# Patient Record
Sex: Male | Born: 1940 | Race: Black or African American | Hispanic: No | Marital: Married | Smoking: Never smoker
Health system: Southern US, Community
[De-identification: ages and names within clinical notes are randomized; demographics above are authoritative.]

## PROBLEM LIST (undated history)

## (undated) DIAGNOSIS — B351 Tinea unguium: Secondary | ICD-10-CM

## (undated) DIAGNOSIS — R011 Cardiac murmur, unspecified: Secondary | ICD-10-CM

## (undated) DIAGNOSIS — I10 Essential (primary) hypertension: Secondary | ICD-10-CM

## (undated) DIAGNOSIS — I351 Nonrheumatic aortic (valve) insufficiency: Secondary | ICD-10-CM

## (undated) DIAGNOSIS — E785 Hyperlipidemia, unspecified: Secondary | ICD-10-CM

## (undated) DIAGNOSIS — Z952 Presence of prosthetic heart valve: Secondary | ICD-10-CM

## (undated) DIAGNOSIS — K8020 Calculus of gallbladder without cholecystitis without obstruction: Secondary | ICD-10-CM

## (undated) DIAGNOSIS — R112 Nausea with vomiting, unspecified: Secondary | ICD-10-CM

## (undated) DIAGNOSIS — I7789 Other specified disorders of arteries and arterioles: Secondary | ICD-10-CM

## (undated) DIAGNOSIS — H409 Unspecified glaucoma: Secondary | ICD-10-CM

## (undated) DIAGNOSIS — Z9889 Other specified postprocedural states: Secondary | ICD-10-CM

## (undated) DIAGNOSIS — K219 Gastro-esophageal reflux disease without esophagitis: Secondary | ICD-10-CM

## (undated) DIAGNOSIS — K2970 Gastritis, unspecified, without bleeding: Secondary | ICD-10-CM

## (undated) DIAGNOSIS — I2510 Atherosclerotic heart disease of native coronary artery without angina pectoris: Secondary | ICD-10-CM

## (undated) DIAGNOSIS — G4730 Sleep apnea, unspecified: Secondary | ICD-10-CM

## (undated) DIAGNOSIS — E66813 Obesity, class 3: Secondary | ICD-10-CM

## (undated) DIAGNOSIS — I509 Heart failure, unspecified: Secondary | ICD-10-CM

## (undated) DIAGNOSIS — E876 Hypokalemia: Secondary | ICD-10-CM

## (undated) DIAGNOSIS — B9681 Helicobacter pylori [H. pylori] as the cause of diseases classified elsewhere: Secondary | ICD-10-CM

## (undated) DIAGNOSIS — D509 Iron deficiency anemia, unspecified: Secondary | ICD-10-CM

## (undated) HISTORY — PX: CHOLECYSTECTOMY: SHX55

## (undated) HISTORY — DX: Atherosclerotic heart disease of native coronary artery without angina pectoris: I25.10

## (undated) HISTORY — DX: Nonrheumatic aortic (valve) insufficiency: I35.1

## (undated) HISTORY — PX: PROSTATE BIOPSY: SHX241

## (undated) HISTORY — PX: CARDIAC CATHETERIZATION: SHX172

## (undated) HISTORY — DX: Hyperlipidemia, unspecified: E78.5

## (undated) HISTORY — DX: Obesity, class 3: E66.813

## (undated) HISTORY — DX: Helicobacter pylori (H. pylori) as the cause of diseases classified elsewhere: B96.81

## (undated) HISTORY — DX: Calculus of gallbladder without cholecystitis without obstruction: K80.20

## (undated) HISTORY — DX: Helicobacter pylori (H. pylori) as the cause of diseases classified elsewhere: K29.70

## (undated) HISTORY — DX: Other specified disorders of arteries and arterioles: I77.89

## (undated) HISTORY — DX: Heart failure, unspecified: I50.9

## (undated) HISTORY — DX: Tinea unguium: B35.1

## (undated) HISTORY — PX: MULTIPLE TOOTH EXTRACTIONS: SHX2053

## (undated) HISTORY — DX: Iron deficiency anemia, unspecified: D50.9

## (undated) HISTORY — DX: Sleep apnea, unspecified: G47.30

## (undated) HISTORY — PX: ADRENALECTOMY: SHX876

## (undated) HISTORY — DX: Unspecified glaucoma: H40.9

## (undated) HISTORY — DX: Hypokalemia: E87.6

## (undated) HISTORY — DX: Gastro-esophageal reflux disease without esophagitis: K21.9

## (undated) HISTORY — DX: Morbid (severe) obesity due to excess calories: E66.01

---

## 2007-08-30 ENCOUNTER — Encounter: Payer: Self-pay | Admitting: Internal Medicine

## 2008-03-04 ENCOUNTER — Encounter: Payer: Self-pay | Admitting: Internal Medicine

## 2008-09-28 ENCOUNTER — Encounter: Payer: Self-pay | Admitting: Internal Medicine

## 2008-10-09 ENCOUNTER — Encounter: Payer: Self-pay | Admitting: Internal Medicine

## 2008-10-10 ENCOUNTER — Emergency Department (HOSPITAL_BASED_OUTPATIENT_CLINIC_OR_DEPARTMENT_OTHER): Admission: EM | Admit: 2008-10-10 | Discharge: 2008-10-10 | Payer: Self-pay | Admitting: Emergency Medicine

## 2008-10-10 ENCOUNTER — Encounter (INDEPENDENT_AMBULATORY_CARE_PROVIDER_SITE_OTHER): Payer: Self-pay | Admitting: *Deleted

## 2008-10-10 ENCOUNTER — Ambulatory Visit: Payer: Self-pay | Admitting: Diagnostic Radiology

## 2008-11-04 ENCOUNTER — Encounter: Payer: Self-pay | Admitting: Internal Medicine

## 2008-11-04 HISTORY — PX: COLONOSCOPY W/ BIOPSIES AND POLYPECTOMY: SHX1376

## 2008-11-04 HISTORY — PX: UPPER GASTROINTESTINAL ENDOSCOPY: SHX188

## 2008-11-25 ENCOUNTER — Encounter: Payer: Self-pay | Admitting: Internal Medicine

## 2008-11-28 ENCOUNTER — Encounter: Payer: Self-pay | Admitting: Internal Medicine

## 2008-12-08 ENCOUNTER — Encounter: Payer: Self-pay | Admitting: Internal Medicine

## 2008-12-21 ENCOUNTER — Encounter: Payer: Self-pay | Admitting: Internal Medicine

## 2008-12-23 ENCOUNTER — Encounter: Payer: Self-pay | Admitting: Internal Medicine

## 2009-01-01 ENCOUNTER — Encounter: Payer: Self-pay | Admitting: Cardiology

## 2009-01-01 ENCOUNTER — Ambulatory Visit: Payer: Self-pay | Admitting: Internal Medicine

## 2009-01-01 DIAGNOSIS — R110 Nausea: Secondary | ICD-10-CM | POA: Insufficient documentation

## 2009-01-01 DIAGNOSIS — R634 Abnormal weight loss: Secondary | ICD-10-CM | POA: Insufficient documentation

## 2009-01-01 DIAGNOSIS — R609 Edema, unspecified: Secondary | ICD-10-CM | POA: Insufficient documentation

## 2009-01-01 DIAGNOSIS — R630 Anorexia: Secondary | ICD-10-CM | POA: Insufficient documentation

## 2009-01-01 DIAGNOSIS — R17 Unspecified jaundice: Secondary | ICD-10-CM | POA: Insufficient documentation

## 2009-01-01 LAB — CONVERTED CEMR LAB
ALT: 28 units/L (ref 0–53)
Albumin: 3.8 g/dL (ref 3.5–5.2)
Chloride: 106 meq/L (ref 96–112)
GFR calc non Af Amer: 65.04 mL/min (ref >60)
Hemoglobin, Urine: NEGATIVE
Ketones, ur: NEGATIVE mg/dL
Potassium: 3.7 meq/L (ref 3.5–5.1)
Sodium: 144 meq/L (ref 135–145)
Specific Gravity, Urine: 1.02 (ref 1.000–1.030)

## 2009-01-04 ENCOUNTER — Ambulatory Visit: Payer: Self-pay | Admitting: Cardiovascular Disease

## 2009-01-04 DIAGNOSIS — I509 Heart failure, unspecified: Secondary | ICD-10-CM | POA: Insufficient documentation

## 2009-01-04 DIAGNOSIS — R9431 Abnormal electrocardiogram [ECG] [EKG]: Secondary | ICD-10-CM | POA: Insufficient documentation

## 2009-01-13 ENCOUNTER — Ambulatory Visit: Payer: Self-pay

## 2009-01-13 ENCOUNTER — Encounter: Payer: Self-pay | Admitting: Cardiovascular Disease

## 2009-01-26 ENCOUNTER — Ambulatory Visit: Payer: Self-pay | Admitting: Cardiovascular Disease

## 2009-01-26 DIAGNOSIS — I712 Thoracic aortic aneurysm, without rupture, unspecified: Secondary | ICD-10-CM | POA: Insufficient documentation

## 2009-01-26 DIAGNOSIS — I429 Cardiomyopathy, unspecified: Secondary | ICD-10-CM | POA: Insufficient documentation

## 2009-01-26 DIAGNOSIS — I080 Rheumatic disorders of both mitral and aortic valves: Secondary | ICD-10-CM | POA: Insufficient documentation

## 2009-01-26 LAB — CONVERTED CEMR LAB
Basophils Absolute: 0.4 10*3/uL — ABNORMAL HIGH (ref 0.0–0.1)
CO2: 34 meq/L — ABNORMAL HIGH (ref 19–32)
Calcium: 9.1 mg/dL (ref 8.4–10.5)
Creatinine, Ser: 0.8 mg/dL (ref 0.4–1.5)
Eosinophils Absolute: 0.2 10*3/uL (ref 0.0–0.7)
Glucose, Bld: 98 mg/dL (ref 70–99)
HCT: 43 % (ref 39.0–52.0)
Hemoglobin: 14.6 g/dL (ref 13.0–17.0)
Lymphocytes Relative: 25.2 % (ref 12.0–46.0)
MCHC: 34 g/dL (ref 30.0–36.0)
Neutro Abs: 4.1 10*3/uL (ref 1.4–7.7)
Potassium: 3.3 meq/L — ABNORMAL LOW (ref 3.5–5.1)
Pro B Natriuretic peptide (BNP): 1898 pg/mL — ABNORMAL HIGH (ref 0.0–100.0)
Prothrombin Time: 12.7 s (ref 10.9–13.3)
RBC: 4.66 M/uL (ref 4.22–5.81)
Sodium: 143 meq/L (ref 135–145)
aPTT: 32.1 s — ABNORMAL HIGH (ref 21.7–28.8)

## 2009-02-01 ENCOUNTER — Ambulatory Visit: Payer: Self-pay | Admitting: Cardiovascular Disease

## 2009-02-01 LAB — CONVERTED CEMR LAB
Calcium: 9.1 mg/dL (ref 8.4–10.5)
Chloride: 106 meq/L (ref 96–112)
Potassium: 3.5 meq/L (ref 3.5–5.1)

## 2009-02-02 ENCOUNTER — Inpatient Hospital Stay (HOSPITAL_BASED_OUTPATIENT_CLINIC_OR_DEPARTMENT_OTHER): Admission: RE | Admit: 2009-02-02 | Discharge: 2009-02-02 | Payer: Self-pay | Admitting: Cardiovascular Disease

## 2009-02-02 ENCOUNTER — Ambulatory Visit: Payer: Self-pay | Admitting: Cardiovascular Disease

## 2009-02-16 ENCOUNTER — Ambulatory Visit: Payer: Self-pay | Admitting: Cardiology

## 2009-02-17 ENCOUNTER — Encounter: Payer: Self-pay | Admitting: Nurse Practitioner

## 2009-02-17 ENCOUNTER — Ambulatory Visit: Payer: Self-pay | Admitting: Internal Medicine

## 2009-02-17 DIAGNOSIS — I359 Nonrheumatic aortic valve disorder, unspecified: Secondary | ICD-10-CM | POA: Insufficient documentation

## 2009-02-17 DIAGNOSIS — I428 Other cardiomyopathies: Secondary | ICD-10-CM | POA: Insufficient documentation

## 2009-03-01 ENCOUNTER — Telehealth: Payer: Self-pay | Admitting: Internal Medicine

## 2009-03-11 ENCOUNTER — Ambulatory Visit: Payer: Self-pay | Admitting: Cardiovascular Disease

## 2009-03-11 DIAGNOSIS — I119 Hypertensive heart disease without heart failure: Secondary | ICD-10-CM | POA: Insufficient documentation

## 2009-03-25 ENCOUNTER — Telehealth: Payer: Self-pay | Admitting: Internal Medicine

## 2009-04-05 ENCOUNTER — Ambulatory Visit: Payer: Self-pay | Admitting: Internal Medicine

## 2009-04-05 DIAGNOSIS — R739 Hyperglycemia, unspecified: Secondary | ICD-10-CM | POA: Insufficient documentation

## 2009-04-05 DIAGNOSIS — I10 Essential (primary) hypertension: Secondary | ICD-10-CM | POA: Insufficient documentation

## 2009-05-31 ENCOUNTER — Ambulatory Visit: Payer: Self-pay | Admitting: Internal Medicine

## 2009-05-31 LAB — CONVERTED CEMR LAB
ALT: 13 units/L (ref 0–53)
Alkaline Phosphatase: 75 units/L (ref 39–117)
Bilirubin, Direct: 0.4 mg/dL — ABNORMAL HIGH (ref 0.0–0.3)
CO2: 28 meq/L (ref 19–32)
Chloride: 102 meq/L (ref 96–112)
Cholesterol: 149 mg/dL (ref 0–200)
Creatinine, Ser: 0.95 mg/dL (ref 0.40–1.50)
Glucose, Bld: 101 mg/dL — ABNORMAL HIGH (ref 70–99)
Indirect Bilirubin: 1.2 mg/dL — ABNORMAL HIGH (ref 0.0–0.9)
LDL Cholesterol: 88 mg/dL (ref 0–99)
Total Bilirubin: 1.6 mg/dL — ABNORMAL HIGH (ref 0.3–1.2)
Total CHOL/HDL Ratio: 2.9
Triglycerides: 50 mg/dL (ref <150)

## 2009-06-07 ENCOUNTER — Ambulatory Visit: Payer: Self-pay | Admitting: Internal Medicine

## 2009-06-07 DIAGNOSIS — J310 Chronic rhinitis: Secondary | ICD-10-CM | POA: Insufficient documentation

## 2009-06-29 ENCOUNTER — Encounter (INDEPENDENT_AMBULATORY_CARE_PROVIDER_SITE_OTHER): Payer: Self-pay | Admitting: *Deleted

## 2009-06-29 ENCOUNTER — Telehealth: Payer: Self-pay | Admitting: Internal Medicine

## 2009-07-01 ENCOUNTER — Encounter: Payer: Self-pay | Admitting: Cardiovascular Disease

## 2009-07-01 ENCOUNTER — Ambulatory Visit: Payer: Self-pay

## 2009-07-01 ENCOUNTER — Ambulatory Visit: Payer: Self-pay | Admitting: Cardiovascular Disease

## 2009-07-01 ENCOUNTER — Ambulatory Visit (HOSPITAL_COMMUNITY): Admission: RE | Admit: 2009-07-01 | Discharge: 2009-07-01 | Payer: Self-pay | Admitting: Cardiovascular Disease

## 2009-07-05 ENCOUNTER — Ambulatory Visit: Payer: Self-pay | Admitting: Cardiovascular Disease

## 2009-07-06 ENCOUNTER — Telehealth: Payer: Self-pay | Admitting: Cardiovascular Disease

## 2009-07-07 ENCOUNTER — Telehealth: Payer: Self-pay | Admitting: Cardiovascular Disease

## 2009-10-01 ENCOUNTER — Encounter (INDEPENDENT_AMBULATORY_CARE_PROVIDER_SITE_OTHER): Payer: Self-pay | Admitting: *Deleted

## 2009-10-01 ENCOUNTER — Telehealth: Payer: Self-pay | Admitting: Cardiovascular Disease

## 2009-11-01 ENCOUNTER — Ambulatory Visit: Payer: Self-pay | Admitting: Cardiovascular Disease

## 2009-11-03 LAB — CONVERTED CEMR LAB
BUN: 11 mg/dL (ref 6–23)
Calcium: 8.5 mg/dL (ref 8.4–10.5)
Creatinine, Ser: 1 mg/dL (ref 0.4–1.5)

## 2009-11-15 ENCOUNTER — Telehealth: Payer: Self-pay | Admitting: Cardiovascular Disease

## 2009-11-29 ENCOUNTER — Telehealth: Payer: Self-pay | Admitting: Internal Medicine

## 2009-11-29 ENCOUNTER — Ambulatory Visit: Payer: Self-pay | Admitting: Internal Medicine

## 2009-11-29 DIAGNOSIS — E66813 Obesity, class 3: Secondary | ICD-10-CM | POA: Insufficient documentation

## 2009-11-29 DIAGNOSIS — E669 Obesity, unspecified: Secondary | ICD-10-CM | POA: Insufficient documentation

## 2009-11-29 LAB — CONVERTED CEMR LAB: Hgb A1c MFr Bld: 5.6 % (ref 4.6–6.1)

## 2009-12-21 ENCOUNTER — Ambulatory Visit (HOSPITAL_COMMUNITY): Admission: RE | Admit: 2009-12-21 | Discharge: 2009-12-21 | Payer: Self-pay | Admitting: Cardiovascular Disease

## 2009-12-21 ENCOUNTER — Ambulatory Visit: Payer: Self-pay | Admitting: Cardiovascular Disease

## 2009-12-21 ENCOUNTER — Ambulatory Visit: Payer: Self-pay

## 2009-12-21 ENCOUNTER — Telehealth: Payer: Self-pay | Admitting: Adult Health

## 2009-12-21 ENCOUNTER — Ambulatory Visit: Payer: Self-pay | Admitting: Cardiology

## 2009-12-21 ENCOUNTER — Encounter (INDEPENDENT_AMBULATORY_CARE_PROVIDER_SITE_OTHER): Payer: Self-pay

## 2009-12-21 ENCOUNTER — Encounter: Payer: Self-pay | Admitting: Cardiovascular Disease

## 2009-12-22 ENCOUNTER — Ambulatory Visit: Payer: Self-pay | Admitting: Cardiovascular Disease

## 2009-12-22 LAB — CONVERTED CEMR LAB
Calcium: 9 mg/dL (ref 8.4–10.5)
Creatinine, Ser: 1 mg/dL (ref 0.4–1.5)
GFR calc non Af Amer: 95.61 mL/min (ref >60)

## 2009-12-23 ENCOUNTER — Encounter: Payer: Self-pay | Admitting: Cardiovascular Disease

## 2009-12-23 ENCOUNTER — Ambulatory Visit: Payer: Self-pay | Admitting: Internal Medicine

## 2009-12-23 LAB — CONVERTED CEMR LAB
BUN: 13 mg/dL (ref 6–23)
GFR calc non Af Amer: 85.65 mL/min (ref >60)
Potassium: 3.4 meq/L — ABNORMAL LOW (ref 3.5–5.1)
Sodium: 142 meq/L (ref 135–145)

## 2009-12-24 ENCOUNTER — Ambulatory Visit: Payer: Self-pay | Admitting: Cardiovascular Disease

## 2009-12-30 ENCOUNTER — Telehealth: Payer: Self-pay | Admitting: Cardiovascular Disease

## 2009-12-30 ENCOUNTER — Encounter: Payer: Self-pay | Admitting: Cardiovascular Disease

## 2009-12-30 LAB — CONVERTED CEMR LAB
BUN: 17 mg/dL (ref 6–23)
CO2: 25 meq/L (ref 19–32)
Chloride: 105 meq/L (ref 96–112)
Potassium: 4.3 meq/L (ref 3.5–5.1)
Sodium: 140 meq/L (ref 135–145)

## 2010-01-13 ENCOUNTER — Telehealth: Payer: Self-pay | Admitting: Internal Medicine

## 2010-01-18 ENCOUNTER — Ambulatory Visit: Payer: Self-pay | Admitting: Cardiovascular Disease

## 2010-01-18 DIAGNOSIS — I2510 Atherosclerotic heart disease of native coronary artery without angina pectoris: Secondary | ICD-10-CM | POA: Insufficient documentation

## 2010-01-21 ENCOUNTER — Ambulatory Visit: Payer: Self-pay | Admitting: Thoracic Surgery (Cardiothoracic Vascular Surgery)

## 2010-01-21 ENCOUNTER — Encounter: Payer: Self-pay | Admitting: Internal Medicine

## 2010-01-26 ENCOUNTER — Encounter: Payer: Self-pay | Admitting: Internal Medicine

## 2010-01-26 ENCOUNTER — Encounter: Admission: RE | Admit: 2010-01-26 | Discharge: 2010-04-26 | Payer: Self-pay | Admitting: Internal Medicine

## 2010-02-16 ENCOUNTER — Ambulatory Visit: Payer: Self-pay | Admitting: Internal Medicine

## 2010-02-16 LAB — CONVERTED CEMR LAB
BUN: 16 mg/dL (ref 6–23)
CO2: 29 meq/L (ref 19–32)
Chloride: 102 meq/L (ref 96–112)
Potassium: 3.8 meq/L (ref 3.5–5.3)

## 2010-02-23 ENCOUNTER — Telehealth: Payer: Self-pay | Admitting: Internal Medicine

## 2010-02-28 ENCOUNTER — Encounter: Payer: Self-pay | Admitting: Internal Medicine

## 2010-02-28 ENCOUNTER — Telehealth: Payer: Self-pay | Admitting: Internal Medicine

## 2010-03-21 ENCOUNTER — Telehealth: Payer: Self-pay | Admitting: Internal Medicine

## 2010-04-20 ENCOUNTER — Ambulatory Visit: Payer: Self-pay | Admitting: Family

## 2010-04-20 ENCOUNTER — Encounter: Payer: Self-pay | Admitting: Internal Medicine

## 2010-04-20 LAB — CONVERTED CEMR LAB
CO2: 30 meq/L (ref 19–32)
Chloride: 104 meq/L (ref 96–112)
Creatinine, Ser: 0.99 mg/dL (ref 0.40–1.50)

## 2010-05-11 ENCOUNTER — Ambulatory Visit: Payer: Self-pay | Admitting: Internal Medicine

## 2010-05-19 ENCOUNTER — Ambulatory Visit: Payer: Self-pay | Admitting: Cardiovascular Disease

## 2010-05-20 ENCOUNTER — Ambulatory Visit: Payer: Self-pay | Admitting: Internal Medicine

## 2010-05-20 DIAGNOSIS — J40 Bronchitis, not specified as acute or chronic: Secondary | ICD-10-CM | POA: Insufficient documentation

## 2010-06-16 ENCOUNTER — Encounter
Admission: RE | Admit: 2010-06-16 | Discharge: 2010-09-14 | Payer: Self-pay | Source: Home / Self Care | Attending: Internal Medicine | Admitting: Internal Medicine

## 2010-09-07 ENCOUNTER — Ambulatory Visit
Admission: RE | Admit: 2010-09-07 | Discharge: 2010-09-07 | Payer: Self-pay | Source: Home / Self Care | Attending: Internal Medicine | Admitting: Internal Medicine

## 2010-09-07 LAB — CONVERTED CEMR LAB
BUN: 15 mg/dL (ref 6–23)
CO2: 30 meq/L (ref 19–32)
Chloride: 102 meq/L (ref 96–112)
Creatinine, Ser: 1 mg/dL (ref 0.40–1.50)
HDL: 38 mg/dL — ABNORMAL LOW (ref >39)
LDL Cholesterol: 85 mg/dL (ref 0–99)
Potassium: 4 meq/L (ref 3.5–5.3)
Pro B Natriuretic peptide (BNP): 130.6 pg/mL — ABNORMAL HIGH (ref 0.0–100.0)
TSH: 1.739 microintl units/mL (ref 0.350–4.500)
Triglycerides: 66 mg/dL (ref <150)
VLDL: 13 mg/dL (ref 0–40)

## 2010-09-08 ENCOUNTER — Telehealth: Payer: Self-pay | Admitting: Internal Medicine

## 2010-09-11 ENCOUNTER — Encounter: Payer: Self-pay | Admitting: Cardiovascular Disease

## 2010-09-12 ENCOUNTER — Ambulatory Visit
Admission: RE | Admit: 2010-09-12 | Discharge: 2010-09-12 | Payer: Self-pay | Source: Home / Self Care | Attending: Internal Medicine | Admitting: Internal Medicine

## 2010-09-12 DIAGNOSIS — R972 Elevated prostate specific antigen [PSA]: Secondary | ICD-10-CM | POA: Insufficient documentation

## 2010-09-20 ENCOUNTER — Encounter: Payer: Self-pay | Admitting: Internal Medicine

## 2010-09-20 NOTE — Progress Notes (Signed)
Summary: refill--Klor-Con  Phone Note Refill Request Message from:  Fax from [facility] [phone] on February 27, 2010 8:27 PM  Refills Requested: Medication #1:  KLOR-CON M20 20 MEQ CR-TABS 3  tabs by mouth  two times a day   Dosage confirmed as above?Dosage Confirmed   Supply Requested: 1 month   Last Refilled: 02/11/2010 Next Appointment Scheduled: 04/20/10--Rikki Trosper Initial call taken by: Mervin Kung CMA (AAMA),  February 28, 2010 2:56 PM    Prescriptions: KLOR-CON M20 20 MEQ CR-TABS (POTASSIUM CHLORIDE CRYS CR) 3  tabs by mouth  two times a day  #240 x 1   Entered by:   Mervin Kung CMA (AAMA)   Authorized by:   D. Thomos Lemons DO   Signed by:   Mervin Kung CMA (AAMA) on 02/28/2010   Method used:   Electronically to        [facility]  [facility] [phone]* (retail)       [address] [place], [place]  [address]       Ph: [phone]       Fax: [phone]   RxID:   [id number]

## 2010-09-20 NOTE — Letter (Signed)
Summary: Appointment - Cardiac MRI  [facility]     [place], [place]    Phone:   Fax:       October 01, 2009 MRN: [id number]   [facility] [place] [address] [place], [place]  [address]   Dear David Parsons,   We have scheduled the above patient for an appointment for a Cardiac MRI on 11-12-2009 at 9:00a.m.  Please refer to the below information for the location and instructions for this test:  Location:     [facility]       [address]       [place], [place]  [address] Instructions:    Wilmon Arms at [facility] Outpatient Registration 45 minutes prior to your appointment time.  This will ensure you are in the Radiology Department 30 minutes prior to your appointment.    There are no restrictions for this test you may eat and take medications as usual.  If you need to reschedule this appointment please call at the number listed above.    Sincerely,     Lorne Skeens  [facility] Scheduling Team

## 2010-09-20 NOTE — Assessment & Plan Note (Signed)
Summary: congestion/hea   Vital Signs:  Patient profile:   70 year old male O2 Sat:      98 % on Room air Temp:     97.7 degrees F oral Pulse rate:   77 / minute Pulse rhythm:   regular Resp:     20 per minute BP sitting:   132 / 60  (right arm) Cuff size:   large  Vitals Entered By: Glendell Docker CMA (May 20, 2010 10:53 AM)  O2 Flow:  Room air CC: Congestion Is Patient Diabetic? No Pain Assessment Patient in pain? no      Comments c/o head congestion and nasal draingage into throat , productive cough  clear to yellow in color, Tylenol and Chloricidin  with no relief, onset Saturday of last week, denies fever, no nausea or vomiting,.   Primary Care Marigene Erler:  Dondra Spry DO  CC:  Congestion.  History of Present Illness: 70 y/o AA male c/o cough onset 1 week head cold symptoms nasal congestion bed rest last night - cough started.  post nasal gtt sputum is clear, no purulent no fever or SOB  Allergies (verified): No Known Drug Allergies  Past History:  Past Medical History: hypokalemia Hypertension   Sleep apnea-occasionally wears CPAP at night gallstones s/p cholecystectomy  GERD Morbid obesity H. pylori gastritis (Tx Pylera 4/10) Aortic insufficiency-moderate Enlarged aortic root      a. 02/16/09 Chest CT:                The aortic root has a diameter measuring 4.6 cm, image 64 of the coronal series.  The ascending                            thoracic aorta has a diameter of 3.8 cm, image 65 of the coronal series.  At the level of the aortic arch                  the thoracic aorta has a maximum diameter of 3.2 cm.  The descending thoracic aorta has a maximum                   diameter of 3.2 cm.  There is no evidence for aortic dissection.     b. Repeat CT chest 12/23/09: Aortic root 5.5 cm, ascending aorta 4.0cm.  Dilated Cardiomyopathy/NICM      a. 01/2009 - Cath - nonobs. dzs.  EF 35%. Normal on last echo. Chronic Syst. 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   Ears:  R ear normal and L ear normal.   Mouth:  pharyngeal erythema and postnasal drip.   Neck:  No deformities, masses, or tenderness noted. Lungs:  normal respiratory effort.  faint coarse breath sounds bilaterally Heart:  normal rate, regular rhythm, and no gallop.     Impression & Recommendations:  Problem # 1:  BRONCHITIS (ICD-490) 70 y/o with early bronchitis.  take abx as directed.  Patient advised to call office if symptoms persist or worsen.  His updated medication list for this problem includes:    Cefuroxime Axetil 500 Mg Tabs (Cefuroxime axetil) ..... One by mouth two times a day  Complete Medication List: 1)  Klor-con M20 20 Meq Cr-tabs (Potassium chloride crys cr) .... 3  tabs by mouth  two times a day 2)  Hydralazine Hcl 50 Mg Tabs (Hydralazine hcl) .... Take 2 tabs every morning and 1 tab at bedtime 3)  Micardis Hct 80-25 Mg Tabs (Telmisartan-hctz) .Marland Kitchen.. 1 tablet by mouth once daily 4)  Furosemide 40 Mg Tabs (Furosemide) .Marland Kitchen.. 1 by mouth q am 5)  Centrum Ultra Mens Tabs (Multiple vitamins-minerals) .Marland Kitchen.. 1 tab once daily 6)  Aspirin 81 Mg Tbec (Aspirin) .... Take one tablet by mouth daily 7)  Carvedilol 25 Mg Tabs (Carvedilol) .Marland Kitchen.. 1  tab two times a day 8)  Cefuroxime Axetil 500 Mg Tabs (Cefuroxime axetil) .... One by mouth two times a day  Patient Instructions: 1)  Call our office if your symptoms do not  improve or gets worse. Prescriptions: CEFUROXIME AXETIL 500 MG TABS (CEFUROXIME AXETIL) one by mouth two times a day  #14 x 0   Entered and Authorized by:   D. Thomos Lemons DO   Signed by:   D. Thomos Lemons DO on 05/20/2010   Method used:   Electronically to        [facility]  [facility] [phone]* (retail)       [address] [place], [place]  [address]       Ph: [phone]       Fax:  [phone]   RxID:   [id number]   Current Allergies (reviewed today): No known allergies

## 2010-09-20 NOTE — Consult Note (Signed)
Summary: [facility]  [facility]   Imported By: Lanelle Bal 02/07/2010 11:37:29  _____________________________________________________________________  External Attachment:    Type:   Image     Comment:   External Document

## 2010-09-20 NOTE — Progress Notes (Signed)
Summary: can he get shingles shot anywhere in [place]   Phone Note Call from Patient Call back at [phone]   Caller: pt wife Steward Drone Call For: yoo  Summary of Call: [facility] told patient to check with [place] to see if he could get his shingles shot somewhere in the system as getting it at the drugstore it would cost $169.00  Initial call taken by: Roselle Locus,  November 29, 2009 9:36 AM  Follow-up for Phone Call        call placed to patient regardingg the vaccinatiion he states the pharmacy informed him that it would cost him $169 to have the vaccination given there, he would like to know if he could have the vaccine given at another [facility] location Follow-up by: Glendell Docker CMA,  November 29, 2009 11:00 AM  Additional Follow-up for Phone Call Additional follow up Details #1::        call placed to [facility] office to check protocol for shingles vaccination. I was informed by Jennifer,paitent would need to check with insurance company for coverage.  Call was returned to patient, he was advised to check with his insurance company for coverage, if vaccine is covered he was advised to call [facility] to had administered there Additional Follow-up by: Glendell Docker CMA,  November 29, 2009 11:09 AM

## 2010-09-20 NOTE — Miscellaneous (Signed)
Summary: med change  Clinical Lists Changes  Medications: Changed medication from KLOR-CON M20 20 MEQ CR-TABS (POTASSIUM CHLORIDE CRYS CR) 2 tabs by mouth  two times a day to KLOR-CON M20 20 MEQ CR-TABS (POTASSIUM CHLORIDE CRYS CR) 3  tabs by mouth  two times a day

## 2010-09-20 NOTE — Progress Notes (Signed)
Summary: returning call  Phone Note Call from Patient Call back at [phone]   Caller: Patient Reason for Call: Talk to Nurse Summary of Call: returning call  Initial call taken by: [name],  Dec 30, 2009 8:17 AM  Follow-up for Phone Call        Spoke with pt. He cannot do TEE on 5/17 but is available to have done on 5/18. Will arrange for 5/18 Dossie Arbour, RN, BSN  Dec 30, 2009 9:45 AM TEE scheduled for 01/05/10 with Dr. Jens Som. I spoke with wife and gave her this information. TEE instructions given verbally to wife and she verbalizes understanding. Will mail instructions to pt today. Follow-up by: Dossie Arbour, RN, BSN,  Dec 30, 2009 11:16 AM     Appended Document: returning call Received call from Dr.Brilyn Tuller that pt will not need TEE. Pt. notified. He will keep scheduled follow up appt with Dr. Clifton James on Jan 18, 2010. Clydie Braun in endoscopy notified and she will cancel TEE

## 2010-09-20 NOTE — Progress Notes (Signed)
Summary: questions about cancelled cardiac mri  Phone Note Call from Patient Call back at Home Phone [phone]   Caller: Patient Reason for Call: Talk to Nurse Summary of Call: Patient was scheduled for Cardiac MRI.  Was cancelled by the hospital.  Patient is unsure what happened and would like to know what the next step should be. Initial call taken by: Burnard Leigh,  November 15, 2009 10:26 AM  Follow-up for Phone Call        Spoke with radiology at [facility] ([facility] Due to his size pt was unable to fit in scanner and procedure was cancelled.  Dossie Arbour, RN, BSN  November 15, 2009 1:01 PM Called pt and let him know I would discuss with Dr. Clifton James and call him back in next few days with Dr. Gibson Ramp recommendations on how to proceed from here. Dossie Arbour, RN, BSN  November 15, 2009 5:26 PM   Additional Follow-up for Phone Call Additional follow up Details #1::        Unfortunate that patient is too large for MRI scanner. Can we set him up for an echo in the next few weeks and I can see him back in the office after the echo. thanks, cdm Additional Follow-up by: Verne Carrow, MD,  November 18, 2009 4:04 PM    Additional Follow-up for Phone Call Additional follow up Details #2::    left message to call back Dossie Arbour, RN, BSN  November 18, 2009 4:51 PM   returning call, [phone], [name]  November 19, 2009 8:58 AM  Spoke with pt. I let pt. know  Md would like to scheduled a 2-D echo in a few weeks.( Echo order is in EMR)  Dr. Clifton James will see him  the same day after echo is done. The  Scheduler will call pt with date and time. Okay with pt. Ollen Gross, RN, BSN  November 19, 2009 9:42 AM   Additional Follow-up for Phone Call Additional follow up Details #3:: Details for Additional Follow-up Action Taken: Agree Additional Follow-up by: Verne Carrow, MD,  November 22, 2009 12:14 PM

## 2010-09-20 NOTE — Assessment & Plan Note (Signed)
Summary: 6 month follow up/mhf   Vital Signs:  Patient profile:   70 year old male Height:      74 inches Weight:      346.50 pounds BMI:     44.65 O2 Sat:      97 % on Room air Temp:     98.0 degrees F oral Pulse rate:   91 / minute Pulse rhythm:   irregular Resp:     18 per minute BP sitting:   140 / 70  (right arm) Cuff size:   large  Vitals Entered By: Glendell Docker CMA (November 29, 2009 8:03 AM)  O2 Flow:  Room air CC: Rm 2- 6 Month Follow up disease managment Comments May 3 rd for Echo ,  no concerns   Primary Care Provider:  DThomos Lemons DO  CC:  Rm 2- 6 Month Follow up disease managment.  History of Present Illness:  70 yo AAM with non-ischemic CM, moderate AI, non-obstructive CAD , dilated aortic root and morbid obesity for followup  pt still struggling with obesity he gained wt since prev visit   Preventive Screening-Counseling & Management  Alcohol-Tobacco     Smoking Status: never  Allergies: No Known Drug Allergies  Past History:  Past Medical History: hypokalemia Hypertension  Sleep apnea-occasionally wears CPAP at night gallstones s/p cholecystectomy GERD Morbid obesity H. pylori gastritis (Tx Pylera 4/10) Aortic insufficiency-moderate Enlarged aortic root      a. 02/16/09 Chest CT:                The aortic root has a diameter measuring 4.6 cm, image 64 of the coronal series.  The ascending                            thoracic aorta has a diameter of 3.8 cm, image 65 of the coronal series.  At the level of the aortic arch                  the thoracic aorta has a maximum diameter of 3.2 cm.  The descending thoracic aorta has a maximum                   diameter of 3.2 cm.  There is no evidence for aortic dissection. Dilated Cardiomyopathy/NICM      a. 01/2009 - Cath - nonobs. dzs.  EF 35%. Chronic Syst. 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  Alcohol Use - no Illicit Drug Use - no Occupation: [employer]  Married,   42 years  no children    Physical Exam  General:  alert and overweight-appearing.   Neck:  No deformities, masses, or tenderness noted. Lungs:  normal respiratory effort and normal breath sounds.   Heart:  holosystolic murmur, normal rate, regular rhythm, and no gallop.   Extremities:  trace left pedal edema and trace right pedal edema.   Neurologic:  cranial nerves II-XII intact and gait normal.   Psych:  normally interactive, good eye contact, not anxious appearing, and not depressed appearing.     Impression & Recommendations:  Problem # 1:  OBESITY, MORBID (ICD-278.01) Refer for additional nutrition couseling / meal planning  Orders: Nutrition Referral (Nutrition)  Ht: 74 (11/29/2009)   Wt: 346.50 (11/29/2009)   BMI: 44.65 (11/29/2009)  Problem # 2:  OTHER ABNORMAL GLUCOSE (ICD-790.29) monitor A1c  Orders: T- Hemoglobin A1C (33295-18841)  Problem # 3:  HYPERTENSION (ICD-401.9) stable. Maintain current medication regimen.  His updated medication list for this problem includes:    Hydralazine Hcl 50 Mg Tabs (Hydralazine hcl) .Marland Kitchen... Take one tablet by mouth three times a day    Micardis Hct 80-25 Mg Tabs (Telmisartan-hctz) .Marland Kitchen... 1 tablet by mouth once daily    Furosemide 40 Mg Tabs (Furosemide) .Marland Kitchen... 1 by mouth q am    Carvedilol 25 Mg Tabs (Carvedilol) .Marland Kitchen... 1/2 tab two times a day  BP today: 140/70 Prior BP: 148/60 (07/05/2009)  Labs Reviewed: K+: 3.7 (11/01/2009) Creat: : 1.0 (11/01/2009)   Chol: 149  (05/31/2009)   HDL: 51 (05/31/2009)   LDL: 88 (05/31/2009)   TG: 50 (05/31/2009)  Complete Medication List: 1)  Klor-con M20 20 Meq Cr-tabs (Potassium chloride crys cr) .... 2 tabs by mouth  two times a day 2)  Hydralazine Hcl 50 Mg Tabs (Hydralazine hcl) .... Take one tablet by mouth three times a day 3)  Micardis Hct 80-25 Mg Tabs (Telmisartan-hctz) .Marland Kitchen.. 1 tablet by mouth once daily 4)  Furosemide 40 Mg Tabs (Furosemide) .Marland Kitchen.. 1 by mouth q am 5)  Centrum Ultra Mens Tabs (Multiple vitamins-minerals) .Marland Kitchen.. 1 tab once daily 6)  Aspirin 81 Mg Tbec (Aspirin) .... Take one tablet by mouth daily 7)  Carvedilol 25 Mg Tabs (Carvedilol) .... 1/2 tab two times a day 8)  Diazepam 5 Mg Tabs (Diazepam) .... Take 1 tablet am of mri and 1 tablet upon arrival to mri 9)  Zostavax 66063 Unt/0.94ml Solr (Zoster vaccine live) .... Administer vaccine x 1  Patient Instructions: 1)  Please schedule a follow-up appointment in 2 months. Prescriptions: ZOSTAVAX 01601 UNT/0.65ML SOLR (ZOSTER VACCINE LIVE) administer vaccine x 1  #1 x 0   Entered and Authorized by:   D. Thomos Lemons DO   Signed by:   D. Thomos Lemons DO on 11/29/2009   Method used:   Print then Give to Patient   RxID:   [id number]

## 2010-09-20 NOTE — Miscellaneous (Signed)
Summary: IV for Definity Echo  Clinical Lists Changes     IV 22 G (R) hand for Definity Echo. Terrence Wishon,RN.

## 2010-09-20 NOTE — Procedures (Signed)
Summary: [facility]  [facility]   Imported By: Sherian Rein 01/25/2009 14:40:39  _____________________________________________________________________  External Attachment:    Type:   Image     Comment:   External Document

## 2010-09-20 NOTE — Progress Notes (Signed)
Summary: Severe Skin Irritation  Phone Note Call from Patient Call back at [phone]   Caller: Spouse-Brenda  Call For: D. Thomos Lemons DO Summary of Call: patients wife called and left voice message stating patient has severe skin irritation. She state they  were out of town and the patient was bite by something. He has tried hydrocortiosone cream with no relief.  Call was returned to patient. He states that he has several mosquito bites all over his body and head, and has tried hydrocortisone cream with no relief. He states he is bothered by severe irritation on his arm and leg and would like to know if there is something else that he could try to get some relief. Initial call taken by: Glendell Docker CMA,  February 23, 2010 1:36 PM  Follow-up for Phone Call        He can try Benadryl 25mg  by mouth every 6 hours as needed.  Needs to be seen in office if this does not improve symptoms, or if swelling or redness surrounding "bites".   Follow-up by: Lemont Fillers FNP,  February 23, 2010 1:46 PM  Additional Follow-up for Phone Call Additional follow up Details #1::        call returne to patient at [phone], no answer, detailed voice message left informing patient per Sandford Craze instructions Additional Follow-up by: Glendell Docker CMA,  February 23, 2010 4:22 PM

## 2010-09-20 NOTE — Assessment & Plan Note (Signed)
Summary: 2 MONTH FOLLOW UP/MHF lm home machine--rm5   Vital Signs:  Patient profile:   70 year old male Height:      74 inches Weight:      347 pounds BMI:     44.71 Temp:     98.0 degrees F oral Pulse rate:   90 / minute Pulse rhythm:   regular Resp:     18 per minute BP sitting:   148 / 68  (right arm) Cuff size:   large  Vitals Entered By: Mervin Kung CMA David Parsons) (April 20, 2010 8:10 AM) CC: Room 5   2 month follow up. Pt states he can hear a crackle in his ear when he chews x 2 weeks., CHF Management Is Patient Diabetic? No Pain Assessment Patient in pain? no        Primary Care Provider:  Dondra Spry DO  CC:  Room 5   2 month follow up. Pt states he can hear a crackle in his ear when he chews x 2 weeks. and CHF Management.  History of Present Illness: David Parsons is a 70 year old male who presents today for his two month follow up visit.    1)CHF-  Reports that he has been compliant with his medications.  Taking one tablet of furosemide a day.    CHF History:      He complains of peripheral edema, but denies headache, chest pain, palpitations, dyspnea with exertion, orthopnea, visual symptoms, and neurologic problems.  Daily weights are being checked.  Other comments include: Notes that he develops ankle edema with prolonged sitting.  .     Allergies (verified): No Known Drug Allergies  Past History:  Past Medical History: Last updated: 02/16/2010 hypokalemia Hypertension   Sleep apnea-occasionally wears CPAP at night gallstones s/p cholecystectomy GERD Morbid obesity H. pylori gastritis (Tx Pylera 4/10) Aortic insufficiency-moderate Enlarged aortic root      a. 02/16/09 Chest CT:                The aortic root has a diameter measuring 4.6 cm, image 64 of the coronal series.  The ascending                            thoracic aorta has a diameter of 3.8 cm, image 65 of the coronal series.  At the level of the aortic arch                  the thoracic  aorta has a maximum diameter of 3.2 cm.  The descending thoracic aorta has a maximum                   diameter of 3.2 cm.  There is no evidence for aortic dissection.     b. Repeat CT chest 12/23/09: Aortic root 5.5 cm, ascending aorta 4.0cm.  Dilated Cardiomyopathy/NICM      a. 01/2009 - Cath - nonobs. dzs.  EF 35%. Normal on last echo. Chronic Syst. 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 No apparent alopecia or balding. Lungs:  Normal respiratory effort, chest expands symmetrically. Lungs are  clear to auscultation, no crackles or wheezes. Heart:  Normal rate and regular rhythm. S1 and S2 normal without gallop, murmur, click, rub or other extra sounds. Extremities:  1+ left pedal edema and 1+ right pedal edema.     Impression & Recommendations:  Problem # 1:  CONGESTIVE HEART FAILURE, UNSPECIFIED (ICD-428.0) Assessment Unchanged Weight remains stable, denies DOE.  Will plan to continue current meds.  Check BMET to f/u on potassium and renal fxn. His updated medication list for this problem includes:    Micardis Hct 80-25 Mg Tabs (Telmisartan-hctz) .Marland Kitchen... 1 tablet by mouth once daily    Furosemide 40 Mg Tabs (Furosemide) .Marland Kitchen... 1 by mouth q am    Aspirin 81 Mg Tbec (Aspirin) .Marland Kitchen... Take one tablet by mouth daily    Carvedilol 25 Mg Tabs (Carvedilol) .Marland Kitchen... 1  tab two times a day  Problem # 2:  HYPERTENSION (ICD-401.9) Assessment: Unchanged BP relatively unchanged.  Continue current meds for now.  If SBP remains greater than 140 next visit, consider adding additional BP med.   His updated medication list for this problem includes:    Hydralazine Hcl 50 Mg Tabs (Hydralazine hcl) .Marland Kitchen... Take  2 tabs every morning and 1 tab at bedtime    Micardis Hct 80-25 Mg Tabs (Telmisartan-hctz) .Marland Kitchen... 1 tablet by mouth once daily    Furosemide 40 Mg Tabs (Furosemide) .Marland Kitchen... 1 by mouth q am    Carvedilol 25 Mg Tabs (Carvedilol) .Marland Kitchen... 1  tab two times a day  BP today: 148/68 Prior BP: 142/60 (02/16/2010)  Labs Reviewed: K+: 3.8 (02/16/2010) Creat: : 0.97 (02/16/2010)   Chol: 149 (05/31/2009)   HDL: 51 (05/31/2009)   LDL: 88 (05/31/2009)   TG: 50 (05/31/2009)  Complete Medication List: 1)  Klor-con M20 20 Meq Cr-tabs (Potassium chloride crys cr) .... 3  tabs by mouth  two times a day 2)  Hydralazine Hcl 50 Mg Tabs (Hydralazine hcl) .... Take 2 tabs every morning and 1 tab at bedtime 3)  Micardis Hct 80-25 Mg Tabs (Telmisartan-hctz) .Marland Kitchen.. 1 tablet by mouth once daily 4)  Furosemide 40 Mg Tabs (Furosemide) .Marland Kitchen.. 1 by mouth q am 5)  Centrum Ultra Mens Tabs (Multiple vitamins-minerals) .Marland Kitchen.. 1 tab once daily 6)  Aspirin 81 Mg Tbec (Aspirin) .... Take one tablet by mouth daily 7)  Carvedilol 25 Mg Tabs (Carvedilol) .Marland Kitchen.. 1  tab two times a day  Other Orders: TLB-BMP (Basic Metabolic Panel-BMET) (80048-METABOL)  CHF Assessment/Plan:      The patient's current weight is 347 pounds.  His previous weight was 348.50 pounds.    Patient Instructions: 1)  Please complete your lab work on the first floor.  2)  Follow up with Dr. Artist Pais in 2 months- sooner if problems or concerns. Prescriptions: MICARDIS HCT 80-25 MG TABS (TELMISARTAN-HCTZ) 1 tablet by mouth once daily  #30 x 2   Entered and Authorized by:   Lemont Fillers FNP   Signed by:   Lemont Fillers FNP on 04/20/2010   Method used:   Electronically to        [facility] [phone]* (retail)       [address] [place], [place]  [address]       Ph: [phone]       Fax: [phone]   RxID:   [id number]   Current Allergies (reviewed today): No known allergies

## 2010-09-20 NOTE — Letter (Signed)
Summary: TEE Instructions  [facility], Main Office  [address]   [place], [place] [address]   Phone: [phone]  Fax: [phone]      TEE Instructions    You are scheduled for a TEE on  Jan 05, 2010 with Dr. Jens Som. ________________________.  Please arrive at the [facility] at 10:15 a.m. Marland Kitchen on the day of your procedure.  1)   Diet:       Nothing to eat or drink after midnight except your medications with        a sip of water.     2)  Must have a responsible person to drive you home.  3)   Bring your current insurance cards and current list of all your medications.   *Special Note:  Every effort is made to have your procedure done on time.  Occasionally there are emergencies that present themselves at the hospital that may cause delays.  Please be patient if a delay does occur.  *If you have any questions after you get home, please call the office at [phone].

## 2010-09-20 NOTE — Assessment & Plan Note (Signed)
Summary: 4 month rov/sl   Visit Type:  4 mo f/u Primary David Parsons:  David Spry DO  CC:  no cardiac complaints today.  History of Present Illness: 70 yo AAM with history of NICM, dilated aortic root, moderate AI, non-obstrucitve CAD, morbid obesity, obstructive sleep apnea, HTN and GERD who was  referred in May 2010 for SOB, abdominal swelling and LE edema. He has since been diagnosed with a non-ischemic CM, moderate AI, non-obstructive CAD and a dilated aortic root. He has undergone a right and left heart cath with non-obstructive coronary artery disease. A CT angiogram was performed in June 2010 to look closely at his aortic root and showed moderate dilation of the aortic root  (root 4.6 cm, ascending aorta 3.8cm). It is felt that his aortic insufficiency is likely related to the aortic root dilation. His LV is mildly dilated but LV function is now normal. Echo in the spring of 2011 showed enlarging aortic root. I planned on performing an MRI to look at his valve, degree of AI and size of root but he was too large for the scanner. Repeat CT angiogram chest 5/05/11showed enlarged aortic root  at 5.5cm with ascending aorta 4.0cm. Because of this, I referred him to Dr. Cornelius Moras with [facility]. Dr. Cornelius Moras recommended waiting for surgery until his AI worsened or his root enlarged more.    He is here today for follow up.  He has had no chest pain, SOB, DOE, palpitations, near syncope or syncope. He has been very active and walks several miles daily. He has been taking all of his medications. No complaints.    Current Medications (verified): 1)  Klor-Con M20 20 Meq Cr-Tabs (Potassium Chloride Crys Cr) .... 3  Tabs By Mouth  Two Times A Day 2)  Hydralazine Hcl 50 Mg Tabs (Hydralazine Hcl) .... Take 2 Tabs Every Morning and 1 Tab At Bedtime 3)  Micardis Hct 80-25 Mg Tabs (Telmisartan-Hctz) .Marland Kitchen.. 1 Tablet By Mouth Once Daily 4)  Furosemide 40 Mg  Tabs (Furosemide) .Marland Kitchen.. 1 By Mouth Q Am 5)  Centrum Ultra Mens  Tabs  (Multiple Vitamins-Minerals) .Marland Kitchen.. 1 Tab Once Daily 6)  Aspirin 81 Mg Tbec (Aspirin) .... Take One Tablet By Mouth Daily 7)  Carvedilol 25 Mg Tabs (Carvedilol) .Marland Kitchen.. 1  Tab Two Times A Day  Allergies (verified): No Known Drug Allergies  Past History:  Past Medical History: Reviewed history from 02/16/2010 and no changes required. hypokalemia Hypertension   Sleep apnea-occasionally wears CPAP at night gallstones s/p cholecystectomy GERD Morbid obesity H. pylori gastritis (Tx Pylera 4/10) Aortic insufficiency-moderate Enlarged aortic root      a. 02/16/09 Chest CT:                The aortic root has a diameter measuring 4.6 cm, image 64 of the coronal series.  The ascending                            thoracic aorta has a diameter of 3.8 cm, image 65 of the coronal series.  At the level of the aortic arch                  the thoracic aorta has a maximum diameter of 3.2 cm.  The descending thoracic aorta has a maximum                   diameter of 3.2 cm.  There is no evidence for  aortic dissection.     b. Repeat CT chest 12/23/09: Aortic root 5.5 cm, ascending aorta 4.0cm.  Dilated Cardiomyopathy/NICM      a. 01/2009 - Cath - nonobs. dzs.  EF 35%. Normal on last echo. Chronic Syst. CHF Non-obstructive CAD     Social History: Reviewed history from 02/16/2010 and no changes required. Patient has never smoked.  Alcohol Use - no Illicit Drug Use - no  Occupation: [employer]  Married,   42 years  no children    Review of Systems  The patient denies fatigue, malaise, fever, weight gain/loss, vision loss, decreased hearing, hoarseness, chest pain, palpitations, shortness of breath, prolonged cough, wheezing, sleep apnea, coughing up blood, abdominal pain, blood in stool, nausea, vomiting, diarrhea, heartburn, incontinence, blood in urine, muscle weakness, joint pain, leg swelling, rash, skin lesions, headache, fainting, dizziness, depression, anxiety, enlarged lymph  nodes, easy bruising or bleeding, and environmental allergies.    Vital Signs:  Patient profile:   70 year old male Height:      74 inches Weight:      345 pounds BMI:     44.46 Pulse rate:   86 / minute Pulse rhythm:   regular BP sitting:   142 / 76  (left arm) Cuff size:   large  Vitals Entered By: Danielle Rankin, CMA (May 19, 2010 9:08 AM)  Physical Exam  General:  General: Well developed, well nourished, NAD HEENT: OP clear, mucus membranes moist SKIN: warm, dry Neuro: No focal deficits Musculoskeletal: Muscle strength 5/5 all ext Psychiatric: Mood and affect normal Neck: No JVD, no carotid bruits, no thyromegaly, no lymphadenopathy. Lungs:Clear bilaterally, no wheezes, rhonci, crackles CV: RRR with soft diastolic  murmur, gallops rubs Abdomen: soft, NT, ND, BS present Extremities:Trace  lower ext  edema, pulses 2+.    Impression & Recommendations:  Problem # 1:  AORTIC INSUFFICIENCY, MODERATE (ICD-424.1)  Repeat echo in May.   His updated medication list for this problem includes:    Micardis Hct 80-25 Mg Tabs (Telmisartan-hctz) .Marland Kitchen... 1 tablet by mouth once daily    Furosemide 40 Mg Tabs (Furosemide) .Marland Kitchen... 1 by mouth q am    Carvedilol 25 Mg Tabs (Carvedilol) .Marland Kitchen... 1  tab two times a day  His updated medication list for this problem includes:    Micardis Hct 80-25 Mg Tabs (Telmisartan-hctz) .Marland Kitchen... 1 tablet by mouth once daily    Furosemide 40 Mg Tabs (Furosemide) .Marland Kitchen... 1 by mouth q am    Carvedilol 25 Mg Tabs (Carvedilol) .Marland Kitchen... 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 Plans to repeat CT angiogram thoracic aorta in May.   Orders: CT Scan  (CT Scan)  Problem # 3:  HYPERTENSION (ICD-401.9)  BP well controlled for him.   NO changes.   His updated medication list for this problem includes:    Hydralazine Hcl 50 Mg Tabs (Hydralazine hcl) .Marland Kitchen... Take 2 tabs every morning and 1  tab at bedtime    Micardis Hct 80-25 Mg Tabs (Telmisartan-hctz) .Marland Kitchen... 1 tablet by mouth once daily    Furosemide 40 Mg Tabs (Furosemide) .Marland Kitchen... 1 by mouth q am    Aspirin 81 Mg Tbec (Aspirin) .Marland Kitchen... Take one tablet by mouth daily    Carvedilol 25 Mg Tabs (Carvedilol) .Marland Kitchen... 1  tab two times a day  His updated medication list for this problem includes:  Hydralazine Hcl 50 Mg Tabs (Hydralazine hcl) .Marland Kitchen... Take 2 tabs every morning and 1 tab at bedtime    Micardis Hct 80-25 Mg Tabs (Telmisartan-hctz) .Marland Kitchen... 1 tablet by mouth once daily    Furosemide 40 Mg Tabs (Furosemide) .Marland Kitchen... 1 by mouth q am    Aspirin 81 Mg Tbec (Aspirin) .Marland Kitchen... Take one tablet by mouth daily    Carvedilol 25 Mg Tabs (Carvedilol) .Marland Kitchen... 1  tab two times a day  Patient Instructions: 1)  Your physician recommends that you schedule a follow-up appointment in 8 months. 2)  Your physician recommends that you continue on your current medications as directed. Please refer to the Current Medication list given to you today. 3)  Non-Cardiac CT Angiography (CTA), is a special type of CT scan that uses a computer to produce multi-dimensional views of major blood vessels throughout the body. In CT angiography, a contrast material is injected through an IV to help visualize the blood vessels. To be done in May. 4)  Your physician has requested that you have an echocardiogram.  Echocardiography is a painless test that uses sound waves to create images of your heart. It provides your doctor with information about the size and shape of your heart and how well your heart's chambers and valves are working.  This procedure takes approximately one hour. There are no restrictions for this procedure. To be done in May.

## 2010-09-20 NOTE — Assessment & Plan Note (Signed)
Summary: f/u echo done at 2pm   Visit Type:  f/u ECHO Primary Provider:  Dondra Spry DO  CC:  None.  History of Present Illness: 70 yo AAM with history of morbid obesity, obstructive sleep apnea, HTN and GERD who was  referred in May 2010 for SOB, abdominal swelling and LE edema. He has since been diagnosed with a non-ischemic CM, moderate AI, non-obstructive CAD and a dilated aortic root. He has undergone a right and left heart cath with non-obstructive coronary artery disease. A CT angiogram was performed to look closely at his aortic root and showed moderate dilation of the aortic root. It is felt that his aortic insufficiency is likely related to the aortic root dilation.  He is here today for follow up and to review his  echo that was performed this am.  He has been diuresed effectively and feels great. His lower extremity edema and abdominal bloating are  completely resolved. He has had no chest pain, SOB, DOE, palpitations, near syncope or syncope. He has been very active and walks several miles daily. He has been taking all of his medications. No complaints.   At the last visit, I ordered an MRA of the ascending aorta and heart but he could not fit into the MRI scanner secondary to size. Echo today with normal LV systolic function, moderate AI and enlarged aortic root/asc aorta. The aortic root size has increased since last echo.    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) 10)  Thoracic Aneurysm Without Mention of Rupture  (ICD-441.2) 11)  Cardiomyopathy, Secondary  (ICD-425.9) 12)  Congestive Heart Failure, Unspecified   (ICD-428.0) 13)  Nonspecific Abnormal Electrocardiogram  (ICD-794.31) 14)  Neoplasm, Malignant, Colon, Family Hx, Sibling  (ICD-V16.0) 15)  Hyperbilirubinemia  (ICD-782.4) 16)  Weight Loss  (ICD-783.21) 17)  Congestive Heart Failure, Biventricular  (ICD-428.40) 18)  Anasarca  (ICD-782.3) 19)  Anorexia, Chronic  (ICD-783.0) 20)  Nausea, Chronic  (ICD-787.02)  Current Medications (verified): 1)  Klor-Con M20 20 Meq Cr-Tabs (Potassium Chloride Crys Cr) .... 2 Tabs By Mouth  Two Times A Day 2)  Hydralazine Hcl 50 Mg Tabs (Hydralazine Hcl) .... Take One Tablet By Mouth Three Times A Day 3)  Micardis Hct 80-25 Mg Tabs (Telmisartan-Hctz) .Marland Kitchen.. 1 Tablet By Mouth Once Daily 4)  Furosemide 40 Mg  Tabs (Furosemide) .Marland Kitchen.. 1 By Mouth Q Am 5)  Centrum Ultra Mens  Tabs (Multiple Vitamins-Minerals) .Marland Kitchen.. 1 Tab Once Daily 6)  Aspirin 81 Mg Tbec (Aspirin) .... Take One Tablet By Mouth Daily 7)  Carvedilol 25 Mg Tabs (Carvedilol) .... 1/2 Tab Two Times A Day  Allergies (verified): No Known Drug Allergies  Past History:  Past Medical History: hypokalemia Hypertension  Sleep apnea-occasionally wears CPAP at night gallstones s/p cholecystectomy GERD Morbid obesity H. pylori gastritis (Tx Pylera 4/10) Aortic insufficiency-moderate Enlarged aortic root      a. 02/16/09 Chest CT:                The aortic root has a diameter measuring 4.6 cm, image 64 of the coronal series.  The ascending  thoracic aorta has a diameter of 3.8 cm, image 65 of the coronal series.  At the level of the aortic arch                  the thoracic aorta has a maximum diameter of 3.2 cm.  The descending thoracic aorta has a maximum                   diameter of 3.2 cm.  There is no evidence for aortic dissection. Dilated Cardiomyopathy/NICM      a. 01/2009 - Cath - nonobs. dzs.  EF 35%. Normal on last echo. Chronic Syst. CHF Non-obstructive CAD     Social History: Reviewed history from 11/29/2009 and no  changes required. Patient has never smoked.  Alcohol Use - no Illicit Drug Use - no Occupation: [employer]  Married,   42 years  no children    Review of Systems  The patient denies fatigue, malaise, fever, weight gain/loss, vision loss, decreased hearing, hoarseness, chest pain, palpitations, shortness of breath, prolonged cough, wheezing, sleep apnea, coughing up blood, abdominal pain, blood in stool, nausea, vomiting, diarrhea, heartburn, incontinence, blood in urine, muscle weakness, joint pain, leg swelling, rash, skin lesions, headache, fainting, dizziness, depression, anxiety, enlarged lymph nodes, easy bruising or bleeding, and environmental allergies.    Vital Signs:  Patient profile:   70 year old male Height:      74 inches Weight:      343 pounds BMI:     44.20 Pulse rate:   73 / minute BP sitting:   145 / 72 Cuff size:   large  Vitals Entered By: Oswald Hillock (Dec 21, 2009 10:52 AM)  Physical Exam  General:  General: Well developed, obese.  NAD HEENT: OP clear, mucus membranes moist SKIN: warm, dry Neuro: No focal deficits Musculoskeletal: Muscle strength 5/5 all ext Psychiatric: Mood and affect normal Neck: No JVD, no carotid bruits, no thyromegaly, no lymphadenopathy. Lungs:Clear bilaterally, no wheezes, rhonci, crackles CV: RRR with diastolic  murmur. No gallops rubs Abdomen: soft, NT, ND, BS present Extremities: No edema, pulses 2+.    Echocardiogram  Procedure date:  12/21/2009  Findings:      Left ventricle: The cavity size was mildly dilated. Wall thickness       was normal. Systolic function was normal. The estimated ejection       fraction was 55%. Although no diagnostic regional wall motion       abnormality was identified, this possibility cannot be completely       excluded on the basis of this study. Doppler parameters are       consistent with abnormal left ventricular relaxation (grade 1       diastolic  dysfunction).     - Aortic valve: Not well visualized. Probably trileaflet. Moderate       regurgitation.     - Aorta: The aortic root and ascending aorta were severely dilated.       Aortic root dimension:77mm (ED). Ascending aortic diameter: 59mm       (S).     - Mitral valve: No regurgitation.     - Left atrium: The atrium was mildly dilated.     - Pulmonary arteries: No complete TR doppler jet so unable to       estimate PA systolic pressure.     - Inferior vena cava: The vessel was normal in size; the  respirophasic diameter changes were in the normal range (= 50%);       findings are consistent with normal central venous pressure.     Impressions:            - Technically difficult study with poor acoustic windows. Mildly       dilated LV with normal systolic function, EF 55%. The aortic root       is dilated to 5.5 cm and the ascending aorta is dilated to 5.9 cm.       The aortic valve appears trileaflet with no stenosis but up to       moderate aortic insufficiency. The aortic valve and AI are not       well-visualized. Would confirm aortic dilatation with CTA chest       and would consider TEE to get a closer look at the aortic valve       prior to any possible aortic surgery.  Impression & Recommendations:  Problem # 1:  AORTIC INSUFFICIENCY, MODERATE (ICD-424.1) Stable with normal LV systolic function. His aortic root is enlarging. I have discussed possibility of surgery being needed in the near future. Will get CTA to size aorta. Will see him back and arrange TEE to visualize aortic valve.   His updated medication list for this problem includes:    Micardis Hct 80-25 Mg Tabs (Telmisartan-hctz) .Marland Kitchen... 1 tablet by mouth once daily    Furosemide 40 Mg Tabs (Furosemide) .Marland Kitchen... 1 by mouth q am    Carvedilol 25 Mg Tabs (Carvedilol) .Marland Kitchen... 1/2 tab two times a day  Orders: TLB-BMP (Basic Metabolic Panel-BMET) (80048-METABOL)  Problem # 2:  THORACIC ANEURYSM WITHOUT MENTION  OF RUPTURE (ICD-441.2)  See above.  Problem # 3:  HYPERTENSION (ICD-401.9) His BP has been well controlled at home. It is slightly elevated today but he did not take his meds today . No change.   His updated medication list for this problem includes:    Hydralazine Hcl 50 Mg Tabs (Hydralazine hcl) .Marland Kitchen... Take one tablet by mouth three times a day    Micardis Hct 80-25 Mg Tabs (Telmisartan-hctz) .Marland Kitchen... 1 tablet by mouth once daily    Furosemide 40 Mg Tabs (Furosemide) .Marland Kitchen... 1 by mouth q am    Aspirin 81 Mg Tbec (Aspirin) .Marland Kitchen... Take one tablet by mouth daily    Carvedilol 25 Mg Tabs (Carvedilol) .Marland Kitchen... 1/2 tab two times a day  Other Orders: CT Scan  (CT Scan)  Patient Instructions: 1)  Your physician recommends that you schedule a follow-up appointment in: 3-4 weeks 2)  Your physician recommends that you continue on your current medications as directed. Please refer to the Current Medication list given to you today. 3)  Non-Cardiac CT Angiography (CTA), is a special type of CT scan that uses a computer to produce multi-dimensional views of major blood vessels throughout the body. In CT angiography, a contrast material is injected through an IV to help visualize the blood vessels. Do not eat for 2 hours prior to this test

## 2010-09-20 NOTE — Assessment & Plan Note (Signed)
Summary: 2 MONTH FOLLOW UP/MHF   Vital Signs:  Patient profile:   70 year old male Weight:      341 pounds BMI:     43.94 O2 Sat:      98 % on Room air Temp:     97.1 degrees F oral Pulse rate:   72 / minute Pulse rhythm:   regular Resp:     18 per minute BP sitting:   122 / 60  (left arm) Cuff size:   Thigh  Vitals Entered By: Glendell Docker CMA (May 11, 2010 8:39 AM)  O2 Flow:  Room air CC: 2 month followup up Is Patient Diabetic? No Pain Assessment Patient in pain? no        Primary Care Provider:  Dondra Spry DO  CC:  2 month followup up.  History of Present Illness: 70 y/o AA male for f/u Htn - stable obesity and abnormal blood sugar good wt loss with dietary change   Preventive Screening-Counseling & Management  Alcohol-Tobacco     Smoking Status: never  Allergies (verified): No Known Drug Allergies  Past History:  Risk Factors: Alcohol Use: 0 (04/05/2009) Caffeine Use: 3 beverages per day (04/05/2009) Exercise: no (04/05/2009)  Past Medical History: hypokalemia  Hypertension   Sleep apnea-occasionally wears CPAP at night gallstones s/p cholecystectomy GERD  Morbid obesity  H. pylori gastritis (Tx Pylera 4/10) Aortic insufficiency-moderate Enlarged aortic root      a. 02/16/09 Chest CT:                The aortic root has a diameter measuring 4.6 cm, image 64 of the coronal series.  The ascending                            thoracic aorta has a diameter of 3.8 cm, image 65 of the coronal series.  At the level of the aortic arch                  the thoracic aorta has a maximum diameter of 3.2 cm.  The descending thoracic aorta has a maximum                   diameter of 3.2 cm.  There is no evidence for aortic dissection.     b. Repeat CT chest 12/23/09: Aortic root 5.5 cm, ascending aorta 4.0cm.  Dilated Cardiomyopathy/NICM      a. 01/2009 - Cath - nonobs. dzs.  EF 35%. Normal on last echo. Chronic Syst. 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  Alcohol Use - no  Illicit Drug Use - no  Occupation: [employer] and [employer]  Married,   42 years  no children    Physical Exam  General:  alert and overweight-appearing.   Lungs:  normal respiratory effort, normal breath sounds, and no wheezes.   Heart:  normal rate and regular rhythm.  SEM 2/6   Impression & Recommendations:  Problem #  1:  OBESITY, MORBID (ICD-278.01) Assessment Improved pt reports feeling much better after changing diet.  dietician was very helpful he lost 6 lbs encouraged pt to continue - low carb, high vegetable, low meat diet  Problem # 2:  HYPERTENSION (ICD-401.9) pt to monitor BP at home.   we discussed the possibility of tapering BP meds if significant wt loss results in lower BP His updated medication list for this problem includes:    Hydralazine Hcl 50 Mg Tabs (Hydralazine hcl) .Marland Kitchen... Take 2 tabs every morning and 1 tab at bedtime    Micardis Hct 80-25 Mg Tabs (Telmisartan-hctz) .Marland Kitchen... 1 tablet by mouth once daily    Furosemide 40 Mg Tabs (Furosemide) .Marland Kitchen... 1 by mouth q am    Carvedilol 25 Mg Tabs (Carvedilol) .Marland Kitchen... 1  tab two times a day  BP today: 122/60 Prior BP: 148/68 (04/20/2010)  Labs Reviewed: K+: 4.1 (04/20/2010) Creat: : 0.99 (04/20/2010)   Chol: 149 (05/31/2009)   HDL: 51 (05/31/2009)   LDL: 88 (05/31/2009)   TG: 50 (05/31/2009)  Complete Medication List: 1)  Klor-con M20 20 Meq Cr-tabs (Potassium chloride crys cr) .... 3  tabs by mouth  two times a  day 2)  Hydralazine Hcl 50 Mg Tabs (Hydralazine hcl) .... Take 2 tabs every morning and 1 tab at bedtime 3)  Micardis Hct 80-25 Mg Tabs (Telmisartan-hctz) .Marland Kitchen.. 1 tablet by mouth once daily 4)  Furosemide 40 Mg Tabs (Furosemide) .Marland Kitchen.. 1 by mouth q am 5)  Centrum Ultra Mens Tabs (Multiple vitamins-minerals) .Marland Kitchen.. 1 tab once daily 6)  Aspirin 81 Mg Tbec (Aspirin) .... Take one tablet by mouth daily 7)  Carvedilol 25 Mg Tabs (Carvedilol) .Marland Kitchen.. 1  tab two times a day  Other Orders: Influenza Vaccine MCR (96045) Flu Vaccine 2yrs + MEDICARE PATIENTS ([id number])  Patient Instructions: 1)  Please schedule a follow-up appointment in 4 months. 2)  BMP prior to visit, ICD-9:  401.9 3)  Please return for lab work one (1) week before your next appointment.   Current Allergies (reviewed today): No known allergies    Immunizations Administered:  Influenza Vaccine # 1:    Vaccine Type: Fluvax MCR    Site: left deltoid    Mfr: Gl[employer]oSmith[employer]    Dose: 0.5 ml    Route: IM    Given by: Glendell Docker CMA    Exp. Date: 02/18/2011    Lot #: [id number]    VIS given: 03/15/10 version given May 11, 2010.  Flu Vaccine Consent Questions:    Do you have a history of severe allergic reactions to this vaccine? no    Any prior history of allergic reactions to egg and/or gelatin? no    Do you have a sensitivity to the preservative Thimersol? no    Do you have a past history of Guillan-Barre Syndrome? no    Do you currently have an acute febrile illness? no    Have you ever had a severe reaction to latex? no    Vaccine information given and explained to patient? yes

## 2010-09-20 NOTE — Progress Notes (Signed)
  Phone Note From Pharmacy   Request: Speak with Provider Action Taken: Phone call completed Details for Reason: Abnormal Labs Summary of Call: Called by Darl Pikes with [facility] at [place] office concerning hypokalemia. [name] potassium was 2.8.  Called the patient to request he take an extra dose of potassium.  He is on 40 mEq two times a day.  He stated that he had not taken his potassium this morning prior to labs being drawn.  I advised patient to take his usual dose tonignt with one extra dose of 40 mEq in one hour.  He will follow-up in our office in am for labs to be drawn.  Office notified by phone call message. Initial call taken by: Joni Reining NP

## 2010-09-20 NOTE — Consult Note (Signed)
Summary: [facility]  [facility]   Imported By: Lester Ferdinand 02/08/2010 09:14:12  _____________________________________________________________________  External Attachment:    Type:   Image     Comment:   External Document

## 2010-09-20 NOTE — Progress Notes (Signed)
Summary: Furosemide Refill  Phone Note Refill Request Message from:  Fax from Pharmacy on March 21, 2010 11:48 AM  Refills Requested: Medication #1:  FUROSEMIDE 40 MG  TABS 1 by mouth q AM   Dosage confirmed as above?Dosage Confirmed   Brand Name Necessary? No   Supply Requested: 1 month   Last Refilled: 02/23/2010  Method Requested: Electronic Next Appointment Scheduled: 04/20/2010 @ 8 am Dr Artist Pais Initial call taken by: Glendell Docker CMA,  March 21, 2010 11:48 AM  Follow-up for Phone Call        ok to refill x3 Follow-up by: D. Thomos Lemons DO,  March 21, 2010 5:51 PM    Prescriptions: FUROSEMIDE 40 MG  TABS (FUROSEMIDE) 1 by mouth q AM  #30 x 3   Entered by:   Glendell Docker CMA   Authorized by:   D. Thomos Lemons DO   Signed by:   Glendell Docker CMA on 03/22/2010   Method used:   Electronically to        [facility]  [facility] [phone]* (retail)       [address] [place], [place]  [address]       Ph: [phone]       Fax: [phone]   RxID:   [id number]

## 2010-09-20 NOTE — Progress Notes (Signed)
Summary: Micardis Refill  Phone Note Refill Request Message from:  Fax from Pharmacy on Jan 13, 2010 11:44 AM  Refills Requested: Medication #1:  MICARDIS HCT 80-25 MG TABS 1 tablet by mouth once daily   Dosage confirmed as above?Dosage Confirmed   Brand Name Necessary? No   Supply Requested: 1 month  Method Requested: Electronic Next Appointment Scheduled: 02/07/2010 @ 8:15a Initial call taken by: Glendell Docker CMA,  Jan 13, 2010 11:48 AM    Prescriptions: MICARDIS HCT 80-25 MG TABS (TELMISARTAN-HCTZ) 1 tablet by mouth once daily  #30 x 2   Entered by:   Glendell Docker CMA   Authorized by:   D. Thomos Lemons DO   Signed by:   Glendell Docker CMA on 01/13/2010   Method used:   Electronically to        [facility]  [facility] [phone]* (retail)       [address] [place], [place]  [address]       Ph: [phone]       Fax: [phone]   RxID:   [id number]

## 2010-09-20 NOTE — Progress Notes (Signed)
Summary: BMP prior to cardiac MRI  Phone Note Outgoing Call   Call placed to: Patient Summary of Call: Pt will need BMP in March prior to cardiac MRI. Left message for him to call to set up time for this Initial call taken by: Dossie Arbour, RN, BSN,  October 01, 2009 1:38 PM  Follow-up for Phone Call        returning call, [name]  October 01, 2009 1:44 PM  Spoke with pt. He will come to office for lab work the week of March 14th. BMP ordered Follow-up by: Dossie Arbour, RN, BSN,  October 01, 2009 1:55 PM

## 2010-09-20 NOTE — Letter (Signed)
   [place] [facility] [address] [place], [place]  [address]  [place] Phone: [phone]      February 28, 2010   [facility] [address] [place], [place] [address]  RE:  LAB RESULTS  Dear  Mr. Calaway,  The following is an interpretation of your most recent lab tests.  Please take note of any instructions provided or changes to medications that have resulted from your lab work.  ELECTROLYTES:  Good - no changes needed  KIDNEY FUNCTION TESTS:  Good - no changes needed    DIABETIC STUDIES:  Good - no changes needed Blood Glucose: 112   HgbA1C: 5.3          Sincerely Yours,    Dr. Thomos Lemons

## 2010-09-20 NOTE — Assessment & Plan Note (Signed)
Summary: per check out/sf   Visit Type:  rov Primary Provider:  Dondra Spry DO  CC:  no cardiac complaints today.  History of Present Illness: 70 yo AAM with history of morbid obesity, obstructive sleep apnea, HTN and GERD who was  referred in May 2010 for SOB, abdominal swelling and LE edema. He has since been diagnosed with a non-ischemic CM, moderate AI, non-obstructive CAD and a dilated aortic root. He has undergone a right and left heart cath with non-obstructive coronary artery disease. A CT angiogram was performed in June 2010 to look closely at his aortic root and showed moderate dilation of the aortic root  (root 4.6 cm, ascending aorta 3.8cm). It is felt that his aortic insufficiency is likely related to the aortic root dilation. His LV is mildly dilated but LV function is now normal. Echo this spring showed enlarging aortic root. I planned on performing an MRI to look at his valve, degree of AI and size of root but he was too large for the scanner. Repeat CT angiogram chest 5/05/11showed enlarged aortic root  at 5.5cm with ascending aorta 4.0cm.    He is here today for follow up.  He has been diuresed effectively and feels great. His lower extremity edema and abdominal bloating are  completely resolved. He has had no chest pain, SOB, DOE, palpitations, near syncope or syncope. He has been very active and walks several miles daily. He has been taking all of his medications. No complaints.     Current Medications (verified): 1)  Klor-Con M20 20 Meq Cr-Tabs (Potassium Chloride Crys Cr) .... 3  Tabs By Mouth  Two Times A Day 2)  Hydralazine Hcl 50 Mg Tabs (Hydralazine Hcl) .... Take 2 Tabs Every Morning and 1 Tab At Bedtime 3)  Micardis Hct 80-25 Mg Tabs (Telmisartan-Hctz) .Marland Kitchen.. 1 Tablet By Mouth Once Daily 4)  Furosemide 40 Mg  Tabs (Furosemide) .Marland Kitchen.. 1 By Mouth Q Am 5)  Centrum Ultra Mens  Tabs (Multiple Vitamins-Minerals) .Marland Kitchen.. 1 Tab Once Daily 6)  Aspirin 81 Mg Tbec (Aspirin) .... Take  One Tablet By Mouth Daily 7)  Carvedilol 25 Mg Tabs (Carvedilol) .... 1/2 Tab Two Times A Day  Allergies (verified): No Known Drug Allergies  Past History:  Past Medical History: hypokalemia Hypertension  Sleep apnea-occasionally wears CPAP at night gallstones s/p cholecystectomy GERD Morbid obesity H. pylori gastritis (Tx Pylera 4/10) Aortic insufficiency-moderate Enlarged aortic root      a. 02/16/09 Chest CT:                The aortic root has a diameter measuring 4.6 cm, image 64 of the coronal series.  The ascending                            thoracic aorta has a diameter of 3.8 cm, image 65 of the coronal series.  At the level of the aortic arch                  the thoracic aorta has a maximum diameter of 3.2 cm.  The descending thoracic aorta has a maximum                   diameter of 3.2 cm.  There is no evidence for aortic dissection.     b. Repeat CT chest 12/23/09: Aortic root 5.5 cm, ascending aorta 4.0cm.  Dilated Cardiomyopathy/NICM      a. 01/2009 -  Cath - nonobs. dzs.  EF 35%. Normal on last echo. Chronic Syst. CHF Non-obstructive CAD     Past Surgical History: Reviewed history from 11/29/2009 and no changes required. 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 Family History of Breast Cancer:sisters Family History of Colon Cancer:brother   Prostate Cancer-brother Family History of Diabetes: 5 siblings Family History of Stomach Cancer:sister Mother-died at 96 from low potassium, HTN Father-died at 41 from CVA Sister died at age 11 from kidney failure      Social History: Reviewed history from 11/29/2009 and no changes required. Patient has never smoked.  Alcohol Use - no Illicit Drug Use - no Occupation: [employer]  Married,   42 years  no children    Review of Systems  The patient denies fatigue, malaise, fever,  weight gain/loss, vision loss, decreased hearing, hoarseness, chest pain, palpitations, shortness of breath, prolonged cough, wheezing, sleep apnea, coughing up blood, abdominal pain, blood in stool, nausea, vomiting, diarrhea, heartburn, incontinence, blood in urine, muscle weakness, joint pain, leg swelling, rash, skin lesions, headache, fainting, dizziness, depression, anxiety, enlarged lymph nodes, easy bruising or bleeding, and environmental allergies.    Vital Signs:  Patient profile:   70 year old male Height:      74 inches Weight:      346 pounds BMI:     44.58 Pulse rate:   85 / minute Pulse rhythm:   irregular BP sitting:   136 / 60  (left arm) Cuff size:   large  Vitals Entered By: Danielle Rankin, CMA (Jan 18, 2010 12:17 PM)  Physical Exam  General:  General: Pleasant, Well developed obese AAM,  NAD HEENT: OP clear, mucus membranes moist SKIN: warm, dry Neuro: No focal deficits Musculoskeletal: Muscle strength 5/5 all ext Psychiatric: Mood and affect normal Neck: No JVD, no carotid bruits, no thyromegaly, no lymphadenopathy. Lungs:Clear bilaterally, no wheezes, rhonci, crackles CV: RRR with slight diastolic  murmur, No gallops rubs Abdomen: soft, NT, ND, BS present Extremities: No edema, pulses 2+.    EKG  Procedure date:  01/18/2010  Findings:      Sinus rhythm, rate 85 bpm. LAD. QTc 478 msec.   CT Scan  Procedure date:  12/23/2009  Findings:      The aortic root measures 5.5 cm in diameter, unchanged,   image 51.  The ascending thoracic aorta measures 4.0 cm on image   47, unchanged.  Descending thoracic aorta is unremarkable.  There   is some mild, scattered atherosclerotic vascular disease.  No   aortic dissection.  No pulmonary embolus is identified.  No   axillary, hilar mediastinal lymphadenopathy.  Heart size is mildly   enlarged.  No pleural or pericardial effusion.  The lungs are   clear.    Incidentally imaged upper abdomen again seen is  surgical clips in   the right adrenal bed.  Visualized abdominal contents otherwise   unremarkable.  There is no focal bony abnormality.  Echocardiogram  Procedure date:  12/21/2009  Findings:      Left ventricle: The cavity size was mildly dilated. Wall thickness       was normal. Systolic function was normal. The estimated ejection       fraction was 55%. Although no diagnostic regional wall motion       abnormality was identified, this possibility cannot be completely  excluded on the basis of this study. Doppler parameters are       consistent with abnormal left ventricular relaxation (grade 1       diastolic dysfunction).     - Aortic valve: Not well visualized. Probably trileaflet. Moderate       regurgitation.     - Aorta: The aortic root and ascending aorta were severely dilated.       Aortic root dimension:103mm (ED). Ascending aortic diameter: 59mm       (S).     - Mitral valve: No regurgitation.     - Left atrium: The atrium was mildly dilated.     - Pulmonary arteries: No complete TR doppler jet so unable to       estimate PA systolic pressure.     - Inferior vena cava: The vessel was normal in size; the       respirophasic diameter changes were in the normal range (= 50%);       findings are consistent with normal central venous pressure.     Impressions:            - Technically difficult study with poor acoustic windows. Mildly       dilated LV with normal systolic function, EF 55%. The aortic root       is dilated to 5.5 cm and the ascending aorta is dilated to 5.9 cm.       The aortic valve appears trileaflet with no stenosis but up to       moderate aortic insufficiency. The aortic valve and AI are not       well-visualized. Would confirm aortic dilatation with CTA chest       and would consider TEE to get a closer look at the aortic valve       prior to any possible aortic surgery.  Impression & Recommendations:  Problem # 1:  AORTIC INSUFFICIENCY,  MODERATE (ICD-424.1) Moderate aortic valve insufficiency in setting of mildly dilated LV and known aortic root dilatation with recent enlargement. Will refer to Dr. Cornelius Moras  CT surgery for consideration for aortic root replacement with AVR. Mr. Cordial is in agreement and willing to pursue a surgical opinion.  He is overall a good surgical candidate with the exception of his obesity.   His updated medication list for this problem includes:    Micardis Hct 80-25 Mg Tabs (Telmisartan-hctz) .Marland Kitchen... 1 tablet by mouth once daily    Furosemide 40 Mg Tabs (Furosemide) .Marland Kitchen... 1 by mouth q am    Carvedilol 25 Mg Tabs (Carvedilol) .Marland Kitchen... 1/2 tab two times a day  Orders: TCTS Referral (TCTS Ref)  Problem # 2:  CARDIOMYOPATHY, DILATED (ICD-425.4) LV function now normal.  No changes.   His updated medication list for this problem includes:    Micardis Hct 80-25 Mg Tabs (Telmisartan-hctz) .Marland Kitchen... 1 tablet by mouth once daily    Furosemide 40 Mg Tabs (Furosemide) .Marland Kitchen... 1 by mouth q am    Aspirin 81 Mg Tbec (Aspirin) .Marland Kitchen... Take one tablet by mouth daily    Carvedilol 25 Mg Tabs (Carvedilol) .Marland Kitchen... 1/2 tab two times a day  His updated medication list for this problem includes:    Micardis Hct 80-25 Mg Tabs (Telmisartan-hctz) .Marland Kitchen... 1 tablet by mouth once daily    Furosemide 40 Mg Tabs (Furosemide) .Marland Kitchen... 1 by mouth q am    Aspirin 81 Mg Tbec (Aspirin) .Marland Kitchen... Take one tablet by mouth daily    Carvedilol 25 Mg Tabs (  Carvedilol) .Marland Kitchen... 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   Problem # 4:  HYPERTENSION (ICD-401.9) Well controlled on current therapy.   His updated medication list for this problem includes:    Hydralazine Hcl 50 Mg Tabs (Hydralazine hcl) .Marland Kitchen... Take 2 tabs every morning and 1 tab at bedtime    Micardis Hct 80-25 Mg Tabs (Telmisartan-hctz) .Marland Kitchen... 1 tablet by mouth once daily    Furosemide 40 Mg Tabs (Furosemide) .Marland Kitchen... 1 by mouth q  am    Aspirin 81 Mg Tbec (Aspirin) .Marland Kitchen... Take one tablet by mouth daily    Carvedilol 25 Mg Tabs (Carvedilol) .Marland Kitchen... 1/2 tab two times a day  His updated medication list for this problem includes:    Hydralazine Hcl 50 Mg Tabs (Hydralazine hcl) .Marland Kitchen... Take 2 tabs every morning and 1 tab at bedtime    Micardis Hct 80-25 Mg Tabs (Telmisartan-hctz) .Marland Kitchen... 1 tablet by mouth once daily    Furosemide 40 Mg Tabs (Furosemide) .Marland Kitchen... 1 by mouth q am    Aspirin 81 Mg Tbec (Aspirin) .Marland Kitchen... Take one tablet by mouth daily    Carvedilol 25 Mg Tabs (Carvedilol) .Marland Kitchen... 1/2 tab two times a day  Problem # 5:  CAD, NATIVE VESSEL (ICD-414.01) Stable. Mild disease by cath June 2010.   His updated medication list for this problem includes:    Aspirin 81 Mg Tbec (Aspirin) .Marland Kitchen... Take one tablet by mouth daily    Carvedilol 25 Mg Tabs (Carvedilol) .Marland Kitchen... 1/2 tab two times a day  His updated medication list for this problem includes:    Aspirin 81 Mg Tbec (Aspirin) .Marland Kitchen... Take one tablet by mouth daily    Carvedilol 25 Mg Tabs (Carvedilol) .Marland Kitchen... 1/2 tab two times a day  Patient Instructions: 1)  Your physician recommends that you schedule a follow-up appointment in: 4 months 2)  Your physician recommends that you continue on your current medications as directed. Please refer to the Current Medication list given to you today. 3)  You have been referred to Dr[facility] Prescriptions: HYDRALAZINE HCL 50 MG TABS (HYDRALAZINE HCL) take 2 tabs every morning and 1 tab at bedtime  #90 x 11   Entered by:   Danielle Rankin, CMA   Authorized by:   Verne Carrow, MD   Signed by:   Danielle Rankin, CMA on 01/18/2010   Method used:   Electronically to        [facility] [phone]* (retail)       [address] [place], [place]  [address]       Ph: [phone]       Fax: [phone]   RxID:   [id number]

## 2010-09-20 NOTE — Assessment & Plan Note (Signed)
Summary: 1 MONTH FOLLOW UP/MHF   Vital Signs:  Patient profile:   70 year old male Weight:      348.50 pounds BMI:     44.91 Temp:     97.7 degrees F oral Pulse rate:   80 / minute Pulse rhythm:   regular Resp:     20 per minute BP sitting:   142 / 60  (right arm) Cuff size:   large  Vitals Entered By: Glendell Docker CMA (February 16, 2010 10:50 AM) CC: Rm 2- 1 Month follow up Is Patient Diabetic? No Comments no concerns, medications reviewed no changes, follow with nutrition coordinator for wt management   Primary Care Provider:  D. Thomos Lemons DO  CC:  Rm 2- 1 Month follow up.  History of Present Illness:  Hypertension Follow-Up      This is a 70 year old man who presents for Hypertension follow-up.  The patient denies lightheadedness.  The patient denies the following associated symptoms: chest pain.  Compliance with medications (by patient report) has been near 100%.    vascular surgeon consult reviewed  Allergies (verified): No Known Drug Allergies  Past History:  Past Medical History: hypokalemia Hypertension   Sleep apnea-occasionally wears CPAP at night gallstones s/p cholecystectomy GERD Morbid obesity H. pylori gastritis (Tx Pylera 4/10) Aortic insufficiency-moderate Enlarged aortic root      a. 02/16/09 Chest CT:                The aortic root has a diameter measuring 4.6 cm, image 64 of the coronal series.  The ascending                            thoracic aorta has a diameter of 3.8 cm, image 65 of the coronal series.  At the level of the aortic arch                  the thoracic aorta has a maximum diameter of 3.2 cm.  The descending thoracic aorta has a maximum                   diameter of 3.2 cm.  There is no evidence for aortic dissection.     b. Repeat CT chest 12/23/09: Aortic root 5.5 cm, ascending aorta 4.0cm.  Dilated Cardiomyopathy/NICM      a. 01/2009 - Cath - nonobs. dzs.  EF 35%. Normal on last echo. Chronic Syst. 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  Alcohol Use - no Illicit Drug Use - no  Occupation: [employer] and [employer]  Married,   42 years  no children    Physical Exam  General:  alert and overweight-appearing.   Head:  normocephalic and atraumatic.   Neck:  No deformities, masses, or tenderness noted. Lungs:  normal respiratory effort and normal breath sounds.   Heart:  holosystolic murmur, normal rate, regular rhythm, and no gallop.   Extremities:  trace left pedal edema and trace right  pedal edema.     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 Cornelius Moras recommends close f/u q 6 months (2 D Echo) and continued medical therapy  Orders: T-Syphilis Test ([facility]PR) [phone]) T- * Misc. Laboratory test [phone])  Problem # 2:  HYPERTENSION (ICD-401.9) stable.  Maintain current medication regimen.  His updated medication list for this problem includes:    Hydralazine Hcl 50 Mg Tabs (Hydralazine hcl) .Marland Kitchen... Take 2 tabs every morning and 1 tab at bedtime    Micardis Hct 80-25 Mg Tabs (Telmisartan-hctz) .Marland Kitchen... 1 tablet by mouth once daily    Furosemide 40 Mg Tabs (Furosemide) .Marland Kitchen... 1 by mouth q am    Carvedilol 25 Mg Tabs (Carvedilol) .Marland Kitchen... 1  tab two times a day  Orders: T-Basic Metabolic Panel [phone])  BP today: 142/60 Prior BP: 136/60 (01/18/2010)  Labs Reviewed: K+: 4.3 (12/24/2009) Creat: : 0.9 (12/24/2009)   Chol: 149 (05/31/2009)   HDL: 51 (05/31/2009)   LDL: 88  (05/31/2009)   TG: 50 (05/31/2009)  Complete Medication List: 1)  Klor-con M20 20 Meq Cr-tabs (Potassium chloride crys cr) .... 3  tabs by mouth  two times a day 2)  Hydralazine Hcl 50 Mg Tabs (Hydralazine hcl) .... Take 2 tabs every morning and 1 tab at bedtime 3)  Micardis Hct 80-25 Mg Tabs (Telmisartan-hctz) .Marland Kitchen.. 1 tablet by mouth once daily 4)  Furosemide 40 Mg Tabs (Furosemide) .Marland Kitchen.. 1 by mouth q am 5)  Centrum Ultra Mens Tabs (Multiple vitamins-minerals) .Marland Kitchen.. 1 tab once daily 6)  Aspirin 81 Mg Tbec (Aspirin) .... Take one tablet by mouth daily 7)  Carvedilol 25 Mg Tabs (Carvedilol) .Marland Kitchen.. 1  tab two times a day  Other Orders: T- Hemoglobin A1C ([id number])  Patient Instructions: 1)  Please schedule a follow-up appointment in 2 months. 2)  The highlighted prescriptions were electronically sent to your pharmacy Prescriptions: CARVEDILOL 25 MG TABS (CARVEDILOL) 1  tab two times a day  #60 x 3   Entered and Authorized by:   D. Thomos Lemons DO   Signed by:   D. Thomos Lemons DO on 02/16/2010   Method used:   Electronically to        [facility]  [facility] [phone]* (retail)       [address] [place], [place]  [address]       Ph: [phone]       Fax: [phone]   RxID:   [id number]   Current Allergies (reviewed today): No known allergies

## 2010-09-22 NOTE — Progress Notes (Signed)
Summary: Lab Results  Phone Note Outgoing Call   Summary of Call: call pt - electrolytes and kidney function - normal.   cholesterol levels normal.  Thyroid blood test normal  PSA is elevated.  I suggest OV to discuss.  It would be helpful if he has any prev records re:  prior PSA values Initial call taken by: D. Thomos Lemons DO,  September 08, 2010 6:31 PM  Follow-up for Phone Call        call placed to patient at [phone], no answer. A voice  message was left for patient to return phone call Follow-up by: Glendell Docker CMA,  September 09, 2010 8:13 AM  Additional Follow-up for Phone Call Additional follow up Details #1::        patient returned phone call, he has been advised per Dr Artist Pais instruction. He states that he was previously seen by [facility] physician Dr Milderd Meager prior to seeing Dr Artist Pais. He states he was informed by Dr Della Goo that his PSA was elevated and was referred to Urology, however he does not rememeber the name of the Urologist that he has seen.  Call was placed to Dr Joelyn Oms office [facility]) at [phone], Patient has had only one PSA drawn at this office in July of 2009  resulted out at 3.37.  Patient is scheduled to follow up on Monday 09/12/2010 @ 3:15 pm Additional Follow-up by: Glendell Docker CMA,  September 09, 2010 3:55 PM

## 2010-09-28 NOTE — Assessment & Plan Note (Signed)
Summary: 4 MONTH FOLLOW UP/MHF   Vital Signs:  Patient profile:   70 year old [place] Height:      74 inches Weight:      337.50 pounds BMI:     43.49 O2 Sat:      99 % on Room air Temp:     97.4 degrees F oral Pulse rate:   77 / minute Resp:     18 per minute BP sitting:   142 / 70  (right arm) Cuff size:   large  Vitals Entered By: Glendell Docker CMA (September 07, 2010 7:58 AM)  O2 Flow:  Room air CC: 4 Month Follow up Is Patient Diabetic? No Pain Assessment Patient in pain? no      Comments no concerns, fasting for blood work   Primary Care Provider:  D. Thomos Lemons DO  CC:  4 Month Follow up.  History of Present Illness:  Hypertension Follow-Up      This is a 70 year old man who presents for Hypertension follow-up.  The patient reports edema, but denies lightheadedness.  The patient denies the following associated symptoms: chest pain.  Compliance with medications (by patient report) has been near 100%.  The patient reports that dietary compliance has been fair.    DM II borderline - fair dietary compliance.  some wt loss  Preventive Screening-Counseling & Management  Alcohol-Tobacco     Smoking Status: never  Allergies (verified): No Known Drug Allergies  Past History:  Past Medical History: hypokalemia Hypertension   Sleep apnea-occasionally wears CPAP at night gallstones s/p cholecystectomy  GERD Morbid obesity  H. pylori gastritis (Tx Pylera 4/10) Aortic insufficiency-moderate Enlarged aortic root      a. 02/16/09 Chest CT:                The aortic root has a diameter measuring 4.6 cm, image 64 of the coronal series.  The ascending                            thoracic aorta has a diameter of 3.8 cm, image 65 of the coronal series.  At the level of the aortic arch                  the thoracic aorta has a maximum diameter of 3.2 cm.  The descending thoracic aorta has a maximum                   diameter of 3.2 cm.  There is no evidence for aortic  dissection.     b. Repeat CT chest 12/23/09: Aortic root 5.5 cm, ascending aorta 4.0cm.  Dilated Cardiomyopathy/NICM      a. 01/2009 - Cath - nonobs. dzs.  EF 35%. Normal on last echo. Chronic Syst. CHF Non-obstructive CAD       Physical Exam  General:  alert and overweight-appearing.   Lungs:  normal respiratory effort and normal breath sounds.   Heart:  normal rate, regular rhythm, and no gallop.   Extremities:  1+ left pedal edema and 1+ right pedal edema.     Impression & Recommendations:  Problem # 1:  HYPERTENSION (ICD-401.9) Assessment Deteriorated increase lasix dose  His updated medication list for this problem includes:    Hydralazine Hcl 50 Mg Tabs (Hydralazine hcl) .Marland Kitchen... Take 2 tabs every morning and 1 tab at bedtime    Micardis Hct 80-25 Mg Tabs (Telmisartan-hctz) .Marland Kitchen... 1 tablet by mouth  once daily    Furosemide 40 Mg Tabs (Furosemide) .Marland Kitchen... 1 and 1/2 tab by mouth q am    Carvedilol 25 Mg Tabs (Carvedilol) .Marland Kitchen... 1  tab two times a day  BP today: 142/70 Prior BP: 132/60 (05/20/2010)  Labs Reviewed: K+: 4.1 (04/20/2010) Creat: : 0.99 (04/20/2010)   Chol: 149 (05/31/2009)   HDL: 51 (05/31/2009)   LDL: 88 (05/31/2009)   TG: 50 (05/31/2009)  Orders: T-Basic Metabolic Panel [id number]) T-Lipid Profile [phone]) T-TSH [phone] Orders: T-Basic Metabolic Panel [id number]) ... 10/10/2010  Problem # 2:  OTHER ABNORMAL GLUCOSE (ICD-790.29) Assessment: Improved pt with fair dietary compliance some wt loss  Labs Reviewed: Creat: 0.99 (04/20/2010)     Complete Medication List: 1)  Klor-con M20 20 Meq Cr-tabs (Potassium chloride crys cr) .... 3  tabs by mouth  two times a day 2)  Hydralazine Hcl 50 Mg Tabs (Hydralazine hcl) .... Take 2 tabs every morning and 1 tab at bedtime 3)  Micardis Hct 80-25 Mg Tabs (Telmisartan-hctz) .Marland Kitchen.. 1 tablet by mouth once daily 4)  Furosemide 40 Mg Tabs (Furosemide) .Marland Kitchen.. 1 and 1/2 tab by mouth q am 5)  Centrum Ultra  Mens Tabs (Multiple vitamins-minerals) .Marland Kitchen.. 1 tab once daily 6)  Aspirin 81 Mg Tbec (Aspirin) .... Take one tablet by mouth daily 7)  Carvedilol 25 Mg Tabs (Carvedilol) .Marland Kitchen.. 1  tab two times a day  Other Orders: T-BNP  (B Natriuretic Peptide) ([id number]) T-PSA ([id number])   Patient Instructions: 1)  Please limit your salt intake to 2 grams per day 2)  Please schedule a follow-up appointment in 2 months. Prescriptions: MICARDIS HCT 80-25 MG TABS (TELMISARTAN-HCTZ) 1 tablet by mouth once daily  #30 Tablet x 5   Entered and Authorized by:   D. Thomos Lemons DO   Signed by:   D. Thomos Lemons DO on 09/07/2010   Method used:   Electronically to        [facility] [phone]* (retail)       [address] [place], [place]  [address]       Ph: [phone]       Fax: [phone]   RxID:   [id number] FUROSEMIDE 40 MG  TABS (FUROSEMIDE) 1 and 1/2 tab by mouth q AM  #45 x 5   Entered and Authorized by:   D. Thomos Lemons DO   Signed by:   D. Thomos Lemons DO on 09/07/2010   Method used:   Electronically to        [facility]  [facility] [phone]* (retail)       [address] [place], [place]  [address]       Ph: [phone]       Fax: [phone]   RxID:   [id number]    Orders Added: 1)  T-Basic Metabolic Panel [id number] 2)  T-BNP  (B Natriuretic Peptide) [[id number]] 3)  T-Lipid Profile [80061-22930] 4)  T-TSH [61443-15400] 5)  T-PSA [86761-95093] 6)  T-Basic Metabolic Panel [[id number]] 7)  Est. Patient Level III [98338]     Current Allergies (reviewed today): No known allergies   Prevention & Chronic Care Immunizations   Influenza vaccine: Fluvax MCR  (05/11/2010)    Tetanus booster: 04/14/2008: Td    Pneumococcal vaccine: given  (04/16/2006)    H. zoster vaccine: Not documented  Colorectal Screening   Hemoccult: Not documented    Colonoscopy: normal  (10/06/2008)  Other Screening   PSA: Not documented   PSA ordered.   Smoking status: never   (09/07/2010)  Lipids   Total Cholesterol: 149  (05/31/2009)   LDL: 88  (05/31/2009)   LDL Direct: Not documented   HDL: 51  (05/31/2009)   Triglycerides: 50  (05/31/2009)  Hypertension   Last Blood Pressure: 142 / 70  (09/07/2010)   Serum creatinine: 0.99  (04/20/2010)   Serum potassium 4.1  (04/20/2010)  Self-Management Support :    Hypertension self-management support: Not documented

## 2010-10-06 NOTE — Consult Note (Signed)
Summary: [facility]  [facility]   Imported By: Maryln Gottron 09/29/2010 09:30:00  _____________________________________________________________________  External Attachment:    Type:   Image     Comment:   External Document

## 2010-10-06 NOTE — Letter (Signed)
Summary: [facility]  [facility]   Imported By: Lanelle Bal 09/28/2010 08:12:09  _____________________________________________________________________  External Attachment:    Type:   Image     Comment:   External Document

## 2010-10-06 NOTE — Assessment & Plan Note (Signed)
Summary: CONSULT DISCUSS ELEVATED PSA/DK   Vital Signs:  Patient profile:   70 year old male Height:      74 inches O2 Sat:      99 % on Room air Temp:     97.9 degrees F oral Pulse rate:   77 / minute Resp:     20 per minute BP sitting:   142 / 70  (right arm) Cuff size:   large  Vitals Entered By: Glendell Docker CMA (September 12, 2010 3:34 PM)  O2 Flow:  Room air  Primary Care Provider:  D. Thomos Lemons DO   History of Present Illness: 70 y/o AA male for f/u re:  elevated PSA  prev seen by PCP for elevated PSA - between 5-6 PSA followed.  no biopsies performed subsequent studies showed PSA mildly elevated 02/2008 - 3.37  pt not currently having any urinary symptoms  brother has hx of elevated PSA work up negative for cancer  Preventive Screening-Counseling & Management  Alcohol-Tobacco     Smoking Status: never  Allergies (verified): No Known Drug Allergies  Past History:  Past Medical History: hypokalemia Hypertension   Sleep apnea-occasionally wears CPAP at night gallstones s/p cholecystectomy  GERD Morbid obesity  H. pylori gastritis (Tx Pylera 4/10) Aortic insufficiency-moderate Enlarged aortic root      a. 02/16/09 Chest CT:                The aortic root has a diameter measuring 4.6 cm, image 64 of the coronal series.  The ascending                            thoracic aorta has a diameter of 3.8 cm, image 65 of the coronal series.  At the level of the aortic arch                  the thoracic aorta has a maximum diameter of 3.2 cm.  The descending thoracic aorta has a maximum                   diameter of 3.2 cm.  There is no evidence for aortic dissection.     b. Repeat CT chest 12/23/09: Aortic root 5.5 cm, ascending aorta 4.0cm.  Dilated Cardiomyopathy/NICM      a. 01/2009 - Cath - nonobs. dzs.  EF 35%. Normal on last echo. Chronic Syst. CHF Non-obstructive CAD     Family History: Family History of Breast Cancer:sisters Family History of Colon  Cancer:brother   Prostate Cancer-brother Family History of Diabetes: 5 siblings Family History of Stomach Cancer:sister Mother-died at 33 from low potassium, HTN Father-died at 71 from CVA Sister died at age 71 from kidney failure         Physical Exam  General:  alert, well-developed, and well-nourished.   Lungs:  normal respiratory effort and normal breath sounds.   Heart:  normal rate and regular rhythm.   Rectal:  no external abnormalities.   Prostate:  limited exam,  unable to fully palpate prostate gland   Impression & Recommendations:  Problem # 1:  PSA, INCREASED (ICD-790.93) PSA 7.32 arrange f/u with urology for mgt and surveillance  Orders: Urology Referral (Urology)  Complete Medication List: 1)  Klor-con M20 20 Meq Cr-tabs (Potassium chloride crys cr) .... 3  tabs by mouth  two times a day 2)  Hydralazine Hcl 50 Mg Tabs (Hydralazine hcl) .... Take 2 tabs every morning  and 1 tab at bedtime 3)  Micardis Hct 80-25 Mg Tabs (Telmisartan-hctz) .Marland Kitchen.. 1 tablet by mouth once daily 4)  Furosemide 40 Mg Tabs (Furosemide) .Marland Kitchen.. 1 and 1/2 tab by mouth q am 5)  Centrum Ultra Mens Tabs (Multiple vitamins-minerals) .Marland Kitchen.. 1 tab once daily 6)  Aspirin 81 Mg Tbec (Aspirin) .... Take one tablet by mouth daily 7)  Carvedilol 25 Mg Tabs (Carvedilol) .Marland Kitchen.. 1  tab two times a day  Patient Instructions: 1)  Our office will contract you re:  urology referral   Orders Added: 1)  Urology Referral [Urology] 2)  Est. Patient Level III [60454]    Current Allergies (reviewed today): No known allergies

## 2010-10-06 NOTE — Letter (Signed)
Summary: [facility]  [facility]   Imported By: Lanelle Bal 09/28/2010 08:11:13  _____________________________________________________________________  External Attachment:    Type:   Image     Comment:   External Document

## 2010-10-12 ENCOUNTER — Encounter: Payer: Self-pay | Admitting: Internal Medicine

## 2010-10-12 ENCOUNTER — Ambulatory Visit (INDEPENDENT_AMBULATORY_CARE_PROVIDER_SITE_OTHER): Payer: Medicare Other | Admitting: Internal Medicine

## 2010-10-12 DIAGNOSIS — I10 Essential (primary) hypertension: Secondary | ICD-10-CM

## 2010-10-17 ENCOUNTER — Other Ambulatory Visit: Payer: Self-pay | Admitting: Cardiovascular Disease

## 2010-10-17 DIAGNOSIS — I712 Thoracic aortic aneurysm, without rupture: Secondary | ICD-10-CM

## 2010-10-18 ENCOUNTER — Encounter: Payer: Self-pay | Admitting: Cardiovascular Disease

## 2010-10-18 ENCOUNTER — Other Ambulatory Visit: Payer: Self-pay | Admitting: Cardiovascular Disease

## 2010-10-18 ENCOUNTER — Other Ambulatory Visit (INDEPENDENT_AMBULATORY_CARE_PROVIDER_SITE_OTHER): Payer: Medicare Other

## 2010-10-18 DIAGNOSIS — I10 Essential (primary) hypertension: Secondary | ICD-10-CM

## 2010-10-18 LAB — BASIC METABOLIC PANEL
CO2: 31 mEq/L (ref 19–32)
Calcium: 8.9 mg/dL (ref 8.4–10.5)
Creatinine, Ser: 1 mg/dL (ref 0.4–1.5)
GFR: 97.63 mL/min (ref >60.00)
Glucose, Bld: 101 mg/dL — ABNORMAL HIGH (ref 70–99)
Sodium: 140 mEq/L (ref 135–145)

## 2010-10-19 ENCOUNTER — Other Ambulatory Visit: Payer: Medicare Other

## 2010-10-24 ENCOUNTER — Telehealth: Payer: Self-pay | Admitting: Internal Medicine

## 2010-11-01 NOTE — Assessment & Plan Note (Signed)
Summary: 1 MONTH FOLLOW UP /MHF--rm 3   Vital Signs:  Patient profile:   70 year old male Height:      74 inches Weight:      336 pounds BMI:     43.30 Temp:     97.8 degrees F oral Pulse rate:   78 / minute Pulse rhythm:   regular Resp:     18 per minute BP sitting:   152 / 74  (right arm) Cuff size:   large  Vitals Entered By: Mervin Kung CMA Duncan Dull) (October 12, 2010 9:06 AM) CC: pt here for 1 month follow up Is Patient Diabetic? No Pain Assessment Patient in pain? no        Primary Care Provider:  Dondra Spry DO  CC:  pt here for 1 month follow up.  History of Present Illness: 70 y/o male for follow up pt seen by urologist for elevated PSA DRE noted to be normal.  no nodules.  plan is to monitor PSA  he denies obstructive symptoms  htn - good med compliance   Preventive Screening-Counseling & Management  Alcohol-Tobacco     Alcohol drinks/day: 0     Alcohol Counseling: not indicated; patient does not drink     Smoking Status: never     Tobacco Counseling: not indicated; no tobacco use  Caffeine-Diet-Exercise     Caffeine use/day: 3 beverages per day     Caffeine Counseling: decrease use of caffeine     Does Patient Exercise: no  Allergies (verified): No Known Drug Allergies  Past History:  Past Medical History: hypokalemia Hypertension   Sleep apnea-occasionally wears CPAP at night gallstones s/p cholecystectomy   GERD  Morbid obesity  H. pylori gastritis (Tx Pylera 4/10) Aortic insufficiency-moderate Enlarged aortic root      a. 02/16/09 Chest CT:                The aortic root has a diameter measuring 4.6 cm, image 64 of the coronal series.  The ascending                            thoracic aorta has a diameter of 3.8 cm, image 65 of the coronal series.  At the level of the aortic arch                  the thoracic aorta has a maximum diameter of 3.2 cm.  The descending thoracic aorta has a maximum                   diameter of 3.2 cm.   There is no evidence for aortic dissection.     b. Repeat CT chest 12/23/09: Aortic root 5.5 cm, ascending aorta 4.0cm.  Dilated Cardiomyopathy/NICM      a. 01/2009 - Cath - nonobs. dzs.  EF 35%. Normal on last echo. Chronic Syst. CHF Non-obstructive CAD    PMH-FH-SH reviewed-no changes except otherwise noted  Physical Exam  General:  alert, well-developed, and well-nourished.   Lungs:  normal respiratory effort and normal breath sounds.   Heart:  normal rate and regular rhythm.   Extremities:  1+ left pedal edema and 1+ right pedal edema.     Impression & Recommendations:  Problem # 1:  PSA, INCREASED (ICD-790.93) Followed by urology  Problem # 2:  HYPERTENSION (ICD-401.9) Assessment: Deteriorated add amlodipine  His updated medication list for this problem includes:    Hydralazine Hcl  50 Mg Tabs (Hydralazine hcl) .Marland Kitchen... Take 2 tabs every morning and 1 tab at bedtime    Micardis Hct 80-25 Mg Tabs (Telmisartan-hctz) .Marland Kitchen... 1 tablet by mouth once daily    Furosemide 40 Mg Tabs (Furosemide) .Marland Kitchen... 1 and 1/2 tab by mouth q am    Carvedilol 25 Mg Tabs (Carvedilol) .Marland Kitchen... 1  tab two times a day    Amlodipine Besylate 5 Mg Tabs (Amlodipine besylate) ..... One by mouth once daily  BP today: 152/74 Prior BP: 142/70 (09/12/2010)  Labs Reviewed: K+: 4.0 (09/07/2010) Creat: : 1.00 (09/07/2010)   Chol: 136 (09/07/2010)   HDL: 38 (09/07/2010)   LDL: 85 (09/07/2010)   TG: 66 (09/07/2010)  Complete Medication List: 1)  Klor-con M20 20 Meq Cr-tabs (Potassium chloride crys cr) .... 3  tabs by mouth  two times a day 2)  Hydralazine Hcl 50 Mg Tabs (Hydralazine hcl) .... Take 2 tabs every morning and 1 tab at bedtime 3)  Micardis Hct 80-25 Mg Tabs (Telmisartan-hctz) .Marland Kitchen.. 1 tablet by mouth once daily 4)  Furosemide 40 Mg Tabs (Furosemide) .Marland Kitchen.. 1 and 1/2 tab by mouth q am 5)  Centrum Ultra Mens Tabs (Multiple vitamins-minerals) .Marland Kitchen.. 1 tab once daily 6)  Aspirin 81 Mg Tbec (Aspirin) .... Take one  tablet by mouth daily 7)  Carvedilol 25 Mg Tabs (Carvedilol) .Marland Kitchen.. 1  tab two times a day 8)  Amlodipine Besylate 5 Mg Tabs (Amlodipine besylate) .... One by mouth once daily  Patient Instructions: 1)  Please schedule a follow-up appointment in 3 months. 2)  Call our office in 2 weeks with home blood pressure readings. 3)  BMP prior to visit, ICD-9: 401.9 4)  Please return for lab work one (1) week before your next appointment.  Prescriptions: AMLODIPINE BESYLATE 5 MG TABS (AMLODIPINE BESYLATE) one by mouth once daily  #30 x 3   Entered and Authorized by:   D. Thomos Lemons DO   Signed by:   D. Thomos Lemons DO on 10/12/2010   Method used:   Electronically to        [facility]  [facility] [phone]* (retail)       [address] [place], [place]  [address]       Ph: [phone]       Fax: [phone]   RxID:   [id number]    Orders Added: 1)  Est. Patient Level III [24401]    Current Allergies (reviewed today): No known allergies

## 2010-11-01 NOTE — Progress Notes (Signed)
Summary: Status update on blood pressure  Phone Note Call from Patient Call back at Home Phone [phone]   Caller: Spouse- Steward Drone Call For: Dondra Spry DO Summary of Call: Patients wife Steward Drone called and left voice message to report patient blood pressure on March 1 was 144/58 Initial call taken by: Glendell Docker CMA,  October 24, 2010 11:16 AM  Follow-up for Phone Call        ptz have pt continue to monitor bp and call back after they obtain 4 additional readings (check BP once daily) Follow-up by: D. Thomos Lemons DO,  October 25, 2010 1:37 PM  Additional Follow-up for Phone Call Additional follow up Details #1::        call placed to patient at [phone], no answer. A detailed voice message was left advising patient per Dr Artist Pais instructions. Additional Follow-up by: Glendell Docker CMA,  October 25, 2010 2:52 PM

## 2010-11-04 ENCOUNTER — Ambulatory Visit (INDEPENDENT_AMBULATORY_CARE_PROVIDER_SITE_OTHER)
Admission: RE | Admit: 2010-11-04 | Discharge: 2010-11-04 | Disposition: A | Discharge status: Home / Self Care | Payer: Medicare Other | Source: Ambulatory Visit | Attending: Cardiovascular Disease | Admitting: Cardiovascular Disease

## 2010-11-04 DIAGNOSIS — I712 Thoracic aortic aneurysm, without rupture: Secondary | ICD-10-CM

## 2010-11-04 HISTORY — DX: Essential (primary) hypertension: I10

## 2010-11-04 MED ORDER — IOHEXOL 300 MG/ML  SOLN
100.0000 mL | Freq: Once | INTRAMUSCULAR | Status: AC | PRN
  Administered 2010-11-04: 100 mL via INTRAVENOUS

## 2010-11-07 ENCOUNTER — Telehealth: Payer: Self-pay | Admitting: Cardiovascular Disease

## 2010-11-17 NOTE — Progress Notes (Signed)
Summary: Pt returning call  Phone Note Call from Patient Call back at Home Phone [phone] [facility]     Caller: Patient Summary of Call: Pt returning call Initial call taken by: Judie Grieve,  November 07, 2010 8:44 AM  Follow-up for Phone Call        pt aware of CT Angiogram results. Whitney Maeola Sarah RN  November 07, 2010 9:00 AM  Follow-up by: Whitney Maeola Sarah RN,  November 07, 2010 9:00 AM

## 2010-11-28 ENCOUNTER — Telehealth (INDEPENDENT_AMBULATORY_CARE_PROVIDER_SITE_OTHER): Payer: Medicare Other | Admitting: Internal Medicine

## 2010-11-28 DIAGNOSIS — I10 Essential (primary) hypertension: Secondary | ICD-10-CM

## 2010-11-28 LAB — POCT I-STAT 3, VENOUS BLOOD GAS (G3P V)
Acid-Base Excess: 3 mmol/L — ABNORMAL HIGH (ref 0.0–2.0)
O2 Saturation: 62 %

## 2010-11-28 LAB — POCT I-STAT 3, ART BLOOD GAS (G3+)
Acid-Base Excess: 2 mmol/L (ref 0.0–2.0)
Bicarbonate: 25.9 mEq/L — ABNORMAL HIGH (ref 20.0–24.0)
O2 Saturation: 94 %
TCO2: 27 mmol/L (ref 0–100)
pO2, Arterial: 69 mmHg — ABNORMAL LOW (ref 80.0–100.0)

## 2010-11-28 MED ORDER — CARVEDILOL 25 MG PO TABS: 25.0000 mg | ORAL_TABLET | Freq: Two times a day (BID) | ORAL | Status: DC

## 2010-11-28 NOTE — Telephone Encounter (Signed)
rx refill sent to pharmacy 

## 2010-11-28 NOTE — Telephone Encounter (Signed)
Refill- carvedilol 25mg  tablet. Take 1 tablet by mouth twice a day. Qty 30. Last fill 3.3.12

## 2010-12-06 ENCOUNTER — Telehealth: Payer: Self-pay | Admitting: *Deleted

## 2010-12-06 ENCOUNTER — Telehealth (INDEPENDENT_AMBULATORY_CARE_PROVIDER_SITE_OTHER): Payer: Medicare Other | Admitting: Internal Medicine

## 2010-12-06 DIAGNOSIS — E876 Hypokalemia: Secondary | ICD-10-CM

## 2010-12-06 LAB — DIFFERENTIAL
Basophils Absolute: 0.1 10*3/uL (ref 0.0–0.1)
Eosinophils Relative: 3 % (ref 0–5)
Lymphocytes Relative: 29 % (ref 12–46)
Lymphs Abs: 1.7 10*3/uL (ref 0.7–4.0)
Monocytes Absolute: 0.7 10*3/uL (ref 0.1–1.0)

## 2010-12-06 LAB — COMPREHENSIVE METABOLIC PANEL
ALT: 39 U/L (ref 0–53)
AST: 33 U/L (ref 0–37)
Albumin: 3.6 g/dL (ref 3.5–5.2)
Chloride: 104 mEq/L (ref 96–112)
Creatinine, Ser: 1.1 mg/dL (ref 0.4–1.5)
GFR calc Af Amer: >60 mL/min (ref >60)
Sodium: 145 mEq/L (ref 135–145)
Total Bilirubin: 2.1 mg/dL — ABNORMAL HIGH (ref 0.3–1.2)

## 2010-12-06 LAB — URINE MICROSCOPIC-ADD ON

## 2010-12-06 LAB — URINALYSIS, ROUTINE W REFLEX MICROSCOPIC
Bilirubin Urine: NEGATIVE
Glucose, UA: NEGATIVE mg/dL
Ketones, ur: NEGATIVE mg/dL
pH: 6.5 (ref 5.0–8.0)

## 2010-12-06 LAB — CBC
MCV: 93 fL (ref 78.0–100.0)
Platelets: 256 10*3/uL (ref 150–400)
WBC: 6 10*3/uL (ref 4.0–10.5)

## 2010-12-06 MED ORDER — POTASSIUM CHLORIDE CRYS ER 20 MEQ PO TBCR: 20.0000 meq | EXTENDED_RELEASE_TABLET | Freq: Two times a day (BID) | ORAL | Status: DC

## 2010-12-06 NOTE — Telephone Encounter (Signed)
rx refill sent to pharmacy 

## 2010-12-06 NOTE — Telephone Encounter (Signed)
Patients wife called and left voice message stating patient has had body aches, chills and fever since Saturday, and does not seem to be feeling well. She wanted to know if patient should schedule office visit.  Call was returned to patient at [phone], no answer. A detailed voice message was left informing patient to schedule appointment for evaluation

## 2010-12-06 NOTE — Telephone Encounter (Signed)
Refill- klor-con *M** . Take 3 tablets by mouth twice a day. Qty 240.00. Last fill 3.18.12

## 2010-12-17 ENCOUNTER — Encounter: Payer: Self-pay | Admitting: Cardiovascular Disease

## 2010-12-17 ENCOUNTER — Encounter: Payer: Self-pay | Admitting: *Deleted

## 2010-12-19 ENCOUNTER — Telehealth (INDEPENDENT_AMBULATORY_CARE_PROVIDER_SITE_OTHER): Payer: Medicare Other | Admitting: Internal Medicine

## 2010-12-19 DIAGNOSIS — E876 Hypokalemia: Secondary | ICD-10-CM

## 2010-12-19 MED ORDER — POTASSIUM CHLORIDE CRYS ER 20 MEQ PO TBCR: EXTENDED_RELEASE_TABLET | ORAL | Status: DC

## 2010-12-19 NOTE — Telephone Encounter (Signed)
Pt states that he was originally taking potassium 3 in the morning and 3 at night. Pt states he refilled potassium and directions told him to take 1 in the morning and 1 at night. Pt wants to know which dosage he needs to take?

## 2010-12-19 NOTE — Telephone Encounter (Signed)
Call returned to patient at [phone], he was informed the Rx was corrected and re-submitted to pharmacy to reflect 3 tablets by mouth twice a day

## 2010-12-20 ENCOUNTER — Encounter: Payer: Self-pay | Admitting: Cardiovascular Disease

## 2010-12-20 ENCOUNTER — Ambulatory Visit (INDEPENDENT_AMBULATORY_CARE_PROVIDER_SITE_OTHER): Payer: Medicare Other | Admitting: Cardiovascular Disease

## 2010-12-20 VITALS — BP 158/70 | HR 73 | Resp 18 | Ht 73.0 in | Wt 332.4 lb

## 2010-12-20 DIAGNOSIS — I10 Essential (primary) hypertension: Secondary | ICD-10-CM

## 2010-12-20 DIAGNOSIS — I712 Thoracic aortic aneurysm, without rupture: Secondary | ICD-10-CM

## 2010-12-20 MED ORDER — AMLODIPINE BESYLATE 10 MG PO TABS: 10.0000 mg | ORAL_TABLET | Freq: Every day | ORAL | Status: DC

## 2010-12-20 NOTE — Patient Instructions (Signed)
Your physician recommends that you schedule a follow-up appointment in: 6 months with Dr. Clifton James  Your physician has requested that you have an echocardiogram. Echocardiography is a painless test that uses sound waves to create images of your heart. It provides your doctor with information about the size and shape of your heart and how well your heart's chambers and valves are working. This procedure takes approximately one hour. There are no restrictions for this procedure.  Your physician has recommended you make the following change in your medication: INCREASE NORVASC to 10 mg daily.

## 2010-12-20 NOTE — Assessment & Plan Note (Signed)
Stable. Repeat echo one month.

## 2010-12-20 NOTE — Assessment & Plan Note (Signed)
BP elevated. Will increase Norvasc to 10 mg po once daily.

## 2010-12-20 NOTE — Progress Notes (Signed)
History of Present Illness: 70 yo AAM with history of NICM, dilated aortic root, moderate AI, non-obstrucitve CAD, morbid obesity, obstructive sleep apnea, HTN and GERD who was  referred in May 2010 for SOB, abdominal swelling and LE edema. He has since been diagnosed with a non-ischemic CM, moderate AI, non-obstructive CAD and a dilated aortic root. He has undergone a right and left heart cath with non-obstructive coronary artery disease. A CT angiogram was performed in June 2010 to look closely at his aortic root and showed moderate dilation of the aortic root  (root 4.6 cm, ascending aorta 3.8cm). It is felt that his aortic insufficiency is likely related to the aortic root dilation. His LV is mildly dilated but LV function is now normal. Echo in the spring of 2011 showed enlarging aortic root. I planned on performing an MRI to look at his valve, degree of AI and size of root but he was too large for the scanner. Repeat CT angiogram chest 5/05/11showed enlarged aortic root  at 5.5cm with ascending aorta 4.0cm. Because of this, I referred him to Dr. Cornelius Moras with [facility]. Dr. Cornelius Moras recommended waiting for surgery until his AI worsened or his root enlarged more. CT chest march 2012 stable.    He is here today for follow up.  He has had no chest pain, SOB, DOE, palpitations, near syncope or syncope. He has been very active and walks several miles daily. He has been taking all of his medications. No complaints. Recently started on amlodipine 5mg  once daily.   Past Medical History  Diagnosis Date  . Hypertension   . Aortic insufficiency      02/16/09 Chest CT:  The aortic root has a diameter measuring 4.6 cm, image 64 of the coronal series.  The ascending    thoracic aorta has a diameter of 3.8 cm, image 65 of the coronal series.  At the level of the aortic arch    the thoracic aorta has a maximum diameter of 3.2 cm.  The descending thoracic aorta has a maximum           diameter of 3.2 cm.  There is no evidence for  aortic disse        . Obesity   . GERD (gastroesophageal reflux disease)   . Hypokalemia   . Helicobacter pylori gastritis     (Tx Pylera 4/10)  . Cardiomyopathy      01/2009 - Cath - nonobs. dzs.  EF 35%.  . Gallstones     s/p cholecystectomy    . Sleep apnea     occasionally wears CPAP at night  . CAD (coronary artery disease)   . Aortic root enlargement     The aortic root has a diameter measuring 4.6 cm, image 64 of the coronal series.  The ascending  thoracic aorta has a diameter of 3.8 cm, image 65 of the coronal series.  At the level of the aortic arch   the thoracic aorta has a maximum diameter of 3.2 cm.  The descending thoracic aorta has a maximum   diameter of 3.2 cm.  There is no evidence for aortic dissection.         Past Surgical History  Procedure Date  . Cholecystectomy   . Adrenalectomy     Current Outpatient Prescriptions  Medication Sig Dispense Refill  . amLODipine (NORVASC) 5 MG tablet Take 5 mg by mouth daily.        Marland Kitchen aspirin 81 MG tablet Take 81 mg by  mouth daily.        . carvedilol (COREG) 25 MG tablet Take 1 tablet (25 mg total) by mouth 2 (two) times daily.  60 tablet  3  . furosemide (LASIX) 40 MG tablet Take 40 mg by mouth daily.       . hydrALAZINE (APRESOLINE) 100 MG tablet 100 mg. 2 tabs in the morning and 1 tab in the pm      . Multiple Vitamin (MULTIVITAMIN) capsule Take 1 capsule by mouth daily.        . potassium chloride SA (KLOR-CON M20) 20 MEQ tablet Take three tablets by mouth twice a day  240 tablet  3  . telmisartan-hydrochlorothiazide (MICARDIS HCT) 80-25 MG per tablet Take 1 tablet by mouth daily.          Allergies not on file  History   Social History  . Marital Status: Married    Spouse Name: N/A    Number of Children: N/A  . Years of Education: N/A   Occupational History  . Not on file.   Social History Main Topics  . Smoking status: Not on file  . Smokeless tobacco: Not on file  . Alcohol Use: No  . Drug Use: No  .  Sexually Active: Not on file   Other Topics Concern  . Not on file   Social History Narrative   The patient is married, lives with his wife in  [place].  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  He  lives a very sedentary lifestyle.  He is a nonsmoker.  He denies alcohol  consumption.     Family History  Problem Relation Age of Onset  . Breast cancer    . Colon cancer    . Prostate cancer    . Diabetes    . Cancer    . Hypertension Mother   . Kidney failure Sister     Review of Systems:  As stated in the HPI and otherwise negative.   BP 158/70  Pulse 73  Resp 18  Ht 6\' 1"  (1.854 m)  Wt 332 lb 6.4 oz (150.776 kg)  BMI 43.85 kg/m2  Physical Examination: General: Well developed, well nourished, NAD HEENT: OP clear, mucus membranes moist SKIN: warm, dry. No rashes. Neuro: No focal deficits Musculoskeletal: Muscle strength 5/5 all ext Psychiatric: Mood and affect normal Neck: No JVD, no carotid bruits, no thyromegaly, no lymphadenopathy. Lungs:Clear bilaterally, no wheezes, rhonci, crackles Cardiovascular: Regular rate and rhythm. No murmurs, gallops or rubs. Abdomen:Soft. Bowel sounds present. Non-tender.  Extremities: No lower extremity edema. Pulses are 2 + in the bilateral DP/PT.  CT chest 11/04/10:    The aortic root measures 5.5 cm, unchanged on image 58.   The ascending aorta measures the ascending aorta measures 4.0 cm at   the level of the main pulmonary artery image 48, also unchanged.   Scattered atherosclerotic vascular disease is noted.  No aortic   dissection is identified.  Heart size is mildly enlarged.  No   pleural or pericardial effusion.  No axillary, hilar or mediastinal   lymphadenopathy.  Lungs are clear.  Incidentally imaged upper   abdomen shows surgical clips about the right kidney, unchanged.   Visualized abdominal viscera is otherwise unremarkable.  There is   no focal bony abnormality.  EKG: NSR, 1st degree av block.  LVH

## 2010-12-26 ENCOUNTER — Ambulatory Visit (HOSPITAL_COMMUNITY): Payer: Medicare Other | Attending: Cardiovascular Disease | Admitting: Radiology

## 2010-12-26 DIAGNOSIS — I359 Nonrheumatic aortic valve disorder, unspecified: Secondary | ICD-10-CM

## 2010-12-26 DIAGNOSIS — I712 Thoracic aortic aneurysm, without rupture, unspecified: Secondary | ICD-10-CM | POA: Insufficient documentation

## 2010-12-28 ENCOUNTER — Encounter: Payer: Self-pay | Admitting: Internal Medicine

## 2011-01-03 NOTE — Cardiovascular Report (Signed)
NAMEJHASE, CREPPEL NO.:  [id number]   MEDICAL RECORD NO.:  [id number]          PATIENT TYPE:  OIB   LOCATION:  1961                         FACILITY:  [facility]   PHYSICIAN:  Verne Carrow, MDDATE OF BIRTH:  1941-04-14   DATE OF PROCEDURE:  02/02/2009  DATE OF DISCHARGE:  02/02/2009                            CARDIAC CATHETERIZATION   PRIMARY CARDIOLOGIST:  Verne Carrow, MD   PROCEDURES PERFORMED:  1. Left heart catheterization.  2. Selective coronary angiography.  3. Left ventricular angiogram.  4. Right heart catheterization.  5. Aortic root angiogram.   OPERATOR:  Verne Carrow, MD   INDICATIONS:  This is a pleasant 70 year old African American male with  a history of recently diagnosed left ventricular dysfunction as well as  moderate aortic insufficiency and enlarged aortic root.  He is brought  in today for a diagnostic left and right heart catheterization to assess  his pulmonary artery pressures as well as to rule out obstructive  coronary artery disease.   HEMODYNAMIC FINDINGS:  Central aortic pressure 141/78.  Left ventricular  pressure 143/18.  Left ventricular end-diastolic pressure 29.  Right  atrial pressure mean of 15.  Right ventricular pressure 67/12.  Right  ventricular end-diastolic pressure 17.  Pulmonary artery pressure 65/34.  Mean pulmonary artery pressure 48.  Pulmonary capillary wedge pressure  mean of 22.  Aortic saturation 94%.  Pulmonary artery saturation 62%.  Cardiac output 5 L/min.  Cardiac index 2 L/min/m2 (by Fick method).   ANGIOGRAPHIC FINDINGS:  1. The left main coronary artery has mild calcification and mild      nonobstructive plaque.  2. The circumflex artery gives off a large early marginal branch that      is free of any significant disease.  There is a 20% plaque noted in      the proximal and mid circumflex artery.  There is no obstructive      disease noted in the vessel.  3. Left anterior  descending is a large vessel that coursed to the apex      and gives off moderate-sized diagonal branch.  There is mild 20%      plaque noted in the proximal and mid LAD.  The first diagonal      branch has a 30% ostial lesion.  There is no obstructive disease      noted in this vessel.  4. The right coronary artery is a large dominant vessel that has      serial 20% stenoses throughout the proximal and mid vessel.  There      is no obstructive disease in this vessel.  5. Left ventricular angiogram was performed in the RAO projection that      shows inferior wall akinesis.  Overall ejection fraction was 35%.      There is no significant mitral regurgitation noted.  6. Aortic root angiogram shows an enlarged aortic root.  There is      moderate aortic insufficiency noted.   IMPRESSION:  1. Mild nonobstructive coronary artery disease.  2. Segmental left ventricular dysfunction.  3. Nonischemic cardiomyopathy.  4. Enlarged  aortic root.  5. Moderate aortic insufficiency.  6. Elevated pulmonary pressures.   RECOMMENDATIONS:  I will plan on seeing the patient back in my clinic in  2-3 weeks.  We will check a metabolic profile at that time and we will  arrange for CT angiogram to evaluate his aortic root.  I think that his  left ventricular dysfunction may be secondary to his aortic  insufficiency.  His aortic insufficiency is most likely secondary to the  leaflets, not coapting properly secondary to the enlarged aortic root.  The patient will be monitored closely and will have 3 hours of bedrest  today.  He will be discharged to home if he feels good after his  bedrest.      Verne Carrow, MD  Electronically Signed     CM/MEDQ  D:  02/02/2009  T:  02/02/2009  Job:  [id number]

## 2011-01-03 NOTE — Consult Note (Signed)
NEW PATIENT CONSULTATION   Kook, BENJY KANA  DOB:  04/01/1941                                        January 21, 2010  CHART #:  [id number]   REASON FOR CONSULTATION:  Aortic insufficiency.   HISTORY OF PRESENT ILLNESS:  The patient is a 70 year old morbidly obese  African American male from [place] with history of aortic  insufficiency, annuloaortic ectasia, hypertension, GE reflux disease,  and obstructive sleep apnea.  The patient's cardiac history dates back  to 2010 when he was being evaluated by Dr. Stan Head of the [facility]  Gastroenterology team for symptoms of abdominal swelling, discomfort,  associated with shortness of breath and lower extremity edema.  The  patient's symptoms were thought potentially to be related to congestive  heart failure.  He had recently been treated for H.  pylori gastritis  and his symptoms did not improve.  He was referred to Dr. Verne Carrow who first saw him in May 2010.  Echocardiogram performed at  that time demonstrated nonischemic cardiomyopathy with moderate aortic  insufficiency.  The patient underwent left and right heart  catheterization at that time, confirming nonobstructive coronary artery  disease.  CT angiogram of the chest revealed moderate annuloaortic  ectasia with dilatation of the aortic root.  The patient was noted to  have moderate aortic insufficiency.  He was started on diuretics therapy  and more stepped up medication for treatment of hypertension.  His  symptoms all completely resolved.  The patient was recently seen back in  follow up by Dr. Clifton James and was doing well.  Repeat CT angiogram of  the chest confirmed stable enlargement of the aortic root.  Followup  echocardiogram revealed stable moderate aortic insufficiency.  Left  ventricular systolic function appeared normal with ejection fraction  55%.  The patient has been referred to consider elective surgical  intervention.   REVIEW OF SYSTEMS:  GENERAL:  The patient reports overall feeling well.  He has long-standing history of problems with being overweight.  He is 6  feet 2 inches and weighs at least 350 pounds.  CARDIAC:  The patient denies any chest pain, chest tightness, chest  pressure either with activity or at rest.  The patient states that since  he went on diuretic therapy last year he has felt very well and he no  longer has any problems with shortness of breath.  He admits that he  does not exert himself much physically, but he specifically denies any  shortness of breath with activity.  He denies any PND, orthopnea, tachy  palpitations, syncope.  The patient has mild bilateral lower extremity  edema that is improved since he has been on diuretic therapy.  RESPIRATORY:  Notable in that the patient has history of obstructive  sleep apnea.  He occasionally uses CPAP at night, but he feels  claustrophobic using machine.  He denies productive cough, hemoptysis,  wheezing.  GASTROINTESTINAL:  Notable for some history of symptoms of reflux.  Symptoms have been controlled on medical therapy for the past year.  The  patient reports normal bowel function.  GENITOURINARY:  Negative.  HEENT:  Negative.  PERIPHERAL VASCULAR:  Negative.  PSYCHIATRIC:  Negative.  INFECTIOUS:  Negative.   PAST MEDICAL HISTORY:  1. Aortic insufficiency, moderate.  2. Congestive heart failure, chronic diastolic.  3. Annuloaortic ectasia.  4. Hypertension.  5. Morbid obesity.  6. Obstructive sleep apnea.  7. GE reflux disease.  8. Hypokalemia.  9. H. pylori gastritis.  10.Dilated nonischemic cardiomyopathy.   PAST SURGICAL HISTORY:  1. Cholecystectomy.  2. Adrenalectomy for pheochromocytoma.   FAMILY HISTORY:  Noncontributory and notable for the absence of any  family members with history of Marfan syndrome, Ehlers-Danlos syndrome,  or other collagen vascular disorders or aortopathies.   SOCIAL HISTORY:  The patient  is married, lives with his wife in  [place].  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  He  lives a very sedentary lifestyle.  He is a nonsmoker.  He denies alcohol  consumption.   CURRENT MEDICATIONS:  1. Micardis HCT 80/25 one tablet daily.  2. Furosemide 40 mg daily.  3. Carvedilol 12.5 mg twice daily.  4. Aspirin 81 mg daily.  5. Hydralazine 100 mg every morning, 50 mg every evening.  6. Potassium chloride 60 mEq twice daily.  7. Multivitamin.   DRUG ALLERGIES:  None known.   PHYSICAL EXAMINATION:  General:  The patient is a well-appearing  morbidly obese male who appears his stated age in no acute distress.  Vital Signs:  Blood pressure 147/75, pulse 78, oxygen saturation 95% on  room air.  He is 6 feet 2 inches tall and weighs 345 pounds.  HEENT:  Unrevealing.  Neck:  Supple.  There is no cervical nor supraclavicular  lymphadenopathy.  There is no jugular venous distention.  There are no  carotid bruits.  Chest:  Auscultation of the chest reveals clear breath  sounds which are symmetrical bilaterally.  Breath sounds are diminished  at both lung bases.  No wheezes, rales, or rhonchi are noted.  Cardiovascular:  Notable for regular rate and rhythm with frequent  ectopic beats.  There is a soft diastolic murmur heard along the sternal  border that is intermittent.  Abdomen:  Very obese, soft, and nontender.  There are no obvious masses.  Extremities:  Warm and well perfused.  Distal pulses are not palpable.  There is moderate bilateral lower  extremity edema.  There is no sign of venous stasis disease.  Rectal and  GU:  Both deferred.  Neurologic:  Grossly nonfocal and symmetrical  throughout.   DIAGNOSTIC TESTS:  CT angiogram of the chest performed on Dec 23, 2009,  is reviewed and compared with previous CT angiogram of the chest from  June 2010.  This demonstrates mild annuloaortic ectasia involving the  aortic root.  The radiologist who interpreted the scan  suggested that  the proximal aortic root and measured in excess of 5.5 cm.  The notation  measurement that is made is tangentially at an angle and clearly does  not represent a true diameter.  At most, the greatest diameter in the  level of the midportion of the sinuses of Valsalva is in the  neighborhood of 5.2 to 5.3 cm, and at the sinotubular junction it is  clearly less than 5 cm.  The ascending aorta is even smaller, and the  descending thoracic aorta at the same level is 3.4 cm in this relatively  large patient.   A 2-D echocardiogram performed on Dec 21, 2009, is reviewed.  This  demonstrates normal left ventricular systolic function.  There was very  mild left ventricular chamber enlargement, probably not significant.  There was by report moderate aortic regurgitation.  The aortic valve was  not well visualized.  No other significant abnormalities are noted.  Enlarged aortic root was appreciated with aortic root diameter in excess  of 5 cm.   Catheterization films from 2010 will not pull up on the [employer]  system for review, but reportedly the patient had non-obstructive  atherosclerotic coronary artery disease at that time.   IMPRESSION:  The patient has moderate annuloaortic ectasia with moderate  aortic insufficiency.  He does not really have an aneurysm of the  ascending thoracic aorta at all.  He is asymptomatic on medical therapy  for hypertension, diastolic dysfunction, and moderate aortic  insufficiency.  At this point, in the absence of severe aortic  insufficiency I do not feel that he had definitive indications for  surgical intervention.  I agree that he will certainly be at risk for  progression, and I would rather operate on him while he remains in  reasonably good shape then wait until he develops decompensated left  ventricular function.  Nevertheless, at this point the severity of the  aortic insufficiency does not appear to be bad enough to mandate   surgical intervention based upon his 2-D echocardiograms.  Transesophageal echocardiogram might be helpful to more definitively  characterize the severity of aortic insufficiency and confirm whether or  not there is any significant anatomical abnormality of the valve itself  other than the presence of annuloaortic ectasia.  If the patient's valve  is in fact otherwise normal in appearance, he may ultimately be a  candidate for valve sparing aortic root replacement.  On the other hand,  in the presence of any significant structural leaflet abnormality,  aortic root replacement with replacement of the valve may be necessary.   RECOMMENDATIONS:  In the absence of severe aortic insufficiency and/or  enlargement of the aortic root on serial exams, I recommend continued  medical therapy with very close medical follow up to consist of 2-D  echocardiograms performed at least every 6 months or so.  At some point,  a transesophageal echocardiogram would be useful to more carefully exam  the functional anatomy of the aortic valve and make sure that the  patient does not have severe aortic insufficiency.  He should probably  continue to have CT angiograms of his heart to reevaluate the size of  the aortic root on an annual basis.  Again, he does not really have an  aneurysm of the ascending thoracic aorta, but rather simple annuloaortic  ectasia that by itself does not meet criteria to require surgical  intervention at this time.  We will plan to see the patient back in 1  year for follow up.   Salvatore Decent. Cornelius Moras, M.D.  Electronically Signed   CHO/MEDQ  D:  01/21/2010  T:  01/22/2010  Job:  [id number]   cc:   Verne Carrow, MD  Iva Boop, MD,FACG  Barbette Hair. Artist Pais, DO

## 2011-01-11 ENCOUNTER — Ambulatory Visit (INDEPENDENT_AMBULATORY_CARE_PROVIDER_SITE_OTHER): Payer: Medicare Other | Admitting: Internal Medicine

## 2011-01-11 ENCOUNTER — Encounter: Payer: Self-pay | Admitting: Internal Medicine

## 2011-01-11 ENCOUNTER — Telehealth: Payer: Self-pay | Admitting: Internal Medicine

## 2011-01-11 VITALS — BP 130/56 | HR 81 | Temp 98.1°F | Resp 20

## 2011-01-11 DIAGNOSIS — E669 Obesity, unspecified: Secondary | ICD-10-CM

## 2011-01-11 DIAGNOSIS — B351 Tinea unguium: Secondary | ICD-10-CM

## 2011-01-11 DIAGNOSIS — I10 Essential (primary) hypertension: Secondary | ICD-10-CM

## 2011-01-11 NOTE — Telephone Encounter (Signed)
PLEASE FAX LAB ORDER FOR BMET TO [facility] FOR WEEK OF September 17

## 2011-01-11 NOTE — Telephone Encounter (Signed)
Lab order entered.

## 2011-01-11 NOTE — Progress Notes (Signed)
Subjective:    Patient ID: David Parsons, male    DOB: 1941-07-12, 70 y.o.   MRN: [id number]  HPI    Review of Systems     Past Medical History  Diagnosis Date  . Hypertension   . Aortic insufficiency      02/16/09 Chest CT:  The aortic root has a diameter measuring 4.6 cm, image 64 of the coronal series.  The ascending    thoracic aorta has a diameter of 3.8 cm, image 65 of the coronal series.  At the level of the aortic arch    the thoracic aorta has a maximum diameter of 3.2 cm.  The descending thoracic aorta has a maximum           diameter of 3.2 cm.  There is no evidence for aortic disse        . Obesity   . GERD (gastroesophageal reflux disease)   . Hypokalemia   . Helicobacter pylori gastritis     (Tx Pylera 4/10)  . Cardiomyopathy      01/2009 - Cath - nonobs. dzs.  EF 35%.  . Gallstones     s/p cholecystectomy    . Sleep apnea     occasionally wears CPAP at night  . CAD (coronary artery disease)   . Aortic root enlargement     The aortic root has a diameter measuring 4.6 cm, image 64 of the coronal series.  The ascending  thoracic aorta has a diameter of 3.8 cm, image 65 of the coronal series.  At the level of the aortic arch   the thoracic aorta has a maximum diameter of 3.2 cm.  The descending thoracic aorta has a maximum   diameter of 3.2 cm.  There is no evidence for aortic dissection.         History   Social History  . Marital Status: Married    Spouse Name: Steward Drone    Number of Children: 0  . Years of Education: N/A   Occupational History  .      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 Smoking status: Never Smoker   . Smokeless tobacco: Not on file  . Alcohol Use: No  . Drug Use: No  . Sexually Active: Not on file   Other Topics Concern  . Not on file   Social History Narrative   The patient is married, lives with his wife in  [place].  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  He  lives a very sedentary  lifestyle.  He is a nonsmoker.  He denies alcohol consumption.     Past Surgical History  Procedure Date  . Cholecystectomy   . Adrenalectomy     Family History  Problem Relation Age of Onset  . Breast cancer    . Colon cancer    . Prostate cancer    . Diabetes    . Cancer      Stomach Cancer  . Hypertension Mother   . Kidney failure Sister   . Stroke Father     No Known Allergies  Current Outpatient Prescriptions on File Prior to Visit  Medication Sig Dispense Refill  . amLODipine (NORVASC) 10 MG tablet Take 1 tablet (10 mg total) by mouth daily.  30 tablet  8  . aspirin 81 MG tablet Take 81 mg by mouth daily.        . carvedilol (COREG) 25 MG tablet  Take 1 tablet (25 mg total) by mouth 2 (two) times daily.  60 tablet  3  . furosemide (LASIX) 40 MG tablet Take 40 mg by mouth daily.       . hydrALAZINE (APRESOLINE) 100 MG tablet 100 mg. 2 tabs in the morning and 1 tab in the pm      . Multiple Vitamin (MULTIVITAMIN) capsule Take 1 capsule by mouth daily.        . potassium chloride SA (KLOR-CON M20) 20 MEQ tablet Take three tablets by mouth twice a day  240 tablet  3  . telmisartan-hydrochlorothiazide (MICARDIS HCT) 80-25 MG per tablet Take 1 tablet by mouth daily.          BP 130/56  Pulse 81  Temp(Src) 98.1 F (36.7 C) (Oral)  Resp 20  SpO2 95%    Objective:   Physical Exam     Nails - thickened discolored nail (bilateral) Skin - mild lichenification of skin around right groin area and scrotum    Assessment & Plan:

## 2011-01-11 NOTE — Patient Instructions (Addendum)
Use OTC lamisil cream to groin area x 2 weeks when you are symptomatic Keep groin area dry with OTC antifungal powder Please complete the following lab tests before your next follow up appointment: BMET 401.9 Goal weight loss - at least 10 lbs before your next office visit

## 2011-02-03 ENCOUNTER — Other Ambulatory Visit: Payer: Self-pay | Admitting: Family

## 2011-02-09 ENCOUNTER — Ambulatory Visit: Payer: Medicare Other | Admitting: *Deleted

## 2011-02-13 ENCOUNTER — Encounter: Payer: Medicare Other | Admitting: Thoracic Surgery (Cardiothoracic Vascular Surgery)

## 2011-02-13 ENCOUNTER — Ambulatory Visit (INDEPENDENT_AMBULATORY_CARE_PROVIDER_SITE_OTHER): Payer: Medicare Other | Admitting: Thoracic Surgery (Cardiothoracic Vascular Surgery)

## 2011-02-13 ENCOUNTER — Encounter: Payer: Self-pay | Admitting: Thoracic Surgery (Cardiothoracic Vascular Surgery)

## 2011-02-13 VITALS — BP 117/65 | HR 81 | Resp 18

## 2011-02-13 DIAGNOSIS — I712 Thoracic aortic aneurysm, without rupture: Secondary | ICD-10-CM

## 2011-02-13 NOTE — Assessment & Plan Note (Signed)
Follow-up CTA 1 year

## 2011-02-13 NOTE — Progress Notes (Signed)
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 He was originally seen in consultation on 01/21/2010, and a formal consultation report was dictated at that time. Over the past year David Parsons has continued to do very well. He specifically denies any symptoms of exertional shortness of breath. His blood pressure control has been brought under excellent control. He continues to followup with routine echocardiograms.  CT angiogram of the chest performed 11/04/2010 was reviewed. This skin reveals no significant change in the dilated aortic root and proximal descending thoracic aorta when compared with scan from May 2011. There has been no interval change in size. No abnormalities are noted.  2-D echocardiogram performed 12/26/2010 was reviewed. By report there remains moderate aortic insufficiency. The aortic insufficiency was quantified is not specified. The dimensions of the left ventricle remained unchanged. The left ventricular end-diastolic diameter remains below 6 cm. The left ventricular end-systolic diameter was estimated 31 mm. Left ventricular systolic function remains fairly normal. Ejection fraction was estimated at 55-60%. The aortic valve leaflet morphology is felt to be tricuspid.  Impression: The patient remains clinically stable with moderate aortic insufficiency which appears to be asymptomatic. Left ventricular systolic function is fairly normal. There has been no interval increase in size in the left ventricular chamber dimensions. The dilatation of the aortic root and proximal descending thoracic aorta remained stable. At present there are no clear indications for surgical intervention.  Plan: The patient will continue to follow the with his cardiologist and primary care physician. We will see him back in one year's time with followup CT angiogram of the chest.

## 2011-04-10 ENCOUNTER — Other Ambulatory Visit: Payer: Self-pay | Admitting: Internal Medicine

## 2011-04-10 NOTE — Telephone Encounter (Signed)
Rx refill sent to pharmacy. 

## 2011-05-15 ENCOUNTER — Ambulatory Visit: Payer: Medicare Other | Admitting: Internal Medicine

## 2011-05-15 ENCOUNTER — Encounter: Payer: Self-pay | Admitting: Internal Medicine

## 2011-05-15 ENCOUNTER — Ambulatory Visit (INDEPENDENT_AMBULATORY_CARE_PROVIDER_SITE_OTHER): Payer: Medicare Other | Admitting: Internal Medicine

## 2011-05-15 DIAGNOSIS — I10 Essential (primary) hypertension: Secondary | ICD-10-CM

## 2011-05-15 DIAGNOSIS — I2510 Atherosclerotic heart disease of native coronary artery without angina pectoris: Secondary | ICD-10-CM

## 2011-05-15 DIAGNOSIS — R739 Hyperglycemia, unspecified: Secondary | ICD-10-CM

## 2011-05-15 DIAGNOSIS — Z23 Encounter for immunization: Secondary | ICD-10-CM

## 2011-05-15 DIAGNOSIS — E785 Hyperlipidemia, unspecified: Secondary | ICD-10-CM

## 2011-05-15 DIAGNOSIS — R7309 Other abnormal glucose: Secondary | ICD-10-CM

## 2011-05-15 LAB — CBC
HCT: 29.7 % — ABNORMAL LOW (ref 39.0–52.0)
MCV: 74.6 fL — ABNORMAL LOW (ref 78.0–100.0)
RBC: 3.98 MIL/uL — ABNORMAL LOW (ref 4.22–5.81)
WBC: 7.1 10*3/uL (ref 4.0–10.5)

## 2011-05-15 LAB — HEPATIC FUNCTION PANEL
ALT: 9 U/L (ref 0–53)
Albumin: 4 g/dL (ref 3.5–5.2)
Bilirubin, Direct: 0.2 mg/dL (ref 0.0–0.3)
Total Bilirubin: 0.6 mg/dL (ref 0.3–1.2)

## 2011-05-15 LAB — LIPID PANEL
Cholesterol: 123 mg/dL (ref 0–200)
Total CHOL/HDL Ratio: 3.7 Ratio
VLDL: 13 mg/dL (ref 0–40)

## 2011-05-15 LAB — BASIC METABOLIC PANEL
BUN: 13 mg/dL (ref 6–23)
CO2: 27 mEq/L (ref 19–32)
Chloride: 106 mEq/L (ref 96–112)
Creat: 0.98 mg/dL (ref 0.50–1.35)

## 2011-05-15 NOTE — Progress Notes (Signed)
  Subjective:    Patient ID: David Parsons, male    DOB: Jul 10, 1941, 70 y.o.   MRN: [id number]  HPI Pt presents to clinic for followup of multiple medical problems. Wt stable. H/o CAD not on statin tx. H/o glucose intolerance without DM. No active complaints.  Past Medical History  Diagnosis Date  . Hypertension   . Aortic insufficiency      02/16/09 Chest CT:  The aortic root has a diameter measuring 4.6 cm, image 64 of the coronal series.  The ascending    thoracic aorta has a diameter of 3.8 cm, image 65 of the coronal series.  At the level of the aortic arch    the thoracic aorta has a maximum diameter of 3.2 cm.  The descending thoracic aorta has a maximum           diameter of 3.2 cm.  There is no evidence for aortic disse        . Obesity   . GERD (gastroesophageal reflux disease)   . Hypokalemia   . Helicobacter pylori gastritis     (Tx Pylera 4/10)  . Cardiomyopathy      01/2009 - Cath - nonobs. dzs.  EF 35%.  . Gallstones     s/p cholecystectomy    . Sleep apnea     occasionally wears CPAP at night  . CAD (coronary artery disease)   . Aortic root enlargement     The aortic root has a diameter measuring 4.6 cm, image 64 of the coronal series.  The ascending  thoracic aorta has a diameter of 3.8 cm, image 65 of the coronal series.  At the level of the aortic arch   the thoracic aorta has a maximum diameter of 3.2 cm.  The descending thoracic aorta has a maximum   diameter of 3.2 cm.  There is no evidence for aortic dissection.        Past Surgical History  Procedure Date  . Cholecystectomy   . Adrenalectomy     reports that he has never smoked. He does not have any smokeless tobacco history on file. He reports that he does not drink alcohol or use illicit drugs. family history includes Breast cancer in an unspecified family member; Cancer in an unspecified family member; Colon cancer in an unspecified family member; Diabetes in an unspecified family member; Hypertension in his  mother; Kidney failure in his sister; Prostate cancer in an unspecified family member; and Stroke in his father. No Known Allergies   Review of Systems see hpi     Objective:   Physical Exam  Physical Exam  Nursing note and vitals reviewed. Constitutional: Appears well-developed and well-nourished. No distress.  HENT:  Head: Normocephalic and atraumatic.  Right Ear: External ear normal.  Left Ear: External ear normal.  Eyes: Conjunctivae are normal. No scleral icterus.  Neck: Neck supple. Carotid bruit is not present.  Cardiovascular: Normal rate, regular rhythm and normal heart sounds.  Exam reveals no gallop and no friction rub.   No murmur heard. Pulmonary/Chest: Effort normal and breath sounds normal. No respiratory distress. He has no wheezes. no rales.  Lymphadenopathy:    He has no cervical adenopathy.  Neurological:Alert.  Skin: Skin is warm and dry. Not diaphoretic.  Psychiatric: Has a normal mood and affect.       Assessment & Plan:

## 2011-05-21 NOTE — Assessment & Plan Note (Signed)
Obtain lipid/lft. 

## 2011-05-21 NOTE — Assessment & Plan Note (Signed)
Obtain chem7 and a1c 

## 2011-05-23 ENCOUNTER — Other Ambulatory Visit: Payer: Self-pay | Admitting: Internal Medicine

## 2011-05-23 ENCOUNTER — Telehealth: Payer: Self-pay | Admitting: Internal Medicine

## 2011-05-23 DIAGNOSIS — D539 Nutritional anemia, unspecified: Secondary | ICD-10-CM

## 2011-05-23 DIAGNOSIS — D649 Anemia, unspecified: Secondary | ICD-10-CM

## 2011-05-23 NOTE — Telephone Encounter (Signed)
Patient states that he had questions regarding his Iron?

## 2011-05-23 NOTE — Telephone Encounter (Signed)
Call returned to patient [phone]. He was requesting clarification on medication for iron. He was informed once the labs were resulted out for his iron studies, he would get a call back about taking the iron tablets. Patient verbalized understanding and agrees.

## 2011-05-24 LAB — CBC
MCV: 74.6 fL — ABNORMAL LOW (ref 78.0–100.0)
Platelets: 357 10*3/uL (ref 150–400)
RBC: 3.9 MIL/uL — ABNORMAL LOW (ref 4.22–5.81)
WBC: 7.8 10*3/uL (ref 4.0–10.5)

## 2011-05-24 LAB — BASIC METABOLIC PANEL
Potassium: 3.7 mEq/L (ref 3.5–5.3)
Sodium: 139 mEq/L (ref 135–145)

## 2011-05-24 LAB — RETICULOCYTES: ABS Retic: 97.5 10*3/uL (ref 19.0–186.0)

## 2011-05-29 ENCOUNTER — Telehealth: Payer: Self-pay | Admitting: *Deleted

## 2011-05-29 MED ORDER — FERROUS SULFATE 325 (65 FE) MG PO TABS: 325.0000 mg | ORAL_TABLET | Freq: Two times a day (BID) | ORAL | Status: DC

## 2011-05-29 NOTE — Telephone Encounter (Signed)
Call placed to patient he was advised of lab results and Rx to pharmacy.

## 2011-05-29 NOTE — Telephone Encounter (Signed)
yes

## 2011-05-29 NOTE — Telephone Encounter (Signed)
Patient had repeat blood work for iron studies, and pending Rx for Iron. Does he need to proceed with taking the Rx iron?

## 2011-06-05 ENCOUNTER — Other Ambulatory Visit: Payer: Self-pay | Admitting: Internal Medicine

## 2011-06-05 NOTE — Telephone Encounter (Signed)
Rx refill sent to pharmacy. 

## 2011-06-09 ENCOUNTER — Other Ambulatory Visit: Payer: Self-pay | Admitting: Internal Medicine

## 2011-06-09 DIAGNOSIS — D649 Anemia, unspecified: Secondary | ICD-10-CM

## 2011-06-10 ENCOUNTER — Other Ambulatory Visit: Payer: Self-pay | Admitting: Internal Medicine

## 2011-06-12 NOTE — Telephone Encounter (Signed)
Rx refill sent to phrmacy.

## 2011-06-29 ENCOUNTER — Encounter: Payer: Self-pay | Admitting: Internal Medicine

## 2011-06-29 ENCOUNTER — Ambulatory Visit (INDEPENDENT_AMBULATORY_CARE_PROVIDER_SITE_OTHER): Payer: Medicare Other | Admitting: Internal Medicine

## 2011-06-29 ENCOUNTER — Other Ambulatory Visit (INDEPENDENT_AMBULATORY_CARE_PROVIDER_SITE_OTHER): Payer: Medicare Other

## 2011-06-29 VITALS — BP 134/62 | HR 80 | Ht 73.0 in | Wt 338.0 lb

## 2011-06-29 DIAGNOSIS — D509 Iron deficiency anemia, unspecified: Secondary | ICD-10-CM

## 2011-06-29 DIAGNOSIS — Z800 Family history of malignant neoplasm of digestive organs: Secondary | ICD-10-CM | POA: Insufficient documentation

## 2011-06-29 DIAGNOSIS — Z860100 Personal history of colon polyps, unspecified: Secondary | ICD-10-CM | POA: Insufficient documentation

## 2011-06-29 LAB — CBC WITH DIFFERENTIAL/PLATELET
Eosinophils Relative: 4.1 % (ref 0.0–5.0)
HCT: 37.2 % — ABNORMAL LOW (ref 39.0–52.0)
Hemoglobin: 11.8 g/dL — ABNORMAL LOW (ref 13.0–17.0)
Lymphocytes Relative: 21.1 % (ref 12.0–46.0)
Lymphs Abs: 1.4 10*3/uL (ref 0.7–4.0)
Monocytes Relative: 8.1 % (ref 3.0–12.0)
Neutro Abs: 4.4 10*3/uL (ref 1.4–7.7)
WBC: 6.8 10*3/uL (ref 4.5–10.5)

## 2011-06-29 NOTE — Patient Instructions (Addendum)
You will have a complete blood count and check stool cards for blood in the stool today.  We will contact you with the results and plans. Please go to the basement upon leaving today to have your labs done.

## 2011-06-29 NOTE — Assessment & Plan Note (Signed)
This was an elderly brother so risk profile may not be increased.

## 2011-06-29 NOTE — Progress Notes (Signed)
[facility]  DOMONICK SITTNER [id number] [birth year] 70 y.o.   HPI: Mr. Beavers presents for evaluation of iron deficiency anemia which is new since 2010. Was discovered at routine followup hemoglobin in the 8 range an MCV of 74 his ferritin is less than 10. He denies any rectal bleeding or blood in the stool. He says he was given Hemoccult cards to check but they were negative. I cannot see those in the chart. He was started on ferrous sulfate a month ago. Taking it twice a day and stool somewhat dark on that. Prior to this over the past couple of years he sounds like he was not eating much iron. He is not a blood donor.  Surprisingly he has no other symptoms including no chest pain or dyspnea. His weight has been relatively stable overall, he does remain obese.     PE: Filed Vitals:   06/29/11 0832  BP: 134/62  Pulse: 80   Obese well-developed no acute distress Lungs clear Heart S1-S2 with a 1-2/6 systolic murmur heard best at the right upper sternal border Abdomen is obese soft and nontender bowel sounds present Extremities free of edema He is awake alert and oriented x3    Assessment/Plan:

## 2011-06-29 NOTE — Assessment & Plan Note (Signed)
Etiology is not clear. Blood loss is possible. Will repeat Hemoccults. Apparently the first were negative but I think is worth repeating these. It may be nutritional, it sounds like he was not eating much iron in his diet prior to this. Will reassess with CBC today and even if Hemoccult-negative he may end up requiring repeat endoscopic examination. I explained this to him today.

## 2011-06-29 NOTE — Assessment & Plan Note (Signed)
In 2010 he had a 3 mm adenoma removed.

## 2011-07-15 ENCOUNTER — Other Ambulatory Visit: Payer: Self-pay | Admitting: Internal Medicine

## 2011-07-17 NOTE — Telephone Encounter (Signed)
Rx refill sent to pharmacy. 

## 2011-07-27 ENCOUNTER — Other Ambulatory Visit: Payer: Self-pay | Admitting: Internal Medicine

## 2011-07-27 ENCOUNTER — Other Ambulatory Visit: Payer: Medicare Other

## 2011-07-27 DIAGNOSIS — Z1211 Encounter for screening for malignant neoplasm of colon: Secondary | ICD-10-CM

## 2011-07-27 LAB — HEMOCCULT SLIDES (X 3 CARDS)
Fecal Occult Blood: NEGATIVE
OCCULT 2: NEGATIVE
OCCULT 3: NEGATIVE
OCCULT 4: NEGATIVE
OCCULT 5: NEGATIVE

## 2011-08-04 ENCOUNTER — Ambulatory Visit (INDEPENDENT_AMBULATORY_CARE_PROVIDER_SITE_OTHER): Payer: Medicare Other | Admitting: Internal Medicine

## 2011-08-04 ENCOUNTER — Encounter: Payer: Self-pay | Admitting: Internal Medicine

## 2011-08-04 VITALS — BP 124/60 | HR 78 | Temp 97.8°F | Resp 20 | Wt 329.0 lb

## 2011-08-04 DIAGNOSIS — R509 Fever, unspecified: Secondary | ICD-10-CM

## 2011-08-04 LAB — POCT RAPID INFLUENZA A&B: Influenza A, POC: NEGATIVE

## 2011-08-04 MED ORDER — PROMETHAZINE HCL 25 MG PO TABS: 25.0000 mg | ORAL_TABLET | Freq: Four times a day (QID) | ORAL | Status: AC | PRN

## 2011-08-05 ENCOUNTER — Other Ambulatory Visit: Payer: Self-pay | Admitting: Internal Medicine

## 2011-08-05 DIAGNOSIS — R509 Fever, unspecified: Secondary | ICD-10-CM | POA: Insufficient documentation

## 2011-08-05 NOTE — Assessment & Plan Note (Signed)
Influenza swab obtained and negative. Provide phenergan prn. Push po fluids. Hold lasix for 2-3 days. Suspect current viral illness. Schedule close follow up

## 2011-08-05 NOTE — Progress Notes (Signed)
Subjective:    Patient ID: David Parsons, male    DOB: 03-22-1941, 70 y.o.   MRN: [id number]  HPI Pt presents to clinic for evaluation of diarrhea. Notes 3d h/o chills, cough productive for yellow sputum and decreased appetite.  +sick exposure. Yesterday develop loose watery diarrhea without blood and nausea without emesis. Denies fever. Not taking medication for diarrhea. No alleviating or exacerbating factors. No other complaints.  Past Medical History  Diagnosis Date  . Hypertension   . Aortic insufficiency      02/16/09 Chest CT:  The aortic root has a diameter measuring 4.6 cm, image 64 of the coronal series.  The ascending    thoracic aorta has a diameter of 3.8 cm, image 65 of the coronal series.  At the level of the aortic arch    the thoracic aorta has a maximum diameter of 3.2 cm.  The descending thoracic aorta has a maximum           diameter of 3.2 cm.  There is no evidence for aortic disse        . Obesity   . GERD (gastroesophageal reflux disease)   . Hypokalemia   . Helicobacter pylori gastritis     (Tx Pylera 4/10)  . Cardiomyopathy      01/2009 - Cath - nonobs. dzs.  EF 35%.  . Gallstones     s/p cholecystectomy    . Sleep apnea     occasionally wears CPAP at night  . CAD (coronary artery disease)   . Aortic root enlargement     The aortic root has a diameter measuring 4.6 cm, image 64 of the coronal series.  The ascending  thoracic aorta has a diameter of 3.8 cm, image 65 of the coronal series.  At the level of the aortic arch   the thoracic aorta has a maximum diameter of 3.2 cm.  The descending thoracic aorta has a maximum   diameter of 3.2 cm.  There is no evidence for aortic dissection.       . Iron deficiency anemia   . CHF (congestive heart failure)    Past Surgical History  Procedure Date  . Cholecystectomy   . Adrenalectomy   . Colonoscopy w/ biopsies and polypectomy 11/04/2008    colon polyps, diverticulosis   . Upper gastrointestinal endoscopy 11/04/2008     w/bx, H pylori gastritis    reports that he has never smoked. He has never used smokeless tobacco. He reports that he does not drink alcohol or use illicit drugs. family history includes Breast cancer in his sister; Colon cancer (age of onset:70) in his brother; Diabetes in his sister; Hypertension in his mother; Kidney failure in his sister; Prostate cancer in his brother; Stomach cancer in his sister; and Stroke in his father. No Known Allergies   Review of Systems see hpi     Objective:   Physical Exam  Nursing note and vitals reviewed. Constitutional: He appears well-developed and well-nourished. He has a sickly appearance. No distress.  HENT:  Head: Normocephalic and atraumatic.  Right Ear: External ear normal.  Left Ear: External ear normal.  Nose: Nose normal.  Mouth/Throat: Oropharynx is clear and moist. No oropharyngeal exudate.  Eyes: Conjunctivae are normal. Right eye exhibits no discharge. Left eye exhibits no discharge. No scleral icterus.  Neck: Neck supple.  Cardiovascular: Normal rate, regular rhythm and normal heart sounds.   Pulmonary/Chest: Effort normal and breath sounds normal.  Abdominal: Soft. Bowel sounds are normal.  He exhibits no distension. There is no tenderness.  Neurological: He is alert.  Skin: Skin is warm and dry. He is not diaphoretic.  Psychiatric: He has a normal mood and affect.          Assessment & Plan:

## 2011-08-07 ENCOUNTER — Other Ambulatory Visit: Payer: Self-pay | Admitting: Internal Medicine

## 2011-08-07 NOTE — Telephone Encounter (Signed)
Rx refill sent to pharmacy. 

## 2011-08-08 ENCOUNTER — Ambulatory Visit: Payer: Medicare Other | Admitting: Internal Medicine

## 2011-08-08 NOTE — Telephone Encounter (Signed)
Rx refill sent to pharmacy. 

## 2011-08-23 ENCOUNTER — Telehealth: Payer: Self-pay | Admitting: *Deleted

## 2011-08-23 NOTE — Telephone Encounter (Signed)
Received message from pt requesting a return call re: medication question.  States he received a letter from his insurance stating that he cannot get Micardis HCT any longer and he thinks it is because he is taking potassium. Advised pt's wife to call us back once they have the letter with them or bring the letter by the office for clarification.

## 2011-08-24 NOTE — Telephone Encounter (Signed)
Dr Rodena Medin is there an alternative that could be prescribed in place of Micardis. The insurance may no longer cover it.

## 2011-08-24 NOTE — Telephone Encounter (Signed)
Pt brought letter to office. Ins. Would not cover Micardis HCT because of possible interaction when taken with Klor Con; could cause cardiac arrhythmias or cardiac arrest. Please advise if there is another alternative to Micardis HCT.

## 2011-08-24 NOTE — Telephone Encounter (Signed)
Call placed to patient at [phone], he was informed that I spoke with pharmacist Barbara Cower), he stated the Rx was transferred from [facility] to [facility] and is available for patient pick up. No further action required.

## 2011-08-24 NOTE — Telephone Encounter (Signed)
Spoke with pt's wife, she states that pt will be bringing the letter to our office this morning.

## 2011-08-28 ENCOUNTER — Encounter: Payer: Medicare Other | Admitting: Internal Medicine

## 2011-09-11 ENCOUNTER — Ambulatory Visit: Payer: Medicare Other | Admitting: Internal Medicine

## 2011-09-12 ENCOUNTER — Ambulatory Visit (AMBULATORY_SURGERY_CENTER): Payer: Medicare Other | Admitting: *Deleted

## 2011-09-12 VITALS — Ht 73.0 in | Wt 338.0 lb

## 2011-09-12 DIAGNOSIS — Z1211 Encounter for screening for malignant neoplasm of colon: Secondary | ICD-10-CM

## 2011-09-12 DIAGNOSIS — D509 Iron deficiency anemia, unspecified: Secondary | ICD-10-CM

## 2011-09-12 MED ORDER — PEG-KCL-NACL-NASULF-NA ASC-C 100 G PO SOLR: ORAL | Status: DC

## 2011-09-19 ENCOUNTER — Telehealth: Payer: Self-pay | Admitting: *Deleted

## 2011-09-19 NOTE — Telephone Encounter (Signed)
Patient called and left voice message requesting clarification on his potassium. He stated that he was advised to increase his dose to 3 tablets two times a day, however the refill that he has states that he is suppose to be taking the medication twice day. Please advise.

## 2011-09-19 NOTE — Telephone Encounter (Signed)
Chart indicates 1 bid. Is there documentation of 3 bid? What he was taking in sept is appropriate as his k was nl then

## 2011-09-19 NOTE — Telephone Encounter (Signed)
Patient called asking about his meds.  Told him i would let Agustin Cree know he has called again

## 2011-09-20 NOTE — Telephone Encounter (Signed)
Call placed to patient at [phone], no answer. A detailed voice message was left informing patient K+ dose noted for twice a day in chart; there was nothing noted stating to take the potassium three time per day. He was advised to call back if any questions.

## 2011-09-21 ENCOUNTER — Encounter: Payer: Medicare Other | Admitting: Internal Medicine

## 2011-09-25 ENCOUNTER — Encounter: Payer: Self-pay | Admitting: Internal Medicine

## 2011-09-25 ENCOUNTER — Ambulatory Visit (AMBULATORY_SURGERY_CENTER): Payer: Medicare Other | Admitting: Internal Medicine

## 2011-09-25 ENCOUNTER — Encounter: Payer: Medicare Other | Admitting: Internal Medicine

## 2011-09-25 VITALS — BP 174/75 | HR 71 | Temp 98.7°F | Resp 18 | Ht 73.0 in | Wt 338.0 lb

## 2011-09-25 DIAGNOSIS — K2970 Gastritis, unspecified, without bleeding: Secondary | ICD-10-CM

## 2011-09-25 DIAGNOSIS — I509 Heart failure, unspecified: Secondary | ICD-10-CM | POA: Diagnosis not present

## 2011-09-25 DIAGNOSIS — E119 Type 2 diabetes mellitus without complications: Secondary | ICD-10-CM | POA: Diagnosis not present

## 2011-09-25 DIAGNOSIS — D509 Iron deficiency anemia, unspecified: Secondary | ICD-10-CM | POA: Diagnosis not present

## 2011-09-25 DIAGNOSIS — Z1211 Encounter for screening for malignant neoplasm of colon: Secondary | ICD-10-CM

## 2011-09-25 DIAGNOSIS — K5730 Diverticulosis of large intestine without perforation or abscess without bleeding: Secondary | ICD-10-CM | POA: Diagnosis not present

## 2011-09-25 DIAGNOSIS — K2990 Gastroduodenitis, unspecified, without bleeding: Secondary | ICD-10-CM

## 2011-09-25 DIAGNOSIS — I10 Essential (primary) hypertension: Secondary | ICD-10-CM | POA: Diagnosis not present

## 2011-09-25 DIAGNOSIS — D649 Anemia, unspecified: Secondary | ICD-10-CM | POA: Diagnosis not present

## 2011-09-25 HISTORY — PX: COLONOSCOPY: SHX174

## 2011-09-25 HISTORY — PX: UPPER GASTROINTESTINAL ENDOSCOPY: SHX188

## 2011-09-25 MED ORDER — SODIUM CHLORIDE 0.9 % IV SOLN: 500.0000 mL | INTRAVENOUS | Status: DC

## 2011-09-25 MED ORDER — OMEPRAZOLE 20 MG PO CPDR: 20.0000 mg | DELAYED_RELEASE_CAPSULE | Freq: Every day | ORAL | Status: DC

## 2011-09-25 NOTE — Patient Instructions (Addendum)
The stomach was irritated - called gastritis. Biopsies were taken. Could be from aspirin or infection. I will let you know. You need to start omeprazole 20 mg daily. Prescription sent to the pharmacy. This will protect the stomach from ulcers. The colonoscopy showed diverticulosis like before. No bleeding areas. I think you probably do not get enough iron from food but could be leaking small amounts of blood from the stomach. Please restart the ferrous sulfate and see Dr. Rodena Medin to reassess the anemia. You can see me as needed. Iva Boop, MD, [facility]  Please follow all discharge instructions today. Pick up new medication from your pharmacy. Resume current medications. Call us with any questions or concerns. Thank you!!

## 2011-09-25 NOTE — Op Note (Signed)
[facility] [address] [place], [place]  [address]  ENDOSCOPY PROCEDURE REPORT  PATIENT:  David Parsons, David Parsons  MR#:  [id number] BIRTHDATE:  1940/11/13, 70 yrs. old  GENDER:  male  ENDOSCOPIST:  Iva Boop, MD, [facility]  PROCEDURE DATE:  09/25/2011 PROCEDURE:  EGD with biopsy, [address] ASA CLASS:  Class III INDICATIONS:  iron deficiency anemia  MEDICATIONS:   MAC sedation, administered by CRNA, propofol (Diprivan) 150 mg IV TOPICAL ANESTHETIC:  none  DESCRIPTION OF PROCEDURE:   After the risks benefits and alternatives of the procedure were thoroughly explained, informed consent was obtained.  The LB GIF-H180 [id number] endoscope was introduced through the mouth and advanced to the second portion of the duodenum, without limitations.  The instrument was slowly withdrawn as the mucosa was fully examined. <<PROCEDUREIMAGES>>  Moderate gastritis was found in the antrum. Multiple read areas and superficial erosions. Multiple biopsies were obtained and sent to pathology.  A hiatal hernia was found. It was 2 cm in size. Otherwise the examination was normal.    Retroflexed views revealed a hiatal hernia.    The scope was then withdrawn from the patient and the procedure completed.  COMPLICATIONS:  None  ENDOSCOPIC IMPRESSION: 1) Moderate gastritis in the antrum 2) 2 cm hiatal hernia 3) Otherwise normal examination RECOMMENDATIONS: 1) Await biopsy results 2) start omeprazole 20 mg daily to treat gastritis (? ASA related). would most likely continue long term as protective. He is at higher risk of ulcer disease on ASA and with CHF. 3) see colonoscopy report from today also  Iva Boop, MD, [facility]  CC:  Charlynn Court MD and The Patient  n. eSIGNED:   Iva Boop at 09/25/2011 03:10 PM  Josie Dixon, [phone]

## 2011-09-25 NOTE — Op Note (Signed)
[facility] [address] [place], [place]  [address]  COLONOSCOPY PROCEDURE REPORT  PATIENT:  David Parsons, David Parsons  MR#:  [id number] BIRTHDATE:  1940/10/15, 70 yrs. old  GENDER:  male ENDOSCOPIST:  Iva Boop, MD, [facility]  PROCEDURE DATE:  09/25/2011 PROCEDURE:  Colonoscopy [address] ASA CLASS:  Class III INDICATIONS:  Iron deficiency anemia MEDICATIONS:   MAC sedation, administered by CRNA, propofol (Diprivan) 150 mg IV  DESCRIPTION OF PROCEDURE:   After the risks benefits and alternatives of the procedure were thoroughly explained, informed consent was obtained.  Digital rectal exam was performed and revealed no abnormalities.   The LB CF-H180AL [id number] endoscope was introduced through the anus and advanced to the cecum, which was identified by both the appendix and ileocecal valve, without limitations.  The quality of the prep was excellent, using MoviPrep.  The instrument was then slowly withdrawn as the colon was fully examined. <<PROCEDUREIMAGES>>  FINDINGS:  Severe diverticulosis was found in the left colon. This was otherwise a normal examination of the colon.   Retroflexed views in the rectum revealed no abnormalities.    The time to cecum = 7:07 minutes. The scope was then withdrawn in 7:19 minutes from the cecum and the procedure completed. COMPLICATIONS:  None ENDOSCOPIC IMPRESSION: 1) Severe diverticulosis in the left colon - otherwise normal RECOMMENDATIONS: See EGD note. ? nutritional vs. leaking from gastrits - supplement iron and take PPI. Will see if gastritis biopsies show anything. would reserve further evaluation for anemia not responsive to iron. REPEAT EXAM:  In for not necessary.  Iva Boop, MD, [facility]  CC:  The Patient and Charlynn Court MD  n. Rosalie Doctor:   Iva Boop at 09/25/2011 03:20 PM  Josie Dixon, [phone]

## 2011-09-25 NOTE — Progress Notes (Signed)
1 unsuccessful attempt to R hand by Nicolasa Ducking, RN.  2 unsuccessful attempts to R wrist and L hand by N. Myrene Bougher, Charity fundraiser.

## 2011-09-25 NOTE — Progress Notes (Signed)
Patient did not experience any of the following events: a burn prior to discharge; a fall within the facility; wrong site/side/patient/procedure/implant event; or a hospital transfer or hospital admission upon discharge from the facility. (G8907) Patient did not have preoperative order for IV antibiotic SSI prophylaxis. (G8918)  

## 2011-09-26 ENCOUNTER — Telehealth: Payer: Self-pay | Admitting: *Deleted

## 2011-09-26 NOTE — Telephone Encounter (Signed)
No answer, message left

## 2011-09-28 ENCOUNTER — Other Ambulatory Visit: Payer: Self-pay | Admitting: *Deleted

## 2011-09-28 MED ORDER — HYDRALAZINE HCL 50 MG PO TABS: 150.0000 mg | ORAL_TABLET | Freq: Every day | ORAL | Status: DC

## 2011-10-02 ENCOUNTER — Encounter: Payer: Self-pay | Admitting: Internal Medicine

## 2011-10-02 NOTE — Progress Notes (Signed)
Quick Note:  Chronic gastritis changes Omeprazole was started ______

## 2011-10-02 NOTE — Progress Notes (Signed)
Quick Note:  No EGD recall needed ______

## 2011-10-13 ENCOUNTER — Other Ambulatory Visit: Payer: Self-pay | Admitting: Internal Medicine

## 2011-10-13 NOTE — Telephone Encounter (Signed)
Rx refill sent to pharmacy. 

## 2011-10-19 ENCOUNTER — Telehealth: Payer: Self-pay | Admitting: *Deleted

## 2011-10-19 ENCOUNTER — Encounter: Payer: Self-pay | Admitting: Cardiovascular Disease

## 2011-10-19 ENCOUNTER — Ambulatory Visit (INDEPENDENT_AMBULATORY_CARE_PROVIDER_SITE_OTHER): Payer: Medicare Other | Admitting: Cardiovascular Disease

## 2011-10-19 VITALS — BP 134/52 | HR 72 | Ht 73.0 in | Wt 335.0 lb

## 2011-10-19 DIAGNOSIS — I359 Nonrheumatic aortic valve disorder, unspecified: Secondary | ICD-10-CM

## 2011-10-19 DIAGNOSIS — I428 Other cardiomyopathies: Secondary | ICD-10-CM

## 2011-10-19 DIAGNOSIS — I2510 Atherosclerotic heart disease of native coronary artery without angina pectoris: Secondary | ICD-10-CM | POA: Diagnosis not present

## 2011-10-19 DIAGNOSIS — I10 Essential (primary) hypertension: Secondary | ICD-10-CM

## 2011-10-19 DIAGNOSIS — I712 Thoracic aortic aneurysm, without rupture: Secondary | ICD-10-CM

## 2011-10-19 LAB — BASIC METABOLIC PANEL
BUN: 13 mg/dL (ref 6–23)
CO2: 33 mEq/L — ABNORMAL HIGH (ref 19–32)
Glucose, Bld: 99 mg/dL (ref 70–99)
Potassium: 2.8 mEq/L — CL (ref 3.5–5.1)
Sodium: 140 mEq/L (ref 135–145)

## 2011-10-19 MED ORDER — POTASSIUM CHLORIDE CRYS ER 20 MEQ PO TBCR: EXTENDED_RELEASE_TABLET | ORAL | Status: DC

## 2011-10-19 NOTE — Patient Instructions (Signed)
Your physician wants you to follow-up in:  12 months. You will receive a reminder letter in the mail two months in advance. If you don't receive a letter, please call our office to schedule the follow-up appointment.  Your physician has requested that you have an echocardiogram. Echocardiography is a painless test that uses sound waves to create images of your heart. It provides your doctor with information about the size and shape of your heart and how well your heart's chambers and valves are working. This procedure takes approximately one hour. There are no restrictions for this procedure.   Non-Cardiac CT Angiography (CTA), is a special type of CT scan that uses a computer to produce multi-dimensional views of major blood vessels throughout the body. In CT angiography, a contrast material is injected through an IV to help visualize the blood vessels

## 2011-10-19 NOTE — Progress Notes (Signed)
History of Present Illness: 71 yo AAM with history of NICM, dilated aortic root, moderate AI, non-obstrucitve CAD, morbid obesity, obstructive sleep apnea, HTN and GERD who was referred in May 2010 for SOB, abdominal swelling and LE edema. He has since been diagnosed with a non-ischemic CM, moderate AI, non-obstructive CAD and a dilated aortic root. He has undergone a right and left heart cath with non-obstructive coronary artery disease. A CT angiogram was performed in June 2010 to look closely at his aortic root and showed moderate dilation of the aortic root (root 4.6 cm, ascending aorta 3.8cm). It is felt that his aortic insufficiency is likely related to the aortic root dilation. His LV is mildly dilated but LV function is now normal. Echo in the spring of 2011 showed enlarging aortic root. I planned on performing an MRI to look at his valve, degree of AI and size of root but he was too large for the scanner. Repeat CT angiogram chest 5/05/11showed enlarged aortic root at 5.5cm with ascending aorta 4.0cm. Because of this, I referred him to Dr. Cornelius Moras with [facility]. Dr. Cornelius Moras recommended waiting for surgery until his AI worsened or his root enlarged more. CT chest march 2012 stable. He was last seen by Dr. Cornelius Moras in June 2012 with plans for one year follow up.   He is here today for follow up. He has had no chest pain, SOB, DOE, palpitations, near syncope or syncope. His energy level has been good. He has been very active. He has been taking all of his medications. No complaints.    Primary Care Physician: Charlynn Court  Last Lipid Profile: September 2012, well controlled.   Past Medical History  Diagnosis Date  . Hypertension   . Aortic insufficiency      02/16/09 Chest CT:  The aortic root has a diameter measuring 4.6 cm, image 64 of the coronal series.  The ascending    thoracic aorta has a diameter of 3.8 cm, image 65 of the coronal series.  At the level of the aortic arch    the thoracic aorta has a  maximum diameter of 3.2 cm.  The descending thoracic aorta has a maximum           diameter of 3.2 cm.  There is no evidence for aortic disse        . Obesity   . GERD (gastroesophageal reflux disease)   . Hypokalemia   . Helicobacter pylori gastritis     (Tx Pylera 4/10)  . Cardiomyopathy      01/2009 - Cath - nonobs. dzs.  EF 35%.  . Gallstones     s/p cholecystectomy    . Sleep apnea     occasionally wears CPAP at night  . CAD (coronary artery disease)   . Aortic root enlargement     The aortic root has a diameter measuring 4.6 cm, image 64 of the coronal series.  The ascending  thoracic aorta has a diameter of 3.8 cm, image 65 of the coronal series.  At the level of the aortic arch   the thoracic aorta has a maximum diameter of 3.2 cm.  The descending thoracic aorta has a maximum   diameter of 3.2 cm.  There is no evidence for aortic dissection.       . Iron deficiency anemia   . CHF (congestive heart failure)     Past Surgical History  Procedure Date  . Cholecystectomy   . Adrenalectomy   . Colonoscopy w/  biopsies and polypectomy 11/04/2008    colon polyps, diverticulosis   . Upper gastrointestinal endoscopy 11/04/2008    w/bx, H pylori gastritis  . Upper gastrointestinal endoscopy 09/25/2011  . Colonoscopy 09/25/2011    Current Outpatient Prescriptions  Medication Sig Dispense Refill  . amLODipine (NORVASC) 10 MG tablet Take 1 tablet (10 mg total) by mouth daily.  30 tablet  8  . aspirin 81 MG tablet Take 81 mg by mouth daily.        . carvedilol (COREG) 25 MG tablet take 1 tablet by mouth twice a day  60 tablet  3  . ferrous sulfate 325 (65 FE) MG tablet take 1 tablet by mouth twice a day  30 tablet  3  . furosemide (LASIX) 40 MG tablet take 1 and 1/2 tablets by mouth every morning  45 tablet  3  . hydrALAZINE (APRESOLINE) 50 MG tablet Take 3 tablets (150 mg total) by mouth daily. TAKE 2 TABLETS IN THE AM, 1 TABLET IN THE PM  90 tablet  5  . KLOR-CON M20 20 MEQ tablet take 1  tablet by mouth twice a day  180 tablet  1  . MICARDIS HCT 80-25 MG per tablet take 1 tablet by mouth once daily  30 tablet  6  . Multiple Vitamin (MULTIVITAMIN) capsule Take 1 capsule by mouth daily.        Marland Kitchen omeprazole (PRILOSEC) 20 MG capsule Take 1 capsule (20 mg total) by mouth daily. 30 minutes before breakfast  30 capsule  11    No Known Allergies  History   Social History  . Marital Status: Married    Spouse Name: Steward Drone    Number of Children: 0  . Years of Education: N/A   Occupational History  .      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 Smoking status: Never Smoker   . Smokeless tobacco: Never Used  . Alcohol Use: No  . Drug Use: No  . Sexually Active: Not on file   Other Topics Concern  . Not on file   Social History Narrative   The patient is married, lives with his wife in  [place].  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  He  lives a very sedentary lifestyle.  He is a nonsmoker.  He denies alcohol consumption.     Family History  Problem Relation Age of Onset  . Breast cancer Sister   . Colon cancer Brother 70    at least 70  . Prostate cancer Brother   . Diabetes Sister     5 siblings  . Stomach cancer Sister   . Hypertension Mother   . Kidney failure Sister   . Colon cancer Sister   . Breast cancer Sister   . Stroke Father     Review of Systems:  As stated in the HPI and otherwise negative.   BP 134/52  Pulse 72  Ht 6\' 1"  (1.854 m)  Wt 335 lb (151.955 kg)  BMI 44.20 kg/m2  Physical Examination: General: Well developed, well nourished, NAD HEENT: OP clear, mucus membranes moist SKIN: warm, dry. No rashes. Neuro: No focal deficits Musculoskeletal: Muscle strength 5/5 all ext Psychiatric: Mood and affect normal Neck: No JVD, no carotid bruits, no thyromegaly, no lymphadenopathy. Lungs:Clear bilaterally, no wheezes, rhonci, crackles Cardiovascular: Regular rate and rhythm. Soft diastolic  murmur. No gallops or rubs. Abdomen:Soft. Bowel sounds present. Non-tender.  Extremities: No lower  extremity edema. Pulses are 2 + in the bilateral DP/PT.

## 2011-10-19 NOTE — Assessment & Plan Note (Signed)
Will repeat CTA chest. He will f/u with Dr. Cornelius Moras in 4 months.

## 2011-10-19 NOTE — Assessment & Plan Note (Signed)
Will repeat echo to reassess.

## 2011-10-19 NOTE — Assessment & Plan Note (Signed)
Stable. Repeat echo.

## 2011-10-19 NOTE — Assessment & Plan Note (Signed)
Stable. No changes. Lipids and BP at goal.

## 2011-10-19 NOTE — Telephone Encounter (Signed)
Received call from lab --Potassium 2.8.  Pt saw Dr. Clifton James today and he is taking potassium 20 meq twice daily.  Reviewed with Dr. Elease Hashimoto and instructions given.  I called pt who reports usual home dose of potassium is 60 meq twice daily and when he picked potassium up at pharmacy a few weeks ago instructions were for 20 meq twice daily. He states there was no reason for change in dosage.  I spoke with Dr. Melburn Popper again and pt to take 40 meq potassium now and 40 meq again this evening. Tomorrow he should resume previous usual dose of 60 meq twice daily and come in for BMP next Thursday.  Pt verbalizes understanding of all instructions.  Will send prescription to [facility] on [address] per his request.

## 2011-10-19 NOTE — Assessment & Plan Note (Signed)
BP well controlled.

## 2011-10-23 ENCOUNTER — Telehealth: Payer: Self-pay | Admitting: Internal Medicine

## 2011-10-23 ENCOUNTER — Encounter: Payer: Self-pay | Admitting: Internal Medicine

## 2011-10-23 ENCOUNTER — Ambulatory Visit (INDEPENDENT_AMBULATORY_CARE_PROVIDER_SITE_OTHER): Payer: Medicare Other | Admitting: Internal Medicine

## 2011-10-23 VITALS — BP 132/70 | HR 74 | Temp 97.7°F | Resp 18 | Ht 74.0 in | Wt 338.0 lb

## 2011-10-23 DIAGNOSIS — I10 Essential (primary) hypertension: Secondary | ICD-10-CM

## 2011-10-23 DIAGNOSIS — R7309 Other abnormal glucose: Secondary | ICD-10-CM | POA: Diagnosis not present

## 2011-10-23 DIAGNOSIS — L608 Other nail disorders: Secondary | ICD-10-CM

## 2011-10-23 DIAGNOSIS — Z79899 Other long term (current) drug therapy: Secondary | ICD-10-CM

## 2011-10-23 DIAGNOSIS — L603 Nail dystrophy: Secondary | ICD-10-CM | POA: Insufficient documentation

## 2011-10-23 DIAGNOSIS — D509 Iron deficiency anemia, unspecified: Secondary | ICD-10-CM

## 2011-10-23 DIAGNOSIS — E785 Hyperlipidemia, unspecified: Secondary | ICD-10-CM

## 2011-10-23 NOTE — Assessment & Plan Note (Signed)
Normotensive and stable. Continue current regimen. Monitor bp as outpt and followup in clinic as scheduled.  

## 2011-10-23 NOTE — Assessment & Plan Note (Signed)
Podiatry referral

## 2011-10-23 NOTE — Assessment & Plan Note (Signed)
Stable. Recent nl glucose. Continue to monitor. Wt loss recommended.

## 2011-10-23 NOTE — Progress Notes (Signed)
Subjective:    Patient ID: David Parsons, male    DOB: Apr 09, 1941, 71 y.o.   MRN: [id number]  HPI Pt presents to clinic for followup of multiple medical problems. Has dystrophic toenails he finds difficult to trim at home. Cardiology treating hypokalemia of 2.8 with increase of kdur and repeat chem7 in three days. Recent glucose reviewed as nl. Interested in possible prescription for zostavax. bp reviewed under average control.   Past Medical History  Diagnosis Date  . Hypertension   . Aortic insufficiency      02/16/09 Chest CT:  The aortic root has a diameter measuring 4.6 cm, image 64 of the coronal series.  The ascending    thoracic aorta has a diameter of 3.8 cm, image 65 of the coronal series.  At the level of the aortic arch    the thoracic aorta has a maximum diameter of 3.2 cm.  The descending thoracic aorta has a maximum           diameter of 3.2 cm.  There is no evidence for aortic disse        . Obesity   . GERD (gastroesophageal reflux disease)   . Hypokalemia   . Helicobacter pylori gastritis     (Tx Pylera 4/10)  . Cardiomyopathy      01/2009 - Cath - nonobs. dzs.  EF 35%.  . Gallstones     s/p cholecystectomy    . Sleep apnea     occasionally wears CPAP at night  . CAD (coronary artery disease)   . Aortic root enlargement     The aortic root has a diameter measuring 4.6 cm, image 64 of the coronal series.  The ascending  thoracic aorta has a diameter of 3.8 cm, image 65 of the coronal series.  At the level of the aortic arch   the thoracic aorta has a maximum diameter of 3.2 cm.  The descending thoracic aorta has a maximum   diameter of 3.2 cm.  There is no evidence for aortic dissection.       . Iron deficiency anemia   . CHF (congestive heart failure)    Past Surgical History  Procedure Date  . Cholecystectomy   . Adrenalectomy   . Colonoscopy w/ biopsies and polypectomy 11/04/2008    colon polyps, diverticulosis   . Upper gastrointestinal endoscopy 11/04/2008   w/bx, H pylori gastritis  . Upper gastrointestinal endoscopy 09/25/2011  . Colonoscopy 09/25/2011    reports that he has never smoked. He has never used smokeless tobacco. He reports that he does not drink alcohol or use illicit drugs. family history includes Breast cancer in his sisters; Colon cancer in his sister; Colon cancer (age of onset:70) in his brother; Diabetes in his sister; Hypertension in his mother; Kidney failure in his sister; Prostate cancer in his brother; Stomach cancer in his sister; and Stroke in his father. No Known Allergies   Review of Systems see hpi     Objective:   Physical Exam  Physical Exam  Nursing note and vitals reviewed. Constitutional: Appears well-developed and well-nourished. No distress.  HENT:  Head: Normocephalic and atraumatic.  Right Ear: External ear normal.  Left Ear: External ear normal.  Eyes: Conjunctivae are normal. No scleral icterus.  Neck: Neck supple. Carotid bruit is not present.  Cardiovascular: Normal rate, regular rhythm and normal heart sounds.  Exam reveals no gallop and no friction rub.   No murmur heard. Pulmonary/Chest: Effort normal and breath sounds normal. No  respiratory distress. He has no wheezes. no rales.  Lymphadenopathy:    He has no cervical adenopathy.  Neurological:Alert.  Skin: Skin is warm and dry. Not diaphoretic.  Psychiatric: Has a normal mood and affect.  Ext: multiple dystrophic toenails noted on bilateral feet.      Assessment & Plan:

## 2011-10-23 NOTE — Patient Instructions (Signed)
Please schedule fasting labs prior to your next appointment Cbc-anemia, chem7-v58.69, a1c- hyperglycemia, lipid/lft 272.4

## 2011-10-23 NOTE — Telephone Encounter (Signed)
Lab orders entered for June 2013. 

## 2011-10-26 ENCOUNTER — Other Ambulatory Visit: Payer: Medicare Other

## 2011-10-30 ENCOUNTER — Other Ambulatory Visit (HOSPITAL_COMMUNITY): Payer: Medicare Other

## 2011-10-31 ENCOUNTER — Ambulatory Visit (HOSPITAL_COMMUNITY): Payer: Medicare Other | Attending: Cardiovascular Disease

## 2011-10-31 ENCOUNTER — Telehealth: Payer: Self-pay | Admitting: *Deleted

## 2011-10-31 ENCOUNTER — Ambulatory Visit
Admission: RE | Admit: 2011-10-31 | Discharge: 2011-10-31 | Disposition: A | Discharge status: Home / Self Care | Payer: Medicare Other | Source: Ambulatory Visit | Attending: Cardiovascular Disease | Admitting: Cardiovascular Disease

## 2011-10-31 ENCOUNTER — Other Ambulatory Visit (HOSPITAL_COMMUNITY): Payer: Self-pay | Admitting: Cardiology

## 2011-10-31 ENCOUNTER — Other Ambulatory Visit: Payer: Self-pay

## 2011-10-31 DIAGNOSIS — I712 Thoracic aortic aneurysm, without rupture, unspecified: Secondary | ICD-10-CM | POA: Diagnosis not present

## 2011-10-31 DIAGNOSIS — I509 Heart failure, unspecified: Secondary | ICD-10-CM

## 2011-10-31 DIAGNOSIS — I10 Essential (primary) hypertension: Secondary | ICD-10-CM | POA: Diagnosis not present

## 2011-10-31 DIAGNOSIS — I359 Nonrheumatic aortic valve disorder, unspecified: Secondary | ICD-10-CM

## 2011-10-31 DIAGNOSIS — I428 Other cardiomyopathies: Secondary | ICD-10-CM

## 2011-10-31 DIAGNOSIS — I517 Cardiomegaly: Secondary | ICD-10-CM | POA: Diagnosis not present

## 2011-10-31 DIAGNOSIS — J90 Pleural effusion, not elsewhere classified: Secondary | ICD-10-CM | POA: Diagnosis not present

## 2011-10-31 MED ORDER — PERFLUTREN PROTEIN A MICROSPH IV SUSP
3.0000 mL | Freq: Once | INTRAVENOUS | Status: AC
  Administered 2011-10-31: 3 mL via INTRAVENOUS

## 2011-10-31 NOTE — Telephone Encounter (Signed)
Pt missed lab appt last week for follow up BMP. I called and spoke with him and he forgot about appt.  He will come in tomorrow for BMP.

## 2011-11-01 ENCOUNTER — Other Ambulatory Visit (INDEPENDENT_AMBULATORY_CARE_PROVIDER_SITE_OTHER): Payer: Medicare Other

## 2011-11-01 DIAGNOSIS — I359 Nonrheumatic aortic valve disorder, unspecified: Secondary | ICD-10-CM | POA: Diagnosis not present

## 2011-11-01 DIAGNOSIS — M79609 Pain in unspecified limb: Secondary | ICD-10-CM | POA: Diagnosis not present

## 2011-11-01 DIAGNOSIS — B351 Tinea unguium: Secondary | ICD-10-CM | POA: Diagnosis not present

## 2011-11-01 LAB — BASIC METABOLIC PANEL
CO2: 30 mEq/L (ref 19–32)
Calcium: 9.2 mg/dL (ref 8.4–10.5)
Creatinine, Ser: 0.9 mg/dL (ref 0.4–1.5)
Glucose, Bld: 104 mg/dL — ABNORMAL HIGH (ref 70–99)

## 2011-11-02 ENCOUNTER — Other Ambulatory Visit: Payer: Self-pay | Admitting: *Deleted

## 2011-11-02 DIAGNOSIS — I428 Other cardiomyopathies: Secondary | ICD-10-CM

## 2011-11-02 MED ORDER — POTASSIUM CHLORIDE CRYS ER 20 MEQ PO TBCR: EXTENDED_RELEASE_TABLET | ORAL | Status: DC

## 2011-11-03 ENCOUNTER — Ambulatory Visit (INDEPENDENT_AMBULATORY_CARE_PROVIDER_SITE_OTHER)
Admission: RE | Admit: 2011-11-03 | Discharge: 2011-11-03 | Disposition: A | Discharge status: Home / Self Care | Payer: Medicare Other | Source: Ambulatory Visit | Attending: Cardiovascular Disease | Admitting: Cardiovascular Disease

## 2011-11-03 ENCOUNTER — Telehealth: Payer: Self-pay | Admitting: *Deleted

## 2011-11-03 DIAGNOSIS — I712 Thoracic aortic aneurysm, without rupture: Secondary | ICD-10-CM

## 2011-11-03 MED ORDER — IOHEXOL 300 MG/ML  SOLN
100.0000 mL | Freq: Once | INTRAMUSCULAR | Status: AC | PRN
  Administered 2011-11-03: 100 mL via INTRAVENOUS

## 2011-11-03 NOTE — Telephone Encounter (Signed)
PER DR NISHAN PT NEEDS APPT WITH DR Cornelius Moras NEXT WEEK  RE  SIZE OF  DILITATION  SPOKE WITH SUSAN AT DR Orvan July OFFICE WILL CALL PT  WITH APPT.CALLED PT RE ABOVE  WILL  EXPECT  CALL FROM DR Jonelle Sports OFFICE./CY

## 2011-11-06 ENCOUNTER — Other Ambulatory Visit (INDEPENDENT_AMBULATORY_CARE_PROVIDER_SITE_OTHER): Payer: Medicare Other

## 2011-11-06 DIAGNOSIS — I428 Other cardiomyopathies: Secondary | ICD-10-CM | POA: Diagnosis not present

## 2011-11-06 LAB — BASIC METABOLIC PANEL
BUN: 12 mg/dL (ref 6–23)
Creatinine, Ser: 1 mg/dL (ref 0.4–1.5)
GFR: 96.19 mL/min (ref >60.00)

## 2011-11-07 ENCOUNTER — Encounter: Payer: Self-pay | Admitting: Thoracic Surgery (Cardiothoracic Vascular Surgery)

## 2011-11-07 ENCOUNTER — Ambulatory Visit (INDEPENDENT_AMBULATORY_CARE_PROVIDER_SITE_OTHER): Payer: Medicare Other | Admitting: Thoracic Surgery (Cardiothoracic Vascular Surgery)

## 2011-11-07 VITALS — BP 184/76 | HR 78 | Resp 20 | Ht 74.0 in | Wt 340.0 lb

## 2011-11-07 DIAGNOSIS — I712 Thoracic aortic aneurysm, without rupture: Secondary | ICD-10-CM

## 2011-11-07 DIAGNOSIS — I359 Nonrheumatic aortic valve disorder, unspecified: Secondary | ICD-10-CM

## 2011-11-07 DIAGNOSIS — I351 Nonrheumatic aortic (valve) insufficiency: Secondary | ICD-10-CM

## 2011-11-07 NOTE — Progress Notes (Signed)
[address]            [name] [address]          [phone]     CARDIOTHORACIC SURGERY CONSULTATION REPORT  PCP is Letitia Libra, Ala Dach, MD, MD Referring Provider is Verne Carrow, MD  Chief Complaint  Patient presents with  . 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 He was last seen here in the office in June of 2012.  He was seen recently in the office by Dr. Clifton James and a followup CT angiogram of the chest was performed on 11/03/2011. By report this demonstrated significant enlargement of the aortic aneurysm and the patient was referred back to our office for further followup. The patient reports that he has been feeling very well lately. He denies any shortness of breath either with activity or at rest. He has not had any chest pain or back pain which could in any way be related to his aorta. He denies any exertional chest discomfort. He has not had any palpitations, dizzy spells, nor syncope. He has chronic bilateral lower extremity edema which is stable. The remainder of his review of systems is unremarkable.  Past Medical History  Diagnosis Date  . Hypertension   . Aortic insufficiency      02/16/09 Chest CT:  The aortic root has a diameter measuring 4.6 cm, image 64 of the coronal series.  The ascending    thoracic aorta has a diameter of 3.8 cm, image 65 of the coronal series.  At the level of the aortic arch    the thoracic aorta has a maximum diameter of 3.2 cm.  The descending thoracic aorta has a maximum           diameter of 3.2 cm.  There is no evidence for aortic disse        . GERD (gastroesophageal reflux disease)   . Hypokalemia   . Helicobacter  pylori gastritis     (Tx Pylera 4/10)  . Cardiomyopathy      01/2009 - Cath - nonobs. dzs.  EF 35%.  . Gallstones     s/p cholecystectomy    . Sleep apnea     occasionally wears CPAP at night  . CAD (coronary artery disease)   . Aortic root enlargement     The aortic root has a diameter measuring 4.6 cm, image 64 of the coronal series.  The ascending  thoracic aorta has a diameter of 3.8 cm, image 65 of the coronal series.  At the level of the aortic arch   the thoracic aorta has a maximum diameter of 3.2 cm.  The descending thoracic aorta has a maximum   diameter of 3.2 cm.  There is no evidence for aortic dissection.       . Iron deficiency anemia   . CHF (congestive heart failure)   . Obesity, Class III, BMI 40-49.9 (morbid obesity)     Past Surgical History  Procedure Date  . Cholecystectomy   . Adrenalectomy   . Colonoscopy w/ biopsies and polypectomy 11/04/2008    colon polyps, diverticulosis   .  Upper gastrointestinal endoscopy 11/04/2008    w/bx, H pylori gastritis  . Upper gastrointestinal endoscopy 09/25/2011  . Colonoscopy 09/25/2011    Family History  Problem Relation Age of Onset  . Breast cancer Sister   . Colon cancer Brother 70    at least 70  . Prostate cancer Brother   . Diabetes Sister     5 siblings  . Stomach cancer Sister   . Hypertension Mother   . Kidney failure Sister   . Colon cancer Sister   . Breast cancer Sister   . Stroke Father     Social History History  Substance Use Topics  . Smoking status: Never Smoker   . Smokeless tobacco: Never Used  . Alcohol Use: No    Current Outpatient Prescriptions  Medication Sig Dispense Refill  . amLODipine (NORVASC) 10 MG tablet Take 1 tablet (10 mg total) by mouth daily.  30 tablet  8  . aspirin 81 MG tablet Take 81 mg by mouth daily.        . carvedilol (COREG) 25 MG tablet take 1 tablet by mouth twice a day  60 tablet  3  . ferrous sulfate 325 (65 FE) MG tablet take 1 tablet by mouth twice a day  30  tablet  3  . furosemide (LASIX) 40 MG tablet take 1 and 1/2 tablets by mouth every morning  45 tablet  3  . hydrALAZINE (APRESOLINE) 50 MG tablet Take 3 tablets (150 mg total) by mouth daily. TAKE 2 TABLETS IN THE AM, 1 TABLET IN THE PM  90 tablet  5  . MICARDIS HCT 80-25 MG per tablet take 1 tablet by mouth once daily  30 tablet  6  . Multiple Vitamin (MULTIVITAMIN) capsule Take 1 capsule by mouth daily.        Marland Kitchen omeprazole (PRILOSEC) 20 MG capsule Take 1 capsule (20 mg total) by mouth daily. 30 minutes before breakfast  30 capsule  11  . potassium chloride SA (K-DUR,KLOR-CON) 20 MEQ tablet Take 4 tablets by mouth twice daily  240 tablet  6    No Known Allergies  Review of Systems:  General:  normal appetite, normal energy   Respiratory:  no cough, no wheezing, no hemoptysis, no pain with inspiration or cough, no shortness of breath   Cardiac:   no chest pain or tightness, no exertional SOB, no resting SOB, no PND, no orthopnea, + chronic bilateral LE edema, no palpitations, no syncope  GI:   no difficulty swallowing, no hematochezia, no hematemesis, no melena, no constipation, no diarrhea   GU:   no dysuria, no urgency, no frequency   Musculoskeletal: no arthritis, no arthralgia   Vascular:  no pain suggestive of claudication   Neuro:   no symptoms suggestive of TIA's, no seizures, no headaches, no peripheral neuropathy   Endocrine:  Negative   HEENT:  no loose teeth or painful teeth,  no recent vision changes  Psych:   no anxiety, no depression    Physical Exam:   BP 184/76  Pulse 78  Resp 20  Ht 6\' 2"  (1.88 m)  Wt 340 lb (154.223 kg)  BMI 43.65 kg/m2  SpO2 96%  General:  Obese but otherwise  well-appearing  HEENT:  Unremarkable   Neck:   no JVD, no bruits, no adenopathy   Chest:   clear to auscultation, symmetrical breath sounds, no wheezes, no rhonchi   CV:   RRR, grade III/VI diastolic murmur   Abdomen:  soft,  non-tender, no masses   Extremities:  warm, well-perfused,  pulses not palpable, + mild-moderate bilateral LE edema  Rectal/GU  Deferred  Neuro:   Grossly non-focal and symmetrical throughout  Skin:   Clean and dry, no rashes, no breakdown   Diagnostic Tests:  CT ANGIOGRAPHY CHEST 11/03/2011  Technique: Multidetector CT imaging of the chest using the  standard protocol during bolus administration of intravenous  contrast. Multiplanar reconstructed images including MIPs were  obtained and reviewed to evaluate the vascular anatomy.  Contrast: 100 ml of Omnipaque-300  Comparison: Chest CTA 11/04/2010.  Findings:  Mediastinum: The ascending aorta is dilated measuring up to 5.5 cm  (this was measured on a multiplanar reformat in a fashion that is  orthogonal to the direction of blood flow). Overall, this appears  slightly increased compared to the prior examination from 1 year  ago, despite the similar measurements reported (when measured in a  similar fashion on axial images to that measured a year ago,  today's measurement is 6.4 cm, and it was previously 5.5 cm);  however, the measurement is inaccurate on axial images and  overestimates the true size of the aorta. Heart size is mildly  enlarged. There is no significant pericardial fluid, thickening or  pericardial calcification. There is atherosclerosis of the thoracic  aorta, the great vessels of the mediastinum and the coronary  arteries, including calcified atherosclerotic plaque in the left  main, left anterior descending, circumflex and right coronary  arteries. There is a small hiatal hernia. No pathologically  enlarged mediastinal or hilar lymph nodes.  Lungs/Pleura: Assessment of lung parenchyma is limited by patient  respiratory motion. However, there is no definite consolidative  airspace disease and no definite suspicious-appearing pulmonary  nodules or masses. Trace bilateral pleural effusions layer  dependently.  Upper Abdomen: Status post cholecystectomy. Multiple surgical    clips in the right upper quadrant of the abdomen and in the right  retroperitoneum. 1 cm low attenuation lesion in segment 7,  slightly increased in size compared to the prior exam from  11/04/2010, but incompletely characterized on today's study.  Musculoskeletal: There are no aggressive appearing lytic or blastic  lesions noted in the visualized portions of the skeleton.  IMPRESSION:  1. On today's examination, the ascending thoracic aorta measures  5.5 cm in diameter. While this measurement is unchanged compared  to the prior study from 11/04/2010, the ascending aortic aneurysm  is clearly larger than the prior exam. Differences appear related  to measurement technique, and the measurement for the ascending  thoracic aorta on today's study was measured orthogonal to the long  axis of blood flow (the recommended technique). Overall, the  aneurysm is favored to have increased greater than 0.5 cm in  diameter in the last year, and surgical consultation is likely  warranted at this time. This was discussed with Dr. Eden Emms  (covering for Dr. Clifton James) by telephone by Dr. Llana Aliment  on03/15/2013 at to 09:28 AM.  2. Mild cardiomegaly, unchanged.  3. Postsurgical changes in the upper abdomen, as above.  Original Report Authenticated By: Florencia Reasons, M.D.    Transthoracic Echocardiography  Patient: Danyal, Whitenack MR #: [id number] Study Date: 10/31/2011 ------------------------------------------------------------ LV EF: 55% - 60% ------------------------------------------------------------ Study Conclusions  - Left ventricle: The cavity size was normal. Wall thickness was increased in a pattern of mild LVH. Systolic function was normal. The estimated ejection fraction was in the range of 55% to 60%. Wall motion was normal; there were no regional wall motion  abnormalities. Doppler parameters are consistent with abnormal left ventricular relaxation (grade 1 diastolic  dysfunction). - Aortic valve: Mild regurgitation. - Aorta: The aorta was severely dilated and non-diseased. - Aortic root: The aortic root was severely dilated. - Ascending aorta: The ascending aorta was moderately dilated. - Left atrium: The atrium was mildly dilated. - Right atrium: The atrium was mildly dilated. - Impressions: Suggest serial F/U of aortic root with MRI or CT Impressions: - Suggest serial F/U of aortic root with MRI or CT ------------------------------------------------------------ ------------------------------------------------------------ Left ventricle: The cavity size was normal. Wall thickness was increased in a pattern of mild LVH. Systolic function was normal. The estimated ejection fraction was in the range of 55% to 60%. Wall motion was normal; there were no regional wall motion abnormalities. Doppler parameters are consistent with abnormal left ventricular relaxation (grade 1 diastolic dysfunction). ------------------------------------------------------------ Aortic valve: Trileaflet; mildly thickened leaflets. Doppler: There was no stenosis. Mild regurgitation. ------------------------------------------------------------ Aorta: The aorta was severely dilated and non-diseased. Aortic root: The aortic root was severely dilated. Ascending aorta: The ascending aorta was moderately dilated. ------------------------------------------------------------ Mitral valve: Structurally normal valve. Leaflet separation was normal. Doppler: Transvalvular velocity was within the normal range. There was no evidence for stenosis. No regurgitation. Peak gradient: 6mm Hg (D). ------------------------------------------------------------ Left atrium: The atrium was mildly dilated. ------------------------------------------------------------ Right ventricle: The cavity size was normal. Wall thickness was normal. Systolic function was  normal. ------------------------------------------------------------ Pulmonic valve: The valve appears to be grossly normal. Doppler: No significant regurgitation. ------------------------------------------------------------ Tricuspid valve: Structurally normal valve. Leaflet separation was normal. Doppler: Transvalvular velocity was within the normal range. No regurgitation. ------------------------------------------------------------ Pulmonary artery: The main pulmonary artery was normal-sized. Systolic pressure was within the normal range. ----------------------------------------------------------- Right atrium: The atrium was mildly dilated. ------------------------------------------------------------ Pericardium: The pericardium was normal in appearance. ------------------------------------------------------------ Systemic veins: Inferior vena cava: The vessel was normal in size; the respirophasic diameter changes were in the normal range (= 50%); findings are consistent with normal central venous pressure. ------------------------------------------------------------ Post procedure conclusions Ascending Aorta: - The aorta was severely dilated and non-diseased. ------------------------------------------------------------ Prepared and Electronically Authenticated by  Charlton Haws 2013-03-12T10:40:06.590   Impression:  Patient has known aortic insufficiency likely primarily due to annuloaortic ectasia with aneurysmal enlargement of the aortic root and ascending thoracic aorta.  CT angiogram demonstrates some interval increase in size of the aorta over the past year, although in my opinion the true diameter remains < 6 cm.  Recent echocardiogram suggests no interval increase in the severity of aortic insufficiency nor change in LV chamber size or systolic function.  He remains completely asymptomatic at this time. Comorbid medical problems include long-standing hypertension,  morbid obesity, obstructive sleep apnea, congestive heart failure, and pulmonary hypertension.  Options include continued close observation with careful surveillance versus proceeding with elective surgical intervention. Given the patient's long-standing problems with severe hypertension and further increase size of his aortic root, I'm concerned about the risk of acute aortic dissection.   Plan:  I discussed matters at length with the patient here in the office today. The rationale for surgical resection has been discussed in contrast with continued medical followup. The associated risks of surgical intervention of also been discussed, given the fact that there will be significant under the circumstances. The patient is interested in proceeding with surgery in the near future, although he has numerous family issues that her ongoing which will impact his decision about surgery. I feel that we should proceed with transesophageal echocardiogram to better characterize the patient's aortic insufficiency and  the possibility that his native aortic valve could be repaired and preserved at the time of surgery. I also feel that repeat left and right heart catheterization is indicated given that it has been well over 2 years since his most recent cath and he was noted to have significant pulmonary hypertension at that time. We will arrange for these to be done through Dr. Gibson Ramp office plan to see the patient back in 2-3 weeks once these tests at been completed.    Salvatore Decent. Cornelius Moras, MD 11/07/2011 10:44 AM

## 2011-11-07 NOTE — Patient Instructions (Signed)
Proceed with TEE and left+right heart cath with Dr Clifton James

## 2011-11-13 ENCOUNTER — Encounter: Payer: Self-pay | Admitting: *Deleted

## 2011-11-13 ENCOUNTER — Encounter (HOSPITAL_COMMUNITY): Payer: Self-pay | Admitting: Pharmacy Technician

## 2011-11-13 ENCOUNTER — Encounter: Payer: Self-pay | Admitting: Cardiovascular Disease

## 2011-11-13 ENCOUNTER — Ambulatory Visit (INDEPENDENT_AMBULATORY_CARE_PROVIDER_SITE_OTHER): Payer: Medicare Other | Admitting: Cardiovascular Disease

## 2011-11-13 VITALS — BP 142/64 | HR 72 | Ht 73.0 in | Wt 339.0 lb

## 2011-11-13 DIAGNOSIS — I712 Thoracic aortic aneurysm, without rupture, unspecified: Secondary | ICD-10-CM

## 2011-11-13 DIAGNOSIS — I2510 Atherosclerotic heart disease of native coronary artery without angina pectoris: Secondary | ICD-10-CM | POA: Diagnosis not present

## 2011-11-13 LAB — CBC WITH DIFFERENTIAL/PLATELET
Eosinophils Relative: 3.2 % (ref 0.0–5.0)
HCT: 42.3 % (ref 39.0–52.0)
Hemoglobin: 13.8 g/dL (ref 13.0–17.0)
Lymphs Abs: 1.7 10*3/uL (ref 0.7–4.0)
Monocytes Relative: 9 % (ref 3.0–12.0)
Neutro Abs: 5.7 10*3/uL (ref 1.4–7.7)
WBC: 8.5 10*3/uL (ref 4.5–10.5)

## 2011-11-13 LAB — BASIC METABOLIC PANEL
Calcium: 9 mg/dL (ref 8.4–10.5)
GFR: 88.9 mL/min (ref >60.00)
Glucose, Bld: 105 mg/dL — ABNORMAL HIGH (ref 70–99)
Sodium: 141 mEq/L (ref 135–145)

## 2011-11-13 NOTE — Progress Notes (Signed)
History of Present Illness: 71 yo AAM with history of NICM, dilated aortic root, moderate AI, non-obstrucitve CAD, morbid obesity, obstructive sleep apnea, HTN and GERD who was referred in May 2010 for SOB, abdominal swelling and LE edema. He has since been diagnosed with a non-ischemic CM, moderate AI, non-obstructive CAD and a dilated aortic root. He has undergone a right and left heart cathJune 2010 with non-obstructive coronary artery disease. A CT angiogram was performed in June 2010 to look closely at his aortic root and showed moderate dilation of the aortic root (root 4.6 cm, ascending aorta 3.8cm). It is felt that his aortic insufficiency is likely related to the aortic root dilation. His LV is mildly dilated but LV function is now normal. Echo in the spring of 2011 showed enlarging aortic root. I planned on performing an MRI to look at his valve, degree of AI and size of root but he was too large for the scanner. He has been followed by Dr. Cornelius Moras with CVTS. Most recent Chest CTA showed that his aortic root has enlarged. He has been seen by Dr. Cornelius Moras last week. Plans are being made for aortic root replacement.   He has had no chest pain, SOB, DOE, palpitations, near syncope or syncope. His energy level has been good. He has been very active. He has been taking all of his medications. No complaints.    Primary Care Physician: Charlynn Court  Past Medical History  Diagnosis Date  . Hypertension   . Aortic insufficiency      02/16/09 Chest CT:  The aortic root has a diameter measuring 4.6 cm, image 64 of the coronal series.  The ascending    thoracic aorta has a diameter of 3.8 cm, image 65 of the coronal series.  At the level of the aortic arch    the thoracic aorta has a maximum diameter of 3.2 cm.  The descending thoracic aorta has a maximum           diameter of 3.2 cm.  There is no evidence for aortic disse        . GERD (gastroesophageal reflux disease)   . Hypokalemia   . Helicobacter  pylori gastritis     (Tx Pylera 4/10)  . Cardiomyopathy      01/2009 - Cath - nonobs. dzs.  EF 35%.  . Gallstones     s/p cholecystectomy    . Sleep apnea     occasionally wears CPAP at night  . CAD (coronary artery disease)   . Aortic root enlargement     The aortic root has a diameter measuring 4.6 cm, image 64 of the coronal series.  The ascending  thoracic aorta has a diameter of 3.8 cm, image 65 of the coronal series.  At the level of the aortic arch   the thoracic aorta has a maximum diameter of 3.2 cm.  The descending thoracic aorta has a maximum   diameter of 3.2 cm.  There is no evidence for aortic dissection.       . Iron deficiency anemia   . CHF (congestive heart failure)   . Obesity, Class III, BMI 40-49.9 (morbid obesity)     Past Surgical History  Procedure Date  . Cholecystectomy   . Adrenalectomy   . Colonoscopy w/ biopsies and polypectomy 11/04/2008    colon polyps, diverticulosis   . Upper gastrointestinal endoscopy 11/04/2008    w/bx, H pylori gastritis  . Upper gastrointestinal endoscopy 09/25/2011  . Colonoscopy 09/25/2011  Current Outpatient Prescriptions  Medication Sig Dispense Refill  . amLODipine (NORVASC) 10 MG tablet Take 1 tablet (10 mg total) by mouth daily.  30 tablet  8  . aspirin 81 MG tablet Take 81 mg by mouth daily.        . carvedilol (COREG) 25 MG tablet take 1 tablet by mouth twice a day  60 tablet  3  . ferrous sulfate 325 (65 FE) MG tablet take 1 tablet by mouth twice a day  30 tablet  3  . furosemide (LASIX) 40 MG tablet take 1 and 1/2 tablets by mouth every morning  45 tablet  3  . hydrALAZINE (APRESOLINE) 50 MG tablet Take 3 tablets (150 mg total) by mouth daily. TAKE 2 TABLETS IN THE AM, 1 TABLET IN THE PM  90 tablet  5  . MICARDIS HCT 80-25 MG per tablet take 1 tablet by mouth once daily  30 tablet  6  . Multiple Vitamin (MULTIVITAMIN) capsule Take 1 capsule by mouth daily.        Marland Kitchen omeprazole (PRILOSEC) 20 MG capsule Take 1 capsule (20  mg total) by mouth daily. 30 minutes before breakfast  30 capsule  11  . potassium chloride SA (K-DUR,KLOR-CON) 20 MEQ tablet Take 4 tablets by mouth twice daily  240 tablet  6    No Known Allergies  History   Social History  . Marital Status: Married    Spouse Name: Steward Drone    Number of Children: 0  . Years of Education: N/A   Occupational History  .      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 Smoking status: Never Smoker   . Smokeless tobacco: Never Used  . Alcohol Use: No  . Drug Use: No  . Sexually Active: Not on file   Other Topics Concern  . Not on file   Social History Narrative   The patient is married, lives with his wife in  [place].  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  He  lives a very sedentary lifestyle.  He is a nonsmoker.  He denies alcohol consumption.     Family History  Problem Relation Age of Onset  . Breast cancer Sister   . Colon cancer Brother 70    at least 70  . Prostate cancer Brother   . Diabetes Sister     5 siblings  . Stomach cancer Sister   . Hypertension Mother   . Kidney failure Sister   . Colon cancer Sister   . Breast cancer Sister   . Stroke Father     Review of Systems:  As stated in the HPI and otherwise negative.   BP 142/64  Pulse 72  Ht 6\' 1"  (1.854 m)  Wt 339 lb (153.769 kg)  BMI 44.73 kg/m2  Physical Examination: General: Well developed, well nourished, NAD HEENT: OP clear, mucus membranes moist SKIN: warm, dry. No rashes. Neuro: No focal deficits Musculoskeletal: Muscle strength 5/5 all ext Psychiatric: Mood and affect normal Neck: No JVD, no carotid bruits, no thyromegaly, no lymphadenopathy. Lungs:Clear bilaterally, no wheezes, rhonci, crackles Cardiovascular: Regular rate and rhythm. Mild diastolic  Murmur. No gallops or rubs. Abdomen:Soft. Bowel sounds present. Non-tender.  Extremities: No lower extremity edema. Pulses are 2 + in the bilateral  DP/PT.  EKG:  CT ANGIOGRAPHY CHEST 11/03/2011  Technique: Multidetector CT imaging of the chest using the  standard protocol during bolus administration of intravenous  contrast. Multiplanar reconstructed images including MIPs were  obtained and reviewed to evaluate the vascular anatomy.  Contrast: 100 ml of Omnipaque-300  Comparison: Chest CTA 11/04/2010.  Findings:  Mediastinum: The ascending aorta is dilated measuring up to 5.5 cm  (this was measured on a multiplanar reformat in a fashion that is  orthogonal to the direction of blood flow). Overall, this appears  slightly increased compared to the prior examination from 1 year  ago, despite the similar measurements reported (when measured in a  similar fashion on axial images to that measured a year ago,  today's measurement is 6.4 cm, and it was previously 5.5 cm);  however, the measurement is inaccurate on axial images and  overestimates the true size of the aorta. Heart size is mildly  enlarged. There is no significant pericardial fluid, thickening or  pericardial calcification. There is atherosclerosis of the thoracic  aorta, the great vessels of the mediastinum and the coronary  arteries, including calcified atherosclerotic plaque in the left  main, left anterior descending, circumflex and right coronary  arteries. There is a small hiatal hernia. No pathologically  enlarged mediastinal or hilar lymph nodes.  Lungs/Pleura: Assessment of lung parenchyma is limited by patient  respiratory motion. However, there is no definite consolidative  airspace disease and no definite suspicious-appearing pulmonary  nodules or masses. Trace bilateral pleural effusions layer  dependently.  Upper Abdomen: Status post cholecystectomy. Multiple surgical  clips in the right upper quadrant of the abdomen and in the right  retroperitoneum. 1 cm low attenuation lesion in segment 7,  slightly increased in size compared to the prior exam from   11/04/2010, but incompletely characterized on today's study.  Musculoskeletal: There are no aggressive appearing lytic or blastic  lesions noted in the visualized portions of the skeleton.  IMPRESSION:  1. On today's examination, the ascending thoracic aorta measures  5.5 cm in diameter. While this measurement is unchanged compared  to the prior study from 11/04/2010, the ascending aortic aneurysm  is clearly larger than the prior exam. Differences appear related  to measurement technique, and the measurement for the ascending  thoracic aorta on today's study was measured orthogonal to the long  axis of blood flow (the recommended technique). Overall, the  aneurysm is favored to have increased greater than 0.5 cm in  diameter in the last year, and surgical consultation is likely  warranted at this time. This was discussed with Dr. Eden Emms  (covering for Dr. Clifton James) by telephone by Dr. Llana Aliment  on03/15/2013 at to 09:28 AM.  2. Mild cardiomegaly, unchanged.  3. Postsurgical changes in the upper abdomen, as above.  Original Report Authenticated By: Florencia Reasons, M.D.  Echo:  Study Date: 10/31/2011 ------------------------------------------------------------ LV EF: 55% - 60% ------------------------------------------------------------ Study Conclusions  - Left ventricle: The cavity size was normal. Wall thickness was increased in a pattern of mild LVH. Systolic function was normal. The estimated ejection fraction was in the range of 55% to 60%. Wall motion was normal; there were no regional wall motion abnormalities. Doppler parameters are consistent with abnormal left ventricular relaxation (grade 1 diastolic dysfunction). - Aortic valve: Mild regurgitation. - Aorta: The aorta was severely dilated and non-diseased. - Aortic root: The aortic root was severely dilated. - Ascending aorta: The ascending aorta was moderately dilated. - Left atrium: The atrium was mildly  dilated. - Right atrium: The atrium was mildly dilated. - Impressions: Suggest serial F/U of aortic root with MRI or CT Impressions: - Suggest serial F/U of  aortic root with MRI or CT

## 2011-11-13 NOTE — Patient Instructions (Signed)
Your physician recommends that you schedule a follow-up appointment in: 3 weeks.   Your physician has requested that you have a TEE. During a TEE, sound waves are used to create images of your heart. It provides your doctor with information about the size and shape of your heart and how well your heart's chambers and valves are working. In this test, a transducer is attached to the end of a flexible tube that's guided down your throat and into your esophagus (the tube leading from you mouth to your stomach) to get a more detailed image of your heart. You are not awake for the procedure. Please see the instruction sheet given to you today. For further information please visit [email or web address] Scheduled for November 22, 2011   Your physician has requested that you have a cardiac catheterization. Cardiac catheterization is used to diagnose and/or treat various heart conditions. Doctors may recommend this procedure for a number of different reasons. The most common reason is to evaluate chest pain. Chest pain can be a symptom of coronary artery disease (CAD), and cardiac catheterization can show whether plaque is narrowing or blocking your heart's arteries. This procedure is also used to evaluate the valves, as well as measure the blood flow and oxygen levels in different parts of your heart. For further information please visit [email or web address] Please follow instruction sheet, as given. Scheduled for November 22, 2011

## 2011-11-13 NOTE — Assessment & Plan Note (Signed)
Pt has enlarging thoracic aortic aneurysm. He has seen Dr. Cornelius Moras and plans are being made for surgical correction. Also has mild AI. Will arrange TEE next week and then right and left heart cath later in that same day. Risks and benefits reviewed with pt. He agrees to proceed. Labs today.

## 2011-11-16 ENCOUNTER — Other Ambulatory Visit: Payer: Self-pay | Admitting: *Deleted

## 2011-11-16 DIAGNOSIS — I10 Essential (primary) hypertension: Secondary | ICD-10-CM

## 2011-11-16 MED ORDER — AMLODIPINE BESYLATE 10 MG PO TABS: 10.0000 mg | ORAL_TABLET | Freq: Every day | ORAL | Status: DC

## 2011-11-22 ENCOUNTER — Encounter (HOSPITAL_COMMUNITY)
Admission: RE | Disposition: A | Discharge status: Home / Self Care | Payer: Self-pay | Source: Ambulatory Visit | Attending: Cardiology

## 2011-11-22 ENCOUNTER — Other Ambulatory Visit: Payer: Self-pay

## 2011-11-22 ENCOUNTER — Ambulatory Visit (HOSPITAL_COMMUNITY)
Admission: RE | Admit: 2011-11-22 | Discharge: 2011-11-22 | Disposition: A | Discharge status: Home / Self Care | Payer: Medicare Other | Source: Ambulatory Visit | Attending: Cardiology | Admitting: Cardiology

## 2011-11-22 ENCOUNTER — Encounter (HOSPITAL_COMMUNITY): Payer: Self-pay | Admitting: *Deleted

## 2011-11-22 DIAGNOSIS — I714 Abdominal aortic aneurysm, without rupture, unspecified: Secondary | ICD-10-CM | POA: Insufficient documentation

## 2011-11-22 DIAGNOSIS — I712 Thoracic aortic aneurysm, without rupture: Secondary | ICD-10-CM | POA: Diagnosis not present

## 2011-11-22 DIAGNOSIS — I359 Nonrheumatic aortic valve disorder, unspecified: Secondary | ICD-10-CM | POA: Insufficient documentation

## 2011-11-22 HISTORY — PX: LEFT AND RIGHT HEART CATHETERIZATION WITH CORONARY ANGIOGRAM: SHX5449

## 2011-11-22 HISTORY — DX: Cardiac murmur, unspecified: R01.1

## 2011-11-22 HISTORY — PX: TEE WITHOUT CARDIOVERSION: SHX5443

## 2011-11-22 LAB — POCT I-STAT 3, ART BLOOD GAS (G3+)
O2 Saturation: 98 %
TCO2: 31 mmol/L (ref 0–100)

## 2011-11-22 LAB — POCT I-STAT 3, VENOUS BLOOD GAS (G3P V)
Bicarbonate: 26.1 mEq/L — ABNORMAL HIGH (ref 20.0–24.0)
O2 Saturation: 72 %
TCO2: 28 mmol/L (ref 0–100)
pCO2, Ven: 50.9 mmHg — ABNORMAL HIGH (ref 45.0–50.0)
pO2, Ven: 42 mmHg (ref 30.0–45.0)

## 2011-11-22 SURGERY — LEFT AND RIGHT HEART CATHETERIZATION WITH CORONARY ANGIOGRAM
Anesthesia: LOCAL

## 2011-11-22 SURGERY — ECHOCARDIOGRAM, TRANSESOPHAGEAL
Anesthesia: Moderate Sedation

## 2011-11-22 MED ORDER — ONDANSETRON HCL 4 MG/2ML IJ SOLN: 4.0000 mg | Freq: Four times a day (QID) | INTRAMUSCULAR | Status: DC | PRN

## 2011-11-22 MED ORDER — SODIUM CHLORIDE 0.9 % IJ SOLN: 3.0000 mL | INTRAMUSCULAR | Status: DC | PRN

## 2011-11-22 MED ORDER — SODIUM CHLORIDE 0.45 % IV SOLN
INTRAVENOUS | Status: DC
  Administered 2011-11-22: 500 mL via INTRAVENOUS

## 2011-11-22 MED ORDER — SODIUM CHLORIDE 0.9 % IV SOLN: INTRAVENOUS | Status: DC

## 2011-11-22 MED ORDER — NITROGLYCERIN 0.2 MG/ML ON CALL CATH LAB
INTRAVENOUS | Status: AC
  Filled 2011-11-22: qty 1

## 2011-11-22 MED ORDER — MIDAZOLAM HCL 10 MG/2ML IJ SOLN
INTRAMUSCULAR | Status: DC | PRN
  Administered 2011-11-22 (×2): 2 mg via INTRAVENOUS
  Administered 2011-11-22: 1 mg via INTRAVENOUS

## 2011-11-22 MED ORDER — SODIUM CHLORIDE 0.9 % IJ SOLN: 3.0000 mL | Freq: Two times a day (BID) | INTRAMUSCULAR | Status: DC

## 2011-11-22 MED ORDER — SODIUM CHLORIDE 0.9 % IV SOLN: 250.0000 mL | INTRAVENOUS | Status: DC | PRN

## 2011-11-22 MED ORDER — MIDAZOLAM HCL 10 MG/2ML IJ SOLN: 1.0000 mg | Freq: Once | INTRAMUSCULAR | Status: DC

## 2011-11-22 MED ORDER — FENTANYL CITRATE 0.05 MG/ML IJ SOLN
INTRAMUSCULAR | Status: AC
  Filled 2011-11-22: qty 2

## 2011-11-22 MED ORDER — DIAZEPAM 5 MG PO TABS: 5.0000 mg | ORAL_TABLET | ORAL | Status: DC

## 2011-11-22 MED ORDER — ASPIRIN 81 MG PO CHEW
324.0000 mg | CHEWABLE_TABLET | ORAL | Status: DC
  Filled 2011-11-22: qty 4

## 2011-11-22 MED ORDER — BENZOCAINE 20 % MT SOLN
1.0000 "application " | OROMUCOSAL | Status: DC | PRN
  Filled 2011-11-22: qty 57

## 2011-11-22 MED ORDER — LIDOCAINE HCL (PF) 1 % IJ SOLN
INTRAMUSCULAR | Status: AC
  Filled 2011-11-22: qty 30

## 2011-11-22 MED ORDER — ACETAMINOPHEN 325 MG PO TABS: 650.0000 mg | ORAL_TABLET | ORAL | Status: DC | PRN

## 2011-11-22 MED ORDER — MIDAZOLAM HCL 10 MG/2ML IJ SOLN
INTRAMUSCULAR | Status: AC
  Filled 2011-11-22: qty 2

## 2011-11-22 MED ORDER — FENTANYL CITRATE 0.05 MG/ML IJ SOLN
INTRAMUSCULAR | Status: DC | PRN
  Administered 2011-11-22: 50 ug via INTRAVENOUS
  Administered 2011-11-22: 25 ug via INTRAVENOUS

## 2011-11-22 MED ORDER — HEPARIN (PORCINE) IN NACL 2-0.9 UNIT/ML-% IJ SOLN
INTRAMUSCULAR | Status: AC
  Filled 2011-11-22: qty 2000

## 2011-11-22 MED ORDER — BUTAMBEN-TETRACAINE-BENZOCAINE 2-2-14 % EX AERO
INHALATION_SPRAY | CUTANEOUS | Status: DC | PRN
  Administered 2011-11-22: 2 via TOPICAL

## 2011-11-22 MED ORDER — MIDAZOLAM HCL 2 MG/2ML IJ SOLN
INTRAMUSCULAR | Status: AC
  Filled 2011-11-22: qty 2

## 2011-11-22 NOTE — CV Procedure (Signed)
Procedure: TEE  Indication: Ascending aortic aneurysm and aortic insufficiency.   Sedation: Versed 5 mg IV, Fentanyl 75 mcg IV  No complications.   Findings: See full TEE report in echo section of chart.  The LV is normal in size with mild LV hypertrophy, EF 55%.  The aortic valve is trileaflet with no aortic stenosis.  There is some malcoaptation of leaflets due to annular dilatation with moderate aortic regurgitation.  The valve itself appears structurally normal.  There may be dilatation specifically of the right coronary cusp but this is difficult to say for certain as my axial image of the valve seemed somewhat oblique. The aortic root and ascending aorta are dilated with the ascending aorta reaching 5.85 cm in diameter.    Impression: Moderate aortic insufficiency with dilated aortic root and ascending aorta, ascending aorta reaching 5.85 cm in diameter.  There may be specific dilatation of the right coronary cusp.  This patient appears to need replacement of ascending aorta and root.  May be able to do repair of aortic valve.  Would consider cardiac CT to assess the aortic valve and root more closely.

## 2011-11-22 NOTE — H&P (View-Only) (Signed)
History of Present Illness: 71 yo AAM with history of NICM, dilated aortic root, moderate AI, non-obstrucitve CAD, morbid obesity, obstructive sleep apnea, HTN and GERD who was referred in May 2010 for SOB, abdominal swelling and LE edema. He has since been diagnosed with a non-ischemic CM, moderate AI, non-obstructive CAD and a dilated aortic root. He has undergone a right and left heart cathJune 2010 with non-obstructive coronary artery disease. A CT angiogram was performed in June 2010 to look closely at his aortic root and showed moderate dilation of the aortic root (root 4.6 cm, ascending aorta 3.8cm). It is felt that his aortic insufficiency is likely related to the aortic root dilation. His LV is mildly dilated but LV function is now normal. Echo in the spring of 2011 showed enlarging aortic root. I planned on performing an MRI to look at his valve, degree of AI and size of root but he was too large for the scanner. He has been followed by Dr. Cornelius Moras with CVTS. Most recent Chest CTA showed that his aortic root has enlarged. He has been seen by Dr. Cornelius Moras last week. Plans are being made for aortic root replacement.   He has had no chest pain, SOB, DOE, palpitations, near syncope or syncope. His energy level has been good. He has been very active. He has been taking all of his medications. No complaints.    Primary Care Physician: Charlynn Court  Past Medical History  Diagnosis Date  . Hypertension   . Aortic insufficiency      02/16/09 Chest CT:  The aortic root has a diameter measuring 4.6 cm, image 64 of the coronal series.  The ascending    thoracic aorta has a diameter of 3.8 cm, image 65 of the coronal series.  At the level of the aortic arch    the thoracic aorta has a maximum diameter of 3.2 cm.  The descending thoracic aorta has a maximum           diameter of 3.2 cm.  There is no evidence for aortic disse        . GERD (gastroesophageal reflux disease)   . Hypokalemia   . Helicobacter  pylori gastritis     (Tx Pylera 4/10)  . Cardiomyopathy      01/2009 - Cath - nonobs. dzs.  EF 35%.  . Gallstones     s/p cholecystectomy    . Sleep apnea     occasionally wears CPAP at night  . CAD (coronary artery disease)   . Aortic root enlargement     The aortic root has a diameter measuring 4.6 cm, image 64 of the coronal series.  The ascending  thoracic aorta has a diameter of 3.8 cm, image 65 of the coronal series.  At the level of the aortic arch   the thoracic aorta has a maximum diameter of 3.2 cm.  The descending thoracic aorta has a maximum   diameter of 3.2 cm.  There is no evidence for aortic dissection.       . Iron deficiency anemia   . CHF (congestive heart failure)   . Obesity, Class III, BMI 40-49.9 (morbid obesity)     Past Surgical History  Procedure Date  . Cholecystectomy   . Adrenalectomy   . Colonoscopy w/ biopsies and polypectomy 11/04/2008    colon polyps, diverticulosis   . Upper gastrointestinal endoscopy 11/04/2008    w/bx, H pylori gastritis  . Upper gastrointestinal endoscopy 09/25/2011  . Colonoscopy 09/25/2011  Current Outpatient Prescriptions  Medication Sig Dispense Refill  . amLODipine (NORVASC) 10 MG tablet Take 1 tablet (10 mg total) by mouth daily.  30 tablet  8  . aspirin 81 MG tablet Take 81 mg by mouth daily.        . carvedilol (COREG) 25 MG tablet take 1 tablet by mouth twice a day  60 tablet  3  . ferrous sulfate 325 (65 FE) MG tablet take 1 tablet by mouth twice a day  30 tablet  3  . furosemide (LASIX) 40 MG tablet take 1 and 1/2 tablets by mouth every morning  45 tablet  3  . hydrALAZINE (APRESOLINE) 50 MG tablet Take 3 tablets (150 mg total) by mouth daily. TAKE 2 TABLETS IN THE AM, 1 TABLET IN THE PM  90 tablet  5  . MICARDIS HCT 80-25 MG per tablet take 1 tablet by mouth once daily  30 tablet  6  . Multiple Vitamin (MULTIVITAMIN) capsule Take 1 capsule by mouth daily.        Marland Kitchen omeprazole (PRILOSEC) 20 MG capsule Take 1 capsule (20  mg total) by mouth daily. 30 minutes before breakfast  30 capsule  11  . potassium chloride SA (K-DUR,KLOR-CON) 20 MEQ tablet Take 4 tablets by mouth twice daily  240 tablet  6    No Known Allergies  History   Social History  . Marital Status: Married    Spouse Name: Steward Drone    Number of Children: 0  . Years of Education: N/A   Occupational History  .      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 Smoking status: Never Smoker   . Smokeless tobacco: Never Used  . Alcohol Use: No  . Drug Use: No  . Sexually Active: Not on file   Other Topics Concern  . Not on file   Social History Narrative   The patient is married, lives with his wife in  [place].  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  He  lives a very sedentary lifestyle.  He is a nonsmoker.  He denies alcohol consumption.     Family History  Problem Relation Age of Onset  . Breast cancer Sister   . Colon cancer Brother 70    at least 70  . Prostate cancer Brother   . Diabetes Sister     5 siblings  . Stomach cancer Sister   . Hypertension Mother   . Kidney failure Sister   . Colon cancer Sister   . Breast cancer Sister   . Stroke Father     Review of Systems:  As stated in the HPI and otherwise negative.   BP 142/64  Pulse 72  Ht 6\' 1"  (1.854 m)  Wt 339 lb (153.769 kg)  BMI 44.73 kg/m2  Physical Examination: General: Well developed, well nourished, NAD HEENT: OP clear, mucus membranes moist SKIN: warm, dry. No rashes. Neuro: No focal deficits Musculoskeletal: Muscle strength 5/5 all ext Psychiatric: Mood and affect normal Neck: No JVD, no carotid bruits, no thyromegaly, no lymphadenopathy. Lungs:Clear bilaterally, no wheezes, rhonci, crackles Cardiovascular: Regular rate and rhythm. Mild diastolic  Murmur. No gallops or rubs. Abdomen:Soft. Bowel sounds present. Non-tender.  Extremities: No lower extremity edema. Pulses are 2 + in the bilateral  DP/PT.  EKG:  CT ANGIOGRAPHY CHEST 11/03/2011  Technique: Multidetector CT imaging of the chest using the  standard protocol during bolus administration of intravenous  contrast. Multiplanar reconstructed images including MIPs were  obtained and reviewed to evaluate the vascular anatomy.  Contrast: 100 ml of Omnipaque-300  Comparison: Chest CTA 11/04/2010.  Findings:  Mediastinum: The ascending aorta is dilated measuring up to 5.5 cm  (this was measured on a multiplanar reformat in a fashion that is  orthogonal to the direction of blood flow). Overall, this appears  slightly increased compared to the prior examination from 1 year  ago, despite the similar measurements reported (when measured in a  similar fashion on axial images to that measured a year ago,  today's measurement is 6.4 cm, and it was previously 5.5 cm);  however, the measurement is inaccurate on axial images and  overestimates the true size of the aorta. Heart size is mildly  enlarged. There is no significant pericardial fluid, thickening or  pericardial calcification. There is atherosclerosis of the thoracic  aorta, the great vessels of the mediastinum and the coronary  arteries, including calcified atherosclerotic plaque in the left  main, left anterior descending, circumflex and right coronary  arteries. There is a small hiatal hernia. No pathologically  enlarged mediastinal or hilar lymph nodes.  Lungs/Pleura: Assessment of lung parenchyma is limited by patient  respiratory motion. However, there is no definite consolidative  airspace disease and no definite suspicious-appearing pulmonary  nodules or masses. Trace bilateral pleural effusions layer  dependently.  Upper Abdomen: Status post cholecystectomy. Multiple surgical  clips in the right upper quadrant of the abdomen and in the right  retroperitoneum. 1 cm low attenuation lesion in segment 7,  slightly increased in size compared to the prior exam from   11/04/2010, but incompletely characterized on today's study.  Musculoskeletal: There are no aggressive appearing lytic or blastic  lesions noted in the visualized portions of the skeleton.  IMPRESSION:  1. On today's examination, the ascending thoracic aorta measures  5.5 cm in diameter. While this measurement is unchanged compared  to the prior study from 11/04/2010, the ascending aortic aneurysm  is clearly larger than the prior exam. Differences appear related  to measurement technique, and the measurement for the ascending  thoracic aorta on today's study was measured orthogonal to the long  axis of blood flow (the recommended technique). Overall, the  aneurysm is favored to have increased greater than 0.5 cm in  diameter in the last year, and surgical consultation is likely  warranted at this time. This was discussed with Dr. Eden Emms  (covering for Dr. Clifton James) by telephone by Dr. Llana Aliment  on03/15/2013 at to 09:28 AM.  2. Mild cardiomegaly, unchanged.  3. Postsurgical changes in the upper abdomen, as above.  Original Report Authenticated By: Florencia Reasons, M.D.  Echo:  Study Date: 10/31/2011 ------------------------------------------------------------ LV EF: 55% - 60% ------------------------------------------------------------ Study Conclusions  - Left ventricle: The cavity size was normal. Wall thickness was increased in a pattern of mild LVH. Systolic function was normal. The estimated ejection fraction was in the range of 55% to 60%. Wall motion was normal; there were no regional wall motion abnormalities. Doppler parameters are consistent with abnormal left ventricular relaxation (grade 1 diastolic dysfunction). - Aortic valve: Mild regurgitation. - Aorta: The aorta was severely dilated and non-diseased. - Aortic root: The aortic root was severely dilated. - Ascending aorta: The ascending aorta was moderately dilated. - Left atrium: The atrium was mildly  dilated. - Right atrium: The atrium was mildly dilated. - Impressions: Suggest serial F/U of aortic root with MRI or CT Impressions: - Suggest serial F/U of  aortic root with MRI or CT

## 2011-11-22 NOTE — Discharge Instructions (Signed)
Groin Site Care Refer to this sheet in the next few weeks. These instructions provide you with information on caring for yourself after your procedure. Your caregiver may also give you more specific instructions. Your treatment has been planned according to current medical practices, but problems sometimes occur. Call your caregiver if you have any problems or questions after your procedure. HOME CARE INSTRUCTIONS  You may shower 24 hours after the procedure. Remove the bandage (dressing) and gently wash the site with plain soap and water. Gently pat the site dry.   Do not apply powder or lotion to the site.   Do not sit in a bathtub, swimming pool, or whirlpool for 5 to 7 days.   No bending, squatting, or lifting anything over 10 pounds (4.5 kg) as directed by your caregiver.   Inspect the site at least twice daily.   Do not drive home if you are discharged the same day of the procedure. Have someone else drive you.   You may drive 24 hours after the procedure unless otherwise instructed by your caregiver.  What to expect:  Any bruising will usually fade within 1 to 2 weeks.   Blood that collects in the tissue (hematoma) may be painful to the touch. It should usually decrease in size and tenderness within 1 to 2 weeks.  SEEK IMMEDIATE MEDICAL CARE IF:  You have unusual pain at the groin site or down the affected leg.   You have redness, warmth, swelling, or pain at the groin site.   You have drainage (other than a small amount of blood on the dressing).   You have chills.   You have a fever or persistent symptoms for more than 72 hours.   You have a fever and your symptoms suddenly get worse.   Your leg becomes pale, cool, tingly, or numb.   You have heavy bleeding from the site. Hold pressure on the site.  Document Released: 09/09/2010 Document Revised: 07/27/2011 Document Reviewed: 09/09/2010 [facility] Patient Information 2012 [place], [place].Transesophageal  Echocardiography A transesophageal echocardiogram (TEE) is a picture test of your heart using sound waves. The pictures taken can give very detailed pictures of your heart. This can help your doctor see if there are problems with your heart. A TEE can check:  If your heart has blood clots in it.   How well your heart valves are working.   If you have puffiness (swelling) on the inside lining of your heart.   Some of the major arteries of your heart.  TEST  A tube (catheter) is put down your esophagus.   A tube (IV) will be started in your hand or arm. The medicine will be given through the tube to keep you relaxed.   A numbing medicine will be sprayed in the back of your throat to help numb it.   The tip of the probe is placed into the back of your mouth and you will be asked to swallow. This helps to pass the probe into your esophagus.   Once the tip of the probe is in the right place, your doctor can take pictures of your heart.   You may feel pressure at the back of your throat.  AFTER THE TEST  You will be taken to a recovery room area so the sedation can wear off. You will go home when you are fully awake and able to swallow liquids.   Your throat may be sore and scratchy. This normally goes away in 24 hours.  Have someone take you home. Do not drive or use machinery for the rest of the day.  Finding out the results of your test Ask when your test results will be ready. Make sure you get your test results. Document Released: 06/04/2009 Document Revised: 07/27/2011 Document Reviewed: 06/04/2009 [facility] Patient Information 2012 [place], [place].

## 2011-11-22 NOTE — CV Procedure (Signed)
    Cardiac Catheterization Operative Report  David Parsons [id number] 4/3/20132:51 PM Estill Cotta, MD, MD  Procedure Performed:  1. Left Heart Catheterization 2. Selective Coronary Angiography 3. Right Heart Catheterization 4. Aortic root angiogram  Operator: Verne Carrow, MD  Indication:   Dilated aortic root. Pt will need his aortic root surgically repaired. TEE this am. Cath today to exclude progression of CAD.                                 Procedure Details: The risks, benefits, complications, treatment options, and expected outcomes were discussed with the patient. The patient and/or family concurred with the proposed plan, giving informed consent. The patient was brought to the cath lab after IV hydration was begun and oral premedication was given. The patient was further sedated with Versed and Fentanyl. The right groin was prepped and draped in the usual manner. Using the modified Seldinger access technique, a 5 French sheath was placed in the femoral artery. A 6 French sheath was inserted into the right femoral vein. A balloon tipped catheter was used to perform a right heart catheterization. Standard diagnostic catheters were used to perform selective coronary angiography. Due to the patients dilated aortic root, none of our catheters selectively engaged the left main or RCA. I ultimately was able to position a XB LAD 4.5 guiding catheter in the left cusp and take non-selective injections of the left system. I tried a AL-1, AL-2, multipurpose, JL5, JL4 with no success. I was not able to engage the right coronary artery after trying with multiple catheters.  A pigtail catheter was used to perform an aortic root angiogram. There were no immediate complications. The patient was taken to the recovery area in stable condition.   This procedure took 2 hours. 30 minutes of fluoroscopy time.   Hemodynamic Findings: Ao:    135/60            LV:    151/15/36 RA:     6        RV:   44/8/10 PA:   46/27 (37)       PCWP:  19 Fick Cardiac Output: 7.33 L/min Fick Cardiac Index:  2.72 L/min/m2 Central Aortic Saturation: 98% Pulmonary Artery Saturation: 72%   Angiographic Findings:  Left main: No evidence of obstructive disease.   Left Anterior Descending Artery: Large caliber vessel that courses to the apex. No evidence of disease. There is a moderate sized diagonal branch that is free of disease.   Circumflex Artery:  Moderate sized vessel with large OM branch. No obstructive disease.   Right Coronary Artery: Unable to visualize secondary to dilatation of the aortic root causing inability to engage the vessel. No obstructive disease at time of cath in 2010.   Left Ventricular Angiogram: Not performed.   Aortic root is dilated.  Impression: 1. No obstructive CAD noted in left coronary system.  2. Inability to visualize the right coronary system. 3. Dilated aortic root  Recommendations: Will discuss with CT surgery. We may need to arrange a coronary CTA to document patency of the RCA before his surgery is planned. This will need to be delayed by at least a week with large contrast load used today.        Complications:  None; patient tolerated the procedure well.

## 2011-11-22 NOTE — Progress Notes (Signed)
Pt recovered in Endo after TEE.  Transferred to cath lab at 1235.

## 2011-11-22 NOTE — Interval H&P Note (Signed)
History and Physical Interval Note:  11/22/2011 11:33 AM  David Parsons  has presented today for surgery, with the diagnosis of enlarged aortic root  The various methods of treatment have been discussed with the patient and family. After consideration of risks, benefits and other options for treatment, the patient has consented to  Procedure(s) (LRB): TRANSESOPHAGEAL ECHOCARDIOGRAM (TEE) (N/A) as a surgical intervention .  The patients' history has been reviewed, patient examined, no change in status, stable for surgery.  I have reviewed the patients' chart and labs.  Questions were answered to the patient's satisfaction.     Burtis Imhoff [employer]

## 2011-11-22 NOTE — Interval H&P Note (Signed)
History and Physical Interval Note:  11/22/2011 1:01 PM  David Parsons  has presented today for cardiac cath with the diagnosis of R/O CAD  The various methods of treatment have been discussed with the patient and family. After consideration of risks, benefits and other options for treatment, the patient has consented to  Procedure(s) (LRB): LEFT AND RIGHT HEART CATHETERIZATION WITH CORONARY ANGIOGRAM (N/A) as a surgical intervention .  The patients' history has been reviewed, patient examined, no change in status, stable for surgery.  I have reviewed the patients' chart and labs.  Questions were answered to the patient's satisfaction.     Malick Netz

## 2011-11-23 ENCOUNTER — Encounter (HOSPITAL_COMMUNITY): Payer: Self-pay | Admitting: Cardiology

## 2011-11-27 ENCOUNTER — Encounter: Payer: Self-pay | Admitting: Thoracic Surgery (Cardiothoracic Vascular Surgery)

## 2011-11-27 ENCOUNTER — Ambulatory Visit (INDEPENDENT_AMBULATORY_CARE_PROVIDER_SITE_OTHER): Payer: Medicare Other | Admitting: Thoracic Surgery (Cardiothoracic Vascular Surgery)

## 2011-11-27 VITALS — BP 161/66 | HR 73 | Resp 18 | Ht 73.0 in | Wt 342.0 lb

## 2011-11-27 DIAGNOSIS — I712 Thoracic aortic aneurysm, without rupture: Secondary | ICD-10-CM

## 2011-11-27 DIAGNOSIS — I359 Nonrheumatic aortic valve disorder, unspecified: Secondary | ICD-10-CM | POA: Diagnosis not present

## 2011-11-27 NOTE — Patient Instructions (Signed)
Avoid heavy lifting or strenuous physical activity and make certain to keep blood pressure under very good control.

## 2011-11-27 NOTE — Progress Notes (Signed)
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 Since he was last seen in the office the patient underwent left and right heart catheterization and transesophageal echocardiogram.  Dr. Sanjuana Kava was unable to cannulate the right coronary artery at catheterization do to the patient's aneurysm with unusual angulation of the right coronary takeoff. Cardiac gated CT angiogram has been suggested but not yet performed. Since the patient's last office visit his sister has been scheduled for brain surgery sometime later this month. The patient reports that he remains completely asymptomatic and he hopes to delay his surgery until after his sister has recovered somewhat.   Current Outpatient Prescriptions  Medication Sig Dispense Refill  . amLODipine (NORVASC) 10 MG tablet Take 1 tablet (10 mg total) by mouth daily.  30 tablet  8  . aspirin 81 MG tablet Take 81 mg by mouth daily.        . carvedilol (COREG) 25 MG tablet Take 25 mg by mouth 2 (two) times daily with a meal.      . ferrous sulfate 325 (65 FE) MG tablet Take 325 mg by mouth 2 (two) times daily before a meal.      . furosemide (LASIX) 40 MG tablet Take 60 mg by mouth daily with breakfast.      . hydrALAZINE (APRESOLINE) 50 MG tablet Take 50-100 mg by mouth 2 (two) times daily. Take 2 tablets in the morning and 1 tablet in the evening      . MICARDIS HCT 80-25 MG per tablet take 1 tablet by mouth once daily  30 tablet  6  . Multiple Vitamin (MULITIVITAMIN WITH MINERALS) TABS Take 1 tablet by mouth daily.      Marland Kitchen omeprazole (PRILOSEC) 20 MG capsule Take 1 capsule (20 mg total) by mouth daily. 30 minutes before breakfast  30 capsule  11  . potassium chloride SA  (K-DUR,KLOR-CON) 20 MEQ tablet Take 80 mEq by mouth 2 (two) times daily.          Physical Exam:   BP 161/66  Pulse 73  Resp 18  Ht 6\' 1"  (1.854 m)  Wt 342 lb (155.13 kg)  BMI 45.12 kg/m2  SpO2 96%  General:  Well-appearing  Chest:   Clear to auscultation  CV:   Regular rate and rhythm with diastolic murmur  Incisions:  n/a  Abdomen:  Soft and very obese  Extremities:  Warm and well-perfused  Diagnostic Tests:  Transesophageal Echocardiography  Patient:    David Parsons, David Parsons MR #:       [id number] Study Date: 11/22/2011 ------------------------------------------------------------ LV EF: 55% -   60% ------------------------------------------------------------ Study Conclusions  - Left ventricle: The cavity size was normal. There was mild   concentric hypertrophy. Systolic function was normal. The   estimated ejection fraction was in the range of 55% to   60%. - Aortic valve: Trileaflet; moderately thickened leaflets.   There was no stenosis. Moderate regurgitation likely due   to annular dilatation with inadequate leaflet coaptation. - Aorta: Dilated aortic root and ascending aorta. Root 4.2  cm, ascending aorta 5.8 cm just above sinotubular   junction. I question whether the right coronary cusp is   dilated out of proportion to other cusps but it was only   seen in an off-axis view on this TEE. Could assess better   with CTA. Normal caliber descending thoracic aorta and   aortic arch. - Mitral valve: Trivial regurgitation. - Left atrium: The atrium was mildly dilated. No evidence of   thrombus in the atrial cavity or appendage. - Right ventricle: Poorly visualized. The cavity size was   normal. Systolic function was normal. - Right atrium: No evidence of thrombus in the atrial cavity   or appendage. - Atrial septum: Weakly positive bubble study, suspect small   PFO. ------------------------------------------------------------ Left ventricle:  The cavity size was  normal. There was mild concentric hypertrophy. Systolic function was normal. The estimated ejection fraction was in the range of 55% to 60%. ------------------------------------------------------------ Aortic valve:   Trileaflet; moderately thickened leaflets. Doppler:   There was no stenosis.   Moderate regurgitation likely due to annular dilatation with inadequate leaflet coaptation.    Mean gradient: 5mm Hg (S). ------------------------------------------------------------ Aorta:  Dilated aortic root and ascending aorta. Root 4.2 cm, ascending aorta 5.8 cm just above sinotubular junction. I question whether the right coronary cusp is dilated out of proportion to other cusps but it was only seen in an off-axis view on this TEE. Could assess better with CTA. Normal caliber descending thoracic aorta and aortic arch. ------------------------------------------------------------ Mitral valve:   Doppler:   There was no evidence for stenosis.    Trivial regurgitation. ------------------------------------------------------------ Left atrium:  The atrium was mildly dilated.  No evidence of thrombus in the atrial cavity or appendage. ------------------------------------------------------------ Atrial septum:  Weakly positive bubble study, suspect small PFO. ------------------------------------------------------------ Right ventricle:  Poorly visualized. The cavity size was normal. Systolic function was normal. ------------------------------------------------------------ Pulmonic valve:    Structurally normal valve.   Cusp separation was normal. ----------------------------------------------------------- Tricuspid valve:   Doppler:   Trivial regurgitation. ------------------------------------------------------------ Right atrium:  The atrium was normal in size.  No evidence of thrombus in the atrial cavity or  appendage. ------------------------------------------------------------ Pericardium:  There was no pericardial effusion. ------------------------------------------------------------ Post procedure conclusions Ascending Aorta: - Dilated aortic root and ascending aorta. Root 4.2 cm,   ascending aorta 5.8 cm just above sinotubular junction. I   question whether the right coronary cusp is dilated out of   proportion to other cusps but it was only seen in an   off-axis view on this TEE. Could assess better with CTA.   Normal caliber descending thoracic aorta and aortic arch. ------------------------------------------------------------ Marca Ancona, MD [id number]:[id number]   Cardiac Catheterization Operative Report  David Parsons [id number] 4/3/20132:51 PM  Procedure Details: The risks, benefits, complications, treatment options, and expected outcomes were discussed with the patient. The patient and/or family concurred with the proposed plan, giving informed consent. The patient was brought to the cath lab after IV hydration was begun and oral premedication was given. The patient was further sedated with Versed and Fentanyl. The right groin was prepped and draped in the usual manner. Using the modified Seldinger access technique, a 5 French sheath was placed in the femoral artery. A 6 French sheath was inserted into the right femoral vein. A balloon tipped catheter was used to perform a right heart catheterization. Standard diagnostic catheters were used to perform selective coronary angiography. Due to the patients dilated aortic root, none of our catheters selectively engaged the left main or  RCA. I ultimately was able to position a XB LAD 4.5 guiding catheter in the left cusp and take non-selective injections of the left system. I tried a AL-1, AL-2, multipurpose, JL5, JL4 with no success. I was not able to engage the right coronary artery after trying with multiple catheters.  A pigtail  catheter was used to perform an aortic root angiogram. There were no immediate complications. The patient was taken to the recovery area in stable condition.   This procedure took 2 hours. 30 minutes of fluoroscopy time.   Hemodynamic Findings: Ao:    135/60            LV:    151/15/36 RA:    6        RV:   44/8/10 PA:   46/27 (37)       PCWP: 19 Fick Cardiac Output: 7.33 L/min Fick Cardiac Index:  2.72 L/min/m2 Central Aortic Saturation: 98% Pulmonary Artery Saturation: 72%  Angiographic Findings:  Left main: No evidence of obstructive disease.   Left Anterior Descending Artery: Large caliber vessel that courses to the apex. No evidence of disease. There is a moderate sized diagonal branch that is free of disease.    Circumflex Artery:  Moderate sized vessel with large OM branch. No obstructive disease.    Right Coronary Artery: Unable to visualize secondary to dilatation of the aortic root causing inability to engage the vessel. No obstructive disease at time of cath in 2010.   Left Ventricular Angiogram: Not performed.   Aortic root is dilated.  Impression: 1. No obstructive CAD noted in left coronary system.   2. Inability to visualize the right coronary system. 3. Dilated aortic root  Recommendations: Will discuss with CT surgery. We may need to arrange a coronary CTA to document patency of the RCA before his surgery is planned. This will need to be delayed by at least a week with large contrast load used today.          Complications:  None; patient tolerated the procedure well.  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 The patient had mild-moderate pulmonary hypertension at catheterization and no significant coronary artery disease although the right coronary artery could not be visualized.  He may have a small PFO as noted on TEE.  He wants to delay surgery until his sister  has recovered from her surgery.  Plan:  We will obtain cardiac gated CT angiogram of the chest to evaluate the patient's coronary arteries and in particular the proximal right coronary artery system. This will also be helpful for evaluation of the patient's known aortic root aneurysm. We will plan to see the patient back in 6 weeks and possibly schedule elective surgical intervention at that time.   Salvatore Decent. Cornelius Moras, MD 11/27/2011 9:57 AM

## 2011-11-28 ENCOUNTER — Other Ambulatory Visit: Payer: Self-pay | Admitting: Thoracic Surgery (Cardiothoracic Vascular Surgery)

## 2011-11-29 ENCOUNTER — Other Ambulatory Visit: Payer: Self-pay | Admitting: Thoracic Surgery (Cardiothoracic Vascular Surgery)

## 2011-11-29 DIAGNOSIS — I712 Thoracic aortic aneurysm, without rupture: Secondary | ICD-10-CM

## 2011-11-29 DIAGNOSIS — I359 Nonrheumatic aortic valve disorder, unspecified: Secondary | ICD-10-CM

## 2011-11-30 ENCOUNTER — Encounter: Payer: Self-pay | Admitting: *Deleted

## 2011-12-07 ENCOUNTER — Encounter: Payer: Self-pay | Admitting: Cardiovascular Disease

## 2011-12-07 ENCOUNTER — Ambulatory Visit (INDEPENDENT_AMBULATORY_CARE_PROVIDER_SITE_OTHER): Payer: Medicare Other | Admitting: Cardiovascular Disease

## 2011-12-07 VITALS — BP 144/72 | HR 70 | Ht 73.0 in | Wt 344.0 lb

## 2011-12-07 DIAGNOSIS — I712 Thoracic aortic aneurysm, without rupture: Secondary | ICD-10-CM

## 2011-12-07 DIAGNOSIS — I10 Essential (primary) hypertension: Secondary | ICD-10-CM | POA: Diagnosis not present

## 2011-12-07 NOTE — Patient Instructions (Signed)
Your physician wants you to follow-up in: 6 months with Dr. Clifton James.  You will receive a reminder letter in the mail two months in advance. If you don't receive a letter, please call our office to schedule the follow-up appointment.  Increase Hydralazine dose to 100mg  twice daily.

## 2011-12-07 NOTE — Progress Notes (Signed)
History of Present Illness: 71 yo AAM with history of NICM, dilated aortic root, moderate AI, non-obstrucitve CAD, morbid obesity, obstructive sleep apnea, HTN and GERD who was referred in May 2010 for SOB, abdominal swelling and LE edema. He has since been diagnosed with a non-ischemic CM, moderate AI, non-obstructive CAD and a dilated aortic root. He has undergone a right and left heart cathJune 2010 with non-obstructive coronary artery disease. A CT angiogram was performed in June 2010 to look closely at his aortic root and showed moderate dilation of the aortic root (root 4.6 cm, ascending aorta 3.8cm). It is felt that his aortic insufficiency is likely related to the aortic root dilation. His LV is mildly dilated but LV function is now normal. Echo in the spring of 2011 showed enlarging aortic root. I planned on performing an MRI to look at his valve, degree of AI and size of root but he was too large for the scanner. He has been followed by Dr. Cornelius Moras with CVTS. Most recent Chest CTA showed that his aortic root has enlarged. Based on this, he was referred back to Dr. Cornelius Moras mid March and plans are being made for aortic root replacement. I saw him 11/13/11 and arranged a cardiac cath on 11/22/11 which showed enlarged root, no obstructive disease in the left system. I could not engage the RCA secondary to the abnormal takeoff with the enlarged aortic root. He was seen by Dr. Cornelius Moras last week and plans are for f/u in 6 weeks to plan surgery.   He is here today for cath follow up. He has had no chest pain, SOB, DOE, palpitations, near syncope or syncope. His energy level has been good. He has been very active. He has been taking all of his medications. No complaints.   Primary Care Physician: Charlynn Court  Past Medical History  Diagnosis Date  . Hypertension   . Aortic insufficiency      02/16/09 Chest CT:  The aortic root has a diameter measuring 4.6 cm, image 64 of the coronal series.  The ascending     thoracic aorta has a diameter of 3.8 cm, image 65 of the coronal series.  At the level of the aortic arch    the thoracic aorta has a maximum diameter of 3.2 cm.  The descending thoracic aorta has a maximum           diameter of 3.2 cm.  There is no evidence for aortic disse        . GERD (gastroesophageal reflux disease)   . Hypokalemia   . Helicobacter pylori gastritis     (Tx Pylera 4/10)  . Cardiomyopathy      01/2009 - Cath - nonobs. dzs.  EF 35%.  . Gallstones     s/p cholecystectomy    . Sleep apnea     occasionally wears CPAP at night  . CAD (coronary artery disease)   . Aortic root enlargement     The aortic root has a diameter measuring 4.6 cm, image 64 of the coronal series.  The ascending  thoracic aorta has a diameter of 3.8 cm, image 65 of the coronal series.  At the level of the aortic arch   the thoracic aorta has a maximum diameter of 3.2 cm.  The descending thoracic aorta has a maximum   diameter of 3.2 cm.  There is no evidence for aortic dissection.       . Iron deficiency anemia   . CHF (congestive  heart failure)   . Obesity, Class III, BMI 40-49.9 (morbid obesity)   . Heart murmur     Past Surgical History  Procedure Date  . Cholecystectomy   . Adrenalectomy   . Colonoscopy w/ biopsies and polypectomy 11/04/2008    colon polyps, diverticulosis   . Upper gastrointestinal endoscopy 11/04/2008    w/bx, H pylori gastritis  . Upper gastrointestinal endoscopy 09/25/2011  . Colonoscopy 09/25/2011  . Multiple tooth extractions   . Tee without cardioversion 11/22/2011    Procedure: TRANSESOPHAGEAL ECHOCARDIOGRAM (TEE);  Surgeon: Laurey Morale, MD;  Location: [facility] ENDOSCOPY;  Service: Cardiovascular;  Laterality: N/A;    Current Outpatient Prescriptions  Medication Sig Dispense Refill  . amLODipine (NORVASC) 10 MG tablet Take 1 tablet (10 mg total) by mouth daily.  30 tablet  8  . aspirin 81 MG tablet Take 81 mg by mouth daily.        . carvedilol (COREG) 25 MG tablet Take  25 mg by mouth 2 (two) times daily with a meal.      . ferrous sulfate 325 (65 FE) MG tablet Take 325 mg by mouth 2 (two) times daily before a meal.      . furosemide (LASIX) 40 MG tablet Take 60 mg by mouth daily with breakfast.      . hydrALAZINE (APRESOLINE) 50 MG tablet Take 2 tablets in the morning and 1 tablet in the evening      . MICARDIS HCT 80-25 MG per tablet take 1 tablet by mouth once daily  30 tablet  6  . Multiple Vitamin (MULITIVITAMIN WITH MINERALS) TABS Take 1 tablet by mouth daily.      Marland Kitchen omeprazole (PRILOSEC) 20 MG capsule Take 1 capsule (20 mg total) by mouth daily. 30 minutes before breakfast  30 capsule  11  . potassium chloride SA (K-DUR,KLOR-CON) 20 MEQ tablet Take 80 mEq by mouth 2 (two) times daily.        Allergies  Allergen Reactions  . Latex Hives    Latex gloves     History   Social History  . Marital Status: Married    Spouse Name: Steward Drone    Number of Children: 0  . Years of Education: N/A   Occupational History  .      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 Smoking status: Never Smoker   . Smokeless tobacco: Never Used  . Alcohol Use: No  . Drug Use: No  . Sexually Active: Not on file   Other Topics Concern  . Not on file   Social History Narrative   The patient is married, lives with his wife in  [place].  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  He  lives a very sedentary lifestyle.  He is a nonsmoker.  He denies alcohol consumption.     Family History  Problem Relation Age of Onset  . Breast cancer Sister   . Colon cancer Brother 70    at least 70  . Prostate cancer Brother   . Diabetes Sister     5 siblings  . Stomach cancer Sister   . Hypertension Mother   . Kidney failure Sister   . Colon cancer Sister   . Breast cancer Sister   . Stroke Father     Review of Systems:  As stated in the HPI and otherwise negative.   BP 144/72  Pulse 70  Ht 6\' 1"  (1.854  m)  Wt 344 lb  (156.037 kg)  BMI 45.39 kg/m2  Physical Examination: General: Well developed, well nourished, NAD HEENT: OP clear, mucus membranes moist SKIN: warm, dry. No rashes. Neuro: No focal deficits Musculoskeletal: Muscle strength 5/5 all ext Psychiatric: Mood and affect normal Neck: No JVD, no carotid bruits, no thyromegaly, no lymphadenopathy. Lungs:Clear bilaterally, no wheezes, rhonci, crackles Cardiovascular: Regular rate and rhythm. No murmurs, gallops or rubs. Abdomen:Soft. Bowel sounds present. Non-tender.  Extremities: No lower extremity edema. Pulses are 2 + in the bilateral DP/PT.  Cardiac cath 11/22/11: Left main: No evidence of obstructive disease.  Left Anterior Descending Artery: Large caliber vessel that courses to the apex. No evidence of disease. There is a moderate sized diagonal branch that is free of disease.  Circumflex Artery: Moderate sized vessel with large OM branch. No obstructive disease.  Right Coronary Artery: Unable to visualize secondary to dilatation of the aortic root causing inability to engage the vessel. No obstructive disease at time of cath in 2010.  Left Ventricular Angiogram: Not performed.  Aortic root is dilated.  Impression:  1. No obstructive CAD noted in left coronary system.  2. Inability to visualize the right coronary system.  3. Dilated aortic root

## 2011-12-07 NOTE — Assessment & Plan Note (Signed)
BP is elevated today. Will increase Hydralazine as above.

## 2011-12-07 NOTE — Assessment & Plan Note (Signed)
Asymptomatic. Plans for surgical replacement per Dr. Cornelius Moras in next 2 months. BP is slightly elevated today. Will increase his Hydralazine to 100 mg po BID. Continue other meds. Plans are in place for him to have a coronary CTA to make sure his RCA is ok. This vessel could not be engaged during cath. F/u with Dr. Cornelius Moras in 5 weeks.

## 2011-12-08 ENCOUNTER — Other Ambulatory Visit: Payer: Self-pay | Admitting: Thoracic Surgery (Cardiothoracic Vascular Surgery)

## 2011-12-08 ENCOUNTER — Other Ambulatory Visit: Payer: Self-pay

## 2011-12-08 DIAGNOSIS — I10 Essential (primary) hypertension: Secondary | ICD-10-CM

## 2011-12-08 DIAGNOSIS — I359 Nonrheumatic aortic valve disorder, unspecified: Secondary | ICD-10-CM | POA: Diagnosis not present

## 2011-12-08 DIAGNOSIS — I712 Thoracic aortic aneurysm, without rupture: Secondary | ICD-10-CM | POA: Diagnosis not present

## 2011-12-08 LAB — BUN: BUN: 12 mg/dL (ref 6–23)

## 2011-12-08 MED ORDER — AMLODIPINE BESYLATE 10 MG PO TABS: 10.0000 mg | ORAL_TABLET | Freq: Every day | ORAL | Status: DC

## 2011-12-11 ENCOUNTER — Other Ambulatory Visit: Payer: Self-pay | Admitting: Thoracic Surgery (Cardiothoracic Vascular Surgery)

## 2011-12-13 ENCOUNTER — Other Ambulatory Visit (HOSPITAL_COMMUNITY): Payer: Medicare Other

## 2011-12-13 ENCOUNTER — Other Ambulatory Visit: Payer: Self-pay | Admitting: Thoracic Surgery (Cardiothoracic Vascular Surgery)

## 2011-12-13 ENCOUNTER — Ambulatory Visit (HOSPITAL_COMMUNITY)
Admission: RE | Admit: 2011-12-13 | Discharge: 2011-12-13 | Disposition: A | Discharge status: Home / Self Care | Payer: Medicare Other | Source: Ambulatory Visit | Attending: Thoracic Surgery (Cardiothoracic Vascular Surgery) | Admitting: Thoracic Surgery (Cardiothoracic Vascular Surgery)

## 2011-12-13 DIAGNOSIS — I359 Nonrheumatic aortic valve disorder, unspecified: Secondary | ICD-10-CM | POA: Diagnosis not present

## 2011-12-13 DIAGNOSIS — I2510 Atherosclerotic heart disease of native coronary artery without angina pectoris: Secondary | ICD-10-CM | POA: Insufficient documentation

## 2011-12-13 DIAGNOSIS — N400 Enlarged prostate without lower urinary tract symptoms: Secondary | ICD-10-CM | POA: Diagnosis not present

## 2011-12-13 DIAGNOSIS — I712 Thoracic aortic aneurysm, without rupture, unspecified: Secondary | ICD-10-CM | POA: Insufficient documentation

## 2011-12-13 DIAGNOSIS — I719 Aortic aneurysm of unspecified site, without rupture: Secondary | ICD-10-CM

## 2011-12-13 DIAGNOSIS — K5730 Diverticulosis of large intestine without perforation or abscess without bleeding: Secondary | ICD-10-CM | POA: Diagnosis not present

## 2011-12-13 MED ORDER — IOHEXOL 350 MG/ML SOLN
80.0000 mL | Freq: Once | INTRAVENOUS | Status: AC | PRN
  Administered 2011-12-13: 80 mL via INTRAVENOUS

## 2011-12-13 MED ORDER — METOPROLOL TARTRATE 1 MG/ML IV SOLN
5.0000 mg | INTRAVENOUS | Status: DC | PRN
  Administered 2011-12-13 (×3): 5 mg via INTRAVENOUS
  Filled 2011-12-13: qty 5

## 2011-12-13 MED ORDER — METOPROLOL TARTRATE 1 MG/ML IV SOLN
INTRAVENOUS | Status: AC
  Filled 2011-12-13: qty 15

## 2011-12-13 MED ORDER — NITROGLYCERIN 0.4 MG SL SUBL
0.4000 mg | SUBLINGUAL_TABLET | Freq: Once | SUBLINGUAL | Status: DC
  Filled 2011-12-13: qty 25

## 2011-12-13 MED ORDER — IOHEXOL 350 MG/ML SOLN
100.0000 mL | Freq: Once | INTRAVENOUS | Status: AC | PRN
  Administered 2011-12-13: 100 mL via INTRAVENOUS

## 2011-12-13 MED ORDER — NITROGLYCERIN 0.4 MG SL SUBL
SUBLINGUAL_TABLET | SUBLINGUAL | Status: AC
  Administered 2011-12-13: 0.4 mg
  Filled 2011-12-13: qty 25

## 2011-12-15 ENCOUNTER — Encounter: Payer: Self-pay | Admitting: Thoracic Surgery (Cardiothoracic Vascular Surgery)

## 2011-12-15 ENCOUNTER — Other Ambulatory Visit: Payer: Self-pay

## 2011-12-15 ENCOUNTER — Ambulatory Visit (INDEPENDENT_AMBULATORY_CARE_PROVIDER_SITE_OTHER): Payer: Medicare Other | Admitting: Thoracic Surgery (Cardiothoracic Vascular Surgery)

## 2011-12-15 ENCOUNTER — Other Ambulatory Visit: Payer: Self-pay | Admitting: Thoracic Surgery (Cardiothoracic Vascular Surgery)

## 2011-12-15 VITALS — BP 146/67 | HR 74 | Resp 20 | Ht 73.0 in | Wt 344.0 lb

## 2011-12-15 DIAGNOSIS — I712 Thoracic aortic aneurysm, without rupture: Secondary | ICD-10-CM

## 2011-12-15 DIAGNOSIS — I359 Nonrheumatic aortic valve disorder, unspecified: Secondary | ICD-10-CM

## 2011-12-15 NOTE — Progress Notes (Signed)
[address]            [name] [address]          [phone]     CARDIOTHORACIC SURGERY OFFICE NOTE  Referring Provider is Kathleene Hazel* PCP is Letitia Libra, Ala Dach, MD, MD   HPI:  Patient returns for further followup of his ascending thoracic aortic aneurysm. He underwent cardiac gated CT angiogram to evaluate his proximal coronary artery anatomy and his aortic root aneurysm. Unfortunately, this scan was of limited quality and imaging of the proximal right coronary artery was completely suboptimal. In addition, this scan is reported to demonstrate some interval increase in size of the aneurysm in comparison with the previous CT angiogram from March of this year. We contact the patient in asked him to come into the office today to discuss these findings. He reports that he feels well and has no new problems or complaints.   Current Outpatient Prescriptions  Medication Sig Dispense Refill  . amLODipine (NORVASC) 10 MG tablet Take 1 tablet (10 mg total) by mouth daily.  30 tablet  11  . aspirin 81 MG tablet Take 81 mg by mouth daily.        . carvedilol (COREG) 25 MG tablet Take 25 mg by mouth 2 (two) times daily with a meal.      . ferrous sulfate 325 (65 FE) MG tablet Take 325 mg by mouth 2 (two) times daily before a meal.      . furosemide (LASIX) 40 MG tablet Take 60 mg by mouth daily with breakfast.      . hydrALAZINE (APRESOLINE) 50 MG tablet Take 100 mg by mouth 2 (two) times daily. 50 mg tab, 2 tablets po BID      . MICARDIS HCT 80-25 MG per tablet take 1 tablet by mouth once daily  30 tablet  6  . Multiple Vitamin (MULITIVITAMIN WITH MINERALS) TABS Take 1 tablet by mouth daily.      Marland Kitchen omeprazole (PRILOSEC) 20 MG capsule Take 1 capsule (20 mg total) by mouth daily. 30 minutes before breakfast  30 capsule  11  . potassium chloride SA (K-DUR,KLOR-CON) 20 MEQ tablet Take 80 mEq by mouth 2 (two) times daily.          Physical  Exam:   BP 146/67  Pulse 74  Resp 20  Ht 6\' 1"  (1.854 m)  Wt 344 lb (156.037 kg)  BMI 45.39 kg/m2  SpO2 96%  General:  Obese but well-appearing  Chest:   Clear to auscultation  CV:   Regular rate and rhythm  Incisions:  n/a  Abdomen:  Obese but soft and nontender  Extremities:  Warm and well-perfused   Diagnostic Tests:  *RADIOLOGY REPORT*  Clinical Data: Aortic root aneurysm.  CT ANGIOGRAPHY CHEST, ABDOMEN AND PELVIS  Technique: Multidetector CT imaging through the chest, abdomen and  pelvis was performed using the standard protocol during bolus  administration of intravenous contrast. Multiplanar reconstructed  images including MIPs were obtained and reviewed to evaluate the  vascular anatomy.  Contrast: OMNIPAQUE IOHEXOL 350 MG/ML SOLN  Comparison: 11/03/2011  CTA CHEST  Findings: There is good contrast opacification of the pulmonary  artery branches. No discrete filling defect to suggest acute  PE.The exam was not optimized for detection of pulmonary emboli.  Coronary arterial calcifications are noted.  The ascending aorta measures 5.4 cm  diameter at the sinotubular  junction, 5.6 cm proximal ascending, tapering to 3.8 cm in the  distal ascending/proximal arch, distal arch 3.4 cm, proximal  descending 3.8 cm, distal descending 2.9 cm just above the  diaphragm. There is patchy calcified nonocclusive plaque in the  distal arch and descending segments. No evidence of dissection.  There is classic three-vessel brachiocephalic arterial origin  anatomy with minimal plaque in the proximal subclavian artery, no  stenosis.  No pleural or pericardial effusion. No hilar or mediastinal  adenopathy. Lungs are clear. Minimal spondylitic changes in the  lower thoracic spine.  Review of the MIP images confirms the above findings.  IMPRESSION:  1. 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  2. Patchy coronary and aortic calcifications.  CTA ABDOMEN AND PELVIS    Arterial findings:  Aorta: Scattered calcified nonocclusive plaque.  No dissection or aneurysm.  Celiac axis: Calcified ostial plaque resulting in mild  short segment stenosis of less than 1 cm, patent distally.  Superior mesenteric:Widely patent, with classic distal branching  anatomy.  Left renal: Single, with partially calcified ostial  plaque, no stenosis, patent distally.  Right renal: Single, patent.  Inferior mesenteric:Minimal calcified ostial plaque without  stenosis, patent.  Left iliac: Mildly tortuous system with scattered  calcified nonocclusive plaque, no stenosis or aneurysm.  Right iliac: Mildly tortuous system, with scattered  calcified nonocclusive plaque, no aneurysm.  Venous findings: Dedicated venous phase imaging was not obtained.  Patency of the portal, splenic, superior mesenteric veins noted.  Review of the MIP images confirms the above findings.  Nonvascular findings: Stable sub centimeter low attenuation lesions  in the posterior right and lateral left hepatic segments.  Vascular clips in the gallbladder fossa. Unremarkable spleen,  adrenal glands. Stable small renal cysts. No hydronephrosis.  Mild pancreatic parenchymal atrophy. No mesenteric,  retroperitoneal, or pelvic adenopathy. There is marked prostatic  enlargement. Urinary bladder is incompletely distended. Stomach  and small bowel decompressed. Normal appendix. The colon is  nondilated with scattered descending and sigmoid diverticula; no  adjacent inflammatory/edematous change. No ascites. No free air.  Degenerative changes in the lower lumbar spine.  IMPRESSION:  1. Negative for abdominal aortic aneurysm or significant branch  occlusive disease.  2. Prostatic enlargement.  3. Scattered colonic diverticula.  Original Report Authenticated By: Osa Craver, M.D.  OVER-READ INTERPRETATION - CT CHEST  The following report is an over-read performed by radiologist Dr.  Florencia Reasons, M.D. of [facility] on 12/14/2011  11:50:41. This over-read does not include interpretation of  cardiac or coronary anatomy or pathology. The CTA interpretation  by the cardiologist is attached.  Comparison: Chest CT 11/03/2011.  Findings: Again noted is an aneurysm of the proximal ascending  thoracic aorta which measures up to 6.2 cm in diameter (this has  increased compared to the prior examination, however, the prior  examination was not gated making a direct comparison is difficult).  No consolidative airspace disease, suspicious-appearing pulmonary  nodules or pleural effusions within the visualized portions of the  thorax. Visualized upper abdomen is unremarkable. There are no  aggressive appearing lytic or blastic lesions noted in the  visualized portions of the skeleton.  IMPRESSION:  1. Marked aneurysmal dilatation of the proximal descending  thoracic aorta (immediately above the level of the sinuses of  Valsalva) which appears to have increased compared to the recent  prior examination. This rapid expansion could suggest an unstable  aneurysm.  These results were called by telephone  on 12/13/2011 at 12:05  p.m. to Dr. Eden Emms, who verbally acknowledged these results. These  results were also communicated to Dr. Tyrone Sage of Cardiothoracic  Surgery on 12/13/2011 at 12:25 p.m.  Cardiac CTA:  Indication: Ascending aortic aneurysm. Need for AVR with root  replacement Unable to adequately visualize coronary arteries  during catheterization  Protocol: Patient scanned on a Philips 256 with gantry rotation  speed of 270 msec and collimation of .9mm The patient was not an  ideal patient for CTA. His weight was over 350 lbs. It took over  two hours to place a suitable i.v. for contrast injection at  5cc/sec. US guidance was finally used. Despite 15mg  of i.v.  lopresser and amiodarone the patient had frequent PAC;s. We left  KV at 120 and did not use idose because  of the patient size. SL  nitro was given. A retrospective protocol was used due to  arrhythmia. Following the attempt at cardiac CTA using 80cc of  contrast the patient had a non gated CTA from the arch to mid femur  to further assess his aortic aneurysm  Coronary Arteries: The scan was nondiagnositc. There was moderate  calcification of the distal LM and proximal LAD. The LM, proximal  LAD and proximal section of a large diagonal branch were patent.  The proximal circumflex was patent. The origin of the left main  was normal from the left cusp. The origin of the RCA was not  visualized. The proximal RCA was patent with mild calcification.  Gated Images showed left ventricular enlargement with normal LV  systolic function. EF could not be quantified due to poor cavity  opacification.  The AV was trileaflet. See dictation from Radiology regarding  severe dilatation of the sinus of valsalva area of the aorta. The  ascending aorta measures 5.4 cm diameter at the sinotubular and  5.6 cm in the proximal ascending aorta. Normal origian of the  great vessels from the aortic arch.  Impression:  1) Nondiagnostic coronary CTA due to morbid obesity and  arrhythmia. Moderate calcification of distal LM and proximal LAD.  Normal origin of LM and patent proximal LAD and first diagonal.  Mild calcification of prosimal RCA. Origin not seen. Patent  proximal RCA  2) Qualitatively normal EF on retrospective gated images with  LVE  3) Trileaflet AV with severe aneurysmal dilatation of the  ascending aorta 5.6 cm see radiology report of nongated aortic CTA.  Original Report Authenticated By: [email or web address]   Impression:  Enlarging aneurysm of the aortic root and ascending thoracic aorta with associated moderate central aortic insufficiency. I favor proceeding with valve sparing aortic root replacement in the near future. I am not entirely convinced that the aneurysm has dramatically change in size over the  past month, but there is no question that it has been enlarging. Unfortunately, we still have not been able to image the right coronary artery sufficiently. Under the circumstances we could attempt repeat cardiac catheterization, assume the absence of coronary artery disease and do nothing, or plan to perform single-vessel coronary artery bypass grafting to the right coronary system at the time of root replacement. Because of the very large root aneurysm and the need to implant the coronary artery, I think it might be wise simply to plan on coronary artery bypass grafting and not worry about trying to repeat the catheterization.  I've encouraged patient to move the date of surgery up a soon as practical given the concern is that his aneurysm may be enlarging  in size.   Plan:  Mr Plotts sister is scheduled to have brain surgery next week. He is agreeable to moving surgery up the following week as long as she is recovering uneventfully. We will tentatively plan to proceed on Thursday, Dec 28, 2011 for valve-sparing aortic root replacement and coronary artery bypass grafting with possible Bentall aortic root replacement if his native aortic valve cannot be preserved.  The patient was counseled at length regarding surgical alternatives with respect to valve replacement. In particular, discussion was held comparing in contrast and the risks of mechanical valve replacement and the need for lifelong anticoagulation versus use of a bioprosthetic tissue valve and the associated potential for late structural valve deterioration in failure. This discussion was placed in the context of the patient's particular circumstances, and as a result the patient specifically requests that their valve be replaced using a bioprosthetic tissue valve .  They understand and accept all potential associated risks of surgery including but not limited to risk of death, stroke, myocardial infarction, congestive heart failure, respiratory  failure, renal failure, pneumonia, bleeding requiring blood transfusion and or reexploration, arrhythmia, heart block or bradycardia requiring permanent pacemaker, pleural effusions or other delayed complications related to continued congestive heart failure, or other late complications related to valve replacement.  All of his questions been addressed.      Salvatore Decent. Cornelius Moras, MD 12/15/2011 12:39 PM

## 2011-12-19 ENCOUNTER — Encounter (HOSPITAL_COMMUNITY): Payer: Self-pay | Admitting: Pharmacy Technician

## 2011-12-25 NOTE — Pre-Procedure Instructions (Addendum)
[address] David Parsons  12/25/2011   Your procedure is scheduled on:  12/28/11  Report to [facility] at 530 AM.  Call this number if you have problems the morning of surgery: [phone]   Remember:   Do not eat food:After Midnight.  May have clear liquids: up to 4 Hours before arrival.  Clear liquids include soda, tea, black coffee, apple or grape juice, broth.  Take these medicines the morning of surgery with A SIP OF WATER: amlodipine,carvedilol, prilosec, hydralizine,  STOP NSAIDS,  ASPIRIN AS DIRECTED   Do not wear jewelry, make-up or nail polish.  Do not wear lotions, powders, or perfumes.   Do not bring valuables to the hospital.  Contacts, dentures or bridgework may not be worn into surgery.  Leave suitcase in the car. After surgery it may be brought to your room.  For patients admitted to the hospital, checkout time is 11:00 AM the day of discharge.   Patients discharged the day of surgery will not be allowed to drive home.  Name and phone number of your driver:  Special Instructions: Incentive Spirometry - Practice and bring it with you on the day of surgery. and CHG Shower Use Special Wash: 1/2 bottle night before surgery and 1/2 bottle morning of surgery.   Please read over the following fact sheets that you were given: Pain Booklet, Open Heart Packet, MRSA Information and Surgical Site Infection Prevention

## 2011-12-26 ENCOUNTER — Inpatient Hospital Stay (HOSPITAL_COMMUNITY)
Admission: RE | Admit: 2011-12-26 | Discharge: 2011-12-26 | Disposition: A | Discharge status: Home / Self Care | Payer: Medicare Other | Source: Ambulatory Visit | Attending: Thoracic Surgery (Cardiothoracic Vascular Surgery) | Admitting: Thoracic Surgery (Cardiothoracic Vascular Surgery)

## 2011-12-26 ENCOUNTER — Encounter (HOSPITAL_COMMUNITY): Payer: Self-pay

## 2011-12-26 ENCOUNTER — Encounter (HOSPITAL_COMMUNITY)
Admission: RE | Admit: 2011-12-26 | Discharge: 2011-12-26 | Disposition: A | Discharge status: Home / Self Care | Payer: Medicare Other | Source: Ambulatory Visit | Attending: Thoracic Surgery (Cardiothoracic Vascular Surgery) | Admitting: Thoracic Surgery (Cardiothoracic Vascular Surgery)

## 2011-12-26 ENCOUNTER — Ambulatory Visit (HOSPITAL_COMMUNITY)
Admission: RE | Admit: 2011-12-26 | Discharge: 2011-12-26 | Disposition: A | Discharge status: Home / Self Care | Payer: Medicare Other | Source: Ambulatory Visit | Attending: Thoracic Surgery (Cardiothoracic Vascular Surgery) | Admitting: Thoracic Surgery (Cardiothoracic Vascular Surgery)

## 2011-12-26 DIAGNOSIS — I712 Thoracic aortic aneurysm, without rupture, unspecified: Secondary | ICD-10-CM | POA: Insufficient documentation

## 2011-12-26 DIAGNOSIS — Z01812 Encounter for preprocedural laboratory examination: Secondary | ICD-10-CM | POA: Diagnosis not present

## 2011-12-26 DIAGNOSIS — Z01818 Encounter for other preprocedural examination: Secondary | ICD-10-CM | POA: Diagnosis not present

## 2011-12-26 DIAGNOSIS — I2789 Other specified pulmonary heart diseases: Secondary | ICD-10-CM | POA: Diagnosis not present

## 2011-12-26 DIAGNOSIS — I359 Nonrheumatic aortic valve disorder, unspecified: Secondary | ICD-10-CM | POA: Insufficient documentation

## 2011-12-26 DIAGNOSIS — Z01810 Encounter for preprocedural cardiovascular examination: Secondary | ICD-10-CM

## 2011-12-26 HISTORY — DX: Nausea with vomiting, unspecified: R11.2

## 2011-12-26 HISTORY — DX: Other specified postprocedural states: Z98.890

## 2011-12-26 LAB — CBC
HCT: 42.8 % (ref 39.0–52.0)
Hemoglobin: 14.4 g/dL (ref 13.0–17.0)
MCH: 30.5 pg (ref 26.0–34.0)
MCV: 90.7 fL (ref 78.0–100.0)
Platelets: 251 10*3/uL (ref 150–400)
RBC: 4.72 MIL/uL (ref 4.22–5.81)
WBC: 7.9 10*3/uL (ref 4.0–10.5)

## 2011-12-26 LAB — COMPREHENSIVE METABOLIC PANEL
AST: 16 U/L (ref 0–37)
BUN: 13 mg/dL (ref 6–23)
CO2: 25 mEq/L (ref 19–32)
Calcium: 9.3 mg/dL (ref 8.4–10.5)
Chloride: 104 mEq/L (ref 96–112)
Creatinine, Ser: 0.87 mg/dL (ref 0.50–1.35)
GFR calc Af Amer: >90 mL/min (ref >90)
GFR calc non Af Amer: 86 mL/min — ABNORMAL LOW (ref >90)
Glucose, Bld: 103 mg/dL — ABNORMAL HIGH (ref 70–99)
Total Bilirubin: 0.9 mg/dL (ref 0.3–1.2)

## 2011-12-26 LAB — URINALYSIS, ROUTINE W REFLEX MICROSCOPIC
Bilirubin Urine: NEGATIVE
Hgb urine dipstick: NEGATIVE
Nitrite: NEGATIVE
Specific Gravity, Urine: 1.018 (ref 1.005–1.030)
Urobilinogen, UA: 1 mg/dL (ref 0.0–1.0)
pH: 5 (ref 5.0–8.0)

## 2011-12-26 LAB — BLOOD GAS, ARTERIAL
Acid-Base Excess: 3.6 mmol/L — ABNORMAL HIGH (ref 0.0–2.0)
Bicarbonate: 27.8 mEq/L — ABNORMAL HIGH (ref 20.0–24.0)
Drawn by: 181601
O2 Saturation: 95.8 %
TCO2: 29.1 mmol/L (ref 0–100)
pCO2 arterial: 43.5 mmHg (ref 35.0–45.0)
pO2, Arterial: 77.7 mmHg — ABNORMAL LOW (ref 80.0–100.0)

## 2011-12-26 LAB — SURGICAL PCR SCREEN
MRSA, PCR: NEGATIVE
Staphylococcus aureus: NEGATIVE

## 2011-12-26 LAB — PROTIME-INR: Prothrombin Time: 13.5 seconds (ref 11.6–15.2)

## 2011-12-26 LAB — HEMOGLOBIN A1C: Mean Plasma Glucose: 120 mg/dL — ABNORMAL HIGH (ref <117)

## 2011-12-26 LAB — ABO/RH: ABO/RH(D): O POS

## 2011-12-26 LAB — APTT: aPTT: 29 seconds (ref 24–37)

## 2011-12-26 MED ORDER — CHLORHEXIDINE GLUCONATE 4 % EX LIQD: 30.0000 mL | CUTANEOUS | Status: DC

## 2011-12-26 MED ORDER — ALBUTEROL SULFATE (5 MG/ML) 0.5% IN NEBU
2.5000 mg | INHALATION_SOLUTION | Freq: Once | RESPIRATORY_TRACT | Status: AC
  Administered 2011-12-26: 2.5 mg via RESPIRATORY_TRACT

## 2011-12-26 NOTE — Progress Notes (Signed)
Pre-op Cardiac Surgery  Carotid Findings:  Bilaterally no significant ICA stenosis with antegrade vertebral flow.  Upper Extremity Right Left  Brachial Pressures 151 146  Radial Waveforms Tri Tri  Ulnar Waveforms Tri Tri  Palmar Arch (Allen's Test) Waveform remains normal with radial compression and obliterates with ulnar compression Waveform remains normal with radial compression and obliterates with ulnar compression    Bilateral palpable pedal pulses.   Farrel Demark, RDMS

## 2011-12-27 MED ORDER — EPINEPHRINE HCL 1 MG/ML IJ SOLN
0.5000 ug/min | INTRAVENOUS | Status: DC
  Filled 2011-12-27: qty 4

## 2011-12-27 MED ORDER — SODIUM CHLORIDE 0.9 % IV SOLN
0.1000 ug/kg/h | INTRAVENOUS | Status: AC
  Administered 2011-12-28: .2 ug/kg/h via INTRAVENOUS
  Filled 2011-12-27: qty 4

## 2011-12-27 MED ORDER — SODIUM BICARBONATE 8.4 % IV SOLN
INTRAVENOUS | Status: AC
  Administered 2011-12-28: 12:00:00
  Filled 2011-12-27: qty 2.5

## 2011-12-27 MED ORDER — METOPROLOL TARTRATE 12.5 MG HALF TABLET: 12.5000 mg | ORAL_TABLET | Freq: Once | ORAL | Status: DC

## 2011-12-27 MED ORDER — POTASSIUM CHLORIDE 2 MEQ/ML IV SOLN
80.0000 meq | INTRAVENOUS | Status: DC
  Filled 2011-12-27: qty 40

## 2011-12-27 MED ORDER — DEXTROSE 5 % IV SOLN
1.5000 g | INTRAVENOUS | Status: AC
  Administered 2011-12-28: .75 g via INTRAVENOUS
  Administered 2011-12-28: 1.5 g via INTRAVENOUS
  Filled 2011-12-27 (×2): qty 1.5

## 2011-12-27 MED ORDER — MAGNESIUM SULFATE 50 % IJ SOLN
40.0000 meq | INTRAMUSCULAR | Status: DC
  Filled 2011-12-27: qty 10

## 2011-12-27 MED ORDER — TRANEXAMIC ACID (OHS) PUMP PRIME SOLUTION
2.0000 mg/kg | INTRAVENOUS | Status: DC
  Filled 2011-12-27: qty 3.12

## 2011-12-27 MED ORDER — TRANEXAMIC ACID 100 MG/ML IV SOLN
1.5000 mg/kg/h | INTRAVENOUS | Status: AC
  Administered 2011-12-28: 1.5 mg/kg/h via INTRAVENOUS
  Filled 2011-12-27: qty 25

## 2011-12-27 MED ORDER — PHENYLEPHRINE HCL 10 MG/ML IJ SOLN
30.0000 ug/min | INTRAVENOUS | Status: AC
  Administered 2011-12-28: 20 ug/min via INTRAVENOUS
  Filled 2011-12-27: qty 2

## 2011-12-27 MED ORDER — DOPAMINE-DEXTROSE 3.2-5 MG/ML-% IV SOLN
2.0000 ug/kg/min | INTRAVENOUS | Status: DC
  Administered 2011-12-28: 3 ug/kg/min via INTRAVENOUS
  Filled 2011-12-27: qty 250

## 2011-12-27 MED ORDER — TRANEXAMIC ACID (OHS) BOLUS VIA INFUSION
15.0000 mg/kg | INTRAVENOUS | Status: AC
  Administered 2011-12-28: 2340 mg via INTRAVENOUS
  Filled 2011-12-27: qty 2340

## 2011-12-27 MED ORDER — VANCOMYCIN HCL 1000 MG IV SOLR
1500.0000 mg | INTRAVENOUS | Status: AC
  Administered 2011-12-28: 1500 mg via INTRAVENOUS
  Filled 2011-12-27: qty 1500

## 2011-12-27 MED ORDER — DEXTROSE 5 % IV SOLN
750.0000 mg | INTRAVENOUS | Status: DC
  Filled 2011-12-27: qty 750

## 2011-12-27 MED ORDER — SODIUM CHLORIDE 0.9 % IV SOLN
INTRAVENOUS | Status: AC
  Administered 2011-12-28: 3.2 [IU]/h via INTRAVENOUS
  Filled 2011-12-27: qty 1

## 2011-12-27 MED ORDER — NITROGLYCERIN IN D5W 200-5 MCG/ML-% IV SOLN
2.0000 ug/min | INTRAVENOUS | Status: AC
  Administered 2011-12-28: 5 ug/min via INTRAVENOUS
  Filled 2011-12-27: qty 250

## 2011-12-27 NOTE — Consult Note (Signed)
Anesthesia Chart Review:  Patient is a 71 year old male scheduled for a valve sparing aortic root replacement with R&G ascending thoracic aorta, CABG, closure of PFO on 12/28/11.  PCP is Dr. Charlynn Court.  Cardiologist is Dr. Verne Carrow.  Other history includes non-smoker, post-op N/V, GERD, HTN, CAD, AI, CM, CHF, OSA, iron-deficiency anemia, morbid obesity.  EKG on 12/26/11 showed SR, PACs, LAD, LVH.  Cath on 11/22/11 showed: 1. No obstructive CAD noted in left coronary system.  2. Inability to visualize the right coronary system.  3. Dilated aortic root   TEE on 11/22/11 showed: Moderate aortic insufficiency with dilated aortic root and ascending aorta, ascending aorta reaching 5.85 cm in diameter. There may be specific dilatation of the right coronary cusp. This patient appears to need replacement of ascending aorta and root. May be able to do repair of aortic valve. Would consider cardiac CT to assess the aortic valve and root more closely.   Cardiac CTA on 12/13/11 showed: 1) Nondiagnostic coronary CTA due to morbid obesity and  arrhythmia. Moderate calcification of distal LM and proximal LAD. Normal origin of LM and patent proximal LAD and first diagonal. Mild calcification of prosimal RCA. Origin not seen. 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 4)  Marked aneurysmal dilatation of the proximal descending  thoracic aorta (immediately above the level of the sinuses of  Valsalva) which appears to have increased compared to the recent prior examination. This rapid expansion could suggest an unstable aneurysm.  2D echo on 10/31/11 showed: - Left ventricle: The cavity size was normal. Wall thickness was increased in a pattern of mild LVH. Systolic function was normal. The estimated ejection fraction was in  the range of 55% to 60%. Wall motion was normal; there were no regional wall motion abnormalities. Doppler parameters are consistent with abnormal left ventricular relaxation (grade 1 diastolic dysfunction). - Aortic valve: Mild regurgitation. - Aorta: The aorta was severely dilated and non-diseased. - Aortic root: The aortic root was severely dilated. - Ascending aorta: The ascending aorta was moderately dilated. - Left atrium: The atrium was mildly dilated. - Right atrium: The atrium was mildly dilated. - Impressions: Suggest serial F/U of aortic root with MRI or CT.  CXR on 12/26/11 showed no acute process.  Labs reviewed.  Anticipate patient can proceed as planned.  Shonna Chock, PA-C

## 2011-12-28 ENCOUNTER — Encounter (HOSPITAL_COMMUNITY): Payer: Self-pay | Admitting: Thoracic Surgery (Cardiothoracic Vascular Surgery)

## 2011-12-28 ENCOUNTER — Encounter (HOSPITAL_COMMUNITY)
Admission: RE | Disposition: A | Discharge status: Home / Self Care | Payer: Self-pay | Source: Ambulatory Visit | Attending: Thoracic Surgery (Cardiothoracic Vascular Surgery)

## 2011-12-28 ENCOUNTER — Encounter (HOSPITAL_COMMUNITY): Payer: Self-pay

## 2011-12-28 ENCOUNTER — Encounter (HOSPITAL_COMMUNITY): Payer: Self-pay | Admitting: Vascular Surgery

## 2011-12-28 ENCOUNTER — Inpatient Hospital Stay (HOSPITAL_COMMUNITY)
Admission: RE | Admit: 2011-12-28 | Discharge: 2012-01-02 | DRG: 220 | Disposition: A | Discharge status: Home / Self Care | Payer: Medicare Other | Source: Ambulatory Visit | Attending: Thoracic Surgery (Cardiothoracic Vascular Surgery) | Admitting: Thoracic Surgery (Cardiothoracic Vascular Surgery)

## 2011-12-28 ENCOUNTER — Inpatient Hospital Stay (HOSPITAL_COMMUNITY): Payer: Medicare Other

## 2011-12-28 ENCOUNTER — Ambulatory Visit (HOSPITAL_COMMUNITY): Payer: Medicare Other | Admitting: Vascular Surgery

## 2011-12-28 DIAGNOSIS — Z951 Presence of aortocoronary bypass graft: Secondary | ICD-10-CM

## 2011-12-28 DIAGNOSIS — I712 Thoracic aortic aneurysm, without rupture, unspecified: Principal | ICD-10-CM | POA: Diagnosis present

## 2011-12-28 DIAGNOSIS — I4892 Unspecified atrial flutter: Secondary | ICD-10-CM | POA: Diagnosis present

## 2011-12-28 DIAGNOSIS — I428 Other cardiomyopathies: Secondary | ICD-10-CM | POA: Diagnosis present

## 2011-12-28 DIAGNOSIS — I359 Nonrheumatic aortic valve disorder, unspecified: Secondary | ICD-10-CM

## 2011-12-28 DIAGNOSIS — I517 Cardiomegaly: Secondary | ICD-10-CM | POA: Diagnosis not present

## 2011-12-28 DIAGNOSIS — Z09 Encounter for follow-up examination after completed treatment for conditions other than malignant neoplasm: Secondary | ICD-10-CM | POA: Diagnosis not present

## 2011-12-28 DIAGNOSIS — L608 Other nail disorders: Secondary | ICD-10-CM | POA: Diagnosis present

## 2011-12-28 DIAGNOSIS — I2510 Atherosclerotic heart disease of native coronary artery without angina pectoris: Secondary | ICD-10-CM | POA: Diagnosis not present

## 2011-12-28 DIAGNOSIS — I4891 Unspecified atrial fibrillation: Secondary | ICD-10-CM | POA: Diagnosis present

## 2011-12-28 DIAGNOSIS — I509 Heart failure, unspecified: Secondary | ICD-10-CM | POA: Diagnosis present

## 2011-12-28 DIAGNOSIS — K219 Gastro-esophageal reflux disease without esophagitis: Secondary | ICD-10-CM | POA: Diagnosis not present

## 2011-12-28 DIAGNOSIS — I700 Atherosclerosis of aorta: Secondary | ICD-10-CM | POA: Diagnosis not present

## 2011-12-28 DIAGNOSIS — Z952 Presence of prosthetic heart valve: Secondary | ICD-10-CM

## 2011-12-28 DIAGNOSIS — Z6841 Body Mass Index (BMI) 40.0 and over, adult: Secondary | ICD-10-CM

## 2011-12-28 DIAGNOSIS — G4730 Sleep apnea, unspecified: Secondary | ICD-10-CM | POA: Diagnosis present

## 2011-12-28 DIAGNOSIS — J90 Pleural effusion, not elsewhere classified: Secondary | ICD-10-CM | POA: Diagnosis not present

## 2011-12-28 DIAGNOSIS — I10 Essential (primary) hypertension: Secondary | ICD-10-CM | POA: Diagnosis not present

## 2011-12-28 DIAGNOSIS — D62 Acute posthemorrhagic anemia: Secondary | ICD-10-CM | POA: Diagnosis not present

## 2011-12-28 HISTORY — PX: CARDIAC VALVE REPLACEMENT: SHX585

## 2011-12-28 HISTORY — PX: BENTALL PROCEDURE: SHX5058

## 2011-12-28 HISTORY — PX: CORONARY ARTERY BYPASS GRAFT: SHX141

## 2011-12-28 HISTORY — DX: Presence of prosthetic heart valve: Z95.2

## 2011-12-28 LAB — POCT I-STAT 3, ART BLOOD GAS (G3+)
Acid-Base Excess: 2 mmol/L (ref 0.0–2.0)
Acid-base deficit: 1 mmol/L (ref 0.0–2.0)
Acid-base deficit: 2 mmol/L (ref 0.0–2.0)
Bicarbonate: 25.7 mEq/L — ABNORMAL HIGH (ref 20.0–24.0)
Bicarbonate: 22.9 mEq/L (ref 20.0–24.0)
Bicarbonate: 23 mEq/L (ref 20.0–24.0)
O2 Saturation: 100 %
O2 Saturation: 92 %
Patient temperature: 35.3
TCO2: 24 mmol/L (ref 0–100)
pCO2 arterial: 33.2 mmHg — ABNORMAL LOW (ref 35.0–45.0)
pO2, Arterial: 400 mmHg — ABNORMAL HIGH (ref 80.0–100.0)
pO2, Arterial: 264 mmHg — ABNORMAL HIGH (ref 80.0–100.0)
pO2, Arterial: 57 mmHg — ABNORMAL LOW (ref 80.0–100.0)

## 2011-12-28 LAB — CBC
HCT: 36.3 % — ABNORMAL LOW (ref 39.0–52.0)
Hemoglobin: 12.4 g/dL — ABNORMAL LOW (ref 13.0–17.0)
MCH: 29.9 pg (ref 26.0–34.0)
MCH: 30.8 pg (ref 26.0–34.0)
MCHC: 32.9 g/dL (ref 30.0–36.0)
MCHC: 34.2 g/dL (ref 30.0–36.0)
MCV: 90.9 fL (ref 78.0–100.0)
MCV: 90.1 fL (ref 78.0–100.0)
Platelets: 200 10*3/uL (ref 150–400)
RDW: 13.1 % (ref 11.5–15.5)

## 2011-12-28 LAB — POCT I-STAT 4, (NA,K, GLUC, HGB,HCT)
Glucose, Bld: 165 mg/dL — ABNORMAL HIGH (ref 70–99)
Glucose, Bld: 116 mg/dL — ABNORMAL HIGH (ref 70–99)
Glucose, Bld: 136 mg/dL — ABNORMAL HIGH (ref 70–99)
Glucose, Bld: 129 mg/dL — ABNORMAL HIGH (ref 70–99)
HCT: 39 % (ref 39.0–52.0)
HCT: 37 % — ABNORMAL LOW (ref 39.0–52.0)
HCT: 34 % — ABNORMAL LOW (ref 39.0–52.0)
HCT: 33 % — ABNORMAL LOW (ref 39.0–52.0)
Hemoglobin: 12.6 g/dL — ABNORMAL LOW (ref 13.0–17.0)
Hemoglobin: 11.6 g/dL — ABNORMAL LOW (ref 13.0–17.0)
Hemoglobin: 11.6 g/dL — ABNORMAL LOW (ref 13.0–17.0)
Potassium: 3.7 mEq/L (ref 3.5–5.1)
Potassium: 4.3 mEq/L (ref 3.5–5.1)
Potassium: 4.6 mEq/L (ref 3.5–5.1)
Potassium: 3.6 mEq/L (ref 3.5–5.1)
Sodium: 139 mEq/L (ref 135–145)
Sodium: 142 mEq/L (ref 135–145)
Sodium: 141 mEq/L (ref 135–145)
Sodium: 143 mEq/L (ref 135–145)
Sodium: 142 mEq/L (ref 135–145)

## 2011-12-28 LAB — POCT I-STAT, CHEM 8
Chloride: 110 mEq/L (ref 96–112)
Creatinine, Ser: 1 mg/dL (ref 0.50–1.35)
Hemoglobin: 12.2 g/dL — ABNORMAL LOW (ref 13.0–17.0)
Potassium: 3.5 mEq/L (ref 3.5–5.1)
Sodium: 143 mEq/L (ref 135–145)

## 2011-12-28 LAB — PROTIME-INR: Prothrombin Time: 15.5 seconds — ABNORMAL HIGH (ref 11.6–15.2)

## 2011-12-28 LAB — CREATININE, SERUM: Creatinine, Ser: 0.83 mg/dL (ref 0.50–1.35)

## 2011-12-28 SURGERY — CORONARY ARTERY BYPASS GRAFTING (CABG)
Anesthesia: General | Site: Chest | Wound class: Clean

## 2011-12-28 MED ORDER — MILRINONE IN DEXTROSE 200-5 MCG/ML-% IV SOLN
INTRAVENOUS | Status: DC | PRN
  Administered 2011-12-28: 0.375 ug/kg/min via INTRAVENOUS

## 2011-12-28 MED ORDER — POTASSIUM CHLORIDE 10 MEQ/50ML IV SOLN
10.0000 meq | INTRAVENOUS | Status: AC
  Administered 2011-12-28 (×3): 10 meq via INTRAVENOUS

## 2011-12-28 MED ORDER — SODIUM CHLORIDE 0.9 % IJ SOLN: 3.0000 mL | INTRAMUSCULAR | Status: DC | PRN

## 2011-12-28 MED ORDER — HEPARIN SODIUM (PORCINE) 1000 UNIT/ML IJ SOLN
INTRAMUSCULAR | Status: DC | PRN
  Administered 2011-12-28: 50000 [IU] via INTRAVENOUS

## 2011-12-28 MED ORDER — DOPAMINE-DEXTROSE 3.2-5 MG/ML-% IV SOLN: 0.0000 ug/kg/min | INTRAVENOUS | Status: DC

## 2011-12-28 MED ORDER — SODIUM CHLORIDE 0.9 % IV SOLN: 250.0000 mL | INTRAVENOUS | Status: DC

## 2011-12-28 MED ORDER — SODIUM CHLORIDE 0.9 % IV SOLN: INTRAVENOUS | Status: DC

## 2011-12-28 MED ORDER — ACETAMINOPHEN 160 MG/5ML PO SOLN: 650.0000 mg | ORAL | Status: AC

## 2011-12-28 MED ORDER — SODIUM CHLORIDE 0.9 % IV SOLN
0.4000 ug/kg/h | INTRAVENOUS | Status: DC
  Filled 2011-12-28: qty 4

## 2011-12-28 MED ORDER — LACTATED RINGERS IV SOLN: INTRAVENOUS | Status: DC

## 2011-12-28 MED ORDER — PHENYLEPHRINE HCL 10 MG/ML IJ SOLN
0.0000 ug/min | INTRAVENOUS | Status: DC
  Filled 2011-12-28: qty 2

## 2011-12-28 MED ORDER — ALBUMIN HUMAN 5 % IV SOLN: 250.0000 mL | INTRAVENOUS | Status: AC | PRN

## 2011-12-28 MED ORDER — ROCURONIUM BROMIDE 100 MG/10ML IV SOLN
INTRAVENOUS | Status: DC | PRN
  Administered 2011-12-28: 10 mg via INTRAVENOUS
  Administered 2011-12-28: 20 mg via INTRAVENOUS
  Administered 2011-12-28: 30 mg via INTRAVENOUS
  Administered 2011-12-28: 40 mg via INTRAVENOUS

## 2011-12-28 MED ORDER — FAMOTIDINE IN NACL 20-0.9 MG/50ML-% IV SOLN
20.0000 mg | Freq: Two times a day (BID) | INTRAVENOUS | Status: DC
  Administered 2011-12-28: 20 mg via INTRAVENOUS

## 2011-12-28 MED ORDER — HEMOSTATIC AGENTS (NO CHARGE) OPTIME
TOPICAL | Status: DC | PRN
  Administered 2011-12-28: 3 via TOPICAL

## 2011-12-28 MED ORDER — DEXTROSE 5 % IV SOLN
1.5000 g | Freq: Two times a day (BID) | INTRAVENOUS | Status: AC
  Administered 2011-12-28 – 2011-12-30 (×4): 1.5 g via INTRAVENOUS
  Filled 2011-12-28 (×4): qty 1.5

## 2011-12-28 MED ORDER — MILRINONE IN DEXTROSE 200-5 MCG/ML-% IV SOLN
0.1250 ug/kg/min | INTRAVENOUS | Status: DC
  Filled 2011-12-28: qty 100

## 2011-12-28 MED ORDER — MAGNESIUM SULFATE 40 MG/ML IJ SOLN
4.0000 g | Freq: Once | INTRAMUSCULAR | Status: AC
  Administered 2011-12-28: 4 g via INTRAVENOUS

## 2011-12-28 MED ORDER — ACETAMINOPHEN 650 MG RE SUPP
650.0000 mg | RECTAL | Status: AC
  Administered 2011-12-28: 650 mg via RECTAL

## 2011-12-28 MED ORDER — SODIUM CHLORIDE 0.9 % IV SOLN
0.1000 ug/kg/h | INTRAVENOUS | Status: DC
  Filled 2011-12-28: qty 2

## 2011-12-28 MED ORDER — CALCIUM CHLORIDE 10 % IV SOLN
1.0000 g | Freq: Once | INTRAVENOUS | Status: AC | PRN
  Filled 2011-12-28: qty 10

## 2011-12-28 MED ORDER — MORPHINE SULFATE 2 MG/ML IJ SOLN
1.0000 mg | INTRAMUSCULAR | Status: AC | PRN
  Administered 2011-12-28: 1 mg via INTRAVENOUS

## 2011-12-28 MED ORDER — BISACODYL 10 MG RE SUPP
10.0000 mg | Freq: Every day | RECTAL | Status: DC
  Filled 2011-12-28: qty 1

## 2011-12-28 MED ORDER — ONDANSETRON HCL 4 MG/2ML IJ SOLN
4.0000 mg | Freq: Four times a day (QID) | INTRAMUSCULAR | Status: DC | PRN
  Administered 2011-12-29 – 2011-12-30 (×3): 4 mg via INTRAVENOUS
  Filled 2011-12-28 (×3): qty 2

## 2011-12-28 MED ORDER — LACTATED RINGERS IV SOLN
INTRAVENOUS | Status: DC | PRN
  Administered 2011-12-28 (×4): via INTRAVENOUS

## 2011-12-28 MED ORDER — PANTOPRAZOLE SODIUM 40 MG PO TBEC
40.0000 mg | DELAYED_RELEASE_TABLET | Freq: Every day | ORAL | Status: DC
  Administered 2011-12-30: 40 mg via ORAL
  Filled 2011-12-28: qty 1

## 2011-12-28 MED ORDER — ACETAMINOPHEN 500 MG PO TABS
1000.0000 mg | ORAL_TABLET | Freq: Four times a day (QID) | ORAL | Status: DC
  Administered 2011-12-29 – 2011-12-30 (×6): 1000 mg via ORAL
  Filled 2011-12-28 (×9): qty 2
  Filled 2011-12-28: qty 1

## 2011-12-28 MED ORDER — VECURONIUM BROMIDE 10 MG IV SOLR
INTRAVENOUS | Status: DC | PRN
  Administered 2011-12-28: 3 mg via INTRAVENOUS
  Administered 2011-12-28: 10 mg via INTRAVENOUS
  Administered 2011-12-28: 3 mg via INTRAVENOUS
  Administered 2011-12-28 (×3): 5 mg via INTRAVENOUS
  Administered 2011-12-28 (×2): 2 mg via INTRAVENOUS
  Administered 2011-12-28: 5 mg via INTRAVENOUS

## 2011-12-28 MED ORDER — NITROGLYCERIN IN D5W 200-5 MCG/ML-% IV SOLN: 0.0000 ug/min | INTRAVENOUS | Status: DC

## 2011-12-28 MED ORDER — FENTANYL CITRATE 0.05 MG/ML IJ SOLN
INTRAMUSCULAR | Status: DC | PRN
  Administered 2011-12-28: 100 ug via INTRAVENOUS
  Administered 2011-12-28: 1000 ug via INTRAVENOUS
  Administered 2011-12-28: 150 ug via INTRAVENOUS
  Administered 2011-12-28 (×2): 250 ug via INTRAVENOUS
  Administered 2011-12-28: 50 ug via INTRAVENOUS
  Administered 2011-12-28 (×2): 100 ug via INTRAVENOUS
  Administered 2011-12-28: 250 ug via INTRAVENOUS

## 2011-12-28 MED ORDER — SODIUM CHLORIDE 0.9 % IV SOLN
INTRAVENOUS | Status: DC | PRN
  Administered 2011-12-28 (×2): via INTRAVENOUS

## 2011-12-28 MED ORDER — ACETAMINOPHEN 160 MG/5ML PO SOLN
975.0000 mg | Freq: Four times a day (QID) | ORAL | Status: DC
  Filled 2011-12-28: qty 40.6

## 2011-12-28 MED ORDER — INSULIN REGULAR BOLUS VIA INFUSION
0.0000 [IU] | Freq: Three times a day (TID) | INTRAVENOUS | Status: DC
  Filled 2011-12-28: qty 10

## 2011-12-28 MED ORDER — ACETAMINOPHEN 500 MG PO TABS: 1000.0000 mg | ORAL_TABLET | Freq: Four times a day (QID) | ORAL | Status: DC

## 2011-12-28 MED ORDER — METOPROLOL TARTRATE 12.5 MG HALF TABLET
12.5000 mg | ORAL_TABLET | Freq: Two times a day (BID) | ORAL | Status: DC
  Filled 2011-12-28 (×3): qty 1

## 2011-12-28 MED ORDER — SODIUM CHLORIDE 0.9 % IJ SOLN
3.0000 mL | Freq: Two times a day (BID) | INTRAMUSCULAR | Status: DC
  Administered 2011-12-29 – 2011-12-30 (×3): 3 mL via INTRAVENOUS

## 2011-12-28 MED ORDER — VANCOMYCIN HCL IN DEXTROSE 1-5 GM/200ML-% IV SOLN
1000.0000 mg | Freq: Once | INTRAVENOUS | Status: AC
  Administered 2011-12-29: 1000 mg via INTRAVENOUS
  Filled 2011-12-28: qty 200

## 2011-12-28 MED ORDER — MIDAZOLAM HCL 2 MG/2ML IJ SOLN: 2.0000 mg | INTRAMUSCULAR | Status: DC | PRN

## 2011-12-28 MED ORDER — MIDAZOLAM HCL 5 MG/5ML IJ SOLN
INTRAMUSCULAR | Status: DC | PRN
  Administered 2011-12-28: 2 mg via INTRAVENOUS
  Administered 2011-12-28: 4 mg via INTRAVENOUS
  Administered 2011-12-28: 5 mg via INTRAVENOUS
  Administered 2011-12-28: 4 mg via INTRAVENOUS
  Administered 2011-12-28: 2 mg via INTRAVENOUS

## 2011-12-28 MED ORDER — HEMOSTATIC AGENTS (NO CHARGE) OPTIME
TOPICAL | Status: DC | PRN
  Administered 2011-12-28: 1 via TOPICAL

## 2011-12-28 MED ORDER — OXYCODONE HCL 5 MG PO TABS
5.0000 mg | ORAL_TABLET | ORAL | Status: DC | PRN
  Administered 2011-12-29 – 2011-12-30 (×3): 5 mg via ORAL
  Filled 2011-12-28 (×3): qty 1

## 2011-12-28 MED ORDER — POTASSIUM CHLORIDE 10 MEQ/50ML IV SOLN
10.0000 meq | INTRAVENOUS | Status: AC
  Administered 2011-12-28 – 2011-12-29 (×3): 10 meq via INTRAVENOUS
  Filled 2011-12-28 (×2): qty 50

## 2011-12-28 MED ORDER — SUCCINYLCHOLINE CHLORIDE 20 MG/ML IJ SOLN
INTRAMUSCULAR | Status: DC | PRN
  Administered 2011-12-28: 140 mg via INTRAVENOUS

## 2011-12-28 MED ORDER — TRANEXAMIC ACID 100 MG/ML IV SOLN
1.5000 mg/kg/h | INTRAVENOUS | Status: DC
  Filled 2011-12-28: qty 25

## 2011-12-28 MED ORDER — ASPIRIN EC 325 MG PO TBEC
325.0000 mg | DELAYED_RELEASE_TABLET | Freq: Every day | ORAL | Status: DC
  Administered 2011-12-29 – 2011-12-30 (×2): 325 mg via ORAL
  Filled 2011-12-28 (×2): qty 1

## 2011-12-28 MED ORDER — PROTAMINE SULFATE 10 MG/ML IV SOLN
INTRAVENOUS | Status: DC | PRN
  Administered 2011-12-28: 350 mg via INTRAVENOUS

## 2011-12-28 MED ORDER — METOPROLOL TARTRATE 1 MG/ML IV SOLN: 2.5000 mg | INTRAVENOUS | Status: DC | PRN

## 2011-12-28 MED ORDER — ACETAMINOPHEN 160 MG/5ML PO SOLN: 975.0000 mg | Freq: Four times a day (QID) | ORAL | Status: DC

## 2011-12-28 MED ORDER — METOPROLOL TARTRATE 25 MG/10 ML ORAL SUSPENSION
12.5000 mg | Freq: Two times a day (BID) | ORAL | Status: DC
  Administered 2011-12-28: 12.5 mg
  Filled 2011-12-28 (×3): qty 5

## 2011-12-28 MED ORDER — ASPIRIN 81 MG PO CHEW: 324.0000 mg | CHEWABLE_TABLET | Freq: Every day | ORAL | Status: DC

## 2011-12-28 MED ORDER — MILRINONE IN DEXTROSE 200-5 MCG/ML-% IV SOLN
0.3000 ug/kg/min | INTRAVENOUS | Status: DC
  Administered 2011-12-28 – 2011-12-29 (×3): 0.3 ug/kg/min via INTRAVENOUS
  Filled 2011-12-28 (×2): qty 100

## 2011-12-28 MED ORDER — DOCUSATE SODIUM 100 MG PO CAPS
200.0000 mg | ORAL_CAPSULE | Freq: Every day | ORAL | Status: DC
  Administered 2011-12-29 – 2011-12-30 (×2): 200 mg via ORAL
  Filled 2011-12-28 (×2): qty 2

## 2011-12-28 MED ORDER — PROPOFOL 10 MG/ML IV EMUL
INTRAVENOUS | Status: DC | PRN
  Administered 2011-12-28: 50 mg via INTRAVENOUS

## 2011-12-28 MED ORDER — SODIUM CHLORIDE 0.9 % IV SOLN
0.4000 ug/kg/h | INTRAVENOUS | Status: DC
  Filled 2011-12-28: qty 2

## 2011-12-28 MED ORDER — SODIUM CHLORIDE 0.45 % IV SOLN: INTRAVENOUS | Status: DC

## 2011-12-28 MED ORDER — SODIUM CHLORIDE 0.9 % IV SOLN
INTRAVENOUS | Status: DC
  Administered 2011-12-28: 23:00:00 via INTRAVENOUS
  Filled 2011-12-28: qty 1

## 2011-12-28 MED ORDER — BISACODYL 5 MG PO TBEC
10.0000 mg | DELAYED_RELEASE_TABLET | Freq: Every day | ORAL | Status: DC
  Administered 2011-12-30: 10 mg via ORAL
  Filled 2011-12-28: qty 2

## 2011-12-28 MED ORDER — MAGNESIUM SULFATE 40 MG/ML IJ SOLN
INTRAMUSCULAR | Status: AC
  Filled 2011-12-28: qty 100

## 2011-12-28 MED ORDER — POTASSIUM CHLORIDE 10 MEQ/50ML IV SOLN
10.0000 meq | INTRAVENOUS | Status: AC
  Administered 2011-12-28 (×2): 10 meq via INTRAVENOUS

## 2011-12-28 MED ORDER — DEXTROSE 5 % IV SOLN
INTRAVENOUS | Status: DC | PRN
  Administered 2011-12-28 (×2): via INTRAVENOUS

## 2011-12-28 MED ORDER — MORPHINE SULFATE 2 MG/ML IJ SOLN
2.0000 mg | INTRAMUSCULAR | Status: DC | PRN
  Administered 2011-12-29 (×4): 2 mg via INTRAVENOUS
  Administered 2011-12-29: 4 mg via INTRAVENOUS
  Filled 2011-12-28 (×4): qty 1
  Filled 2011-12-28: qty 2
  Filled 2011-12-28: qty 1

## 2011-12-28 SURGICAL SUPPLY — 149 items
ADAPTER CARDIO PERF ANTE/RETRO (ADAPTER) ×2 IMPLANT
APPLICATOR TIP BIOGLUE STANDRD (MISCELLANEOUS) IMPLANT
APPLIER CLIP 9.375 MED OPEN (MISCELLANEOUS)
APPLIER CLIP 9.375 SM OPEN (CLIP) ×4
ATTRACTOMAT 16X20 MAGNETIC DRP (DRAPES) ×4 IMPLANT
BAG DECANTER FOR FLEXI CONT (MISCELLANEOUS) ×4 IMPLANT
BANDAGE ELASTIC 4 VELCRO ST LF (GAUZE/BANDAGES/DRESSINGS) ×2 IMPLANT
BANDAGE ELASTIC 6 VELCRO ST LF (GAUZE/BANDAGES/DRESSINGS) ×2 IMPLANT
BANDAGE GAUZE ELAST BULKY 4 IN (GAUZE/BANDAGES/DRESSINGS) ×2 IMPLANT
BASKET HEART (ORDER IN 25'S) (MISCELLANEOUS) ×1
BASKET HEART (ORDER IN 25S) (MISCELLANEOUS) ×1 IMPLANT
BENZOIN TINCTURE PRP APPL 2/3 (GAUZE/BANDAGES/DRESSINGS) ×2 IMPLANT
BLADE STERNUM SYSTEM 6 (BLADE) ×4 IMPLANT
BLADE SURG 11 STRL SS (BLADE) ×6 IMPLANT
BLADE SURG ROTATE 9660 (MISCELLANEOUS) ×4 IMPLANT
CANISTER SUCTION 2500CC (MISCELLANEOUS) ×4 IMPLANT
CANNULA GUNDRY RCSP 15FR (MISCELLANEOUS) ×2 IMPLANT
CANNULA SOFTFLOW AORTIC 8M24FR (CANNULA) ×2 IMPLANT
CATH CPB KIT OWEN (MISCELLANEOUS) ×4 IMPLANT
CATH HEART VENT LEFT (CATHETERS) ×1 IMPLANT
CATH ROBINSON RED A/P 18FR (CATHETERS) ×10 IMPLANT
CATH THORACIC 28FR (CATHETERS) IMPLANT
CATH THORACIC 28FR RT ANG (CATHETERS) IMPLANT
CATH THORACIC 36FR (CATHETERS) ×2 IMPLANT
CATH THORACIC 36FR RT ANG (CATHETERS) ×4 IMPLANT
CAUTERY EYE LOW TEMP 1300F FIN (OPHTHALMIC RELATED) ×2 IMPLANT
CLIP APPLIE 9.375 MED OPEN (MISCELLANEOUS) IMPLANT
CLIP APPLIE 9.375 SM OPEN (CLIP) ×2 IMPLANT
CLIP FOGARTY SPRING 6M (CLIP) IMPLANT
CLIP TI MEDIUM 24 (CLIP) IMPLANT
CLIP TI WIDE RED SMALL 24 (CLIP) IMPLANT
CLOTH BEACON ORANGE TIMEOUT ST (SAFETY) ×4 IMPLANT
CONN 1/2X1/2X1/2  BEN (MISCELLANEOUS) ×1
CONN 1/2X1/2X1/2 BEN (MISCELLANEOUS) ×1 IMPLANT
CONN 3/8X1/2 ST GISH (MISCELLANEOUS) ×4 IMPLANT
CONN Y 3/8X3/8X3/8  BEN (MISCELLANEOUS)
CONN Y 3/8X3/8X3/8 BEN (MISCELLANEOUS) IMPLANT
COVER MAYO STAND STRL (DRAPES) ×2 IMPLANT
COVER SURGICAL LIGHT HANDLE (MISCELLANEOUS) ×8 IMPLANT
CRADLE DONUT ADULT HEAD (MISCELLANEOUS) ×4 IMPLANT
DERMABOND ADVANCED (GAUZE/BANDAGES/DRESSINGS) ×1
DERMABOND ADVANCED .7 DNX12 (GAUZE/BANDAGES/DRESSINGS) ×1 IMPLANT
DRAIN CHANNEL 32F RND 10.7 FF (WOUND CARE) ×2 IMPLANT
DRAPE CARDIOVASCULAR INCISE (DRAPES) ×1
DRAPE INCISE IOBAN 66X45 STRL (DRAPES) ×6 IMPLANT
DRAPE SLUSH/WARMER DISC (DRAPES) ×4 IMPLANT
DRAPE SRG 135X102X78XABS (DRAPES) ×1 IMPLANT
DRSG COVADERM 4X14 (GAUZE/BANDAGES/DRESSINGS) ×2 IMPLANT
ELECT REM PT RETURN 9FT ADLT (ELECTROSURGICAL) ×8
ELECTRODE REM PT RTRN 9FT ADLT (ELECTROSURGICAL) ×4 IMPLANT
GLOVE BIO SURGEON STRL SZ 6 (GLOVE) IMPLANT
GLOVE BIO SURGEON STRL SZ 6.5 (GLOVE) IMPLANT
GLOVE BIO SURGEON STRL SZ7 (GLOVE) IMPLANT
GLOVE BIO SURGEON STRL SZ7.5 (GLOVE) IMPLANT
GLOVE BIOGEL PI IND STRL 6 (GLOVE) IMPLANT
GLOVE BIOGEL PI IND STRL 6.5 (GLOVE) IMPLANT
GLOVE BIOGEL PI IND STRL 7.0 (GLOVE) IMPLANT
GLOVE BIOGEL PI INDICATOR 6 (GLOVE)
GLOVE BIOGEL PI INDICATOR 6.5 (GLOVE)
GLOVE BIOGEL PI INDICATOR 7.0 (GLOVE)
GLOVE EUDERMIC 7 POWDERFREE (GLOVE) IMPLANT
GLOVE ORTHO TXT STRL SZ7.5 (GLOVE) ×10 IMPLANT
GOWN STRL NON-REIN LRG LVL3 (GOWN DISPOSABLE) ×16 IMPLANT
GRAFT GELWEAVE VALSALVA 28CM (Prosthesis & Implant Heart) ×4 IMPLANT
HEMOSTAT POWDER SURGIFOAM 1G (HEMOSTASIS) ×12 IMPLANT
INSERT FOGARTY 61MM (MISCELLANEOUS) IMPLANT
INSERT FOGARTY XLG (MISCELLANEOUS) ×4 IMPLANT
KIT BASIN OR (CUSTOM PROCEDURE TRAY) ×4 IMPLANT
KIT ROOM TURNOVER OR (KITS) ×4 IMPLANT
KIT SUCTION CATH 14FR (SUCTIONS) ×4 IMPLANT
KIT VASOVIEW W/TROCAR VH 2000 (KITS) ×2 IMPLANT
LEAD PACING MYOCARDI (MISCELLANEOUS) ×4 IMPLANT
LINE VENT (MISCELLANEOUS) ×2 IMPLANT
MARKER GRAFT CORONARY BYPASS (MISCELLANEOUS) ×6 IMPLANT
NDL SUT 4 .5 CRC FRENCH EYE (NEEDLE) ×1 IMPLANT
NEEDLE FRENCH EYE (NEEDLE) ×1
NS IRRIG 1000ML POUR BTL (IV SOLUTION) ×20 IMPLANT
PACK OPEN HEART (CUSTOM PROCEDURE TRAY) ×4 IMPLANT
PAD ARMBOARD 7.5X6 YLW CONV (MISCELLANEOUS) ×6 IMPLANT
PENCIL BUTTON HOLSTER BLD 10FT (ELECTRODE) ×2 IMPLANT
PUNCH AORTIC ROTATE 4.0MM (MISCELLANEOUS) IMPLANT
PUNCH AORTIC ROTATE 4.5MM 8IN (MISCELLANEOUS) IMPLANT
PUNCH AORTIC ROTATE 5MM 8IN (MISCELLANEOUS) IMPLANT
SEALANT SURG COSEAL 8ML (VASCULAR PRODUCTS) ×2 IMPLANT
SET CARDIOPLEGIA MPS 5001102 (MISCELLANEOUS) ×2 IMPLANT
SET IRRIG TUBING LAPAROSCOPIC (IRRIGATION / IRRIGATOR) ×2 IMPLANT
SOLUTION ANTI FOG 6CC (MISCELLANEOUS) ×2 IMPLANT
SPONGE GAUZE 4X4 12PLY (GAUZE/BANDAGES/DRESSINGS) ×4 IMPLANT
SPONGE LAP 18X18 X RAY DECT (DISPOSABLE) IMPLANT
SPONGE LAP 4X18 X RAY DECT (DISPOSABLE) ×2 IMPLANT
STOPCOCK 4 WAY LG BORE MALE ST (IV SETS) ×2 IMPLANT
SUT BONE WAX W31G (SUTURE) ×4 IMPLANT
SUT ETHIBON 2 0 V 52N 30 (SUTURE) ×6 IMPLANT
SUT ETHIBON EXCEL 2-0 V-5 (SUTURE) IMPLANT
SUT ETHIBOND 2 0 SH (SUTURE) ×8 IMPLANT
SUT ETHIBOND 2 0 SH 36X2 (SUTURE) IMPLANT
SUT ETHIBOND 2 0 V4 (SUTURE) IMPLANT
SUT ETHIBOND 2 0V4 GREEN (SUTURE) IMPLANT
SUT ETHIBOND 4 0 RB 1 (SUTURE) IMPLANT
SUT ETHIBOND NAB MH 2-0 36IN (SUTURE) ×4 IMPLANT
SUT ETHIBOND V-5 VALVE (SUTURE) IMPLANT
SUT ETHIBOND X763 2 0 SH 1 (SUTURE) ×12 IMPLANT
SUT MNCRL AB 3-0 PS2 18 (SUTURE) ×8 IMPLANT
SUT MNCRL AB 4-0 PS2 18 (SUTURE) ×2 IMPLANT
SUT PDS AB 1 CTX 36 (SUTURE) ×8 IMPLANT
SUT PROLENE 2 0 SH DA (SUTURE) IMPLANT
SUT PROLENE 3 0 SH DA (SUTURE) ×2 IMPLANT
SUT PROLENE 3 0 SH1 36 (SUTURE) ×6 IMPLANT
SUT PROLENE 4 0 RB 1 (SUTURE) ×17
SUT PROLENE 4 0 SH DA (SUTURE) ×2 IMPLANT
SUT PROLENE 4-0 RB1 .5 CRCL 36 (SUTURE) ×17 IMPLANT
SUT PROLENE 5 0 C 1 36 (SUTURE) ×12 IMPLANT
SUT PROLENE 6 0 C 1 30 (SUTURE) ×2 IMPLANT
SUT PROLENE 7.0 RB 3 (SUTURE) ×6 IMPLANT
SUT PROLENE 8 0 BV175 6 (SUTURE) IMPLANT
SUT PROLENE BLUE 7 0 (SUTURE) ×2 IMPLANT
SUT PROLENE POLY MONO (SUTURE) ×6 IMPLANT
SUT SILK  1 MH (SUTURE) ×2
SUT SILK 1 MH (SUTURE) ×2 IMPLANT
SUT SILK 2 0 SH CR/8 (SUTURE) IMPLANT
SUT SILK 3 0 SH CR/8 (SUTURE) IMPLANT
SUT STEEL 6MS V (SUTURE) IMPLANT
SUT STEEL STERNAL CCS#1 18IN (SUTURE) IMPLANT
SUT STEEL SZ 6 DBL 3X14 BALL (SUTURE) IMPLANT
SUT VIC AB 1 CTX 18 (SUTURE) ×2 IMPLANT
SUT VIC AB 1 CTX 36 (SUTURE) ×1
SUT VIC AB 1 CTX36XBRD ANBCTR (SUTURE) ×1 IMPLANT
SUT VIC AB 2-0 CT1 27 (SUTURE) ×1
SUT VIC AB 2-0 CT1 TAPERPNT 27 (SUTURE) ×1 IMPLANT
SUT VIC AB 2-0 CTX 27 (SUTURE) ×2 IMPLANT
SUT VIC AB 2-0 CTX 36 (SUTURE) ×2 IMPLANT
SUT VIC AB 3-0 SH 27 (SUTURE)
SUT VIC AB 3-0 SH 27X BRD (SUTURE) IMPLANT
SUT VIC AB 3-0 X1 27 (SUTURE) IMPLANT
SUT VICRYL 4-0 PS2 18IN ABS (SUTURE) IMPLANT
SUTURE E-PAK OPEN HEART (SUTURE) ×4 IMPLANT
SYR 10ML KIT SKIN ADHESIVE (MISCELLANEOUS) IMPLANT
SYSTEM SAHARA CHEST DRAIN ATS (WOUND CARE) ×4 IMPLANT
TOWEL OR 17X24 6PK STRL BLUE (TOWEL DISPOSABLE) ×6 IMPLANT
TOWEL OR 17X26 10 PK STRL BLUE (TOWEL DISPOSABLE) ×8 IMPLANT
TRAY FOLEY BAG SILVER LF 14FR (CATHETERS) ×2 IMPLANT
TRAY FOLEY IC TEMP SENS 14FR (CATHETERS) ×4 IMPLANT
TUBE SUCT INTRACARD DLP 20F (MISCELLANEOUS) ×4 IMPLANT
TUBING INSUFFLATION 10FT LAP (TUBING) ×2 IMPLANT
UNDERPAD 30X30 INCONTINENT (UNDERPADS AND DIAPERS) ×4 IMPLANT
VALVE AORTIC MAGNA 25MM (Prosthesis & Implant Heart) ×2 IMPLANT
VENT LEFT HEART 12002 (CATHETERS) ×2
WATER STERILE IRR 1000ML POUR (IV SOLUTION) ×8 IMPLANT
YANKAUER SUCT BULB TIP NO VENT (SUCTIONS) ×2 IMPLANT

## 2011-12-28 NOTE — Progress Notes (Signed)
Patient ID: David Parsons, male   DOB: 06-13-1941, 71 y.o.   MRN: [id number]                   [address]            [name] [address]          [phone]     Day of Surgery Procedure(s) (LRB): CORONARY ARTERY BYPASS GRAFTING (CABG) (N/A) BENTALL PROCEDURE (N/A)  Total Length of Stay:  LOS: 0 days  BP 101/47  Pulse 81  Temp(Src) 97.3 F (36.3 C) (Core (Comment))  Resp 12  Ht 6\' 1"  (1.854 m)  Wt 344 lb (156.037 kg)  BMI 45.39 kg/m2  SpO2 100%     . sodium chloride Stopped (12/28/11 2000)  . sodium chloride 20 mL/hr at 12/28/11 2000  . sodium chloride    . dexmedetomidine (PRECEDEX) IV infusion Stopped (12/28/11 2000)  . DOPamine 3 mcg/kg/min (12/28/11 2000)  . insulin (NOVOLIN-R) infusion 5.5 Units/hr (12/28/11 2000)  . lactated ringers 20 mL/hr at 12/28/11 1900  . milrinone 0.3 mcg/kg/min (12/28/11 2000)  . nitroGLYCERIN    . phenylephrine (NEO-SYNEPHRINE) Adult infusion 20 mcg/min (12/28/11 2000)     Lab Results  Component Value Date   WBC 20.5* 12/28/2011   HGB 12.2* 12/28/2011   HCT 36.0* 12/28/2011   PLT 200 12/28/2011   GLUCOSE 143* 12/28/2011   CHOL 123 05/15/2011   TRIG 64 05/15/2011   HDL 33* 05/15/2011   LDLCALC 77 05/15/2011   ALT 11 12/26/2011   AST 16 12/26/2011   NA 142 12/28/2011   K 3.6 12/28/2011   CL 104 12/26/2011   CREATININE 0.87 12/26/2011   BUN 13 12/26/2011   CO2 25 12/26/2011   TSH 1.739 09/07/2010   PSA 7.32* 09/07/2010   INR 1.20 12/28/2011   HGBA1C 5.8* 12/26/2011   Remains sedated on vent, fio2 on 70 % Not bleeding  Delight Ovens MD  Beeper [phone] Office [phone] 12/28/2011 8:57 PM

## 2011-12-28 NOTE — Op Note (Addendum)
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  CT angiogram demonstrates significant interval increase in size of the aorta over the past year.  Recent echocardiogram suggests no interval increase in the severity of aortic insufficiency nor change in LV chamber size or systolic function.  He remains completely asymptomatic at this time. Comorbid medical problems include long-standing hypertension, morbid obesity, obstructive sleep apnea, congestive heart failure, and pulmonary hypertension.  Options include continued close observation with careful surveillance versus proceeding with elective surgical intervention. Given the patient's long-standing problems with severe hypertension and further  increase size of his aortic root, I'm concerned about the risk of acute aortic dissection.   He denies any shortness of breath either with activity or at rest. He has not had any chest pain or back pain which could in any way be related to his aorta. He denies any exertional chest discomfort. He has not had any palpitations, dizzy spells, nor syncope. He has chronic bilateral lower extremity edema which is stable. The remainder of his review of systems is unremarkable.  The patient now presents for elective surgical repair due to the significant increase in size of his aneurysm with associated risk of acute aortic dissection or rupture.  He underwent cardiac gated CT angiogram to evaluate his proximal coronary artery anatomy and his aortic root aneurysm. Unfortunately, this scan was of limited quality and imaging of the proximal right coronary artery was completely suboptimal. In addition, this scan is reported to demonstrate some interval increase in size of the aneurysm in comparison with the previous CT angiogram from March of this year.  The patient has been seen in consultation and counseled at length regarding the indications, risks and potential benefits of surgery.  All questions have been answered, and the patient provides full informed consent for the operation as described.  Because the right coronary artery circulation had never been imaged, coronary artery bypass grafting of the right coronary artery was planned.    DETAILS OF THE OPERATIVE PROCEDURE  The patient is brought to the operating room on the above mentioned date and central monitoring was established by the anesthesia team including placement of Swan-Ganz catheter and radial arterial line. The patient is placed in the supine position on the operating table.  Intravenous antibiotics are administered. General endotracheal anesthesia is induced uneventfully. A Foley catheter is placed.  Baseline transesophageal echocardiogram was  performed.  Findings were notable for Severe aneurysmal  enlargement of the aortic root with annuloaortic ectasia. There is moderate to severe aortic insufficiency. Aortic valve is tricuspid. The aortic valve leaflets are mildly sclerotic but they seemed to open and close normally. The jet of regurgitation is central but somewhat eccentrically directed. There is normal left ventricular function. There is no sign of any patent foramen ovale. There is mild mitral regurgitation.  The patient's chest, abdomen, both groins, and both lower extremities are prepared and draped in a sterile manner. A time out procedure is performed.  A median sternotomy incision was performed.  Simultaneously, saphenous vein is obtained from the patient's right thigh using endoscopic vein harvest technique. The saphenous vein is notably good quality conduit. After removal of the saphenous vein, the small surgical incisions in the lower extremity are closed with absorbable suture.   The pericardium is opened. The aortic root is severely dilated.  The ascending aorta tapers rapidly to a more normal calibur, such that the aneurysm involves primarily just the aortic root and the most proximal portion of the ascending aorta.  The wall of the most dilated portion of the aortic root above the non-coronary sinus was notably paper thin with appearance suggestive of near imminent rupture. The ascending aorta and the right atrium are cannulated for cardioplegia bypass.  Adequate heparinization is verified.   A retrograde cardioplegia cannula is placed through the right atrium into the coronary sinus.   The entire pre-bypass portion of the operation was notable for stable hemodynamics.  Cardiopulmonary bypass was begun and a left ventricular vent placed through the right superior pulmonary vein.  The surface of the heart inspected. The distal target vessel are selected for coronary artery bypass grafting. A cardioplegia cannula is placed in the  ascending aorta.  A temperature probe was placed in the interventricular septum.  The patient is cooled to 32C systemic temperature.  The aortic cross clamp is applied just below the innominate artery and cold blood cardioplegia is delivered initially in an antegrade fashion through the aortic root.   Supplemental cardioplegia is given retrograde through the coronary sinus catheter.  Iced saline slush is applied for topical hypothermia.  The initial cardioplegic arrest is rapid with early diastolic arrest.  Repeat doses of cardioplegia are administered intermittently throughout the entire cross clamp portion of the operation through the coronary sinus catheter and through subsequently placed vein graft in order to maintain completely flat electrocardiogram and septal myocardial temperature below 15C.  Myocardial protection was felt to be excellent.  The right coronary artery was grafted using a reversed saphenous vein graft in an end-to-side fashion.  At the site of distal anastomosis the target vessel was good quality and measured approximately 2.2 mm in diameter.  The ascending aorta was transected just below the aortic cross-clamp. The aorta root aneurysm and the remainder of the ascending thoracic aorta was resected after dissecting it away from the pulmonary artery and the right ventricular outflow tract. The left main and right coronary arteries were both mobilized on an independent Correll patches. The aortic valve was carefully inspected. The aortic valve was tricuspid. Aortic valve leaflets were mildly sclerotic but seemed to open and close normally. There were no perforations and there was no prolapse. An attempt at valve sparing root replacement was felt feasible.   The remainder of the aortic root was resected after dissecting it away from the right ventricular outflow tract. Care was taken to stay in the plane between the aortic root and the right ventricular outflow tract until  this was  dissected down to the level of the aortic annulus. A small rim of aortic root wall was left circumferentially above the valve.  The aortic annulus was sized using a Hegar dilator. A 23 mm dilator easily passed.  The proximal suture line for the David I valve sparing aortic root replacement procedure (reimplantation technique) was constructed using interrupted 2-0 Ethibond pledgeted sutures with pledgets in the subannular position passing external to the aortic annulus circumferentially. A 28 mm Vascutek Gelweave Valsalva graft was chosen. The proximal end of the graft was trimmed and beveled and the proximal suture line completed. The graft was lowered into place and each of the sutures secured after taking care to make sure that the preserved aortic valve was completely surrounded by the graft.  The native aortic valve was then resuspended within the graft using running 4-0 Prolene suture to construct the distal suture line. After this was completed the valve was tested with saline. Unfortunately, the valve did not appear to be perfectly competent. The valve was symmetrically resuspended within the graft, but because of the mild sclerosis the valve leaflets the valve did not consistently close completely. The possibility of significant residual aortic insufficiency was concerning and subsequently the valve sparing root replacement was aborted.  The leaflets of the aortic valve were excised sharply. The aortic annulus was now sized through the existing aortic root graft. However, the largest valve sizer which would pass was only 21 mm. Because of the patient's large body habitus this was felt to be inadequate. The entire aortic root graft was subsequently excised and all of the pledgeted sutures removed. The aortic annulus was now sized to accept a 25 mm stented bioprosthetic tissue valve.  Aortic root replacement was performed using interrupted horizontal mattress 2-0 Ethibond pledgeted sutures with  pledgets in the supraannular position.  An [facility] Ease pericardial tissue valve (size 25 mm, model # 3300TFX, serial # [id number]) was implanted within the end of a second Vascutek Gelweave Valsalva aortic root graft (size 28 mm, catalog #[id number] ADP, serial #[id number]).  The aortic root graft was easily lowered into place and all the sutures tied.  The left main and right coronary artery buttons were reimplanted onto the Valsalva outpouchings of the aortic root graft after creating circular defects in the grafts using thermal cautery. The distal end of the graft was beveled and trimmed to an appropriate length and sewn in end-to-end fashion to the distal ascending thoracic aorta.  The single proximal vein graft anastomosis was placed directly to the ascending aortic graft prior to removal of the aortic cross clamp.  One final dose of warm retrograde "hot shot" cardioplegia was administered through the coronary sinus catheter while all air was evacuated through the aortic root.  The aortic cross clamp was removed after a total cross clamp time of 239 minutes.  The proximal and distal coronary anastomosis were inspected for hemostasis and appropriate graft orientation.  The aortic graft was also inspected for hemostasis. Epicardial pacing wires are fixed to the right ventricular outflow tract and to the right atrial appendage. The patient is rewarmed to 37C temperature. The aortic and left ventricular vents were removed.  The patient is weaned and disconnected from cardiopulmonary bypass.  The patient's rhythm at separation from bypass was AV paced.  The patient was weaned from cardioplegic bypass on low dose milrinone and dopamine infusions. Total cardiopulmonary bypass time for the operation was 289 minutes.  Followup transesophageal echocardiogram performed after separation from bypass revealed  a well-seated bioprosthetic tissue valve in the aortic position that was functioning normally. There was no  aortic insufficiency. Left ventricular function was unchanged from preoperatively. There were no other abnormalities appreciated of any significance.  The aortic and venous cannula were removed uneventfully. Protamine was administered to reverse the anticoagulation. The mediastinum and pleural space were inspected for hemostasis and irrigated with saline solution. The mediastinum was drained using 2 chest tubes placed through separate stab incisions inferiorly.  The soft tissues anterior to the aorta were reapproximated loosely. The sternum is closed with double strength sternal wire. The soft tissues anterior to the sternum were closed in multiple layers and the skin is closed with a running subcuticular skin closure.  The post-bypass portion of the operation was notable for stable rhythm and hemodynamics.  The patient received 2 units fresh frozen plasma and 2 packs of adult platelets following reversal of heparin with protamine due to coagulopathy.  The patient tolerated the procedure well and is transported to the surgical intensive care in stable condition. There are no intraoperative complications. All sponge instrument and needle counts are verified correct at completion of the operation.   Salvatore Decent. Cornelius Moras MD 12/28/2011 5:17 PM

## 2011-12-28 NOTE — Anesthesia Procedure Notes (Signed)
Procedure Name: Intubation Date/Time: 12/28/2011 8:03 AM Performed by: Julianne Rice K Pre-anesthesia Checklist: Emergency Drugs available, Patient identified, Timeout performed, Suction available and Patient being monitored Patient Re-evaluated:Patient Re-evaluated prior to inductionOxygen Delivery Method: Circle system utilized Preoxygenation: Pre-oxygenation with 100% oxygen Intubation Type: IV induction Ventilation: Mask ventilation without difficulty Laryngoscope Size: Mac and 4 Grade View: Grade IV Number of attempts: 1 Airway Equipment and Method: Stylet and Video-laryngoscopy Placement Confirmation: ETT inserted through vocal cords under direct vision,  positive ETCO2 and breath sounds checked- equal and bilateral Secured at: 24 cm Tube secured with: Tape Dental Injury: Teeth and Oropharynx as per pre-operative assessment

## 2011-12-28 NOTE — Transfer of Care (Signed)
Immediate Anesthesia Transfer of Care Note  Patient: David Parsons  Procedure(s) Performed: Procedure(s) (LRB): CORONARY ARTERY BYPASS GRAFTING (CABG) (N/A) BENTALL PROCEDURE (N/A)  Patient Location: SICU  Anesthesia Type: General  Level of Consciousness: sedated, responds to stimulation and Patient remains intubated per anesthesia plan  Airway & Oxygen Therapy: Patient remains intubated per anesthesia plan and Patient placed on Ventilator (see vital sign flow sheet for setting)  Post-op Assessment: Report given to SICU RN,KELSEY,RN.  Post vital signs: Reviewed and stable  Complications: No apparent anesthesia complications

## 2011-12-28 NOTE — OR Nursing (Signed)
15:30 - call SICU with 1st call, paged David Parsons to inform family off pump.  2nd call to SICU @ 16:00pm

## 2011-12-28 NOTE — H&P (Signed)
CARDIOTHORACIC SURGERY HISTORY AND PHYSICAL EXAM  PCP is Letitia Libra, Ala Dach, MD, MD Referring Provider is Verne Carrow, MD    Chief Complaint   Patient presents with   .  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  CT angiogram demonstrates significant interval increase in size of the aorta over the past year.  Recent echocardiogram suggests no interval increase in the severity of aortic insufficiency nor change in LV chamber size or systolic function.  He remains completely asymptomatic at this time. Comorbid medical problems include long-standing hypertension, morbid obesity, obstructive sleep apnea, congestive heart failure, and pulmonary hypertension.  Options include continued close observation with careful surveillance versus proceeding with elective surgical intervention. Given the patient's long-standing problems with severe hypertension and further increase size of his aortic root, I'm concerned about the risk of acute aortic dissection.   He denies any shortness of breath either with activity or at rest. He has not had any chest pain or back pain which could in any way be related to his aorta. He denies any exertional chest discomfort. He has not had any palpitations, dizzy spells, nor syncope. He has chronic bilateral lower extremity edema which is stable. The remainder of his review of systems is unremarkable.  The patient now presents for elective surgical repair due to the significant increase in size of his aneurysm with associated risk of acute aortic dissection or rupture.  He underwent cardiac gated CT angiogram to evaluate his proximal coronary artery anatomy and his  aortic root aneurysm. Unfortunately, this scan was of limited quality and imaging of the proximal right coronary artery was completely suboptimal. In addition, this scan is reported to demonstrate some interval increase in size of the aneurysm in comparison with the previous CT angiogram from March of this year.   Past Medical History  Diagnosis Date  . Hypertension   . Aortic insufficiency      02/16/09 Chest CT:  The aortic root has a diameter measuring 4.6 cm, image 64 of the coronal series.  The ascending    thoracic aorta has a diameter of 3.8 cm, image 65 of the coronal series.  At the level of the aortic arch    the thoracic aorta has a maximum diameter of 3.2 cm.  The descending thoracic aorta has a maximum           diameter of 3.2 cm.  There is no evidence for aortic disse        . GERD (gastroesophageal reflux disease)   . Hypokalemia   . Helicobacter pylori gastritis     (Tx Pylera 4/10)  . Cardiomyopathy      01/2009 - Cath - nonobs. dzs.  EF 35%.  . Gallstones     s/p cholecystectomy    . Sleep apnea     occasionally wears CPAP at night  . CAD (coronary artery disease)   . Aortic root enlargement     The aortic root has a diameter measuring 4.6 cm, image 64 of the coronal series.  The ascending  thoracic aorta has a diameter of 3.8 cm, image 65 of the coronal series.  At the level  of the aortic arch   the thoracic aorta has a maximum diameter of 3.2 cm.  The descending thoracic aorta has a maximum   diameter of 3.2 cm.  There is no evidence for aortic dissection.       . Iron deficiency anemia   . CHF (congestive heart failure)   . Obesity, Class III, BMI 40-49.9 (morbid obesity)   . Heart murmur   . PONV (postoperative nausea and vomiting)     Past Surgical History  Procedure Date  . Cholecystectomy   . Adrenalectomy   . Colonoscopy w/ biopsies and polypectomy 11/04/2008    colon polyps, diverticulosis   . Upper gastrointestinal endoscopy 11/04/2008    w/bx, H pylori  gastritis  . Upper gastrointestinal endoscopy 09/25/2011  . Colonoscopy 09/25/2011  . Multiple tooth extractions   . Tee without cardioversion 11/22/2011    Procedure: TRANSESOPHAGEAL ECHOCARDIOGRAM (TEE);  Surgeon: Laurey Morale, MD;  Location: [facility] ENDOSCOPY;  Service: Cardiovascular;  Laterality: N/A;  . Cardiac catheterization     2013    Family History  Problem Relation Age of Onset  . Breast cancer Sister   . Colon cancer Brother 70    at least 70  . Prostate cancer Brother   . Diabetes Sister     5 siblings  . Stomach cancer Sister   . Hypertension Mother   . Kidney failure Sister   . Colon cancer Sister   . Breast cancer Sister   . Stroke Father     Social History History  Substance Use Topics  . Smoking status: Never Smoker   . Smokeless tobacco: Never Used  . Alcohol Use: No    Current Facility-Administered Medications  Medication Dose Route Frequency Provider Last Rate Last Dose  . cefUROXime (ZINACEF) 1.5 g in dextrose 5 % 50 mL IVPB  1.5 g Intravenous To OR Purcell Nails, MD      . cefUROXime (ZINACEF) 750 mg in dextrose 5 % 50 mL IVPB  750 mg Intravenous To OR Purcell Nails, MD      . dexmedetomidine (PRECEDEX) 400 mcg in sodium chloride 0.9 % 100 mL infusion  0.1-0.7 mcg/kg/hr Intravenous To OR Purcell Nails, MD      . DOPamine (INTROPIN) 800 mg in dextrose 5 % 250 mL infusion  2-20 mcg/kg/min Intravenous To OR Purcell Nails, MD      . EPINEPHrine (ADRENALIN) 4,000 mcg in dextrose 5 % 250 mL infusion  0.5-20 mcg/min Intravenous To OR Purcell Nails, MD      . insulin regular (NOVOLIN R,HUMULIN R) 1 Units/mL in sodium chloride 0.9 % 100 mL infusion   Intravenous To OR Purcell Nails, MD      . magnesium sulfate (IV Push/IM) injection 40 mEq  40 mEq Other To OR Purcell Nails, MD      . metoprolol tartrate (LOPRESSOR) tablet 12.5 mg  12.5 mg Oral Once Purcell Nails, MD      . nitroGLYCERIN 0.2 mg/mL in dextrose 5 % infusion  2-200 mcg/min Intravenous To  OR Purcell Nails, MD      . nitroglycerin-nicardipine-HEPARIN-sodium bicarbonate irrigation for artery spasm   Irrigation To OR Purcell Nails, MD      . phenylephrine (NEO-SYNEPHRINE) 20,000 mcg in dextrose 5 % 250 mL infusion  30-200 mcg/min Intravenous To OR Purcell Nails, MD      . potassium chloride injection 80 mEq  80 mEq Other To OR Salvatore Decent  Cornelius Moras, MD      . tranexamic acid (CYKLOKAPRON) bolus via infusion - over 30 minutes 2,340 mg  15 mg/kg Intravenous To OR Purcell Nails, MD      . tranexamic acid (CYKLOKAPRON) pump prime solution 312 mg  2 mg/kg Intracatheter To OR Purcell Nails, MD      . tranexamic acid (CYKLOLAPRON) 2,500 mg in sodium chloride 0.9 % 250 mL infusion  1.5 mg/kg/hr Intravenous To OR Purcell Nails, MD      . vancomycin (VANCOCIN) 1,500 mg in sodium chloride 0.9 % 250 mL IVPB  1,500 mg Intravenous To OR Purcell Nails, MD       Facility-Administered Medications Ordered in Other Encounters  Medication Dose Route Frequency Provider Last Rate Last Dose  . fentaNYL (SUBLIMAZE) injection    PRN Randel K Temples, CRNA   50 mcg at 12/28/11 0717  . lactated ringers infusion    Continuous PRN Randel K Temples, CRNA      . midazolam (VERSED) 5 MG/5ML injection    PRN Randel K Temples, CRNA   2 mg at 12/28/11 0708    Allergies  Allergen Reactions  . Latex Hives    Latex gloves     Review of Systems:             General:                      normal appetite, normal energy               Respiratory:                no cough, no wheezing, no hemoptysis, no pain with inspiration or cough, no shortness of breath               Cardiac:                       no chest pain or tightness, no exertional SOB, no resting SOB, no PND, no orthopnea, + chronic bilateral LE edema, no palpitations, no syncope             GI:                                no difficulty swallowing, no hematochezia, no hematemesis, no melena, no constipation, no diarrhea               GU:                               no dysuria, no urgency, no frequency               Musculoskeletal:         no arthritis, no arthralgia               Vascular:                     no pain suggestive of claudication               Neuro:                         no symptoms suggestive of TIA's, no seizures, no headaches, no peripheral neuropathy  Endocrine:                   Negative               HEENT:                       no loose teeth or painful teeth,  no recent vision changes             Psych:                         no anxiety, no depression                Physical Exam:              BP 184/76  Pulse 78  Resp 20  Ht 6\' 2"  (1.88 m)  Wt 340 lb (154.223 kg)  BMI 43.65 kg/m2  SpO2 96%             General:                      Obese but otherwise  well-appearing             HEENT:                       Unremarkable               Neck:                           no JVD, no bruits, no adenopathy               Chest:                         clear to auscultation, symmetrical breath sounds, no wheezes, no rhonchi               CV:                              RRR, grade III/VI diastolic murmur               Abdomen:                    soft, non-tender, no masses               Extremities:                 warm, well-perfused, pulses not palpable, + mild-moderate bilateral LE edema             Rectal/GU                   Deferred             Neuro:                         Grossly non-focal and symmetrical throughout             Skin:                            Clean and dry, no rashes, no breakdown  CT ANGIOGRAPHY CHEST 11/03/2011  Technique: Multidetector CT imaging of the chest using  the   standard protocol during bolus administration of intravenous   contrast. Multiplanar reconstructed images including MIPs were   obtained and reviewed to evaluate the vascular anatomy.   Contrast: 100 ml of Omnipaque-300   Comparison: Chest CTA 11/04/2010.   Findings:   Mediastinum: The ascending  aorta is dilated measuring up to 5.5 cm   (this was measured on a multiplanar reformat in a fashion that is   orthogonal to the direction of blood flow). Overall, this appears   slightly increased compared to the prior examination from 1 year   ago, despite the similar measurements reported (when measured in a   similar fashion on axial images to that measured a year ago,   today's measurement is 6.4 cm, and it was previously 5.5 cm);   however, the measurement is inaccurate on axial images and   overestimates the true size of the aorta. Heart size is mildly   enlarged. There is no significant pericardial fluid, thickening or   pericardial calcification. There is atherosclerosis of the thoracic   aorta, the great vessels of the mediastinum and the coronary   arteries, including calcified atherosclerotic plaque in the left   main, left anterior descending, circumflex and right coronary   arteries. There is a small hiatal hernia. No pathologically   enlarged mediastinal or hilar lymph nodes.   Lungs/Pleura: Assessment of lung parenchyma is limited by patient   respiratory motion. However, there is no definite consolidative   airspace disease and no definite suspicious-appearing pulmonary   nodules or masses. Trace bilateral pleural effusions layer   dependently.   Upper Abdomen: Status post cholecystectomy. Multiple surgical   clips in the right upper quadrant of the abdomen and in the right   retroperitoneum. 1 cm low attenuation lesion in segment 7,   slightly increased in size compared to the prior exam from   11/04/2010, but incompletely characterized on today's study.   Musculoskeletal: There are no aggressive appearing lytic or blastic   lesions noted in the visualized portions of the skeleton.   IMPRESSION:   1. On today's examination, the ascending thoracic aorta measures   5.5 cm in diameter. While this measurement is unchanged compared   to the prior study from 11/04/2010, the  ascending aortic aneurysm   is clearly larger than the prior exam. Differences appear related   to measurement technique, and the measurement for the ascending   thoracic aorta on today's study was measured orthogonal to the long   axis of blood flow (the recommended technique). Overall, the   aneurysm is favored to have increased greater than 0.5 cm in   diameter in the last year, and surgical consultation is likely   warranted at this time. This was discussed with Dr. Eden Emms   (covering for Dr. Clifton James) by telephone by Dr. Llana Aliment   on03/15/2013 at to 09:28 AM.   2. Mild cardiomegaly, unchanged.   3. Postsurgical changes in the upper abdomen, as above.   Original Report Authenticated By: Florencia Reasons, M.D.    Transthoracic Echocardiography  Patient: David Parsons, David Parsons MR #: [id number] Study Date: 10/31/2011 ------------------------------------------------------------ LV EF: 55% - 60% ------------------------------------------------------------ Study Conclusions  - Left ventricle: The cavity size was normal. Wall thickness was increased in a pattern of mild LVH. Systolic function was normal. The estimated ejection fraction was in the range of 55% to 60%. Wall motion was normal; there were no regional wall motion abnormalities. Doppler parameters are consistent with abnormal left ventricular relaxation (  grade 1 diastolic dysfunction). - Aortic valve: Mild regurgitation. - Aorta: The aorta was severely dilated and non-diseased. - Aortic root: The aortic root was severely dilated. - Ascending aorta: The ascending aorta was moderately dilated. - Left atrium: The atrium was mildly dilated. - Right atrium: The atrium was mildly dilated. - Impressions: Suggest serial F/U of aortic root with MRI or CT Impressions: - Suggest serial F/U of aortic root with MRI or  CT ------------------------------------------------------------ ------------------------------------------------------------ Left ventricle: The cavity size was normal. Wall thickness was increased in a pattern of mild LVH. Systolic function was normal. The estimated ejection fraction was in the range of 55% to 60%. Wall motion was normal; there were no regional wall motion abnormalities. Doppler parameters are consistent with abnormal left ventricular relaxation (grade 1 diastolic dysfunction). ------------------------------------------------------------ Aortic valve: Trileaflet; mildly thickened leaflets. Doppler: There was no stenosis. Mild regurgitation. ------------------------------------------------------------ Aorta: The aorta was severely dilated and non-diseased. Aortic root: The aortic root was severely dilated. Ascending aorta: The ascending aorta was moderately dilated. ------------------------------------------------------------ Mitral valve: Structurally normal valve. Leaflet separation was normal. Doppler: Transvalvular velocity was within the normal range. There was no evidence for stenosis. No regurgitation. Peak gradient: 6mm Hg (D). ------------------------------------------------------------ Left atrium: The atrium was mildly dilated. ------------------------------------------------------------ Right ventricle: The cavity size was normal. Wall thickness was normal. Systolic function was normal. ------------------------------------------------------------ Pulmonic valve: The valve appears to be grossly normal. Doppler: No significant regurgitation. ------------------------------------------------------------ Tricuspid valve: Structurally normal valve. Leaflet separation was normal. Doppler: Transvalvular velocity was within the normal range. No regurgitation. ------------------------------------------------------------ Pulmonary artery: The main pulmonary artery  was normal-sized. Systolic pressure was within the normal range. ----------------------------------------------------------- Right atrium: The atrium was mildly dilated. ------------------------------------------------------------ Pericardium: The pericardium was normal in appearance. ------------------------------------------------------------ Systemic veins: Inferior vena cava: The vessel was normal in size; the respirophasic diameter changes were in the normal range (= 50%); findings are consistent with normal central venous pressure. ------------------------------------------------------------ Post procedure conclusions Ascending Aorta: - The aorta was severely dilated and non-diseased. ------------------------------------------------------------ Prepared and Electronically Authenticated by  Charlton Haws 2013-03-12T10:40:06.590   Transesophageal Echocardiography  Patient:    David Parsons, David Parsons MR #:       [id number] Study Date: 11/22/2011 ------------------------------------------------------------ LV EF: 55% -   60% ------------------------------------------------------------ Study Conclusions  - Left ventricle: The cavity size was normal. There was mild   concentric hypertrophy. Systolic function was normal. The   estimated ejection fraction was in the range of 55% to   60%. - Aortic valve: Trileaflet; moderately thickened leaflets.   There was no stenosis. Moderate regurgitation likely due   to annular dilatation with inadequate leaflet coaptation. - Aorta: Dilated aortic root and ascending aorta. Root 4.2   cm, ascending aorta 5.8 cm just above sinotubular   junction. I question whether the right coronary cusp is   dilated out of proportion to other cusps but it was only   seen in an off-axis view on this TEE. Could assess better   with CTA. Normal caliber descending thoracic aorta and   aortic arch. - Mitral valve: Trivial regurgitation. - Left atrium: The atrium  was mildly dilated. No evidence of   thrombus in the atrial cavity or appendage. - Right ventricle: Poorly visualized. The cavity size was   normal. Systolic function was normal. - Right atrium: No evidence of thrombus in the atrial cavity   or appendage. - Atrial septum: Weakly positive bubble study, suspect small   PFO. ------------------------------------------------------------ Left ventricle:  The cavity size was normal. There was  mild concentric hypertrophy. Systolic function was normal. The estimated ejection fraction was in the range of 55% to 60%. ------------------------------------------------------------ Aortic valve:   Trileaflet; moderately thickened leaflets. Doppler:   There was no stenosis.   Moderate regurgitation likely due to annular dilatation with inadequate leaflet coaptation.    Mean gradient: 5mm Hg (S). ------------------------------------------------------------ Aorta:  Dilated aortic root and ascending aorta. Root 4.2 cm, ascending aorta 5.8 cm just above sinotubular junction. I question whether the right coronary cusp is dilated out of proportion to other cusps but it was only seen in an off-axis view on this TEE. Could assess better with CTA. Normal caliber descending thoracic aorta and aortic arch. ------------------------------------------------------------ Mitral valve:   Doppler:   There was no evidence for stenosis.    Trivial regurgitation. ------------------------------------------------------------ Left atrium:  The atrium was mildly dilated.  No evidence of thrombus in the atrial cavity or appendage. ------------------------------------------------------------ Atrial septum:  Weakly positive bubble study, suspect small PFO. ------------------------------------------------------------ Right ventricle:  Poorly visualized. The cavity size was normal. Systolic function was  normal. ------------------------------------------------------------ Pulmonic valve:    Structurally normal valve.   Cusp separation was normal. ----------------------------------------------------------- Tricuspid valve:   Doppler:   Trivial regurgitation. ------------------------------------------------------------ Right atrium:  The atrium was normal in size.  No evidence of thrombus in the atrial cavity or appendage. ------------------------------------------------------------ Pericardium:  There was no pericardial effusion. ------------------------------------------------------------ Post procedure conclusions Ascending Aorta: - Dilated aortic root and ascending aorta. Root 4.2 cm,   ascending aorta 5.8 cm just above sinotubular junction. I   question whether the right coronary cusp is dilated out of   proportion to other cusps but it was only seen in an   off-axis view on this TEE. Could assess better with CTA.   Normal caliber descending thoracic aorta and aortic arch. ------------------------------------------------------------ Marca Ancona, MD 2013-04-03T22:09:59.537   Cardiac Catheterization Operative Report  David Parsons [id number] 4/3/20132:51 PM  Procedure Details: The risks, benefits, complications, treatment options, and expected outcomes were discussed with the patient. The patient and/or family concurred with the proposed plan, giving informed consent. The patient was brought to the cath lab after IV hydration was begun and oral premedication was given. The patient was further sedated with Versed and Fentanyl. The right groin was prepped and draped in the usual manner. Using the modified Seldinger access technique, a 5 French sheath was placed in the femoral artery. A 6 French sheath was inserted into the right femoral vein. A balloon tipped catheter was used to perform a right heart catheterization. Standard diagnostic catheters were used to perform selective  coronary angiography. Due to the patients dilated aortic root, none of our catheters selectively engaged the left main or RCA. I ultimately was able to position a XB LAD 4.5 guiding catheter in the left cusp and take non-selective injections of the left system. I tried a AL-1, AL-2, multipurpose, JL5, JL4 with no success. I was not able to engage the right coronary artery after trying with multiple catheters.  A pigtail catheter was used to perform an aortic root angiogram. There were no immediate complications. The patient was taken to the recovery area in stable condition.   This procedure took 2 hours. 30 minutes of fluoroscopy time.   Hemodynamic Findings: Ao:    135/60            LV:    151/15/36 RA:    6        RV:   44/8/10 PA:   46/27 (37)  PCWP: 19 Fick Cardiac Output: 7.33 L/min Fick Cardiac Index:  2.72 L/min/m2 Central Aortic Saturation: 98% Pulmonary Artery Saturation: 72%  Angiographic Findings:  Left main: No evidence of obstructive disease.   Left Anterior Descending Artery: Large caliber vessel that courses to the apex. No evidence of disease. There is a moderate sized diagonal branch that is free of disease.    Circumflex Artery:  Moderate sized vessel with large OM branch. No obstructive disease.    Right Coronary Artery: Unable to visualize secondary to dilatation of the aortic root causing inability to engage the vessel. No obstructive disease at time of cath in 2010.   Left Ventricular Angiogram: Not performed.   Aortic root is dilated.  Impression: 1. No obstructive CAD noted in left coronary system.   2. Inability to visualize the right coronary system. 3. Dilated aortic root  Recommendations: Will discuss with CT surgery. We may need to arrange a coronary CTA to document patency of the RCA before his surgery is planned. This will need to be delayed by at least a week with large contrast load used today.          Complications:  None; patient  tolerated the procedure well.  Operator: Verne Carrow, MD   CT ANGIOGRAPHY CHEST, ABDOMEN AND PELVIS   Technique: Multidetector CT imaging through the chest, abdomen and   pelvis was performed using the standard protocol during bolus   administration of intravenous contrast. Multiplanar reconstructed   images including MIPs were obtained and reviewed to evaluate the   vascular anatomy.   Contrast: OMNIPAQUE IOHEXOL 350 MG/ML SOLN   Comparison: 11/03/2011   CTA CHEST   Findings: There is good contrast opacification of the pulmonary   artery branches. No discrete filling defect to suggest acute   PE.The exam was not optimized for detection of pulmonary emboli.   Coronary arterial calcifications are noted.   The ascending aorta measures 5.4 cm diameter at the sinotubular   junction, 5.6 cm proximal ascending, tapering to 3.8 cm in the   distal ascending/proximal arch, distal arch 3.4 cm, proximal   descending 3.8 cm, distal descending 2.9 cm just above the   diaphragm. There is patchy calcified nonocclusive plaque in the   distal arch and descending segments. No evidence of dissection.   There is classic three-vessel brachiocephalic arterial origin   anatomy with minimal plaque in the proximal subclavian artery, no   stenosis.   No pleural or pericardial effusion. No hilar or mediastinal   adenopathy. Lungs are clear. Minimal spondylitic changes in the   lower thoracic spine.   Review of the MIP images confirms the above findings.   IMPRESSION:   1. 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   2. Patchy coronary and aortic calcifications.   CTA ABDOMEN AND PELVIS   Arterial findings:   Aorta: Scattered calcified nonocclusive plaque.   No dissection or aneurysm.   Celiac axis: Calcified ostial plaque resulting in mild   short segment stenosis of less than 1 cm, patent distally.   Superior mesenteric:Widely patent, with classic distal branching   anatomy.    Left renal: Single, with partially calcified ostial   plaque, no stenosis, patent distally.   Right renal: Single, patent.   Inferior mesenteric:Minimal calcified ostial plaque without   stenosis, patent.   Left iliac: Mildly tortuous system with scattered   calcified nonocclusive plaque, no stenosis or aneurysm.   Right iliac: Mildly tortuous system, with scattered   calcified nonocclusive  plaque, no aneurysm.   Venous findings: Dedicated venous phase imaging was not obtained.   Patency of the portal, splenic, superior mesenteric veins noted.   Review of the MIP images confirms the above findings.   Nonvascular findings: Stable sub centimeter low attenuation lesions   in the posterior right and lateral left hepatic segments.   Vascular clips in the gallbladder fossa. Unremarkable spleen,   adrenal glands. Stable small renal cysts. No hydronephrosis.   Mild pancreatic parenchymal atrophy. No mesenteric,   retroperitoneal, or pelvic adenopathy. There is marked prostatic   enlargement. Urinary bladder is incompletely distended. Stomach   and small bowel decompressed. Normal appendix. The colon is   nondilated with scattered descending and sigmoid diverticula; no   adjacent inflammatory/edematous change. No ascites. No free air.   Degenerative changes in the lower lumbar spine.   IMPRESSION:   1. Negative for abdominal aortic aneurysm or significant branch   occlusive disease.   2. Prostatic enlargement.   3. Scattered colonic diverticula.   Original Report Authenticated By: Osa Craver, M.D.  OVER-READ INTERPRETATION - CT CHEST   The following report is an over-read performed by radiologist Dr.   Florencia Reasons, M.D. of [facility] on 12/14/2011   11:50:41. This over-read does not include interpretation of   cardiac or coronary anatomy or pathology. The CTA interpretation   by the cardiologist is attached.   Comparison: Chest CT 11/03/2011.   Findings:  Again noted is an aneurysm of the proximal ascending   thoracic aorta which measures up to 6.2 cm in diameter (this has   increased compared to the prior examination, however, the prior   examination was not gated making a direct comparison is difficult).   No consolidative airspace disease, suspicious-appearing pulmonary   nodules or pleural effusions within the visualized portions of the   thorax. Visualized upper abdomen is unremarkable. There are no   aggressive appearing lytic or blastic lesions noted in the   visualized portions of the skeleton.   IMPRESSION:   1. Marked aneurysmal dilatation of the proximal descending   thoracic aorta (immediately above the level of the sinuses of   Valsalva) which appears to have increased compared to the recent   prior examination. This rapid expansion could suggest an unstable   aneurysm.   These results were called by telephone on 12/13/2011 at 12:05   p.m. to Dr. Eden Emms, who verbally acknowledged these results. These   results were also communicated to Dr. Tyrone Sage of Cardiothoracic   Surgery on 12/13/2011 at 12:25 p.m.   Cardiac CTA:   Indication: Ascending aortic aneurysm. Need for AVR with root   replacement Unable to adequately visualize coronary arteries   during catheterization   Protocol: Patient scanned on a Philips 256 with gantry rotation   speed of 270 msec and collimation of .9mm The patient was not an   ideal patient for CTA. His weight was over 350 lbs. It took over   two hours to place a suitable i.v. for contrast injection at   5cc/sec. US guidance was finally used. Despite 15mg  of i.v.   lopresser and amiodarone the patient had frequent PAC;s. We left   KV at 120 and did not use idose because of the patient size. SL   nitro was given. A retrospective protocol was used due to   arrhythmia. Following the attempt at cardiac CTA using 80cc of   contrast the patient had a non gated CTA from the  arch to mid femur   to further  assess his aortic aneurysm   Coronary Arteries: The scan was nondiagnositc. There was moderate   calcification of the distal LM and proximal LAD. The LM, proximal   LAD and proximal section of a large diagonal branch were patent.   The proximal circumflex was patent. The origin of the left main   was normal from the left cusp. The origin of the RCA was not   visualized. The proximal RCA was patent with mild calcification.   Gated Images showed left ventricular enlargement with normal LV   systolic function. EF could not be quantified due to poor cavity   opacification.   The AV was trileaflet. See dictation from Radiology regarding   severe dilatation of the sinus of valsalva area of the aorta. The   ascending aorta measures 5.4 cm diameter at the sinotubular and   5.6 cm in the proximal ascending aorta. Normal origian of the   great vessels from the aortic arch.   Impression:   1) Nondiagnostic coronary CTA due to morbid obesity and   arrhythmia. Moderate calcification of distal LM and proximal LAD.   Normal origin of LM and patent proximal LAD and first diagonal.   Mild calcification of prosimal RCA. Origin not seen. Patent   proximal RCA   2) Qualitatively normal EF on retrospective gated images with   LVE   3) Trileaflet AV with severe aneurysmal dilatation of the   ascending aorta 5.6 cm see radiology report of nongated aortic CTA.   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 I favor proceeding with valve sparing aortic root replacement in the near future. I am not entirely convinced that the aneurysm has dramatically change in size over the past month, but there is no question that it has been enlarging. Unfortunately, we still have not been able to image the right coronary artery sufficiently. Under the circumstances we could attempt repeat  cardiac catheterization, assume the absence of coronary artery disease and do nothing, or plan to perform single-vessel coronary artery bypass grafting to the right coronary system at the time of root replacement. Because of the very large root aneurysm and the need to implant the coronary artery, I think it might be wise simply to plan on coronary artery bypass grafting and not worry about trying to repeat the catheterization.  I've encouraged patient to move the date of surgery up a soon as practical given the concern is that his aneurysm may be enlarging in size.   Plan:  We plan to proceed on Thursday, Dec 28, 2011 for valve-sparing aortic root replacement and coronary artery bypass grafting with possible Bentall aortic root replacement if his native aortic valve cannot be preserved.  The patient was counseled at length regarding surgical alternatives with respect to valve replacement. In particular, discussion was held comparing in contrast and the risks of mechanical valve replacement and the need for lifelong anticoagulation versus use of a bioprosthetic tissue valve and the associated potential for late structural valve deterioration in failure. This discussion was placed in the context of the patient's particular circumstances, and as a result the patient specifically requests that their valve be replaced using a bioprosthetic tissue valve .  They understand and accept all potential associated risks of surgery including but not limited to risk of death, stroke, myocardial infarction, congestive heart failure, respiratory failure, renal  failure, pneumonia, bleeding requiring blood transfusion and or reexploration, arrhythmia, heart block or bradycardia requiring permanent pacemaker, pleural effusions or other delayed complications related to continued congestive heart failure, or other late complications related to valve replacement.  All of his questions been addressed.   Salvatore Decent. Cornelius Moras, MD

## 2011-12-28 NOTE — OR Nursing (Signed)
Second time out and chest incision with Dr Cornelius Moras @ 09:30.

## 2011-12-28 NOTE — Anesthesia Preprocedure Evaluation (Addendum)
Anesthesia Evaluation  Patient identified by MRN, date of birth, ID band Patient awake    Reviewed: Allergy & Precautions, H&P , NPO status , Patient's Chart, lab work & pertinent test results, reviewed documented beta blocker date and time   History of Anesthesia Complications (+) PONV  Airway Mallampati: II TM Distance: >3 FB Neck ROM: Full    Dental  (+) Teeth Intact and Dental Advisory Given   Pulmonary sleep apnea ,          Cardiovascular hypertension, Pt. on medications and Pt. on home beta blockers + CAD and +CHF + Valvular Problems/Murmurs AI     Neuro/Psych    GI/Hepatic GERD-  Medicated and Controlled,  Endo/Other  Morbid obesity  Renal/GU      Musculoskeletal   Abdominal (+) + obese,   Peds  Hematology   Anesthesia Other Findings   Reproductive/Obstetrics                         Anesthesia Physical Anesthesia Plan  ASA: IV  Anesthesia Plan: General   Post-op Pain Management:    Induction: Intravenous  Airway Management Planned: Oral ETT and Video Laryngoscope Planned  Additional Equipment: Arterial line, PA Cath, 3D TEE and Ultrasound Guidance Line Placement  Intra-op Plan:   Post-operative Plan: Post-operative intubation/ventilation  Informed Consent: I have reviewed the patients History and Physical, chart, labs and discussed the procedure including the risks, benefits and alternatives for the proposed anesthesia with the patient or authorized representative who has indicated his/her understanding and acceptance.   Dental advisory given  Plan Discussed with: Surgeon and CRNA  Anesthesia Plan Comments:        Anesthesia Quick Evaluation

## 2011-12-28 NOTE — Progress Notes (Signed)
Attempted cardiac rapid wean protocol and while on CP/PS patient had 2 prolonged episodes of apnea. Still feels sleepy and falls asleep easily despite being able to raise head and stick out tongue. Placed back on SIMV and will reassess shortly.

## 2011-12-28 NOTE — Progress Notes (Signed)
*  PRELIMINARY RESULTS* Echocardiogram 2D Echocardiogram has been performed.  Jorje Guild [facility] 12/28/2011, 8:35 AM

## 2011-12-28 NOTE — Preoperative (Signed)
Beta Blockers   Reason not to administer Beta Blockers:Not Applicable 

## 2011-12-28 NOTE — Brief Op Note (Signed)
12/28/2011  2:17 PM  PATIENT:  David Parsons  71 y.o. male  PRE-OPERATIVE DIAGNOSIS:  Aortic root aneurysm, coronary artery disease, aortic insufficiency  POST-OPERATIVE DIAGNOSIS:  Aortic root aneurysm, coronary artery disease, aortic insufficiency  PROCEDURE:     BENTALL PROCEDURE (N/A)   Vascutek Gelweave Valsalva Graft (size 28 mm, catalog #[id number], serial #[id number])   Edwards Magna Ease Pericardial Tissue Valve (size 25 mm, model #3300TFX, serial #[id number])   Reimplantation of coronaries    CABG x1 (SVG to RCA, EVH via right Thigh)  SURGEON:    Purcell Nails, MD  ASSISTANTS:  Lowella Dandy, PA-C  ANESTHESIA:   Aubery Lapping, MD  CROSSCLAMP TIME:   239 CARDIOPULMONARY BYPASS TIME: 289  FINDINGS:  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

## 2011-12-29 ENCOUNTER — Inpatient Hospital Stay (HOSPITAL_COMMUNITY): Payer: Medicare Other

## 2011-12-29 LAB — CBC
HCT: 35.7 % — ABNORMAL LOW (ref 39.0–52.0)
Hemoglobin: 11.3 g/dL — ABNORMAL LOW (ref 13.0–17.0)
MCH: 29.9 pg (ref 26.0–34.0)
MCV: 91.3 fL (ref 78.0–100.0)
MCV: 92.9 fL (ref 78.0–100.0)
Platelets: 197 10*3/uL (ref 150–400)
Platelets: 175 10*3/uL (ref 150–400)
RBC: 3.8 MIL/uL — ABNORMAL LOW (ref 4.22–5.81)
RDW: 13.3 % (ref 11.5–15.5)
WBC: 17.4 10*3/uL — ABNORMAL HIGH (ref 4.0–10.5)
WBC: 19.1 10*3/uL — ABNORMAL HIGH (ref 4.0–10.5)

## 2011-12-29 LAB — PREPARE PLATELET PHERESIS

## 2011-12-29 LAB — BASIC METABOLIC PANEL
BUN: 14 mg/dL (ref 6–23)
CO2: 23 mEq/L (ref 19–32)
Calcium: 8.1 mg/dL — ABNORMAL LOW (ref 8.4–10.5)
Chloride: 108 mEq/L (ref 96–112)
Creatinine, Ser: 0.77 mg/dL (ref 0.50–1.35)
GFR calc Af Amer: >90 mL/min (ref >90)

## 2011-12-29 LAB — GLUCOSE, CAPILLARY
Glucose-Capillary: 131 mg/dL — ABNORMAL HIGH (ref 70–99)
Glucose-Capillary: 141 mg/dL — ABNORMAL HIGH (ref 70–99)
Glucose-Capillary: 100 mg/dL — ABNORMAL HIGH (ref 70–99)
Glucose-Capillary: 108 mg/dL — ABNORMAL HIGH (ref 70–99)
Glucose-Capillary: 102 mg/dL — ABNORMAL HIGH (ref 70–99)
Glucose-Capillary: 109 mg/dL — ABNORMAL HIGH (ref 70–99)
Glucose-Capillary: 108 mg/dL — ABNORMAL HIGH (ref 70–99)
Glucose-Capillary: 102 mg/dL — ABNORMAL HIGH (ref 70–99)
Glucose-Capillary: 122 mg/dL — ABNORMAL HIGH (ref 70–99)
Glucose-Capillary: 131 mg/dL — ABNORMAL HIGH (ref 70–99)
Glucose-Capillary: 126 mg/dL — ABNORMAL HIGH (ref 70–99)

## 2011-12-29 LAB — POCT I-STAT 3, ART BLOOD GAS (G3+)
Bicarbonate: 24.2 mEq/L — ABNORMAL HIGH (ref 20.0–24.0)
O2 Saturation: 97 %
TCO2: 26 mmol/L (ref 0–100)
TCO2: 26 mmol/L (ref 0–100)
pCO2 arterial: 40.6 mmHg (ref 35.0–45.0)
pCO2 arterial: 43.5 mmHg (ref 35.0–45.0)
pH, Arterial: 7.354 (ref 7.350–7.450)
pO2, Arterial: 79 mmHg — ABNORMAL LOW (ref 80.0–100.0)
pO2, Arterial: 93 mmHg (ref 80.0–100.0)

## 2011-12-29 LAB — POCT I-STAT, CHEM 8
Calcium, Ion: 1.19 mmol/L (ref 1.12–1.32)
HCT: 36 % — ABNORMAL LOW (ref 39.0–52.0)
Hemoglobin: 12.2 g/dL — ABNORMAL LOW (ref 13.0–17.0)
TCO2: 26 mmol/L (ref 0–100)

## 2011-12-29 LAB — PREPARE FRESH FROZEN PLASMA: Unit division: 0

## 2011-12-29 LAB — CREATININE, SERUM
Creatinine, Ser: 1.04 mg/dL (ref 0.50–1.35)
GFR calc Af Amer: 83 mL/min — ABNORMAL LOW (ref >90)

## 2011-12-29 LAB — TYPE AND SCREEN: Antibody Screen: NEGATIVE

## 2011-12-29 LAB — MAGNESIUM: Magnesium: 2.5 mg/dL (ref 1.5–2.5)

## 2011-12-29 MED ORDER — FUROSEMIDE 10 MG/ML IJ SOLN
40.0000 mg | Freq: Four times a day (QID) | INTRAMUSCULAR | Status: AC
  Administered 2011-12-29 (×3): 40 mg via INTRAVENOUS
  Filled 2011-12-29 (×3): qty 4

## 2011-12-29 MED ORDER — INSULIN ASPART 100 UNIT/ML ~~LOC~~ SOLN
0.0000 [IU] | SUBCUTANEOUS | Status: DC
  Administered 2011-12-29 – 2011-12-30 (×6): 2 [IU] via SUBCUTANEOUS

## 2011-12-29 MED ORDER — POTASSIUM CHLORIDE 10 MEQ/50ML IV SOLN
10.0000 meq | INTRAVENOUS | Status: AC
  Administered 2011-12-29 (×4): 10 meq via INTRAVENOUS
  Filled 2011-12-29: qty 200

## 2011-12-29 MED ORDER — CARVEDILOL 25 MG PO TABS
25.0000 mg | ORAL_TABLET | Freq: Two times a day (BID) | ORAL | Status: DC
  Administered 2011-12-29 – 2011-12-31 (×5): 25 mg via ORAL
  Filled 2011-12-29 (×7): qty 1

## 2011-12-29 MED ORDER — INSULIN GLARGINE 100 UNIT/ML ~~LOC~~ SOLN
28.0000 [IU] | Freq: Two times a day (BID) | SUBCUTANEOUS | Status: DC
  Administered 2011-12-29 – 2011-12-30 (×3): 28 [IU] via SUBCUTANEOUS

## 2011-12-29 MED ORDER — AMLODIPINE BESYLATE 10 MG PO TABS
10.0000 mg | ORAL_TABLET | Freq: Every day | ORAL | Status: DC
  Administered 2011-12-29 – 2012-01-02 (×3): 10 mg via ORAL
  Filled 2011-12-29 (×5): qty 1

## 2011-12-29 MED ORDER — HYDRALAZINE HCL 50 MG PO TABS
50.0000 mg | ORAL_TABLET | Freq: Three times a day (TID) | ORAL | Status: DC
  Administered 2011-12-29 – 2012-01-02 (×9): 50 mg via ORAL
  Filled 2011-12-29 (×16): qty 1

## 2011-12-29 MED FILL — Potassium Chloride Inj 2 mEq/ML: INTRAVENOUS | Qty: 40 | Status: AC

## 2011-12-29 MED FILL — Magnesium Sulfate Inj 50%: INTRAMUSCULAR | Qty: 10 | Status: AC

## 2011-12-29 NOTE — Anesthesia Postprocedure Evaluation (Addendum)
Anesthesia Post Note  Patient: David Parsons  Procedure(s) Performed: Procedure(s) (LRB): CORONARY ARTERY BYPASS GRAFTING (CABG) (N/A) BENTALL PROCEDURE (N/A)  Anesthesia type: General  Patient location: ICU  Post pain: Pain level controlled  Post assessment: Post-op Vital signs reviewed  Last Vitals:  Filed Vitals:   12/29/11 1158  BP:   Pulse:   Temp: 36.7 C  Resp:     Post vital signs: stable  Level of consciousness: Patient remains intubated per anesthesia plan  Complications: No apparent anesthesia complications

## 2011-12-29 NOTE — Progress Notes (Signed)
Patient ID: David Parsons, male   DOB: 03/14/1941, 71 y.o.   MRN: [id number]  Filed Vitals:   12/29/11 1600 12/29/11 1700 12/29/11 1800 12/29/11 1958  BP: 119/57 121/59 98/57   Pulse: 83 85 89   Temp:    97.9 F (36.6 C)  TempSrc:      Resp: 5 16 24    Height:      Weight:      SpO2: 100% 99% 97%    Off all drips, tubes out Diuresing well today.  CBC    Component Value Date/Time   WBC 19.1* 12/29/2011 1523   RBC 3.80* 12/29/2011 1523   HGB 11.3* 12/29/2011 1523   HCT 35.3* 12/29/2011 1523   PLT 175 12/29/2011 1523   MCV 92.9 12/29/2011 1523   MCH 29.7 12/29/2011 1523   MCHC 32.0 12/29/2011 1523   RDW 13.4 12/29/2011 1523   LYMPHSABS 1.7 11/13/2011 1018   MONOABS 0.8 11/13/2011 1018   EOSABS 0.3 11/13/2011 1018   BASOSABS 0.1 11/13/2011 1018    BMET    Component Value Date/Time   NA 142 12/29/2011 1522   K 4.4 12/29/2011 1522   CL 105 12/29/2011 1522   CO2 23 12/29/2011 0400   GLUCOSE 132* 12/29/2011 1522   BUN 14 12/29/2011 1522   CREATININE 1.04 12/29/2011 1523   CREATININE 0.85 12/08/2011 1346   CALCIUM 8.1* 12/29/2011 0400   GFRNONAA 71* 12/29/2011 1523   GFRAA 83* 12/29/2011 1523    A/P:  Stable. Continue present course

## 2011-12-29 NOTE — Procedures (Signed)
Extubation Procedure Note  Patient Details:   Name: David Parsons DOB: 10/05/1940 MRN: [id number]   Airway Documentation:  Airway 8.5 mm (Active)  Secured at (cm) 23 cm 12/28/2011 11:21 PM  Measured From Lips 12/28/2011 11:21 PM  Secured Location Right 12/28/2011 11:21 PM  Secured By Pink Tape 12/28/2011 11:21 PM  Site Condition Dry 12/28/2011 11:21 PM    Evaluation  O2 sats: stable throughout Complications: No apparent complications Patient did tolerate procedure well. Bilateral Breath Sounds: Diminished;Clear   Yes  Ave Filter 12/29/2011, 12:34 AM

## 2011-12-29 NOTE — Progress Notes (Signed)
   CARDIOTHORACIC SURGERY PROGRESS NOTE   R1 Day Post-Op Procedure(s) (LRB): CORONARY ARTERY BYPASS GRAFTING (CABG) (N/A) BENTALL PROCEDURE (N/A)  Subjective: Looks good.  Very mild soreness in chest.  No SOB.  Reports feeling well.  Objective: Vital signs: BP Readings from Last 1 Encounters:  12/29/11 128/67   Pulse Readings from Last 1 Encounters:  12/29/11 89   Resp Readings from Last 1 Encounters:  12/29/11 25   Temp Readings from Last 1 Encounters:  12/29/11 99.3 F (37.4 C)     Hemodynamics: PAP: (36-56)/(20-31) 56/31 mmHg CO:  [7 L/min-9.5 L/min] 7 L/min CI:  [2.6 L/min/m2-3.6 L/min/m2] 2.6 L/min/m2  Physical Exam:  Rhythm:   sinus  Breath sounds: clear  Heart sounds:  RRR  Incisions:  Dressings dry  Abdomen:  soft  Extremities:  Warm, well perfused   Intake/Output from previous day: 05/09 0701 - 05/10 0700 In: 8549.7 [P.O.:240; I.V.:5592.7; Blood:2057; NG/GT:60; IV Piggyback:600] Out: 4385 [Urine:2585; Emesis/NG output:150; Blood:1250; Chest Tube:400] Intake/Output this shift: Total I/O In: 20 [I.V.:20] Out: -   Lab Results:  Basename 12/29/11 0400 12/28/11 2258 12/28/11 2250  WBC 17.4* -- 16.6*  HGB 11.7* 12.2* --  HCT 35.7* 36.0* --  PLT 197 -- 199   BMET:  Basename 12/29/11 0400 12/28/11 2258 12/26/11 1218  NA 139 143 --  K 3.8 3.5 --  CL 108 110 --  CO2 23 -- 25  GLUCOSE 112* 122* --  BUN 14 14 --  CREATININE 0.77 1.00 --  CALCIUM 8.1* -- 9.3    CBG (last 3)   Basename 12/29/11 0657 12/29/11 0603 12/29/11 0502  GLUCAP 108* 109* 102*   ABG    Component Value Date/Time   PHART 7.354 12/29/2011 0156   HCO3 24.2* 12/29/2011 0156   TCO2 26 12/29/2011 0156   ACIDBASEDEF 1.0 12/29/2011 0156   O2SAT 95.0 12/29/2011 0156   CXR: Looks good with expected bibasilar atelectasis and mild congestion  Assessment/Plan: S/P Procedure(s) (LRB): CORONARY ARTERY BYPASS GRAFTING (CABG) (N/A) BENTALL PROCEDURE (N/A)  Doing well POD1 Expected  post op acute blood loss anemia, mild, stable Expected post op volume excess, moderate Hypertension Type II diabetes mellitus, glycemic control excellent but still on insulin drip Morbid obesity   Mobilize  D/C tubes and lines  Diuresis  Add lantus insulin and wean drip  Restart carvedilol, amlodipine, hydralazine  Restart ARB in 1-2 days if renal function stable  Labetalol prn severe HTN    Celine Dishman H 12/29/2011 7:36 AM

## 2011-12-30 ENCOUNTER — Inpatient Hospital Stay (HOSPITAL_COMMUNITY): Payer: Medicare Other

## 2011-12-30 LAB — GLUCOSE, CAPILLARY
Glucose-Capillary: 129 mg/dL — ABNORMAL HIGH (ref 70–99)
Glucose-Capillary: 121 mg/dL — ABNORMAL HIGH (ref 70–99)
Glucose-Capillary: 97 mg/dL (ref 70–99)

## 2011-12-30 LAB — CBC
MCH: 29.6 pg (ref 26.0–34.0)
Platelets: 148 10*3/uL — ABNORMAL LOW (ref 150–400)
RBC: 3.65 MIL/uL — ABNORMAL LOW (ref 4.22–5.81)
RDW: 13.3 % (ref 11.5–15.5)
WBC: 16.7 10*3/uL — ABNORMAL HIGH (ref 4.0–10.5)

## 2011-12-30 LAB — BASIC METABOLIC PANEL
Calcium: 8.2 mg/dL — ABNORMAL LOW (ref 8.4–10.5)
GFR calc non Af Amer: 75 mL/min — ABNORMAL LOW (ref >90)
Sodium: 138 mEq/L (ref 135–145)

## 2011-12-30 MED ORDER — PANTOPRAZOLE SODIUM 40 MG PO TBEC
40.0000 mg | DELAYED_RELEASE_TABLET | Freq: Every day | ORAL | Status: DC
  Administered 2011-12-31 – 2012-01-02 (×3): 40 mg via ORAL
  Filled 2011-12-30 (×3): qty 1

## 2011-12-30 MED ORDER — OXYCODONE HCL 5 MG PO TABS
5.0000 mg | ORAL_TABLET | ORAL | Status: DC | PRN
  Administered 2011-12-30 – 2012-01-01 (×4): 10 mg via ORAL
  Filled 2011-12-30 (×4): qty 2

## 2011-12-30 MED ORDER — BISACODYL 5 MG PO TBEC: 10.0000 mg | DELAYED_RELEASE_TABLET | Freq: Every day | ORAL | Status: DC | PRN

## 2011-12-30 MED ORDER — SODIUM CHLORIDE 0.9 % IJ SOLN
3.0000 mL | Freq: Two times a day (BID) | INTRAMUSCULAR | Status: DC
  Administered 2011-12-30 – 2012-01-02 (×7): 3 mL via INTRAVENOUS

## 2011-12-30 MED ORDER — MOVING RIGHT ALONG BOOK
Freq: Once | Status: AC
  Administered 2011-12-30: 14:00:00
  Filled 2011-12-30: qty 1

## 2011-12-30 MED ORDER — DOCUSATE SODIUM 100 MG PO CAPS
200.0000 mg | ORAL_CAPSULE | Freq: Every day | ORAL | Status: DC
  Administered 2011-12-30 – 2012-01-02 (×4): 200 mg via ORAL
  Filled 2011-12-30 (×4): qty 2

## 2011-12-30 MED ORDER — BISACODYL 10 MG RE SUPP: 10.0000 mg | Freq: Every day | RECTAL | Status: DC | PRN

## 2011-12-30 MED ORDER — FUROSEMIDE 40 MG PO TABS
40.0000 mg | ORAL_TABLET | Freq: Every day | ORAL | Status: AC
  Administered 2011-12-31 – 2012-01-01 (×2): 40 mg via ORAL
  Filled 2011-12-30 (×2): qty 1

## 2011-12-30 MED ORDER — SODIUM CHLORIDE 0.9 % IV SOLN
250.0000 mL | INTRAVENOUS | Status: DC | PRN
  Administered 2011-12-30: 250 mL via INTRAVENOUS

## 2011-12-30 MED ORDER — INSULIN ASPART 100 UNIT/ML ~~LOC~~ SOLN: 0.0000 [IU] | Freq: Three times a day (TID) | SUBCUTANEOUS | Status: DC

## 2011-12-30 MED ORDER — ACETAMINOPHEN 325 MG PO TABS: 650.0000 mg | ORAL_TABLET | Freq: Four times a day (QID) | ORAL | Status: DC | PRN

## 2011-12-30 MED ORDER — TRAMADOL HCL 50 MG PO TABS: 50.0000 mg | ORAL_TABLET | ORAL | Status: DC | PRN

## 2011-12-30 MED ORDER — SODIUM CHLORIDE 0.9 % IJ SOLN: 3.0000 mL | INTRAMUSCULAR | Status: DC | PRN

## 2011-12-30 MED ORDER — POTASSIUM CHLORIDE 10 MEQ/50ML IV SOLN
10.0000 meq | INTRAVENOUS | Status: AC
  Administered 2011-12-30 (×3): 10 meq via INTRAVENOUS

## 2011-12-30 MED ORDER — ASPIRIN EC 325 MG PO TBEC
325.0000 mg | DELAYED_RELEASE_TABLET | Freq: Every day | ORAL | Status: DC
  Administered 2011-12-30 – 2012-01-02 (×4): 325 mg via ORAL
  Filled 2011-12-30 (×4): qty 1

## 2011-12-30 MED ORDER — ONDANSETRON HCL 4 MG PO TABS: 4.0000 mg | ORAL_TABLET | Freq: Four times a day (QID) | ORAL | Status: DC | PRN

## 2011-12-30 MED ORDER — ONDANSETRON HCL 4 MG/2ML IJ SOLN
4.0000 mg | Freq: Four times a day (QID) | INTRAMUSCULAR | Status: DC | PRN
  Administered 2011-12-30: 4 mg via INTRAVENOUS
  Filled 2011-12-30: qty 2

## 2011-12-30 MED ORDER — POTASSIUM CHLORIDE CRYS ER 20 MEQ PO TBCR
40.0000 meq | EXTENDED_RELEASE_TABLET | Freq: Every day | ORAL | Status: AC
  Administered 2011-12-31 – 2012-01-01 (×2): 40 meq via ORAL
  Filled 2011-12-30 (×2): qty 2

## 2011-12-30 NOTE — Progress Notes (Signed)
Dr Laneta Simmers paged. Pt CBG 97 and pt to receive 28 units Lantus insulin. Pt has had nausea  and decreased appetite. Order given to hold this dose.    Sharlyne Cai, R.N.

## 2011-12-30 NOTE — Progress Notes (Signed)
CARDIAC REHAB PHASE I   PRE:  Rate/Rhythm: 78 sinus  BP:  Supine: 124/71  Sitting:   Standing:    SaO2: 96% 2L  MODE:  Ambulation: 150 ft   POST:  Rate/Rhythem: 86 sinus  BP:  Supine:   Sitting: 126/71  Standing:    SaO2: 100% 2L  Order received and appreciated.  Chart reviewed.  Pt ambulated 130ft with assist x2 using rolling walker.  This was the pt's second walk for the day, first after transfer.  Pt tolerated walk well, but tired quickly.  Breathing was audible but not labored.  We will f/u on Monday. Fabio Pierce, MA, ACSM RCEP [phone]  Hazle Nordmann

## 2011-12-30 NOTE — Plan of Care (Signed)
Problem: Phase III Progression Outcomes Goal: Time patient transferred to PCTU/Telemetry POD Outcome: Completed/Met Date Met:  12/30/11 1400

## 2011-12-30 NOTE — Progress Notes (Signed)
2 Days Post-Op Procedure(s) (LRB): CORONARY ARTERY BYPASS GRAFTING (CABG) (N/A) BENTALL PROCEDURE (N/A) Subjective: No complaints. Walked well  Objective: Vital signs in last 24 hours: Temp:  [97.9 F (36.6 C)-98.6 F (37 C)] 98.1 F (36.7 C) (05/11 0708) Pulse Rate:  [80-90] 81  (05/11 1100) Cardiac Rhythm:  [-] Normal sinus rhythm (05/11 0800) Resp:  [5-31] 23  (05/11 1100) BP: (89-122)/(52-68) 104/55 mmHg (05/11 1100) SpO2:  [92 %-100 %] 93 % (05/11 1100) Arterial Line BP: (125)/(57) 125/57 mmHg (05/10 1130) Weight:  [161.4 kg (355 lb 13.2 oz)] 161.4 kg (355 lb 13.2 oz) (05/11 0600)  Hemodynamic parameters for last 24 hours:    Intake/Output from previous day: 05/10 0701 - 05/11 0700 In: 1183.4 [P.O.:60; I.V.:565.4; IV Piggyback:558] Out: 2885 [Urine:2785; Chest Tube:100] Intake/Output this shift: Total I/O In: 336 [P.O.:120; I.V.:60; IV Piggyback:156] Out: 75 [Urine:75]  General appearance: alert and cooperative Neurologic: intact Heart: regular rate and rhythm, S1, S2 normal, no murmur, click, rub or gallop Lungs: clear to auscultation bilaterally Extremities: extremities normal, atraumatic, no cyanosis or edema Wound: incision ok  Lab Results:  Basename 12/30/11 0330 12/29/11 1523  WBC 16.7* 19.1*  HGB 10.8* 11.3*  HCT 34.0* 35.3*  PLT 148* 175   BMET:  Basename 12/30/11 0330 12/29/11 1523 12/29/11 1522 12/29/11 0400  NA 138 -- 142 --  K 3.7 -- 4.4 --  CL 103 -- 105 --  CO2 29 -- -- 23  GLUCOSE 117* -- 132* --  BUN 17 -- 14 --  CREATININE 1.00 1.04 -- --  CALCIUM 8.2* -- -- 8.1*    PT/INR:  Basename 12/28/11 1655  LABPROT 15.5*  INR 1.20   ABG    Component Value Date/Time   PHART 7.354 12/29/2011 0156   HCO3 24.2* 12/29/2011 0156   TCO2 26 12/29/2011 1522   ACIDBASEDEF 1.0 12/29/2011 0156   O2SAT 95.0 12/29/2011 0156   CBG (last 3)   Basename 12/30/11 0702 12/30/11 0340 12/29/11 2320  GLUCAP 121* 129* 124*   CXR:  Mild bibasilar  atelectasis  Assessment/Plan: S/P Procedure(s) (LRB): CORONARY ARTERY BYPASS GRAFTING (CABG) (N/A) BENTALL PROCEDURE (N/A) Mobilize Diuresis Plan for transfer to step-down: see transfer orders   LOS: 2 days    Tahirah Sara K 12/30/2011

## 2011-12-31 LAB — GLUCOSE, CAPILLARY
Glucose-Capillary: 76 mg/dL (ref 70–99)
Glucose-Capillary: 100 mg/dL — ABNORMAL HIGH (ref 70–99)
Glucose-Capillary: 93 mg/dL (ref 70–99)
Glucose-Capillary: 84 mg/dL (ref 70–99)

## 2011-12-31 MED ORDER — WARFARIN SODIUM 5 MG PO TABS
5.0000 mg | ORAL_TABLET | Freq: Once | ORAL | Status: AC
  Administered 2011-12-31: 5 mg via ORAL
  Filled 2011-12-31: qty 1

## 2011-12-31 MED ORDER — WARFARIN - PHYSICIAN DOSING INPATIENT: Freq: Every day | Status: DC

## 2011-12-31 MED ORDER — AMIODARONE HCL 200 MG PO TABS
400.0000 mg | ORAL_TABLET | Freq: Two times a day (BID) | ORAL | Status: DC
  Administered 2011-12-31 – 2012-01-01 (×4): 400 mg via ORAL
  Filled 2011-12-31 (×7): qty 2

## 2011-12-31 NOTE — Progress Notes (Addendum)
[address]            [place] [address]          [phone]     3 Days Post-Op  Procedure(s) (LRB): CORONARY ARTERY BYPASS GRAFTING (CABG) (N/A) BENTALL PROCEDURE (N/A) Subjective: Overall feels well, some nausea that resolved, mild neck discomfort but little pain, no SOB.  Objective  Telemetry some afib with CVR  Temp:  [97.5 F (36.4 C)-98.7 F (37.1 C)] 98.7 F (37.1 C) (05/11 2120) Pulse Rate:  [68-87] 83  (05/12 0753) Resp:  [17-23] 20  (05/12 0419) BP: (89-125)/(53-69) 105/53 mmHg (05/12 0753) SpO2:  [91 %-99 %] 98 % (05/12 0419) Weight:  [353 lb 9.9 oz (160.4 kg)] 353 lb 9.9 oz (160.4 kg) (05/12 0419)   Intake/Output Summary (Last 24 hours) at 12/31/11 0838 Last data filed at 12/31/11 0600  Gross per 24 hour  Intake    230 ml  Output    830 ml  Net   -600 ml       General appearance: alert, cooperative and no distress Heart: irregularly irregular rhythm and soft systolic murmur Lungs: mildly diminished in bases, faint wheeze clears with cough Abdomen: soft, nontender Extremities: minor edema Wound: incisions healing well  Lab Results:  Basename 12/30/11 0330 12/29/11 1523 12/29/11 1522 12/29/11 0400  NA 138 -- 142 --  K 3.7 -- 4.4 --  CL 103 -- 105 --  CO2 29 -- -- 23  GLUCOSE 117* -- 132* --  BUN 17 -- 14 --  CREATININE 1.00 1.04 -- --  CALCIUM 8.2* -- -- 8.1*  MG -- 2.3 -- 2.5  PHOS -- -- -- --   No results found for this basename: AST:2,ALT:2,ALKPHOS:2,BILITOT:2,PROT:2,ALBUMIN:2 in the last 72 hours No results found for this basename: LIPASE:2,AMYLASE:2 in the last 72 hours  Basename 12/30/11 0330 12/29/11 1523  WBC 16.7* 19.1*  NEUTROABS -- --  HGB 10.8* 11.3*  HCT 34.0* 35.3*  MCV 93.2 92.9  PLT 148* 175   No results found for this basename: CKTOTAL:4,CKMB:4,TROPONINI:4 in the last 72 hours No components found with this basename: POCBNP:3 No results found for this basename: DDIMER in the last 72  hours No results found for this basename: HGBA1C in the last 72 hours No results found for this basename: CHOL,HDL,LDLCALC,TRIG,CHOLHDL in the last 72 hours No results found for this basename: TSH,T4TOTAL,FREET3,T3FREE,THYROIDAB in the last 72 hours No results found for this basename: VITAMINB12,FOLATE,FERRITIN,TIBC,IRON,RETICCTPCT in the last 72 hours  Medications: Scheduled    . amLODipine  10 mg Oral Daily  . aspirin EC  325 mg Oral Daily  . carvedilol  25 mg Oral BID WC  . cefUROXime (ZINACEF)  IV  1.5 g Intravenous Q12H  . docusate sodium  200 mg Oral Daily  . furosemide  40 mg Oral Daily  . hydrALAZINE  50 mg Oral Q8H  . insulin aspart  0-24 Units Subcutaneous TID AC & HS  . insulin glargine  28 Units Subcutaneous BID  . moving right along book   Does not apply Once  . pantoprazole  40 mg Oral QAC breakfast  . potassium chloride  10 mEq Intravenous Q1 Hr x 3  . potassium chloride  40 mEq Oral Daily  . sodium chloride  3 mL Intravenous Q12H  . DISCONTD: acetaminophen (TYLENOL) oral liquid 160 mg/5 mL  975 mg Per Tube Q6H  .  DISCONTD: acetaminophen  1,000 mg Oral Q6H  . DISCONTD: aspirin  324 mg Per Tube Daily  . DISCONTD: aspirin EC  325 mg Oral Daily  . DISCONTD: bisacodyl  10 mg Oral Daily  . DISCONTD: bisacodyl  10 mg Rectal Daily  . DISCONTD: docusate sodium  200 mg Oral Daily  . DISCONTD: insulin aspart  0-24 Units Subcutaneous Q4H  . DISCONTD: pantoprazole  40 mg Oral Q1200  . DISCONTD: sodium chloride  3 mL Intravenous Q12H     Radiology/Studies:  Dg Chest Portable 1 View In Am  12/30/2011  *RADIOLOGY REPORT*  Clinical Data: Postop cardiac surgery  PORTABLE CHEST - 1 VIEW  Comparison: 12/29/2011  Findings:  Status post median sternotomy and CABG procedure.  Stable cardiac enlargement  Bilateral pleural effusions have decreased aeration of both lung bases is again noted. The appearance is similar to prior exam.  The upper lobes are clear.  IMPRESSION:  1. Cardiac  enlargement, pleural effusions and decreased aeration the lung bases appear similar to previous exam. 2.  No complication after extubation.  Original Report Authenticated By: Rosealee Albee, M.D.    INR: Will add last result for INR, ABG once components are confirmed Will add last 4 CBG results once components are confirmed  Assessment/Plan: S/P Procedure(s) (LRB): CORONARY ARTERY BYPASS GRAFTING (CABG) (N/A) BENTALL PROCEDURE (N/A)  1 Doing well, cont rehab 2 will add oral amiodarone, watch BP closely , relatively stable but did go to 80's systolic yesterday morning 3 pulm toilet 4 leukocytosis improved, H/H stable 5 cbg's good control  76-116 6 gentle diuresis   LOS: 3 days    GOLD,WAYNE E 5/12/20138:38 AM    Chart reviewed, patient examined, agree with above. Developed irregular rhythm overnight and appears to be in atrial flutter with vent rate in 60's this am.  Amio started orally. Will start coumadin slowly in case he does not convert. Will hold carvedilol for now with HR in 60's and BP around 100.

## 2012-01-01 ENCOUNTER — Encounter (HOSPITAL_COMMUNITY): Payer: Self-pay | Admitting: Thoracic Surgery (Cardiothoracic Vascular Surgery)

## 2012-01-01 DIAGNOSIS — I10 Essential (primary) hypertension: Secondary | ICD-10-CM

## 2012-01-01 DIAGNOSIS — I712 Thoracic aortic aneurysm, without rupture: Principal | ICD-10-CM

## 2012-01-01 LAB — CBC
Hemoglobin: 10.3 g/dL — ABNORMAL LOW (ref 13.0–17.0)
MCH: 30 pg (ref 26.0–34.0)
MCHC: 32.4 g/dL (ref 30.0–36.0)
RDW: 13.1 % (ref 11.5–15.5)

## 2012-01-01 LAB — GLUCOSE, CAPILLARY: Glucose-Capillary: 94 mg/dL (ref 70–99)

## 2012-01-01 LAB — BASIC METABOLIC PANEL
BUN: 21 mg/dL (ref 6–23)
Creatinine, Ser: 0.91 mg/dL (ref 0.50–1.35)
GFR calc Af Amer: >90 mL/min (ref >90)
GFR calc non Af Amer: 84 mL/min — ABNORMAL LOW (ref >90)
Glucose, Bld: 119 mg/dL — ABNORMAL HIGH (ref 70–99)
Potassium: 3.7 mEq/L (ref 3.5–5.1)

## 2012-01-01 MED ORDER — OXYCODONE HCL 5 MG PO TABS: 5.0000 mg | ORAL_TABLET | ORAL | Status: AC | PRN

## 2012-01-01 MED ORDER — TRAMADOL HCL 50 MG PO TABS: 50.0000 mg | ORAL_TABLET | ORAL | Status: AC | PRN

## 2012-01-01 MED ORDER — WARFARIN SODIUM 2.5 MG PO TABS
2.5000 mg | ORAL_TABLET | Freq: Every day | ORAL | Status: DC
  Administered 2012-01-01: 2.5 mg via ORAL
  Filled 2012-01-01 (×2): qty 1

## 2012-01-01 MED ORDER — AMIODARONE HCL 400 MG PO TABS: 400.0000 mg | ORAL_TABLET | Freq: Two times a day (BID) | ORAL | Status: DC

## 2012-01-01 MED ORDER — WARFARIN SODIUM 2.5 MG PO TABS: 2.5000 mg | ORAL_TABLET | Freq: Every day | ORAL | Status: DC

## 2012-01-01 MED FILL — Sodium Chloride Irrigation Soln 0.9%: Qty: 3000 | Status: AC

## 2012-01-01 MED FILL — Sodium Chloride IV Soln 0.9%: INTRAVENOUS | Qty: 1000 | Status: AC

## 2012-01-01 MED FILL — Heparin Sodium (Porcine) Inj 1000 Unit/ML: INTRAMUSCULAR | Qty: 30 | Status: AC

## 2012-01-01 MED FILL — Electrolyte-R (PH 7.4) Solution: INTRAVENOUS | Qty: 4000 | Status: AC

## 2012-01-01 MED FILL — Mannitol IV Soln 20%: INTRAVENOUS | Qty: 500 | Status: AC

## 2012-01-01 MED FILL — Lidocaine HCl IV Inj 20 MG/ML: INTRAVENOUS | Qty: 10 | Status: AC

## 2012-01-01 MED FILL — Heparin Sodium (Porcine) Inj 1000 Unit/ML: INTRAMUSCULAR | Qty: 10 | Status: AC

## 2012-01-01 MED FILL — Sodium Bicarbonate IV Soln 8.4%: INTRAVENOUS | Qty: 50 | Status: AC

## 2012-01-01 NOTE — Progress Notes (Signed)
Pt ambulated 750 feet with rolling walker on room air. Tolerated well. Became SOB towards end of walk. Placed pt back to bed with call bell in reach

## 2012-01-01 NOTE — Care Management Note (Signed)
    Page 1 of 1   01/01/2012     2:42:31 PM   CARE MANAGEMENT NOTE 01/01/2012  Patient:  David Parsons,David Parsons   Account Number:  [id number]  Date Initiated:  01/01/2012  Documentation initiated by:  Dartha Rozzell  Subjective/Objective Assessment:   PT Parsons/P BENTALL AORTIC ROOT REPLACEMENT AND CABG X 1 ON 12/28/11.  PTA, PT INDEPENDENT, LIVES WITH SPOUSE.     Action/Plan:   MET WITH PT AND FAMILY TO DISCUSS DC PLANS.  WIFE TO PROVIDE 24HR CARE AT DC.  PT REQUESTS RW FOR HOME.  WILL REQUEST DME FROM [facility].  WILL CONT TO FOLLOW FOR ADDITIONAL HOME NEEDS.   Anticipated DC Date:  01/04/2012   Anticipated DC Plan:  HOME W HOME HEALTH SERVICES      DC Planning Services  CM consult      Choice offered to / List presented to:     DME arranged  David Parsons      DME agency  [facility]        Status of service:  In process, will continue to follow Medicare Important Message given?   (If response is "NO", the following Medicare IM given date fields will be blank) Date Medicare IM given:   Date Additional Medicare IM given:    Discharge Disposition:    Per UR Regulation:    If discussed at Long Length of Stay Meetings, dates discussed:    Comments:  01/01/12 Aiza Vollrath,RN,BSN 1440 REFERRAL TO David Parsons WITH [facility] FOR DME NEEDS.

## 2012-01-01 NOTE — Progress Notes (Addendum)
4 Days Post-Op Procedure(s) (LRB): CORONARY ARTERY BYPASS GRAFTING (CABG) (N/A) BENTALL PROCEDURE (N/A) Subjective:  David Parsons has no complaints this morning.  He was experiencing A.fib/flutter over the weekend, but is currently maintaining NSR with first degree heart block  Objective: Vital signs in last 24 hours: Temp:  [97.7 F (36.5 C)-98.5 F (36.9 C)] 98.5 F (36.9 C) (05/13 0624) Pulse Rate:  [65-77] 77  (05/13 0624) Cardiac Rhythm:  [-] Atrial flutter (05/12 1930) Resp:  [20] 20  (05/13 0624) BP: (99-127)/(53-72) 114/72 mmHg (05/13 0624) SpO2:  [96 %-98 %] 96 % (05/13 0624) Weight:  [355 lb 6.1 oz (161.2 kg)] 355 lb 6.1 oz (161.2 kg) (05/13 0643)  Intake/Output from previous day: 05/12 0701 - 05/13 0700 In: 840 [P.O.:840] Out: 400 [Urine:400]    General appearance: alert, cooperative and no distress Heart: regular rate and rhythm Lungs: clear to auscultation bilaterally Abdomen: soft, non-tender; bowel sounds normal; no masses,  no organomegaly Extremities: edema trace Wound: clean and dry  Lab Results:  Basename 12/30/11 0330 12/29/11 1523  WBC 16.7* 19.1*  HGB 10.8* 11.3*  HCT 34.0* 35.3*  PLT 148* 175   BMET:  Basename 12/30/11 0330 12/29/11 1523 12/29/11 1522  NA 138 -- 142  K 3.7 -- 4.4  CL 103 -- 105  CO2 29 -- --  GLUCOSE 117* -- 132*  BUN 17 -- 14  CREATININE 1.00 1.04 --  CALCIUM 8.2* -- --    PT/INR: No results found for this basename: LABPROT,INR in the last 72 hours ABG    Component Value Date/Time   PHART 7.354 12/29/2011 0156   HCO3 24.2* 12/29/2011 0156   TCO2 26 12/29/2011 1522   ACIDBASEDEF 1.0 12/29/2011 0156   O2SAT 95.0 12/29/2011 0156   CBG (last 3)   Basename 12/31/11 2106 12/31/11 1619 12/31/11 1122  GLUCAP 84 105* 93    Assessment/Plan: S/P Procedure(s) (LRB): CORONARY ARTERY BYPASS GRAFTING (CABG) (N/A) BENTALL PROCEDURE (N/A)  2. CV- previous A. Fib/Flutter, currently NSR with first degree heart block, rate in the  mid 70s, pressure controlled, will continue Amiodarone, Norvasc, Hydralazine, will hold beta blocker due to issues with bradycardia 3. Resp- no dyspnea, will continue IS, wean oxygen as tolerated 4. INR- not drawn, given one dose of coumadin 5mg  last night, will order PT/INR in AM and give 2.5 mg coumadin tonight 5. Dispo- patient doing very well, will likely be stable for d/c in next 24-48 hours   LOS: 4 days    BARRETT, ERIN 01/01/2012   I have seen and examined the patient and agree with the assessment and plan as outlined.  However, as long as he continues to maintain sinus rhythm I do not think he should remain on coumadin.  Makynlee Kressin H 01/01/2012 8:37 AM

## 2012-01-01 NOTE — Progress Notes (Signed)
    SUBJECTIVE: No complaints today.   Tele: NSR  BP 116/75  Pulse 76  Temp(Src) 98.6 F (37 C) (Oral)  Resp 18  Ht 6\' 1"  (1.854 m)  Wt 355 lb 6.1 oz (161.2 kg)  BMI 46.89 kg/m2  SpO2 96%  Intake/Output Summary (Last 24 hours) at 01/01/12 1550 Last data filed at 01/01/12 1300  Gross per 24 hour  Intake    840 ml  Output    400 ml  Net    440 ml    PHYSICAL EXAM General: Well developed, well nourished, in no acute distress. Alert and oriented x 3.  Psych:  Good affect, responds appropriately CV: RRR Lungs: Clear bilaterally  LABS: Basic Metabolic Panel:  Basename 01/01/12 0920 12/30/11 0330  NA 136 138  K 3.7 3.7  CL 101 103  CO2 27 29  GLUCOSE 119* 117*  BUN 21 17  CREATININE 0.91 1.00  CALCIUM 8.5 8.2*  MG -- --  PHOS -- --   CBC:  Basename 01/01/12 0920 12/30/11 0330  WBC 12.3* 16.7*  NEUTROABS -- --  HGB 10.3* 10.8*  HCT 31.8* 34.0*  MCV 92.7 93.2  PLT 193 148*     Current Meds:    . amiodarone  400 mg Oral BID  . amLODipine  10 mg Oral Daily  . aspirin EC  325 mg Oral Daily  . docusate sodium  200 mg Oral Daily  . furosemide  40 mg Oral Daily  . hydrALAZINE  50 mg Oral Q8H  . insulin aspart  0-24 Units Subcutaneous TID AC & HS  . pantoprazole  40 mg Oral QAC breakfast  . potassium chloride  40 mEq Oral Daily  . sodium chloride  3 mL Intravenous Q12H  . warfarin  2.5 mg Oral q1800  . warfarin  5 mg Oral ONCE-1800  . Warfarin - Physician Dosing Inpatient   Does not apply q1800     ASSESSMENT AND PLAN:  1. 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 Doing well. He has no complaints. I appreciated the excellent care of Dr. Cornelius Moras and the surgical team.   2. Atrial fib/flutter: Now NSR. If no recurrence of arrythmia here in the hospital, can probably d/c coumadin.   3. HTN: BP well controlled.     David Parsons  5/13/20133:50 PM

## 2012-01-01 NOTE — Discharge Summary (Signed)
Physician Discharge Summary  Patient ID: David Parsons MRN: [id number] DOB/AGE: 1941/01/17 71 y.o.  Admit date: 12/28/2011 Discharge date: 01/02/2012  Admission Diagnoses:  Patient Active Problem List  Diagnoses  . Obesity, Class III, BMI 40-49.9 (morbid obesity)  . HYPERTENSION  . HYPERTENSIVE CARDIOVASCULAR DISEASE  . CAD, NATIVE VESSEL  . AORTIC INSUFFICIENCY, MODERATE  . CARDIOMYOPATHY, DILATED  . CARDIOMYOPATHY, SECONDARY  . CONGESTIVE HEART FAILURE, UNSPECIFIED  . Thoracic aneurysm without mention of rupture  . OTHER ABNORMAL GLUCOSE  . NONSPECIFIC ABNORMAL ELECTROCARDIOGRAM  . PSA, INCREASED  . Iron deficiency anemia, unspecified  . Personal history of colonic polyps  . Family history of malignant neoplasm of gastrointestinal tract  . Dystrophic nail   Discharge Diagnoses:   Patient Active Problem List  Diagnoses  . Obesity, Class III, BMI 40-49.9 (morbid obesity)  . HYPERTENSION  . HYPERTENSIVE CARDIOVASCULAR DISEASE  . CAD, NATIVE VESSEL  . AORTIC INSUFFICIENCY, MODERATE  . CARDIOMYOPATHY, DILATED  . CARDIOMYOPATHY, SECONDARY  . CONGESTIVE HEART FAILURE, UNSPECIFIED  . Thoracic aneurysm without mention of rupture  . OTHER ABNORMAL GLUCOSE  . NONSPECIFIC ABNORMAL ELECTROCARDIOGRAM  . PSA, INCREASED  . Iron deficiency anemia, unspecified  . Personal history of colonic polyps  . Family history of malignant neoplasm of gastrointestinal tract  . Dystrophic nail  . 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 Clifton James.  The patient underwent CTA chest on 11/03/2011 which demonstrated significant enlargement of the aortic aneurysm.  Due to the increase in aneurysms size, he  was referred to [facility] for possible surgical intervention.  He was evaluated by Dr. Cornelius Moras on 11/07/2011 at which time the patient denied any chest pain, dyspnea, back pain, or syncopal episodes.  Dr. Cornelius Moras reviewed the patients CTA which measured the patient aorta as being 6.4cm in size, however he felt the patients aorta remained less than 6cm.  However, due to the patients longstanding medical history of hypertension and continued growth of the patients aneurysm, there was some concern on possible acute aortic dissection.  Due to the concern it was felt the patient would benefit from proceeding with an elective aortic replacement.  The patient was agreeable to this however, there was further preoperative testing which needed to be completed.  The patient was taken for cardiac catheterization on 11/22/2011 which revealed no disease on the left, however the right side was not visualized.  He also underwent TEE which revealed some moderate aortic insufficiency.  The patient again presented to Dr. Barry Dienes office on 11/27/2011 at which time the patient wished to delay surgery until after his sisters brain surgery.  The patient was scheduled to undergo a CTA of his chest to further evaluate his RCA.  This was performed on 12/13/2011 and was not adequate in evaluation of the RCA, however it again showed interval increase in the patient's aortic aneurysm.  He was contact by Dr. Barry Dienes office to further discuss these results.  He was again evaluated by Dr. Cornelius Moras on 12/15/2011 at which time the patient was again educated on the concern of aortic aneurysm dissection. This was discussed at length and the patient was encouraged to move the date of his surgery up.  The patient stated he would  be willing to do this should his sister be recovering without complications.  He was tentatively scheduled for a valve sparing Aortic Root Replacement, a bypass graft to his RCA due to having to reimplant the right coronary, and a possible Bental  procedure should his native valve not be kept.  Hospital Course:   David Parsons presented to [facility] on 12/28/2011 and underwent a Biological Bentall Aortic Root Replacement utilizing a Edwards Magna Ease 25mm Pericardial Tissue Valve and a Vascutek Gelweave Valsalva 28mm Aortic Graft, CABG x1 with SVG to Distal RCA, and Endoscopic Saphanous Vein Harvest from Right Thigh.  The patient tolerated the procedure well and was taken to the surgical ICU in stable condition.  POD #1 the patient was extubated.  His chest drains and lines were removed without difficulty.  POD #2 the patient's chest xray did not reveal any evidence of pneumothorax.  He was medically stable and transferred to the step down unit in stable condition.  POD #3 the patient developed A. Flutter overnight.  He was placed on oral Amiodarone and coumadin therapy.  The patient was bradycardic and his coreg was discontinued.  POD #4 the patient is maintaining NSR.  POD #5 The patient is doing very well. He remains in NSR and therefore will not be discharged home on Coumadin.  We will also continue to hold the patients Coreg due to Bradycardia.  His EPW will be removed, if no arrythmia develops he will be discharged home today.  He will need to follow up with his Cardiologist in 2 weeks and will follow up with Dr. Cornelius Moras on 01/22/2012 at 2:00pm.  He will need to have a chest xray completed on Friday 01/19/2012 at [facility].   Disposition: 01-Home or Self Care  Discharge Orders    Future Appointments: Provider: Department: Dept Phone: Center:   01/22/2012 2:00 PM Purcell Nails, MD [facility] [phone] [facility]   01/24/2012 8:30 AM Edwyna Perfect, MD [facility] [phone] [facility]     Future Orders Please Complete By Expires   Amb Referral to Cardiac Rehabilitation        Medication List  As of 01/02/2012  7:41 AM   STOP taking these medications         carvedilol 25 MG tablet      furosemide 40 MG tablet       potassium chloride SA 20 MEQ tablet      telmisartan-hydrochlorothiazide 80-25 MG per tablet         TAKE these medications         amiodarone 400 MG tablet   Commonly known as: PACERONE   Take 1 tablet (400 mg total) by mouth 2 (two) times daily.      amLODipine 10 MG tablet   Commonly known as: NORVASC   Take 10 mg by mouth daily.      aspirin 81 MG tablet   Take 81 mg by mouth daily.      ferrous sulfate 325 (65 FE) MG tablet   Take 325 mg by mouth 2 (two) times daily before a meal.      hydrALAZINE 50 MG tablet   Commonly known as: APRESOLINE   Take 150 mg by mouth daily.      mulitivitamin with minerals Tabs   Take 1 tablet by mouth daily.      omeprazole 20 MG capsule   Commonly known as: PRILOSEC   Take 1 capsule (20 mg total) by mouth daily. 30  minutes before breakfast      oxyCODONE 5 MG immediate release tablet   Commonly known as: Oxy IR/ROXICODONE   Take 1-2 tablets (5-10 mg total) by mouth every 4 (four) hours as needed.      traMADol 50 MG tablet   Commonly known as: ULTRAM   Take 1-2 tablets (50-100 mg total) by mouth every 4 (four) hours as needed.           Follow-up Information    Follow up with Purcell Nails, MD on 01/22/2012. (appointment at 2:00)    Contact information:   [address] [place] [address] [phone]       Follow up with [facility] in 3 weeks. (Please get chest xray performed Friday prior to your appointment with Dr. Cornelius Moras)    Contact information:   [address], [address]      Follow up with Verne Carrow, MD. Schedule an appointment as soon as possible for a visit in 2 weeks.   Contact information:   [facility] [address] [place] [address] [phone]          Signed: Lowella Dandy 01/02/2012, 7:41 AM

## 2012-01-01 NOTE — Progress Notes (Signed)
CARDIAC REHAB PHASE I   PRE:  Rate/Rhythm: 82 SR  BP:  Supine:   Sitting: 129/60  Standing:    SaO2: 95 RA  MODE:  Ambulation: 350 ft   POST:  Rate/Rhythem: 110 ST  BP:  Supine:   Sitting: 141/68  Standing:    SaO2: 95 RA [id number] Assisted X 1 and used walker to ambulate. Gait steady with walker. Walked 350 feet without c/o. VS stable. Pt back to recliner after walk with call light in reach. Discussed Outpt. CRP with pt. He agrees to  referral to [place].  Beatrix Fetters

## 2012-01-02 LAB — GLUCOSE, CAPILLARY
Glucose-Capillary: 147 mg/dL — ABNORMAL HIGH (ref 70–99)
Glucose-Capillary: 89 mg/dL (ref 70–99)
Glucose-Capillary: 113 mg/dL — ABNORMAL HIGH (ref 70–99)
Glucose-Capillary: 99 mg/dL (ref 70–99)

## 2012-01-02 MED ORDER — CARVEDILOL 25 MG PO TABS
25.0000 mg | ORAL_TABLET | Freq: Two times a day (BID) | ORAL | Status: DC
  Administered 2012-01-02: 25 mg via ORAL
  Filled 2012-01-02 (×3): qty 1

## 2012-01-02 MED ORDER — AMIODARONE HCL 200 MG PO TABS
200.0000 mg | ORAL_TABLET | Freq: Two times a day (BID) | ORAL | Status: DC
  Administered 2012-01-02: 200 mg via ORAL
  Filled 2012-01-02 (×2): qty 1

## 2012-01-02 MED ORDER — AMIODARONE HCL 200 MG PO TABS: 200.0000 mg | ORAL_TABLET | Freq: Two times a day (BID) | ORAL | Status: DC

## 2012-01-02 MED ORDER — CARVEDILOL 25 MG PO TABS: 25.0000 mg | ORAL_TABLET | Freq: Two times a day (BID) | ORAL | Status: DC

## 2012-01-02 NOTE — Progress Notes (Signed)
David Parsons is doing well this am s/p aortic root replacement and aortic valve replacement. He feels well enough to go home. Rhythm stable. Vitals stable. Doing great.   Lenoria Narine 8:01 AM 01/02/2012

## 2012-01-02 NOTE — Progress Notes (Signed)
Ambulated 550 ft using rolling walker on room air,tolerated well,O2 saturation post ambulation is 92%. Alicia Ackert Charity fundraiser.

## 2012-01-02 NOTE — Discharge Summary (Signed)
[address]            [name] [address]          [phone]         Discharge Summary- Addendum  Name: David Parsons DOB: 05-Mar-1941 71 y.o. MRN: [id number]  Admission Date: 12/28/2011 Discharge Date:  01/02/2012   Addendum: Please see previously dictated discharge summary for complete details of his history and early hospital course.  Since the previously dictated discharge summary, the patient has remained stable. He remains in normal sinus rhythm and blood pressures have been controlled. He has been restarted on a low-dose of Corag and his amiodarone dose has been decreased secondary to low heart rate. Also since he is maintaining sinus rhythm, his Coumadin has been discontinued. He has been seen and examined on the date of this dictation and is deemed ready for discharge home.   Recent vital signs:  Filed Vitals:   01/02/12 0600  BP: 138/73  Pulse: 83  Temp: 98.5 F (36.9 C)  Resp: 18    Recent laboratory studies:  CBC: Basename 01/01/12 0920  WBC 12.3*  HGB 10.3*  HCT 31.8*  PLT 193   BMET:  Basename 01/01/12 0920  NA 136  K 3.7  CL 101  CO2 27  GLUCOSE 119*  BUN 21  CREATININE 0.91  CALCIUM 8.5    PT/INR:  Basename 01/02/12 0500  LABPROT 15.6*  INR 1.21    Discharge Medications:   Medication List  As of 01/02/2012  9:31 AM   STOP taking these medications         furosemide 40 MG tablet      potassium chloride SA 20 MEQ tablet      telmisartan-hydrochlorothiazide 80-25 MG per tablet         TAKE these medications         amiodarone 200 MG tablet   Commonly known as: PACERONE   Take 1 tablet (200 mg total) by mouth 2 (two) times daily.      amLODipine 10 MG tablet   Commonly known as: NORVASC   Take 10 mg by mouth daily.      aspirin 81 MG tablet   Take 81 mg by mouth daily.      carvedilol 25 MG tablet   Commonly known as: COREG   Take 1 tablet (25 mg total) by mouth 2 (two) times daily  with a meal.      ferrous sulfate 325 (65 FE) MG tablet   Take 325 mg by mouth 2 (two) times daily before a meal.      hydrALAZINE 50 MG tablet   Commonly known as: APRESOLINE   Take 150 mg by mouth daily.      mulitivitamin with minerals Tabs   Take 1 tablet by mouth daily.      omeprazole 20 MG capsule   Commonly known as: PRILOSEC   Take 1 capsule (20 mg total) by mouth daily. 30 minutes before breakfast      oxyCODONE 5 MG immediate release tablet   Commonly known as: Oxy IR/ROXICODONE   Take 1-2 tablets (5-10 mg total) by mouth every 4 (four) hours as needed.      traMADol 50 MG tablet   Commonly known as: ULTRAM   Take 1-2 tablets (50-100 mg total) by mouth every 4 (four) hours as needed.  Discharge Instructions:  The patient is to refrain from driving, heavy lifting or strenuous activity.  May shower daily and clean incisions with soap and water.  May resume regular diet.  Discharge Orders    Future Appointments: Provider: Department: Dept Phone: Center:   01/22/2012 2:00 PM Purcell Nails, MD [facility] [place] [phone] [facility]   01/24/2012 8:30 AM Edwyna Perfect, MD [facility] [phone] [facility]     Future Orders Please Complete By Expires   Amb Referral to Cardiac Rehabilitation         Follow-up Information    Follow up with Purcell Nails, MD on 01/22/2012. (have a chest x-ray at 1:00, appointment with MD  at 2:00)    Contact information:   [address] [place] [address] [phone]       Follow up with Verne Carrow, MD. Schedule an appointment as soon as possible for a visit in 2 weeks.   Contact information:   [facility] [address] [place] [address] [phone]           Adella Hare 01/02/2012, 9:31 AM

## 2012-01-02 NOTE — Discharge Summary (Signed)
I agree with the above discharge summary and plan for follow-up.  David Parsons H  

## 2012-01-02 NOTE — Progress Notes (Addendum)
5 Days Post-Op Procedure(s) (LRB): CORONARY ARTERY BYPASS GRAFTING (CABG) (N/A) BENTALL PROCEDURE (N/A)  Subjective: David Parsons has no complaints this morning.  He states he feels up to going home.   Objective: Vital signs in last 24 hours: Temp:  [98.4 F (36.9 C)-98.6 F (37 C)] 98.5 F (36.9 C) (05/14 0600) Pulse Rate:  [76-87] 83  (05/14 0600) Cardiac Rhythm:  [-] Heart block (05/13 2000) Resp:  [18] 18  (05/14 0600) BP: (116-138)/(61-75) 138/73 mmHg (05/14 0600) SpO2:  [91 %-96 %] 96 % (05/14 0600) Weight:  [354 lb 11.5 oz (160.9 kg)] 354 lb 11.5 oz (160.9 kg) (05/14 0600)  Intake/Output from previous day: 05/13 0701 - 05/14 0700 In: 480 [P.O.:480] Out: 650 [Urine:650]    General appearance: alert, cooperative and no distress Neurologic: intact Heart: regular rate and rhythm Lungs: clear to auscultation bilaterally Abdomen: soft, non-tender; bowel sounds normal; no masses,  no organomegaly Extremities: edema trace Wound: clean and dry  Lab Results:  Basename 01/01/12 0920  WBC 12.3*  HGB 10.3*  HCT 31.8*  PLT 193   BMET:  Basename 01/01/12 0920  NA 136  K 3.7  CL 101  CO2 27  GLUCOSE 119*  BUN 21  CREATININE 0.91  CALCIUM 8.5    PT/INR:  Basename 01/02/12 0500  LABPROT 15.6*  INR 1.21   ABG    Component Value Date/Time   PHART 7.354 12/29/2011 0156   HCO3 24.2* 12/29/2011 0156   TCO2 26 12/29/2011 1522   ACIDBASEDEF 1.0 12/29/2011 0156   O2SAT 95.0 12/29/2011 0156   CBG (last 3)   Basename 01/02/12 0608 01/02/12 0601 01/01/12 2050  GLUCAP 147* 89 97    Assessment/Plan: S/P Procedure(s) (LRB): CORONARY ARTERY BYPASS GRAFTING (CABG) (N/A) BENTALL PROCEDURE (N/A)  2. CV- previous A.Flutter, has been maintaining NSR for >24 hours, will continue amiodarone, norvasc, and hydralazine.  Will hold Coreg for now due to bradycardia 3. INR 1.21, patient is maintaining NSR, will d/c coumadin 4.Resp- no acute issues, continue IS 5. Dispo- patient is  doing very well, we will d/c EPW and chest tube sutures, if no arrythmia develops will discharge home today.     LOS: 5 days    BARRETT, David Parsons 01/02/2012   I have seen and examined the patient and agree with the assessment and plan as outlined.  However, I would not hold Coreg and would decrease amiodarone.  David Parsons H 01/02/2012 8:07 AM

## 2012-01-02 NOTE — Progress Notes (Signed)
Discharge instructions given to pt and pt's family along with prescriptions. Removed pt's IV, tolerated well no bleeding noted. Pt ready for discharge.

## 2012-01-02 NOTE — Progress Notes (Signed)
Ed completed with pt. Voiced understanding, left materials. [id number] Ethelda Chick CES, ACSM

## 2012-01-02 NOTE — Progress Notes (Signed)
Pt ambulated 750 feet without rolling walker. Tolerated well. Removed chest tube suture and placed steri strips and benzoin. Checked heart rhythm from overnight and no arrhythmias and INR WNL. Removed EPW and pt tolerated well. Small amount of bleeding noted. Pressure held and bleeding stopped. Pt placed on bedrest for one hour with frequent vitals going.

## 2012-01-02 NOTE — Discharge Summary (Signed)
I agree with the above discharge summary and plan for follow-up.  Nicole Defino H  

## 2012-01-05 ENCOUNTER — Telehealth: Payer: Self-pay | Admitting: *Deleted

## 2012-01-05 NOTE — Telephone Encounter (Signed)
David Parsons is concerned with his ankle swelling, the evh harvest leg more involved.  She said elevation helps some, but not much. I suggested he resume his pre op Lasix and K...but, just Lasix 40mg m and K daily for now.....she agrees.

## 2012-01-08 ENCOUNTER — Ambulatory Visit: Payer: Medicare Other | Admitting: Thoracic Surgery (Cardiothoracic Vascular Surgery)

## 2012-01-16 ENCOUNTER — Ambulatory Visit (INDEPENDENT_AMBULATORY_CARE_PROVIDER_SITE_OTHER): Payer: Medicare Other | Admitting: Cardiovascular Disease

## 2012-01-16 ENCOUNTER — Encounter: Payer: Self-pay | Admitting: Cardiovascular Disease

## 2012-01-16 VITALS — BP 108/68 | HR 70 | Ht 73.0 in | Wt 321.0 lb

## 2012-01-16 DIAGNOSIS — I712 Thoracic aortic aneurysm, without rupture: Secondary | ICD-10-CM

## 2012-01-16 MED ORDER — AMLODIPINE BESYLATE 5 MG PO TABS: 5.0000 mg | ORAL_TABLET | Freq: Every day | ORAL | Status: DC

## 2012-01-16 NOTE — Progress Notes (Signed)
History of Present Illness: 71 yo AAM with history of NICM, dilated aortic root, moderate AI, non-obstrucitve CAD, morbid obesity, obstructive sleep apnea, HTN and GERD who was referred in May 2010 for SOB, abdominal swelling and LE edema. He has since been diagnosed with a non-ischemic CM, moderate AI, non-obstructive CAD and a dilated aortic root. He has undergone a right and left heart cathJune 2010 with non-obstructive coronary artery disease. A CT angiogram was performed in June 2010 to look closely at his aortic root and showed moderate dilation of the aortic root (root 4.6 cm, ascending aorta 3.8cm). It is felt that his aortic insufficiency is likely related to the aortic root dilation. His LV is mildly dilated but LV function is now normal. Echo in the spring of 2011 showed enlarging aortic root. I planned on performing an MRI to look at his valve, degree of AI and size of root but he was too large for the scanner. He has been followed by Dr. Cornelius Moras with CVTS. Most recent Chest CTA showed that his aortic root has enlarged. Based on this, he was referred back to Dr. Cornelius Moras mid March and plans are being made for aortic root replacement. I saw him 11/13/11 and arranged a cardiac cath on 11/22/11 which showed enlarged root, no obstructive disease in the left system. I could not engage the RCA secondary to the abnormal takeoff with the enlarged aortic root. CTA was performed and showed dilated aortic root with patent RCA. He underwent aortic root replacement, aortic valve replacement and SVG to RCA on 12/28/11. He did well following the operation. He is here today for follow up.   He has been dizzy at home several times. No syncope. Poor po intake. Feels abdominal bloating. Belching a lot. Some pain in right arm but has good use of right hand. Hands not cold. No chest pain or SOB.   Primary Care Physician: [place]   Past Medical History  Diagnosis Date  . Hypertension   . Aortic insufficiency      02/16/09  Chest CT:  The aortic root has a diameter measuring 4.6 cm, image 64 of the coronal series.  The ascending    thoracic aorta has a diameter of 3.8 cm, image 65 of the coronal series.  At the level of the aortic arch    the thoracic aorta has a maximum diameter of 3.2 cm.  The descending thoracic aorta has a maximum           diameter of 3.2 cm.  There is no evidence for aortic disse        . GERD (gastroesophageal reflux disease)   . Hypokalemia   . Helicobacter pylori gastritis     (Tx Pylera 4/10)  . Cardiomyopathy      01/2009 - Cath - nonobs. dzs.  EF 35%.  . Gallstones     s/p cholecystectomy    . Sleep apnea     occasionally wears CPAP at night  . CAD (coronary artery disease)   . Aortic root enlargement     The aortic root has a diameter measuring 4.6 cm, image 64 of the coronal series.  The ascending  thoracic aorta has a diameter of 3.8 cm, image 65 of the coronal series.  At the level of the aortic arch   the thoracic aorta has a maximum diameter of 3.2 cm.  The descending thoracic aorta has a maximum   diameter of 3.2 cm.  There is no evidence for aortic  dissection.       . Iron deficiency anemia   . CHF (congestive heart failure)   . Obesity, Class III, BMI 40-49.9 (morbid obesity)   . Heart murmur   . PONV (postoperative nausea and vomiting)   . 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 Diabetes mellitus     Past Surgical History  Procedure Date  . Cholecystectomy   . Adrenalectomy   . Colonoscopy w/ biopsies and polypectomy 11/04/2008    colon polyps, diverticulosis   . Upper gastrointestinal endoscopy 11/04/2008    w/bx, H pylori gastritis  . Upper gastrointestinal endoscopy 09/25/2011  . Colonoscopy 09/25/2011  . Multiple tooth  extractions   . Tee without cardioversion 11/22/2011    Procedure: TRANSESOPHAGEAL ECHOCARDIOGRAM (TEE);  Surgeon: Laurey Morale, MD;  Location: [facility] ENDOSCOPY;  Service: Cardiovascular;  Laterality: N/A;  . Cardiac catheterization     2013  . Cardiac valve replacement 12/28/2011    Bentall with tissue valve  . Coronary artery bypass graft 12/28/2011    Procedure: CORONARY ARTERY BYPASS GRAFTING (CABG);  Surgeon: Purcell Nails, MD;  Location: [facility] OR;  Service: Open Heart Surgery;  Laterality: N/A;  cabg x 1  . Bentall procedure 12/28/2011    Procedure: BENTALL PROCEDURE;  Surgeon: Purcell Nails, MD;  Location: [facility] OR;  Service: Open Heart Surgery;  Laterality: N/A;    Current Outpatient Prescriptions  Medication Sig Dispense Refill  . amiodarone (PACERONE) 200 MG tablet Take 1 tablet (200 mg total) by mouth 2 (two) times daily.  60 tablet  1  . amLODipine (NORVASC) 10 MG tablet Take 10 mg by mouth daily.      Marland Kitchen aspirin 81 MG tablet Take 81 mg by mouth daily.        . carvedilol (COREG) 25 MG tablet Take 1 tablet (25 mg total) by mouth 2 (two) times daily with a meal.  60 tablet  1  . ferrous sulfate 325 (65 FE) MG tablet Take 325 mg by mouth 2 (two) times daily before a meal.      . hydrALAZINE (APRESOLINE) 50 MG tablet Take 150 mg by mouth daily.       . Multiple Vitamin (MULITIVITAMIN WITH MINERALS) TABS Take 1 tablet by mouth daily.      Marland Kitchen omeprazole (PRILOSEC) 20 MG capsule Take 1 capsule (20 mg total) by mouth daily. 30 minutes before breakfast  30 capsule  11    Allergies  Allergen Reactions  . Latex Hives    Latex gloves     History   Social History  . Marital Status: Married    Spouse Name: Steward Drone    Number of Children: 0  . Years of Education: N/A   Occupational History  .      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 Smoking status: Never Smoker   . Smokeless tobacco: Never Used  . Alcohol Use: No  . Drug Use: No  . Sexually Active: Not  on file   Other Topics Concern  . Not on file   Social History Narrative   The patient is married, lives with his wife in  [place].  He works as a Programmer, multimedia and runs a Education officer, environmental  business.  He  lives a very sedentary lifestyle.  He is a nonsmoker.  He denies alcohol consumption.     Family History  Problem Relation Age of Onset  . Breast cancer Sister   . Colon cancer Brother 70    at least 70  . Prostate cancer Brother   . Diabetes Sister     5 siblings  . Stomach cancer Sister   . Hypertension Mother   . Kidney failure Sister   . Colon cancer Sister   . Breast cancer Sister   . Stroke Father     Review of Systems:  As stated in the HPI and otherwise negative.   BP 108/68  Pulse 70  Ht 6\' 1"  (1.854 m)  Wt 321 lb (145.605 kg)  BMI 42.35 kg/m2  Physical Examination: General: Well developed, well nourished, NAD HEENT: OP clear, mucus membranes moist SKIN: warm, dry. No rashes. Neuro: No focal deficits Musculoskeletal: Muscle strength 5/5 all ext Psychiatric: Mood and affect normal Neck: No JVD, no carotid bruits, no thyromegaly, no lymphadenopathy. Lungs:Clear bilaterally, no wheezes, rhonci, crackles Cardiovascular: Regular rate and rhythm. Systolic murmur noted. No gallops or rubs. Abdomen:Soft. Bowel sounds present. Non-tender.  Extremities: 1+ right lower extremity edema. No left lower ext edema. Pulses are 2 + in the bilateral DP/PT.  EKG: NSR with 1st degree AV block. RBBB. Inferior Q waves.

## 2012-01-16 NOTE — Patient Instructions (Signed)
Your physician recommends that you schedule a follow-up appointment in: 3 months.   Your physician has recommended you make the following change in your medication: Stop amiodarone.  Decrease Norvasc (amlodipine) to 5 mg by mouth daily.  Chest X-ray will be done on June 3rd at [facility] at 1:00.  Same building as Dr. Orvan July office.

## 2012-01-16 NOTE — Assessment & Plan Note (Addendum)
S/p replacement of aortic root and AVR with one vessel bypass, SVG to RCA. He is doing well considering he is 19 days out from surgery. His BP is soft. Will lower amlodipine to 5 mg po Qdaily. Increase fluid intake. Will d/c amiodarone since he is in sinus and is feeling weak. Echo will be planned based on recs of Dr. Cornelius Moras. We can arrange this at any time.

## 2012-01-17 ENCOUNTER — Other Ambulatory Visit: Payer: Self-pay | Admitting: *Deleted

## 2012-01-17 DIAGNOSIS — I712 Thoracic aortic aneurysm, without rupture: Secondary | ICD-10-CM

## 2012-01-17 LAB — BASIC METABOLIC PANEL
GFR: 70.2 mL/min (ref >60.00)
Glucose, Bld: 83 mg/dL (ref 70–99)
Potassium: 3.3 mEq/L — ABNORMAL LOW (ref 3.5–5.1)
Sodium: 139 mEq/L (ref 135–145)

## 2012-01-17 LAB — CBC WITH DIFFERENTIAL/PLATELET
Basophils Relative: 1.7 % (ref 0.0–3.0)
Eosinophils Relative: 3.9 % (ref 0.0–5.0)
HCT: 35.6 % — ABNORMAL LOW (ref 39.0–52.0)
Hemoglobin: 11.3 g/dL — ABNORMAL LOW (ref 13.0–17.0)
Lymphs Abs: 1.4 10*3/uL (ref 0.7–4.0)
Monocytes Relative: 11.7 % (ref 3.0–12.0)
Neutro Abs: 3.7 10*3/uL (ref 1.4–7.7)
RBC: 3.86 Mil/uL — ABNORMAL LOW (ref 4.22–5.81)
RDW: 15 % — ABNORMAL HIGH (ref 11.5–14.6)
WBC: 6.2 10*3/uL (ref 4.5–10.5)

## 2012-01-17 MED ORDER — POTASSIUM CHLORIDE CRYS ER 20 MEQ PO TBCR: 20.0000 meq | EXTENDED_RELEASE_TABLET | Freq: Two times a day (BID) | ORAL | Status: DC

## 2012-01-18 ENCOUNTER — Telehealth: Payer: Self-pay | Admitting: Cardiovascular Disease

## 2012-01-18 NOTE — Telephone Encounter (Signed)
Wife called to verify that pt was to take 60 mEq potassium yesterday and  40 mEq daily thereafter. Pt's wife verbalized understanding.

## 2012-01-18 NOTE — Telephone Encounter (Signed)
Please return call to patient wife Steward Drone [phone] concerning potassium med instructions.

## 2012-01-19 ENCOUNTER — Other Ambulatory Visit: Payer: Self-pay | Admitting: Thoracic Surgery (Cardiothoracic Vascular Surgery)

## 2012-01-19 DIAGNOSIS — I2510 Atherosclerotic heart disease of native coronary artery without angina pectoris: Secondary | ICD-10-CM

## 2012-01-22 ENCOUNTER — Ambulatory Visit (INDEPENDENT_AMBULATORY_CARE_PROVIDER_SITE_OTHER): Payer: Self-pay | Admitting: Thoracic Surgery (Cardiothoracic Vascular Surgery)

## 2012-01-22 ENCOUNTER — Ambulatory Visit
Admission: RE | Admit: 2012-01-22 | Discharge: 2012-01-22 | Disposition: A | Discharge status: Home / Self Care | Payer: Medicare Other | Source: Ambulatory Visit | Attending: Thoracic Surgery (Cardiothoracic Vascular Surgery) | Admitting: Thoracic Surgery (Cardiothoracic Vascular Surgery)

## 2012-01-22 ENCOUNTER — Encounter: Payer: Self-pay | Admitting: Thoracic Surgery (Cardiothoracic Vascular Surgery)

## 2012-01-22 VITALS — BP 117/77 | HR 73 | Resp 16 | Ht 74.0 in | Wt 316.0 lb

## 2012-01-22 DIAGNOSIS — I2510 Atherosclerotic heart disease of native coronary artery without angina pectoris: Secondary | ICD-10-CM

## 2012-01-22 DIAGNOSIS — I359 Nonrheumatic aortic valve disorder, unspecified: Secondary | ICD-10-CM

## 2012-01-22 DIAGNOSIS — Z09 Encounter for follow-up examination after completed treatment for conditions other than malignant neoplasm: Secondary | ICD-10-CM | POA: Insufficient documentation

## 2012-01-22 DIAGNOSIS — I712 Thoracic aortic aneurysm, without rupture: Secondary | ICD-10-CM

## 2012-01-22 DIAGNOSIS — Z951 Presence of aortocoronary bypass graft: Secondary | ICD-10-CM | POA: Diagnosis not present

## 2012-01-22 NOTE — Patient Instructions (Signed)
The patient has been reminded to continue to avoid any heavy lifting or strenuous use of arms or shoulders for at least a total of three months from the time of surgery. The patient has been instructed that they may return driving an automobile as long as they are no longer requiring oral narcotic pain relievers during the daytime.  They have been advised to start driving short distances during the daylight and gradually increase from there as they feel comfortable.  

## 2012-01-22 NOTE — Progress Notes (Signed)
[address]            [name] [address]          [phone]     CARDIOTHORACIC SURGERY OFFICE NOTE  Referring Provider is Kathleene Hazel* PCP is Letitia Libra, Ala Dach, MD, MD   HPI:  Patient returns for routine followup status post biological Bentall aortic root replacement and coronary artery bypass grafting x1 on 12/28/2011. His postoperative recovery has been remarkably uncomplicated. He was seen in followup last week by Dr. Clifton James. Amiodarone was discontinued and his dose of amlodipine was decreased because his blood pressure was running somewhat low. The patient has continued to do well and returns to our office for routine followup today. He reports at fully no problems. He has had minimal pain in his chest and he is really not been taking any pain relievers. He denies any shortness of breath and in fact he states that his breathing is better than it was prior to surgery. He is sleeping fairly well at night. He still gets tired easily and his appetite is not very good, but all this seems to be slowly improving. He plans to start the cardiac rehabilitation program next week.   Current Outpatient Prescriptions  Medication Sig Dispense Refill  . amLODipine (NORVASC) 5 MG tablet Take 1 tablet (5 mg total) by mouth daily.  30 tablet  6  . aspirin 81 MG tablet Take 81 mg by mouth daily.        . carvedilol (COREG) 25 MG tablet Take 1 tablet (25 mg total) by mouth 2 (two) times daily with a meal.  60 tablet  1  . ferrous sulfate 325 (65 FE) MG tablet Take 325 mg by mouth 2 (two) times daily before a meal.      . furosemide (LASIX) 40 MG tablet Take 40 mg by mouth daily.      . Multiple Vitamin (MULITIVITAMIN WITH MINERALS) TABS Take 1 tablet by mouth daily.      Marland Kitchen omeprazole (PRILOSEC) 20 MG capsule Take 1 capsule (20 mg total) by mouth daily. 30 minutes before breakfast  30 capsule  11  . oxycodone (OXY-IR) 5 MG capsule Take 5 mg by  mouth every 4 (four) hours as needed.      . potassium chloride SA (K-DUR,KLOR-CON) 20 MEQ tablet Take 1 tablet (20 mEq total) by mouth 2 (two) times daily.  60 tablet  6  . traMADol (ULTRAM) 50 MG tablet Take 50 mg by mouth every 6 (six) hours as needed.          Physical Exam:   BP 117/77  Pulse 73  Resp 16  Ht 6\' 2"  (1.88 m)  Wt 316 lb (143.337 kg)  BMI 40.57 kg/m2  SpO2 95%  General:  Well-appearing  Chest:   Clear to auscultation  CV:   Regular rate and rhythm without murmur  Incisions:  Clean and dry and healing nicely, sternum is stable  Abdomen:  Soft and nontender  Extremities:  Warm and well-perfused  Diagnostic Tests:  *RADIOLOGY REPORT*  Clinical Data: 3-week follow-up for CABG.  CHEST - 2 VIEW  Comparison: 12/30/2011  Findings: Prior median sternotomy. Status post aortic valve  replacement. Midline trachea. Mild cardiomegaly. No pleural  effusion or pneumothorax. Left greater than right bibasilar volume  loss is slightly improved. Improved inspiratory effort. No  congestive failure.  IMPRESSION:  1. No acute cardiopulmonary disease.  2. Improved bibasilar aeration with left greater than right  atelectasis remaining.  Original Report Authenticated By: Consuello Bossier, M.D.    Impression:  Patient is doing remarkably well following Bentall aortic root replacement using a bioprosthetic tissue valve and single-vessel coronary artery bypass grafting.  Plan:  I've encouraged patient to continue to gradually increase his physical activity as tolerated with his primary limitation remaining that he refrain from heavy lifting or strenuous use of his arms or shoulders for least another 2 months. I think he can resume driving an automobile. We have not made any changes to his current medications. I've encouraged him to participate in the cardiac rehabilitation program. All of his questions been addressed.    Salvatore Decent. Cornelius Moras, MD 01/22/2012 2:42 PM

## 2012-01-23 ENCOUNTER — Telehealth: Payer: Self-pay | Admitting: Cardiovascular Disease

## 2012-01-23 NOTE — Telephone Encounter (Signed)
Please return call to patient wife Steward Drone [phone]  Patient had open heart surgery about 4 wks ago, patient c/o feeling weak, No SOB or dizziness at this time.  Steward Drone thinks this may be associated with all the medication changes.  Please return call to patient wife to advise.

## 2012-01-23 NOTE — Telephone Encounter (Signed)
Spoke with pt's wife who states pt is feeling weak and sleepy with little energy.  Pt saw Dr. Cornelius Moras yesterday. Wife states weakness has been ongoing since surgery but she feels he is weaker today.  No chest pain, no shortness of breath. Poor appetite. Some dizziness this AM but resolved on own. Does not check blood pressure at home but blood pressure was 117/77 and heart rate 73 at office visit yesterday.  She is wondering if his potassium is low.  Is taking K-Dur as ordered--20 meq twice daily.  I talked with wife about recovery from surgery and how weakness is expected and it takes time to regain strength.  I told her I would send note to Dr. Clifton James for his recommendations.

## 2012-01-24 ENCOUNTER — Ambulatory Visit: Payer: Medicare Other | Admitting: Internal Medicine

## 2012-01-25 ENCOUNTER — Encounter (HOSPITAL_COMMUNITY)
Admission: RE | Admit: 2012-01-25 | Discharge: 2012-01-25 | Disposition: A | Discharge status: Home / Self Care | Payer: Medicare Other | Source: Ambulatory Visit | Attending: Cardiovascular Disease | Admitting: Cardiovascular Disease

## 2012-01-25 ENCOUNTER — Telehealth: Payer: Self-pay | Admitting: Cardiovascular Disease

## 2012-01-25 DIAGNOSIS — I428 Other cardiomyopathies: Secondary | ICD-10-CM | POA: Insufficient documentation

## 2012-01-25 DIAGNOSIS — I712 Thoracic aortic aneurysm, without rupture, unspecified: Secondary | ICD-10-CM | POA: Insufficient documentation

## 2012-01-25 DIAGNOSIS — G4730 Sleep apnea, unspecified: Secondary | ICD-10-CM | POA: Insufficient documentation

## 2012-01-25 DIAGNOSIS — Z951 Presence of aortocoronary bypass graft: Secondary | ICD-10-CM | POA: Insufficient documentation

## 2012-01-25 DIAGNOSIS — I2510 Atherosclerotic heart disease of native coronary artery without angina pectoris: Secondary | ICD-10-CM | POA: Insufficient documentation

## 2012-01-25 DIAGNOSIS — K219 Gastro-esophageal reflux disease without esophagitis: Secondary | ICD-10-CM | POA: Insufficient documentation

## 2012-01-25 DIAGNOSIS — I359 Nonrheumatic aortic valve disorder, unspecified: Secondary | ICD-10-CM | POA: Insufficient documentation

## 2012-01-25 DIAGNOSIS — I4892 Unspecified atrial flutter: Secondary | ICD-10-CM | POA: Insufficient documentation

## 2012-01-25 DIAGNOSIS — Z5189 Encounter for other specified aftercare: Secondary | ICD-10-CM | POA: Insufficient documentation

## 2012-01-25 DIAGNOSIS — I509 Heart failure, unspecified: Secondary | ICD-10-CM | POA: Insufficient documentation

## 2012-01-25 DIAGNOSIS — I10 Essential (primary) hypertension: Secondary | ICD-10-CM | POA: Insufficient documentation

## 2012-01-25 DIAGNOSIS — Z954 Presence of other heart-valve replacement: Secondary | ICD-10-CM | POA: Insufficient documentation

## 2012-01-25 DIAGNOSIS — I4891 Unspecified atrial fibrillation: Secondary | ICD-10-CM | POA: Insufficient documentation

## 2012-01-25 NOTE — Progress Notes (Signed)
Cardiac Rehab Medication Review by a Pharmacist  Does the patient  feel that his/her medications are working for him/her?  yes  Has the patient been experiencing any side effects to the medications prescribed?  Yes, states he has been having fatigue and dizziness.  Patient also states that his MD is already aware of this, encouraged patient to continue to keep MD aware of these symptoms and continue MD follow-up regarding this.    Does the patient measure his/her own blood pressure or blood glucose at home?  no   Does the patient have any problems obtaining medications due to transportation or finances?   no  Understanding of regimen: fair Understanding of indications: fair Potential of compliance: good   David Parsons 01/25/2012 9:26 AM

## 2012-01-25 NOTE — Telephone Encounter (Signed)
Dr Clifton James called pt today and pt's wife calling back to let him know he is doing better now

## 2012-01-25 NOTE — Telephone Encounter (Signed)
Left message on patient's machine at home. He is asked to call us back to let us know how he is feeling. cdm

## 2012-01-25 NOTE — Telephone Encounter (Signed)
Wife states his weakness is better, wanted to let Dr Clifton James know.

## 2012-01-29 ENCOUNTER — Encounter (HOSPITAL_COMMUNITY)
Admission: RE | Admit: 2012-01-29 | Discharge: 2012-01-29 | Disposition: A | Discharge status: Home / Self Care | Payer: Medicare Other | Source: Ambulatory Visit | Attending: Cardiovascular Disease | Admitting: Cardiovascular Disease

## 2012-01-29 ENCOUNTER — Telehealth: Payer: Self-pay | Admitting: *Deleted

## 2012-01-29 DIAGNOSIS — I712 Thoracic aortic aneurysm, without rupture: Secondary | ICD-10-CM

## 2012-01-29 DIAGNOSIS — I2510 Atherosclerotic heart disease of native coronary artery without angina pectoris: Secondary | ICD-10-CM | POA: Diagnosis not present

## 2012-01-29 DIAGNOSIS — Z954 Presence of other heart-valve replacement: Secondary | ICD-10-CM | POA: Diagnosis not present

## 2012-01-29 DIAGNOSIS — Z5189 Encounter for other specified aftercare: Secondary | ICD-10-CM | POA: Diagnosis not present

## 2012-01-29 DIAGNOSIS — I4891 Unspecified atrial fibrillation: Secondary | ICD-10-CM | POA: Diagnosis not present

## 2012-01-29 DIAGNOSIS — I10 Essential (primary) hypertension: Secondary | ICD-10-CM | POA: Diagnosis not present

## 2012-01-29 DIAGNOSIS — Z951 Presence of aortocoronary bypass graft: Secondary | ICD-10-CM | POA: Diagnosis not present

## 2012-01-29 DIAGNOSIS — I4892 Unspecified atrial flutter: Secondary | ICD-10-CM | POA: Diagnosis not present

## 2012-01-29 DIAGNOSIS — I509 Heart failure, unspecified: Secondary | ICD-10-CM | POA: Diagnosis not present

## 2012-01-29 DIAGNOSIS — I428 Other cardiomyopathies: Secondary | ICD-10-CM | POA: Diagnosis not present

## 2012-01-29 DIAGNOSIS — G4730 Sleep apnea, unspecified: Secondary | ICD-10-CM | POA: Diagnosis not present

## 2012-01-29 DIAGNOSIS — K219 Gastro-esophageal reflux disease without esophagitis: Secondary | ICD-10-CM | POA: Diagnosis not present

## 2012-01-29 DIAGNOSIS — I359 Nonrheumatic aortic valve disorder, unspecified: Secondary | ICD-10-CM | POA: Diagnosis not present

## 2012-01-29 NOTE — Progress Notes (Addendum)
David Parsons came to cardiac rehab for his first day of exercise at cardiac rehab.  Patient complained of feeling lightheaded on the nustep and while using 2 lb weights. Blood  pressure 86/62.  Patient given water and Gatorade.  Orthostatic blood pressure checked. Lying blood pressure 103/68.  Sitting blood pressure 97/67 Standing blood pressure 95/67. David Parsons says he drank an ensure for breakfast but has not had much of an appetite. Telemetry rhythm sinus with a first degree heart block and a bundle branch block which is previously documented .  Patient given graham crackers and lemonade and encouraged the patient to eat before coming to exercise.  Velvet Bathe Dr McAlhany's nurse called and notified of events.  David Parsons left without further complaints. . Will send exercise flow sheets for review via media manager for Dr Clifton James to review. Will continue to monitor the patient throughout  the program. Patient instructed not to come to exercise if he feels lightheaded. Patient has a whitish brown coating on his tongue. Will notify Dr Clifton James via epic.

## 2012-01-29 NOTE — Telephone Encounter (Signed)
Spoke with pt's wife and gave her instructions from Dr. Clifton James. Wife states hydralazine was previously stopped.  She will have pt stop Norvasc.  He is OK to exercise at cardiac rehab. Pt needs to have BMP this week. He will stop by office after cardiac rehab on Wednesday for labs.  I asked wife to contact primary MD regarding white areas in mouth.

## 2012-01-29 NOTE — Telephone Encounter (Signed)
Received phone call from Gladstone Lighter, RN at cardiac rehab. Per Byrd Hesselbach pt was at first session of cardiac rehab today and complained of feeling light-headed with change in position and with poor appetite. Monitor shows heart rate in 80's--SR with bundle branch block and first degree heart block.  Orthostatic blood pressures--lying 103/68, sitting 97/67, standing 95/67.  Lowest blood pressure was 86/62 while doing weights. Blood pressure at end of session in the 90's. Byrd Hesselbach will send blood pressure readings via EPIC. States pt is drinking ensure and has white area on tongue. She will have pt contact primary MD regarding area on tongue.

## 2012-01-29 NOTE — Telephone Encounter (Signed)
See phone note dated January 25, 2012 for documentation of call back from wife indicating pt was feeling better

## 2012-01-29 NOTE — Telephone Encounter (Signed)
David Parsons, He takes 3 Hydralazine tabs for total of 150 mg. Can we have him lower that to one 50 mg Hydralazine tablet per day? Also, hold his Norvasc tablet until further notice.  Thanks, chris

## 2012-01-30 ENCOUNTER — Telehealth: Payer: Self-pay | Admitting: Internal Medicine

## 2012-01-30 MED ORDER — NYSTATIN 100000 UNIT/ML MT SUSP: OROMUCOSAL | Status: AC

## 2012-01-30 NOTE — Telephone Encounter (Signed)
Rx sent to pharmacy. Patient's wife advised per Dr Rodena Medin instructions, and has verbalized understanding.

## 2012-01-30 NOTE — Telephone Encounter (Signed)
Nystatin swish and swallow 5ml po qid for 7d. Followup if no improvement or worsening.

## 2012-01-30 NOTE — Telephone Encounter (Signed)
Patient had surgery ,last week and now has a rash on his tongue wants to know if something can be called in  for him.  He had heart surgery and was told by his Cardiologist that his medical doctor would have to call med in.

## 2012-01-30 NOTE — Telephone Encounter (Signed)
Call placed to patient at [phone]. He stated that he has a  White past on his tongue, has no taste, no appetite. He stated that he has tried to brush off, with no success. He stated that this has been ongoing for the past month. He is post op open heart surgery and is currently in rehab. He would like to know If Dr Rodena Medin would provide a Rx for him for what he believes to be is thrush.

## 2012-01-31 ENCOUNTER — Encounter (HOSPITAL_COMMUNITY)
Admission: RE | Admit: 2012-01-31 | Discharge: 2012-01-31 | Disposition: A | Discharge status: Home / Self Care | Payer: Medicare Other | Source: Ambulatory Visit | Attending: Cardiovascular Disease | Admitting: Cardiovascular Disease

## 2012-01-31 ENCOUNTER — Other Ambulatory Visit: Payer: Self-pay | Admitting: *Deleted

## 2012-01-31 ENCOUNTER — Other Ambulatory Visit (INDEPENDENT_AMBULATORY_CARE_PROVIDER_SITE_OTHER): Payer: Medicare Other

## 2012-01-31 DIAGNOSIS — I712 Thoracic aortic aneurysm, without rupture, unspecified: Secondary | ICD-10-CM

## 2012-01-31 LAB — BASIC METABOLIC PANEL
BUN: 12 mg/dL (ref 6–23)
CO2: 29 mEq/L (ref 19–32)
Chloride: 96 mEq/L (ref 96–112)
Creatinine, Ser: 1.2 mg/dL (ref 0.4–1.5)
Glucose, Bld: 87 mg/dL (ref 70–99)
Potassium: 3 mEq/L — ABNORMAL LOW (ref 3.5–5.1)

## 2012-01-31 MED ORDER — POTASSIUM CHLORIDE CRYS ER 20 MEQ PO TBCR: 40.0000 meq | EXTENDED_RELEASE_TABLET | Freq: Two times a day (BID) | ORAL | Status: DC

## 2012-01-31 NOTE — Progress Notes (Addendum)
David Parsons did not complain of dizziness this morning during exercise. Vital signs stable. Dr Clifton James discontinued David. Parker's amlodipine. David Lacko was started on Nystatin suspension for the coating on his tongue.  The patient said his appetite is a little better.  Will continue to monitor the patient.

## 2012-02-01 ENCOUNTER — Telehealth (HOSPITAL_COMMUNITY): Payer: Self-pay | Admitting: *Deleted

## 2012-02-02 ENCOUNTER — Encounter (HOSPITAL_COMMUNITY): Payer: Medicare Other

## 2012-02-03 ENCOUNTER — Other Ambulatory Visit: Payer: Self-pay | Admitting: Internal Medicine

## 2012-02-05 ENCOUNTER — Encounter (HOSPITAL_COMMUNITY): Payer: Medicare Other

## 2012-02-05 NOTE — Telephone Encounter (Signed)
Rx refill sent to pharmacy. 

## 2012-02-07 ENCOUNTER — Encounter (HOSPITAL_COMMUNITY): Admission: RE | Admit: 2012-02-07 | Payer: Medicare Other | Source: Ambulatory Visit

## 2012-02-07 ENCOUNTER — Other Ambulatory Visit (INDEPENDENT_AMBULATORY_CARE_PROVIDER_SITE_OTHER): Payer: Medicare Other

## 2012-02-07 DIAGNOSIS — I712 Thoracic aortic aneurysm, without rupture, unspecified: Secondary | ICD-10-CM

## 2012-02-07 LAB — BASIC METABOLIC PANEL
BUN: 8 mg/dL (ref 6–23)
CO2: 27 mEq/L (ref 19–32)
Calcium: 9 mg/dL (ref 8.4–10.5)
GFR: 90.81 mL/min (ref >60.00)
Glucose, Bld: 112 mg/dL — ABNORMAL HIGH (ref 70–99)

## 2012-02-09 ENCOUNTER — Telehealth (HOSPITAL_COMMUNITY): Payer: Self-pay | Admitting: *Deleted

## 2012-02-09 ENCOUNTER — Encounter (HOSPITAL_COMMUNITY): Admission: RE | Admit: 2012-02-09 | Payer: Medicare Other | Source: Ambulatory Visit

## 2012-02-09 NOTE — Telephone Encounter (Signed)
Received lab results.  Potassium level 4.3.  Pt able to resume exercise on Monday.

## 2012-02-12 ENCOUNTER — Encounter (HOSPITAL_COMMUNITY)
Admission: RE | Admit: 2012-02-12 | Discharge: 2012-02-12 | Disposition: A | Discharge status: Home / Self Care | Payer: Medicare Other | Source: Ambulatory Visit | Attending: Cardiovascular Disease | Admitting: Cardiovascular Disease

## 2012-02-12 NOTE — Progress Notes (Signed)
Mr. Gilbert returned to exercise today felt better today during exercise.  Mr. Dalton weight was 144.2 kg today  Which is up 3.7 kg from  his last exercise session on 01/31/2012.  Mr Reither is taking his Furosemide every other day.  Mr Lanese's furosemide is prescribed 40 mg once a day.  Mr. Schreifels said he cut back on his diuretic because he felt it attributed to his dizziness.  Patient instructed to consult with the  prescribing physician before altering his medications.  Mr Shouse denied SOB or edema today with exercise.  Lung fields clear upon auscultation A and P. Mr Beshears's say's his appetite has improved and his tongue does not have the coating that it had before.  Dr Gibson Ramp nurse Dennie Bible called and notified of the patients weight gain.  . Will send exercise flow sheets for review via media manager for Dr Clifton James to review.

## 2012-02-13 ENCOUNTER — Telehealth: Payer: Self-pay | Admitting: *Deleted

## 2012-02-13 DIAGNOSIS — I712 Thoracic aortic aneurysm, without rupture: Secondary | ICD-10-CM

## 2012-02-13 MED ORDER — FUROSEMIDE 40 MG PO TABS: 40.0000 mg | ORAL_TABLET | Freq: Every day | ORAL | Status: DC

## 2012-02-13 NOTE — Telephone Encounter (Signed)
Spoke with pt and gave him instructions from Dr. Clifton James. He will make med changes and come in February 23, 2012 for [facility]

## 2012-02-13 NOTE — Telephone Encounter (Signed)
Dr. Clifton James received note from Cardiac Rehab that pt's weight was up 3.7 kg upon return to rehab after an 11 day absence. Pt was taking furosemide every other day.  Dr. Clifton James would like pt to increase Lasix to 40 mg once daily and recheck BMP in 10 days.

## 2012-02-13 NOTE — Telephone Encounter (Signed)
Follow-up:    Patient returned your call.  Please call back. 

## 2012-02-13 NOTE — Telephone Encounter (Signed)
Left message to call back  

## 2012-02-14 ENCOUNTER — Encounter (HOSPITAL_COMMUNITY)
Admission: RE | Admit: 2012-02-14 | Discharge: 2012-02-14 | Disposition: A | Discharge status: Home / Self Care | Payer: Medicare Other | Source: Ambulatory Visit | Attending: Cardiovascular Disease | Admitting: Cardiovascular Disease

## 2012-02-16 ENCOUNTER — Encounter (HOSPITAL_COMMUNITY)
Admission: RE | Admit: 2012-02-16 | Discharge: 2012-02-16 | Disposition: A | Discharge status: Home / Self Care | Payer: Medicare Other | Source: Ambulatory Visit | Attending: Cardiovascular Disease | Admitting: Cardiovascular Disease

## 2012-02-19 ENCOUNTER — Encounter (HOSPITAL_COMMUNITY)
Admission: RE | Admit: 2012-02-19 | Discharge: 2012-02-19 | Disposition: A | Discharge status: Home / Self Care | Payer: Medicare Other | Source: Ambulatory Visit | Attending: Cardiovascular Disease | Admitting: Cardiovascular Disease

## 2012-02-19 DIAGNOSIS — I428 Other cardiomyopathies: Secondary | ICD-10-CM | POA: Diagnosis not present

## 2012-02-19 DIAGNOSIS — G4730 Sleep apnea, unspecified: Secondary | ICD-10-CM | POA: Insufficient documentation

## 2012-02-19 DIAGNOSIS — Z954 Presence of other heart-valve replacement: Secondary | ICD-10-CM | POA: Diagnosis not present

## 2012-02-19 DIAGNOSIS — I4891 Unspecified atrial fibrillation: Secondary | ICD-10-CM | POA: Diagnosis not present

## 2012-02-19 DIAGNOSIS — I2510 Atherosclerotic heart disease of native coronary artery without angina pectoris: Secondary | ICD-10-CM | POA: Insufficient documentation

## 2012-02-19 DIAGNOSIS — Z5189 Encounter for other specified aftercare: Secondary | ICD-10-CM | POA: Insufficient documentation

## 2012-02-19 DIAGNOSIS — Z951 Presence of aortocoronary bypass graft: Secondary | ICD-10-CM | POA: Insufficient documentation

## 2012-02-19 DIAGNOSIS — I359 Nonrheumatic aortic valve disorder, unspecified: Secondary | ICD-10-CM | POA: Insufficient documentation

## 2012-02-19 DIAGNOSIS — I509 Heart failure, unspecified: Secondary | ICD-10-CM | POA: Diagnosis not present

## 2012-02-19 DIAGNOSIS — K219 Gastro-esophageal reflux disease without esophagitis: Secondary | ICD-10-CM | POA: Diagnosis not present

## 2012-02-19 DIAGNOSIS — I4892 Unspecified atrial flutter: Secondary | ICD-10-CM | POA: Diagnosis not present

## 2012-02-19 NOTE — Progress Notes (Signed)
Intermittent nonsustained PVC's noted today during cool down. Patient asymptomatic vital signs stable. David Parsons says he continues to feel better. . Will send exercise flow sheets for review via media manager to Dr Clifton James.

## 2012-02-20 ENCOUNTER — Encounter: Payer: Self-pay | Admitting: Internal Medicine

## 2012-02-20 ENCOUNTER — Ambulatory Visit (INDEPENDENT_AMBULATORY_CARE_PROVIDER_SITE_OTHER): Payer: Medicare Other | Admitting: Internal Medicine

## 2012-02-20 VITALS — BP 136/90 | HR 69 | Temp 97.9°F | Resp 16 | Wt 309.0 lb

## 2012-02-20 DIAGNOSIS — I10 Essential (primary) hypertension: Secondary | ICD-10-CM

## 2012-02-20 DIAGNOSIS — B370 Candidal stomatitis: Secondary | ICD-10-CM | POA: Insufficient documentation

## 2012-02-20 MED ORDER — NYSTATIN 100000 UNIT/ML MT SUSP: 500000.0000 [IU] | Freq: Four times a day (QID) | OROMUCOSAL | Status: AC

## 2012-02-20 NOTE — Progress Notes (Signed)
Subjective:    Patient ID: David Parsons, male    DOB: 10-11-1940, 71 y.o.   MRN: [id number]  HPI Pt presents to clinic for followup of multiple medical problems. Since last visit pt now s/p aortic root surgery and 1V CABG. Has done well postoperatively. Is participating in cardiac rehab. Initial blood pressures mildly elevated however now with further exercise notes improvement to 120's. Did have post op white appearance to tongue. Improved with nystatin however still notes some discoloration. No dysphagia or odynophagia.   Past Medical History  Diagnosis Date  . Hypertension   . Aortic insufficiency      02/16/09 Chest CT:  The aortic root has a diameter measuring 4.6 cm, image 64 of the coronal series.  The ascending    thoracic aorta has a diameter of 3.8 cm, image 65 of the coronal series.  At the level of the aortic arch    the thoracic aorta has a maximum diameter of 3.2 cm.  The descending thoracic aorta has a maximum           diameter of 3.2 cm.  There is no evidence for aortic disse        . GERD (gastroesophageal reflux disease)   . Hypokalemia   . Helicobacter pylori gastritis     (Tx Pylera 4/10)  . Cardiomyopathy      01/2009 - Cath - nonobs. dzs.  EF 35%.  . Gallstones     s/p cholecystectomy    . Sleep apnea     occasionally wears CPAP at night  . CAD (coronary artery disease)   . Aortic root enlargement     The aortic root has a diameter measuring 4.6 cm, image 64 of the coronal series.  The ascending  thoracic aorta has a diameter of 3.8 cm, image 65 of the coronal series.  At the level of the aortic arch   the thoracic aorta has a maximum diameter of 3.2 cm.  The descending thoracic aorta has a maximum   diameter of 3.2 cm.  There is no evidence for aortic dissection.       . Iron deficiency anemia   . CHF (congestive heart failure)   . Obesity, Class III, BMI 40-49.9 (morbid obesity)   . Heart murmur   . PONV (postoperative nausea and vomiting)   . 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 Diabetes mellitus    Past Surgical History  Procedure Date  . Cholecystectomy   . Adrenalectomy   . Colonoscopy w/ biopsies and polypectomy 11/04/2008    colon polyps, diverticulosis   . Upper gastrointestinal endoscopy 11/04/2008    w/bx, H pylori gastritis  . Upper gastrointestinal endoscopy 09/25/2011  . Colonoscopy 09/25/2011  . Multiple tooth extractions   . Tee without cardioversion 11/22/2011    Procedure: TRANSESOPHAGEAL ECHOCARDIOGRAM (TEE);  Surgeon: Laurey Morale, MD;  Location: [facility] ENDOSCOPY;  Service: Cardiovascular;  Laterality: N/A;  . Cardiac catheterization     2013  . Cardiac valve replacement 12/28/2011    Bentall with tissue valve  . Coronary artery bypass graft 12/28/2011    Procedure: CORONARY ARTERY BYPASS GRAFTING (CABG);  Surgeon: Purcell Nails, MD;  Location: [facility] OR;  Service: Open Heart Surgery;  Laterality:  N/A;  cabg x 1  . Bentall procedure 12/28/2011    Procedure: BENTALL PROCEDURE;  Surgeon: Purcell Nails, MD;  Location: [facility] OR;  Service: Open Heart Surgery;  Laterality: N/A;    reports that he has never smoked. He has never used smokeless tobacco. He reports that he does not drink alcohol or use illicit drugs. family history includes Breast cancer in his sisters; Colon cancer in his sister; Colon cancer (age of onset:70) in his brother; Diabetes in his sister; Hypertension in his mother; Kidney failure in his sister; Prostate cancer in his brother; Stomach cancer in his sister; and Stroke in his father. Allergies  Allergen Reactions  . Latex Hives    Latex gloves       Review of Systems see hpi     Objective:   Physical Exam  Nursing note and vitals reviewed. Constitutional: He  appears well-developed and well-nourished. No distress.  HENT:  Head: Normocephalic and atraumatic.  Left Ear: External ear normal.       Mild white appearance of tongue. No other abnormalities noted.  Eyes: Conjunctivae are normal. No scleral icterus.  Neck: Neck supple. No JVD present.  Cardiovascular: Normal rate and regular rhythm.   Murmur heard. Pulmonary/Chest: Effort normal and breath sounds normal. No respiratory distress. He has no wheezes. He has no rales.  Neurological: He is alert.  Skin: Skin is warm and dry. He is not diaphoretic.       Sternal wound well healed with minimal scarring.  Psychiatric: He has a normal mood and affect.          Assessment & Plan:

## 2012-02-20 NOTE — Assessment & Plan Note (Signed)
reattempt nystatin. Followup if no improvement or worsening.

## 2012-02-20 NOTE — Assessment & Plan Note (Signed)
Improving control.  Continue current regimen. 

## 2012-02-21 ENCOUNTER — Encounter (HOSPITAL_COMMUNITY)
Admission: RE | Admit: 2012-02-21 | Discharge: 2012-02-21 | Disposition: A | Discharge status: Home / Self Care | Payer: Medicare Other | Source: Ambulatory Visit | Attending: Cardiovascular Disease | Admitting: Cardiovascular Disease

## 2012-02-23 ENCOUNTER — Other Ambulatory Visit: Payer: Self-pay | Admitting: *Deleted

## 2012-02-23 ENCOUNTER — Encounter (HOSPITAL_COMMUNITY)
Admission: RE | Admit: 2012-02-23 | Discharge: 2012-02-23 | Disposition: A | Discharge status: Home / Self Care | Payer: Medicare Other | Source: Ambulatory Visit | Attending: Cardiovascular Disease | Admitting: Cardiovascular Disease

## 2012-02-23 ENCOUNTER — Other Ambulatory Visit (INDEPENDENT_AMBULATORY_CARE_PROVIDER_SITE_OTHER): Payer: Medicare Other

## 2012-02-23 DIAGNOSIS — I712 Thoracic aortic aneurysm, without rupture: Secondary | ICD-10-CM

## 2012-02-23 DIAGNOSIS — E876 Hypokalemia: Secondary | ICD-10-CM

## 2012-02-23 LAB — BASIC METABOLIC PANEL
BUN: 11 mg/dL (ref 6–23)
Calcium: 9.3 mg/dL (ref 8.4–10.5)
Creatinine, Ser: 1 mg/dL (ref 0.4–1.5)

## 2012-02-23 MED ORDER — POTASSIUM CHLORIDE CRYS ER 20 MEQ PO TBCR: 40.0000 meq | EXTENDED_RELEASE_TABLET | Freq: Three times a day (TID) | ORAL | Status: DC

## 2012-02-23 NOTE — Progress Notes (Signed)
During third station pt complained of feeling dizzy.  Bp checked 132/82.  Pt reported that he had a small bowl of oatmeal for breakfast.  Pt given gingerale and peanut butter and crackers.  Pt's symptoms were relieved no further complaint.  Pt scheduled to have his blood drawn for BMET at 1115.  Will monitor results.

## 2012-02-26 ENCOUNTER — Encounter (HOSPITAL_COMMUNITY)
Admission: RE | Admit: 2012-02-26 | Discharge: 2012-02-26 | Disposition: A | Discharge status: Home / Self Care | Payer: Medicare Other | Source: Ambulatory Visit | Attending: Cardiovascular Disease | Admitting: Cardiovascular Disease

## 2012-02-28 ENCOUNTER — Encounter (HOSPITAL_COMMUNITY)
Admission: RE | Admit: 2012-02-28 | Discharge: 2012-02-28 | Disposition: A | Discharge status: Home / Self Care | Payer: Medicare Other | Source: Ambulatory Visit | Attending: Cardiovascular Disease | Admitting: Cardiovascular Disease

## 2012-02-28 NOTE — Progress Notes (Signed)
Blood pressure 150/94 on the walking track then 150/100.  David Parsons walked longer on the walking track then he normally does. David Parsons took his medications as prescribed.  Patient is without complaints today. Resting blood pressures within normal limits.Exit blood pressure 124/70. Will fax exercise flow sheets to Dr. Gibson Ramp office for review

## 2012-03-01 ENCOUNTER — Encounter (HOSPITAL_COMMUNITY)
Admission: RE | Admit: 2012-03-01 | Discharge: 2012-03-01 | Disposition: A | Discharge status: Home / Self Care | Payer: Medicare Other | Source: Ambulatory Visit | Attending: Cardiovascular Disease | Admitting: Cardiovascular Disease

## 2012-03-01 NOTE — Progress Notes (Signed)
David Parsons's sternal incision appears reddened mostly towards the mid and distal incision.  Dr Orvan July office called and notified. Dr Orvan July office will give the patient a call at home it appears to be a possible keloid formation. Patient denies pain or discomfort at the incision.

## 2012-03-04 ENCOUNTER — Encounter (HOSPITAL_COMMUNITY)
Admission: RE | Admit: 2012-03-04 | Discharge: 2012-03-04 | Disposition: A | Discharge status: Home / Self Care | Payer: Medicare Other | Source: Ambulatory Visit | Attending: Cardiovascular Disease | Admitting: Cardiovascular Disease

## 2012-03-04 ENCOUNTER — Other Ambulatory Visit (INDEPENDENT_AMBULATORY_CARE_PROVIDER_SITE_OTHER): Payer: Medicare Other

## 2012-03-04 ENCOUNTER — Telehealth: Payer: Self-pay | Admitting: Internal Medicine

## 2012-03-04 ENCOUNTER — Telehealth: Payer: Self-pay | Admitting: Cardiovascular Disease

## 2012-03-04 DIAGNOSIS — E876 Hypokalemia: Secondary | ICD-10-CM | POA: Diagnosis not present

## 2012-03-04 DIAGNOSIS — I712 Thoracic aortic aneurysm, without rupture: Secondary | ICD-10-CM | POA: Diagnosis not present

## 2012-03-04 DIAGNOSIS — I10 Essential (primary) hypertension: Secondary | ICD-10-CM

## 2012-03-04 LAB — BASIC METABOLIC PANEL
BUN: 12 mg/dL (ref 6–23)
Calcium: 9.4 mg/dL (ref 8.4–10.5)
GFR: 91.81 mL/min (ref >60.00)
Glucose, Bld: 96 mg/dL (ref 70–99)
Potassium: 3.2 mEq/L — ABNORMAL LOW (ref 3.5–5.1)
Sodium: 142 mEq/L (ref 135–145)

## 2012-03-04 MED ORDER — CARVEDILOL 12.5 MG PO TABS: 12.5000 mg | ORAL_TABLET | Freq: Two times a day (BID) | ORAL | Status: DC

## 2012-03-04 MED ORDER — SPIRONOLACTONE 25 MG PO TABS: 12.5000 mg | ORAL_TABLET | Freq: Every day | ORAL | Status: DC

## 2012-03-04 NOTE — Telephone Encounter (Signed)
LMOM with contact name & number for patient call back to clarify if taking requested medication [Mycardis] which is not showing as active on EMR medication list/SLS

## 2012-03-04 NOTE — Telephone Encounter (Signed)
David Parsons 03/04/2012 11:31 AM Signed  Refill-micardis hct 80-25mg  tablet. Take one tablet by mouth once daily. Qty 30 date written 10.22.12  This medication appears to have been D/C upon discharge from [facility] hospital post Sx on 05.13.13/SLS Please advise.

## 2012-03-04 NOTE — Telephone Encounter (Signed)
Refill-micardis hct 80-25mg  tablet. Take one tablet by mouth once daily. Qty 30 date written 10.22.12

## 2012-03-04 NOTE — Telephone Encounter (Signed)
Please return call to patient [phone], patient seen today for labs has been c/o of dizzy spells after he left.

## 2012-03-04 NOTE — Telephone Encounter (Signed)
pls check with pt. Not on list and there have been multiple medication changes since surgery. pls see if is still taking

## 2012-03-04 NOTE — Telephone Encounter (Signed)
Labs reviewed with Dr. Graciela Husbands. Per Dr. Graciela Husbands pt to increase K-Dur to 60 meq three times daily for 2 days then go back to 40 meq three times daily, Hold lasix for 4 days and then restart at 40 mg daily, start aldactone 12.5 mg by mouth daily and decrease Coreg to 12.5 mg by mouth twice daily. He will need BMP on 7/22. I spoke with pt's wife and gave her these instructions. She verbalized understanding. I will send prescription for aldactone and new dose of Coreg to [facility] on [address]. Pt will come in after cardiac rehab on July 22,2013 for BMP

## 2012-03-04 NOTE — Telephone Encounter (Signed)
Spoke with pt who reports he has dizzy spells at times.  Overall has been feeling well.  Went to cardiac rehab and had blood drawn at our office today. When he got home he felt dizzy getting out of car. He stood still until it passed. No more dizzy spells since. States he is staying hydrated. He says blood pressure has been good at cardiac rehab. Potassium today 3.2.  Pt is taking Lasix 40 mg daily and K-Dur 40 meq three times daily. I instructed pt to call us back is dizziness increases. Will review labs with Dr. Graciela Husbands

## 2012-03-05 ENCOUNTER — Telehealth: Payer: Self-pay | Admitting: Cardiovascular Disease

## 2012-03-05 ENCOUNTER — Ambulatory Visit: Payer: Medicare Other | Admitting: Internal Medicine

## 2012-03-05 DIAGNOSIS — I10 Essential (primary) hypertension: Secondary | ICD-10-CM

## 2012-03-05 DIAGNOSIS — I712 Thoracic aortic aneurysm, without rupture: Secondary | ICD-10-CM

## 2012-03-05 MED ORDER — CARVEDILOL 25 MG PO TABS: 25.0000 mg | ORAL_TABLET | Freq: Two times a day (BID) | ORAL | Status: DC

## 2012-03-05 NOTE — Telephone Encounter (Signed)
Reviewed with Dr. Graciela Husbands.  Per Dr. Graciela Husbands pt should stop Micardis/HCT.  He should follow instructions given yesterday regarding K-Dur and Lasix.  He should stay on Coreg 25 mg by mouth twice daily(do not decrease to 12.5 mg as directed yesterday) and he should not start Aldactone.  I spoke with wife and gave her these instructions. She verbalizes understanding. She is aware pt will still need to come to office for BMP on March 11, 2012.  I also called pt's pharmacy and told them not to fill Aldactone and remove Micardis/HCT from pt's med profile and that I would send electronic prescription for Coreg 25mg  twice daily.

## 2012-03-05 NOTE — Telephone Encounter (Signed)
Pt's wife calling to see if pt still need taking mycardis or not? pls call [phone]

## 2012-03-05 NOTE — Telephone Encounter (Signed)
Spoke with pt's wife and told her micardis was stopped when he was in the hospital and he should not be taking.  Wife states he has been taking it daily--Micardis HCT 80/25.  I instructed wife to make sure pt brings all medications to office visit with Dr. Clifton James.

## 2012-03-06 ENCOUNTER — Encounter (HOSPITAL_COMMUNITY)
Admission: RE | Admit: 2012-03-06 | Discharge: 2012-03-06 | Disposition: A | Discharge status: Home / Self Care | Payer: Medicare Other | Source: Ambulatory Visit | Attending: Cardiovascular Disease | Admitting: Cardiovascular Disease

## 2012-03-06 NOTE — Progress Notes (Signed)
Advised David Parsons not to exercise until his BMET Draw.

## 2012-03-06 NOTE — Progress Notes (Signed)
Nutrition Note Spoke with pt. Pt potassium level is low. Pt given a list of potassium content of different foods. Pt stated he was going to try and focus on the high potassium food list. Continue client-centered nutrition education by RD as part of interdisciplinary care.  Monitor and evaluate progress toward nutrition goal with team.  BMET    Component Value Date/Time   NA 142 03/04/2012 1120   K 3.2* 03/04/2012 1120   CL 103 03/04/2012 1120   CO2 32 03/04/2012 1120   GLUCOSE 96 03/04/2012 1120   BUN 12 03/04/2012 1120   CREATININE 1.0 03/04/2012 1120   CREATININE 0.85 12/08/2011 1346   CALCIUM 9.4 03/04/2012 1120   GFRNONAA 84* 01/01/2012 0920   GFRAA >90 01/01/2012 0920

## 2012-03-08 ENCOUNTER — Encounter (HOSPITAL_COMMUNITY): Payer: Medicare Other

## 2012-03-11 ENCOUNTER — Encounter (HOSPITAL_COMMUNITY): Payer: Medicare Other

## 2012-03-11 ENCOUNTER — Other Ambulatory Visit (INDEPENDENT_AMBULATORY_CARE_PROVIDER_SITE_OTHER): Payer: Medicare Other

## 2012-03-11 DIAGNOSIS — I10 Essential (primary) hypertension: Secondary | ICD-10-CM | POA: Diagnosis not present

## 2012-03-11 DIAGNOSIS — I712 Thoracic aortic aneurysm, without rupture: Secondary | ICD-10-CM

## 2012-03-11 DIAGNOSIS — Z79899 Other long term (current) drug therapy: Secondary | ICD-10-CM

## 2012-03-12 ENCOUNTER — Telehealth: Payer: Self-pay | Admitting: Cardiovascular Disease

## 2012-03-12 ENCOUNTER — Ambulatory Visit (INDEPENDENT_AMBULATORY_CARE_PROVIDER_SITE_OTHER): Payer: Medicare Other | Admitting: *Deleted

## 2012-03-12 DIAGNOSIS — I2510 Atherosclerotic heart disease of native coronary artery without angina pectoris: Secondary | ICD-10-CM

## 2012-03-12 DIAGNOSIS — I119 Hypertensive heart disease without heart failure: Secondary | ICD-10-CM | POA: Diagnosis not present

## 2012-03-12 DIAGNOSIS — I10 Essential (primary) hypertension: Secondary | ICD-10-CM | POA: Diagnosis not present

## 2012-03-12 LAB — BASIC METABOLIC PANEL
BUN: 21 mg/dL (ref 6–23)
CO2: 27 mEq/L (ref 19–32)
Calcium: 9.7 mg/dL (ref 8.4–10.5)
Chloride: 100 mEq/L (ref 96–112)
Chloride: 101 mEq/L (ref 96–112)
Creatinine, Ser: 0.8 mg/dL (ref 0.4–1.5)
GFR: 107.27 mL/min (ref >60.00)
Potassium: 4.3 mEq/L (ref 3.5–5.1)
Sodium: 139 mEq/L (ref 135–145)
Sodium: 139 mEq/L (ref 135–145)

## 2012-03-12 NOTE — Telephone Encounter (Signed)
Called pt no results in system/ will review

## 2012-03-12 NOTE — Telephone Encounter (Signed)
New Problem:    Patient called in wanting to know what the results of his latest lab work was.  Please call back.

## 2012-03-12 NOTE — Telephone Encounter (Signed)
No answer, msg left we will try again.

## 2012-03-12 NOTE — Telephone Encounter (Signed)
Follow-up:   Patient called back. He would prefer to be contacted on his home phone number.

## 2012-03-12 NOTE — Telephone Encounter (Signed)
Pt needs redraw/ Madlyn Frankel contacted pt to come in for redraw. Pt agreed to plan,

## 2012-03-13 ENCOUNTER — Encounter (HOSPITAL_COMMUNITY): Payer: Medicare Other

## 2012-03-14 DIAGNOSIS — M79609 Pain in unspecified limb: Secondary | ICD-10-CM | POA: Diagnosis not present

## 2012-03-14 DIAGNOSIS — B351 Tinea unguium: Secondary | ICD-10-CM | POA: Diagnosis not present

## 2012-03-14 NOTE — Telephone Encounter (Signed)
F/u   Please return call to patient concerning lab result questions. He can be reached at [phone]

## 2012-03-14 NOTE — Telephone Encounter (Signed)
Spoke with pt about lab results from 03/12/12.

## 2012-03-15 ENCOUNTER — Telehealth (HOSPITAL_COMMUNITY): Payer: Self-pay | Admitting: *Deleted

## 2012-03-15 ENCOUNTER — Encounter (HOSPITAL_COMMUNITY): Admission: RE | Admit: 2012-03-15 | Payer: Medicare Other | Source: Ambulatory Visit

## 2012-03-15 NOTE — Telephone Encounter (Signed)
Left detailed message on home# to call with clarification of requested medication.

## 2012-03-18 ENCOUNTER — Telehealth: Payer: Self-pay | Admitting: *Deleted

## 2012-03-18 ENCOUNTER — Encounter (HOSPITAL_COMMUNITY)
Admission: RE | Admit: 2012-03-18 | Discharge: 2012-03-18 | Disposition: A | Discharge status: Home / Self Care | Payer: Medicare Other | Source: Ambulatory Visit | Attending: Cardiovascular Disease | Admitting: Cardiovascular Disease

## 2012-03-18 NOTE — Telephone Encounter (Signed)
BP readings received from [facility] at Cardiac Rehab. In reviewing the patient's chart, his hydralazine was decreased to 50 mg once daily and norvasc was held on 01/29/12 due to low BP's. On 02/13/12 his weight went up and lasix was increased from every other day to lasix 40 mg once daily. He has had issues with low potassium as well in the interim. His most recent meds with BP effects include coreg 25 mg BID & lasix 40 mg daily (he is also on potassium 20 meq TID). Readings reviewed with Dr. Elease Hashimoto. He recommends no changes to meds today due to inconsistency of readings. He would have the patient monitor his BP at home and call with more consistent elevated readings. He feels the patient can exercise at Cardiac Rehab. I have spoken with the patient and made him aware of Dr. Harvie Bridge recommendations. He will obtain a home BP monitor and start to monitor and record his readings. I will forward this message to Dr. Clifton James and his nurse as an Lorain Childes. I will leave the paper copy of his Cardiac Rehab BP readings for Pat.

## 2012-03-18 NOTE — Progress Notes (Signed)
Entry blood pressure 148/102.  Repeat blood pressure 154/94 then 154/90. Medications reviewed with wife. David Parsons.  David Parsons took his medications as prescribed. David Parsons will not exercise today due to elevated blood pressure. Dr Gibson Ramp office called and notified spoke with [facility].   Will fax exercise flow sheets for review.  Dr Elease Hashimoto to review blood pressures.

## 2012-03-18 NOTE — Telephone Encounter (Signed)
Call received from Byrd Hesselbach, RN at [facility]. She states the patient is at cardiac rehab today with BP's coming in at 152/102. His potassium has been low and his meds have been adjusted due to low BP's. Per Byrd Hesselbach, his micardis and amlodipine have been stopped. He is currently on Coreg 25 mg BID. His repeat BP is 154/94. I will pull his records and review what has transpired with the patient recently. I advised Byrd Hesselbach I will call him back at home.

## 2012-03-20 ENCOUNTER — Encounter (HOSPITAL_COMMUNITY): Payer: Medicare Other

## 2012-03-22 ENCOUNTER — Encounter (HOSPITAL_COMMUNITY)
Admission: RE | Admit: 2012-03-22 | Discharge: 2012-03-22 | Disposition: A | Discharge status: Home / Self Care | Payer: Medicare Other | Source: Ambulatory Visit | Attending: Cardiovascular Disease | Admitting: Cardiovascular Disease

## 2012-03-22 ENCOUNTER — Telehealth: Payer: Self-pay | Admitting: *Deleted

## 2012-03-22 DIAGNOSIS — G4730 Sleep apnea, unspecified: Secondary | ICD-10-CM | POA: Insufficient documentation

## 2012-03-22 DIAGNOSIS — I2510 Atherosclerotic heart disease of native coronary artery without angina pectoris: Secondary | ICD-10-CM | POA: Diagnosis not present

## 2012-03-22 DIAGNOSIS — K219 Gastro-esophageal reflux disease without esophagitis: Secondary | ICD-10-CM | POA: Diagnosis not present

## 2012-03-22 DIAGNOSIS — Z951 Presence of aortocoronary bypass graft: Secondary | ICD-10-CM | POA: Diagnosis not present

## 2012-03-22 DIAGNOSIS — Z5189 Encounter for other specified aftercare: Secondary | ICD-10-CM | POA: Diagnosis not present

## 2012-03-22 DIAGNOSIS — I10 Essential (primary) hypertension: Secondary | ICD-10-CM | POA: Diagnosis not present

## 2012-03-22 DIAGNOSIS — I712 Thoracic aortic aneurysm, without rupture, unspecified: Secondary | ICD-10-CM | POA: Insufficient documentation

## 2012-03-22 DIAGNOSIS — I359 Nonrheumatic aortic valve disorder, unspecified: Secondary | ICD-10-CM | POA: Diagnosis not present

## 2012-03-22 DIAGNOSIS — I4892 Unspecified atrial flutter: Secondary | ICD-10-CM | POA: Diagnosis not present

## 2012-03-22 DIAGNOSIS — I509 Heart failure, unspecified: Secondary | ICD-10-CM | POA: Insufficient documentation

## 2012-03-22 DIAGNOSIS — Z954 Presence of other heart-valve replacement: Secondary | ICD-10-CM | POA: Insufficient documentation

## 2012-03-22 DIAGNOSIS — I428 Other cardiomyopathies: Secondary | ICD-10-CM | POA: Insufficient documentation

## 2012-03-22 NOTE — Progress Notes (Addendum)
David Parsons returned to exercise today per Dr Elease Hashimoto. Blood pressure 152/92 on the track exercise stopped. [name]'s weight is 144.1 kg today.  Last weight 141.5 on 03/04/2012.  David Parsons denies SOB. Upon inspection lung fields clear upon auscultation. Trace edema Right leg other wise minimal edema noted.  SA02 99% on Room Air.  Repeat blood pressure 138/84. Pat Dr Gibson Ramp nurse called and notified of weight gain. Patient knows to call Dr Gibson Ramp office if he experiences shortness of breath or increased weight gain over the weekend.

## 2012-03-22 NOTE — Telephone Encounter (Signed)
Received call from [facility] at Cardiac Rehab that pt's wt had gone from 141 kg when last at Cardiac rehab to 144 kg today. Pt has no shortness of breath, swelling or complaints.  Oxygen sat 99%.  Blood pressure 144/87 and 150/90 with exercise. Pt has had fluctuation in weight and blood pressures since surgery. Will make Dr. Clifton James aware when back on Monday. Pt has appt with Dr. Clifton James on March 28, 2012.

## 2012-03-24 NOTE — Telephone Encounter (Signed)
Pat, Can we check on him and his weight? If it is still up, we will need to increase his Lasix. Thanks, chris

## 2012-03-25 ENCOUNTER — Encounter (HOSPITAL_COMMUNITY): Admission: RE | Admit: 2012-03-25 | Payer: Medicare Other | Source: Ambulatory Visit

## 2012-03-25 NOTE — Telephone Encounter (Signed)
Call received from [facility] at Cardiac Rehab. The patient's BP today was 170/100 when entering Cardiac Rehab. The patient sat for a while and his repeat BP was 142/80 HR- 91. The patient is feeling well. No mention of increased weight gain. He has an appointment with Dr. Clifton James on 03/28/12. Per Byrd Hesselbach, she is going to hold him from Cardiac Rehab until the patient sees Dr. Clifton James on 03/28/12. Will forward to [facility].

## 2012-03-25 NOTE — Progress Notes (Signed)
Blood pressure 170/100 then 148/106 after rest blood pressure 142/80.  Will hold exercise due to continued elevated blood pressure. David Parsons has an appointment with Dr Clifton James on 8/82013.  Will hold exercise until MD appointment.

## 2012-03-26 NOTE — Telephone Encounter (Signed)
Reviewed with Dr. Clifton James and no changes needed prior to appt on March 28, 2012

## 2012-03-26 NOTE — Telephone Encounter (Signed)
Spoke with Byrd Hesselbach at Cardiac Rehab and wt on March 25, 2012 was 144.2 kg

## 2012-03-27 ENCOUNTER — Encounter (HOSPITAL_COMMUNITY): Payer: Medicare Other

## 2012-03-28 ENCOUNTER — Ambulatory Visit (INDEPENDENT_AMBULATORY_CARE_PROVIDER_SITE_OTHER): Payer: Medicare Other | Admitting: Cardiovascular Disease

## 2012-03-28 ENCOUNTER — Encounter: Payer: Self-pay | Admitting: Cardiovascular Disease

## 2012-03-28 VITALS — BP 180/106 | HR 71 | Ht 71.5 in | Wt 311.4 lb

## 2012-03-28 DIAGNOSIS — I5032 Chronic diastolic (congestive) heart failure: Secondary | ICD-10-CM | POA: Diagnosis not present

## 2012-03-28 DIAGNOSIS — I2510 Atherosclerotic heart disease of native coronary artery without angina pectoris: Secondary | ICD-10-CM | POA: Diagnosis not present

## 2012-03-28 DIAGNOSIS — Z954 Presence of other heart-valve replacement: Secondary | ICD-10-CM

## 2012-03-28 DIAGNOSIS — I5033 Acute on chronic diastolic (congestive) heart failure: Secondary | ICD-10-CM | POA: Diagnosis not present

## 2012-03-28 DIAGNOSIS — Z952 Presence of prosthetic heart valve: Secondary | ICD-10-CM

## 2012-03-28 DIAGNOSIS — I509 Heart failure, unspecified: Secondary | ICD-10-CM

## 2012-03-28 DIAGNOSIS — I10 Essential (primary) hypertension: Secondary | ICD-10-CM

## 2012-03-28 LAB — CBC WITH DIFFERENTIAL/PLATELET
Basophils Relative: 1.2 % (ref 0.0–3.0)
Eosinophils Relative: 3.9 % (ref 0.0–5.0)
HCT: 42.7 % (ref 39.0–52.0)
Hemoglobin: 14.1 g/dL (ref 13.0–17.0)
Lymphs Abs: 1.7 10*3/uL (ref 0.7–4.0)
MCV: 88.2 fl (ref 78.0–100.0)
Monocytes Absolute: 0.7 10*3/uL (ref 0.1–1.0)
Monocytes Relative: 10.4 % (ref 3.0–12.0)
Neutro Abs: 3.7 10*3/uL (ref 1.4–7.7)
RBC: 4.84 Mil/uL (ref 4.22–5.81)
WBC: 6.4 10*3/uL (ref 4.5–10.5)

## 2012-03-28 LAB — BASIC METABOLIC PANEL
BUN: 11 mg/dL (ref 6–23)
CO2: 30 mEq/L (ref 19–32)
Calcium: 9.3 mg/dL (ref 8.4–10.5)
Creatinine, Ser: 0.9 mg/dL (ref 0.4–1.5)
GFR: 102.01 mL/min (ref >60.00)
Glucose, Bld: 92 mg/dL (ref 70–99)
Sodium: 139 mEq/L (ref 135–145)

## 2012-03-28 MED ORDER — TELMISARTAN-HCTZ 80-25 MG PO TABS: 1.0000 | ORAL_TABLET | Freq: Every day | ORAL | Status: DC

## 2012-03-28 NOTE — Patient Instructions (Addendum)
Your physician recommends that you schedule a follow-up appointment in 6 weeks--Sept. 26, 2013 at 8:45  Your physician has requested that you have an echocardiogram. Echocardiography is a painless test that uses sound waves to create images of your heart. It provides your doctor with information about the size and shape of your heart and how well your heart's chambers and valves are working. This procedure takes approximately one hour. There are no restrictions for this procedure.  Your physician has recommended you make the following change in your medication: Resume Micardis/HCT 80/25 mg by mouth daily.  Your physician has recommended you make the following change in your medication: Increase furosemide to 40 mg twice daily for 4 days. Weigh yourself every morning and keep record.  Call us on Monday April 01, 2012 to let us know your weight and how your are feeling.

## 2012-03-28 NOTE — Assessment & Plan Note (Signed)
S/p single vessel bypass to RCA. Stable.

## 2012-03-28 NOTE — Assessment & Plan Note (Signed)
His weight is up over last 3 weeks by 7 pounds. Will increase Lasix to 40 mg po BID for 4 days and follow weight closely. Check BMET today.

## 2012-03-28 NOTE — Assessment & Plan Note (Signed)
Per Dr. Cornelius Moras. Stable. Will repeat echo to assess aortic root, AVR and LV function

## 2012-03-28 NOTE — Progress Notes (Signed)
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 He was referred in May 2010 for SOB, abdominal swelling and LE edema. He was diagnosed with a non-ischemic CM, moderate AI, non-obstructive CAD and a dilated aortic root. He underwent  a right and left heart cathJune 2010 with non-obstructive coronary artery disease. A CT angiogram was performed in June 2010 to look closely at his aortic root and showed moderate dilation of the aortic root (root 4.6 cm, ascending aorta 3.8cm). Echo in the spring of 2011 showed enlarging aortic root. I planned on performing an MRI to look at his valve, degree of AI and size of root but he was too large for the scanner. His aortic root continued to enlarge.  I saw him 11/13/11 and arranged a cardiac cath on 11/22/11 which showed enlarged root, no obstructive disease in the left system. I could not engage the RCA secondary to the abnormal takeoff with the enlarged aortic root. CTA was performed and showed dilated aortic root with patent RCA. He underwent aortic root replacement, aortic valve replacement and SVG to RCA on 12/28/11. He did well following the operation and was last seen in our office May 2013. He has been in cardiac rehab and has had recent issues with elevated BP. He has been off of all of his anti-hypertensives except for Coreg following his surgery in March. He had been hypotensive after surgery.    No chest pain or SOB. His weight is up 7 pounds over last 24 days per cardiac rehab records. He has been taking Lasix 40 mg po Qdaily. He reports feeling well but has occasional dizziness. He has not been checking weights at home. No near syncope or syncope.   Primary Care Physician: David Parsons  Last Lipid Profile:  Lipid  Panel     Component Value Date/Time   CHOL 123 05/15/2011 0835   TRIG 64 05/15/2011 0835   HDL 33* 05/15/2011 0835   CHOLHDL 3.7 05/15/2011 0835   VLDL 13 05/15/2011 0835   LDLCALC 77 05/15/2011 0835     Past Medical History  Diagnosis Date  . Hypertension   . Aortic insufficiency      02/16/09 Chest CT:  The aortic root has a diameter measuring 4.6 cm, image 64 of the coronal series.  The ascending    thoracic aorta has a diameter of 3.8 cm, image 65 of the coronal series.  At the level of the aortic arch    the thoracic aorta has a maximum diameter of 3.2 cm.  The descending thoracic aorta has a maximum           diameter of 3.2 cm.  There is no evidence for aortic disse        . GERD (gastroesophageal reflux disease)   . Hypokalemia   . Helicobacter pylori gastritis     (Tx Pylera 4/10)  . Cardiomyopathy      01/2009 - Cath - nonobs. dzs.  EF 35%.  . Gallstones     s/p cholecystectomy    . Sleep apnea     occasionally wears CPAP at night  . CAD (coronary artery disease)   . Aortic root enlargement     The aortic root has a diameter measuring 4.6  cm, image 64 of the coronal series.  The ascending  thoracic aorta has a diameter of 3.8 cm, image 65 of the coronal series.  At the level of the aortic arch   the thoracic aorta has a maximum diameter of 3.2 cm.  The descending thoracic aorta has a maximum   diameter of 3.2 cm.  There is no evidence for aortic dissection.       . Iron deficiency anemia   . CHF (congestive heart failure)   . Obesity, Class III, BMI 40-49.9 (morbid obesity)   . Heart murmur   . PONV (postoperative nausea and vomiting)   . 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 Diabetes mellitus     Past Surgical History    Procedure Date  . Cholecystectomy   . Adrenalectomy   . Colonoscopy w/ biopsies and polypectomy 11/04/2008    colon polyps, diverticulosis   . Upper gastrointestinal endoscopy 11/04/2008    w/bx, H pylori gastritis  . Upper gastrointestinal endoscopy 09/25/2011  . Colonoscopy 09/25/2011  . Multiple tooth extractions   . Tee without cardioversion 11/22/2011    Procedure: TRANSESOPHAGEAL ECHOCARDIOGRAM (TEE);  Surgeon: Laurey Morale, MD;  Location: [facility] ENDOSCOPY;  Service: Cardiovascular;  Laterality: N/A;  . Cardiac catheterization     2013  . Cardiac valve replacement 12/28/2011    Bentall with tissue valve  . Coronary artery bypass graft 12/28/2011    Procedure: CORONARY ARTERY BYPASS GRAFTING (CABG);  Surgeon: Purcell Nails, MD;  Location: [facility] OR;  Service: Open Heart Surgery;  Laterality: N/A;  cabg x 1  . Bentall procedure 12/28/2011    Procedure: BENTALL PROCEDURE;  Surgeon: Purcell Nails, MD;  Location: [facility] OR;  Service: Open Heart Surgery;  Laterality: N/A;    Current Outpatient Prescriptions  Medication Sig Dispense Refill  . aspirin EC 81 MG tablet Take 81 mg by mouth daily.      . carvedilol (COREG) 25 MG tablet Take 1 tablet (25 mg total) by mouth 2 (two) times daily with a meal.  60 tablet  3  . ferrous sulfate 325 (65 FE) MG tablet Take 325 mg by mouth 2 (two) times daily before a meal.      . furosemide (LASIX) 40 MG tablet Take 1 tablet (40 mg total) by mouth daily.  30 tablet  3  . Multiple Vitamin (MULITIVITAMIN WITH MINERALS) TABS Take 1 tablet by mouth daily.      Marland Kitchen omeprazole (PRILOSEC) 20 MG capsule Take 20 mg by mouth 2 (two) times daily.      Marland Kitchen oxycodone (OXY-IR) 5 MG capsule Take 5 mg by mouth every 4 (four) hours as needed.      . potassium chloride SA (K-DUR,KLOR-CON) 20 MEQ tablet Take 2 tablets (40 mEq total) by mouth 3 (three) times daily.  180 tablet  6  . traMADol (ULTRAM) 50 MG tablet Take 50 mg by mouth every 6 (six) hours as needed.        Allergies   Allergen Reactions  . Latex Hives    Latex gloves     History   Social History  . Marital Status: Married    Spouse Name: David Parsons    Number of Children: 0  . Years of  Education: N/A   Occupational History  .      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 Smoking status: Never Smoker   . Smokeless tobacco: Never Used  . Alcohol Use: No  . Drug Use: No  . Sexually Active: Not on file   Other Topics Concern  . Not on file   Social History Narrative   The patient is married, lives with his wife in  [place].  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  He  lives a very sedentary lifestyle.  He is a nonsmoker.  He denies alcohol consumption.     Family History  Problem Relation Age of Onset  . Breast cancer Sister   . Colon cancer Brother 70    at least 70  . Prostate cancer Brother   . Diabetes Sister     5 siblings  . Stomach cancer Sister   . Hypertension Mother   . Kidney failure Sister   . Colon cancer Sister   . Breast cancer Sister   . Stroke Father     Review of Systems:  As stated in the HPI and otherwise negative.   BP 180/106  Pulse 71  Ht 5' 11.5" (1.816 m)  Wt 311 lb 6.4 oz (141.25 kg)  BMI 42.83 kg/m2  Physical Examination: General: Well developed, well nourished, NAD HEENT: OP clear, mucus membranes moist SKIN: warm, dry. No rashes. Neuro: No focal deficits Musculoskeletal: Muscle strength 5/5 all ext Psychiatric: Mood and affect normal Neck: No JVD, no carotid bruits, no thyromegaly, no lymphadenopathy. Lungs:Clear bilaterally, no wheezes, rhonci, crackles Cardiovascular: Regular rate and rhythm. Soft systolic murmur. No gallops or rubs. Abdomen:Soft. Bowel sounds present. Non-tender.  Extremities: No lower extremity edema. Pulses are 2 + in the bilateral DP/PT.  EKG: sinus rhythm, rate 71 bpm. 1st degree AV block. PVC. RBBB.

## 2012-03-28 NOTE — Assessment & Plan Note (Signed)
Uncontrolled. Will continue Coreg and add back Micardis/HCT. Follow BP at home in cardiac rehab.

## 2012-03-28 NOTE — Telephone Encounter (Signed)
Left [3rd] detailed message with contact name & number for pt call back to clarify if Micardis-HCTZ 80-25 mg is an active medication for patient, as only record is of being px as [temporary/without refill] at hospital D/C 05.13.13 & is not listed on patient's EMR/SLS

## 2012-03-29 ENCOUNTER — Encounter (HOSPITAL_COMMUNITY): Admission: RE | Admit: 2012-03-29 | Payer: Medicare Other | Source: Ambulatory Visit

## 2012-04-01 ENCOUNTER — Encounter (HOSPITAL_COMMUNITY): Payer: Medicare Other

## 2012-04-01 ENCOUNTER — Telehealth: Payer: Self-pay | Admitting: Cardiovascular Disease

## 2012-04-01 DIAGNOSIS — I712 Thoracic aortic aneurysm, without rupture: Secondary | ICD-10-CM

## 2012-04-01 DIAGNOSIS — I10 Essential (primary) hypertension: Secondary | ICD-10-CM

## 2012-04-01 DIAGNOSIS — I509 Heart failure, unspecified: Secondary | ICD-10-CM

## 2012-04-01 MED ORDER — AMLODIPINE BESYLATE 10 MG PO TABS: 10.0000 mg | ORAL_TABLET | Freq: Every day | ORAL | Status: DC

## 2012-04-01 MED ORDER — FUROSEMIDE 40 MG PO TABS: 40.0000 mg | ORAL_TABLET | Freq: Two times a day (BID) | ORAL | Status: DC

## 2012-04-01 NOTE — Telephone Encounter (Signed)
Spoke with wife who reports the following blood pressure and weights for pt  Blood pressure:  8/9--164/101 at 8:15 AM, 142/95 at 8:40 PM 8/10--155/90 at 9:30 AM, 143/89 at 4:10 PM, 153/91 at 10:06 PM 8/11--148/97 at 9:04 AM 8/12--156/94 this AM  He started Micardis /HCT on August 8th after office visit.  Weights:  8/9-311 lbs. 8/10--306 lbs 8/11--304 lbs. 8/12--308 lbs.  No swelling and pt feels good.

## 2012-04-01 NOTE — Telephone Encounter (Signed)
Reviewed with Dr. Clifton James and pt should also continue Lasix 40 twice daily, same dose potassium and have BMP done in 10 days. Wife notified of all instructions. . Will send prescriptions for Norvasc and increased Lasix dose to [facility] on [address]. Pt will come in for BMP on 8/21. He will continue to monitor blood pressure at home and call if it remains elevated.

## 2012-04-01 NOTE — Telephone Encounter (Signed)
BP still not at goal. Can we add back Norvasc 10 mg po Qdaily? He had been on this pre-op, Thanks, chris

## 2012-04-01 NOTE — Telephone Encounter (Signed)
New msg Pt was calling about BP readings. Please call back

## 2012-04-02 ENCOUNTER — Other Ambulatory Visit: Payer: Self-pay

## 2012-04-02 ENCOUNTER — Ambulatory Visit (HOSPITAL_COMMUNITY): Payer: Medicare Other | Attending: Cardiovascular Disease | Admitting: Radiology

## 2012-04-02 DIAGNOSIS — I517 Cardiomegaly: Secondary | ICD-10-CM | POA: Diagnosis not present

## 2012-04-02 DIAGNOSIS — I2589 Other forms of chronic ischemic heart disease: Secondary | ICD-10-CM | POA: Diagnosis not present

## 2012-04-02 DIAGNOSIS — I319 Disease of pericardium, unspecified: Secondary | ICD-10-CM | POA: Diagnosis not present

## 2012-04-02 DIAGNOSIS — I10 Essential (primary) hypertension: Secondary | ICD-10-CM | POA: Insufficient documentation

## 2012-04-02 DIAGNOSIS — I2510 Atherosclerotic heart disease of native coronary artery without angina pectoris: Secondary | ICD-10-CM

## 2012-04-02 DIAGNOSIS — Z952 Presence of prosthetic heart valve: Secondary | ICD-10-CM

## 2012-04-02 DIAGNOSIS — I509 Heart failure, unspecified: Secondary | ICD-10-CM | POA: Diagnosis not present

## 2012-04-02 NOTE — Progress Notes (Signed)
Echocardiogram performed.  

## 2012-04-03 ENCOUNTER — Encounter (HOSPITAL_COMMUNITY): Payer: Medicare Other

## 2012-04-05 ENCOUNTER — Encounter (HOSPITAL_COMMUNITY): Payer: Medicare Other

## 2012-04-08 ENCOUNTER — Encounter (HOSPITAL_COMMUNITY): Payer: Medicare Other

## 2012-04-08 ENCOUNTER — Encounter: Payer: Self-pay | Admitting: Family

## 2012-04-08 ENCOUNTER — Ambulatory Visit (INDEPENDENT_AMBULATORY_CARE_PROVIDER_SITE_OTHER): Payer: Medicare Other | Admitting: Family

## 2012-04-08 VITALS — BP 122/78 | HR 78 | Temp 98.3°F | Resp 16 | Wt 309.5 lb

## 2012-04-08 DIAGNOSIS — T148XXA Other injury of unspecified body region, initial encounter: Secondary | ICD-10-CM | POA: Diagnosis not present

## 2012-04-08 DIAGNOSIS — S80869A Insect bite (nonvenomous), unspecified lower leg, initial encounter: Secondary | ICD-10-CM | POA: Diagnosis not present

## 2012-04-08 DIAGNOSIS — S80861A Insect bite (nonvenomous), right lower leg, initial encounter: Secondary | ICD-10-CM

## 2012-04-08 DIAGNOSIS — L089 Local infection of the skin and subcutaneous tissue, unspecified: Secondary | ICD-10-CM

## 2012-04-08 DIAGNOSIS — W57XXXA Bitten or stung by nonvenomous insect and other nonvenomous arthropods, initial encounter: Secondary | ICD-10-CM | POA: Diagnosis not present

## 2012-04-08 MED ORDER — CEPHALEXIN 500 MG PO CAPS: 500.0000 mg | ORAL_CAPSULE | Freq: Four times a day (QID) | ORAL | Status: DC

## 2012-04-08 NOTE — Patient Instructions (Addendum)
Please call if symptoms worsen or if no improvement in 2-3 days.   Cellulitis Cellulitis is an infection of the skin and the tissue beneath it. The area is typically red and tender. It is caused by germs (bacteria) (usually staph or strep) that enter the body through cuts or sores. Cellulitis most commonly occurs in the arms or lower legs.  HOME CARE INSTRUCTIONS   If you are given a prescription for medications which kill germs (antibiotics), take as directed until finished.   If the infection is on the arm or leg, keep the limb elevated as able.   Use a warm cloth several times per day to relieve pain and encourage healing.   See your caregiver for recheck of the infected site as directed if problems arise.   Only take over-the-counter or prescription medicines for pain, discomfort, or fever as directed by your caregiver.  SEEK MEDICAL CARE IF:   The area of redness (inflammation) is spreading, there are red streaks coming from the infected site, or if a part of the infection begins to turn dark in color.   The joint or bone underneath the infected skin becomes painful after the skin has healed.   The infection returns in the same or another area after it seems to have gone away.   A boil or bump swells up. This may be an abscess.   New, unexplained problems such as pain or fever develop.  SEEK IMMEDIATE MEDICAL CARE IF:   You have a fever.   You or your child feels drowsy or lethargic.   There is vomiting, diarrhea, or lasting discomfort or feeling ill (malaise) with muscle aches and pains.  MAKE SURE YOU:   Understand these instructions.   Will watch your condition.   Will get help right away if you are not doing well or get worse.  Document Released: 05/17/2005 Document Revised: 07/27/2011 Document Reviewed: 03/25/2008 [facility] Patient Information 2012 [place], [place].

## 2012-04-08 NOTE — Assessment & Plan Note (Signed)
Will rx with keflex.  Pt instructed to call if increased pain, swelling or if no improvement in 2-3 days. Pt verbalizes understanding.

## 2012-04-08 NOTE — Progress Notes (Signed)
Subjective:    Patient ID: David Parsons, male    DOB: 10-09-1940, 71 y.o.   MRN: [id number]  HPI  [name] is a pleasant 71 yr old male who presents today with complaint of insect bite on the right calf.  Reports that he first noticed it about a week ago and that it was pruritic.  Wife has been applying ointment but notes that it is not improving.  Now blistered and seems more swollen.   Review of Systems See HPI  Past Medical History  Diagnosis Date  . Hypertension   . Aortic insufficiency      02/16/09 Chest CT:  The aortic root has a diameter measuring 4.6 cm, image 64 of the coronal series.  The ascending    thoracic aorta has a diameter of 3.8 cm, image 65 of the coronal series.  At the level of the aortic arch    the thoracic aorta has a maximum diameter of 3.2 cm.  The descending thoracic aorta has a maximum           diameter of 3.2 cm.  There is no evidence for aortic disse        . GERD (gastroesophageal reflux disease)   . Hypokalemia   . Helicobacter pylori gastritis     (Tx Pylera 4/10)  . Cardiomyopathy      01/2009 - Cath - nonobs. dzs.  EF 35%.  . Gallstones     s/p cholecystectomy    . Sleep apnea     occasionally wears CPAP at night  . CAD (coronary artery disease)   . Aortic root enlargement     The aortic root has a diameter measuring 4.6 cm, image 64 of the coronal series.  The ascending  thoracic aorta has a diameter of 3.8 cm, image 65 of the coronal series.  At the level of the aortic arch   the thoracic aorta has a maximum diameter of 3.2 cm.  The descending thoracic aorta has a maximum   diameter of 3.2 cm.  There is no evidence for aortic dissection.       . Iron deficiency anemia   . CHF (congestive heart failure)   . Obesity, Class III, BMI 40-49.9 (morbid obesity)   . Heart murmur   . PONV (postoperative nausea and vomiting)   . 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    Biological Bentall Aortic Root Replacement using 25 mm Edwards  Magna Ease pericardial tissue valve and 28 mm Vascutek Gelweave Valsalva conduit with reimplantation of left main and right coronary artery and CABG x1 using SVG to RCA  . Diabetes mellitus     History   Social History  . Marital Status: Married    Spouse Name: Steward Drone    Number of Children: 0  . Years of Education: N/A   Occupational History  .      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 Smoking status: Never Smoker   . Smokeless tobacco: Never Used  . Alcohol Use: No  . Drug Use: No  . Sexually Active: Not on file   Other Topics Concern  . Not on file   Social History Narrative   The patient is married, lives with his wife in  [place].  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  He  lives a very sedentary lifestyle.  He is a nonsmoker.  He denies alcohol consumption.  Past Surgical History  Procedure Date  . Cholecystectomy   . Adrenalectomy   . Colonoscopy w/ biopsies and polypectomy 11/04/2008    colon polyps, diverticulosis   . Upper gastrointestinal endoscopy 11/04/2008    w/bx, H pylori gastritis  . Upper gastrointestinal endoscopy 09/25/2011  . Colonoscopy 09/25/2011  . Multiple tooth extractions   . Tee without cardioversion 11/22/2011    Procedure: TRANSESOPHAGEAL ECHOCARDIOGRAM (TEE);  Surgeon: Laurey Morale, MD;  Location: [facility] ENDOSCOPY;  Service: Cardiovascular;  Laterality: N/A;  . Cardiac catheterization     2013  . Cardiac valve replacement 12/28/2011    Bentall with tissue valve  . Coronary artery bypass graft 12/28/2011    Procedure: CORONARY ARTERY BYPASS GRAFTING (CABG);  Surgeon: Purcell Nails, MD;  Location: [facility] OR;  Service: Open Heart Surgery;  Laterality: N/A;  cabg x 1  . Bentall procedure 12/28/2011    Procedure: BENTALL PROCEDURE;  Surgeon: Purcell Nails, MD;  Location: [facility] OR;  Service: Open Heart Surgery;  Laterality: N/A;    Family History  Problem Relation Age of Onset  . Breast cancer Sister     . Colon cancer Brother 70    at least 70  . Prostate cancer Brother   . Diabetes Sister     5 siblings  . Stomach cancer Sister   . Hypertension Mother   . Kidney failure Sister   . Colon cancer Sister   . Breast cancer Sister   . Stroke Father     Allergies  Allergen Reactions  . Latex Hives    Latex gloves     Current Outpatient Prescriptions on File Prior to Visit  Medication Sig Dispense Refill  . amLODipine (NORVASC) 10 MG tablet Take 1 tablet (10 mg total) by mouth daily.  30 tablet  11  . aspirin EC 81 MG tablet Take 81 mg by mouth daily.      . carvedilol (COREG) 25 MG tablet Take 1 tablet (25 mg total) by mouth 2 (two) times daily with a meal.  60 tablet  3  . ferrous sulfate 325 (65 FE) MG tablet Take 325 mg by mouth 2 (two) times daily before a meal.      . furosemide (LASIX) 40 MG tablet Take 1 tablet (40 mg total) by mouth 2 (two) times daily.  60 tablet  6  . Multiple Vitamin (MULITIVITAMIN WITH MINERALS) TABS Take 1 tablet by mouth daily.      Marland Kitchen omeprazole (PRILOSEC) 20 MG capsule Take 20 mg by mouth daily.       . potassium chloride SA (K-DUR,KLOR-CON) 20 MEQ tablet Take 2 tablets (40 mEq total) by mouth 3 (three) times daily.  180 tablet  6  . telmisartan-hydrochlorothiazide (MICARDIS HCT) 80-25 MG per tablet Take 1 tablet by mouth daily.  30 tablet  6    BP 122/78  Pulse 78  Temp 98.3 F (36.8 C) (Oral)  Resp 16  Wt 309 lb 8 oz (140.388 kg)  SpO2 97%       Objective:   Physical Exam  Constitutional: He appears well-developed and well-nourished. No distress.  Pulmonary/Chest: Effort normal and breath sounds normal.  Skin:       Quarter sized area of induration noted right posterior calf.  3 small blisters in center- intact without drainage.  Minimal erythema is noted.           Assessment & Plan:

## 2012-04-09 NOTE — Telephone Encounter (Signed)
Pt in for OV 08.19.13.

## 2012-04-10 ENCOUNTER — Encounter (HOSPITAL_COMMUNITY): Payer: Medicare Other

## 2012-04-10 ENCOUNTER — Other Ambulatory Visit (INDEPENDENT_AMBULATORY_CARE_PROVIDER_SITE_OTHER): Payer: Medicare Other

## 2012-04-10 DIAGNOSIS — I10 Essential (primary) hypertension: Secondary | ICD-10-CM

## 2012-04-10 DIAGNOSIS — I509 Heart failure, unspecified: Secondary | ICD-10-CM | POA: Diagnosis not present

## 2012-04-10 LAB — BASIC METABOLIC PANEL
CO2: 29 mEq/L (ref 19–32)
Calcium: 9.1 mg/dL (ref 8.4–10.5)
Chloride: 101 mEq/L (ref 96–112)
Creatinine, Ser: 0.8 mg/dL (ref 0.4–1.5)
Glucose, Bld: 89 mg/dL (ref 70–99)

## 2012-04-12 ENCOUNTER — Encounter (HOSPITAL_COMMUNITY): Payer: Medicare Other

## 2012-04-15 ENCOUNTER — Encounter (HOSPITAL_COMMUNITY)
Admission: RE | Admit: 2012-04-15 | Discharge: 2012-04-15 | Disposition: A | Discharge status: Home / Self Care | Payer: Medicare Other | Source: Ambulatory Visit | Attending: Cardiovascular Disease | Admitting: Cardiovascular Disease

## 2012-04-15 DIAGNOSIS — Z951 Presence of aortocoronary bypass graft: Secondary | ICD-10-CM | POA: Diagnosis not present

## 2012-04-15 DIAGNOSIS — Z954 Presence of other heart-valve replacement: Secondary | ICD-10-CM | POA: Diagnosis not present

## 2012-04-15 DIAGNOSIS — I2510 Atherosclerotic heart disease of native coronary artery without angina pectoris: Secondary | ICD-10-CM | POA: Diagnosis not present

## 2012-04-15 DIAGNOSIS — I359 Nonrheumatic aortic valve disorder, unspecified: Secondary | ICD-10-CM | POA: Diagnosis not present

## 2012-04-15 DIAGNOSIS — I712 Thoracic aortic aneurysm, without rupture: Secondary | ICD-10-CM | POA: Diagnosis not present

## 2012-04-15 DIAGNOSIS — Z5189 Encounter for other specified aftercare: Secondary | ICD-10-CM | POA: Diagnosis not present

## 2012-04-15 NOTE — Progress Notes (Signed)
Pt return to rehab today after extensive absences for medical management of low potassium levels and BP.

## 2012-04-17 ENCOUNTER — Encounter (HOSPITAL_COMMUNITY)
Admission: RE | Admit: 2012-04-17 | Discharge: 2012-04-17 | Disposition: A | Discharge status: Home / Self Care | Payer: Medicare Other | Source: Ambulatory Visit | Attending: Cardiovascular Disease | Admitting: Cardiovascular Disease

## 2012-04-17 ENCOUNTER — Ambulatory Visit (INDEPENDENT_AMBULATORY_CARE_PROVIDER_SITE_OTHER): Payer: Medicare Other | Admitting: Family

## 2012-04-17 ENCOUNTER — Encounter: Payer: Self-pay | Admitting: Family

## 2012-04-17 VITALS — BP 138/86 | HR 77 | Temp 98.1°F | Resp 16 | Wt 310.0 lb

## 2012-04-17 DIAGNOSIS — I712 Thoracic aortic aneurysm, without rupture: Secondary | ICD-10-CM | POA: Diagnosis not present

## 2012-04-17 DIAGNOSIS — Z5189 Encounter for other specified aftercare: Secondary | ICD-10-CM | POA: Diagnosis not present

## 2012-04-17 DIAGNOSIS — L089 Local infection of the skin and subcutaneous tissue, unspecified: Secondary | ICD-10-CM | POA: Diagnosis not present

## 2012-04-17 DIAGNOSIS — I359 Nonrheumatic aortic valve disorder, unspecified: Secondary | ICD-10-CM | POA: Diagnosis not present

## 2012-04-17 DIAGNOSIS — Z951 Presence of aortocoronary bypass graft: Secondary | ICD-10-CM | POA: Diagnosis not present

## 2012-04-17 DIAGNOSIS — Z954 Presence of other heart-valve replacement: Secondary | ICD-10-CM | POA: Diagnosis not present

## 2012-04-17 DIAGNOSIS — I2510 Atherosclerotic heart disease of native coronary artery without angina pectoris: Secondary | ICD-10-CM | POA: Diagnosis not present

## 2012-04-17 MED ORDER — DOXYCYCLINE HYCLATE 100 MG PO TABS: 100.0000 mg | ORAL_TABLET | Freq: Two times a day (BID) | ORAL | Status: AC

## 2012-04-17 NOTE — Assessment & Plan Note (Signed)
Appears superficial at this point.  As he just finished keflex and this has occurred beneath previous lesions, suspect MRSA may be culprit.  Will rx with doxy, pt and wife to keep close eye on the area and contact us if symptoms worsen of if they do not continue to improve with doxy.

## 2012-04-17 NOTE — Patient Instructions (Addendum)
Keep a close eye on the "bumps" on the back of your leg.  Call if bumps become larger, redder, more painful or if they do not continue to improve over the next 1 week.

## 2012-04-17 NOTE — Progress Notes (Signed)
Pt with elevation in BP pre exercise. 154/96.Pt observed rushing in late for class. Pt placed in seated position.  Recheck BP after 5 minutes of rest BP 142/74.  Pt resumed exercise and BP monitor closely.  BP remained WNL for exercise and post exercise 114/80.  Continue to monitor.

## 2012-04-17 NOTE — Progress Notes (Signed)
Subjective:    Patient ID: David Parsons, male    DOB: [birth year], 71 y.o.   MRN: [id number]  HPI  David Parsons is a 71 yr old male who presents today for follow up of the "insect bite" on his right calf.  He was originally seen on 8/19 and prescribed keflex.  He has completed keflex. Initial areas have scabbed, but now he reports that his noticed the new bumps last night.      Review of Systems    see HPI  Past Medical History  Diagnosis Date  . Hypertension   . Aortic insufficiency      02/16/09 Chest CT:  The aortic root has a diameter measuring 4.6 cm, image 64 of the coronal series.  The ascending    thoracic aorta has a diameter of 3.8 cm, image 65 of the coronal series.  At the level of the aortic arch    the thoracic aorta has a maximum diameter of 3.2 cm.  The descending thoracic aorta has a maximum           diameter of 3.2 cm.  There is no evidence for aortic disse        . GERD (gastroesophageal reflux disease)   . Hypokalemia   . Helicobacter pylori gastritis     (Tx Pylera 4/10)  . Cardiomyopathy      01/2009 - Cath - nonobs. dzs.  EF 35%.  . Gallstones     s/p cholecystectomy    . Sleep apnea     occasionally wears CPAP at night  . CAD (coronary artery disease)   . Aortic root enlargement     The aortic root has a diameter measuring 4.6 cm, image 64 of the coronal series.  The ascending  thoracic aorta has a diameter of 3.8 cm, image 65 of the coronal series.  At the level of the aortic arch   the thoracic aorta has a maximum diameter of 3.2 cm.  The descending thoracic aorta has a maximum   diameter of 3.2 cm.  There is no evidence for aortic dissection.       . Iron deficiency anemia   . CHF (congestive heart failure)   . Obesity, Class III, BMI 40-49.9 (morbid obesity)   . Heart murmur   . PONV (postoperative nausea and vomiting)   . 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 Diabetes mellitus     History   Social History  . Marital Status: Married    Spouse Name: Steward Drone    Number of Children: 0  . Years of Education: N/A   Occupational History  .      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 Smoking status: Never Smoker   . Smokeless tobacco: Never Used  . Alcohol Use: No  . Drug Use: No  . Sexually Active: Not on file   Other Topics Concern  . Not on file   Social History Narrative   The patient is married, lives with his wife in  [place].  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  He  lives a very sedentary lifestyle.  He is a nonsmoker.  He denies alcohol consumption.  Past Surgical History  Procedure Date  . Cholecystectomy   . Adrenalectomy   . Colonoscopy w/ biopsies and polypectomy 11/04/2008    colon polyps, diverticulosis   . Upper gastrointestinal endoscopy 11/04/2008    w/bx, H pylori gastritis  . Upper gastrointestinal endoscopy 09/25/2011  . Colonoscopy 09/25/2011  . Multiple tooth extractions   . Tee without cardioversion 11/22/2011    Procedure: TRANSESOPHAGEAL ECHOCARDIOGRAM (TEE);  Surgeon: Laurey Morale, MD;  Location: [facility] ENDOSCOPY;  Service: Cardiovascular;  Laterality: N/A;  . Cardiac catheterization     2013  . Cardiac valve replacement 12/28/2011    Bentall with tissue valve  . Coronary artery bypass graft 12/28/2011    Procedure: CORONARY ARTERY BYPASS GRAFTING (CABG);  Surgeon: Purcell Nails, MD;  Location: [facility] OR;  Service: Open Heart Surgery;  Laterality: N/A;  cabg x 1  . Bentall procedure 12/28/2011    Procedure: BENTALL PROCEDURE;  Surgeon: Purcell Nails, MD;  Location: [facility] OR;  Service: Open Heart Surgery;  Laterality: N/A;    Family History  Problem Relation Age of Onset  . Breast cancer Sister   . Colon  cancer Brother 70    at least 70  . Prostate cancer Brother   . Diabetes Sister     5 siblings  . Stomach cancer Sister   . Hypertension Mother   . Kidney failure Sister   . Colon cancer Sister   . Breast cancer Sister   . Stroke Father     Allergies  Allergen Reactions  . Latex Hives    Latex gloves     Current Outpatient Prescriptions on File Prior to Visit  Medication Sig Dispense Refill  . amLODipine (NORVASC) 10 MG tablet Take 1 tablet (10 mg total) by mouth daily.  30 tablet  11  . aspirin EC 81 MG tablet Take 81 mg by mouth daily.      . carvedilol (COREG) 25 MG tablet Take 1 tablet (25 mg total) by mouth 2 (two) times daily with a meal.  60 tablet  3  . ferrous sulfate 325 (65 FE) MG tablet Take 325 mg by mouth 2 (two) times daily before a meal.      . furosemide (LASIX) 40 MG tablet Take 1 tablet (40 mg total) by mouth 2 (two) times daily.  60 tablet  6  . Multiple Vitamin (MULITIVITAMIN WITH MINERALS) TABS Take 1 tablet by mouth daily.      Marland Kitchen omeprazole (PRILOSEC) 20 MG capsule Take 20 mg by mouth daily.       . potassium chloride SA (K-DUR,KLOR-CON) 20 MEQ tablet Take 2 tablets (40 mEq total) by mouth 3 (three) times daily.  180 tablet  6  . telmisartan-hydrochlorothiazide (MICARDIS HCT) 80-25 MG per tablet Take 1 tablet by mouth daily.  30 tablet  6  . cephALEXin (KEFLEX) 500 MG capsule Take 1 capsule (500 mg total) by mouth 4 (four) times daily.  28 capsule  0    BP 138/86  Pulse 77  Temp 98.1 F (36.7 C) (Oral)  Resp 16  Wt 310 lb (140.615 kg)  SpO2 97%    Objective:   Physical Exam  Constitutional: He appears well-developed and well-nourished. No distress.  Cardiovascular: Normal rate and regular rhythm.   Murmur heard. Pulmonary/Chest: Effort normal and breath sounds normal. No respiratory distress. He has no wheezes. He has no rales. He exhibits no tenderness.  Musculoskeletal: He exhibits no edema.  Skin: Skin is warm and dry.  Several small  scabs noted right posterior calf.  Beneath the calf are 3 small papular lesions.  Non-tender  Psychiatric: He has a normal mood and affect. His behavior is normal. Judgment and thought content normal.          Assessment & Plan:

## 2012-04-19 ENCOUNTER — Encounter (HOSPITAL_COMMUNITY)
Admission: RE | Admit: 2012-04-19 | Discharge: 2012-04-19 | Disposition: A | Discharge status: Home / Self Care | Payer: Medicare Other | Source: Ambulatory Visit | Attending: Cardiovascular Disease | Admitting: Cardiovascular Disease

## 2012-04-19 DIAGNOSIS — I712 Thoracic aortic aneurysm, without rupture: Secondary | ICD-10-CM | POA: Diagnosis not present

## 2012-04-19 DIAGNOSIS — Z951 Presence of aortocoronary bypass graft: Secondary | ICD-10-CM | POA: Diagnosis not present

## 2012-04-19 DIAGNOSIS — I359 Nonrheumatic aortic valve disorder, unspecified: Secondary | ICD-10-CM | POA: Diagnosis not present

## 2012-04-19 DIAGNOSIS — Z954 Presence of other heart-valve replacement: Secondary | ICD-10-CM | POA: Diagnosis not present

## 2012-04-19 DIAGNOSIS — I2510 Atherosclerotic heart disease of native coronary artery without angina pectoris: Secondary | ICD-10-CM | POA: Diagnosis not present

## 2012-04-19 DIAGNOSIS — Z5189 Encounter for other specified aftercare: Secondary | ICD-10-CM | POA: Diagnosis not present

## 2012-04-22 ENCOUNTER — Encounter (HOSPITAL_COMMUNITY): Payer: Medicare Other

## 2012-04-22 DIAGNOSIS — Z954 Presence of other heart-valve replacement: Secondary | ICD-10-CM | POA: Insufficient documentation

## 2012-04-22 DIAGNOSIS — I4892 Unspecified atrial flutter: Secondary | ICD-10-CM | POA: Insufficient documentation

## 2012-04-22 DIAGNOSIS — Z951 Presence of aortocoronary bypass graft: Secondary | ICD-10-CM | POA: Insufficient documentation

## 2012-04-22 DIAGNOSIS — K219 Gastro-esophageal reflux disease without esophagitis: Secondary | ICD-10-CM | POA: Insufficient documentation

## 2012-04-22 DIAGNOSIS — I428 Other cardiomyopathies: Secondary | ICD-10-CM | POA: Insufficient documentation

## 2012-04-22 DIAGNOSIS — I712 Thoracic aortic aneurysm, without rupture, unspecified: Secondary | ICD-10-CM | POA: Insufficient documentation

## 2012-04-22 DIAGNOSIS — I2510 Atherosclerotic heart disease of native coronary artery without angina pectoris: Secondary | ICD-10-CM | POA: Insufficient documentation

## 2012-04-22 DIAGNOSIS — I509 Heart failure, unspecified: Secondary | ICD-10-CM | POA: Insufficient documentation

## 2012-04-22 DIAGNOSIS — I359 Nonrheumatic aortic valve disorder, unspecified: Secondary | ICD-10-CM | POA: Insufficient documentation

## 2012-04-22 DIAGNOSIS — Z5189 Encounter for other specified aftercare: Secondary | ICD-10-CM | POA: Insufficient documentation

## 2012-04-22 DIAGNOSIS — I10 Essential (primary) hypertension: Secondary | ICD-10-CM | POA: Insufficient documentation

## 2012-04-22 DIAGNOSIS — G4730 Sleep apnea, unspecified: Secondary | ICD-10-CM | POA: Insufficient documentation

## 2012-04-23 ENCOUNTER — Ambulatory Visit: Payer: Medicare Other | Admitting: Internal Medicine

## 2012-04-24 ENCOUNTER — Encounter (HOSPITAL_COMMUNITY)
Admission: RE | Admit: 2012-04-24 | Discharge: 2012-04-24 | Disposition: A | Discharge status: Home / Self Care | Payer: Medicare Other | Source: Ambulatory Visit | Attending: Cardiovascular Disease | Admitting: Cardiovascular Disease

## 2012-04-24 DIAGNOSIS — I359 Nonrheumatic aortic valve disorder, unspecified: Secondary | ICD-10-CM | POA: Diagnosis not present

## 2012-04-24 DIAGNOSIS — I4892 Unspecified atrial flutter: Secondary | ICD-10-CM | POA: Diagnosis not present

## 2012-04-24 DIAGNOSIS — G4730 Sleep apnea, unspecified: Secondary | ICD-10-CM | POA: Diagnosis not present

## 2012-04-24 DIAGNOSIS — Z5189 Encounter for other specified aftercare: Secondary | ICD-10-CM | POA: Diagnosis not present

## 2012-04-24 DIAGNOSIS — I10 Essential (primary) hypertension: Secondary | ICD-10-CM | POA: Diagnosis not present

## 2012-04-24 DIAGNOSIS — Z954 Presence of other heart-valve replacement: Secondary | ICD-10-CM | POA: Diagnosis not present

## 2012-04-24 DIAGNOSIS — I509 Heart failure, unspecified: Secondary | ICD-10-CM | POA: Diagnosis not present

## 2012-04-24 DIAGNOSIS — K219 Gastro-esophageal reflux disease without esophagitis: Secondary | ICD-10-CM | POA: Diagnosis not present

## 2012-04-24 DIAGNOSIS — I2510 Atherosclerotic heart disease of native coronary artery without angina pectoris: Secondary | ICD-10-CM | POA: Diagnosis not present

## 2012-04-24 DIAGNOSIS — I428 Other cardiomyopathies: Secondary | ICD-10-CM | POA: Diagnosis not present

## 2012-04-24 DIAGNOSIS — I712 Thoracic aortic aneurysm, without rupture: Secondary | ICD-10-CM | POA: Diagnosis not present

## 2012-04-24 DIAGNOSIS — Z951 Presence of aortocoronary bypass graft: Secondary | ICD-10-CM | POA: Diagnosis not present

## 2012-04-26 ENCOUNTER — Other Ambulatory Visit: Payer: Self-pay | Admitting: Thoracic Surgery (Cardiothoracic Vascular Surgery)

## 2012-04-26 ENCOUNTER — Ambulatory Visit (INDEPENDENT_AMBULATORY_CARE_PROVIDER_SITE_OTHER): Payer: Medicare Other | Admitting: Internal Medicine

## 2012-04-26 ENCOUNTER — Encounter (HOSPITAL_COMMUNITY)
Admission: RE | Admit: 2012-04-26 | Discharge: 2012-04-26 | Disposition: A | Discharge status: Home / Self Care | Payer: Medicare Other | Source: Ambulatory Visit | Attending: Cardiovascular Disease | Admitting: Cardiovascular Disease

## 2012-04-26 ENCOUNTER — Encounter: Payer: Self-pay | Admitting: Internal Medicine

## 2012-04-26 ENCOUNTER — Telehealth: Payer: Self-pay | Admitting: Internal Medicine

## 2012-04-26 VITALS — BP 132/82 | HR 77 | Temp 98.1°F | Resp 18 | Wt 305.0 lb

## 2012-04-26 DIAGNOSIS — E785 Hyperlipidemia, unspecified: Secondary | ICD-10-CM

## 2012-04-26 DIAGNOSIS — T148XXA Other injury of unspecified body region, initial encounter: Secondary | ICD-10-CM

## 2012-04-26 DIAGNOSIS — D509 Iron deficiency anemia, unspecified: Secondary | ICD-10-CM

## 2012-04-26 DIAGNOSIS — L089 Local infection of the skin and subcutaneous tissue, unspecified: Secondary | ICD-10-CM | POA: Diagnosis not present

## 2012-04-26 DIAGNOSIS — R739 Hyperglycemia, unspecified: Secondary | ICD-10-CM

## 2012-04-26 DIAGNOSIS — I10 Essential (primary) hypertension: Secondary | ICD-10-CM | POA: Diagnosis not present

## 2012-04-26 DIAGNOSIS — R7309 Other abnormal glucose: Secondary | ICD-10-CM

## 2012-04-26 DIAGNOSIS — Z79899 Other long term (current) drug therapy: Secondary | ICD-10-CM

## 2012-04-26 DIAGNOSIS — Z951 Presence of aortocoronary bypass graft: Secondary | ICD-10-CM

## 2012-04-26 NOTE — Assessment & Plan Note (Signed)
Resolved s/p 2 courses of abx

## 2012-04-26 NOTE — Telephone Encounter (Signed)
Please schedule fasting labs prior to next visit  Cbc-anemia, chem7-v58.69, a1c-hyperglycemia and lipid-dyslipidemia   Patient has upcoming appointment in January/2014. He will be going to [facility] lab

## 2012-04-26 NOTE — Assessment & Plan Note (Signed)
Normotensive and stable. Continue current regimen. Monitor bp as outpt and followup in clinic as scheduled.  

## 2012-04-26 NOTE — Progress Notes (Signed)
Subjective:    Patient ID: David Parsons, male    DOB: November 01, 1940, 71 y.o.   MRN: [id number]  HPI Pt presents to clinic for followup of multiple medical problems. Recently seen with insect bite of right calf now s/p course of keflex and doxycycline. Wound has apparently healed after second abx. Reviewed nl cbc and chem7. Losing weight with regular exercise.   Past Medical History  Diagnosis Date  . Hypertension   . Aortic insufficiency      02/16/09 Chest CT:  The aortic root has a diameter measuring 4.6 cm, image 64 of the coronal series.  The ascending    thoracic aorta has a diameter of 3.8 cm, image 65 of the coronal series.  At the level of the aortic arch    the thoracic aorta has a maximum diameter of 3.2 cm.  The descending thoracic aorta has a maximum           diameter of 3.2 cm.  There is no evidence for aortic disse        . GERD (gastroesophageal reflux disease)   . Hypokalemia   . Helicobacter pylori gastritis     (Tx Pylera 4/10)  . Cardiomyopathy      01/2009 - Cath - nonobs. dzs.  EF 35%.  . Gallstones     s/p cholecystectomy    . Sleep apnea     occasionally wears CPAP at night  . CAD (coronary artery disease)   . Aortic root enlargement     The aortic root has a diameter measuring 4.6 cm, image 64 of the coronal series.  The ascending  thoracic aorta has a diameter of 3.8 cm, image 65 of the coronal series.  At the level of the aortic arch   the thoracic aorta has a maximum diameter of 3.2 cm.  The descending thoracic aorta has a maximum   diameter of 3.2 cm.  There is no evidence for aortic dissection.       . Iron deficiency anemia   . CHF (congestive heart failure)   . Obesity, Class III, BMI 40-49.9 (morbid obesity)   . Heart murmur   . PONV (postoperative nausea and vomiting)   . 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 Diabetes mellitus    Past Surgical History  Procedure Date  . Cholecystectomy   . Adrenalectomy   . Colonoscopy w/ biopsies and polypectomy 11/04/2008    colon polyps, diverticulosis   . Upper gastrointestinal endoscopy 11/04/2008    w/bx, H pylori gastritis  . Upper gastrointestinal endoscopy 09/25/2011  . Colonoscopy 09/25/2011  . Multiple tooth extractions   . Tee without cardioversion 11/22/2011    Procedure: TRANSESOPHAGEAL ECHOCARDIOGRAM (TEE);  Surgeon: Laurey Morale, MD;  Location: [facility] ENDOSCOPY;  Service: Cardiovascular;  Laterality: N/A;  . Cardiac catheterization     2013  . Cardiac valve replacement 12/28/2011    Bentall with tissue valve  . Coronary artery bypass graft 12/28/2011    Procedure: CORONARY ARTERY BYPASS GRAFTING (CABG);  Surgeon: Purcell Nails, MD;  Location: [facility] OR;  Service: Open Heart Surgery;  Laterality: N/A;  cabg x 1  . Bentall procedure 12/28/2011    Procedure: BENTALL PROCEDURE;  Surgeon: Purcell Nails, MD;  Location: [facility] OR;  Service: Open Heart Surgery;  Laterality: N/A;    reports that he has never smoked. He has never used smokeless tobacco. He reports that he does not drink alcohol or use illicit drugs. family history includes Breast cancer in his sisters; Colon cancer in his sister; Colon cancer (age of onset:70) in his brother; Diabetes in his sister; Hypertension in his mother; Kidney failure in his sister; Prostate cancer in his brother; Stomach cancer in his sister; and Stroke in his father. Allergies  Allergen Reactions  . Latex Hives    Latex gloves       Review of Systems see hpi     Objective:   Physical Exam  Physical Exam  Nursing note and vitals reviewed. Constitutional: Appears well-developed and well-nourished. No distress.  HENT:  Head: Normocephalic and atraumatic.  Right Ear: External ear normal.  Left Ear: External  ear normal.  Eyes: Conjunctivae are normal. No scleral icterus.  Neck: Neck supple. Carotid bruit is not present.  Cardiovascular: Normal rate, regular rhythm and normal heart sounds.  Exam reveals no gallop and no friction rub.  3/6 sm.  Pulmonary/Chest: Effort normal and breath sounds normal. No respiratory distress. He has no wheezes. no rales.  Lymphadenopathy:    He has no cervical adenopathy.  Neurological:Alert.  Skin: Skin is warm and dry. Not diaphoretic. right calf-healed wound. No opening or drainage. Psychiatric: Has a normal mood and affect.       Assessment & Plan:

## 2012-04-26 NOTE — Assessment & Plan Note (Signed)
Encouraged continued weight loss. Obtain chem7 and a1c prior to next visit

## 2012-04-26 NOTE — Patient Instructions (Signed)
Please schedule fasting labs prior to next visit Cbc-anemia, chem7-v58.69, a1c-hyperglycemia and lipid-dyslipidemia

## 2012-04-29 ENCOUNTER — Ambulatory Visit (INDEPENDENT_AMBULATORY_CARE_PROVIDER_SITE_OTHER): Payer: Medicare Other | Admitting: Thoracic Surgery (Cardiothoracic Vascular Surgery)

## 2012-04-29 ENCOUNTER — Encounter: Payer: Self-pay | Admitting: Thoracic Surgery (Cardiothoracic Vascular Surgery)

## 2012-04-29 ENCOUNTER — Encounter (HOSPITAL_COMMUNITY)
Admission: RE | Admit: 2012-04-29 | Discharge: 2012-04-29 | Disposition: A | Discharge status: Home / Self Care | Payer: Medicare Other | Source: Ambulatory Visit | Attending: Cardiovascular Disease | Admitting: Cardiovascular Disease

## 2012-04-29 VITALS — BP 138/87 | HR 91 | Resp 18 | Ht 74.0 in | Wt 303.0 lb

## 2012-04-29 DIAGNOSIS — I359 Nonrheumatic aortic valve disorder, unspecified: Secondary | ICD-10-CM | POA: Diagnosis not present

## 2012-04-29 DIAGNOSIS — Z954 Presence of other heart-valve replacement: Secondary | ICD-10-CM

## 2012-04-29 DIAGNOSIS — I712 Thoracic aortic aneurysm, without rupture: Secondary | ICD-10-CM | POA: Diagnosis not present

## 2012-04-29 DIAGNOSIS — Z951 Presence of aortocoronary bypass graft: Secondary | ICD-10-CM | POA: Diagnosis not present

## 2012-04-29 DIAGNOSIS — Z952 Presence of prosthetic heart valve: Secondary | ICD-10-CM

## 2012-04-29 NOTE — Progress Notes (Signed)
[address]            David Parsons [address]          [phone]     CARDIOTHORACIC SURGERY OFFICE NOTE  Referring Provider is Verne Carrow , MD PCP is Letitia Libra, Ala Dach, MD   HPI:   Patient returns for followup status post biological Bentall aortic root replacement with resection and grafting of ascending thoracic aortic aneurysm and coronary artery bypass grafting x1 on 12/28/2011. He has done remarkably well postoperatively.  He was last seen here in our office on 01/22/2012. Since then he has continued to do very well. He is now almost finished the cardiac rehabilitation program and he notes that his exercise tolerance is much better than it used to be. He has lost a total of 30 pounds in weight. He feels great. He has no pain and he states that he doesn't feel like he ever had surgery. Overall he feels much better than he has many years.  Current Outpatient Prescriptions  Medication Sig Dispense Refill  . amLODipine (NORVASC) 10 MG tablet Take 1 tablet (10 mg total) by mouth daily.  30 tablet  11  . aspirin EC 81 MG tablet Take 81 mg by mouth daily.      . carvedilol (COREG) 25 MG tablet Take 1 tablet (25 mg total) by mouth 2 (two) times daily with a meal.  60 tablet  3  . ferrous sulfate 325 (65 FE) MG tablet Take 325 mg by mouth 2 (two) times daily before a meal.      . furosemide (LASIX) 40 MG tablet Take 1 tablet (40 mg total) by mouth 2 (two) times daily.  60 tablet  6  . Multiple Vitamin (MULITIVITAMIN WITH MINERALS) TABS Take 1 tablet by mouth daily.      Marland Kitchen omeprazole (PRILOSEC) 20 MG capsule Take 20 mg by mouth daily.       . potassium chloride SA (K-DUR,KLOR-CON) 20 MEQ tablet Take 2 tablets (40 mEq total) by mouth 3 (three) times daily.  180 tablet  6  . telmisartan-hydrochlorothiazide (MICARDIS HCT) 80-25 MG per tablet Take 1 tablet by mouth daily.  30 tablet  6      Physical Exam:   BP 138/87  Pulse 91  Resp 18   Ht 6\' 2"  (1.88 m)  Wt 303 lb (137.44 kg)  BMI 38.90 kg/m2  SpO2 97%  General:  Well-appearing  Chest:   Clear to auscultation with symmetrical breath sounds  CV:   Regular rate and rhythm  Incisions:  Completely healed  Abdomen:  Soft and nontender  Extremities:  Warm and well-perfused  Diagnostic Tests:  ------------------------------------------------------------ Transthoracic Echocardiography  Patient: David Parsons, David Parsons MR #: [id number] Study Date: 04/02/2012 Gender: M Age: 71 Height: 180.3cm Weight: 141.1kg BSA: 2.79m^2 Pt. Status: Room:  ATTENDING Charlton Haws SONOGRAPHER Domenica Fail Alona Bene PERFORMING [facility] ORDERING McAlhany, Christopher REFERRING [place], Christopher cc:  ------------------------------------------------------------ LV EF: 55%  ------------------------------------------------------------ Indications: CAD of native vessels 414.01.  ------------------------------------------------------------ History: PMH: PFO by TEE. Acquired from the patient and from the patient's chart. Coronary artery disease. Non-ischemic cardiomyopathy. Congestive heart failure. Risk factors: Hypertension.  ------------------------------------------------------------ Study Conclusions  - Left ventricle: The cavity size was normal. There was severe concentric hypertrophy. The estimated ejection fraction was 55%. - Aortic valve: Normal appearing tissue AVR - Left atrium: The atrium was mildly  dilated. - Atrial septum: Poorly visualized Cannot R/O PFO - Pericardium, extracardiac: Small mostly posterior pericardial effusion  ------------------------------------------------------------ Labs, prior tests, procedures, and surgery: Aortoplasty. Coronary artery bypass grafting. Aortic valve replacement with a bioprosthetic valve. Transthoracic echocardiography. M-mode, complete 2D, spectral Doppler, and color Doppler. Height: Height: 180.3cm. Height: 71in.  Weight: Weight: 141.1kg. Weight: 310.4lb. Body mass index: BMI: 43.4kg/m^2. Body surface area: BSA: 2.12m^2. Blood pressure: 180/106. Patient status: Outpatient. Location: [facility]  ------------------------------------------------------------  ------------------------------------------------------------ Left ventricle: The cavity size was normal. There was severe concentric hypertrophy. The estimated ejection fraction was 55%.  ------------------------------------------------------------ Aortic valve: Normal appearing tissue AVR Doppler: Mean gradient: 16mm Hg (S). Peak gradient: 25mm Hg (S).  ------------------------------------------------------------ Aorta: The aorta was normal, not dilated, and non-diseased.  ------------------------------------------------------------ Mitral valve: Mildly thickened leaflets .  ------------------------------------------------------------ Left atrium: The atrium was mildly dilated.  ------------------------------------------------------------ Atrial septum: Poorly visualized Cannot R/O PFO  ------------------------------------------------------------ Right ventricle: The cavity size was normal. Wall thickness was normal. Systolic function was normal.  ------------------------------------------------------------ Pulmonic valve: Doppler: Trivial regurgitation.  ------------------------------------------------------------ Tricuspid valve: Doppler: Mild regurgitation.  ------------------------------------------------------------ Right atrium: The atrium was normal in size.  ------------------------------------------------------------ Pericardium: Small mostly posterior pericardial effusion  ------------------------------------------------------------ Post procedure conclusions Ascending Aorta:  - The aorta was normal, not dilated, and non-diseased.  ------------------------------------------------------------  2D  measurements Normal Doppler Normal Left ventricle measurements LVID ED, 49.3 mm 43-52 Left ventricle chord, Ea, lat 3.59 cm/ ------- PLAX ann, tiss s LVID ES, 30.9 mm 23-38 DP chord, E/Ea, lat 19.53 ------- PLAX ann, tiss FS, chord, 37 % >29 DP PLAX Ea, med 3.18 cm/ ------- LVPW, ED 16.5 mm ------ ann, tiss s IVS/LVPW 1.02 <1.3 DP ratio, ED E/Ea, med 22.04 ------- Ventricular septum ann, tiss IVS, ED 16.8 mm ------ DP Aorta Aortic valve Root diam, 35 mm ------ Peak vel, S 248 cm/ ------- ED s Left atrium Mean vel, S 184 cm/ ------- AP dim 50 mm ------ s AP dim 1.97 cm/m^2 <2.2 VTI, S 46.6 cm ------- index Mean 16 mm ------- gradient, S Hg Peak 25 mm ------- gradient, S Hg Mitral valve Peak E vel 70.1 cm/ ------- s Peak A vel 108 cm/ ------- s Deceleratio 127 ms 150-230 n time Peak E/A 0.6 ------- ratio Systemic veins Estimated 5 mm ------- CVP Hg Right ventricle Sa vel, lat 5.37 cm/ ------- ann, tiss s DP  ------------------------------------------------------------ Prepared and Electronically Authenticated by  Charlton Haws 2013-08-13T12:05:[id number]    Impression:  The patient is doing exceptionally well following biological Bentall aortic root replacement with resection and grafting of ascending thoracic aortic aneurysm and coronary artery bypass grafting x1.  Plan:  In the future David Parsons will call and return to see [place] as needed.   Salvatore Decent. Cornelius Moras, MD 04/29/2012 11:42 AM

## 2012-05-01 ENCOUNTER — Encounter (HOSPITAL_COMMUNITY)
Admission: RE | Admit: 2012-05-01 | Discharge: 2012-05-01 | Disposition: A | Discharge status: Home / Self Care | Payer: Medicare Other | Source: Ambulatory Visit | Attending: Cardiovascular Disease | Admitting: Cardiovascular Disease

## 2012-05-03 ENCOUNTER — Encounter (HOSPITAL_COMMUNITY)
Admission: RE | Admit: 2012-05-03 | Discharge: 2012-05-03 | Disposition: A | Discharge status: Home / Self Care | Payer: Medicare Other | Source: Ambulatory Visit | Attending: Cardiovascular Disease | Admitting: Cardiovascular Disease

## 2012-05-06 ENCOUNTER — Encounter (HOSPITAL_COMMUNITY)
Admission: RE | Admit: 2012-05-06 | Discharge: 2012-05-06 | Disposition: A | Discharge status: Home / Self Care | Payer: Medicare Other | Source: Ambulatory Visit | Attending: Cardiovascular Disease | Admitting: Cardiovascular Disease

## 2012-05-08 ENCOUNTER — Ambulatory Visit (INDEPENDENT_AMBULATORY_CARE_PROVIDER_SITE_OTHER): Payer: Medicare Other | Admitting: Cardiovascular Disease

## 2012-05-08 ENCOUNTER — Encounter: Payer: Self-pay | Admitting: Cardiovascular Disease

## 2012-05-08 VITALS — BP 140/82 | HR 80 | Ht 73.0 in | Wt 308.0 lb

## 2012-05-08 DIAGNOSIS — I712 Thoracic aortic aneurysm, without rupture, unspecified: Secondary | ICD-10-CM

## 2012-05-08 DIAGNOSIS — I2510 Atherosclerotic heart disease of native coronary artery without angina pectoris: Secondary | ICD-10-CM | POA: Diagnosis not present

## 2012-05-08 DIAGNOSIS — I10 Essential (primary) hypertension: Secondary | ICD-10-CM

## 2012-05-08 DIAGNOSIS — Z954 Presence of other heart-valve replacement: Secondary | ICD-10-CM

## 2012-05-08 DIAGNOSIS — Z952 Presence of prosthetic heart valve: Secondary | ICD-10-CM

## 2012-05-08 NOTE — Progress Notes (Signed)
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 He was referred in May 2010 for SOB, abdominal swelling and LE edema. He was diagnosed with a non-ischemic CM, moderate AI, non-obstructive CAD and a dilated aortic root. He underwent a right and left heart cath June 2010 with non-obstructive coronary artery disease. A CT angiogram was performed in June 2010 to look closely at his aortic root and showed moderate dilation of the aortic root (root 4.6 cm, ascending aorta 3.8cm). Echo in the spring of 2011 showed enlarging aortic root. I planned on performing an MRI to look at his valve, degree of AI and size of root but he was too large for the scanner. His aortic root continued to enlarge. I saw him 11/13/11 and arranged a cardiac cath on 11/22/11 which showed enlarged root, no obstructive disease in the left system. I could not engage the RCA secondary to the abnormal takeoff with the enlarged aortic root. CTA was performed and showed dilated aortic root with patent RCA. He underwent aortic root replacement, aortic valve replacement and SVG to RCA on 12/28/11. He did well following the operation and was last seen in our office August 2013. He has been in cardiac rehab. His BP meds were held following surgery but his BP has been elevated so he has been started back on his BP medications. His BP at home has been 120-130/80s.   He is here today for follow up. No chest pain or SOB. No near syncope or syncope. He feels great. He has lost 30 pounds. He has been exercising at cardiac rehab and doing well.   Primary Care Physician: David Parsons   Primary Care Physician:  Last Lipid Profile:Lipid Panel     Component Value Date/Time   CHOL 123 05/15/2011 0835   TRIG 64  05/15/2011 0835   HDL 33* 05/15/2011 0835   CHOLHDL 3.7 05/15/2011 0835   VLDL 13 05/15/2011 0835   LDLCALC 77 05/15/2011 0835     Past Medical History  Diagnosis Date  . Hypertension   . Aortic insufficiency      02/16/09 Chest CT:  The aortic root has a diameter measuring 4.6 cm, image 64 of the coronal series.  The ascending    thoracic aorta has a diameter of 3.8 cm, image 65 of the coronal series.  At the level of the aortic arch    the thoracic aorta has a maximum diameter of 3.2 cm.  The descending thoracic aorta has a maximum           diameter of 3.2 cm.  There is no evidence for aortic disse        . GERD (gastroesophageal reflux disease)   . Hypokalemia   . Helicobacter pylori gastritis     (Tx Pylera 4/10)  . Cardiomyopathy      01/2009 - Cath - nonobs. dzs.  EF 35%.  . Gallstones     s/p cholecystectomy    . Sleep apnea     occasionally wears CPAP at night  . CAD (coronary artery disease)   . Aortic root enlargement     The aortic root has a diameter measuring 4.6 cm, image 64 of the coronal series.  The ascending  thoracic aorta has a diameter  of 3.8 cm, image 65 of the coronal series.  At the level of the aortic arch   the thoracic aorta has a maximum diameter of 3.2 cm.  The descending thoracic aorta has a maximum   diameter of 3.2 cm.  There is no evidence for aortic dissection.       . Iron deficiency anemia   . CHF (congestive heart failure)   . Obesity, Class III, BMI 40-49.9 (morbid obesity)   . Heart murmur   . PONV (postoperative nausea and vomiting)   . 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 Diabetes mellitus     Past Surgical History  Procedure Date  . Cholecystectomy   . Adrenalectomy   . Colonoscopy w/  biopsies and polypectomy 11/04/2008    colon polyps, diverticulosis   . Upper gastrointestinal endoscopy 11/04/2008    w/bx, H pylori gastritis  . Upper gastrointestinal endoscopy 09/25/2011  . Colonoscopy 09/25/2011  . Multiple tooth extractions   . Tee without cardioversion 11/22/2011    Procedure: TRANSESOPHAGEAL ECHOCARDIOGRAM (TEE);  Surgeon: Laurey Morale, MD;  Location: [facility] ENDOSCOPY;  Service: Cardiovascular;  Laterality: N/A;  . Cardiac catheterization     2013  . Cardiac valve replacement 12/28/2011    Bentall with tissue valve  . Coronary artery bypass graft 12/28/2011    Procedure: CORONARY ARTERY BYPASS GRAFTING (CABG);  Surgeon: Purcell Nails, MD;  Location: [facility] OR;  Service: Open Heart Surgery;  Laterality: N/A;  cabg x 1  . Bentall procedure 12/28/2011    Procedure: BENTALL PROCEDURE;  Surgeon: Purcell Nails, MD;  Location: [facility] OR;  Service: Open Heart Surgery;  Laterality: N/A;    Current Outpatient Prescriptions  Medication Sig Dispense Refill  . amLODipine (NORVASC) 10 MG tablet Take 1 tablet (10 mg total) by mouth daily.  30 tablet  11  . aspirin EC 81 MG tablet Take 81 mg by mouth daily.      . carvedilol (COREG) 25 MG tablet Take 1 tablet (25 mg total) by mouth 2 (two) times daily with a meal.  60 tablet  3  . ferrous sulfate 325 (65 FE) MG tablet Take 325 mg by mouth 2 (two) times daily before a meal.      . furosemide (LASIX) 40 MG tablet Take 1 tablet (40 mg total) by mouth 2 (two) times daily.  60 tablet  6  . Multiple Vitamin (MULITIVITAMIN WITH MINERALS) TABS Take 1 tablet by mouth daily.      Marland Kitchen omeprazole (PRILOSEC) 20 MG capsule Take 20 mg by mouth daily.       . potassium chloride SA (K-DUR,KLOR-CON) 20 MEQ tablet Take 2 tablets (40 mEq total) by mouth 3 (three) times daily.  180 tablet  6  . telmisartan-hydrochlorothiazide (MICARDIS HCT) 80-25 MG per tablet Take 1 tablet by mouth daily.  30 tablet  6    Allergies  Allergen Reactions  . Latex Hives    Latex  gloves     History   Social History  . Marital Status: Married    Spouse Name: David Parsons    Number of Children: 0  . Years of Education: N/A   Occupational History  .      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 Smoking status: Never Smoker   . Smokeless tobacco: Never Used  . Alcohol Use: No  . Drug Use: No  . Sexually Active: Not on file   Other Topics Concern  . Not on file   Social History Narrative   The patient is married, lives with his wife in  [place].  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  He  lives a very sedentary lifestyle.  He is a nonsmoker.  He denies alcohol consumption.     Family History  Problem Relation Age of Onset  . Breast cancer Sister   . Colon cancer Brother 70    at least 70  . Prostate cancer Brother   . Diabetes Sister     5 siblings  . Stomach cancer Sister   . Hypertension Mother   . Kidney failure Sister   . Colon cancer Sister   . Breast cancer Sister   . Stroke Father     Review of Systems:  As stated in the HPI and otherwise negative.   BP 140/82  Pulse 80  Ht 6\' 1"  (1.854 m)  Wt 308 lb (139.708 kg)  BMI 40.64 kg/m2  Physical Examination: General: Well developed, well nourished, NAD HEENT: OP clear, mucus membranes moist SKIN: warm, dry. No rashes. Neuro: No focal deficits Musculoskeletal: Muscle strength 5/5 all ext Psychiatric: Mood and affect normal Neck: No JVD, no carotid bruits, no thyromegaly, no lymphadenopathy. Lungs:Clear bilaterally, no wheezes, rhonci, crackles Cardiovascular: Regular rate and rhythm. No murmurs, gallops or rubs. Abdomen:Soft. Bowel sounds present. Non-tender.  Extremities: No lower extremity edema. Pulses are 2 + in the bilateral DP/PT.  Echo 04/02/12: Left ventricle: The cavity size was normal. There was severe concentric hypertrophy. The estimated ejection fraction was 55%. - Aortic valve: Normal appearing tissue AVR - Left atrium: The  atrium was mildly dilated. - Atrial septum: Poorly visualized Cannot R/O PFO - Pericardium, extracardiac: Small mostly posterior pericardial effusion   Assessment and Plan:   1. CAD, NATIVE VESSEL: S/p single vessel bypass to RCA. Stable. Continue ASA and beta blocker.   2. Acute on chronic diastolic CHF NYHA class 1: His weight is down 30 pounds over last 2 months. Continue current dose of Lasix.   3. HYPERTENSION: Controlled. Will continue Coreg, Norvasc and Micardis/HCT. Follow BP at home in cardiac rehab.   4. S/P aortic valve replacement and aortoplasty: Per Dr. Cornelius Moras. Stable. LV function normal by echo August 2013. AVR working well.

## 2012-05-08 NOTE — Patient Instructions (Addendum)
Your physician wants you to follow-up in:  12 months.  You will receive a reminder letter in the mail two months in advance. If you don't receive a letter, please call our office to schedule the follow-up appointment.   

## 2012-05-10 ENCOUNTER — Encounter (HOSPITAL_COMMUNITY)
Admission: RE | Admit: 2012-05-10 | Discharge: 2012-05-10 | Disposition: A | Discharge status: Home / Self Care | Payer: Medicare Other | Source: Ambulatory Visit | Attending: Cardiovascular Disease | Admitting: Cardiovascular Disease

## 2012-05-13 ENCOUNTER — Encounter (HOSPITAL_COMMUNITY)
Admission: RE | Admit: 2012-05-13 | Discharge: 2012-05-13 | Disposition: A | Discharge status: Home / Self Care | Payer: Medicare Other | Source: Ambulatory Visit | Attending: Cardiovascular Disease | Admitting: Cardiovascular Disease

## 2012-05-13 NOTE — Progress Notes (Signed)
David Parsons complained of feeling lightheaded this morning on the walking track. Blood pressure 122/60 hr 80.  Patient given water and sat down to rest.  Symptoms resolved.  Patient given Gatorade during exercise.  Exit blood pressure 124/66.  Heart rate 78. Will continue to monitor the patient throughout  the program.

## 2012-05-15 ENCOUNTER — Encounter (HOSPITAL_COMMUNITY)
Admission: RE | Admit: 2012-05-15 | Discharge: 2012-05-15 | Disposition: A | Discharge status: Home / Self Care | Payer: Medicare Other | Source: Ambulatory Visit | Attending: Cardiovascular Disease | Admitting: Cardiovascular Disease

## 2012-05-16 ENCOUNTER — Ambulatory Visit: Payer: Medicare Other | Admitting: Cardiovascular Disease

## 2012-05-17 ENCOUNTER — Encounter (HOSPITAL_COMMUNITY): Payer: Medicare Other

## 2012-05-20 ENCOUNTER — Encounter (HOSPITAL_COMMUNITY)
Admission: RE | Admit: 2012-05-20 | Discharge: 2012-05-20 | Disposition: A | Discharge status: Home / Self Care | Payer: Medicare Other | Source: Ambulatory Visit | Attending: Cardiovascular Disease | Admitting: Cardiovascular Disease

## 2012-05-22 ENCOUNTER — Ambulatory Visit (INDEPENDENT_AMBULATORY_CARE_PROVIDER_SITE_OTHER): Payer: Medicare Other | Admitting: *Deleted

## 2012-05-22 ENCOUNTER — Encounter (HOSPITAL_COMMUNITY)
Admission: RE | Admit: 2012-05-22 | Discharge: 2012-05-22 | Disposition: A | Discharge status: Home / Self Care | Payer: Medicare Other | Source: Ambulatory Visit | Attending: Cardiovascular Disease | Admitting: Cardiovascular Disease

## 2012-05-22 DIAGNOSIS — Z951 Presence of aortocoronary bypass graft: Secondary | ICD-10-CM | POA: Diagnosis not present

## 2012-05-22 DIAGNOSIS — Z23 Encounter for immunization: Secondary | ICD-10-CM | POA: Diagnosis not present

## 2012-05-22 DIAGNOSIS — G4730 Sleep apnea, unspecified: Secondary | ICD-10-CM | POA: Insufficient documentation

## 2012-05-22 DIAGNOSIS — I712 Thoracic aortic aneurysm, without rupture, unspecified: Secondary | ICD-10-CM | POA: Insufficient documentation

## 2012-05-22 DIAGNOSIS — I2510 Atherosclerotic heart disease of native coronary artery without angina pectoris: Secondary | ICD-10-CM | POA: Diagnosis not present

## 2012-05-22 DIAGNOSIS — Z5189 Encounter for other specified aftercare: Secondary | ICD-10-CM | POA: Diagnosis not present

## 2012-05-22 DIAGNOSIS — Z954 Presence of other heart-valve replacement: Secondary | ICD-10-CM | POA: Insufficient documentation

## 2012-05-22 DIAGNOSIS — I359 Nonrheumatic aortic valve disorder, unspecified: Secondary | ICD-10-CM | POA: Insufficient documentation

## 2012-05-22 DIAGNOSIS — I509 Heart failure, unspecified: Secondary | ICD-10-CM | POA: Diagnosis not present

## 2012-05-22 DIAGNOSIS — I428 Other cardiomyopathies: Secondary | ICD-10-CM | POA: Diagnosis not present

## 2012-05-22 DIAGNOSIS — I4892 Unspecified atrial flutter: Secondary | ICD-10-CM | POA: Diagnosis not present

## 2012-05-22 DIAGNOSIS — K219 Gastro-esophageal reflux disease without esophagitis: Secondary | ICD-10-CM | POA: Insufficient documentation

## 2012-05-22 DIAGNOSIS — I10 Essential (primary) hypertension: Secondary | ICD-10-CM | POA: Insufficient documentation

## 2012-05-24 ENCOUNTER — Encounter (HOSPITAL_COMMUNITY)
Admission: RE | Admit: 2012-05-24 | Discharge: 2012-05-24 | Disposition: A | Discharge status: Home / Self Care | Payer: Medicare Other | Source: Ambulatory Visit | Attending: Cardiovascular Disease | Admitting: Cardiovascular Disease

## 2012-05-24 NOTE — Progress Notes (Signed)
David Parsons graduates today.  David Parsons plans to continue exercise in the maintenance program

## 2012-05-27 ENCOUNTER — Encounter (HOSPITAL_COMMUNITY): Payer: Medicare Other

## 2012-05-29 ENCOUNTER — Encounter (HOSPITAL_COMMUNITY): Payer: Medicare Other

## 2012-05-31 ENCOUNTER — Encounter (HOSPITAL_COMMUNITY): Payer: Medicare Other

## 2012-06-28 DIAGNOSIS — B351 Tinea unguium: Secondary | ICD-10-CM | POA: Diagnosis not present

## 2012-06-28 DIAGNOSIS — M79609 Pain in unspecified limb: Secondary | ICD-10-CM | POA: Diagnosis not present

## 2012-07-22 ENCOUNTER — Other Ambulatory Visit: Payer: Self-pay | Admitting: Internal Medicine

## 2012-07-22 ENCOUNTER — Telehealth: Payer: Self-pay | Admitting: *Deleted

## 2012-07-22 DIAGNOSIS — L910 Hypertrophic scar: Secondary | ICD-10-CM

## 2012-07-22 NOTE — Telephone Encounter (Signed)
Patient informed/SLS  

## 2012-07-22 NOTE — Telephone Encounter (Signed)
Pt requesting Referral to Dermatology d/t Keloid at site of "open heart surgery scar with redness & itching"/SLS Please advise.

## 2012-07-22 NOTE — Telephone Encounter (Signed)
Referral order placed.

## 2012-08-05 DIAGNOSIS — L905 Scar conditions and fibrosis of skin: Secondary | ICD-10-CM | POA: Diagnosis not present

## 2012-08-27 ENCOUNTER — Ambulatory Visit: Payer: Medicare Other | Admitting: Internal Medicine

## 2012-08-30 ENCOUNTER — Encounter: Payer: Self-pay | Admitting: Internal Medicine

## 2012-08-30 ENCOUNTER — Ambulatory Visit (INDEPENDENT_AMBULATORY_CARE_PROVIDER_SITE_OTHER): Payer: Medicare Other | Admitting: Internal Medicine

## 2012-08-30 VITALS — BP 132/80 | HR 74 | Temp 97.8°F | Resp 16 | Wt 315.2 lb

## 2012-08-30 DIAGNOSIS — E785 Hyperlipidemia, unspecified: Secondary | ICD-10-CM | POA: Diagnosis not present

## 2012-08-30 DIAGNOSIS — D509 Iron deficiency anemia, unspecified: Secondary | ICD-10-CM | POA: Diagnosis not present

## 2012-08-30 DIAGNOSIS — E66813 Obesity, class 3: Secondary | ICD-10-CM

## 2012-08-30 DIAGNOSIS — R7309 Other abnormal glucose: Secondary | ICD-10-CM

## 2012-08-30 DIAGNOSIS — Z79899 Other long term (current) drug therapy: Secondary | ICD-10-CM | POA: Diagnosis not present

## 2012-08-30 MED ORDER — OMEPRAZOLE 40 MG PO CPDR: 40.0000 mg | DELAYED_RELEASE_CAPSULE | Freq: Every day | ORAL | Status: DC

## 2012-08-30 NOTE — Telephone Encounter (Signed)
Lab orders entered for scheduled 01.10.14 OV/SLS

## 2012-08-31 LAB — CBC
HCT: 43.8 % (ref 39.0–52.0)
Hemoglobin: 14.7 g/dL (ref 13.0–17.0)
MCH: 30 pg (ref 26.0–34.0)
MCV: 89.4 fL (ref 78.0–100.0)
RBC: 4.9 MIL/uL (ref 4.22–5.81)

## 2012-08-31 LAB — BASIC METABOLIC PANEL
Chloride: 102 mEq/L (ref 96–112)
Creat: 0.92 mg/dL (ref 0.50–1.35)
Potassium: 4 mEq/L (ref 3.5–5.3)

## 2012-08-31 LAB — LIPID PANEL
HDL: 41 mg/dL (ref >39)
LDL Cholesterol: 104 mg/dL — ABNORMAL HIGH (ref 0–99)
Total CHOL/HDL Ratio: 3.9 Ratio
Triglycerides: 72 mg/dL (ref <150)
VLDL: 14 mg/dL (ref 0–40)

## 2012-08-31 LAB — HEPATIC FUNCTION PANEL
Albumin: 4.1 g/dL (ref 3.5–5.2)
Total Protein: 7.1 g/dL (ref 6.0–8.3)

## 2012-09-01 NOTE — Assessment & Plan Note (Signed)
Obtain chem7 and a1c 

## 2012-09-01 NOTE — Progress Notes (Signed)
Subjective:    Patient ID: David Parsons, male    DOB: [birth year], 72 y.o.   MRN: [id number]  HPI Pt presents to clinic for followup of multiple medical problems. Weight up 10lbs. Is resuming cardiac rehab soon. Receiving steroid injections to chest keloid by dermatology.   Past Medical History  Diagnosis Date  . Hypertension   . Aortic insufficiency      02/16/09 Chest CT:  The aortic root has a diameter measuring 4.6 cm, image 64 of the coronal series.  The ascending    thoracic aorta has a diameter of 3.8 cm, image 65 of the coronal series.  At the level of the aortic arch    the thoracic aorta has a maximum diameter of 3.2 cm.  The descending thoracic aorta has a maximum           diameter of 3.2 cm.  There is no evidence for aortic disse        . GERD (gastroesophageal reflux disease)   . Hypokalemia   . Helicobacter pylori gastritis     (Tx Pylera 4/10)  . Cardiomyopathy      01/2009 - Cath - nonobs. dzs.  EF 35%.  . Gallstones     s/p cholecystectomy    . Sleep apnea     occasionally wears CPAP at night  . CAD (coronary artery disease)   . Aortic root enlargement     The aortic root has a diameter measuring 4.6 cm, image 64 of the coronal series.  The ascending  thoracic aorta has a diameter of 3.8 cm, image 65 of the coronal series.  At the level of the aortic arch   the thoracic aorta has a maximum diameter of 3.2 cm.  The descending thoracic aorta has a maximum   diameter of 3.2 cm.  There is no evidence for aortic dissection.       . Iron deficiency anemia   . CHF (congestive heart failure)   . Obesity, Class III, BMI 40-49.9 (morbid obesity)   . Heart murmur   . PONV (postoperative nausea and vomiting)   . 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    Biological Bentall Aortic Root Replacement using 25 mm Edwards Magna Ease pericardial tissue valve and 28 mm Vascutek Gelweave Valsalva conduit with reimplantation of left main and right coronary artery and CABG x1  using SVG to RCA  . Diabetes mellitus    Past Surgical History  Procedure Date  . Cholecystectomy   . Adrenalectomy   . Colonoscopy w/ biopsies and polypectomy 11/04/2008    colon polyps, diverticulosis   . Upper gastrointestinal endoscopy 11/04/2008    w/bx, H pylori gastritis  . Upper gastrointestinal endoscopy 09/25/2011  . Colonoscopy 09/25/2011  . Multiple tooth extractions   . Tee without cardioversion 11/22/2011    Procedure: TRANSESOPHAGEAL ECHOCARDIOGRAM (TEE);  Surgeon: Laurey Morale, MD;  Location: [facility] ENDOSCOPY;  Service: Cardiovascular;  Laterality: N/A;  . Cardiac catheterization     2013  . Cardiac valve replacement 12/28/2011    Bentall with tissue valve  . Coronary artery bypass graft 12/28/2011    Procedure: CORONARY ARTERY BYPASS GRAFTING (CABG);  Surgeon: Purcell Nails, MD;  Location: [facility] OR;  Service: Open Heart Surgery;  Laterality: N/A;  cabg x 1  . Bentall procedure 12/28/2011    Procedure: BENTALL PROCEDURE;  Surgeon: Purcell Nails, MD;  Location: [facility] OR;  Service: Open Heart Surgery;  Laterality: N/A;    reports that  he has never smoked. He has never used smokeless tobacco. He reports that he does not drink alcohol or use illicit drugs. family history includes Breast cancer in his sisters; Colon cancer in his sister; Colon cancer (age of onset:70) in his brother; Diabetes in his sister; Hypertension in his mother; Kidney failure in his sister; Prostate cancer in his brother; Stomach cancer in his sister; and Stroke in his father. Allergies  Allergen Reactions  . Latex Hives    Latex gloves       Review of Systems see hpi     Objective:   Physical Exam  Physical Exam  Nursing note and vitals reviewed. Constitutional: Appears well-developed and well-nourished. No distress.  HENT:  Head: Normocephalic and atraumatic.  Right Ear: External ear normal.  Left Ear: External ear normal.  Eyes: Conjunctivae are normal. No scleral icterus.  Neck: Neck supple.  Carotid bruit is not present.  Cardiovascular: Normal rate, regular rhythm and normal heart sounds.  Exam reveals no gallop and no friction rub.   No murmur heard. Pulmonary/Chest: Effort normal and breath sounds normal. No respiratory distress. He has no wheezes. no rales.  Lymphadenopathy:    He has no cervical adenopathy.  Neurological:Alert.  Skin: Skin is warm and dry. Not diaphoretic.  Psychiatric: Has a normal mood and affect.        Assessment & Plan:

## 2012-09-01 NOTE — Assessment & Plan Note (Signed)
Resumed supervised exercise

## 2012-09-02 ENCOUNTER — Encounter (HOSPITAL_COMMUNITY): Payer: Self-pay | Attending: Cardiovascular Disease

## 2012-09-04 ENCOUNTER — Telehealth (HOSPITAL_COMMUNITY): Payer: Self-pay | Admitting: Cardiac Rehabilitation

## 2012-09-04 ENCOUNTER — Encounter (HOSPITAL_COMMUNITY): Admission: RE | Admit: 2012-09-04 | Payer: Self-pay | Source: Ambulatory Visit

## 2012-09-04 DIAGNOSIS — L910 Hypertrophic scar: Secondary | ICD-10-CM | POA: Diagnosis not present

## 2012-09-04 NOTE — Telephone Encounter (Signed)
pc to assess reason for absence from cardiac maintenance this week.  Left message with pt wife.

## 2012-09-06 ENCOUNTER — Encounter (HOSPITAL_COMMUNITY): Payer: Self-pay

## 2012-09-09 ENCOUNTER — Encounter (HOSPITAL_COMMUNITY): Payer: Self-pay

## 2012-09-11 ENCOUNTER — Other Ambulatory Visit: Payer: Self-pay | Admitting: Physician Assistant

## 2012-09-11 ENCOUNTER — Encounter (HOSPITAL_COMMUNITY): Payer: Self-pay

## 2012-09-13 ENCOUNTER — Encounter (HOSPITAL_COMMUNITY): Payer: Self-pay

## 2012-09-16 ENCOUNTER — Encounter (HOSPITAL_COMMUNITY): Admission: RE | Admit: 2012-09-16 | Payer: Self-pay | Source: Ambulatory Visit

## 2012-09-18 ENCOUNTER — Encounter (HOSPITAL_COMMUNITY): Payer: Self-pay

## 2012-09-18 ENCOUNTER — Telehealth: Payer: Self-pay | Admitting: Internal Medicine

## 2012-09-18 MED ORDER — HYDROCHLOROTHIAZIDE 25 MG PO TABS: 25.0000 mg | ORAL_TABLET | Freq: Every day | ORAL | Status: DC

## 2012-09-18 NOTE — Telephone Encounter (Signed)
Patients wife called in requesting samples of micardis for patient

## 2012-09-18 NOTE — Telephone Encounter (Signed)
Pt stopped by the office earlier for Micardis 80mg  samples. Notes was left stating pt was given samples of Micardis 80mg  #28 tablets. Upon review of medication list it appears pt is actually taking Micardis HCT 80/25mg . We are currently out of Micardis HCT samples. Notified pt per Provider instruction that we will send an Rx for HCTZ 25mg  1 tablet daily, #30 x no refills and he will take this in addition to Micardis sample he received today. Pt and wife voice understanding. Rx sent.

## 2012-09-20 ENCOUNTER — Encounter (HOSPITAL_COMMUNITY): Payer: Self-pay

## 2012-09-20 ENCOUNTER — Telehealth: Payer: Self-pay

## 2012-09-20 DIAGNOSIS — I712 Thoracic aortic aneurysm, without rupture: Secondary | ICD-10-CM

## 2012-09-20 DIAGNOSIS — I10 Essential (primary) hypertension: Secondary | ICD-10-CM

## 2012-09-20 MED ORDER — CARVEDILOL 25 MG PO TABS: 25.0000 mg | ORAL_TABLET | Freq: Two times a day (BID) | ORAL | Status: DC

## 2012-09-23 ENCOUNTER — Encounter (HOSPITAL_COMMUNITY)
Admission: RE | Admit: 2012-09-23 | Discharge: 2012-09-23 | Disposition: A | Discharge status: Home / Self Care | Payer: Self-pay | Source: Ambulatory Visit | Attending: Cardiovascular Disease | Admitting: Cardiovascular Disease

## 2012-09-23 DIAGNOSIS — K219 Gastro-esophageal reflux disease without esophagitis: Secondary | ICD-10-CM | POA: Insufficient documentation

## 2012-09-23 DIAGNOSIS — I712 Thoracic aortic aneurysm, without rupture, unspecified: Secondary | ICD-10-CM | POA: Insufficient documentation

## 2012-09-23 DIAGNOSIS — I4892 Unspecified atrial flutter: Secondary | ICD-10-CM | POA: Insufficient documentation

## 2012-09-23 DIAGNOSIS — I2510 Atherosclerotic heart disease of native coronary artery without angina pectoris: Secondary | ICD-10-CM | POA: Insufficient documentation

## 2012-09-23 DIAGNOSIS — Z951 Presence of aortocoronary bypass graft: Secondary | ICD-10-CM | POA: Insufficient documentation

## 2012-09-23 DIAGNOSIS — Z5189 Encounter for other specified aftercare: Secondary | ICD-10-CM | POA: Insufficient documentation

## 2012-09-23 DIAGNOSIS — I359 Nonrheumatic aortic valve disorder, unspecified: Secondary | ICD-10-CM | POA: Insufficient documentation

## 2012-09-23 DIAGNOSIS — I10 Essential (primary) hypertension: Secondary | ICD-10-CM | POA: Insufficient documentation

## 2012-09-23 DIAGNOSIS — I428 Other cardiomyopathies: Secondary | ICD-10-CM | POA: Insufficient documentation

## 2012-09-23 DIAGNOSIS — I509 Heart failure, unspecified: Secondary | ICD-10-CM | POA: Insufficient documentation

## 2012-09-23 DIAGNOSIS — Z954 Presence of other heart-valve replacement: Secondary | ICD-10-CM | POA: Insufficient documentation

## 2012-09-23 DIAGNOSIS — G4730 Sleep apnea, unspecified: Secondary | ICD-10-CM | POA: Insufficient documentation

## 2012-09-25 ENCOUNTER — Encounter (HOSPITAL_COMMUNITY): Payer: Self-pay

## 2012-09-27 ENCOUNTER — Encounter (HOSPITAL_COMMUNITY)
Admission: RE | Admit: 2012-09-27 | Discharge: 2012-09-27 | Disposition: A | Discharge status: Home / Self Care | Payer: Self-pay | Source: Ambulatory Visit | Attending: Cardiovascular Disease | Admitting: Cardiovascular Disease

## 2012-09-30 ENCOUNTER — Encounter (HOSPITAL_COMMUNITY)
Admission: RE | Admit: 2012-09-30 | Discharge: 2012-09-30 | Disposition: A | Discharge status: Home / Self Care | Payer: Self-pay | Source: Ambulatory Visit | Attending: Cardiovascular Disease | Admitting: Cardiovascular Disease

## 2012-10-01 DIAGNOSIS — L910 Hypertrophic scar: Secondary | ICD-10-CM | POA: Diagnosis not present

## 2012-10-02 ENCOUNTER — Encounter (HOSPITAL_COMMUNITY): Payer: Self-pay

## 2012-10-04 ENCOUNTER — Encounter (HOSPITAL_COMMUNITY): Payer: Self-pay

## 2012-10-07 ENCOUNTER — Encounter (HOSPITAL_COMMUNITY)
Admission: RE | Admit: 2012-10-07 | Discharge: 2012-10-07 | Disposition: A | Discharge status: Home / Self Care | Payer: Self-pay | Source: Ambulatory Visit | Attending: Cardiovascular Disease | Admitting: Cardiovascular Disease

## 2012-10-09 ENCOUNTER — Encounter (HOSPITAL_COMMUNITY): Payer: Self-pay

## 2012-10-11 ENCOUNTER — Telehealth: Payer: Self-pay | Admitting: *Deleted

## 2012-10-11 ENCOUNTER — Encounter (HOSPITAL_COMMUNITY): Payer: Self-pay

## 2012-10-11 MED ORDER — TELMISARTAN 80 MG PO TABS: 80.0000 mg | ORAL_TABLET | Freq: Every day | ORAL | Status: DC

## 2012-10-11 NOTE — Telephone Encounter (Signed)
Received call from pt stating Micardis HCT has gone up to $150 and he is unable to afford medication. Currently out. Spoke with pharmacist, he states pt has a deductible to meet and that is the reason for the increased cost. Was told that generic Micardis 80mg  will cost pt $89.73 and HCTZ will cost $3.29. Spoke with pt, he prefers to try generic micardis and hctz. Advised pt of [email or web address] website to try micardis coupon. Rx sent.

## 2012-10-14 ENCOUNTER — Encounter (HOSPITAL_COMMUNITY): Payer: Self-pay

## 2012-10-14 ENCOUNTER — Other Ambulatory Visit: Payer: Self-pay | Admitting: Family

## 2012-10-16 ENCOUNTER — Encounter (HOSPITAL_COMMUNITY): Payer: Self-pay

## 2012-10-18 ENCOUNTER — Encounter (HOSPITAL_COMMUNITY): Payer: Self-pay

## 2012-10-21 ENCOUNTER — Encounter (HOSPITAL_COMMUNITY): Payer: Medicare Other | Attending: Cardiovascular Disease

## 2012-10-21 DIAGNOSIS — I712 Thoracic aortic aneurysm, without rupture, unspecified: Secondary | ICD-10-CM | POA: Insufficient documentation

## 2012-10-21 DIAGNOSIS — I509 Heart failure, unspecified: Secondary | ICD-10-CM | POA: Insufficient documentation

## 2012-10-21 DIAGNOSIS — K219 Gastro-esophageal reflux disease without esophagitis: Secondary | ICD-10-CM | POA: Insufficient documentation

## 2012-10-21 DIAGNOSIS — I2510 Atherosclerotic heart disease of native coronary artery without angina pectoris: Secondary | ICD-10-CM | POA: Insufficient documentation

## 2012-10-21 DIAGNOSIS — I359 Nonrheumatic aortic valve disorder, unspecified: Secondary | ICD-10-CM | POA: Insufficient documentation

## 2012-10-21 DIAGNOSIS — Z951 Presence of aortocoronary bypass graft: Secondary | ICD-10-CM | POA: Insufficient documentation

## 2012-10-21 DIAGNOSIS — I10 Essential (primary) hypertension: Secondary | ICD-10-CM | POA: Insufficient documentation

## 2012-10-21 DIAGNOSIS — I4892 Unspecified atrial flutter: Secondary | ICD-10-CM | POA: Insufficient documentation

## 2012-10-21 DIAGNOSIS — G4730 Sleep apnea, unspecified: Secondary | ICD-10-CM | POA: Insufficient documentation

## 2012-10-21 DIAGNOSIS — Z5189 Encounter for other specified aftercare: Secondary | ICD-10-CM | POA: Insufficient documentation

## 2012-10-21 DIAGNOSIS — I428 Other cardiomyopathies: Secondary | ICD-10-CM | POA: Insufficient documentation

## 2012-10-21 DIAGNOSIS — Z954 Presence of other heart-valve replacement: Secondary | ICD-10-CM | POA: Insufficient documentation

## 2012-10-23 ENCOUNTER — Encounter (HOSPITAL_COMMUNITY): Payer: Medicare Other

## 2012-10-25 ENCOUNTER — Encounter (HOSPITAL_COMMUNITY): Payer: Medicare Other

## 2012-10-28 ENCOUNTER — Encounter (HOSPITAL_COMMUNITY): Payer: Medicare Other

## 2012-10-29 DIAGNOSIS — L910 Hypertrophic scar: Secondary | ICD-10-CM | POA: Diagnosis not present

## 2012-10-30 ENCOUNTER — Encounter (HOSPITAL_COMMUNITY): Payer: Medicare Other

## 2012-11-01 ENCOUNTER — Encounter (HOSPITAL_COMMUNITY): Payer: Medicare Other

## 2012-11-04 ENCOUNTER — Encounter (HOSPITAL_COMMUNITY): Payer: Medicare Other

## 2012-11-06 ENCOUNTER — Encounter (HOSPITAL_COMMUNITY): Payer: Medicare Other

## 2012-11-08 ENCOUNTER — Encounter (HOSPITAL_COMMUNITY): Payer: Medicare Other

## 2012-11-11 ENCOUNTER — Encounter (HOSPITAL_COMMUNITY): Payer: Medicare Other

## 2012-11-13 ENCOUNTER — Encounter (HOSPITAL_COMMUNITY): Payer: Medicare Other

## 2012-11-14 DIAGNOSIS — B351 Tinea unguium: Secondary | ICD-10-CM | POA: Diagnosis not present

## 2012-11-14 DIAGNOSIS — M79609 Pain in unspecified limb: Secondary | ICD-10-CM | POA: Diagnosis not present

## 2012-11-15 ENCOUNTER — Encounter (HOSPITAL_COMMUNITY): Payer: Medicare Other

## 2012-11-18 ENCOUNTER — Encounter (HOSPITAL_COMMUNITY): Payer: Medicare Other

## 2012-11-20 ENCOUNTER — Encounter (HOSPITAL_COMMUNITY): Payer: Self-pay | Attending: Cardiovascular Disease

## 2012-11-20 DIAGNOSIS — I10 Essential (primary) hypertension: Secondary | ICD-10-CM | POA: Insufficient documentation

## 2012-11-20 DIAGNOSIS — I509 Heart failure, unspecified: Secondary | ICD-10-CM | POA: Insufficient documentation

## 2012-11-20 DIAGNOSIS — G4730 Sleep apnea, unspecified: Secondary | ICD-10-CM | POA: Insufficient documentation

## 2012-11-20 DIAGNOSIS — I712 Thoracic aortic aneurysm, without rupture, unspecified: Secondary | ICD-10-CM | POA: Insufficient documentation

## 2012-11-20 DIAGNOSIS — I2510 Atherosclerotic heart disease of native coronary artery without angina pectoris: Secondary | ICD-10-CM | POA: Insufficient documentation

## 2012-11-20 DIAGNOSIS — I428 Other cardiomyopathies: Secondary | ICD-10-CM | POA: Insufficient documentation

## 2012-11-20 DIAGNOSIS — I4892 Unspecified atrial flutter: Secondary | ICD-10-CM | POA: Insufficient documentation

## 2012-11-20 DIAGNOSIS — Z954 Presence of other heart-valve replacement: Secondary | ICD-10-CM | POA: Insufficient documentation

## 2012-11-20 DIAGNOSIS — Z5189 Encounter for other specified aftercare: Secondary | ICD-10-CM | POA: Insufficient documentation

## 2012-11-20 DIAGNOSIS — K219 Gastro-esophageal reflux disease without esophagitis: Secondary | ICD-10-CM | POA: Insufficient documentation

## 2012-11-20 DIAGNOSIS — Z951 Presence of aortocoronary bypass graft: Secondary | ICD-10-CM | POA: Insufficient documentation

## 2012-11-20 DIAGNOSIS — I359 Nonrheumatic aortic valve disorder, unspecified: Secondary | ICD-10-CM | POA: Insufficient documentation

## 2012-11-21 ENCOUNTER — Telehealth: Payer: Self-pay | Admitting: Internal Medicine

## 2012-11-21 ENCOUNTER — Other Ambulatory Visit: Payer: Self-pay | Admitting: *Deleted

## 2012-11-21 DIAGNOSIS — I509 Heart failure, unspecified: Secondary | ICD-10-CM

## 2012-11-21 DIAGNOSIS — I712 Thoracic aortic aneurysm, without rupture: Secondary | ICD-10-CM

## 2012-11-21 DIAGNOSIS — I10 Essential (primary) hypertension: Secondary | ICD-10-CM

## 2012-11-21 DIAGNOSIS — E876 Hypokalemia: Secondary | ICD-10-CM

## 2012-11-21 MED ORDER — POTASSIUM CHLORIDE CRYS ER 20 MEQ PO TBCR: 40.0000 meq | EXTENDED_RELEASE_TABLET | Freq: Three times a day (TID) | ORAL | Status: DC

## 2012-11-21 MED ORDER — FUROSEMIDE 40 MG PO TABS: 40.0000 mg | ORAL_TABLET | Freq: Two times a day (BID) | ORAL | Status: DC

## 2012-11-21 MED ORDER — HYDROCHLOROTHIAZIDE 25 MG PO TABS: 25.0000 mg | ORAL_TABLET | Freq: Every day | ORAL | Status: DC

## 2012-11-21 MED ORDER — CARVEDILOL 25 MG PO TABS: 25.0000 mg | ORAL_TABLET | Freq: Two times a day (BID) | ORAL | Status: DC

## 2012-11-21 MED ORDER — AMLODIPINE BESYLATE 10 MG PO TABS: 10.0000 mg | ORAL_TABLET | Freq: Every day | ORAL | Status: DC

## 2012-11-21 MED ORDER — OMEPRAZOLE 40 MG PO CPDR: 40.0000 mg | DELAYED_RELEASE_CAPSULE | Freq: Every day | ORAL | Status: DC

## 2012-11-21 NOTE — Telephone Encounter (Signed)
Omeprazole 40 mg take 1 per day  Qty 90  hydrochlorizide 25 mg take 2 per day qty 180   [facility]  [phone]  Member number [id number]

## 2012-11-21 NOTE — Telephone Encounter (Signed)
Fax Received. Refill Completed. [place] Gayden Chowoe (R.M.A)   

## 2012-11-22 ENCOUNTER — Encounter (HOSPITAL_COMMUNITY): Payer: Self-pay

## 2012-11-25 ENCOUNTER — Encounter (HOSPITAL_COMMUNITY): Payer: Self-pay

## 2012-11-27 ENCOUNTER — Encounter (HOSPITAL_COMMUNITY): Payer: Self-pay

## 2012-11-29 ENCOUNTER — Encounter (HOSPITAL_COMMUNITY): Payer: Self-pay

## 2012-12-02 ENCOUNTER — Encounter (HOSPITAL_COMMUNITY): Payer: Self-pay

## 2012-12-04 ENCOUNTER — Encounter (HOSPITAL_COMMUNITY): Payer: Self-pay

## 2012-12-06 ENCOUNTER — Encounter (HOSPITAL_COMMUNITY): Payer: Self-pay

## 2012-12-09 ENCOUNTER — Encounter (HOSPITAL_COMMUNITY): Payer: Self-pay

## 2012-12-11 ENCOUNTER — Encounter (HOSPITAL_COMMUNITY): Payer: Self-pay

## 2012-12-13 ENCOUNTER — Encounter (HOSPITAL_COMMUNITY): Payer: Self-pay

## 2012-12-16 ENCOUNTER — Encounter (HOSPITAL_COMMUNITY): Payer: Self-pay

## 2012-12-18 ENCOUNTER — Encounter (HOSPITAL_COMMUNITY): Payer: Self-pay

## 2012-12-18 ENCOUNTER — Telehealth: Payer: Self-pay | Admitting: Internal Medicine

## 2012-12-18 NOTE — Telephone Encounter (Signed)
Patients wife called in stating that patients medication Klor-con had accidentally been put in the refrigerator. She wants to know if this medication is still okay to use?

## 2012-12-18 NOTE — Telephone Encounter (Signed)
Yes the KCL is still good

## 2012-12-18 NOTE — Telephone Encounter (Signed)
Please advise 

## 2012-12-19 NOTE — Telephone Encounter (Signed)
Patient wife called back  Advised her Dr B said the meds are still good

## 2012-12-20 ENCOUNTER — Encounter (HOSPITAL_COMMUNITY): Payer: Medicare Other

## 2012-12-20 DIAGNOSIS — I712 Thoracic aortic aneurysm, without rupture, unspecified: Secondary | ICD-10-CM | POA: Insufficient documentation

## 2012-12-20 DIAGNOSIS — K219 Gastro-esophageal reflux disease without esophagitis: Secondary | ICD-10-CM | POA: Insufficient documentation

## 2012-12-20 DIAGNOSIS — I509 Heart failure, unspecified: Secondary | ICD-10-CM | POA: Insufficient documentation

## 2012-12-20 DIAGNOSIS — I2510 Atherosclerotic heart disease of native coronary artery without angina pectoris: Secondary | ICD-10-CM | POA: Insufficient documentation

## 2012-12-20 DIAGNOSIS — G4730 Sleep apnea, unspecified: Secondary | ICD-10-CM | POA: Insufficient documentation

## 2012-12-20 DIAGNOSIS — I10 Essential (primary) hypertension: Secondary | ICD-10-CM | POA: Insufficient documentation

## 2012-12-20 DIAGNOSIS — I428 Other cardiomyopathies: Secondary | ICD-10-CM | POA: Insufficient documentation

## 2012-12-20 DIAGNOSIS — I359 Nonrheumatic aortic valve disorder, unspecified: Secondary | ICD-10-CM | POA: Insufficient documentation

## 2012-12-20 DIAGNOSIS — Z5189 Encounter for other specified aftercare: Secondary | ICD-10-CM | POA: Insufficient documentation

## 2012-12-20 DIAGNOSIS — Z954 Presence of other heart-valve replacement: Secondary | ICD-10-CM | POA: Insufficient documentation

## 2012-12-20 DIAGNOSIS — Z951 Presence of aortocoronary bypass graft: Secondary | ICD-10-CM | POA: Insufficient documentation

## 2012-12-20 DIAGNOSIS — I4892 Unspecified atrial flutter: Secondary | ICD-10-CM | POA: Insufficient documentation

## 2012-12-23 ENCOUNTER — Encounter (HOSPITAL_COMMUNITY): Payer: Medicare Other

## 2012-12-25 ENCOUNTER — Encounter (HOSPITAL_COMMUNITY): Payer: Medicare Other

## 2012-12-27 ENCOUNTER — Encounter (HOSPITAL_COMMUNITY): Payer: Medicare Other

## 2012-12-30 ENCOUNTER — Encounter (HOSPITAL_COMMUNITY)
Admission: RE | Admit: 2012-12-30 | Discharge: 2012-12-30 | Disposition: A | Discharge status: Home / Self Care | Payer: Self-pay | Source: Ambulatory Visit | Attending: Cardiovascular Disease | Admitting: Cardiovascular Disease

## 2012-12-31 ENCOUNTER — Ambulatory Visit: Payer: Medicare Other | Admitting: Family Medicine

## 2013-01-01 ENCOUNTER — Encounter (HOSPITAL_COMMUNITY): Payer: Medicare Other

## 2013-01-03 ENCOUNTER — Encounter (HOSPITAL_COMMUNITY): Payer: Medicare Other

## 2013-01-06 ENCOUNTER — Encounter (HOSPITAL_COMMUNITY): Payer: Medicare Other

## 2013-01-08 ENCOUNTER — Encounter (HOSPITAL_COMMUNITY): Payer: Medicare Other

## 2013-01-10 ENCOUNTER — Encounter (HOSPITAL_COMMUNITY): Payer: Medicare Other

## 2013-01-14 ENCOUNTER — Ambulatory Visit (INDEPENDENT_AMBULATORY_CARE_PROVIDER_SITE_OTHER): Payer: Medicare Other | Admitting: Family Medicine

## 2013-01-14 ENCOUNTER — Encounter: Payer: Self-pay | Admitting: Family Medicine

## 2013-01-14 VITALS — BP 148/76 | HR 76 | Temp 97.9°F | Ht 73.0 in | Wt 328.1 lb

## 2013-01-14 DIAGNOSIS — E785 Hyperlipidemia, unspecified: Secondary | ICD-10-CM

## 2013-01-14 DIAGNOSIS — I25810 Atherosclerosis of coronary artery bypass graft(s) without angina pectoris: Secondary | ICD-10-CM | POA: Diagnosis not present

## 2013-01-14 DIAGNOSIS — I509 Heart failure, unspecified: Secondary | ICD-10-CM

## 2013-01-14 DIAGNOSIS — I10 Essential (primary) hypertension: Secondary | ICD-10-CM | POA: Diagnosis not present

## 2013-01-14 DIAGNOSIS — R7309 Other abnormal glucose: Secondary | ICD-10-CM

## 2013-01-14 DIAGNOSIS — R739 Hyperglycemia, unspecified: Secondary | ICD-10-CM

## 2013-01-14 HISTORY — DX: Hyperlipidemia, unspecified: E78.5

## 2013-01-14 LAB — CBC
HCT: 39.9 % (ref 39.0–52.0)
Hemoglobin: 14 g/dL (ref 13.0–17.0)
MCV: 84 fL (ref 78.0–100.0)
RBC: 4.75 MIL/uL (ref 4.22–5.81)
WBC: 7.4 10*3/uL (ref 4.0–10.5)

## 2013-01-14 LAB — LIPID PANEL
LDL Cholesterol: 95 mg/dL (ref 0–99)
Triglycerides: 124 mg/dL (ref <150)

## 2013-01-14 LAB — RENAL FUNCTION PANEL
CO2: 31 mEq/L (ref 19–32)
Chloride: 102 mEq/L (ref 96–112)
Glucose, Bld: 102 mg/dL — ABNORMAL HIGH (ref 70–99)
Sodium: 141 mEq/L (ref 135–145)

## 2013-01-14 LAB — HEPATIC FUNCTION PANEL
AST: 14 U/L (ref 0–37)
Alkaline Phosphatase: 75 U/L (ref 39–117)
Bilirubin, Direct: 0.2 mg/dL (ref 0.0–0.3)
Total Bilirubin: 0.9 mg/dL (ref 0.3–1.2)

## 2013-01-14 NOTE — Assessment & Plan Note (Signed)
Mild depression of hdl cholesterol, avoid trans fats and start a krill oil supplement

## 2013-01-14 NOTE — Assessment & Plan Note (Signed)
hgba1c 5.2, minimize simple carbs, consider DASH diet. Increase activity as tolerated. Is particating in cardiac rehab.

## 2013-01-14 NOTE — Progress Notes (Signed)
Patient ID: David Parsons, male   DOB: 1940-12-10, 72 y.o.   MRN: [id number] David Parsons [id number] [birth year] 01/14/2013      Progress Note-Follow Up  Subjective  Chief Complaint  Chief Complaint  Patient presents with  . Follow-up    4 month    HPI  Patient is a 72 year old African American male who is in today for routine followup. He is participating in active cardiac rehabilitation and finds that helpful. Overall he feels she's doing very well status post cardiac bypass grafting and aortic valve replacement. Denies recent chest pain or palpitations. Has dyspnea exertion but this is improving. No complaints of edema or recent illness. Denies polyuria and polydipsia. Is taking medications as prescribed. Is complaining of his keloid scar on his anterior chest wall. Notes it is significantly pruritic and is following with dermatology   Past Medical History  Diagnosis Date  . Hypertension   . Aortic insufficiency      02/16/09 Chest CT:  The aortic root has a diameter measuring 4.6 cm, image 64 of the coronal series.  The ascending    thoracic aorta has a diameter of 3.8 cm, image 65 of the coronal series.  At the level of the aortic arch    the thoracic aorta has a maximum diameter of 3.2 cm.  The descending thoracic aorta has a maximum           diameter of 3.2 cm.  There is no evidence for aortic disse        . GERD (gastroesophageal reflux disease)   . Hypokalemia   . Helicobacter pylori gastritis     (Tx Pylera 4/10)  . Cardiomyopathy      01/2009 - Cath - nonobs. dzs.  EF 35%.  . Gallstones     s/p cholecystectomy    . Sleep apnea     occasionally wears CPAP at night  . CAD (coronary artery disease)   . Aortic root enlargement     The aortic root has a diameter measuring 4.6 cm, image 64 of the coronal series.  The ascending  thoracic aorta has a diameter of 3.8 cm, image 65 of the coronal series.  At the level of the aortic arch   the thoracic aorta has a maximum diameter of  3.2 cm.  The descending thoracic aorta has a maximum   diameter of 3.2 cm.  There is no evidence for aortic dissection.       . Iron deficiency anemia   . CHF (congestive heart failure)   . Obesity, Class III, BMI 40-49.9 (morbid obesity)   . Heart murmur   . PONV (postoperative nausea and vomiting)   . 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 Diabetes mellitus   . Dyslipidemia 01/14/2013    Past Surgical History  Procedure Laterality Date  . Cholecystectomy    . Adrenalectomy    . Colonoscopy w/ biopsies and polypectomy  11/04/2008    colon polyps, diverticulosis   . Upper gastrointestinal endoscopy  11/04/2008    w/bx, H pylori gastritis  . Upper gastrointestinal endoscopy  09/25/2011  . Colonoscopy  09/25/2011  . Multiple tooth extractions    . Tee without cardioversion  11/22/2011    Procedure: TRANSESOPHAGEAL ECHOCARDIOGRAM (  TEE);  Surgeon: Laurey Morale, MD;  Location: [facility] ENDOSCOPY;  Service: Cardiovascular;  Laterality: N/A;  . Cardiac catheterization      2013  . Cardiac valve replacement  12/28/2011    Bentall with tissue valve  . Coronary artery bypass graft  12/28/2011    Procedure: CORONARY ARTERY BYPASS GRAFTING (CABG);  Surgeon: Purcell Nails, MD;  Location: [facility] OR;  Service: Open Heart Surgery;  Laterality: N/A;  cabg x 1  . Bentall procedure  12/28/2011    Procedure: BENTALL PROCEDURE;  Surgeon: Purcell Nails, MD;  Location: [facility] OR;  Service: Open Heart Surgery;  Laterality: N/A;    Family History  Problem Relation Age of Onset  . Breast cancer Sister   . Colon cancer Brother 70    at least 70  . Prostate cancer Brother   . Diabetes Sister     5 siblings  . Stomach cancer Sister   . Hypertension Mother   . Kidney  failure Sister   . Colon cancer Sister   . Breast cancer Sister   . Stroke Father     History   Social History  . Marital Status: Married    Spouse Name: Steward Drone    Number of Children: 0  . Years of Education: N/A   Occupational History  .      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 Smoking status: Never Smoker   . Smokeless tobacco: Never Used  . Alcohol Use: No  . Drug Use: No  . Sexually Active: Not on file   Other Topics Concern  . Not on file   Social History Narrative   The patient is married, lives with his wife in  [place].  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  He  lives a very sedentary lifestyle.  He is a nonsmoker.  He denies alcohol consumption.     Current Outpatient Prescriptions on File Prior to Visit  Medication Sig Dispense Refill  . amLODipine (NORVASC) 10 MG tablet Take 1 tablet (10 mg total) by mouth daily.  90 tablet  1  . aspirin EC 81 MG tablet Take 81 mg by mouth daily.      . carvedilol (COREG) 25 MG tablet Take 1 tablet (25 mg total) by mouth 2 (two) times daily with a meal.  180 tablet  1  . ferrous sulfate 325 (65 FE) MG tablet Take 325 mg by mouth 2 (two) times daily before a meal.      . furosemide (LASIX) 40 MG tablet Take 1 tablet (40 mg total) by mouth 2 (two) times daily.  180 tablet  1  . hydrochlorothiazide (HYDRODIURIL) 25 MG tablet Take 1 tablet (25 mg total) by mouth daily.  90 tablet  0  . Multiple Vitamin (MULITIVITAMIN WITH MINERALS) TABS Take 1 tablet by mouth daily.      Marland Kitchen omeprazole (PRILOSEC) 20 MG capsule Take 40 mg by mouth daily.       Marland Kitchen omeprazole (PRILOSEC) 40 MG capsule Take 1 capsule (40 mg total) by mouth daily.  90 capsule  0  . potassium chloride SA (K-DUR,KLOR-CON) 20 MEQ tablet Take 2 tablets (40 mEq total) by mouth 3 (three) times daily.  540 tablet  1  . telmisartan (MICARDIS) 80 MG tablet Take 1 tablet (80 mg total) by mouth daily.  30 tablet  2   No current  facility-administered medications on file prior to visit.  Allergies  Allergen Reactions  . Latex Hives    Latex gloves     Review of Systems  Review of Systems  Constitutional: Positive for malaise/fatigue. Negative for fever.  HENT: Negative for congestion.   Eyes: Negative for discharge.  Respiratory: Negative for shortness of breath.   Cardiovascular: Negative for chest pain, palpitations and leg swelling.  Gastrointestinal: Negative for nausea, abdominal pain and diarrhea.  Genitourinary: Negative for dysuria.  Musculoskeletal: Negative for falls.  Skin: Positive for itching. Negative for rash.       Keloid on chest very pruritic, is following with dermatology with steroid injections to try and minimize keloids  Neurological: Negative for loss of consciousness and headaches.  Endo/Heme/Allergies: Negative for polydipsia.  Psychiatric/Behavioral: Negative for depression and suicidal ideas. The patient is not nervous/anxious and does not have insomnia.     Objective  BP 148/76  Pulse 76  Temp(Src) 97.9 F (36.6 C) (Oral)  Ht 6\' 1"  (1.854 m)  Wt 328 lb 1.9 oz (148.834 kg)  BMI 43.3 kg/m2  SpO2 96%  Physical Exam  Physical Exam  Constitutional: He is oriented to person, place, and time and well-developed, well-nourished, and in no distress. No distress.  HENT:  Head: Normocephalic and atraumatic.  Eyes: Conjunctivae are normal.  Neck: Neck supple. No thyromegaly present.  Cardiovascular: Normal rate and regular rhythm.   Murmur heard. Pulmonary/Chest: Effort normal and breath sounds normal. No respiratory distress.  Abdominal: He exhibits no distension and no mass. There is no tenderness.  Musculoskeletal: He exhibits no edema.  Neurological: He is alert and oriented to person, place, and time.  Skin: Skin is warm.  Vertical scar very hypertrophic along sternum  Psychiatric: Memory, affect and judgment normal.    Lab Results  Component Value Date   TSH  1.379 01/14/2013   Lab Results  Component Value Date   WBC 7.4 01/14/2013   HGB 14.0 01/14/2013   HCT 39.9 01/14/2013   MCV 84.0 01/14/2013   PLT 286 01/14/2013   Lab Results  Component Value Date   CREATININE 0.83 01/14/2013   BUN 12 01/14/2013   NA 141 01/14/2013   K 3.3* 01/14/2013   CL 102 01/14/2013   CO2 31 01/14/2013   Lab Results  Component Value Date   ALT 11 01/14/2013   AST 14 01/14/2013   ALKPHOS 75 01/14/2013   BILITOT 0.9 01/14/2013   Lab Results  Component Value Date   CHOL 156 01/14/2013   Lab Results  Component Value Date   HDL 36* 01/14/2013   Lab Results  Component Value Date   LDLCALC 95 01/14/2013   Lab Results  Component Value Date   TRIG 124 01/14/2013   Lab Results  Component Value Date   CHOLHDL 4.3 01/14/2013     Assessment & Plan  HYPERTENSION Mild elevation, improved on recheck, encouraged DASH diet and if still elevated at next visit may need to consider increasing medications  CONGESTIVE HEART FAILURE, UNSPECIFIED Not recent flares.  Other abnormal glucose hgba1c 5.2, minimize simple carbs, consider DASH diet. Increase activity as tolerated. Is particating in cardiac rehab.  Dyslipidemia Mild depression of hdl cholesterol, avoid trans fats and start a krill oil supplement

## 2013-01-14 NOTE — Assessment & Plan Note (Signed)
Mild elevation, improved on recheck, encouraged DASH diet and if still elevated at next visit may need to consider increasing medications

## 2013-01-14 NOTE — Assessment & Plan Note (Signed)
Not recent flares.

## 2013-01-14 NOTE — Patient Instructions (Addendum)
Witch Hazel Astringent and Sarna anti itch lotion MegaRed krill oil caps daily by Schiff   Pruritus  Pruritis is an itch. There are many different problems that can cause an itch. Dry skin is one of the most common causes of itching. Most cases of itching do not require medical attention.  HOME CARE INSTRUCTIONS  Make sure your skin is moistened on a regular basis. A moisturizer that contains petroleum jelly is best for keeping moisture in your skin. If you develop a rash, you may try the following for relief:   Use corticosteroid cream.  Apply cool compresses to the affected areas.  Bathe with Epsom salts or baking soda in the bathwater.  Soak in colloidal oatmeal baths. These are available at your pharmacy.  Apply baking soda paste to the rash. Stir water into baking soda until it reaches a paste-like consistency.  Use an anti-itch lotion.  Take over-the-counter diphenhydramine medicine by mouth as the instructions direct.  Avoid scratching. Scratching may cause the rash to become infected. If itching is very bad, your caregiver may suggest prescription lotions or creams to lessen your symptoms.  Avoid hot showers, which can make itching worse. A cold shower may help with itching as long as you use a moisturizer after the shower. SEEK MEDICAL CARE IF: The itching does not go away after several days. Document Released: 04/19/2011 Document Revised: 10/30/2011 Document Reviewed: 04/19/2011 [facility] Patient Information 2014 [place], [place].   For metformin diarrhea, make sure it is an extended Metformin, if not it can be changed and have her start Benefiber powder twice and consider a probiotic such as Digestive Advantage or generic alternative

## 2013-01-15 ENCOUNTER — Encounter (HOSPITAL_COMMUNITY): Payer: Medicare Other

## 2013-01-16 ENCOUNTER — Telehealth: Payer: Self-pay | Admitting: *Deleted

## 2013-01-16 DIAGNOSIS — I712 Thoracic aortic aneurysm, without rupture: Secondary | ICD-10-CM

## 2013-01-16 DIAGNOSIS — E876 Hypokalemia: Secondary | ICD-10-CM

## 2013-01-16 MED ORDER — POTASSIUM CHLORIDE CRYS ER 20 MEQ PO TBCR: 40.0000 meq | EXTENDED_RELEASE_TABLET | Freq: Three times a day (TID) | ORAL | Status: DC

## 2013-01-16 NOTE — Telephone Encounter (Signed)
Patient notified of labs and directions. Resent rx to pharmacy because last refill was put in refrigerator and pharmacist advised patient to have new rx sent in. Patient was advised by pharmacist to discard old rx.

## 2013-01-16 NOTE — Telephone Encounter (Signed)
Message copied by Marlene Lard on Thu Jan 16, 2013 11:43 AM ------      Message from: Danise Edge A      Created: Wed Jan 15, 2013  6:29 AM       Notify labs look good except K is slightly low he already takes a lot of KCL. He needs to increase his am dose of KCL to 20 meq tob  3 tabs, recheck in 1-2 weeks ------

## 2013-01-17 ENCOUNTER — Encounter (HOSPITAL_COMMUNITY): Payer: Medicare Other

## 2013-01-20 ENCOUNTER — Encounter (HOSPITAL_COMMUNITY): Payer: Medicare Other

## 2013-01-22 ENCOUNTER — Encounter (HOSPITAL_COMMUNITY): Payer: Medicare Other

## 2013-01-24 ENCOUNTER — Telehealth: Payer: Self-pay | Admitting: *Deleted

## 2013-01-24 ENCOUNTER — Encounter (HOSPITAL_COMMUNITY): Payer: Medicare Other

## 2013-01-24 DIAGNOSIS — E876 Hypokalemia: Secondary | ICD-10-CM | POA: Diagnosis not present

## 2013-01-24 NOTE — Telephone Encounter (Signed)
Pt presented to the lab. Order entered per 01/14/13 lab note.

## 2013-01-27 ENCOUNTER — Encounter (HOSPITAL_COMMUNITY): Payer: Medicare Other

## 2013-01-29 ENCOUNTER — Encounter (HOSPITAL_COMMUNITY): Payer: Medicare Other

## 2013-01-31 ENCOUNTER — Encounter (HOSPITAL_COMMUNITY): Payer: Medicare Other

## 2013-02-01 ENCOUNTER — Other Ambulatory Visit: Payer: Self-pay | Admitting: Family

## 2013-02-03 ENCOUNTER — Encounter (HOSPITAL_COMMUNITY): Payer: Medicare Other

## 2013-02-03 NOTE — Telephone Encounter (Signed)
Please advise refill? 

## 2013-02-04 ENCOUNTER — Ambulatory Visit: Payer: Medicare Other | Admitting: Family Medicine

## 2013-02-05 ENCOUNTER — Encounter (HOSPITAL_COMMUNITY): Payer: Medicare Other

## 2013-02-07 ENCOUNTER — Encounter (HOSPITAL_COMMUNITY): Payer: Medicare Other

## 2013-02-10 ENCOUNTER — Encounter (HOSPITAL_COMMUNITY): Payer: Medicare Other

## 2013-02-11 ENCOUNTER — Other Ambulatory Visit: Payer: Self-pay | Admitting: Cardiovascular Disease

## 2013-02-12 ENCOUNTER — Encounter (HOSPITAL_COMMUNITY): Payer: Medicare Other

## 2013-02-14 ENCOUNTER — Encounter (HOSPITAL_COMMUNITY): Payer: Medicare Other

## 2013-02-17 ENCOUNTER — Encounter (HOSPITAL_COMMUNITY): Payer: Medicare Other

## 2013-02-19 ENCOUNTER — Encounter (HOSPITAL_COMMUNITY): Payer: Medicare Other

## 2013-02-19 ENCOUNTER — Other Ambulatory Visit: Payer: Self-pay | Admitting: Family Medicine

## 2013-02-19 ENCOUNTER — Other Ambulatory Visit: Payer: Self-pay | Admitting: Cardiovascular Disease

## 2013-02-24 ENCOUNTER — Encounter (HOSPITAL_COMMUNITY): Payer: Medicare Other

## 2013-02-26 ENCOUNTER — Encounter (HOSPITAL_COMMUNITY): Payer: Medicare Other

## 2013-02-28 ENCOUNTER — Encounter (HOSPITAL_COMMUNITY): Payer: Medicare Other

## 2013-03-03 ENCOUNTER — Encounter (HOSPITAL_COMMUNITY): Payer: Medicare Other

## 2013-03-05 ENCOUNTER — Encounter (HOSPITAL_COMMUNITY): Payer: Medicare Other

## 2013-03-07 ENCOUNTER — Encounter (HOSPITAL_COMMUNITY): Payer: Medicare Other

## 2013-03-10 ENCOUNTER — Encounter (HOSPITAL_COMMUNITY): Payer: Medicare Other

## 2013-03-12 ENCOUNTER — Encounter (HOSPITAL_COMMUNITY): Payer: Medicare Other

## 2013-03-12 DIAGNOSIS — M79609 Pain in unspecified limb: Secondary | ICD-10-CM | POA: Diagnosis not present

## 2013-03-12 DIAGNOSIS — B351 Tinea unguium: Secondary | ICD-10-CM | POA: Diagnosis not present

## 2013-03-14 ENCOUNTER — Encounter (HOSPITAL_COMMUNITY): Payer: Medicare Other

## 2013-03-17 ENCOUNTER — Encounter (HOSPITAL_COMMUNITY): Payer: Medicare Other

## 2013-03-19 ENCOUNTER — Encounter (HOSPITAL_COMMUNITY): Payer: Medicare Other

## 2013-03-21 ENCOUNTER — Encounter (HOSPITAL_COMMUNITY): Payer: Medicare Other

## 2013-03-24 ENCOUNTER — Encounter (HOSPITAL_COMMUNITY): Payer: Medicare Other

## 2013-03-26 ENCOUNTER — Encounter (HOSPITAL_COMMUNITY): Payer: Medicare Other

## 2013-03-26 ENCOUNTER — Other Ambulatory Visit: Payer: Self-pay

## 2013-03-28 ENCOUNTER — Encounter (HOSPITAL_COMMUNITY): Payer: Medicare Other

## 2013-03-31 ENCOUNTER — Encounter (HOSPITAL_COMMUNITY): Payer: Medicare Other

## 2013-04-02 ENCOUNTER — Encounter (HOSPITAL_COMMUNITY): Payer: Medicare Other

## 2013-04-04 ENCOUNTER — Encounter (HOSPITAL_COMMUNITY): Payer: Medicare Other

## 2013-04-07 ENCOUNTER — Encounter (HOSPITAL_COMMUNITY): Payer: Medicare Other

## 2013-04-09 ENCOUNTER — Encounter (HOSPITAL_COMMUNITY): Payer: Medicare Other

## 2013-04-11 ENCOUNTER — Encounter (HOSPITAL_COMMUNITY): Payer: Medicare Other

## 2013-04-14 ENCOUNTER — Encounter (HOSPITAL_COMMUNITY): Payer: Medicare Other

## 2013-04-16 ENCOUNTER — Encounter (HOSPITAL_COMMUNITY): Payer: Medicare Other

## 2013-04-18 ENCOUNTER — Encounter (HOSPITAL_COMMUNITY): Payer: Medicare Other

## 2013-04-23 ENCOUNTER — Encounter (HOSPITAL_COMMUNITY): Payer: Medicare Other

## 2013-04-23 ENCOUNTER — Telehealth: Payer: Self-pay | Admitting: *Deleted

## 2013-04-23 MED ORDER — CARVEDILOL 25 MG PO TABS: ORAL_TABLET | ORAL | Status: DC

## 2013-04-23 NOTE — Telephone Encounter (Signed)
Received message from pt stating he had to leave for [facility] suddenly and forgot to take his carvedilol with him. Requests enough to get him through to Monday when he will be back home. Supply sent. Advised pt he may need to pay out of pocket if insurance says it is too soon to refill or he can contact his ins. Company for an exception if needed. Pt voices understanding.

## 2013-04-25 ENCOUNTER — Encounter (HOSPITAL_COMMUNITY): Payer: Medicare Other

## 2013-04-28 ENCOUNTER — Encounter (HOSPITAL_COMMUNITY): Payer: Medicare Other

## 2013-04-30 ENCOUNTER — Encounter (HOSPITAL_COMMUNITY): Payer: Medicare Other

## 2013-05-01 ENCOUNTER — Other Ambulatory Visit: Payer: Self-pay | Admitting: Family

## 2013-05-02 ENCOUNTER — Encounter (HOSPITAL_COMMUNITY): Payer: Medicare Other

## 2013-05-05 ENCOUNTER — Encounter (HOSPITAL_COMMUNITY): Payer: Medicare Other

## 2013-05-07 ENCOUNTER — Encounter (HOSPITAL_COMMUNITY): Payer: Medicare Other

## 2013-05-09 ENCOUNTER — Encounter: Payer: Self-pay | Admitting: Family

## 2013-05-09 ENCOUNTER — Encounter (HOSPITAL_COMMUNITY): Payer: Medicare Other

## 2013-05-09 ENCOUNTER — Ambulatory Visit (INDEPENDENT_AMBULATORY_CARE_PROVIDER_SITE_OTHER): Payer: Medicare Other | Admitting: Family

## 2013-05-09 VITALS — BP 156/90 | HR 74 | Temp 98.0°F | Resp 18 | Ht 73.0 in | Wt 337.0 lb

## 2013-05-09 DIAGNOSIS — E876 Hypokalemia: Secondary | ICD-10-CM

## 2013-05-09 DIAGNOSIS — I2510 Atherosclerotic heart disease of native coronary artery without angina pectoris: Secondary | ICD-10-CM | POA: Diagnosis not present

## 2013-05-09 DIAGNOSIS — R7309 Other abnormal glucose: Secondary | ICD-10-CM | POA: Diagnosis not present

## 2013-05-09 DIAGNOSIS — Z23 Encounter for immunization: Secondary | ICD-10-CM | POA: Diagnosis not present

## 2013-05-09 DIAGNOSIS — R739 Hyperglycemia, unspecified: Secondary | ICD-10-CM

## 2013-05-09 DIAGNOSIS — I10 Essential (primary) hypertension: Secondary | ICD-10-CM

## 2013-05-09 DIAGNOSIS — I509 Heart failure, unspecified: Secondary | ICD-10-CM

## 2013-05-09 LAB — HEMOGLOBIN A1C
Hgb A1c MFr Bld: 5.5 % (ref <5.7)
Mean Plasma Glucose: 111 mg/dL (ref <117)

## 2013-05-09 LAB — BASIC METABOLIC PANEL
CO2: 29 mEq/L (ref 19–32)
Calcium: 9.4 mg/dL (ref 8.4–10.5)
Sodium: 140 mEq/L (ref 135–145)

## 2013-05-09 NOTE — Assessment & Plan Note (Addendum)
Clinically compensated.  Continue lasix.

## 2013-05-09 NOTE — Assessment & Plan Note (Signed)
BP up today. Resume micardis. Continue other meds.  Obtain bmet.

## 2013-05-09 NOTE — Assessment & Plan Note (Signed)
Management per cardiology.  Has follow up with Dr. Sanjuana Kava. Continue beta blocker and aspirin.

## 2013-05-09 NOTE — Patient Instructions (Addendum)
Please resume micardis. Keep upcoming follow up appointment with Dr. Sanjuana Kava. Complete lab work prior to leaving.   Follow up in 3 months.

## 2013-05-09 NOTE — Progress Notes (Signed)
Subjective:    Patient ID: David Parsons, male    DOB: [birth year], 72 y.o.   MRN: [id number]  HPI  [name] is a 72 yr old male who presents today for follow up of multiple medical problems.  1) HTN- currently maintained on amlodipine, carvedilol, lasix, hctz, and micardis- has been out of micardis for 2 weeks.   2) Hyperglycemia- Currently diet controlled.  Last A1C back in May of this year was 5.2.   3) Hypokalemia- His is on a potassium supplement.  Follow up potassium is normal.  4) Diastolic CHF (hx of aortic insufficiency with aortic valve replacement)- Denies LE edema or shortness of breath.  His weight is up 9 pounds.    5) CAD- follows with Dr. Sanjuana Kava and is scheduled for follow up on 9/30.    Review of Systems See HPI  Past Medical History  Diagnosis Date  . Hypertension   . Aortic insufficiency      02/16/09 Chest CT:  The aortic root has a diameter measuring 4.6 cm, image 64 of the coronal series.  The ascending    thoracic aorta has a diameter of 3.8 cm, image 65 of the coronal series.  At the level of the aortic arch    the thoracic aorta has a maximum diameter of 3.2 cm.  The descending thoracic aorta has a maximum           diameter of 3.2 cm.  There is no evidence for aortic disse        . GERD (gastroesophageal reflux disease)   . Hypokalemia   . Helicobacter pylori gastritis     (Tx Pylera 4/10)  . Cardiomyopathy      01/2009 - Cath - nonobs. dzs.  EF 35%.  . Gallstones     s/p cholecystectomy    . Sleep apnea     occasionally wears CPAP at night  . CAD (coronary artery disease)   . Aortic root enlargement     The aortic root has a diameter measuring 4.6 cm, image 64 of the coronal series.  The ascending  thoracic aorta has a diameter of 3.8 cm, image 65 of the coronal series.  At the level of the aortic arch   the thoracic aorta has a maximum diameter of 3.2 cm.  The descending thoracic aorta has a maximum   diameter of 3.2 cm.  There is no evidence for  aortic dissection.       . Iron deficiency anemia   . CHF (congestive heart failure)   . Obesity, Class III, BMI 40-49.9 (morbid obesity)   . Heart murmur   . PONV (postoperative nausea and vomiting)   . 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 Diabetes mellitus   . Dyslipidemia 01/14/2013    History   Social History  . Marital Status: Married    Spouse Name: David Parsons    Number of Children: 0  . Years of Education: N/A   Occupational History  .      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 Smoking status: Never Smoker   . Smokeless tobacco: Never Used  . Alcohol Use: No  . Drug Use: No  . Sexual Activity:  Not on file   Other Topics Concern  . Not on file   Social History Narrative   The patient is married, lives with his wife in  [place].  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  He  lives a very sedentary lifestyle.  He is a nonsmoker.  He denies alcohol consumption.     Past Surgical History  Procedure Laterality Date  . Cholecystectomy    . Adrenalectomy    . Colonoscopy w/ biopsies and polypectomy  11/04/2008    colon polyps, diverticulosis   . Upper gastrointestinal endoscopy  11/04/2008    w/bx, H pylori gastritis  . Upper gastrointestinal endoscopy  09/25/2011  . Colonoscopy  09/25/2011  . Multiple tooth extractions    . Tee without cardioversion  11/22/2011    Procedure: TRANSESOPHAGEAL ECHOCARDIOGRAM (TEE);  Surgeon: Laurey Morale, MD;  Location: [facility] ENDOSCOPY;  Service: Cardiovascular;  Laterality: N/A;  . Cardiac catheterization      2013  . Cardiac valve replacement  12/28/2011    Bentall with tissue valve  . Coronary artery bypass graft  12/28/2011    Procedure: CORONARY  ARTERY BYPASS GRAFTING (CABG);  Surgeon: Purcell Nails, MD;  Location: [facility] OR;  Service: Open Heart Surgery;  Laterality: N/A;  cabg x 1  . Bentall procedure  12/28/2011    Procedure: BENTALL PROCEDURE;  Surgeon: Purcell Nails, MD;  Location: [facility] OR;  Service: Open Heart Surgery;  Laterality: N/A;    Family History  Problem Relation Age of Onset  . Breast cancer Sister   . Colon cancer Brother 70    at least 70  . Prostate cancer Brother   . Diabetes Sister     5 siblings  . Stomach cancer Sister   . Hypertension Mother   . Kidney failure Sister   . Colon cancer Sister   . Breast cancer Sister   . Stroke Father     Allergies  Allergen Reactions  . Latex Hives    Latex gloves     Current Outpatient Prescriptions on File Prior to Visit  Medication Sig Dispense Refill  . amLODipine (NORVASC) 10 MG tablet Take 1 tablet by mouth   daily  90 tablet  1  . aspirin EC 81 MG tablet Take 81 mg by mouth daily.      . carvedilol (COREG) 25 MG tablet Take 1 tablet by mouth  twice a day with a meal  10 tablet  0  . ferrous sulfate 325 (65 FE) MG tablet Take 325 mg by mouth 2 (two) times daily before a meal.      . furosemide (LASIX) 40 MG tablet Take 1 tablet by mouth  twice a day  180 tablet  1  . hydrochlorothiazide (HYDRODIURIL) 25 MG tablet Take 1 tablet by mouth  daily.  90 tablet  1  . LASIX 40 MG tablet Take 1 tablet by mouth  twice a day  180 tablet  0  . Multiple Vitamin (MULITIVITAMIN WITH MINERALS) TABS Take 1 tablet by mouth daily.      Marland Kitchen omeprazole (PRILOSEC) 40 MG capsule Take 1 capsule by mouth  daily.  90 capsule  0  . telmisartan (MICARDIS) 80 MG tablet take 1 tablet by mouth once daily  30 tablet  3   No current facility-administered medications on file prior to visit.    BP 156/90  Pulse 74  Temp(Src) 98 F (36.7 C) (Oral)  Resp 18  Ht 6'  1" (1.854 m)  Wt 337 lb (152.862 kg)  BMI 44.47 kg/m2  SpO2 98%       Objective:   Physical Exam  Constitutional: He  appears well-developed.  Morbidly obese AA male in NAD.  HENT:  Head: Normocephalic and atraumatic.  Cardiovascular: Normal rate and regular rhythm.   No murmur heard. Pulmonary/Chest: Effort normal and breath sounds normal. No respiratory distress. He has no wheezes. He has no rales. He exhibits no tenderness.  Musculoskeletal:  1+ bilateral LE edema is noted.   Psychiatric: He has a normal mood and affect. His speech is normal and behavior is normal. Thought content normal.          Assessment & Plan:

## 2013-05-11 ENCOUNTER — Encounter: Payer: Self-pay | Admitting: Family

## 2013-05-12 ENCOUNTER — Encounter (HOSPITAL_COMMUNITY): Payer: Medicare Other

## 2013-05-13 ENCOUNTER — Ambulatory Visit: Payer: Medicare Other | Admitting: Cardiovascular Disease

## 2013-05-13 ENCOUNTER — Ambulatory Visit: Payer: Medicare Other | Admitting: Family

## 2013-05-14 ENCOUNTER — Encounter (HOSPITAL_COMMUNITY): Payer: Medicare Other

## 2013-05-16 ENCOUNTER — Encounter: Payer: Self-pay | Admitting: Family

## 2013-05-16 ENCOUNTER — Ambulatory Visit (INDEPENDENT_AMBULATORY_CARE_PROVIDER_SITE_OTHER): Payer: Medicare Other | Admitting: Family

## 2013-05-16 ENCOUNTER — Encounter (HOSPITAL_COMMUNITY): Payer: Medicare Other

## 2013-05-16 VITALS — BP 160/90 | HR 73 | Temp 98.6°F | Resp 16 | Ht 73.0 in | Wt 339.0 lb

## 2013-05-16 DIAGNOSIS — M79609 Pain in unspecified limb: Secondary | ICD-10-CM | POA: Diagnosis not present

## 2013-05-16 DIAGNOSIS — M79642 Pain in left hand: Secondary | ICD-10-CM

## 2013-05-16 DIAGNOSIS — I10 Essential (primary) hypertension: Secondary | ICD-10-CM

## 2013-05-16 LAB — URIC ACID: Uric Acid, Serum: 5.4 mg/dL (ref 4.0–7.8)

## 2013-05-16 LAB — RHEUMATOID FACTOR: Rhuematoid fact SerPl-aCnc: <10 IU/mL (ref <=14)

## 2013-05-16 NOTE — Assessment & Plan Note (Signed)
Suspect gout. Check uric acid and RA.  Symptoms seem to be improving on own, defer meds for now.

## 2013-05-16 NOTE — Progress Notes (Signed)
Subjective:    Patient ID: David Parsons, male    DOB: 04-18-1941, 72 y.o.   MRN: [id number]  HPI  HTN- just started micardis yesterday.  Left hand pain- reports that last weekend he developed some pain in his left hand.  Reports that over the weekend the pain was worse.  Now has improved.  Reports no known injury. Reports + soreness.    Review of Systems See HPI  Past Medical History  Diagnosis Date  . Hypertension   . Aortic insufficiency      02/16/09 Chest CT:  The aortic root has a diameter measuring 4.6 cm, image 64 of the coronal series.  The ascending    thoracic aorta has a diameter of 3.8 cm, image 65 of the coronal series.  At the level of the aortic arch    the thoracic aorta has a maximum diameter of 3.2 cm.  The descending thoracic aorta has a maximum           diameter of 3.2 cm.  There is no evidence for aortic disse        . GERD (gastroesophageal reflux disease)   . Hypokalemia   . Helicobacter pylori gastritis     (Tx Pylera 4/10)  . Cardiomyopathy      01/2009 - Cath - nonobs. dzs.  EF 35%.  . Gallstones     s/p cholecystectomy    . Sleep apnea     occasionally wears CPAP at night  . CAD (coronary artery disease)   . Aortic root enlargement     The aortic root has a diameter measuring 4.6 cm, image 64 of the coronal series.  The ascending  thoracic aorta has a diameter of 3.8 cm, image 65 of the coronal series.  At the level of the aortic arch   the thoracic aorta has a maximum diameter of 3.2 cm.  The descending thoracic aorta has a maximum   diameter of 3.2 cm.  There is no evidence for aortic dissection.       . Iron deficiency anemia   . CHF (congestive heart failure)   . Obesity, Class III, BMI 40-49.9 (morbid obesity)   . Heart murmur   . PONV (postoperative nausea and vomiting)   . 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 Diabetes mellitus   . Dyslipidemia 01/14/2013    History   Social History  . Marital Status: Married    Spouse Name: Steward Drone    Number of Children: 0  . Years of Education: N/A   Occupational History  .      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 Smoking status: Never Smoker   . Smokeless tobacco: Never Used  . Alcohol Use: No  . Drug Use: No  . Sexual Activity: Not on file   Other Topics Concern  . Not on file   Social History Narrative   The patient is married, lives with his wife in  [place].  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  He  lives a very sedentary lifestyle.  He is a nonsmoker.  He denies alcohol consumption.     Past Surgical History  Procedure Laterality Date  .  Cholecystectomy    . Adrenalectomy    . Colonoscopy w/ biopsies and polypectomy  11/04/2008    colon polyps, diverticulosis   . Upper gastrointestinal endoscopy  11/04/2008    w/bx, H pylori gastritis  . Upper gastrointestinal endoscopy  09/25/2011  . Colonoscopy  09/25/2011  . Multiple tooth extractions    . Tee without cardioversion  11/22/2011    Procedure: TRANSESOPHAGEAL ECHOCARDIOGRAM (TEE);  Surgeon: Laurey Morale, MD;  Location: [facility] ENDOSCOPY;  Service: Cardiovascular;  Laterality: N/A;  . Cardiac catheterization      2013  . Cardiac valve replacement  12/28/2011    Bentall with tissue valve  . Coronary artery bypass graft  12/28/2011    Procedure: CORONARY ARTERY BYPASS GRAFTING (CABG);  Surgeon: Purcell Nails, MD;  Location: [facility] OR;  Service: Open Heart Surgery;  Laterality: N/A;  cabg x 1  . Bentall procedure  12/28/2011    Procedure: BENTALL PROCEDURE;  Surgeon: Purcell Nails, MD;  Location: [facility] OR;  Service: Open Heart Surgery;  Laterality: N/A;    Family History  Problem Relation Age of Onset  . Breast cancer Sister   .  Colon cancer Brother 70    at least 70  . Prostate cancer Brother   . Diabetes Sister     5 siblings  . Stomach cancer Sister   . Hypertension Mother   . Kidney failure Sister   . Colon cancer Sister   . Breast cancer Sister   . Stroke Father     Allergies  Allergen Reactions  . Latex Hives    Latex gloves     Current Outpatient Prescriptions on File Prior to Visit  Medication Sig Dispense Refill  . amLODipine (NORVASC) 10 MG tablet Take 1 tablet by mouth   daily  90 tablet  1  . aspirin EC 81 MG tablet Take 81 mg by mouth daily.      . carvedilol (COREG) 25 MG tablet Take 1 tablet by mouth  twice a day with a meal  10 tablet  0  . ferrous sulfate 325 (65 FE) MG tablet Take 325 mg by mouth 2 (two) times daily before a meal.      . furosemide (LASIX) 40 MG tablet Take 1 tablet by mouth  twice a day  180 tablet  1  . hydrochlorothiazide (HYDRODIURIL) 25 MG tablet Take 1 tablet by mouth  daily.  90 tablet  1  . LASIX 40 MG tablet Take 1 tablet by mouth  twice a day  180 tablet  0  . Multiple Vitamin (MULITIVITAMIN WITH MINERALS) TABS Take 1 tablet by mouth daily.      Marland Kitchen omeprazole (PRILOSEC) 40 MG capsule Take 1 capsule by mouth  daily.  90 capsule  0  . potassium chloride SA (K-DUR,KLOR-CON) 20 MEQ tablet       . telmisartan (MICARDIS) 80 MG tablet take 1 tablet by mouth once daily  30 tablet  3   No current facility-administered medications on file prior to visit.    BP 160/90  Pulse 73  Temp(Src) 98.6 F (37 C) (Oral)  Resp 16  Ht 6\' 1"  (1.854 m)  Wt 339 lb (153.769 kg)  BMI 44.74 kg/m2  SpO2 98%       Objective:   Physical Exam  Constitutional: He appears well-developed and well-nourished. No distress.  Cardiovascular: Normal rate and regular rhythm.   No murmur heard. Pulmonary/Chest: Effort normal and breath sounds normal. No respiratory distress. He  has no wheezes. He has no rales. He exhibits no tenderness.  Musculoskeletal:  Mild swelling at base of left  1st metacarpal. No erythema          Assessment & Plan:

## 2013-05-16 NOTE — Assessment & Plan Note (Signed)
Pt to continue current meds.  Hopefully BP will improve with regular use of micardis.

## 2013-05-16 NOTE — Patient Instructions (Addendum)
Please complete your lab work prior to leaving.  

## 2013-05-19 ENCOUNTER — Encounter (HOSPITAL_COMMUNITY): Payer: Medicare Other

## 2013-05-19 ENCOUNTER — Encounter: Payer: Self-pay | Admitting: Family

## 2013-05-20 ENCOUNTER — Encounter: Payer: Self-pay | Admitting: Cardiovascular Disease

## 2013-05-20 ENCOUNTER — Ambulatory Visit (INDEPENDENT_AMBULATORY_CARE_PROVIDER_SITE_OTHER): Payer: Medicare Other | Admitting: Cardiovascular Disease

## 2013-05-20 VITALS — BP 162/90 | HR 70 | Wt 339.0 lb

## 2013-05-20 DIAGNOSIS — I10 Essential (primary) hypertension: Secondary | ICD-10-CM | POA: Diagnosis not present

## 2013-05-20 DIAGNOSIS — I712 Thoracic aortic aneurysm, without rupture: Secondary | ICD-10-CM | POA: Diagnosis not present

## 2013-05-20 DIAGNOSIS — I509 Heart failure, unspecified: Secondary | ICD-10-CM

## 2013-05-20 DIAGNOSIS — Z954 Presence of other heart-valve replacement: Secondary | ICD-10-CM

## 2013-05-20 DIAGNOSIS — I5033 Acute on chronic diastolic (congestive) heart failure: Secondary | ICD-10-CM | POA: Diagnosis not present

## 2013-05-20 DIAGNOSIS — Z952 Presence of prosthetic heart valve: Secondary | ICD-10-CM

## 2013-05-20 DIAGNOSIS — I2510 Atherosclerotic heart disease of native coronary artery without angina pectoris: Secondary | ICD-10-CM

## 2013-05-20 MED ORDER — FUROSEMIDE 40 MG PO TABS: ORAL_TABLET | ORAL | Status: DC

## 2013-05-20 NOTE — Patient Instructions (Signed)
Your physician wants you to follow-up in: 6 months.  You will receive a reminder letter in the mail two months in advance. If you don't receive a letter, please call our office to schedule the follow-up appointment.  Your physician has recommended you make the following change in your medication:  Increase furosemide to 80 mg by mouth daily in the morning and 40 mg in the afternoon.

## 2013-05-20 NOTE — Progress Notes (Signed)
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 He was referred in May 2010 for SOB, abdominal swelling and LE edema. He was diagnosed with a non-ischemic CM, moderate AI, non-obstructive CAD and a dilated aortic root. He underwent a right and left heart cath June 2010 with non-obstructive coronary artery disease. A CT angiogram was performed in June 2010 to look closely at his aortic root and showed moderate dilation of the aortic root (root 4.6 cm, ascending aorta 3.8cm). Echo in the spring of 2011 showed enlarging aortic root. I planned on performing an MRI to look at his valve, degree of AI and size of root but he was too large for the scanner. His aortic root continued to enlarge. I saw him 11/13/11 and arranged a cardiac cath on 11/22/11 which showed enlarged root, no obstructive disease in the left system. I could not engage the RCA secondary to the abnormal takeoff with the enlarged aortic root. CTA was performed and showed dilated aortic root with patent RCA. He underwent aortic root replacement, aortic valve replacement and SVG to RCA on 12/28/11. BP has been elevated. Started on Micardis 05/14/13 in primary care.   He is here today for follow up. No chest pain or SOB. No near syncope or syncope. He feels great. He has gained 36 lbs over last year. Some LE edema but mostly notices this in his waist size. He has been exercising and doing well.   Primary Care Physician: Charlynn Court  Last Lipid Profile:Lipid Panel     Component Value Date/Time   CHOL 156 01/14/2013 1618   TRIG 124 01/14/2013 1618   HDL 36* 01/14/2013 1618   CHOLHDL 4.3 01/14/2013 1618   VLDL 25 01/14/2013 1618   LDLCALC 95 01/14/2013 1618     Past Medical History  Diagnosis Date  .  Hypertension   . Aortic insufficiency      02/16/09 Chest CT:  The aortic root has a diameter measuring 4.6 cm, image 64 of the coronal series.  The ascending    thoracic aorta has a diameter of 3.8 cm, image 65 of the coronal series.  At the level of the aortic arch    the thoracic aorta has a maximum diameter of 3.2 cm.  The descending thoracic aorta has a maximum           diameter of 3.2 cm.  There is no evidence for aortic disse        . GERD (gastroesophageal reflux disease)   . Hypokalemia   . Helicobacter pylori gastritis     (Tx Pylera 4/10)  . Cardiomyopathy      01/2009 - Cath - nonobs. dzs.  EF 35%.  . Gallstones     s/p cholecystectomy    . Sleep apnea     occasionally wears CPAP at night  . CAD (coronary artery disease)   . Aortic root enlargement     The aortic root has a diameter measuring 4.6 cm, image 64 of the coronal series.  The ascending  thoracic aorta has a diameter of 3.8 cm, image 65 of the coronal series.  At the level of the aortic arch   the thoracic aorta has a maximum diameter of 3.2 cm.  The descending thoracic aorta  has a maximum   diameter of 3.2 cm.  There is no evidence for aortic dissection.       . Iron deficiency anemia   . CHF (congestive heart failure)   . Obesity, Class III, BMI 40-49.9 (morbid obesity)   . Heart murmur   . PONV (postoperative nausea and vomiting)   . 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 Diabetes mellitus   . Dyslipidemia 01/14/2013    Past Surgical History  Procedure Laterality Date  . Cholecystectomy    . Adrenalectomy    . Colonoscopy w/ biopsies and polypectomy  11/04/2008    colon polyps, diverticulosis   . Upper gastrointestinal endoscopy  11/04/2008    w/bx, H pylori  gastritis  . Upper gastrointestinal endoscopy  09/25/2011  . Colonoscopy  09/25/2011  . Multiple tooth extractions    . Tee without cardioversion  11/22/2011    Procedure: TRANSESOPHAGEAL ECHOCARDIOGRAM (TEE);  Surgeon: Laurey Morale, MD;  Location: [facility] ENDOSCOPY;  Service: Cardiovascular;  Laterality: N/A;  . Cardiac catheterization      2013  . Cardiac valve replacement  12/28/2011    Bentall with tissue valve  . Coronary artery bypass graft  12/28/2011    Procedure: CORONARY ARTERY BYPASS GRAFTING (CABG);  Surgeon: Purcell Nails, MD;  Location: [facility] OR;  Service: Open Heart Surgery;  Laterality: N/A;  cabg x 1  . Bentall procedure  12/28/2011    Procedure: BENTALL PROCEDURE;  Surgeon: Purcell Nails, MD;  Location: [facility] OR;  Service: Open Heart Surgery;  Laterality: N/A;    Current Outpatient Prescriptions  Medication Sig Dispense Refill  . amLODipine (NORVASC) 10 MG tablet Take 1 tablet by mouth   daily  90 tablet  1  . aspirin EC 81 MG tablet Take 81 mg by mouth daily.      . carvedilol (COREG) 25 MG tablet Take 1 tablet by mouth  twice a day with a meal  10 tablet  0  . ferrous sulfate 325 (65 FE) MG tablet Take 325 mg by mouth 2 (two) times daily before a meal.      . furosemide (LASIX) 40 MG tablet Take 1 tablet by mouth  twice a day  180 tablet  1  . hydrochlorothiazide (HYDRODIURIL) 25 MG tablet Take 1 tablet by mouth  daily.  90 tablet  1  . Multiple Vitamin (MULITIVITAMIN WITH MINERALS) TABS Take 1 tablet by mouth daily.      Marland Kitchen omeprazole (PRILOSEC) 40 MG capsule Take 1 capsule by mouth  daily.  90 capsule  0  . potassium chloride SA (K-DUR,KLOR-CON) 20 MEQ tablet       . telmisartan (MICARDIS) 80 MG tablet take 1 tablet by mouth once daily  30 tablet  3   No current facility-administered medications for this visit.    Allergies  Allergen Reactions  . Latex Hives    Latex gloves     History   Social History  . Marital Status: Married    Spouse Name: Steward Drone    Number of  Children: 0  . Years of Education: N/A   Occupational History  .      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  Smoking status: Never Smoker   . Smokeless tobacco: Never Used  . Alcohol Use: No  . Drug Use: No  . Sexual Activity: Not on file   Other Topics Concern  . Not on file   Social History Narrative   The patient is married, lives with his wife in  [place].  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  He  lives a very sedentary lifestyle.  He is a nonsmoker.  He denies alcohol consumption.     Family History  Problem Relation Age of Onset  . Breast cancer Sister   . Colon cancer Brother 70    at least 70  . Prostate cancer Brother   . Diabetes Sister     5 siblings  . Stomach cancer Sister   . Hypertension Mother   . Kidney failure Sister   . Colon cancer Sister   . Breast cancer Sister   . Stroke Father     Review of Systems:  As stated in the HPI and otherwise negative.   BP 162/90  Pulse 70  Wt 339 lb (153.769 kg)  BMI 44.74 kg/m2  Physical Examination: General: Well developed, well nourished, NAD HEENT: OP clear, mucus membranes moist SKIN: warm, dry. No rashes. Neuro: No focal deficits Musculoskeletal: Muscle strength 5/5 all ext Psychiatric: Mood and affect normal Neck: No JVD, no carotid bruits, no thyromegaly, no lymphadenopathy. Lungs:Clear bilaterally, no wheezes, rhonci, crackles Cardiovascular: Regular rate and rhythm. Systolic murmur. No gallops or rubs. Abdomen:Soft. Bowel sounds present. Non-tender.  Extremities: Trace bilateral lower extremity edema. Pulses are 2 + in the bilateral DP/PT.  EKG: Sinus, rate 70 bpm. 1st degree AV block. RBBB  Echo 04/02/12: Left ventricle: The cavity size was normal. There was severe concentric hypertrophy. The estimated ejection fraction was 55%. - Aortic valve: Normal appearing tissue AVR - Left atrium: The atrium was mildly dilated. - Atrial septum: Poorly  visualized Cannot R/O PFO - Pericardium, extracardiac: Small mostly posterior pericardial effusion  Assessment and Plan:   1. CAD, NATIVE VESSEL: S/p single vessel bypass to RCA. Stable. Continue ASA and beta blocker.   2. Acute on chronic diastolic CHF NYHA class 1: Weight is up 36 lbs over last year. Some LE edema. Will increase Lasix to 80 mg po QAM, 40 mg po QPM.   3. HYPERTENSION: BP elevated today. Managed in primary care. Will continue Coreg, Norvasc and Micardis/HCT.   4. S/P aortic valve replacement and aortoplasty: Stable. LV function normal by echo August 2013. AVR working well.

## 2013-05-21 ENCOUNTER — Encounter (HOSPITAL_COMMUNITY): Payer: Medicare Other

## 2013-05-23 ENCOUNTER — Encounter (HOSPITAL_COMMUNITY): Payer: Medicare Other

## 2013-05-26 ENCOUNTER — Encounter (HOSPITAL_COMMUNITY): Payer: Medicare Other

## 2013-05-28 ENCOUNTER — Encounter (HOSPITAL_COMMUNITY): Payer: Medicare Other

## 2013-05-28 ENCOUNTER — Other Ambulatory Visit: Payer: Self-pay | Admitting: Family Medicine

## 2013-05-28 NOTE — Telephone Encounter (Signed)
Rx request to pharmacy/SLS  

## 2013-05-30 ENCOUNTER — Encounter (HOSPITAL_COMMUNITY): Payer: Medicare Other

## 2013-06-02 ENCOUNTER — Encounter (HOSPITAL_COMMUNITY): Payer: Medicare Other

## 2013-06-04 ENCOUNTER — Encounter (HOSPITAL_COMMUNITY): Payer: Medicare Other

## 2013-06-06 ENCOUNTER — Encounter (HOSPITAL_COMMUNITY): Payer: Medicare Other

## 2013-06-09 ENCOUNTER — Encounter (HOSPITAL_COMMUNITY): Payer: Medicare Other

## 2013-06-11 ENCOUNTER — Encounter: Payer: Self-pay | Admitting: Podiatrist

## 2013-06-11 ENCOUNTER — Encounter (HOSPITAL_COMMUNITY): Payer: Medicare Other

## 2013-06-11 ENCOUNTER — Ambulatory Visit (INDEPENDENT_AMBULATORY_CARE_PROVIDER_SITE_OTHER): Payer: Medicare Other | Admitting: Podiatrist

## 2013-06-11 VITALS — Ht 73.0 in | Wt 330.0 lb

## 2013-06-11 DIAGNOSIS — B351 Tinea unguium: Secondary | ICD-10-CM

## 2013-06-11 DIAGNOSIS — M79609 Pain in unspecified limb: Secondary | ICD-10-CM | POA: Diagnosis not present

## 2013-06-11 NOTE — Patient Instructions (Signed)
Call if any ?'s or concerns 

## 2013-06-11 NOTE — Progress Notes (Signed)
  Subjective:    Patient ID: David Parsons, male    DOB: 12-Jan-1941, 72 y.o.   MRN: [id number]  HPI trim nails    Review of Systems  Constitutional: Negative.   HENT: Negative.   Eyes: Negative.   Respiratory: Negative.   Cardiovascular: Negative.   Endocrine: Negative.   Genitourinary: Negative.   Musculoskeletal: Negative.   Skin: Negative.   Allergic/Immunologic: Negative.   Neurological: Negative.   Hematological: Negative.   Psychiatric/Behavioral: Negative.        Objective:   Physical Exam  neuro vascular status is intact and unchanged. Patient's toenails are elongated thick and discolored dystrophic friable brittle and clinically mycotic.      Assessment & Plan:  Assessment: Painful mycotic nails Plan:Discussed etiology, pathology, conservative vs. Surgical therapies and at this time debridement of symptomatic toenails was recommended.  Onychoreduction of symptomatic toenails was performed without iatrogenic incident.  Patient was instructed on signs and symptoms of infection and was told to call immediately should any of these arise.

## 2013-06-13 ENCOUNTER — Encounter (HOSPITAL_COMMUNITY): Payer: Medicare Other

## 2013-06-16 ENCOUNTER — Encounter (HOSPITAL_COMMUNITY): Payer: Medicare Other

## 2013-06-18 ENCOUNTER — Encounter (HOSPITAL_COMMUNITY): Payer: Medicare Other

## 2013-06-18 ENCOUNTER — Telehealth: Payer: Self-pay | Admitting: Cardiovascular Disease

## 2013-06-18 NOTE — Telephone Encounter (Signed)
Agree. Thanks. cdm 

## 2013-06-18 NOTE — Telephone Encounter (Signed)
Spoke with pt. He reports shortness of breath with exertion for last 2-3 days. Goes away with rest.   This AM he went to vote and became short of breath when going up stairs. Also described "sharp" pain down right side of head to shoulder earlier today. . Lasted about 10 seconds. No weakness, slurred speech or other symptoms. No swelling. Has not weighed in last couple of days.  Pt reports he has only been taking Lasix once daily for last week or so.  He states it was making him go to the bathroom a lot and interfering with his activities.  I told pt he should resume Lasix 80 mg every AM and 40 mg every afternoon as instructed at last office visit. He will let us know if shortness of breath continues after making this change. He will also weigh daily and call us if wt increases.

## 2013-06-18 NOTE — Telephone Encounter (Signed)
New Problem  Pt's wife called states he is "Giving out of breath, with pain flowing down his head to the left shoulder" request a call back to discuss.

## 2013-06-19 ENCOUNTER — Encounter: Payer: Self-pay | Admitting: Physician Assistant

## 2013-06-19 ENCOUNTER — Ambulatory Visit (INDEPENDENT_AMBULATORY_CARE_PROVIDER_SITE_OTHER): Payer: Medicare Other | Admitting: Physician Assistant

## 2013-06-19 VITALS — BP 128/72 | HR 79 | Temp 97.9°F | Resp 20 | Ht 73.0 in | Wt 334.2 lb

## 2013-06-19 DIAGNOSIS — I509 Heart failure, unspecified: Secondary | ICD-10-CM

## 2013-06-19 LAB — COMPREHENSIVE METABOLIC PANEL
ALT: 12 U/L (ref 0–53)
AST: 15 U/L (ref 0–37)
Albumin: 3.9 g/dL (ref 3.5–5.2)
BUN: 14 mg/dL (ref 6–23)
CO2: 26 mEq/L (ref 19–32)
Calcium: 8.9 mg/dL (ref 8.4–10.5)
Chloride: 108 mEq/L (ref 96–112)
Creat: 0.94 mg/dL (ref 0.50–1.35)
Glucose, Bld: 90 mg/dL (ref 70–99)
Potassium: 4.1 mEq/L (ref 3.5–5.3)

## 2013-06-19 NOTE — Patient Instructions (Signed)
Please take your Lasix as prescribed by Cardiology.  Please obtain labs.  I will call you with your results.

## 2013-06-19 NOTE — Progress Notes (Signed)
Patient ID: David Parsons, male   DOB: 04/03/1941, 72 y.o.   MRN: [id number]  Patient presents to clinic today c/o increased dyspnea on exertion over the past 4 days.  Patient has history of Stage I diastolic heart failure and is managed by Cardiology.  Patient states he has been trying to increase his level of exercise in order to lose weight.  States he gets short of breath when climbing stairs.  Denies dyspnea at rest.  Denies chest pain, palpitations, lightheadedness, dizziness.  Denies increase in abdominal distention.  Denies leg swelling.  His Wife called Cardiology yesterday w/ same complaint.  Patient was instructed to take 80 mg lasix in the am and 40 mg lasix in the pm.  Patient states this was his prescribed dosage but he hadn't been taking the evening dose.    Past Medical History  Diagnosis Date  . Hypertension   . Aortic insufficiency      02/16/09 Chest CT:  The aortic root has a diameter measuring 4.6 cm, image 64 of the coronal series.  The ascending    thoracic aorta has a diameter of 3.8 cm, image 65 of the coronal series.  At the level of the aortic arch    the thoracic aorta has a maximum diameter of 3.2 cm.  The descending thoracic aorta has a maximum           diameter of 3.2 cm.  There is no evidence for aortic disse        . GERD (gastroesophageal reflux disease)   . Hypokalemia   . Helicobacter pylori gastritis     (Tx Pylera 4/10)  . Cardiomyopathy      01/2009 - Cath - nonobs. dzs.  EF 35%.  . Gallstones     s/p cholecystectomy    . Sleep apnea     occasionally wears CPAP at night  . CAD (coronary artery disease)   . Aortic root enlargement     The aortic root has a diameter measuring 4.6 cm, image 64 of the coronal series.  The ascending  thoracic aorta has a diameter of 3.8 cm, image 65 of the coronal series.  At the level of the aortic arch   the thoracic aorta has a maximum diameter of 3.2 cm.  The descending thoracic aorta has a maximum   diameter of 3.2 cm.   There is no evidence for aortic dissection.       . Iron deficiency anemia   . CHF (congestive heart failure)   . Obesity, Class III, BMI 40-49.9 (morbid obesity)   . Heart murmur   . PONV (postoperative nausea and vomiting)   . 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 Diabetes mellitus   . Dyslipidemia 01/14/2013    Current Outpatient Prescriptions on File Prior to Visit  Medication Sig Dispense Refill  . amLODipine (NORVASC) 10 MG tablet Take 1 tablet by mouth   daily  90 tablet  1  . aspirin EC 81 MG tablet Take 81 mg by mouth daily.      . carvedilol (COREG) 25 MG tablet Take 1 tablet by mouth  twice a day with a meal  10 tablet  0  . ferrous sulfate 325 (65 FE) MG tablet  Take 325 mg by mouth 2 (two) times daily before a meal.      . furosemide (LASIX) 40 MG tablet Take 2 tablets every morning and 1 tablet every afternoon  90 tablet  6  . hydrochlorothiazide (HYDRODIURIL) 25 MG tablet Take 1 tablet by mouth  daily.  90 tablet  1  . Multiple Vitamin (MULITIVITAMIN WITH MINERALS) TABS Take 1 tablet by mouth daily.      Marland Kitchen omeprazole (PRILOSEC) 40 MG capsule Take 1 capsule by mouth   daily.  30 capsule  3  . potassium chloride SA (K-DUR,KLOR-CON) 20 MEQ tablet       . telmisartan (MICARDIS) 80 MG tablet take 1 tablet by mouth once daily  30 tablet  3   No current facility-administered medications on file prior to visit.    Allergies  Allergen Reactions  . Latex Hives    Latex gloves     Family History  Problem Relation Age of Onset  . Breast cancer Sister   . Colon cancer Brother 70    at least 70  . Prostate cancer Brother   . Diabetes Sister     5 siblings  . Stomach cancer Sister   . Hypertension Mother   . Kidney failure  Sister   . Colon cancer Sister   . Breast cancer Sister   . Stroke Father     History   Social History  . Marital Status: Married    Spouse Name: Steward Drone    Number of Children: 0  . Years of Education: N/A   Occupational History  .      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 Smoking status: Never Smoker   . Smokeless tobacco: Never Used  . Alcohol Use: No  . Drug Use: No  . Sexual Activity: None   Other Topics Concern  . None   Social History Narrative   The patient is married, lives with his wife in  [place].  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  He  lives a very sedentary lifestyle.  He is a nonsmoker.  He denies alcohol consumption.    ROS See HPI.  All other ROS are negative.   Filed Vitals:   06/19/13 1407  BP: 128/72  Pulse: 79  Temp: 97.9 F (36.6 C)  Resp: 20   Physical Exam  Vitals reviewed. Constitutional: He is oriented to person, place, and time and well-developed, well-nourished, and in no distress.  HENT:  Head: Normocephalic and atraumatic.  Eyes: Conjunctivae are normal.  Neck: Neck supple.  Cardiovascular: Normal rate, regular rhythm, normal heart sounds, intact distal pulses and normal pulses.   No pitting edema noted on exam of bilateral lower extremities.  Pulmonary/Chest: Effort normal and breath sounds normal. No respiratory distress. He has no wheezes. He has no rales. He exhibits no tenderness.  Neurological: He is alert and oriented to person, place, and time. No cranial nerve deficit.  Skin: Skin is warm and dry. No rash noted.  Psychiatric: Affect normal.   Recent Results (from the past 2160 hour(s))  BASIC METABOLIC PANEL     Status: None   Collection Time    05/09/13 11:45 AM      Result Value Range   Sodium 140  135 - 145 mEq/L   Potassium 4.1  3.5 - 5.3 mEq/L   Chloride 105  96 - 112 mEq/L   CO2 29  19 - 32  mEq/L   Glucose, Bld 98  70 - 99 mg/dL   BUN 10  6 - 23 mg/dL    Creat 2.95  6.21 - 3.08 mg/dL   Calcium 9.4  8.4 - 65.7 mg/dL  HEMOGLOBIN Q4O     Status: None   Collection Time    05/09/13 11:45 AM      Result Value Range   Hemoglobin A1C 5.5  <5.7 %   Comment:                                                                            According to the ADA Clinical Practice Recommendations for 2011, when     HbA1c is used as a screening test:             >=6.5%   Diagnostic of Diabetes Mellitus                (if abnormal result is confirmed)           5.7-6.4%   Increased risk of developing Diabetes Mellitus           References:Diagnosis and Classification of Diabetes Mellitus,Diabetes     Care,2011,34(Suppl 1):S62-S69 and Standards of Medical Care in             Diabetes - 2011,Diabetes Care,2011,34 (Suppl 1):S11-S61.         Mean Plasma Glucose 111  <117 mg/dL  URIC ACID     Status: None   Collection Time    05/16/13  4:05 PM      Result Value Range   Uric Acid, Serum 5.4  4.0 - 7.8 mg/dL  RHEUMATOID FACTOR     Status: None   Collection Time    05/16/13  4:05 PM      Result Value Range   Rheumatoid Factor <10  <=14 IU/mL   Comment:                                Interpretive Table                         Low Positive: 15 - 41 IU/mL                         High Positive:  >= 42 IU/mL            In addition to the RF result, and clinical symptoms including joint      involvement, the 2010 ACR Classification Criteria for      scoring/diagnosing Rheumatoid Arthritis include the results of the      following tests:  CRP (96295), ESR (15010), and CCP (APCA) (28413).      www.rheumatology.org/practice/clinical/classification/ra/ra_2010.asp    Assessment/Plan: No problem-specific assessment & plan notes found for this encounter.

## 2013-06-19 NOTE — Assessment & Plan Note (Signed)
Followed by Cardiology.  Patient to take medication as discussed by Cardiology yesterday -- Lasix 80 mg QAm and 40 mg QPM.  Will obtain CMP to assess electrolyte levels.  Patient to follow-up with Cardiology.  If symptoms acutely worsen or if patient develops chest pain, SOB at rest, palpitations, LH, dizziness, patient is to proceed to ER.

## 2013-06-20 ENCOUNTER — Encounter (HOSPITAL_COMMUNITY): Payer: Medicare Other

## 2013-06-20 ENCOUNTER — Encounter: Payer: Self-pay | Admitting: Physician Assistant

## 2013-08-05 ENCOUNTER — Ambulatory Visit: Payer: Medicare Other | Admitting: Family Medicine

## 2013-08-18 ENCOUNTER — Other Ambulatory Visit: Payer: Self-pay | Admitting: Family

## 2013-08-18 NOTE — Telephone Encounter (Signed)
Rx request to pharmacy/SLS  

## 2013-09-05 IMAGING — CT CT HEART MORP W/ CTA COR W/ SCORE W/ CA W/CM &/OR W/O CM
1 series · 13 of 20 positions shown, 17 images · non-contrast
Comparison: Chest CT 11/03/2011.

OVER-READ INTERPRETATION - CT CHEST

The following report is an over-read performed by radiologist Dr.
[DATE].  This over-read does not include interpretation of
cardiac or coronary anatomy or pathology.  The CTA interpretation
by the cardiologist is attached.
INDICATION: Ascending aortic aneurysm.  Need for AVR with root
replacement  Unable to adequately visualize coronary arteries
during catheterization
PROTOCOL: Patient scanned on a Philips 256 with gantry rotation
speed of 270 msec and collimation of .9mm  The patient was not an
ideal patient for CTA.  His weight was over 350 lbs.  It took over
two hours to place a suitable i.v. for contrast injection at
5cc/sec.  US guidance was finally used.  Despite 15mg of i.v.
lopresser and amiodarone the patient had frequent PAC;s.  We left
KV at 120 and did not use idose because of the patient size.  SL
nitro was given.  A retrospective protocol was used due to
arrhythmia.  Following the attempt at cardiac CTA using 80cc of
contrast the patient had a non gated CTA from the arch to mid femur
to further assess his aortic aneurysm

[mpr, axial, axial · axial · 0.49mm/px · z∈[-255,-63]mm · 13 of 72 slices shown, 17 images]
[im 4/72  vessel]
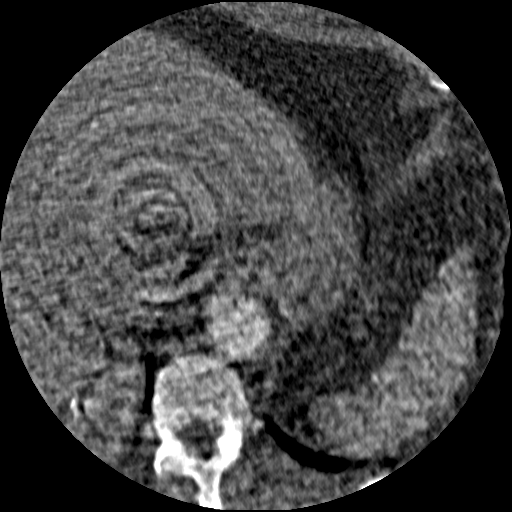
[im 4/72  lung]
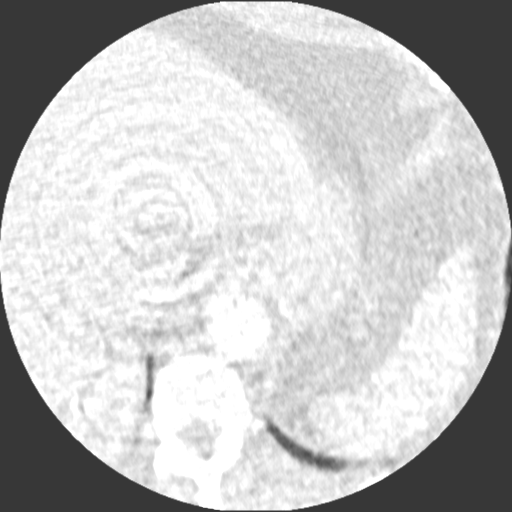
[im 12/72  vessel]
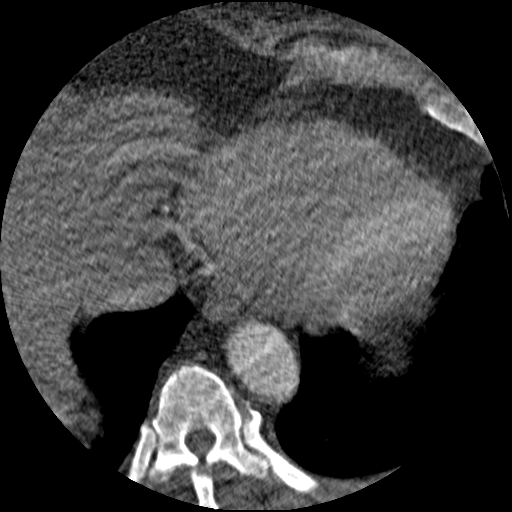
[im 15/72  vessel]
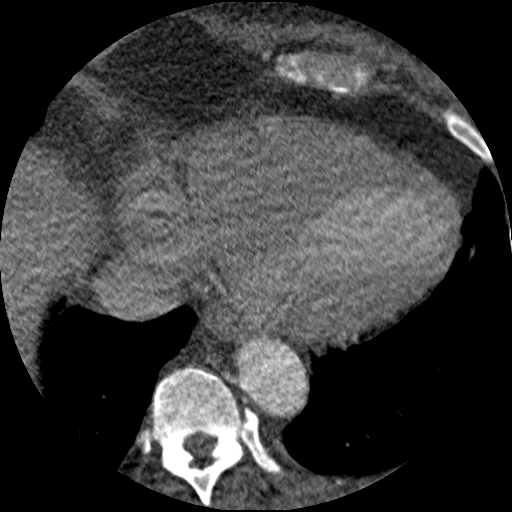
[im 19/72  vessel]
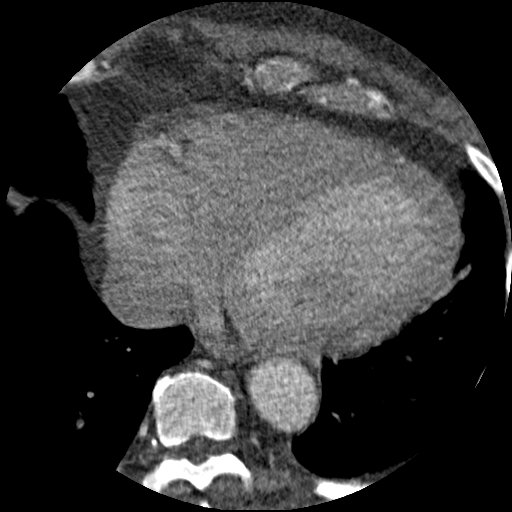
[im 27/72  vessel]
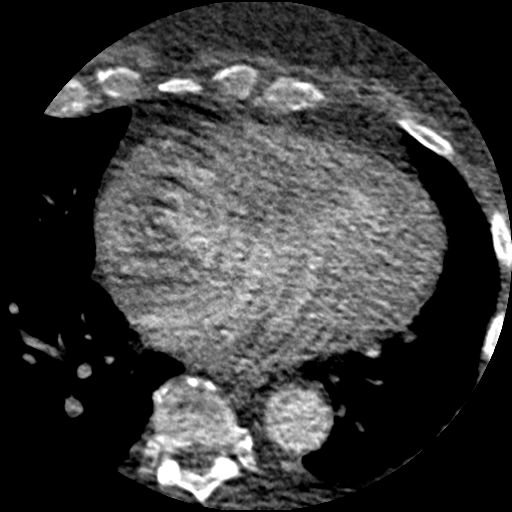
[im 27/72  lung]
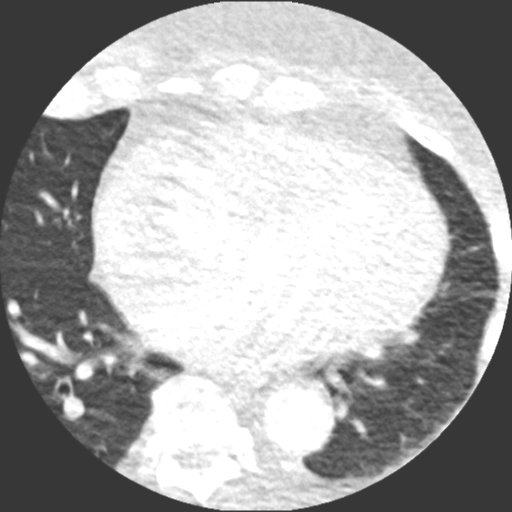
[im 30/72  vessel]
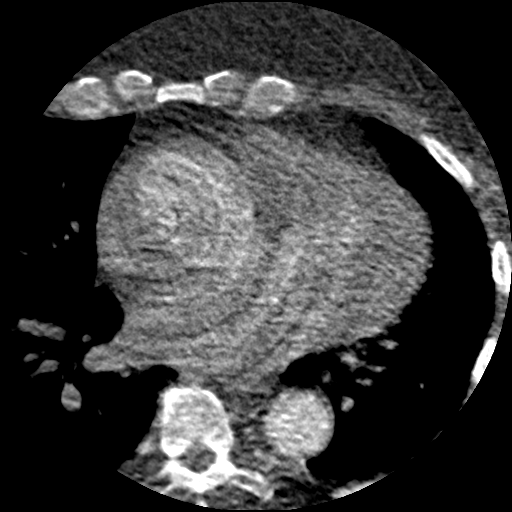
[im 38/72  vessel]
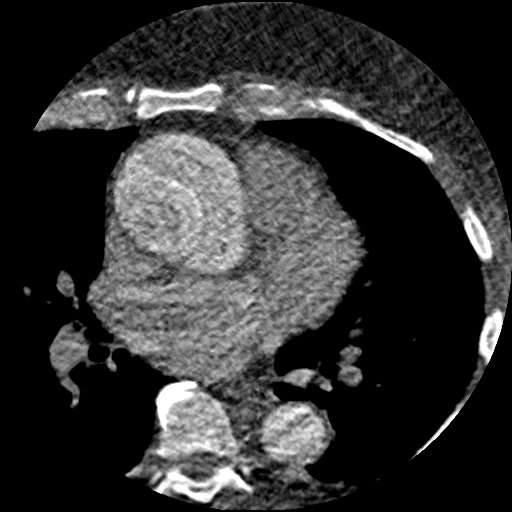
[im 42/72  vessel]
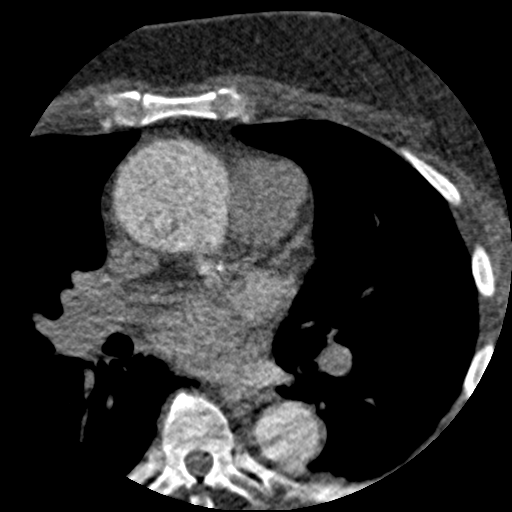
[im 45/72  vessel]
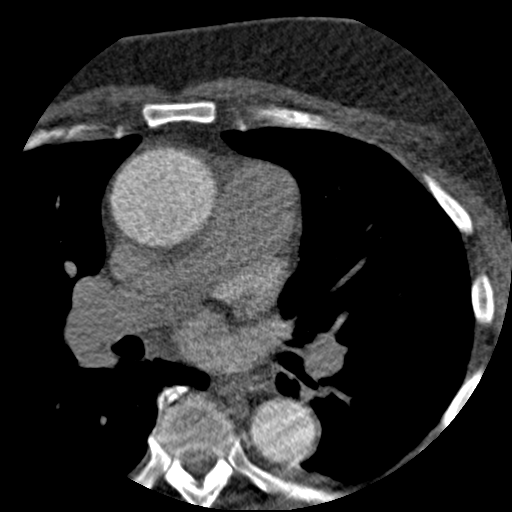
[im 45/72  lung]
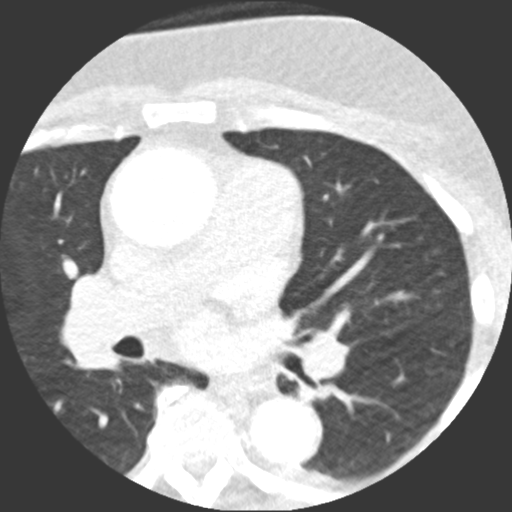
[im 53/72  vessel]
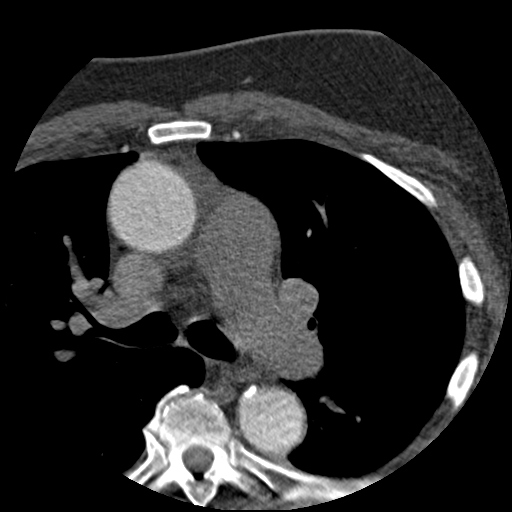
[im 57/72  vessel]
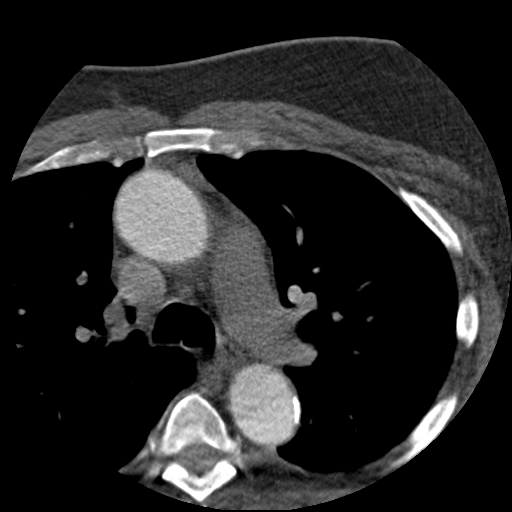
[im 60/72  vessel]
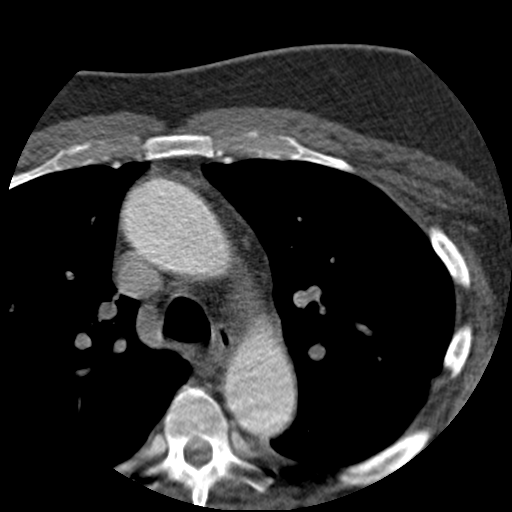
[im 68/72  vessel]
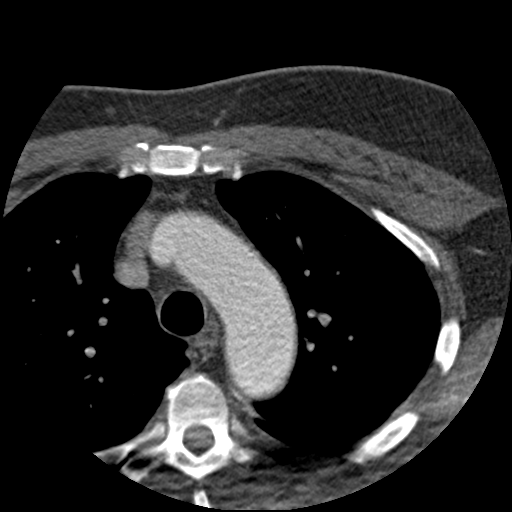
[im 68/72  lung]
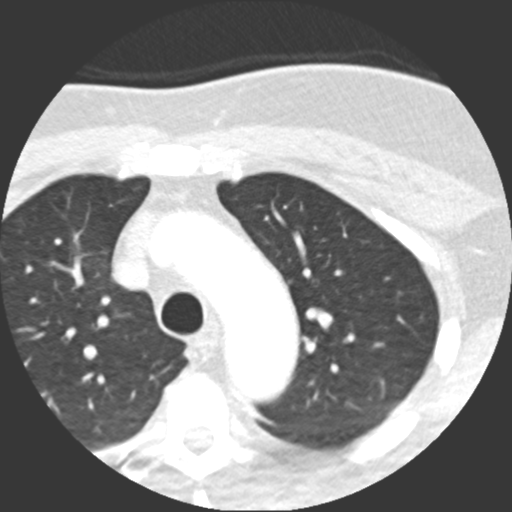

[13 of 20 positions shown; findings below may reference images not displayed]

FINDINGS: Again noted is an aneurysm of the proximal ascending
thoracic aorta which measures up to 6.2 cm in diameter (this has
increased compared to the prior examination, however, the prior
examination was not gated making a direct comparison is difficult).
No consolidative airspace disease, suspicious-appearing pulmonary
nodules or pleural effusions within the visualized portions of the
thorax.  Visualized upper abdomen is unremarkable. There are no
aggressive appearing lytic or blastic lesions noted in the
visualized portions of the skeleton.
IMPRESSION: 1.  Marked aneurysmal dilatation of the proximal descending
thoracic aorta (immediately above the level of the sinuses of
Valsalva) which appears to have increased compared to the recent
prior examination.  This rapid expansion could suggest an unstable
aneurysm.

These results were called by telephone on 12/13/2011  at  [DATE]
p.m. to  Dr. Urgesa, who verbally acknowledged these results. These
results were also communicated to Dr. Kat of Cardiothoracic
Surgery on 12/13/2011 at [DATE] p.m..

Cardiac CTA:
Coronary Arteries:  The scan was nondiagnositc.  There was moderate
calcification of the distal LM and proximal LAD.  The LM, proximal
LAD and proximal section of a large diagonal branch were patent.
The proximal circumflex was patent.  The origin of the left main
was normal from the left cusp.  The origin of the RCA was not
visualized.  The proximal RCA was patent with mild calcification.

Gated Images showed left ventricular enlargement with normal LV
systolic function.  EF could not be quantified due to poor cavity
opacification.

The AV was trileaflet.  See dictation from Radiology regarding
severe dilatation of the sinus of valsalva area of the aorta.   The
ascending aorta measures 5.4 cm diameter at the sinotubular  and
5.6 cm in the proximal ascending aorta.  Normal origian of the
great vessels from the aortic arch.
IMPRESSION: 1)    Nondiagnostic coronary CTA due to morbid obesity and
      arrhythmia.  Moderate calcification of distal LM and proximal LAD.
      Normal origin of LM and patent proximal LAD and first diagonal.
      Mild calcification of prosimal RCA.  Origin not seen.  Patent
      proximal RCA

2)    Qualitatively normal EF on retrospective gated images with
LVE
3)    Trileaflet AV with severe aneurysmal dilatation of the
ascending aorta 5.6 cm see radiology report of nongated aortic CTA.

## 2013-09-10 ENCOUNTER — Ambulatory Visit (INDEPENDENT_AMBULATORY_CARE_PROVIDER_SITE_OTHER): Payer: Medicare Other | Admitting: Podiatrist

## 2013-09-10 ENCOUNTER — Encounter: Payer: Self-pay | Admitting: Podiatrist

## 2013-09-10 VITALS — BP 136/70 | HR 72 | Resp 18

## 2013-09-10 DIAGNOSIS — M79609 Pain in unspecified limb: Secondary | ICD-10-CM | POA: Diagnosis not present

## 2013-09-10 DIAGNOSIS — B351 Tinea unguium: Secondary | ICD-10-CM | POA: Diagnosis not present

## 2013-09-10 NOTE — Progress Notes (Signed)
I am here to get my toenails cut HPI:  Patient presents today for follow up of foot and nail care. Denies any new complaints today.  Objective:  Patients chart is reviewed.  Neurovascular status unchanged.  Patients nails are thickened, discolored, distrophic, friable and brittle with yellow-brown discoloration. Patient subjectively relates they are painful with shoes and with ambulation of bilateral feet.  Assessment:  Symptomatic onychomycosis  Plan:  Discussed treatment options and alternatives.  The symptomatic toenails were debrided through manual an mechanical means without complication.  Return appointment recommended at routine intervals of 3 months    Trudie Buckler, DPM

## 2013-09-15 ENCOUNTER — Encounter: Payer: Self-pay | Admitting: Family Medicine

## 2013-09-23 ENCOUNTER — Other Ambulatory Visit: Payer: Self-pay | Admitting: Internal Medicine

## 2013-10-09 ENCOUNTER — Ambulatory Visit: Payer: Medicare Other | Admitting: Family Medicine

## 2013-10-15 ENCOUNTER — Other Ambulatory Visit: Payer: Self-pay | Admitting: Family

## 2013-10-27 DIAGNOSIS — L910 Hypertrophic scar: Secondary | ICD-10-CM | POA: Diagnosis not present

## 2013-10-27 DIAGNOSIS — D485 Neoplasm of uncertain behavior of skin: Secondary | ICD-10-CM | POA: Diagnosis not present

## 2013-10-30 ENCOUNTER — Encounter: Payer: Self-pay | Admitting: Family Medicine

## 2013-10-30 ENCOUNTER — Ambulatory Visit (INDEPENDENT_AMBULATORY_CARE_PROVIDER_SITE_OTHER): Payer: Medicare Other | Admitting: Family Medicine

## 2013-10-30 VITALS — BP 132/78 | HR 71 | Temp 97.9°F | Ht 73.0 in | Wt 340.1 lb

## 2013-10-30 DIAGNOSIS — I10 Essential (primary) hypertension: Secondary | ICD-10-CM | POA: Diagnosis not present

## 2013-10-30 DIAGNOSIS — E785 Hyperlipidemia, unspecified: Secondary | ICD-10-CM | POA: Diagnosis not present

## 2013-10-30 DIAGNOSIS — D509 Iron deficiency anemia, unspecified: Secondary | ICD-10-CM

## 2013-10-30 DIAGNOSIS — E66813 Obesity, class 3: Secondary | ICD-10-CM

## 2013-10-30 DIAGNOSIS — I2510 Atherosclerotic heart disease of native coronary artery without angina pectoris: Secondary | ICD-10-CM

## 2013-10-30 NOTE — Progress Notes (Signed)
Pre visit review using our clinic review tool, if applicable. No additional management support is needed unless otherwise documented below in the visit note. 

## 2013-10-30 NOTE — Patient Instructions (Signed)
DASH Diet  The DASH diet stands for "Dietary Approaches to Stop Hypertension." It is a healthy eating plan that has been shown to reduce high blood pressure (hypertension) in as little as 14 days, while also possibly providing other significant health benefits. These other health benefits include reducing the risk of breast cancer after menopause and reducing the risk of type 2 diabetes, heart disease, colon cancer, and stroke. Health benefits also include weight loss and slowing kidney failure in patients with chronic kidney disease.   DIET GUIDELINES  · Limit salt (sodium). Your diet should contain less than 1500 mg of sodium daily.  · Limit refined or processed carbohydrates. Your diet should include mostly whole grains. Desserts and added sugars should be used sparingly.  · Include small amounts of heart-healthy fats. These types of fats include nuts, oils, and tub margarine. Limit saturated and trans fats. These fats have been shown to be harmful in the body.  CHOOSING FOODS   The following food groups are based on a 2000 calorie diet. See your Registered Dietitian for individual calorie needs.  Grains and Grain Products (6 to 8 servings daily)  · Eat More Often: Whole-wheat bread, brown rice, whole-grain or wheat pasta, quinoa, popcorn without added fat or salt (air popped).  · Eat Less Often: White bread, white pasta, white rice, cornbread.  Vegetables (4 to 5 servings daily)  · Eat More Often: Fresh, frozen, and canned vegetables. Vegetables may be raw, steamed, roasted, or grilled with a minimal amount of fat.  · Eat Less Often/Avoid: Creamed or fried vegetables. Vegetables in a cheese sauce.  Fruit (4 to 5 servings daily)  · Eat More Often: All fresh, canned (in natural juice), or frozen fruits. Dried fruits without added sugar. One hundred percent fruit juice (½ cup [237 mL] daily).  · Eat Less Often: Dried fruits with added sugar. Canned fruit in light or heavy syrup.  Lean Meats, Fish, and Poultry (2  servings or less daily. One serving is 3 to 4 oz [85-114 g]).  · Eat More Often: Ninety percent or leaner ground beef, tenderloin, sirloin. Round cuts of beef, chicken breast, turkey breast. All fish. Grill, bake, or broil your meat. Nothing should be fried.  · Eat Less Often/Avoid: Fatty cuts of meat, turkey, or chicken leg, thigh, or wing. Fried cuts of meat or fish.  Dairy (2 to 3 servings)  · Eat More Often: Low-fat or fat-free milk, low-fat plain or light yogurt, reduced-fat or part-skim cheese.  · Eat Less Often/Avoid: Milk (whole, 2%). Whole milk yogurt. Full-fat cheeses.  Nuts, Seeds, and Legumes (4 to 5 servings per week)  · Eat More Often: All without added salt.  · Eat Less Often/Avoid: Salted nuts and seeds, canned beans with added salt.  Fats and Sweets (limited)  · Eat More Often: Vegetable oils, tub margarines without trans fats, sugar-free gelatin. Mayonnaise and salad dressings.  · Eat Less Often/Avoid: Coconut oils, palm oils, butter, stick margarine, cream, half and half, cookies, candy, pie.  FOR MORE INFORMATION  The Dash Diet Eating Plan: www.dashdiet.org  Document Released: 07/27/2011 Document Revised: 10/30/2011 Document Reviewed: 07/27/2011  [employer]® Patient Information ©2014 [employer].

## 2013-11-02 NOTE — Assessment & Plan Note (Signed)
Improved on recheck. Well controlled, no changes to meds. Encouraged heart healthy diet such as the DASH diet and exercise as tolerated.  

## 2013-11-02 NOTE — Assessment & Plan Note (Signed)
Encouraged heart healthy diet, increase exercise, avoid trans fats, consider a krill oil cap daily 

## 2013-11-02 NOTE — Assessment & Plan Note (Signed)
Asymptomatic, follows with cardiology 

## 2013-11-02 NOTE — Progress Notes (Signed)
Patient ID: David Parsons, male   DOB: 10-12-1940, 73 y.o.   MRN: [id number] David Parsons [id number] 07/08/1941 11/02/2013      Progress Note-Follow Up  Subjective  Chief Complaint  Chief Complaint  Patient presents with  . Follow-up    3 month    HPI  Patient is a 73 year old male in today for routine medical care. Feeling well recently. Has seen dermatology recently. No concerning new lesions. Reports bp at home is in the 130s over 70s. Denies CP/palp/SOB/HA/congestion/fevers/GI or GU c/o. Taking meds as prescribed  Past Medical History  Diagnosis Date  . Hypertension   . Aortic insufficiency      02/16/09 Chest CT:  The aortic root has a diameter measuring 4.6 cm, image 64 of the coronal series.  The ascending    thoracic aorta has a diameter of 3.8 cm, image 65 of the coronal series.  At the level of the aortic arch    the thoracic aorta has a maximum diameter of 3.2 cm.  The descending thoracic aorta has a maximum           diameter of 3.2 cm.  There is no evidence for aortic disse        . GERD (gastroesophageal reflux disease)   . Hypokalemia   . Helicobacter pylori gastritis     (Tx Pylera 4/10)  . Cardiomyopathy      01/2009 - Cath - nonobs. dzs.  EF 35%.  . Gallstones     s/p cholecystectomy    . Sleep apnea     occasionally wears CPAP at night  . CAD (coronary artery disease)   . Aortic root enlargement     The aortic root has a diameter measuring 4.6 cm, image 64 of the coronal series.  The ascending  thoracic aorta has a diameter of 3.8 cm, image 65 of the coronal series.  At the level of the aortic arch   the thoracic aorta has a maximum diameter of 3.2 cm.  The descending thoracic aorta has a maximum   diameter of 3.2 cm.  There is no evidence for aortic dissection.       . Iron deficiency anemia   . CHF (congestive heart failure)   . Obesity, Class III, BMI 40-49.9 (morbid obesity)   . Heart murmur   . PONV (postoperative nausea and vomiting)   . 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 Diabetes mellitus   . Dyslipidemia 01/14/2013    Past Surgical History  Procedure Laterality Date  . Cholecystectomy    . Adrenalectomy    . Colonoscopy w/ biopsies and polypectomy  11/04/2008    colon polyps, diverticulosis   . Upper gastrointestinal endoscopy  11/04/2008    w/bx, H pylori gastritis  . Upper gastrointestinal endoscopy  09/25/2011  . Colonoscopy  09/25/2011  . Multiple tooth extractions    . Tee without cardioversion  11/22/2011    Procedure: TRANSESOPHAGEAL ECHOCARDIOGRAM (TEE);  Surgeon: Larey Dresser, MD;  Location: [facility];  Service: Cardiovascular;  Laterality: N/A;  . Cardiac catheterization      2013  . Cardiac valve replacement  12/28/2011    Bentall with tissue valve  . Coronary artery bypass graft  12/28/2011  Procedure: CORONARY ARTERY BYPASS GRAFTING (CABG);  Surgeon: Rexene Alberts, MD;  Location: [facility];  Service: Open Heart Surgery;  Laterality: N/A;  cabg x 1  . Bentall procedure  12/28/2011    Procedure: BENTALL PROCEDURE;  Surgeon: Rexene Alberts, MD;  Location: [facility];  Service: Open Heart Surgery;  Laterality: N/A;    Family History  Problem Relation Age of Onset  . Breast cancer Sister   . Colon cancer Brother 70    at least 16  . Prostate cancer Brother   . Diabetes Sister     5 siblings  . Stomach cancer Sister   . Hypertension Mother   . Kidney failure Sister   . Colon cancer Sister   . Breast cancer Sister   . Stroke Father     History   Social History  . Marital Status: Married    Spouse Name: [name]    Number of Children: 0  . Years of Education: N/A   Occupational History  .      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 Smoking status: Never Smoker   . Smokeless tobacco: Never Used  . Alcohol Use: No  . Drug Use: No  . Sexual Activity: Not on file   Other Topics Concern  . Not on file   Social History Narrative   The patient is married, lives with his wife in  [place].  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  He  lives a very sedentary lifestyle.  He is a nonsmoker.  He denies alcohol consumption.     Current Outpatient Prescriptions on File Prior to Visit  Medication Sig Dispense Refill  . amLODipine (NORVASC) 10 MG tablet Take 1 tablet by mouth   daily  90 tablet  1  . aspirin EC 81 MG tablet Take 81 mg by mouth daily.      . carvedilol (COREG) 25 MG tablet Take 1 tablet by mouth  twice a day with a meal  10 tablet  0  . ferrous sulfate 325 (65 FE) MG tablet Take 325 mg by mouth 2 (two) times daily before a meal.      . furosemide (LASIX) 40 MG tablet Take 2 tablets every morning and 1 tablet every afternoon  90 tablet  6  . hydrochlorothiazide (HYDRODIURIL) 25 MG tablet Take 1 tablet by mouth  daily.  90 tablet  1  . Multiple Vitamin (MULITIVITAMIN WITH MINERALS) TABS Take 1 tablet by mouth daily.      Marland Kitchen omeprazole (PRILOSEC) 40 MG capsule take 1 capsule by mouth once daily  30 capsule  2  . potassium chloride SA (K-DUR,KLOR-CON) 20 MEQ tablet       . telmisartan (MICARDIS) 80 MG tablet Take 1 tablet (80 mg total) by mouth daily.  30 tablet  0   No current facility-administered medications on file prior to visit.    Allergies  Allergen Reactions  . Latex Hives    Latex gloves     Review of Systems  Review of Systems  Constitutional: Negative for fever and malaise/fatigue.  HENT: Negative for congestion.   Eyes: Negative for discharge.  Respiratory: Negative for shortness of breath.   Cardiovascular: Negative for chest pain, palpitations and leg swelling.  Gastrointestinal: Negative for nausea, abdominal pain and diarrhea.  Genitourinary: Negative for dysuria.   Musculoskeletal: Negative for falls.  Skin: Negative for rash.  Neurological: Negative for loss  of consciousness and headaches.  Endo/Heme/Allergies: Negative for polydipsia.  Psychiatric/Behavioral: Negative for depression and suicidal ideas. The patient is not nervous/anxious and does not have insomnia.     Objective  BP 150/108  Pulse 71  Temp(Src) 97.9 F (36.6 C) (Oral)  Ht 6\' 1"  (1.854 m)  Wt 340 lb 1.3 oz (154.259 kg)  BMI 44.88 kg/m2  SpO2 94%  Physical Exam  Physical Exam  Constitutional: He is oriented to person, place, and time and well-developed, well-nourished, and in no distress. No distress.  HENT:  Head: Normocephalic and atraumatic.  Eyes: Conjunctivae are normal.  Neck: Neck supple. No thyromegaly present.  Cardiovascular: Normal rate, regular rhythm and normal heart sounds.   No murmur heard. Pulmonary/Chest: Effort normal and breath sounds normal. No respiratory distress.  Abdominal: He exhibits no distension and no mass. There is no tenderness.  Musculoskeletal: He exhibits no edema.  Neurological: He is alert and oriented to person, place, and time.  Skin: Skin is warm.  Psychiatric: Memory, affect and judgment normal.    Lab Results  Component Value Date   TSH 1.379 01/14/2013   Lab Results  Component Value Date   WBC 7.4 01/14/2013   HGB 14.0 01/14/2013   HCT 39.9 01/14/2013   MCV 84.0 01/14/2013   PLT 286 01/14/2013   Lab Results  Component Value Date   CREATININE 0.94 06/19/2013   BUN 14 06/19/2013   NA 141 06/19/2013   K 4.1 06/19/2013   CL 108 06/19/2013   CO2 26 06/19/2013   Lab Results  Component Value Date   ALT 12 06/19/2013   AST 15 06/19/2013   ALKPHOS 65 06/19/2013   BILITOT 0.7 06/19/2013   Lab Results  Component Value Date   CHOL 156 01/14/2013   Lab Results  Component Value Date   HDL 36* 01/14/2013   Lab Results  Component Value Date   LDLCALC 95 01/14/2013   Lab Results  Component Value Date   TRIG 124  01/14/2013   Lab Results  Component Value Date   CHOLHDL 4.3 01/14/2013     Assessment & Plan  HYPERTENSION Improved on recheck. Well controlled, no changes to meds. Encouraged heart healthy diet such as the DASH diet and exercise as tolerated.   Obesity, Class III, BMI 40-49.9 (morbid obesity) Encouraged DASH diet, decrease po intake and increase exercise as tolerated. Needs 7-8 hours of sleep nightly. Avoid trans fats, eat small, frequent meals every 4-5 hours with lean proteins, complex carbs and healthy fats. Minimize simple carbs, GMO foods.  CAD, NATIVE VESSEL Asymptomatic, follows with cardiology  Dyslipidemia Encouraged heart healthy diet, increase exercise, avoid trans fats, consider a krill oil cap daily  Iron deficiency anemia, unspecified resolved

## 2013-11-02 NOTE — Assessment & Plan Note (Signed)
Encouraged DASH diet, decrease po intake and increase exercise as tolerated. Needs 7-8 hours of sleep nightly. Avoid trans fats, eat small, frequent meals every 4-5 hours with lean proteins, complex carbs and healthy fats. Minimize simple carbs, GMO foods. 

## 2013-11-02 NOTE — Assessment & Plan Note (Signed)
resolved 

## 2013-11-16 ENCOUNTER — Other Ambulatory Visit: Payer: Self-pay | Admitting: Family Medicine

## 2013-12-11 ENCOUNTER — Ambulatory Visit: Payer: Medicare Other | Admitting: Podiatrist

## 2013-12-29 ENCOUNTER — Other Ambulatory Visit: Payer: Self-pay | Admitting: Family Medicine

## 2013-12-30 ENCOUNTER — Other Ambulatory Visit: Payer: Self-pay

## 2013-12-30 MED ORDER — AMLODIPINE BESYLATE 10 MG PO TABS: ORAL_TABLET | ORAL | Status: DC

## 2014-01-02 ENCOUNTER — Ambulatory Visit: Payer: Medicare Other | Admitting: Podiatrist

## 2014-01-14 ENCOUNTER — Ambulatory Visit (INDEPENDENT_AMBULATORY_CARE_PROVIDER_SITE_OTHER): Payer: Medicare Other | Admitting: Podiatrist

## 2014-01-14 ENCOUNTER — Encounter: Payer: Self-pay | Admitting: Podiatrist

## 2014-01-14 VITALS — BP 126/89 | HR 78 | Resp 18

## 2014-01-14 DIAGNOSIS — M79609 Pain in unspecified limb: Secondary | ICD-10-CM

## 2014-01-14 DIAGNOSIS — B351 Tinea unguium: Secondary | ICD-10-CM

## 2014-01-19 NOTE — Progress Notes (Signed)
HPI:  Patient presents today for follow up of foot and nail care. Denies any new complaints today.  Objective:  Patients chart is reviewed.  Vascular status reveals pedal pulses noted at 2 out of 4 dp and pt bilateral .  Neurological sensation is Normal to Semmes Weinstein monofilament bilateral.  Patients nails are thickened, discolored, distrophic, friable and brittle with yellow-brown discoloration. Patient subjectively relates they are painful with shoes and with ambulation of bilateral feet.  Assessment:  Symptomatic onychomycosis  Plan:  Discussed treatment options and alternatives.  The symptomatic toenails were debrided through manual an mechanical means without complication.  Return appointment recommended at routine intervals of 3 months    Avriel Kandel, DPM   

## 2014-01-21 ENCOUNTER — Telehealth: Payer: Self-pay | Admitting: Family Medicine

## 2014-01-21 ENCOUNTER — Other Ambulatory Visit: Payer: Self-pay | Admitting: Family

## 2014-01-21 NOTE — Telephone Encounter (Signed)
Spoke with pt's wife and advised her that last refill done by our office was 12/2012, 2 tablets three times daily and to contact cardiology if pt saw them after that date as it appears they did the majority of the refills over the last 2 years.

## 2014-01-21 NOTE — Telephone Encounter (Signed)
Patient wife called in stating that patient is out of potassium. Also, she thinks that patient has been taking the wrong dosage and would like a callback as to how many a day he is supposed to be taking

## 2014-01-28 ENCOUNTER — Other Ambulatory Visit: Payer: Self-pay | Admitting: Family

## 2014-01-29 ENCOUNTER — Ambulatory Visit: Payer: Medicare Other | Admitting: Family Medicine

## 2014-02-25 ENCOUNTER — Other Ambulatory Visit: Payer: Self-pay | Admitting: Family Medicine

## 2014-03-13 ENCOUNTER — Telehealth: Payer: Self-pay | Admitting: Cardiovascular Disease

## 2014-03-13 NOTE — Telephone Encounter (Signed)
Walk in pt Form " Disability Pla-Card" dropped Off gave to [facility]

## 2014-03-19 NOTE — Telephone Encounter (Signed)
Spoke with pt's wife and told her I would leave completed form at front desk for pt to pick up.

## 2014-03-20 ENCOUNTER — Other Ambulatory Visit: Payer: Self-pay

## 2014-03-20 MED ORDER — CARVEDILOL 25 MG PO TABS: ORAL_TABLET | ORAL | Status: DC

## 2014-04-09 ENCOUNTER — Other Ambulatory Visit: Payer: Self-pay | Admitting: Family Medicine

## 2014-04-09 NOTE — Telephone Encounter (Signed)
rx was prescribed by another provider, do you want to refill?

## 2014-04-16 ENCOUNTER — Ambulatory Visit (INDEPENDENT_AMBULATORY_CARE_PROVIDER_SITE_OTHER): Payer: Medicare Other | Admitting: Podiatrist

## 2014-04-16 DIAGNOSIS — B351 Tinea unguium: Secondary | ICD-10-CM | POA: Diagnosis not present

## 2014-04-16 DIAGNOSIS — M79609 Pain in unspecified limb: Secondary | ICD-10-CM

## 2014-04-19 NOTE — Progress Notes (Signed)
HPI: Patient presents today for follow up of foot and nail care. Denies any new complaints today.  Objective: Patients chart is reviewed. Vascular status reveals pedal pulses noted at 2 out of 4 dp and pt bilateral . Neurological sensation is Normal to Semmes Weinstein monofilament bilateral. Patients nails are thickened, discolored, distrophic, friable and brittle with yellow-brown discoloration. Patient subjectively relates they are painful with shoes and with ambulation of bilateral feet.  Assessment: Symptomatic onychomycosis  Plan: Discussed treatment options and alternatives. The symptomatic toenails were debrided through manual an mechanical means without complication. Return appointment recommended at routine intervals of 3 months   

## 2014-04-20 ENCOUNTER — Ambulatory Visit (INDEPENDENT_AMBULATORY_CARE_PROVIDER_SITE_OTHER): Payer: Medicare Other | Admitting: Family Medicine

## 2014-04-20 ENCOUNTER — Encounter: Payer: Self-pay | Admitting: Family Medicine

## 2014-04-20 VITALS — BP 130/90 | HR 77 | Temp 98.0°F | Ht 73.0 in | Wt 343.2 lb

## 2014-04-20 DIAGNOSIS — Z0000 Encounter for general adult medical examination without abnormal findings: Secondary | ICD-10-CM

## 2014-04-20 DIAGNOSIS — E785 Hyperlipidemia, unspecified: Secondary | ICD-10-CM | POA: Diagnosis not present

## 2014-04-20 DIAGNOSIS — Z23 Encounter for immunization: Secondary | ICD-10-CM | POA: Diagnosis not present

## 2014-04-20 DIAGNOSIS — R972 Elevated prostate specific antigen [PSA]: Secondary | ICD-10-CM | POA: Diagnosis not present

## 2014-04-20 DIAGNOSIS — I2510 Atherosclerotic heart disease of native coronary artery without angina pectoris: Secondary | ICD-10-CM

## 2014-04-20 DIAGNOSIS — I10 Essential (primary) hypertension: Secondary | ICD-10-CM

## 2014-04-20 LAB — RENAL FUNCTION PANEL
Albumin: 3.9 g/dL (ref 3.5–5.2)
BUN: 11 mg/dL (ref 6–23)
CHLORIDE: 103 meq/L (ref 96–112)
CO2: 28 meq/L (ref 19–32)
Calcium: 9.4 mg/dL (ref 8.4–10.5)
Creatinine, Ser: 1 mg/dL (ref 0.4–1.5)
GFR: 92.28 mL/min (ref >60.00)
Glucose, Bld: 116 mg/dL — ABNORMAL HIGH (ref 70–99)
POTASSIUM: 3.4 meq/L — AB (ref 3.5–5.1)
Phosphorus: 3.4 mg/dL (ref 2.3–4.6)
Sodium: 144 mEq/L (ref 135–145)

## 2014-04-20 LAB — LIPID PANEL
CHOL/HDL RATIO: 5
Cholesterol: 162 mg/dL (ref 0–200)
HDL: 35.6 mg/dL — AB (ref >39.00)
LDL CALC: 105 mg/dL — AB (ref 0–99)
NonHDL: 126.4
TRIGLYCERIDES: 109 mg/dL (ref 0.0–149.0)
VLDL: 21.8 mg/dL (ref 0.0–40.0)

## 2014-04-20 LAB — CBC
HCT: 42.6 % (ref 39.0–52.0)
Hemoglobin: 14.1 g/dL (ref 13.0–17.0)
MCHC: 33 g/dL (ref 30.0–36.0)
MCV: 90.8 fl (ref 78.0–100.0)
Platelets: 260 10*3/uL (ref 150.0–400.0)
RBC: 4.69 Mil/uL (ref 4.22–5.81)
RDW: 13.5 % (ref 11.5–15.5)
WBC: 8.4 10*3/uL (ref 4.0–10.5)

## 2014-04-20 LAB — HEPATIC FUNCTION PANEL
ALK PHOS: 70 U/L (ref 39–117)
ALT: 17 U/L (ref 0–53)
AST: 20 U/L (ref 0–37)
Albumin: 3.9 g/dL (ref 3.5–5.2)
BILIRUBIN DIRECT: 0.1 mg/dL (ref 0.0–0.3)
Total Bilirubin: 1.1 mg/dL (ref 0.2–1.2)
Total Protein: 7.6 g/dL (ref 6.0–8.3)

## 2014-04-20 LAB — TSH: TSH: 0.45 u[IU]/mL (ref 0.35–4.50)

## 2014-04-20 NOTE — Assessment & Plan Note (Signed)
Well controlled, no changes to meds. Encouraged heart healthy diet such as the DASH diet and exercise as tolerated.  °

## 2014-04-20 NOTE — Progress Notes (Signed)
Pre visit review using our clinic review tool, if applicable. No additional management support is needed unless otherwise documented below in the visit note. 

## 2014-04-20 NOTE — Assessment & Plan Note (Signed)
Encouraged DASH diet, decrease po intake and increase exercise as tolerated. Needs 7-8 hours of sleep nightly. Avoid trans fats, eat small, frequent meals every 4-5 hours with lean proteins, complex carbs and healthy fats. Minimize simple carbs, GMO foods. 

## 2014-04-20 NOTE — Assessment & Plan Note (Addendum)
Sees Dr Carlean Purl [facility] Sees podiatry, Dr Granville Lewis opthamology but cannot remember whom Dr Angelena Form of Cardiology colonoscopy in 2013 Patient denies any difficulties at home. No trouble with ADLs, depression or falls. No recent changes to vision or hearing. Is UTD with immunizations. Is UTD with screening. Discussed Advanced Directives, patient agrees to bring [place] copies of documents if can. Encouraged heart healthy diet, exercise as tolerated and adequate sleep

## 2014-04-20 NOTE — Patient Instructions (Signed)
DASH Eating Plan °DASH stands for "Dietary Approaches to Stop Hypertension." The DASH eating plan is a healthy eating plan that has been shown to reduce high blood pressure (hypertension). Additional health benefits may include reducing the risk of type 2 diabetes mellitus, heart disease, and stroke. The DASH eating plan may also help with weight loss. °WHAT DO I NEED TO KNOW ABOUT THE DASH EATING PLAN? °For the DASH eating plan, you will follow these general guidelines: °· Choose foods with a percent daily value for sodium of less than 5% (as listed on the food label). °· Use salt-free seasonings or herbs instead of table salt or sea salt. °· Check with your health care provider or pharmacist before using salt substitutes. °· Eat lower-sodium products, often labeled as "lower sodium" or "no salt added." °· Eat fresh foods. °· Eat more vegetables, fruits, and low-fat dairy products. °· Choose whole grains. Look for the word "whole" as the first word in the ingredient list. °· Choose fish and skinless chicken or turkey more often than red meat. Limit fish, poultry, and meat to 6 oz (170 g) each day. °· Limit sweets, desserts, sugars, and sugary drinks. °· Choose heart-healthy fats. °· Limit cheese to 1 oz (28 g) per day. °· Eat more home-cooked food and less restaurant, buffet, and fast food. °· Limit fried foods. °· Cook foods using methods other than frying. °· Limit canned vegetables. If you do use them, rinse them well to decrease the sodium. °· When eating at a restaurant, ask that your food be prepared with less salt, or no salt if possible. °WHAT FOODS CAN I EAT? °Seek help from a dietitian for individual calorie needs. °Grains °Whole grain or whole wheat bread. Brown rice. Whole grain or whole wheat pasta. Quinoa, bulgur, and whole grain cereals. Low-sodium cereals. Corn or whole wheat flour tortillas. Whole grain cornbread. Whole grain crackers. Low-sodium crackers. °Vegetables °Fresh or frozen vegetables  (raw, steamed, roasted, or grilled). Low-sodium or reduced-sodium tomato and vegetable juices. Low-sodium or reduced-sodium tomato sauce and paste. Low-sodium or reduced-sodium canned vegetables.  °Fruits °All fresh, canned (in natural juice), or frozen fruits. °Meat and Other Protein Products °Ground beef (85% or leaner), grass-fed beef, or beef trimmed of fat. Skinless chicken or turkey. Ground chicken or turkey. Pork trimmed of fat. All fish and seafood. Eggs. Dried beans, peas, or lentils. Unsalted nuts and seeds. Unsalted canned beans. °Dairy °Low-fat dairy products, such as skim or 1% milk, 2% or reduced-fat cheeses, low-fat ricotta or cottage cheese, or plain low-fat yogurt. Low-sodium or reduced-sodium cheeses. °Fats and Oils °Tub margarines without trans fats. Light or reduced-fat mayonnaise and salad dressings (reduced sodium). Avocado. Safflower, olive, or canola oils. Natural peanut or almond butter. °Other °Unsalted popcorn and pretzels. °The items listed above may not be a complete list of recommended foods or beverages. Contact your dietitian for more options. °WHAT FOODS ARE NOT RECOMMENDED? °Grains °White bread. White pasta. White rice. Refined cornbread. Bagels and croissants. Crackers that contain trans fat. °Vegetables °Creamed or fried vegetables. Vegetables in a cheese sauce. Regular canned vegetables. Regular canned tomato sauce and paste. Regular tomato and vegetable juices. °Fruits °Dried fruits. Canned fruit in light or heavy syrup. Fruit juice. °Meat and Other Protein Products °Fatty cuts of meat. Ribs, chicken wings, bacon, sausage, bologna, salami, chitterlings, fatback, hot dogs, bratwurst, and packaged luncheon meats. Salted nuts and seeds. Canned beans with salt. °Dairy °Whole or 2% milk, cream, half-and-half, and cream cheese. Whole-fat or sweetened yogurt. Full-fat   cheeses or blue cheese. Nondairy creamers and whipped toppings. Processed cheese, cheese spreads, or cheese  curds. °Condiments °Onion and garlic salt, seasoned salt, table salt, and sea salt. Canned and packaged gravies. Worcestershire sauce. Tartar sauce. Barbecue sauce. Teriyaki sauce. Soy sauce, including reduced sodium. Steak sauce. Fish sauce. Oyster sauce. Cocktail sauce. Horseradish. Ketchup and mustard. Meat flavorings and tenderizers. Bouillon cubes. Hot sauce. Tabasco sauce. Marinades. Taco seasonings. Relishes. °Fats and Oils °Butter, stick margarine, lard, shortening, ghee, and bacon fat. Coconut, palm kernel, or palm oils. Regular salad dressings. °Other °Pickles and olives. Salted popcorn and pretzels. °The items listed above may not be a complete list of foods and beverages to avoid. Contact your dietitian for more information. °WHERE CAN I FIND MORE INFORMATION? °National Heart, [employer], and [employer] Institute: www.nhlbi.nih.gov/health/health-topics/topics/dash/ °Document Released: 07/27/2011 Document Revised: 12/22/2013 Document Reviewed: 06/11/2013 °Exit[employer]® Patient Information ©2015 [employer]. This information is not intended to replace advice given to you by your health care provider. Make sure you discuss any questions you have with your health care provider. ° °

## 2014-04-20 NOTE — Assessment & Plan Note (Signed)
Follows with Cardiology, Dr Angelena Form, doing well

## 2014-04-20 NOTE — Progress Notes (Signed)
Patient ID: David Parsons, male   DOB: 29-May-1941, 73 y.o.   MRN: [id number] David Parsons [id number] 09-08-1940 04/20/2014      Progress Note-Follow Up  Subjective  Chief Complaint  Chief Complaint  Patient presents with  . Annual Exam    medicare wellness  . Injections    flu    HPI  Patient is a 73 year old male in today for routine medical care. Is in today for annual exam. He feels well. He is to show with this week and admits he has not been exercising or following his DASH diet regularly. After his last visit because of the DASH diet I will simply stopped recently for no apparent otherwise he feels well. He falls with podiatry 3 months for his onychomycosis and well. He denies any recent illness or visual changes. 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  Past Medical History  Diagnosis Date  . Hypertension   . Aortic insufficiency      02/16/09 Chest CT:  The aortic root has a diameter measuring 4.6 cm, image 64 of the coronal series.  The ascending    thoracic aorta has a diameter of 3.8 cm, image 65 of the coronal series.  At the level of the aortic arch    the thoracic aorta has a maximum diameter of 3.2 cm.  The descending thoracic aorta has a maximum           diameter of 3.2 cm.  There is no evidence for aortic disse        . GERD (gastroesophageal reflux disease)   . Hypokalemia   . Helicobacter pylori gastritis     (Tx Pylera 4/10)  . Cardiomyopathy      01/2009 - Cath - nonobs. dzs.  EF 35%.  . Gallstones     s/p cholecystectomy    . Sleep apnea     occasionally wears CPAP at night  . CAD (coronary artery disease)   . Aortic root enlargement     The aortic root has a diameter measuring 4.6 cm, image 64 of the coronal series.  The ascending  thoracic aorta has a diameter of 3.8 cm, image 65 of the coronal series.  At the level of the aortic arch   the thoracic aorta has a maximum diameter of 3.2 cm.  The descending thoracic  aorta has a maximum   diameter of 3.2 cm.  There is no evidence for aortic dissection.       . Iron deficiency anemia   . CHF (congestive heart failure)   . Obesity, Class III, BMI 40-49.9 (morbid obesity)   . Heart murmur   . PONV (postoperative nausea and vomiting)   . 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 Diabetes mellitus   . Dyslipidemia 01/14/2013    Past Surgical History  Procedure Laterality Date  . Cholecystectomy    . Adrenalectomy    . Colonoscopy w/ biopsies and polypectomy  11/04/2008    colon polyps, diverticulosis   . Upper gastrointestinal endoscopy  11/04/2008    w/bx, H pylori gastritis  . Upper gastrointestinal endoscopy  09/25/2011  . Colonoscopy  09/25/2011  . Multiple tooth extractions    .  Tee without cardioversion  11/22/2011    Procedure: TRANSESOPHAGEAL ECHOCARDIOGRAM (TEE);  Surgeon: Larey Dresser, MD;  Location: [place];  Service: Cardiovascular;  Laterality: N/A;  . Cardiac catheterization      2013  . Cardiac valve replacement  12/28/2011    Bentall with tissue valve  . Coronary artery bypass graft  12/28/2011    Procedure: CORONARY ARTERY BYPASS GRAFTING (CABG);  Surgeon: Rexene Alberts, MD;  Location: [place];  Service: Open Heart Surgery;  Laterality: N/A;  cabg x 1  . Bentall procedure  12/28/2011    Procedure: BENTALL PROCEDURE;  Surgeon: Rexene Alberts, MD;  Location: [place];  Service: Open Heart Surgery;  Laterality: N/A;    Family History  Problem Relation Age of Onset  . Breast cancer Sister   . Colon cancer Brother 70    at least 53  . Prostate cancer Brother   . Diabetes Sister     5 siblings  . Stomach cancer Sister   . Hypertension Mother   . Kidney failure Sister   . Colon cancer  Sister   . Breast cancer Sister   . Stroke Father     History   Social History  . Marital Status: Married    Spouse Name: [name]    Number of Children: 0  . Years of Education: N/A   Occupational History  .      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 Smoking status: Never Smoker   . Smokeless tobacco: Never Used  . Alcohol Use: No  . Drug Use: No  . Sexual Activity: Not on file   Other Topics Concern  . Not on file   Social History Narrative   The patient is married, lives with his wife in  [place].  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  He  lives a very sedentary lifestyle.  He is a nonsmoker.  He denies alcohol consumption.     Current Outpatient Prescriptions on File Prior to Visit  Medication Sig Dispense Refill  . amLODipine (NORVASC) 10 MG tablet Take 1 tablet by mouth   daily  90 tablet  1  . aspirin EC 81 MG tablet Take 81 mg by mouth daily.      . carvedilol (COREG) 25 MG tablet take 1 tablet by mouth twice a day with meals  30 tablet  0  . ferrous sulfate 325 (65 FE) MG tablet Take 325 mg by mouth 2 (two) times daily before a meal.      . furosemide (LASIX) 40 MG tablet Take 2 tablets every morning and 1 tablet every afternoon  90 tablet  6  . hydrochlorothiazide (HYDRODIURIL) 25 MG tablet take 1 tablet by mouth once daily  30 tablet  2  . Multiple Vitamin (MULITIVITAMIN WITH MINERALS) TABS Take 1 tablet by mouth daily.      Marland Kitchen omeprazole (PRILOSEC) 40 MG capsule take 1 capsule by mouth once daily  30 capsule  2  . potassium chloride SA (K-DUR,KLOR-CON) 20 MEQ tablet take 2 tablets by mouth three times a day  540 tablet  0  . telmisartan (MICARDIS) 80 MG tablet take 1 tablet by mouth once daily  30 tablet  2   No current facility-administered medications on file prior to visit.    Allergies  Allergen Reactions  . Latex Hives    Latex gloves     Review of Systems  Review  of Systems  Constitutional:  Negative for fever, chills and malaise/fatigue.  HENT: Negative for congestion, hearing loss and nosebleeds.   Eyes: Negative for discharge.  Respiratory: Negative for cough, sputum production, shortness of breath and wheezing.   Cardiovascular: Negative for chest pain, palpitations and leg swelling.  Gastrointestinal: Negative for heartburn, nausea, vomiting, abdominal pain, diarrhea, constipation and blood in stool.  Genitourinary: Negative for dysuria, urgency, frequency and hematuria.  Musculoskeletal: Negative for back pain, falls and myalgias.  Skin: Negative for rash.  Neurological: Negative for dizziness, tremors, sensory change, focal weakness, loss of consciousness, weakness and headaches.  Endo/Heme/Allergies: Negative for polydipsia. Does not bruise/bleed easily.  Psychiatric/Behavioral: Negative for depression and suicidal ideas. The patient is not nervous/anxious and does not have insomnia.     Objective  BP 130/90  Pulse 77  Temp(Src) 98 F (36.7 C) (Oral)  Ht 6\' 1"  (1.854 m)  Wt 343 lb 3.2 oz (155.674 kg)  BMI 45.29 kg/m2  SpO2 94%  Physical Exam  Physical Exam  Constitutional: He is oriented to person, place, and time and well-developed, well-nourished, and in no distress. No distress.  obese  HENT:  Head: Normocephalic and atraumatic.  Eyes: Conjunctivae are normal.  Neck: Neck supple. No thyromegaly present.  Cardiovascular: Normal rate and regular rhythm.   Murmur heard. 2-3/6 sys M  Pulmonary/Chest: Effort normal and breath sounds normal. No respiratory distress.  Abdominal: Soft. Bowel sounds are normal. He exhibits no distension and no mass. There is no tenderness.  Musculoskeletal: He exhibits no edema.  Neurological: He is alert and oriented to person, place, and time.  Skin: Skin is warm.  Psychiatric: Memory, affect and judgment normal.    Lab Results  Component Value Date   TSH 1.379 01/14/2013   Lab Results  Component Value Date   WBC 7.4  01/14/2013   HGB 14.0 01/14/2013   HCT 39.9 01/14/2013   MCV 84.0 01/14/2013   PLT 286 01/14/2013   Lab Results  Component Value Date   CREATININE 0.94 06/19/2013   BUN 14 06/19/2013   NA 141 06/19/2013   K 4.1 06/19/2013   CL 108 06/19/2013   CO2 26 06/19/2013   Lab Results  Component Value Date   ALT 12 06/19/2013   AST 15 06/19/2013   ALKPHOS 65 06/19/2013   BILITOT 0.7 06/19/2013   Lab Results  Component Value Date   CHOL 156 01/14/2013   Lab Results  Component Value Date   HDL 36* 01/14/2013   Lab Results  Component Value Date   LDLCALC 95 01/14/2013   Lab Results  Component Value Date   TRIG 124 01/14/2013   Lab Results  Component Value Date   CHOLHDL 4.3 01/14/2013     Assessment & Plan   Medicare annual wellness visit, subsequent Sees Dr Carlean Purl of Gastroenterology [facility] Sees podiatry, Dr Granville Lewis opthamology but cannot remember whom Dr Angelena Form of Cardiology colonoscopy in 2013 Patient denies any difficulties at home. No trouble with ADLs, depression or falls. No recent changes to vision or hearing. Is UTD with immunizations. Is UTD with screening. Discussed Advanced Directives, patient agrees to bring [place] copies of documents if can. Encouraged heart healthy diet, exercise as tolerated and adequate sleep  HYPERTENSION Well controlled, no changes to meds. Encouraged heart healthy diet such as the DASH diet and exercise as tolerated.   Obesity, Class III, BMI 40-49.9 (morbid obesity) Encouraged DASH diet, decrease po intake and increase exercise as tolerated. Needs 7-8  hours of sleep nightly. Avoid trans fats, eat small, frequent meals every 4-5 hours with lean proteins, complex carbs and healthy fats. Minimize simple carbs, GMO foods.  CAD, NATIVE VESSEL Follows with Cardiology, Dr Angelena Form, doing well  PSA, INCREASED Follows with [facility]  Dyslipidemia Encouraged heart healthy diet, increase exercise, avoid trans fats,  consider a krill oil cap daily. Check lipids today

## 2014-04-20 NOTE — Assessment & Plan Note (Signed)
Follows with [facility]

## 2014-04-20 NOTE — Assessment & Plan Note (Signed)
Encouraged heart healthy diet, increase exercise, avoid trans fats, consider a krill oil cap daily. Check lipids today 

## 2014-04-21 ENCOUNTER — Ambulatory Visit: Payer: Medicare Other

## 2014-04-21 ENCOUNTER — Telehealth: Payer: Self-pay

## 2014-04-21 DIAGNOSIS — R7309 Other abnormal glucose: Secondary | ICD-10-CM

## 2014-04-21 LAB — HEMOGLOBIN A1C: HEMOGLOBIN A1C: 5.6 % (ref 4.6–6.5)

## 2014-04-21 NOTE — Telephone Encounter (Signed)
A1C lab add on sent to [facility] lab

## 2014-04-24 ENCOUNTER — Other Ambulatory Visit: Payer: Self-pay | Admitting: Family Medicine

## 2014-05-14 ENCOUNTER — Other Ambulatory Visit: Payer: Self-pay | Admitting: Family Medicine

## 2014-05-27 ENCOUNTER — Other Ambulatory Visit: Payer: Self-pay | Admitting: Family Medicine

## 2014-05-27 NOTE — Telephone Encounter (Signed)
Rx request to pharmacy/SLS  

## 2014-06-03 ENCOUNTER — Other Ambulatory Visit: Payer: Self-pay | Admitting: Family Medicine

## 2014-06-11 ENCOUNTER — Ambulatory Visit (INDEPENDENT_AMBULATORY_CARE_PROVIDER_SITE_OTHER): Payer: Medicare Other | Admitting: Cardiovascular Disease

## 2014-06-11 ENCOUNTER — Encounter: Payer: Self-pay | Admitting: Cardiovascular Disease

## 2014-06-11 VITALS — BP 130/88 | HR 77 | Ht 73.0 in | Wt 344.4 lb

## 2014-06-11 DIAGNOSIS — I350 Nonrheumatic aortic (valve) stenosis: Secondary | ICD-10-CM

## 2014-06-11 DIAGNOSIS — I10 Essential (primary) hypertension: Secondary | ICD-10-CM

## 2014-06-11 DIAGNOSIS — Z954 Presence of other heart-valve replacement: Secondary | ICD-10-CM

## 2014-06-11 DIAGNOSIS — I5033 Acute on chronic diastolic (congestive) heart failure: Secondary | ICD-10-CM | POA: Diagnosis not present

## 2014-06-11 DIAGNOSIS — Z952 Presence of prosthetic heart valve: Secondary | ICD-10-CM

## 2014-06-11 DIAGNOSIS — I2510 Atherosclerotic heart disease of native coronary artery without angina pectoris: Secondary | ICD-10-CM

## 2014-06-11 LAB — BASIC METABOLIC PANEL
BUN: 14 mg/dL (ref 6–23)
CALCIUM: 9.1 mg/dL (ref 8.4–10.5)
CO2: 25 mEq/L (ref 19–32)
CREATININE: 1.1 mg/dL (ref 0.4–1.5)
Chloride: 101 mEq/L (ref 96–112)
GFR: 89.21 mL/min (ref >60.00)
Glucose, Bld: 121 mg/dL — ABNORMAL HIGH (ref 70–99)
Potassium: 3.6 mEq/L (ref 3.5–5.1)
Sodium: 140 mEq/L (ref 135–145)

## 2014-06-11 MED ORDER — FUROSEMIDE 40 MG PO TABS: 40.0000 mg | ORAL_TABLET | Freq: Two times a day (BID) | ORAL | Status: DC

## 2014-06-11 NOTE — Patient Instructions (Signed)
Your physician wants you to follow-up in:  6 months.  You will receive a reminder letter in the mail two months in advance. If you don't receive a letter, please call our office to schedule the follow-up appointment.  Your physician has recommended you make the following change in your medication:  Change furosemide to 40 mg by mouth twice daily

## 2014-06-11 NOTE — Progress Notes (Signed)
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 He was referred in May 2010 for SOB, abdominal swelling and LE edema. He was diagnosed with a non-ischemic CM, moderate AI, non-obstructive CAD and a dilated aortic root. He underwent a right and left heart cath June 2010 with non-obstructive coronary artery disease. A CT angiogram was performed in June 2010 to look closely at his aortic root and showed moderate dilation of the aortic root (root 4.6 cm, ascending aorta 3.8cm). Echo in the spring of 2011 showed enlarging aortic root. I planned on performing an MRI to look at his valve, degree of AI and size of root but he was too large for the scanner. His aortic root continued to enlarge. I saw him 11/13/11 and arranged a cardiac cath on 11/22/11 which showed enlarged root, no obstructive disease in the left system. I could not engage the RCA secondary to the abnormal takeoff with the enlarged aortic root. CTA was performed and showed dilated aortic root with patent RCA. He underwent aortic root replacement, aortic valve replacement and SVG to RCA on 12/28/11. BP has been elevated. Started on Micardis 05/14/13 in primary care. I saw him 05/20/13 and he was up 36 lbs. Lasix was increased.   He is here today for follow up. No chest pain or SOB. No near syncope or syncope. He feels great. Mild LE edema.   Primary Care Physician: Daniel Nones  Last Lipid Profile:Lipid Panel     Component Value Date/Time   CHOL 162 04/20/2014 0954   TRIG 109.0 04/20/2014 0954   HDL 35.60* 04/20/2014 0954   CHOLHDL 5 04/20/2014 0954   VLDL 21.8 04/20/2014 0954   LDLCALC 105* 04/20/2014 0954     Past Medical History  Diagnosis Date  . Hypertension   . Aortic insufficiency    02/16/09 Chest CT:  The aortic root has a diameter measuring 4.6 cm, image 64 of the coronal series.  The ascending    thoracic aorta has a diameter of 3.8 cm, image 65 of the coronal series.  At the level of the aortic arch    the thoracic aorta has a maximum diameter of 3.2 cm.  The descending thoracic aorta has a maximum           diameter of 3.2 cm.  There is no evidence for aortic disse        . GERD (gastroesophageal reflux disease)   . Hypokalemia   . Helicobacter pylori gastritis     (Tx Pylera 4/10)  . Cardiomyopathy      01/2009 - Cath - nonobs. dzs.  EF 35%.  . Gallstones     s/p cholecystectomy    . Sleep apnea     occasionally wears CPAP at night  . CAD (coronary artery disease)   . Aortic root enlargement     The aortic root has a diameter measuring 4.6 cm, image 64 of the coronal series.  The ascending  thoracic aorta has a diameter of 3.8 cm, image 65 of the coronal series.  At the level of the aortic arch   the thoracic aorta has a maximum diameter of 3.2 cm.  The descending thoracic aorta has a maximum   diameter of 3.2 cm.  There is  no evidence for aortic dissection.       . Iron deficiency anemia   . CHF (congestive heart failure)   . Obesity, Class III, BMI 40-49.9 (morbid obesity)   . Heart murmur   . PONV (postoperative nausea and vomiting)   . 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 Diabetes mellitus   . Dyslipidemia 01/14/2013    Past Surgical History  Procedure Laterality Date  . Cholecystectomy    . Adrenalectomy    . Colonoscopy w/ biopsies and polypectomy  11/04/2008    colon polyps, diverticulosis   . Upper gastrointestinal endoscopy  11/04/2008    w/bx, H pylori gastritis  . Upper gastrointestinal endoscopy   09/25/2011  . Colonoscopy  09/25/2011  . Multiple tooth extractions    . Tee without cardioversion  11/22/2011    Procedure: TRANSESOPHAGEAL ECHOCARDIOGRAM (TEE);  Surgeon: Larey Dresser, MD;  Location: [place];  Service: Cardiovascular;  Laterality: N/A;  . Cardiac catheterization      2013  . Cardiac valve replacement  12/28/2011    Bentall with tissue valve  . Coronary artery bypass graft  12/28/2011    Procedure: CORONARY ARTERY BYPASS GRAFTING (CABG);  Surgeon: Rexene Alberts, MD;  Location: [place];  Service: Open Heart Surgery;  Laterality: N/A;  cabg x 1  . Bentall procedure  12/28/2011    Procedure: BENTALL PROCEDURE;  Surgeon: Rexene Alberts, MD;  Location: [place];  Service: Open Heart Surgery;  Laterality: N/A;    Current Outpatient Prescriptions  Medication Sig Dispense Refill  . amLODipine (NORVASC) 10 MG tablet Take 1 tablet by mouth   daily  90 tablet  1  . aspirin EC 81 MG tablet Take 81 mg by mouth daily.      . carvedilol (COREG) 25 MG tablet take 1 tablet by mouth twice a day with meals  60 tablet  2  . ferrous sulfate 325 (65 FE) MG tablet Take 325 mg by mouth 2 (two) times daily before a meal.      . furosemide (LASIX) 40 MG tablet Take 2 tablets every morning and 1 tablet every afternoon  90 tablet  6  . hydrochlorothiazide (HYDRODIURIL) 25 MG tablet take 1 tablet by mouth once daily  30 tablet  2  . Multiple Vitamin (MULITIVITAMIN WITH MINERALS) TABS Take 1 tablet by mouth daily.      Marland Kitchen omeprazole (PRILOSEC) 40 MG capsule take 1 capsule by mouth once daily  30 capsule  2  . potassium chloride SA (K-DUR,KLOR-CON) 20 MEQ tablet take 2 tablets by mouth three times a day  540 tablet  0  . telmisartan (MICARDIS) 80 MG tablet take 1 tablet by mouth once daily  30 tablet  2   No current facility-administered medications for this visit.    Allergies  Allergen Reactions  . Latex Hives    Latex gloves     History   Social History  . Marital Status: Married    Spouse  Name: [name]    Number of Children: 0  . Years of Education: N/A   Occupational History  .      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 Smoking status: Never Smoker   .  Smokeless tobacco: Never Used  . Alcohol Use: No  . Drug Use: No  . Sexual Activity: Not on file     Comment: lives with wife, pastros 2 churches   Other Topics Concern  . Not on file   Social History Narrative   The patient is married, lives with his wife in  [place].  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  He  lives a very sedentary lifestyle.  He is a nonsmoker.  He denies alcohol consumption.     Family History  Problem Relation Age of Onset  . Breast cancer Sister   . Diabetes Sister   . Colon cancer Brother 70    at least 35  . Diabetes Brother   . Cancer Brother     colon, prostate  . Prostate cancer Brother   . Diabetes Brother   . Diabetes Sister   . Cancer Sister     breast, colon, melanoma  . Hypertension Mother   . Arthritis Mother   . Kidney failure Sister   . Colon cancer Sister   . Breast cancer Sister   . Kidney disease Sister     s/p transplant  . Hypertension Sister   . Diabetes Sister   . Stroke Father   . Hypertension Father     Review of Systems:  As stated in the HPI and otherwise negative.   BP 130/88  Pulse 77  Ht 6\' 1"  (1.854 m)  Wt 344 lb 6.4 oz (156.219 kg)  BMI 45.45 kg/m2  Physical Examination: General: Well developed, well nourished, NAD HEENT: OP clear, mucus membranes moist SKIN: warm, dry. No rashes. Neuro: No focal deficits Musculoskeletal: Muscle strength 5/5 all ext Psychiatric: Mood and affect normal Neck: No JVD, no carotid bruits, no thyromegaly, no lymphadenopathy. Lungs:Clear bilaterally, no wheezes, rhonci, crackles Cardiovascular: Regular rate and rhythm. Systolic murmur. No gallops or rubs. Abdomen:Soft. Bowel sounds present. Non-tender.  Extremities: Trace bilateral lower extremity edema.  Pulses are 2 + in the bilateral DP/PT.  EKG: Sinus, rate 70 bpm. 1st degree AV block. RBBB  Echo 04/02/12: Left ventricle: The cavity size was normal. There was severe concentric hypertrophy. The estimated ejection fraction was 55%. - Aortic valve: Normal appearing tissue AVR - Left atrium: The atrium was mildly dilated. - Atrial septum: Poorly visualized Cannot R/O PFO - Pericardium, extracardiac: Small mostly posterior pericardial effusion  Assessment and Plan:   1. CAD, NATIVE VESSEL: S/p single vessel bypass to RCA. Stable. Continue ASA and beta blocker.   2. Acute on chronic diastolic CHF NYHA class 1: Weight is stable over last 6 months. Mild LE edema. Will increase Lasix to 40 mg BID, he has only been taking it once daily. Check BMET today.   3. HYPERTENSION: BP controlled today. Managed in primary care. Will continue Coreg, Norvasc and Micardis/HCT.   4. S/P aortic valve replacement and aortoplasty: Stable. LV function normal by echo August 2013. AVR working well.

## 2014-07-03 ENCOUNTER — Ambulatory Visit: Payer: Medicare Other | Admitting: Podiatrist

## 2014-07-20 ENCOUNTER — Ambulatory Visit: Payer: Medicare Other | Admitting: Family Medicine

## 2014-07-21 ENCOUNTER — Ambulatory Visit (INDEPENDENT_AMBULATORY_CARE_PROVIDER_SITE_OTHER): Payer: Medicare Other | Admitting: Family Medicine

## 2014-07-21 ENCOUNTER — Encounter: Payer: Self-pay | Admitting: Family Medicine

## 2014-07-21 VITALS — BP 134/78 | HR 80 | Temp 97.5°F | Ht 73.0 in | Wt 344.4 lb

## 2014-07-21 DIAGNOSIS — E876 Hypokalemia: Secondary | ICD-10-CM

## 2014-07-21 DIAGNOSIS — I2510 Atherosclerotic heart disease of native coronary artery without angina pectoris: Secondary | ICD-10-CM | POA: Diagnosis not present

## 2014-07-21 DIAGNOSIS — Z23 Encounter for immunization: Secondary | ICD-10-CM | POA: Diagnosis not present

## 2014-07-21 DIAGNOSIS — I10 Essential (primary) hypertension: Secondary | ICD-10-CM | POA: Diagnosis not present

## 2014-07-21 DIAGNOSIS — R739 Hyperglycemia, unspecified: Secondary | ICD-10-CM | POA: Diagnosis not present

## 2014-07-21 DIAGNOSIS — I509 Heart failure, unspecified: Secondary | ICD-10-CM

## 2014-07-21 DIAGNOSIS — E785 Hyperlipidemia, unspecified: Secondary | ICD-10-CM

## 2014-07-21 LAB — TSH: TSH: 1.11 u[IU]/mL (ref 0.35–4.50)

## 2014-07-21 LAB — CBC
HCT: 42.1 % (ref 39.0–52.0)
HEMOGLOBIN: 13.9 g/dL (ref 13.0–17.0)
MCHC: 33 g/dL (ref 30.0–36.0)
MCV: 89.9 fl (ref 78.0–100.0)
Platelets: 257 10*3/uL (ref 150.0–400.0)
RBC: 4.68 Mil/uL (ref 4.22–5.81)
RDW: 13.6 % (ref 11.5–15.5)
WBC: 9 10*3/uL (ref 4.0–10.5)

## 2014-07-21 LAB — HEMOGLOBIN A1C: HEMOGLOBIN A1C: 5.8 % (ref 4.6–6.5)

## 2014-07-21 NOTE — Progress Notes (Signed)
David Parsons  [id number] [birth year] 07/21/2014      Progress Note-Follow Up  Subjective  Chief Complaint  Chief Complaint  Patient presents with  . Follow-up    HPI  Patient is a 73 y.o. male in today for routine medical care. He is doing well today. No recent illness. No acute concerns. Is not following a diabetic diet. Denies CP/palp/SOB/HA/congestion/fevers/GI or GU c/o. Taking meds as prescribed  Past Medical History  Diagnosis Date  . Hypertension   . Aortic insufficiency      02/16/09 Chest CT:  The aortic root has a diameter measuring 4.6 cm, image 64 of the coronal series.  The ascending    thoracic aorta has a diameter of 3.8 cm, image 65 of the coronal series.  At the level of the aortic arch    the thoracic aorta has a maximum diameter of 3.2 cm.  The descending thoracic aorta has a maximum           diameter of 3.2 cm.  There is no evidence for aortic disse        . GERD (gastroesophageal reflux disease)   . Hypokalemia   . Helicobacter pylori gastritis     (Tx Pylera 4/10)  . Cardiomyopathy      01/2009 - Cath - nonobs. dzs.  EF 35%.  . Gallstones     s/p cholecystectomy    . Sleep apnea     occasionally wears CPAP at night  . CAD (coronary artery disease)   . Aortic root enlargement     The aortic root has a diameter measuring 4.6 cm, image 64 of the coronal series.  The ascending  thoracic aorta has a diameter of 3.8 cm, image 65 of the coronal series.  At the level of the aortic arch   the thoracic aorta has a maximum diameter of 3.2 cm.  The descending thoracic aorta has a maximum   diameter of 3.2 cm.  There is no evidence for aortic dissection.       . Iron deficiency anemia   . CHF (congestive heart failure)   . Obesity, Class III, BMI 40-49.9 (morbid obesity)   . Heart murmur   . PONV (postoperative nausea and vomiting)   . 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 Diabetes mellitus   . Dyslipidemia 01/14/2013    Past Surgical History  Procedure Laterality Date  . Cholecystectomy    . Adrenalectomy    . Colonoscopy w/ biopsies and polypectomy  11/04/2008    colon polyps, diverticulosis   . Upper gastrointestinal endoscopy  11/04/2008    w/bx, H pylori gastritis  . Upper gastrointestinal endoscopy  09/25/2011  . Colonoscopy  09/25/2011  . Multiple tooth extractions    . Tee without cardioversion  11/22/2011    Procedure: TRANSESOPHAGEAL ECHOCARDIOGRAM (TEE);  Surgeon: Larey Dresser, MD;  Location: [place] [facility];  Service: Cardiovascular;  Laterality: N/A;  . Cardiac catheterization      2013  . Cardiac valve replacement  12/28/2011    Bentall with tissue valve  . Coronary artery bypass graft  12/28/2011    Procedure: CORONARY ARTERY BYPASS GRAFTING (CABG);  Surgeon: Rexene Alberts, MD;  Location: [place];  Service: Open Heart Surgery;  Laterality: N/A;  cabg x 1  . Bentall procedure  12/28/2011    Procedure: BENTALL PROCEDURE;  Surgeon: Rexene Alberts, MD;  Location: [place];  Service: Open Heart Surgery;  Laterality: N/A;    Family History  Problem Relation Age of Onset  . Breast cancer Sister   . Diabetes Sister   . Colon cancer Brother 70    at least 52  . Diabetes Brother   . Cancer Brother     colon, prostate  . Prostate cancer Brother   . Diabetes Brother   . Diabetes Sister   . Cancer Sister     breast, colon, melanoma  . Hypertension Mother   . Arthritis Mother   . Kidney failure Sister   . Colon cancer Sister   . Breast cancer Sister   . Kidney disease Sister     s/p transplant  . Hypertension Sister   . Diabetes Sister   . Stroke Father   . Hypertension Father     History   Social History  . Marital Status: Married    Spouse Name: [name]    Number of Children: 0    . Years of Education: N/A   Occupational History  .      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 Smoking status: Never Smoker   . Smokeless tobacco: Never Used  . Alcohol Use: No  . Drug Use: No  . Sexual Activity: Not on file     Comment: lives with wife, pastros 2 churches   Other Topics Concern  . Not on file   Social History Narrative   The patient is married, lives with his wife in  [place].  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  He  lives a very sedentary lifestyle.  He is a nonsmoker.  He denies alcohol consumption.     Current Outpatient Prescriptions on File Prior to Visit  Medication Sig Dispense Refill  . amLODipine (NORVASC) 10 MG tablet Take 1 tablet by mouth   daily 90 tablet 1  . aspirin EC 81 MG tablet Take 81 mg by mouth daily.    . carvedilol (COREG) 25 MG tablet take 1 tablet by mouth twice a day with meals 60 tablet 2  . ferrous sulfate 325 (65 FE) MG tablet Take 325 mg by mouth 2 (two) times daily before a meal.    . furosemide (LASIX) 40 MG tablet Take 1 tablet (40 mg total) by mouth 2 (two) times daily. 180 tablet 3  . hydrochlorothiazide (HYDRODIURIL) 25 MG tablet take 1 tablet by mouth once daily 30 tablet 2  . Multiple Vitamin (MULITIVITAMIN WITH MINERALS) TABS Take 1 tablet by mouth daily.    Marland Kitchen omeprazole (PRILOSEC) 40 MG capsule take 1 capsule by mouth once daily 30 capsule 2  . potassium chloride SA (K-DUR,KLOR-CON) 20 MEQ tablet take 2 tablets by mouth three times a day 540 tablet 0  . telmisartan (MICARDIS) 80 MG tablet take 1 tablet by mouth once daily 30 tablet 2   No current facility-administered medications on file prior to visit.    Allergies  Allergen Reactions  . Latex Hives    Latex gloves     Review of Systems  Review of Systems  Constitutional: Negative for fever and malaise/fatigue.  HENT: Negative for congestion.   Eyes: Negative for discharge.  Respiratory: Negative for  shortness of breath.   Cardiovascular: Negative for chest pain,  palpitations and leg swelling.  Gastrointestinal: Negative for nausea, abdominal pain and diarrhea.  Genitourinary: Negative for dysuria.  Musculoskeletal: Negative for falls.  Skin: Negative for rash.  Neurological: Negative for loss of consciousness and headaches.  Endo/Heme/Allergies: Negative for polydipsia.  Psychiatric/Behavioral: Negative for depression and suicidal ideas. The patient is not nervous/anxious and does not have insomnia.     Objective  BP 130/84 mmHg  Pulse 80  Temp(Src) 97.5 F (36.4 C) (Oral)  Ht 6\' 1"  (1.854 m)  Wt 344 lb 6.4 oz (156.219 kg)  BMI 45.45 kg/m2  SpO2 90%  Physical Exam  Physical Exam  Constitutional: He is oriented to person, place, and time and well-developed, well-nourished, and in no distress. No distress.  HENT:  Head: Normocephalic and atraumatic.  Eyes: Conjunctivae are normal.  Neck: Neck supple. No thyromegaly present.  Cardiovascular: Normal rate, regular rhythm and normal heart sounds.   No murmur heard. Pulmonary/Chest: Effort normal and breath sounds normal. No respiratory distress.  Abdominal: He exhibits no distension and no mass. There is no tenderness.  Musculoskeletal: He exhibits no edema.  Neurological: He is alert and oriented to person, place, and time.  Skin: Skin is warm.  Psychiatric: Memory, affect and judgment normal.    Lab Results  Component Value Date   TSH 0.45 04/20/2014   Lab Results  Component Value Date   WBC 8.4 04/20/2014   HGB 14.1 04/20/2014   HCT 42.6 04/20/2014   MCV 90.8 04/20/2014   PLT 260.0 04/20/2014   Lab Results  Component Value Date   CREATININE 1.1 06/11/2014   BUN 14 06/11/2014   NA 140 06/11/2014   K 3.6 06/11/2014   CL 101 06/11/2014   CO2 25 06/11/2014   Lab Results  Component Value Date   ALT 17 04/20/2014   AST 20 04/20/2014   ALKPHOS 70 04/20/2014   BILITOT 1.1 04/20/2014   Lab Results    Component Value Date   CHOL 162 04/20/2014   Lab Results  Component Value Date   HDL 35.60* 04/20/2014   Lab Results  Component Value Date   LDLCALC 105* 04/20/2014   Lab Results  Component Value Date   TRIG 109.0 04/20/2014   Lab Results  Component Value Date   CHOLHDL 5 04/20/2014     Assessment & Plan  Essential hypertension Well controlled, no changes to meds. Encouraged heart healthy diet such as the DASH diet and exercise as tolerated.   Congestive heart failure No recent exacerbation, minimize sodium  Hypokalemia Mild, increase potassium in diet.   Obesity, Class III, BMI 40-49.9 (morbid obesity) Encouraged DASH diet, decrease po intake and increase exercise as tolerated. Needs 7-8 hours of sleep nightly. Avoid trans fats, eat small, frequent meals every 4-5 hours with lean proteins, complex carbs and healthy fats. Minimize simple carbs, GMO foods.  Hyperglycemia hgba1c acceptable, minimize simple carbs. Increase exercise as tolerated.

## 2014-07-21 NOTE — Patient Instructions (Addendum)
Try Witch Hazel Astringent and/or Hydrocortisone cream to scar for itching Sarna lotion, anti itch lotion Probiotic daily such as Digestive Advantage or PHillip's Colon Health    Basic Carbohydrate Counting for Diabetes Mellitus Carbohydrate counting is a method for keeping track of the amount of carbohydrates you eat. Eating carbohydrates naturally increases the level of sugar (glucose) in your blood, so it is important for you to know the amount that is okay for you to have in every meal. Carbohydrate counting helps keep the level of glucose in your blood within normal limits. The amount of carbohydrates allowed is different for every person. A dietitian can help you calculate the amount that is right for you. Once you know the amount of carbohydrates you can have, you can count the carbohydrates in the foods you want to eat. Carbohydrates are found in the following foods:  Grains, such as breads and cereals.  Dried beans and soy products.  Starchy vegetables, such as potatoes, peas, and corn.  Fruit and fruit juices.  Milk and yogurt.  Sweets and snack foods, such as cake, cookies, candy, chips, soft drinks, and fruit drinks. CARBOHYDRATE COUNTING There are two ways to count the carbohydrates in your food. You can use either of the methods or a combination of both. Reading the "Nutrition Facts" on Meyer The "Nutrition Facts" is an area that is included on the labels of almost all packaged food and beverages in the [place]. It includes the serving size of that food or beverage and information about the nutrients in each serving of the food, including the grams (g) of carbohydrate per serving.  Decide the number of servings of this food or beverage that you will be able to eat or drink. Multiply that number of servings by the number of grams of carbohydrate that is listed on the label for that serving. The total will be the amount of carbohydrates you will be having when you  eat or drink this food or beverage. Learning Standard Serving Sizes of Food When you eat food that is not packaged or does not include "Nutrition Facts" on the label, you need to measure the servings in order to count the amount of carbohydrates.A serving of most carbohydrate-rich foods contains about 15 g of carbohydrates. The following list includes serving sizes of carbohydrate-rich foods that provide 15 g ofcarbohydrate per serving:   1 slice of bread (1 oz) or 1 six-inch tortilla.    of a hamburger bun or English muffin.  4-6 crackers.   cup unsweetened dry cereal.    cup hot cereal.   cup rice or pasta.    cup mashed potatoes or  of a large baked potato.  1 cup fresh fruit or one small piece of fruit.    cup canned or frozen fruit or fruit juice.  1 cup milk.   cup plain fat-free yogurt or yogurt sweetened with artificial sweeteners.   cup cooked dried beans or starchy vegetable, such as peas, corn, or potatoes.  Decide the number of standard-size servings that you will eat. Multiply that number of servings by 15 (the grams of carbohydrates in that serving). For example, if you eat 2 cups of strawberries, you will have eaten 2 servings and 30 g of carbohydrates (2 servings x 15 g = 30 g). For foods such as soups and casseroles, in which more than one food is mixed in, you will need to count the carbohydrates in each food that is included. EXAMPLE OF CARBOHYDRATE  COUNTING Sample Dinner  3 oz chicken breast.   cup of brown rice.   cup of corn.  1 cup milk.   1 cup strawberries with sugar-free whipped topping.  Carbohydrate Calculation Step 1: Identify the foods that contain carbohydrates:   Rice.   Corn.   Milk.   Strawberries. Step 2:Calculate the number of servings eaten of each:   2 servings of rice.   1 serving of corn.   1 serving of milk.   1 serving of strawberries. Step 3: Multiply each of those number of servings by  15 g:   2 servings of rice x 15 g = 30 g.   1 serving of corn x 15 g = 15 g.   1 serving of milk x 15 g = 15 g.   1 serving of strawberries x 15 g = 15 g. Step 4: Add together all of the amounts to find the total grams of carbohydrates eaten: 30 g + 15 g + 15 g + 15 g = 75 g. Document Released: 08/07/2005 Document Revised: 12/22/2013 Document Reviewed: 07/04/2013 [facility] Patient Information 2015 [place], [place]. This information is not intended to replace advice given to you by your health care provider. Make sure you discuss any questions you have with your health care provider.

## 2014-07-21 NOTE — Progress Notes (Signed)
Pre visit review using our clinic review tool, if applicable. No additional management support is needed unless otherwise documented below in the visit note. 

## 2014-07-22 ENCOUNTER — Ambulatory Visit (INDEPENDENT_AMBULATORY_CARE_PROVIDER_SITE_OTHER): Payer: Medicare Other | Admitting: Podiatrist

## 2014-07-22 ENCOUNTER — Encounter: Payer: Self-pay | Admitting: Podiatrist

## 2014-07-22 DIAGNOSIS — B351 Tinea unguium: Secondary | ICD-10-CM

## 2014-07-22 DIAGNOSIS — M79609 Pain in unspecified limb: Principal | ICD-10-CM

## 2014-07-22 LAB — HEPATIC FUNCTION PANEL
ALT: 14 U/L (ref 0–53)
AST: 21 U/L (ref 0–37)
Albumin: 4 g/dL (ref 3.5–5.2)
Alkaline Phosphatase: 79 U/L (ref 39–117)
BILIRUBIN TOTAL: 1.4 mg/dL — AB (ref 0.2–1.2)
Bilirubin, Direct: 0.2 mg/dL (ref 0.0–0.3)
Total Protein: 7.4 g/dL (ref 6.0–8.3)

## 2014-07-22 LAB — RENAL FUNCTION PANEL
Albumin: 4 g/dL (ref 3.5–5.2)
BUN: 13 mg/dL (ref 6–23)
CO2: 28 meq/L (ref 19–32)
CREATININE: 1 mg/dL (ref 0.4–1.5)
Calcium: 9 mg/dL (ref 8.4–10.5)
Chloride: 104 mEq/L (ref 96–112)
GFR: 94.35 mL/min (ref >60.00)
GLUCOSE: 91 mg/dL (ref 70–99)
Phosphorus: 3 mg/dL (ref 2.3–4.6)
Potassium: 3.3 mEq/L — ABNORMAL LOW (ref 3.5–5.1)
Sodium: 141 mEq/L (ref 135–145)

## 2014-07-22 LAB — LIPID PANEL
Cholesterol: 171 mg/dL (ref 0–200)
HDL: 29.9 mg/dL — ABNORMAL LOW (ref >39.00)
LDL Cholesterol: 123 mg/dL — ABNORMAL HIGH (ref 0–99)
NONHDL: 141.1
Total CHOL/HDL Ratio: 6
Triglycerides: 89 mg/dL (ref 0.0–149.0)
VLDL: 17.8 mg/dL (ref 0.0–40.0)

## 2014-07-22 NOTE — Patient Instructions (Signed)
Diabetes and Foot Care Diabetes may cause you to have problems because of poor blood supply (circulation) to your feet and legs. This may cause the skin on your feet to become thinner, break easier, and heal more slowly. Your skin may become dry, and the skin may peel and crack. You may also have nerve damage in your legs and feet causing decreased feeling in them. You may not notice minor injuries to your feet that could lead to infections or more serious problems. Taking care of your feet is one of the most important things you can do for yourself.  HOME CARE INSTRUCTIONS  Wear shoes at all times, even in the house. Do not go barefoot. Bare feet are easily injured.  Check your feet daily for blisters, cuts, and redness. If you cannot see the bottom of your feet, use a mirror or ask someone for help.  Wash your feet with warm water (do not use hot water) and mild soap. Then pat your feet and the areas between your toes until they are completely dry. Do not soak your feet as this can dry your skin.  Apply a moisturizing lotion or petroleum jelly (that does not contain alcohol and is unscented) to the skin on your feet and to dry, brittle toenails. Do not apply lotion between your toes.  Trim your toenails straight across. Do not dig under them or around the cuticle. File the edges of your nails with an emery board or nail file.  Do not cut corns or calluses or try to remove them with medicine.  Wear clean socks or stockings every day. Make sure they are not too tight. Do not wear knee-high stockings since they may decrease blood flow to your legs.  Wear shoes that fit properly and have enough cushioning. To break in new shoes, wear them for just a few hours a day. This prevents you from injuring your feet. Always look in your shoes before you put them on to be sure there are no objects inside.  Do not cross your legs. This may decrease the blood flow to your feet.  If you find a minor scrape,  cut, or break in the skin on your feet, keep it and the skin around it clean and dry. These areas may be cleansed with mild soap and water. Do not cleanse the area with peroxide, alcohol, or iodine.  When you remove an adhesive bandage, be sure not to damage the skin around it.  If you have a wound, look at it several times a day to make sure it is healing.  Do not use heating pads or hot water bottles. They may burn your skin. If you have lost feeling in your feet or legs, you may not know it is happening until it is too late.  Make sure your health care provider performs a complete foot exam at least annually or more often if you have foot problems. Report any cuts, sores, or bruises to your health care provider immediately. SEEK MEDICAL CARE IF:   You have an injury that is not healing.  You have cuts or breaks in the skin.  You have an ingrown nail.  You notice redness on your legs or feet.  You feel burning or tingling in your legs or feet.  You have pain or cramps in your legs and feet.  Your legs or feet are numb.  Your feet always feel cold. SEEK IMMEDIATE MEDICAL CARE IF:   There is increasing redness,   swelling, or pain in or around a wound.  There is a red line that goes up your leg.  Pus is coming from a wound.  You develop a fever or as directed by your health care provider.  You notice a bad smell coming from an ulcer or wound. Document Released: 08/04/2000 Document Revised: 04/09/2013 Document Reviewed: 01/14/2013 [employer] Patient Information 2015 [employer]. This information is not intended to replace advice given to you by your health care provider. Make sure you discuss any questions you have with your health care provider.  

## 2014-07-26 ENCOUNTER — Encounter: Payer: Self-pay | Admitting: Family Medicine

## 2014-07-26 ENCOUNTER — Other Ambulatory Visit: Payer: Self-pay | Admitting: Family Medicine

## 2014-07-26 DIAGNOSIS — E876 Hypokalemia: Secondary | ICD-10-CM | POA: Insufficient documentation

## 2014-07-26 NOTE — Assessment & Plan Note (Signed)
Mild, increase potassium in diet.

## 2014-07-26 NOTE — Progress Notes (Signed)
HPI: Patient presents today for follow up of foot and nail care. Denies any new complaints today.  Objective: Patients chart is reviewed. Vascular status reveals pedal pulses noted at 2 out of 4 dp and pt bilateral . Neurological sensation is Normal to Semmes Weinstein monofilament bilateral. Patients nails are thickened, discolored, distrophic, friable and brittle with yellow-brown discoloration. Patient subjectively relates they are painful with shoes and with ambulation of bilateral feet.  Assessment: Symptomatic onychomycosis  Plan: Discussed treatment options and alternatives. The symptomatic toenails were debrided through manual an mechanical means without complication. Return appointment recommended at routine intervals of 3 months   

## 2014-07-26 NOTE — Assessment & Plan Note (Signed)
Encouraged DASH diet, decrease po intake and increase exercise as tolerated. Needs 7-8 hours of sleep nightly. Avoid trans fats, eat small, frequent meals every 4-5 hours with lean proteins, complex carbs and healthy fats. Minimize simple carbs, GMO foods. 

## 2014-07-26 NOTE — Assessment & Plan Note (Signed)
Well controlled, no changes to meds. Encouraged heart healthy diet such as the DASH diet and exercise as tolerated.  °

## 2014-07-26 NOTE — Assessment & Plan Note (Signed)
hgba1c acceptable, minimize simple carbs. Increase exercise as tolerated.  

## 2014-07-26 NOTE — Assessment & Plan Note (Signed)
No recent exacerbation, minimize sodium

## 2014-07-28 ENCOUNTER — Other Ambulatory Visit: Payer: Self-pay | Admitting: Family Medicine

## 2014-07-30 ENCOUNTER — Other Ambulatory Visit: Payer: Self-pay | Admitting: Family Medicine

## 2014-07-30 ENCOUNTER — Encounter (HOSPITAL_COMMUNITY): Payer: Self-pay | Admitting: Cardiovascular Disease

## 2014-07-31 NOTE — Telephone Encounter (Signed)
Amlodipine refilled per protocol. JG//CMA 

## 2014-08-20 ENCOUNTER — Other Ambulatory Visit: Payer: Self-pay | Admitting: Family Medicine

## 2014-08-24 NOTE — Telephone Encounter (Signed)
K-Dur refilled. JG//CMA

## 2014-09-07 ENCOUNTER — Other Ambulatory Visit: Payer: Self-pay | Admitting: Family Medicine

## 2014-10-09 ENCOUNTER — Other Ambulatory Visit: Payer: Self-pay | Admitting: Family Medicine

## 2014-10-22 ENCOUNTER — Ambulatory Visit (INDEPENDENT_AMBULATORY_CARE_PROVIDER_SITE_OTHER): Payer: Medicare Other | Admitting: Podiatrist

## 2014-10-22 ENCOUNTER — Encounter: Payer: Self-pay | Admitting: Podiatrist

## 2014-10-22 DIAGNOSIS — B351 Tinea unguium: Secondary | ICD-10-CM

## 2014-10-22 DIAGNOSIS — M79609 Pain in unspecified limb: Principal | ICD-10-CM

## 2014-10-22 NOTE — Progress Notes (Signed)
HPI: Patient presents today for follow up of foot and nail care. Denies any new complaints today.  Objective: Patients chart is reviewed. Vascular status reveals pedal pulses noted at 2 out of 4 dp and pt bilateral . Neurological sensation is Normal to Semmes Weinstein monofilament bilateral. Patients nails are thickened, discolored, distrophic, friable and brittle with yellow-brown discoloration. Patient subjectively relates they are painful with shoes and with ambulation of bilateral feet.  Assessment: Symptomatic onychomycosis  Plan: Discussed treatment options and alternatives. The symptomatic toenails were debrided through manual an mechanical means without complication. Return appointment recommended at routine intervals of 3 months   

## 2014-11-02 ENCOUNTER — Other Ambulatory Visit: Payer: Self-pay | Admitting: Family Medicine

## 2014-11-23 ENCOUNTER — Telehealth: Payer: Self-pay | Admitting: Family Medicine

## 2014-11-23 ENCOUNTER — Ambulatory Visit: Payer: Medicare Other | Admitting: Family Medicine

## 2014-11-23 NOTE — Telephone Encounter (Signed)
Please see below.

## 2014-11-23 NOTE — Telephone Encounter (Signed)
Charge no show 

## 2014-11-23 NOTE — Telephone Encounter (Signed)
Pt called this morning and stated he's car broke down over the weekend. Pt requesting $50 no show waived

## 2014-12-09 ENCOUNTER — Ambulatory Visit (INDEPENDENT_AMBULATORY_CARE_PROVIDER_SITE_OTHER): Payer: Medicare Other | Admitting: Cardiovascular Disease

## 2014-12-09 ENCOUNTER — Encounter: Payer: Self-pay | Admitting: Cardiovascular Disease

## 2014-12-09 VITALS — BP 132/72 | HR 83 | Ht 73.0 in | Wt 341.8 lb

## 2014-12-09 DIAGNOSIS — I5032 Chronic diastolic (congestive) heart failure: Secondary | ICD-10-CM

## 2014-12-09 DIAGNOSIS — Z952 Presence of prosthetic heart valve: Secondary | ICD-10-CM

## 2014-12-09 DIAGNOSIS — I359 Nonrheumatic aortic valve disorder, unspecified: Secondary | ICD-10-CM

## 2014-12-09 DIAGNOSIS — Z954 Presence of other heart-valve replacement: Secondary | ICD-10-CM

## 2014-12-09 DIAGNOSIS — I2510 Atherosclerotic heart disease of native coronary artery without angina pectoris: Secondary | ICD-10-CM | POA: Diagnosis not present

## 2014-12-09 DIAGNOSIS — I10 Essential (primary) hypertension: Secondary | ICD-10-CM | POA: Diagnosis not present

## 2014-12-09 NOTE — Progress Notes (Addendum)
Chief Complaint  Patient presents with  . 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 He was referred in May 2010 for SOB, abdominal swelling and LE edema. He was diagnosed with a non-ischemic CM, moderate AI, non-obstructive CAD and a dilated aortic root. He underwent a right and left heart cath June 2010 with non-obstructive coronary artery disease. A CT angiogram was performed in June 2010 to look closely at his aortic root and showed moderate dilation of the aortic root (root 4.6 cm, ascending aorta 3.8cm). Echo in the spring of 2011 showed enlarging aortic root. I planned on performing an MRI to look at his valve, degree of AI and size of root but he was too large for the scanner. His aortic root continued to enlarge. I saw him 11/13/11 and arranged a cardiac cath on 11/22/11 which showed enlarged root, no obstructive disease in the left system. I could not engage the RCA secondary to the abnormal takeoff with the enlarged aortic root. CTA was performed and showed dilated aortic root with patent RCA. He underwent aortic root replacement, aortic valve replacement and SVG to RCA on 12/28/11. BP has been elevated. Started on Micardis 05/14/13 in primary care. Weight has been stable on daily Lasix.    He is here today for follow up. No chest pain or SOB. No near syncope or syncope. He feels great. No LE edema.   Primary Care Physician: Daniel Nones   Past Medical History  Diagnosis Date  . Hypertension   . Aortic insufficiency      02/16/09 Chest CT:  The aortic root has a diameter measuring 4.6 cm, image 64 of the coronal series.  The ascending    thoracic aorta has a diameter of 3.8 cm, image 65 of the coronal series.  At  the level of the aortic arch    the thoracic aorta has a maximum diameter of 3.2 cm.  The descending thoracic aorta has a maximum           diameter of 3.2 cm.  There is no evidence for aortic disse        . GERD (gastroesophageal reflux disease)   . Hypokalemia   . Helicobacter pylori gastritis     (Tx Pylera 4/10)  . Cardiomyopathy      01/2009 - Cath - nonobs. dzs.  EF 35%.  . Gallstones     s/p cholecystectomy    . Sleep apnea     occasionally wears CPAP at night  . CAD (coronary artery disease)   . Aortic root enlargement     The aortic root has a diameter measuring 4.6 cm, image 64 of the coronal series.  The ascending  thoracic aorta has a diameter of 3.8 cm, image 65 of the coronal series.  At the level of the aortic arch   the thoracic aorta has a maximum diameter of 3.2 cm.  The descending thoracic aorta has a maximum   diameter of 3.2 cm.  There is no evidence for aortic dissection.       . Iron deficiency anemia   . CHF (congestive heart failure)   . Obesity, Class III, BMI 40-49.9 (morbid obesity)   . Heart murmur   .  PONV (postoperative nausea and vomiting)   . 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 Diabetes mellitus   . Dyslipidemia 01/14/2013    Past Surgical History  Procedure Laterality Date  . Cholecystectomy    . Adrenalectomy    . Colonoscopy w/ biopsies and polypectomy  11/04/2008    colon polyps, diverticulosis   . Upper gastrointestinal endoscopy  11/04/2008    w/bx, H pylori gastritis  . Upper gastrointestinal endoscopy  09/25/2011  . Colonoscopy  09/25/2011  . Multiple tooth extractions    . Tee without cardioversion  11/22/2011    Procedure: TRANSESOPHAGEAL ECHOCARDIOGRAM (TEE);  Surgeon: Larey Dresser, MD;   Location: [facility];  Service: Cardiovascular;  Laterality: N/A;  . Cardiac catheterization      2013  . Cardiac valve replacement  12/28/2011    Bentall with tissue valve  . Coronary artery bypass graft  12/28/2011    Procedure: CORONARY ARTERY BYPASS GRAFTING (CABG);  Surgeon: Rexene Alberts, MD;  Location: [facility];  Service: Open Heart Surgery;  Laterality: N/A;  cabg x 1  . Bentall procedure  12/28/2011    Procedure: BENTALL PROCEDURE;  Surgeon: Rexene Alberts, MD;  Location: [facility];  Service: Open Heart Surgery;  Laterality: N/A;  . Left and right heart catheterization with coronary angiogram N/A 11/22/2011    Procedure: LEFT AND RIGHT HEART CATHETERIZATION WITH CORONARY ANGIOGRAM;  Surgeon: Burnell Blanks, MD;  Location: [facility] CATH LAB;  Service: Cardiovascular;  Laterality: N/A;    Current Outpatient Prescriptions  Medication Sig Dispense Refill  . amLODipine (NORVASC) 10 MG tablet take 1 tablet by mouth once daily 90 tablet 1  . aspirin EC 81 MG tablet Take 81 mg by mouth daily.    . carvedilol (COREG) 25 MG tablet take 1 tablet by mouth twice a day with meals 60 tablet 6  . ferrous sulfate 325 (65 FE) MG tablet Take 325 mg by mouth 2 (two) times daily before a meal.    . furosemide (LASIX) 40 MG tablet Take 1 tablet (40 mg total) by mouth 2 (two) times daily. 180 tablet 3  . hydrochlorothiazide (HYDRODIURIL) 25 MG tablet take 1 tablet by mouth once daily 30 tablet 6  . Multiple Vitamin (MULITIVITAMIN WITH MINERALS) TABS Take 1 tablet by mouth daily.    Marland Kitchen omeprazole (PRILOSEC) 40 MG capsule take 1 capsule by mouth once daily 30 capsule 2  . potassium chloride SA (K-DUR,KLOR-CON) 20 MEQ tablet take 2 tablet by mouth three times a day 540 tablet 0  . telmisartan (MICARDIS) 80 MG tablet take 1 tablet by mouth once daily 30 tablet 2   No current facility-administered medications for this visit.    Allergies  Allergen Reactions  . Latex Hives    Latex gloves     History    Social History  . Marital Status: Married    Spouse Name: [name]  . Number of Children: 0  . Years of Education: N/A   Occupational History  .      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 Smoking status: Never Smoker   . Smokeless tobacco: Never Used  . Alcohol Use: No  . Drug Use: No  .  Sexual Activity: Not on file     Comment: lives with wife, pastros 2 churches   Other Topics Concern  . Not on file   Social History Narrative   The patient is married, lives with his wife in  [place].  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  He  lives a very sedentary lifestyle.  He is a nonsmoker.  He denies alcohol consumption.     Family History  Problem Relation Age of Onset  . Breast cancer Sister   . Diabetes Sister   . Colon cancer Brother 70    at least 34  . Diabetes Brother   . Cancer Brother     colon, prostate  . Prostate cancer Brother   . Diabetes Brother   . Diabetes Sister   . Cancer Sister     breast, colon, melanoma  . Hypertension Mother   . Arthritis Mother   . Kidney failure Sister   . Colon cancer Sister   . Breast cancer Sister   . Kidney disease Sister     s/p transplant  . Hypertension Sister   . Diabetes Sister   . Stroke Father   . Hypertension Father     Review of Systems:  As stated in the HPI and otherwise negative.   BP 132/72 mmHg  Pulse 83  Ht 6\' 1"  (1.854 m)  Wt 341 lb 12.8 oz (155.039 kg)  BMI 45.10 kg/m2  Physical Examination: General: Well developed, well nourished, NAD HEENT: OP clear, mucus membranes moist SKIN: warm, dry. No rashes. Neuro: No focal deficits Musculoskeletal: Muscle strength 5/5 all ext Psychiatric: Mood and affect normal Neck: No JVD, no carotid bruits, no thyromegaly, no lymphadenopathy. Lungs:Clear bilaterally, no wheezes, rhonci, crackles Cardiovascular: Regular rate and rhythm. Systolic murmur. No gallops or rubs. Abdomen:Soft. Bowel sounds present.  Non-tender.  Extremities: Trace bilateral lower extremity edema. Pulses are 2 + in the bilateral DP/PT.  Echo 04/02/12: Left ventricle: The cavity size was normal. There was severe concentric hypertrophy. The estimated ejection fraction was 55%. - Aortic valve: Normal appearing tissue AVR - Left atrium: The atrium was mildly dilated. - Atrial septum: Poorly visualized Cannot R/O PFO - Pericardium, extracardiac: Small mostly posterior pericardial effusion  EKG:  EKG is not ordered today. The ekg ordered today demonstrates   Recent Labs: 07/21/2014: ALT 14; BUN 13; Creatinine 1.0; Hemoglobin 13.9; Platelets 257.0; Potassium 3.3*; Sodium 141; TSH 1.11   Lipid Panel    Component Value Date/Time   CHOL 171 07/21/2014 1344   TRIG 89.0 07/21/2014 1344   HDL 29.90* 07/21/2014 1344   CHOLHDL 6 07/21/2014 1344   VLDL 17.8 07/21/2014 1344   LDLCALC 123* 07/21/2014 1344     Wt Readings from Last 3 Encounters:  12/09/14 341 lb 12.8 oz (155.039 kg)  07/21/14 344 lb 6.4 oz (156.219 kg)  06/11/14 344 lb 6.4 oz (156.219 kg)     Other studies Reviewed: Additional studies/ records that were reviewed today include: . Review of the above records demonstrates:   Assessment and Plan:   1. CAD, NATIVE VESSEL: S/p single vessel bypass to RCA. Stable. Continue ASA and beta blocker.   2. Acute on chronic diastolic CHF NYHA class 1: Weight is stable over last 6 months. Continue Lasix 40 mg BID. Check BMET today.   3. HYPERTENSION: BP controlled today. Managed in primary care. Will continue Coreg, Norvasc and Micardis/HCT.   4. S/P aortic valve replacement and aortoplasty: Stable. LV function normal  by echo August 2013. AVR working well. He has a mild systolic murmur. Will arrange echo to assess valve.    Current medicines are reviewed at length with the patient today.  The patient does not have concerns regarding medicines.  The following changes have been made:  no change  Labs/ tests  ordered today include: Echo  Orders Placed This Encounter  Procedures  . 2D Echocardiogram without contrast    Disposition:   FU with me in 6 months  Signed, Lauree Chandler, MD 12/09/2014 4:42 PM    [facility] [address], [place], [place]  [address] Phone: [phone]; Fax: [phone]

## 2014-12-09 NOTE — Patient Instructions (Signed)
Medication Instructions:  Your physician recommends that you continue on your current medications as directed. Please refer to the Current Medication list given to you today.   Labwork: none  Testing/Procedures: Your physician has requested that you have an echocardiogram. Echocardiography is a painless test that uses sound waves to create images of your heart. It provides your doctor with information about the size and shape of your heart and how well your heart's chambers and valves are working. This procedure takes approximately one hour. There are no restrictions for this procedure.    Follow-Up: Your physician wants you to follow-up in: 6 months.  You will receive a reminder letter in the mail two months in advance. If you don't receive a letter, please call our office to schedule the follow-up appointment.    Any Other Special Instructions Will Be Listed Below (If Applicable).   

## 2014-12-16 ENCOUNTER — Ambulatory Visit (HOSPITAL_COMMUNITY): Payer: Medicare Other | Attending: Cardiology | Admitting: Cardiology

## 2014-12-16 DIAGNOSIS — I359 Nonrheumatic aortic valve disorder, unspecified: Secondary | ICD-10-CM | POA: Insufficient documentation

## 2014-12-16 NOTE — Progress Notes (Signed)
Echo performed. 

## 2015-01-09 ENCOUNTER — Other Ambulatory Visit: Payer: Self-pay | Admitting: Family Medicine

## 2015-01-21 DIAGNOSIS — L905 Scar conditions and fibrosis of skin: Secondary | ICD-10-CM | POA: Diagnosis not present

## 2015-01-28 ENCOUNTER — Ambulatory Visit (INDEPENDENT_AMBULATORY_CARE_PROVIDER_SITE_OTHER): Payer: Medicare Other | Admitting: Podiatry

## 2015-01-28 ENCOUNTER — Other Ambulatory Visit: Payer: Self-pay | Admitting: Family Medicine

## 2015-01-28 ENCOUNTER — Encounter: Payer: Self-pay | Admitting: Podiatry

## 2015-01-28 DIAGNOSIS — M79609 Pain in unspecified limb: Secondary | ICD-10-CM

## 2015-01-28 DIAGNOSIS — B351 Tinea unguium: Secondary | ICD-10-CM

## 2015-01-28 NOTE — Progress Notes (Signed)
Patient ID: David Parsons, male   DOB: 14-Dec-1940, 74 y.o.   MRN: [id number] Complaint:  Visit Type: Patient returns to my office for continued preventative foot care services. Complaint: Patient states" my nails have grown long and thick and become painful to walk and wear shoes" Patient has been diagnosed with DM with no complications. He presents for preventative foot care services. No changes to ROS  Podiatric Exam: Vascular: dorsalis pedis and posterior tibial pulses are palpable bilateral. Capillary return is immediate. Temperature gradient is WNL. Skin turgor WNL  Sensorium: Normal Semmes Weinstein monofilament test. Normal tactile sensation bilaterally. Nail Exam: Pt has thick disfigured discolored nails with subungual debris noted bilateral entire nail hallux through fifth toenails Ulcer Exam: There is no evidence of ulcer or pre-ulcerative changes or infection. Orthopedic Exam: Muscle tone and strength are WNL. No limitations in general ROM. No crepitus or effusions noted. Foot type and digits show no abnormalities. Bony prominences are unremarkable. Skin: No Porokeratosis. No infection or ulcers  Diagnosis:  Tinea unguium, Pain in right toe, pain in left toes  Treatment & Plan Procedures and Treatment: Consent by patient was obtained for treatment procedures. The patient understood the discussion of treatment and procedures well. All questions were answered thoroughly reviewed. Debridement of mycotic and hypertrophic toenails, 1 through 5 bilateral and clearing of subungual debris. No ulceration, no infection noted.  Return Visit-Office Procedure: Patient instructed to return to the office for a follow up visit 3 months for continued evaluation and treatment.

## 2015-02-08 ENCOUNTER — Other Ambulatory Visit: Payer: Self-pay | Admitting: Family Medicine

## 2015-02-08 MED ORDER — AMLODIPINE BESYLATE 10 MG PO TABS: 10.0000 mg | ORAL_TABLET | Freq: Every day | ORAL | Status: DC

## 2015-03-09 ENCOUNTER — Telehealth: Payer: Self-pay | Admitting: Family Medicine

## 2015-03-09 ENCOUNTER — Ambulatory Visit (INDEPENDENT_AMBULATORY_CARE_PROVIDER_SITE_OTHER): Payer: Medicare Other | Admitting: Family Medicine

## 2015-03-09 ENCOUNTER — Encounter: Payer: Self-pay | Admitting: Family Medicine

## 2015-03-09 VITALS — BP 122/82 | HR 80 | Temp 97.8°F | Ht 73.0 in | Wt 346.2 lb

## 2015-03-09 DIAGNOSIS — I10 Essential (primary) hypertension: Secondary | ICD-10-CM | POA: Diagnosis not present

## 2015-03-09 DIAGNOSIS — D509 Iron deficiency anemia, unspecified: Secondary | ICD-10-CM

## 2015-03-09 DIAGNOSIS — E669 Obesity, unspecified: Secondary | ICD-10-CM

## 2015-03-09 DIAGNOSIS — E785 Hyperlipidemia, unspecified: Secondary | ICD-10-CM

## 2015-03-09 DIAGNOSIS — R739 Hyperglycemia, unspecified: Secondary | ICD-10-CM | POA: Diagnosis not present

## 2015-03-09 DIAGNOSIS — I2510 Atherosclerotic heart disease of native coronary artery without angina pectoris: Secondary | ICD-10-CM

## 2015-03-09 DIAGNOSIS — H547 Unspecified visual loss: Secondary | ICD-10-CM | POA: Diagnosis not present

## 2015-03-09 DIAGNOSIS — B351 Tinea unguium: Secondary | ICD-10-CM

## 2015-03-09 HISTORY — DX: Tinea unguium: B35.1

## 2015-03-09 LAB — HEMOGLOBIN A1C: Hgb A1c MFr Bld: 5.4 % (ref 4.6–6.5)

## 2015-03-09 LAB — COMPREHENSIVE METABOLIC PANEL
ALT: 15 U/L (ref 0–53)
AST: 17 U/L (ref 0–37)
Albumin: 4 g/dL (ref 3.5–5.2)
Alkaline Phosphatase: 81 U/L (ref 39–117)
BUN: 15 mg/dL (ref 6–23)
CALCIUM: 9.5 mg/dL (ref 8.4–10.5)
CO2: 30 meq/L (ref 19–32)
Chloride: 100 mEq/L (ref 96–112)
Creatinine, Ser: 1 mg/dL (ref 0.40–1.50)
GFR: 94.19 mL/min (ref >60.00)
Glucose, Bld: 123 mg/dL — ABNORMAL HIGH (ref 70–99)
Potassium: 3.6 mEq/L (ref 3.5–5.1)
Sodium: 141 mEq/L (ref 135–145)
TOTAL PROTEIN: 7.1 g/dL (ref 6.0–8.3)
Total Bilirubin: 1 mg/dL (ref 0.2–1.2)

## 2015-03-09 LAB — LIPID PANEL
CHOL/HDL RATIO: 5
CHOLESTEROL: 155 mg/dL (ref 0–200)
HDL: 32.2 mg/dL — ABNORMAL LOW (ref >39.00)
LDL CALC: 97 mg/dL (ref 0–99)
NonHDL: 122.8
Triglycerides: 128 mg/dL (ref 0.0–149.0)
VLDL: 25.6 mg/dL (ref 0.0–40.0)

## 2015-03-09 LAB — CBC
HCT: 44 % (ref 39.0–52.0)
Hemoglobin: 14.5 g/dL (ref 13.0–17.0)
MCHC: 33 g/dL (ref 30.0–36.0)
MCV: 88.7 fl (ref 78.0–100.0)
Platelets: 275 10*3/uL (ref 150.0–400.0)
RBC: 4.96 Mil/uL (ref 4.22–5.81)
RDW: 13.9 % (ref 11.5–15.5)
WBC: 9.1 10*3/uL (ref 4.0–10.5)

## 2015-03-09 LAB — TSH: TSH: 1.84 u[IU]/mL (ref 0.35–4.50)

## 2015-03-09 NOTE — Assessment & Plan Note (Signed)
Follows with [facility] seen in May

## 2015-03-09 NOTE — Assessment & Plan Note (Signed)
Encouraged heart healthy diet, increase exercise, avoid trans fats, consider a krill oil cap daily 

## 2015-03-09 NOTE — Assessment & Plan Note (Signed)
Encouraged DASH diet, decrease po intake and increase exercise as tolerated. Needs 7-8 hours of sleep nightly. Avoid trans fats, eat small, frequent meals every 4-5 hours with lean proteins, complex carbs and healthy fats. Minimize simple carbs 

## 2015-03-09 NOTE — Progress Notes (Signed)
David Parsons  [id number] December 08, 1940 03/09/2015      Progress Note-Follow Up  Subjective  Chief Complaint  Chief Complaint  Patient presents with  . Follow-up    HPI  Patient is a 74 y.o. male in today for routine medical care. Patient in doing well today, no recent illness or acute concerns. Seen recently by cardiology with no new concerns identified. Is following with [facility] for onychomycosis which is improving. Denies CP/palp/SOB/HA/congestion/fevers/GI or GU c/o. Taking meds as prescribed  Past Medical History  Diagnosis Date  . Hypertension   . Aortic insufficiency      02/16/09 Chest CT:  The aortic root has a diameter measuring 4.6 cm, image 64 of the coronal series.  The ascending    thoracic aorta has a diameter of 3.8 cm, image 65 of the coronal series.  At the level of the aortic arch    the thoracic aorta has a maximum diameter of 3.2 cm.  The descending thoracic aorta has a maximum           diameter of 3.2 cm.  There is no evidence for aortic disse        . GERD (gastroesophageal reflux disease)   . Hypokalemia   . Helicobacter pylori gastritis     (Tx Pylera 4/10)  . Cardiomyopathy      01/2009 - Cath - nonobs. dzs.  EF 35%.  . Gallstones     s/p cholecystectomy    . Sleep apnea     occasionally wears CPAP at night  . CAD (coronary artery disease)   . Aortic root enlargement     The aortic root has a diameter measuring 4.6 cm, image 64 of the coronal series.  The ascending  thoracic aorta has a diameter of 3.8 cm, image 65 of the coronal series.  At the level of the aortic arch   the thoracic aorta has a maximum diameter of 3.2 cm.  The descending thoracic aorta has a maximum   diameter of 3.2 cm.  There is no evidence for aortic dissection.       . Iron deficiency anemia   . CHF (congestive heart failure)   . Obesity, Class III, BMI 40-49.9 (morbid obesity)   . Heart murmur   . PONV (postoperative nausea and vomiting)   . S/P aortic valve replacement and  aortoplasty 12/28/2011    Biological Bentall Aortic Root Replacement using 25 mm Edwards Magna Ease pericardial tissue valve and 28 mm Vascutek Gelweave Valsalva conduit with reimplantation of left main and right coronary artery and CABG x1 using SVG to RCA  . Diabetes mellitus   . Dyslipidemia 01/14/2013    Past Surgical History  Procedure Laterality Date  . Cholecystectomy    . Adrenalectomy    . Colonoscopy w/ biopsies and polypectomy  11/04/2008    colon polyps, diverticulosis   . Upper gastrointestinal endoscopy  11/04/2008    w/bx, H pylori gastritis  . Upper gastrointestinal endoscopy  09/25/2011  . Colonoscopy  09/25/2011  . Multiple tooth extractions    . Tee without cardioversion  11/22/2011    Procedure: TRANSESOPHAGEAL ECHOCARDIOGRAM (TEE);  Surgeon: Larey Dresser, MD;  Location: [facility];  Service: Cardiovascular;  Laterality: N/A;  . Cardiac catheterization      2013  . Cardiac valve replacement  12/28/2011    Bentall with tissue valve  . Coronary artery bypass graft  12/28/2011    Procedure: CORONARY ARTERY BYPASS GRAFTING (CABG);  Surgeon:  Rexene Alberts, MD;  Location: [facility];  Service: Open Heart Surgery;  Laterality: N/A;  cabg x 1  . Bentall procedure  12/28/2011    Procedure: BENTALL PROCEDURE;  Surgeon: Rexene Alberts, MD;  Location: [facility];  Service: Open Heart Surgery;  Laterality: N/A;  . Left and right heart catheterization with coronary angiogram N/A 11/22/2011    Procedure: LEFT AND RIGHT HEART CATHETERIZATION WITH CORONARY ANGIOGRAM;  Surgeon: Burnell Blanks, MD;  Location[facility] C[facility] LAB;  Service: Cardiovascular;  Laterality: N/A;    Family History  Problem Relation Age of Onset  . Breast cancer Sister   . Diabetes Sister   . Colon cancer Brother 70    at least 79  . Diabetes Brother   . Cancer Brother     colon, prostate  . Prostate cancer Brother   . Diabetes Brother   . Diabetes Sister   . Cancer Sister     breast, colon, melanoma  .  Hypertension Mother   . Arthritis Mother   . Kidney failure Sister   . Colon cancer Sister   . Breast cancer Sister   . Kidney disease Sister     s/p transplant  . Hypertension Sister   . Diabetes Sister   . Stroke Father   . Hypertension Father     History   Social History  . Marital Status: Married    Spouse Name: [name]  . Number of Children: 0  . Years of Education: N/A   Occupational History  .      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 Smoking status: Never Smoker   . Smokeless tobacco: Never Used  . Alcohol Use: No  . Drug Use: No  . Sexual Activity: Not on file     Comment: lives with wife, pastros 2 churches   Other Topics Concern  . Not on file   Social History Narrative   The patient is married, lives with his wife in  [place].  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  He  lives a very sedentary lifestyle.  He is a nonsmoker.  He denies alcohol consumption.     Current Outpatient Prescriptions on File Prior to Visit  Medication Sig Dispense Refill  . amLODipine (NORVASC) 10 MG tablet Take 1 tablet (10 mg total) by mouth daily. 90 tablet 1  . aspirin EC 81 MG tablet Take 81 mg by mouth daily.    . carvedilol (COREG) 25 MG tablet take 1 tablet by mouth twice a day with meals 60 tablet 6  . clobetasol ointment (TEMOVATE) 0.05 % apply to affected area twice a day UPON ITCHING AND IRRITATION(NOT TO FACE OR GROIN)  0  . ferrous sulfate 325 (65 FE) MG tablet Take 325 mg by mouth 2 (two) times daily before a meal.    . furosemide (LASIX) 40 MG tablet Take 1 tablet (40 mg total) by mouth 2 (two) times daily. 180 tablet 3  . hydrochlorothiazide (HYDRODIURIL) 25 MG tablet take 1 tablet by mouth once daily 30 tablet 6  . Multiple Vitamin (MULITIVITAMIN WITH MINERALS) TABS Take 1 tablet by mouth daily.    Marland Kitchen omeprazole (PRILOSEC) 40 MG capsule take 1 capsule by mouth once daily 30 capsule 2  . potassium chloride SA  (K-DUR,KLOR-CON) 20 MEQ tablet take 2 tablets by mouth three times a day 540 tablet 0  . telmisartan (MICARDIS) 80 MG tablet take 1  tablet by mouth once daily 30 tablet 2   No current facility-administered medications on file prior to visit.    Allergies  Allergen Reactions  . Latex Hives    Latex gloves     Review of Systems  Review of Systems  Constitutional: Negative for fever and malaise/fatigue.  HENT: Negative for congestion.   Eyes: Negative for discharge.  Respiratory: Negative for shortness of breath.   Cardiovascular: Negative for chest pain, palpitations and leg swelling.  Gastrointestinal: Negative for nausea, abdominal pain and diarrhea.  Genitourinary: Negative for dysuria.  Musculoskeletal: Negative for falls.  Skin: Negative for rash.  Neurological: Negative for loss of consciousness and headaches.  Endo/Heme/Allergies: Negative for polydipsia.  Psychiatric/Behavioral: Negative for depression and suicidal ideas. The patient is not nervous/anxious and does not have insomnia.     Objective  BP 122/82 mmHg  Pulse 80  Temp(Src) 97.8 F (36.6 C) (Oral)  Ht 6\' 1"  (1.854 m)  Wt 346 lb 4 oz (157.058 kg)  BMI 45.69 kg/m2  SpO2 95%  Physical Exam  Physical Exam  Constitutional: He is oriented to person, place, and time and well-developed, well-nourished, and in no distress. No distress.  HENT:  Head: Normocephalic and atraumatic.  Eyes: Conjunctivae are normal.  Neck: Neck supple. No thyromegaly present.  Cardiovascular: Normal rate, regular rhythm and normal heart sounds.   No murmur heard. Pulmonary/Chest: Effort normal and breath sounds normal. No respiratory distress.  Abdominal: He exhibits no distension and no mass. There is no tenderness.  Musculoskeletal: He exhibits no edema.  Neurological: He is alert and oriented to person, place, and time.  Skin: Skin is warm.  Psychiatric: Memory, affect and judgment normal.    Lab Results  Component  Value Date   TSH 1.11 07/21/2014   Lab Results  Component Value Date   WBC 9.0 07/21/2014   HGB 13.9 07/21/2014   HCT 42.1 07/21/2014   MCV 89.9 07/21/2014   PLT 257.0 07/21/2014   Lab Results  Component Value Date   CREATININE 1.0 07/21/2014   BUN 13 07/21/2014   NA 141 07/21/2014   K 3.3* 07/21/2014   CL 104 07/21/2014   CO2 28 07/21/2014   Lab Results  Component Value Date   ALT 14 07/21/2014   AST 21 07/21/2014   ALKPHOS 79 07/21/2014   BILITOT 1.4* 07/21/2014   Lab Results  Component Value Date   CHOL 171 07/21/2014   Lab Results  Component Value Date   HDL 29.90* 07/21/2014   Lab Results  Component Value Date   LDLCALC 123* 07/21/2014   Lab Results  Component Value Date   TRIG 89.0 07/21/2014   Lab Results  Component Value Date   CHOLHDL 6 07/21/2014     Assessment & Plan  CARDIOMYOPATHY, DILATED Doing well no changes on recent Echo. No changes to care  Obesity, Class III, BMI 40-49.9 (morbid obesity) Encouraged DASH diet, decrease po intake and increase exercise as tolerated. Needs 7-8 hours of sleep nightly. Avoid trans fats, eat small, frequent meals every 4-5 hours with lean proteins, complex carbs and healthy fats. Minimize simple carbs  Hyperglycemia Encouraged DASH diet and hgba1c acceptable, minimize simple carbs. Increase exercise as tolerated.   Dyslipidemia Encouraged heart healthy diet, increase exercise, avoid trans fats, consider a krill oil cap daily

## 2015-03-09 NOTE — Patient Instructions (Signed)
DASH Eating Plan °DASH stands for "Dietary Approaches to Stop Hypertension." The DASH eating plan is a healthy eating plan that has been shown to reduce high blood pressure (hypertension). Additional health benefits may include reducing the risk of type 2 diabetes mellitus, heart disease, and stroke. The DASH eating plan may also help with weight loss. °WHAT DO I NEED TO KNOW ABOUT THE DASH EATING PLAN? °For the DASH eating plan, you will follow these general guidelines: °· Choose foods with a percent daily value for sodium of less than 5% (as listed on the food label). °· Use salt-free seasonings or herbs instead of table salt or sea salt. °· Check with your health care provider or pharmacist before using salt substitutes. °· Eat lower-sodium products, often labeled as "lower sodium" or "no salt added." °· Eat fresh foods. °· Eat more vegetables, fruits, and low-fat dairy products. °· Choose whole grains. Look for the word "whole" as the first word in the ingredient list. °· Choose fish and skinless chicken or turkey more often than red meat. Limit fish, poultry, and meat to 6 oz (170 g) each day. °· Limit sweets, desserts, sugars, and sugary drinks. °· Choose heart-healthy fats. °· Limit cheese to 1 oz (28 g) per day. °· Eat more home-cooked food and less restaurant, buffet, and fast food. °· Limit fried foods. °· Cook foods using methods other than frying. °· Limit canned vegetables. If you do use them, rinse them well to decrease the sodium. °· When eating at a restaurant, ask that your food be prepared with less salt, or no salt if possible. °WHAT FOODS CAN I EAT? °Seek help from a dietitian for individual calorie needs. °Grains °Whole grain or whole wheat bread. Brown rice. Whole grain or whole wheat pasta. Quinoa, bulgur, and whole grain cereals. Low-sodium cereals. Corn or whole wheat flour tortillas. Whole grain cornbread. Whole grain crackers. Low-sodium crackers. °Vegetables °Fresh or frozen vegetables  (raw, steamed, roasted, or grilled). Low-sodium or reduced-sodium tomato and vegetable juices. Low-sodium or reduced-sodium tomato sauce and paste. Low-sodium or reduced-sodium canned vegetables.  °Fruits °All fresh, canned (in natural juice), or frozen fruits. °Meat and Other Protein Products °Ground beef (85% or leaner), grass-fed beef, or beef trimmed of fat. Skinless chicken or turkey. Ground chicken or turkey. Pork trimmed of fat. All fish and seafood. Eggs. Dried beans, peas, or lentils. Unsalted nuts and seeds. Unsalted canned beans. °Dairy °Low-fat dairy products, such as skim or 1% milk, 2% or reduced-fat cheeses, low-fat ricotta or cottage cheese, or plain low-fat yogurt. Low-sodium or reduced-sodium cheeses. °Fats and Oils °Tub margarines without trans fats. Light or reduced-fat mayonnaise and salad dressings (reduced sodium). Avocado. Safflower, olive, or canola oils. Natural peanut or almond butter. °Other °Unsalted popcorn and pretzels. °The items listed above may not be a complete list of recommended foods or beverages. Contact your dietitian for more options. °WHAT FOODS ARE NOT RECOMMENDED? °Grains °White bread. White pasta. White rice. Refined cornbread. Bagels and croissants. Crackers that contain trans fat. °Vegetables °Creamed or fried vegetables. Vegetables in a cheese sauce. Regular canned vegetables. Regular canned tomato sauce and paste. Regular tomato and vegetable juices. °Fruits °Dried fruits. Canned fruit in light or heavy syrup. Fruit juice. °Meat and Other Protein Products °Fatty cuts of meat. Ribs, chicken wings, bacon, sausage, bologna, salami, chitterlings, fatback, hot dogs, bratwurst, and packaged luncheon meats. Salted nuts and seeds. Canned beans with salt. °Dairy °Whole or 2% milk, cream, half-and-half, and cream cheese. Whole-fat or sweetened yogurt. Full-fat   cheeses or blue cheese. Nondairy creamers and whipped toppings. Processed cheese, cheese spreads, or cheese  curds. °Condiments °Onion and garlic salt, seasoned salt, table salt, and sea salt. Canned and packaged gravies. Worcestershire sauce. Tartar sauce. Barbecue sauce. Teriyaki sauce. Soy sauce, including reduced sodium. Steak sauce. Fish sauce. Oyster sauce. Cocktail sauce. Horseradish. Ketchup and mustard. Meat flavorings and tenderizers. Bouillon cubes. Hot sauce. Tabasco sauce. Marinades. Taco seasonings. Relishes. °Fats and Oils °Butter, stick margarine, lard, shortening, ghee, and bacon fat. Coconut, palm kernel, or palm oils. Regular salad dressings. °Other °Pickles and olives. Salted popcorn and pretzels. °The items listed above may not be a complete list of foods and beverages to avoid. Contact your dietitian for more information. °WHERE CAN I FIND MORE INFORMATION? °National Heart, [employer], and [employer] Institute: www.nhlbi.nih.gov/health/health-topics/topics/dash/ °Document Released: 07/27/2011 Document Revised: 12/22/2013 Document Reviewed: 06/11/2013 °Exit[employer]® Patient Information ©2015 [employer]. This information is not intended to replace advice given to you by your health care provider. Make sure you discuss any questions you have with your health care provider. ° °

## 2015-03-09 NOTE — Assessment & Plan Note (Signed)
Encouraged DASH diet and hgba1c acceptable, minimize simple carbs. Increase exercise as tolerated.

## 2015-03-09 NOTE — Progress Notes (Signed)
Pre visit review using our clinic review tool, if applicable. No additional management support is needed unless otherwise documented below in the visit note. 

## 2015-03-09 NOTE — Telephone Encounter (Signed)
Relation to BD:HDIX  Call back number: [phone]   Reason for call:  Patient returning your call and would like a call before you leave today on cell phone. Best [phone]

## 2015-03-09 NOTE — Assessment & Plan Note (Signed)
Doing well no changes on recent Echo. No changes to care

## 2015-03-16 DIAGNOSIS — H04123 Dry eye syndrome of bilateral lacrimal glands: Secondary | ICD-10-CM | POA: Diagnosis not present

## 2015-03-16 DIAGNOSIS — H2513 Age-related nuclear cataract, bilateral: Secondary | ICD-10-CM | POA: Diagnosis not present

## 2015-03-16 DIAGNOSIS — H40021 Open angle with borderline findings, high risk, right eye: Secondary | ICD-10-CM | POA: Diagnosis not present

## 2015-03-23 DIAGNOSIS — H4011X2 Primary open-angle glaucoma, moderate stage: Secondary | ICD-10-CM | POA: Diagnosis not present

## 2015-03-23 DIAGNOSIS — H04123 Dry eye syndrome of bilateral lacrimal glands: Secondary | ICD-10-CM | POA: Diagnosis not present

## 2015-04-15 ENCOUNTER — Telehealth: Payer: Self-pay | Admitting: Family Medicine

## 2015-04-15 NOTE — Telephone Encounter (Signed)
Pt left msg 8/25 12:12pm to see if he should keep appt 04/22/15. Left msg advising he should keep, it is for medicare wellness visit.

## 2015-04-20 DIAGNOSIS — H4011X2 Primary open-angle glaucoma, moderate stage: Secondary | ICD-10-CM | POA: Diagnosis not present

## 2015-04-21 ENCOUNTER — Telehealth: Payer: Self-pay | Admitting: *Deleted

## 2015-04-21 ENCOUNTER — Encounter: Payer: Self-pay | Admitting: *Deleted

## 2015-04-21 NOTE — Addendum Note (Signed)
Addended by: Leticia Penna A on: 04/21/2015 11:13 AM   Modules accepted: Medications

## 2015-04-21 NOTE — Telephone Encounter (Signed)
Unable to reach patient at time of Pre-Visit Call.  Left message for patient to return call when available.    

## 2015-04-21 NOTE — Telephone Encounter (Signed)
Pre-Visit Call completed with patient and chart updated.   Pre-Visit Info documented in Specialty Comments under [facility].    

## 2015-04-22 ENCOUNTER — Encounter: Payer: Self-pay | Admitting: Family Medicine

## 2015-04-22 ENCOUNTER — Ambulatory Visit (INDEPENDENT_AMBULATORY_CARE_PROVIDER_SITE_OTHER): Payer: Medicare Other | Admitting: Family Medicine

## 2015-04-22 VITALS — BP 128/78 | HR 79 | Temp 98.5°F | Ht 73.0 in | Wt 346.4 lb

## 2015-04-22 DIAGNOSIS — E785 Hyperlipidemia, unspecified: Secondary | ICD-10-CM | POA: Diagnosis not present

## 2015-04-22 DIAGNOSIS — I119 Hypertensive heart disease without heart failure: Secondary | ICD-10-CM | POA: Diagnosis not present

## 2015-04-22 DIAGNOSIS — R739 Hyperglycemia, unspecified: Secondary | ICD-10-CM | POA: Diagnosis not present

## 2015-04-22 DIAGNOSIS — Z23 Encounter for immunization: Secondary | ICD-10-CM

## 2015-04-22 DIAGNOSIS — I10 Essential (primary) hypertension: Secondary | ICD-10-CM

## 2015-04-22 DIAGNOSIS — I2510 Atherosclerotic heart disease of native coronary artery without angina pectoris: Secondary | ICD-10-CM | POA: Diagnosis not present

## 2015-04-22 DIAGNOSIS — E66813 Obesity, class 3: Secondary | ICD-10-CM

## 2015-04-22 DIAGNOSIS — Z0000 Encounter for general adult medical examination without abnormal findings: Secondary | ICD-10-CM

## 2015-04-22 DIAGNOSIS — H9193 Unspecified hearing loss, bilateral: Secondary | ICD-10-CM

## 2015-04-22 DIAGNOSIS — D509 Iron deficiency anemia, unspecified: Secondary | ICD-10-CM

## 2015-04-22 MED ORDER — ZOSTER VACCINE LIVE 19400 UNT/0.65ML ~~LOC~~ SOLR: 0.6500 mL | Freq: Once | SUBCUTANEOUS | Status: DC

## 2015-04-22 NOTE — Patient Instructions (Signed)
Preventive Care for Adults A healthy lifestyle and preventive care can promote health and wellness. Preventive health guidelines for men include the following key practices:  A routine yearly physical is a good way to check with your health care provider about your health and preventative screening. It is a chance to share any concerns and updates on your health and to receive a thorough exam.  Visit your dentist for a routine exam and preventative care every 6 months. Brush your teeth twice a day and floss once a day. Good oral hygiene prevents tooth decay and gum disease.  The frequency of eye exams is based on your age, health, family medical history, use of contact lenses, and other factors. Follow your health care provider's recommendations for frequency of eye exams.  Eat a healthy diet. Foods such as vegetables, fruits, whole grains, low-fat dairy products, and lean protein foods contain the nutrients you need without too many calories. Decrease your intake of foods high in solid fats, added sugars, and salt. Eat the right amount of calories for you.Get information about a proper diet from your health care provider, if necessary.  Regular physical exercise is one of the most important things you can do for your health. Most adults should get at least 150 minutes of moderate-intensity exercise (any activity that increases your heart rate and causes you to sweat) each week. In addition, most adults need muscle-strengthening exercises on 2 or more days a week.  Maintain a healthy weight. The body mass index (BMI) is a screening tool to identify possible weight problems. It provides an estimate of body fat based on height and weight. Your health care provider can find your BMI and can help you achieve or maintain a healthy weight.For adults 20 years and older:  A BMI below 18.5 is considered underweight.  A BMI of 18.5 to 24.9 is normal.  A BMI of 25 to 29.9 is considered overweight.  A BMI  of 30 and above is considered obese.  Maintain normal blood lipids and cholesterol levels by exercising and minimizing your intake of saturated fat. Eat a balanced diet with plenty of fruit and vegetables. Blood tests for lipids and cholesterol should begin at age 50 and be repeated every 5 years. If your lipid or cholesterol levels are high, you are over 50, or you are at high risk for heart disease, you may need your cholesterol levels checked more frequently.Ongoing high lipid and cholesterol levels should be treated with medicines if diet and exercise are not working.  If you smoke, find out from your health care provider how to quit. If you do not use tobacco, do not start.  Lung cancer screening is recommended for adults aged 73-80 years who are at high risk for developing lung cancer because of a history of smoking. A yearly low-dose CT scan of the lungs is recommended for people who have at least a 30-pack-year history of smoking and are a current smoker or have quit within the past 15 years. A pack year of smoking is smoking an average of 1 pack of cigarettes a day for 1 year (for example: 1 pack a day for 30 years or 2 packs a day for 15 years). Yearly screening should continue until the smoker has stopped smoking for at least 15 years. Yearly screening should be stopped for people who develop a health problem that would prevent them from having lung cancer treatment.  If you choose to drink alcohol, do not have more than  2 drinks per day. One drink is considered to be 12 ounces (355 mL) of beer, 5 ounces (148 mL) of wine, or 1.5 ounces (44 mL) of liquor.  Avoid use of street drugs. Do not share needles with anyone. Ask for help if you need support or instructions about stopping the use of drugs.  High blood pressure causes heart disease and increases the risk of stroke. Your blood pressure should be checked at least every 1-2 years. Ongoing high blood pressure should be treated with  medicines, if weight loss and exercise are not effective.  If you are 45-79 years old, ask your health care provider if you should take aspirin to prevent heart disease.  Diabetes screening involves taking a blood sample to check your fasting blood sugar level. This should be done once every 3 years, after age 45, if you are within normal weight and without risk factors for diabetes. Testing should be considered at a younger age or be carried out more frequently if you are overweight and have at least 1 risk factor for diabetes.  Colorectal cancer can be detected and often prevented. Most routine colorectal cancer screening begins at the age of 50 and continues through age 75. However, your health care provider may recommend screening at an earlier age if you have risk factors for colon cancer. On a yearly basis, your health care provider may provide home test kits to check for hidden blood in the stool. Use of a small camera at the end of a tube to directly examine the colon (sigmoidoscopy or colonoscopy) can detect the earliest forms of colorectal cancer. Talk to your health care provider about this at age 50, when routine screening begins. Direct exam of the colon should be repeated every 5-10 years through age 75, unless early forms of precancerous polyps or small growths are found.  People who are at an increased risk for hepatitis B should be screened for this virus. You are considered at high risk for hepatitis B if:  You were born in a country where hepatitis B occurs often. Talk with your health care provider about which countries are considered high risk.  Your parents were born in a high-risk country and you have not received a shot to protect against hepatitis B (hepatitis B vaccine).  You have HIV or AIDS.  You use needles to inject street drugs.  You live with, or have sex with, someone who has hepatitis B.  You are a man who has sex with other men (MSM).  You get hemodialysis  treatment.  You take certain medicines for conditions such as cancer, organ transplantation, and autoimmune conditions.  Hepatitis C blood testing is recommended for all people born from 1945 through 1965 and any individual with known risks for hepatitis C.  Practice safe sex. Use condoms and avoid high-risk sexual practices to reduce the spread of sexually transmitted infections (STIs). STIs include gonorrhea, chlamydia, syphilis, trichomonas, herpes, HPV, and human immunodeficiency virus (HIV). Herpes, HIV, and HPV are viral illnesses that have no cure. They can result in disability, cancer, and death.  If you are at risk of being infected with HIV, it is recommended that you take a prescription medicine daily to prevent HIV infection. This is called preexposure prophylaxis (PrEP). You are considered at risk if:  You are a man who has sex with other men (MSM) and have other risk factors.  You are a heterosexual man, are sexually active, and are at increased risk for HIV infection.    You take drugs by injection.  You are sexually active with a partner who has HIV.  Talk with your health care provider about whether you are at high risk of being infected with HIV. If you choose to begin PrEP, you should first be tested for HIV. You should then be tested every 3 months for as long as you are taking PrEP.  A one-time screening for abdominal aortic aneurysm (AAA) and surgical repair of large AAAs by ultrasound are recommended for men ages 32 to 67 years who are current or former smokers.  Healthy men should no longer receive prostate-specific antigen (PSA) blood tests as part of routine cancer screening. Talk with your health care provider about prostate cancer screening.  Testicular cancer screening is not recommended for adult males who have no symptoms. Screening includes self-exam, a health care provider exam, and other screening tests. Consult with your health care provider about any symptoms  you have or any concerns you have about testicular cancer.  Use sunscreen. Apply sunscreen liberally and repeatedly throughout the day. You should seek shade when your shadow is shorter than you. Protect yourself by wearing long sleeves, pants, a wide-brimmed hat, and sunglasses year round, whenever you are outdoors.  Once a month, do a whole-body skin exam, using a mirror to look at the skin on your back. Tell your health care provider about new moles, moles that have irregular borders, moles that are larger than a pencil eraser, or moles that have changed in shape or color.  Stay current with required vaccines (immunizations).  Influenza vaccine. All adults should be immunized every year.  Tetanus, diphtheria, and acellular pertussis (Td, Tdap) vaccine. An adult who has not previously received Tdap or who does not know his vaccine status should receive 1 dose of Tdap. This initial dose should be followed by tetanus and diphtheria toxoids (Td) booster doses every 10 years. Adults with an unknown or incomplete history of completing a 3-dose immunization series with Td-containing vaccines should begin or complete a primary immunization series including a Tdap dose. Adults should receive a Td booster every 10 years.  Varicella vaccine. An adult without evidence of immunity to varicella should receive 2 doses or a second dose if he has previously received 1 dose.  Human papillomavirus (HPV) vaccine. Males aged 68-21 years who have not received the vaccine previously should receive the 3-dose series. Males aged 22-26 years may be immunized. Immunization is recommended through the age of 6 years for any male who has sex with males and did not get any or all doses earlier. Immunization is recommended for any person with an immunocompromised condition through the age of 49 years if he did not get any or all doses earlier. During the 3-dose series, the second dose should be obtained 4-8 weeks after the first  dose. The third dose should be obtained 24 weeks after the first dose and 16 weeks after the second dose.  Zoster vaccine. One dose is recommended for adults aged 50 years or older unless certain conditions are present.  Measles, mumps, and rubella (MMR) vaccine. Adults born before [birth year] generally are considered immune to measles and mumps. Adults born in [birth year] or later should have 1 or more doses of MMR vaccine unless there is a contraindication to the vaccine or there is laboratory evidence of immunity to each of the three diseases. A routine second dose of MMR vaccine should be obtained at least 28 days after the first dose for students attending postsecondary  schools, health care workers, or international travelers. People who received inactivated measles vaccine or an unknown type of measles vaccine during 1963-1967 should receive 2 doses of MMR vaccine. People who received inactivated mumps vaccine or an unknown type of mumps vaccine before 1979 and are at high risk for mumps infection should consider immunization with 2 doses of MMR vaccine. Unvaccinated health care workers born before 1957 who lack laboratory evidence of measles, mumps, or rubella immunity or laboratory confirmation of disease should consider measles and mumps immunization with 2 doses of MMR vaccine or rubella immunization with 1 dose of MMR vaccine.  Pneumococcal 13-valent conjugate (PCV13) vaccine. When indicated, a person who is uncertain of his immunization history and has no record of immunization should receive the PCV13 vaccine. An adult aged 19 years or older who has certain medical conditions and has not been previously immunized should receive 1 dose of PCV13 vaccine. This PCV13 should be followed with a dose of pneumococcal polysaccharide (PPSV23) vaccine. The PPSV23 vaccine dose should be obtained at least 8 weeks after the dose of PCV13 vaccine. An adult aged 19 years or older who has certain medical conditions and  previously received 1 or more doses of PPSV23 vaccine should receive 1 dose of PCV13. The PCV13 vaccine dose should be obtained 1 or more years after the last PPSV23 vaccine dose.  Pneumococcal polysaccharide (PPSV23) vaccine. When PCV13 is also indicated, PCV13 should be obtained first. All adults aged 65 years and older should be immunized. An adult younger than age 65 years who has certain medical conditions should be immunized. Any person who resides in a nursing home or long-term care facility should be immunized. An adult smoker should be immunized. People with an immunocompromised condition and certain other conditions should receive both PCV13 and PPSV23 vaccines. People with human immunodeficiency virus (HIV) infection should be immunized as soon as possible after diagnosis. Immunization during chemotherapy or radiation therapy should be avoided. Routine use of PPSV23 vaccine is not recommended for American Indians, [place] Natives, or people younger than 65 years unless there are medical conditions that require PPSV23 vaccine. When indicated, people who have unknown immunization and have no record of immunization should receive PPSV23 vaccine. One-time revaccination 5 years after the first dose of PPSV23 is recommended for people aged 19-64 years who have chronic kidney failure, nephrotic syndrome, asplenia, or immunocompromised conditions. People who received 1-2 doses of PPSV23 before age 65 years should receive another dose of PPSV23 vaccine at age 65 years or later if at least 5 years have passed since the previous dose. Doses of PPSV23 are not needed for people immunized with PPSV23 at or after age 65 years.  Meningococcal vaccine. Adults with asplenia or persistent complement component deficiencies should receive 2 doses of quadrivalent meningococcal conjugate (MenACWY-D) vaccine. The doses should be obtained at least 2 months apart. Microbiologists working with certain meningococcal bacteria,  military recruits, people at risk during an outbreak, and people who travel to or live in countries with a high rate of meningitis should be immunized. A first-year college student up through age 21 years who is living in a residence hall should receive a dose if he did not receive a dose on or after his 16th birthday. Adults who have certain high-risk conditions should receive one or more doses of vaccine.  Hepatitis A vaccine. Adults who wish to be protected from this disease, have certain high-risk conditions, work with hepatitis A-infected animals, work in hepatitis A research labs, or   travel to or work in countries with a high rate of hepatitis A should be immunized. Adults who were previously unvaccinated and who anticipate close contact with an international adoptee during the first 60 days after arrival in the [place] from a country with a high rate of hepatitis A should be immunized.  Hepatitis B vaccine. Adults should be immunized if they wish to be protected from this disease, have certain high-risk conditions, may be exposed to blood or other infectious body fluids, are household contacts or sex partners of hepatitis B positive people, are clients or workers in certain care facilities, or travel to or work in countries with a high rate of hepatitis B.  Haemophilus influenzae type b (Hib) vaccine. A previously unvaccinated person with asplenia or sickle cell disease or having a scheduled splenectomy should receive 1 dose of Hib vaccine. Regardless of previous immunization, a recipient of a hematopoietic stem cell transplant should receive a 3-dose series 6-12 months after his successful transplant. Hib vaccine is not recommended for adults with HIV infection. Preventive Service / Frequency Ages 52 to 17  Blood pressure check.** / Every 1 to 2 years.  Lipid and cholesterol check.** / Every 5 years beginning at age 69.  Hepatitis C blood test.** / For any individual with known risks for  hepatitis C.  Skin self-exam. / Monthly.  Influenza vaccine. / Every year.  Tetanus, diphtheria, and acellular pertussis (Tdap, Td) vaccine.** / Consult your health care provider. 1 dose of Td every 10 years.  Varicella vaccine.** / Consult your health care provider.  HPV vaccine. / 3 doses over 6 months, if 72 or younger.  Measles, mumps, rubella (MMR) vaccine.** / You need at least 1 dose of MMR if you were born in 1957 or later. You may also need a second dose.  Pneumococcal 13-valent conjugate (PCV13) vaccine.** / Consult your health care provider.  Pneumococcal polysaccharide (PPSV23) vaccine.** / 1 to 2 doses if you smoke cigarettes or if you have certain conditions.  Meningococcal vaccine.** / 1 dose if you are age 35 to 60 years and a Market researcher living in a residence hall, or have one of several medical conditions. You may also need additional booster doses.  Hepatitis A vaccine.** / Consult your health care provider.  Hepatitis B vaccine.** / Consult your health care provider.  Haemophilus influenzae type b (Hib) vaccine.** / Consult your health care provider. Ages 35 to 8  Blood pressure check.** / Every 1 to 2 years.  Lipid and cholesterol check.** / Every 5 years beginning at age 57.  Lung cancer screening. / Every year if you are aged 44-80 years and have a 30-pack-year history of smoking and currently smoke or have quit within the past 15 years. Yearly screening is stopped once you have quit smoking for at least 15 years or develop a health problem that would prevent you from having lung cancer treatment.  Fecal occult blood test (FOBT) of stool. / Every year beginning at age 55 and continuing until age 73. You may not have to do this test if you get a colonoscopy every 10 years.  Flexible sigmoidoscopy** or colonoscopy.** / Every 5 years for a flexible sigmoidoscopy or every 10 years for a colonoscopy beginning at age 28 and continuing until age  1.  Hepatitis C blood test.** / For all people born from [birth year] through 1965 and any individual with known risks for hepatitis C.  Skin self-exam. / Monthly.  Influenza vaccine. / Every  year.  Tetanus, diphtheria, and acellular pertussis (Tdap/Td) vaccine.** / Consult your health care provider. 1 dose of Td every 10 years.  Varicella vaccine.** / Consult your health care provider.  Zoster vaccine.** / 1 dose for adults aged 53 years or older.  Measles, mumps, rubella (MMR) vaccine.** / You need at least 1 dose of MMR if you were born in 1957 or later. You may also need a second dose.  Pneumococcal 13-valent conjugate (PCV13) vaccine.** / Consult your health care provider.  Pneumococcal polysaccharide (PPSV23) vaccine.** / 1 to 2 doses if you smoke cigarettes or if you have certain conditions.  Meningococcal vaccine.** / Consult your health care provider.  Hepatitis A vaccine.** / Consult your health care provider.  Hepatitis B vaccine.** / Consult your health care provider.  Haemophilus influenzae type b (Hib) vaccine.** / Consult your health care provider. Ages 77 and over  Blood pressure check.** / Every 1 to 2 years.  Lipid and cholesterol check.**/ Every 5 years beginning at age 85.  Lung cancer screening. / Every year if you are aged 55-80 years and have a 30-pack-year history of smoking and currently smoke or have quit within the past 15 years. Yearly screening is stopped once you have quit smoking for at least 15 years or develop a health problem that would prevent you from having lung cancer treatment.  Fecal occult blood test (FOBT) of stool. / Every year beginning at age 33 and continuing until age 11. You may not have to do this test if you get a colonoscopy every 10 years.  Flexible sigmoidoscopy** or colonoscopy.** / Every 5 years for a flexible sigmoidoscopy or every 10 years for a colonoscopy beginning at age 28 and continuing until age 73.  Hepatitis C blood  test.** / For all people born from [birth year] through 1965 and any individual with known risks for hepatitis C.  Abdominal aortic aneurysm (AAA) screening.** / A one-time screening for ages 50 to 27 years who are current or former smokers.  Skin self-exam. / Monthly.  Influenza vaccine. / Every year.  Tetanus, diphtheria, and acellular pertussis (Tdap/Td) vaccine.** / 1 dose of Td every 10 years.  Varicella vaccine.** / Consult your health care provider.  Zoster vaccine.** / 1 dose for adults aged 34 years or older.  Pneumococcal 13-valent conjugate (PCV13) vaccine.** / Consult your health care provider.  Pneumococcal polysaccharide (PPSV23) vaccine.** / 1 dose for all adults aged 63 years and older.  Meningococcal vaccine.** / Consult your health care provider.  Hepatitis A vaccine.** / Consult your health care provider.  Hepatitis B vaccine.** / Consult your health care provider.  Haemophilus influenzae type b (Hib) vaccine.** / Consult your health care provider. **Family history and personal history of risk and conditions may change your health care provider's recommendations. Document Released: 10/03/2001 Document Revised: 08/12/2013 Document Reviewed: 01/02/2011 [facility] Patient Information 2015 [place], [place]. This information is not intended to replace advice given to you by your health care provider. Make sure you discuss any questions you have with your health care provider.

## 2015-04-23 ENCOUNTER — Other Ambulatory Visit: Payer: Self-pay | Admitting: Family Medicine

## 2015-04-29 ENCOUNTER — Ambulatory Visit: Payer: Medicare Other | Admitting: Podiatry

## 2015-05-07 ENCOUNTER — Ambulatory Visit (INDEPENDENT_AMBULATORY_CARE_PROVIDER_SITE_OTHER): Payer: Medicare Other | Admitting: Podiatry

## 2015-05-07 ENCOUNTER — Encounter: Payer: Self-pay | Admitting: Podiatry

## 2015-05-07 DIAGNOSIS — M79609 Pain in unspecified limb: Secondary | ICD-10-CM

## 2015-05-07 DIAGNOSIS — M79673 Pain in unspecified foot: Secondary | ICD-10-CM

## 2015-05-07 DIAGNOSIS — B351 Tinea unguium: Secondary | ICD-10-CM

## 2015-05-07 NOTE — Progress Notes (Signed)
Patient ID: David Parsons, male   DOB: [birth year], 74 y.o.   MRN: [id number] Complaint:  Visit Type: Patient returns to my office for continued preventative foot care services. Complaint: Patient states" my nails have grown long and thick and become painful to walk and wear shoes" Patient has been diagnosed with DM with no complications. He presents for preventative foot care services. No changes to ROS  Podiatric Exam: Vascular: dorsalis pedis and posterior tibial pulses are palpable bilateral. Capillary return is immediate. Temperature gradient is WNL. Skin turgor WNL  Sensorium: Normal Semmes Weinstein monofilament test. Normal tactile sensation bilaterally. Nail Exam: Pt has thick disfigured discolored nails with subungual debris noted bilateral entire nail hallux through fifth toenails Ulcer Exam: There is no evidence of ulcer or pre-ulcerative changes or infection. Orthopedic Exam: Muscle tone and strength are WNL. No limitations in general ROM. No crepitus or effusions noted. Foot type and digits show no abnormalities. Bony prominences are unremarkable. Skin: No Porokeratosis. No infection or ulcers  Diagnosis:  Tinea unguium, Pain in right toe, pain in left toes  Treatment & Plan Procedures and Treatment: Consent by patient was obtained for treatment procedures. The patient understood the discussion of treatment and procedures well. All questions were answered thoroughly reviewed. Debridement of mycotic and hypertrophic toenails, 1 through 5 bilateral and clearing of subungual debris. No ulceration, no infection noted.  Return Visit-Office Procedure: Patient instructed to return to the office for a follow up visit 3 months for continued evaluation and treatment.

## 2015-05-09 ENCOUNTER — Encounter: Payer: Self-pay | Admitting: Family Medicine

## 2015-05-09 NOTE — Assessment & Plan Note (Signed)
Encouraged DASH diet, decrease po intake and increase exercise as tolerated. Needs 7-8 hours of sleep nightly. Avoid trans fats, eat small, frequent meals every 4-5 hours with lean proteins, complex carbs and healthy fats. Minimize simple carbs, GMO foods. 

## 2015-05-09 NOTE — Progress Notes (Signed)
Subjective:    Patient ID: David Parsons, male    DOB: 1940-12-24, 74 y.o.   MRN: [id number]  Chief Complaint  Patient presents with  . Medicare Wellness    HPI Patient is in today for patient in for annual wellness exam and follow-up on numerous medical conditions. Is doing fairly well. Reports he continues to follow with Dr. Posey Pronto at [facility]. Acknowledges some mild hearing loss recently. Continues to follow with podiatry for his foot and toenail care. No recent illness or acute concerns. Is managing activities of daily living well at home. Denies CP/palp/SOB/HA/congestion/fevers/GI or GU c/o. Taking meds as prescribed  Past Medical History  Diagnosis Date  . Hypertension   . Aortic insufficiency      02/16/09 Chest CT:  The aortic root has a diameter measuring 4.6 cm, image 64 of the coronal series.  The ascending    thoracic aorta has a diameter of 3.8 cm, image 65 of the coronal series.  At the level of the aortic arch    the thoracic aorta has a maximum diameter of 3.2 cm.  The descending thoracic aorta has a maximum           diameter of 3.2 cm.  There is no evidence for aortic disse        . GERD (gastroesophageal reflux disease)   . Hypokalemia   . Helicobacter pylori gastritis     (Tx Pylera 4/10)  . Cardiomyopathy      01/2009 - Cath - nonobs. dzs.  EF 35%.  . Gallstones     s/p cholecystectomy    . Sleep apnea     occasionally wears CPAP at night  . CAD (coronary artery disease)   . Aortic root enlargement     The aortic root has a diameter measuring 4.6 cm, image 64 of the coronal series.  The ascending  thoracic aorta has a diameter of 3.8 cm, image 65 of the coronal series.  At the level of the aortic arch   the thoracic aorta has a maximum diameter of 3.2 cm.  The descending thoracic aorta has a maximum   diameter of 3.2 cm.  There is no evidence for aortic dissection.       . Iron deficiency anemia   . CHF (congestive heart failure)   . Obesity, Class III, BMI  40-49.9 (morbid obesity)   . Heart murmur   . PONV (postoperative nausea and vomiting)   . 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 Diabetes mellitus   . Dyslipidemia 01/14/2013  . Onychomycosis 03/09/2015    Past Surgical History  Procedure Laterality Date  . Cholecystectomy    . Adrenalectomy    . Colonoscopy w/ biopsies and polypectomy  11/04/2008    colon polyps, diverticulosis   . Upper gastrointestinal endoscopy  11/04/2008    w/bx, H pylori gastritis  . Upper gastrointestinal endoscopy  09/25/2011  . Colonoscopy  09/25/2011  . Multiple tooth extractions    . Tee without cardioversion  11/22/2011    Procedure: TRANSESOPHAGEAL ECHOCARDIOGRAM (TEE);  Surgeon: Larey Dresser, MD;  Location: [place];  Service: Cardiovascular;  Laterality: N/A;  . Cardiac catheterization      2013  . Cardiac valve replacement  12/28/2011    Bentall with tissue valve  . Coronary artery bypass graft  12/28/2011    Procedure: CORONARY ARTERY BYPASS GRAFTING (CABG);  Surgeon: Rexene Alberts, MD;  Location: [place];  Service: Open Heart Surgery;  Laterality: N/A;  cabg x 1  . Bentall procedure  12/28/2011    Procedure: BENTALL PROCEDURE;  Surgeon: Rexene Alberts, MD;  Location: [place];  Service: Open Heart Surgery;  Laterality: N/A;  . Left and right heart catheterization with coronary angiogram N/A 11/22/2011    Procedure: LEFT AND RIGHT HEART CATHETERIZATION WITH CORONARY ANGIOGRAM;  Surgeon: Burnell Blanks, MD;  Location[facility] C[facility] LAB;  Service: Cardiovascular;  Laterality: N/A;    Family History  Problem Relation Age of Onset  . Breast cancer Sister   . Diabetes Sister   . Colon cancer Brother 70    at least 2  . Diabetes Brother   . Cancer Brother      colon, prostate  . Prostate cancer Brother   . Diabetes Brother   . Diabetes Sister   . Cancer Sister     breast, colon, melanoma  . Hypertension Mother   . Arthritis Mother   . Kidney failure Sister   . Colon cancer Sister   . Breast cancer Sister   . Kidney disease Sister     s/p transplant  . Hypertension Sister   . Diabetes Sister   . Stroke Father   . Hypertension Father     Social History   Social History  . Marital Status: Married    Spouse Name: [name]  . Number of Children: 0  . Years of Education: N/A   Occupational History  .      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 Smoking status: Never Smoker   . Smokeless tobacco: Never Used  . Alcohol Use: No  . Drug Use: No  . Sexual Activity: Not on file     Comment: lives with wife, pastros 2 churches   Other Topics Concern  . Not on file   Social History Narrative   The patient is married, lives with his wife in  [place].  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  He  lives a very sedentary lifestyle.  He is a nonsmoker.  He denies alcohol consumption.     Outpatient Prescriptions Prior to Visit  Medication Sig Dispense Refill  . amLODipine (NORVASC) 10 MG tablet Take 1 tablet (10 mg total) by mouth daily. 90 tablet 1  . aspirin EC 81 MG tablet Take 81 mg by mouth daily.    . carvedilol (COREG) 25 MG tablet take 1 tablet by mouth twice a day with meals 60 tablet 6  . clobetasol ointment (TEMOVATE) 0.05 % apply to affected area twice a day UPON ITCHING AND IRRITATION(NOT TO FACE OR GROIN)  0  . ferrous sulfate 325 (65 FE) MG tablet Take 325 mg by mouth 2 (two) times daily before a meal.    . furosemide (LASIX) 40 MG tablet Take 1 tablet (40 mg total) by mouth 2 (two) times daily. 180 tablet 3  . hydrochlorothiazide (HYDRODIURIL) 25 MG tablet take 1 tablet by mouth once daily 30 tablet 6  . Multiple Vitamin (MULITIVITAMIN WITH MINERALS) TABS Take 1 tablet by  mouth daily.    Marland Kitchen omeprazole (PRILOSEC) 40 MG capsule take 1 capsule by mouth once daily 30 capsule 2  . potassium chloride SA (  K-DUR,KLOR-CON) 20 MEQ tablet take 2 tablets by mouth three times a day 540 tablet 0  . telmisartan (MICARDIS) 80 MG tablet take 1 tablet by mouth once daily 30 tablet 2   No facility-administered medications prior to visit.    Allergies  Allergen Reactions  . Latex Hives    Latex gloves     Review of Systems  Constitutional: Negative for fever, chills and malaise/fatigue.  HENT: Negative for congestion and hearing loss.   Eyes: Negative for discharge.  Respiratory: Negative for cough, sputum production and shortness of breath.   Cardiovascular: Negative for chest pain, palpitations and leg swelling.  Gastrointestinal: Negative for heartburn, nausea, vomiting, abdominal pain, diarrhea, constipation and blood in stool.  Genitourinary: Negative for dysuria, urgency, frequency and hematuria.  Musculoskeletal: Negative for myalgias, back pain and falls.  Skin: Negative for rash.  Neurological: Negative for dizziness, sensory change, loss of consciousness, weakness and headaches.  Endo/Heme/Allergies: Negative for environmental allergies. Does not bruise/bleed easily.  Psychiatric/Behavioral: Negative for depression and suicidal ideas. The patient is not nervous/anxious and does not have insomnia.        Objective:    Physical Exam  Constitutional: He is oriented to person, place, and time. He appears well-developed and well-nourished. No distress.  HENT:  Head: Normocephalic and atraumatic.  Eyes: Conjunctivae are normal.  Neck: Neck supple. No thyromegaly present.  Cardiovascular: Normal rate and regular rhythm.   Murmur heard. Pulmonary/Chest: Effort normal and breath sounds normal. No respiratory distress. He has no wheezes.  Abdominal: Soft. Bowel sounds are normal. He exhibits no mass. There is no tenderness.  Musculoskeletal: He exhibits no  edema.  Lymphadenopathy:    He has no cervical adenopathy.  Neurological: He is alert and oriented to person, place, and time.  Skin: Skin is warm and dry.  Psychiatric: He has a normal mood and affect. His behavior is normal.    BP 128/78 mmHg  Pulse 79  Temp(Src) 98.5 F (36.9 C) (Oral)  Ht 6' 1"  (1.854 m)  Wt 346 lb 6 oz (157.115 kg)  BMI 45.71 kg/m2  SpO2 96% Wt Readings from Last 3 Encounters:  04/22/15 346 lb 6 oz (157.115 kg)  03/09/15 346 lb 4 oz (157.058 kg)  12/09/14 341 lb 12.8 oz (155.039 kg)     Lab Results  Component Value Date   WBC 9.1 03/09/2015   HGB 14.5 03/09/2015   HCT 44.0 03/09/2015   PLT 275.0 03/09/2015   GLUCOSE 123* 03/09/2015   CHOL 155 03/09/2015   TRIG 128.0 03/09/2015   HDL 32.20* 03/09/2015   LDLCALC 97 03/09/2015   ALT 15 03/09/2015   AST 17 03/09/2015   NA 141 03/09/2015   K 3.6 03/09/2015   CL 100 03/09/2015   CREATININE 1.00 03/09/2015   BUN 15 03/09/2015   CO2 30 03/09/2015   TSH 1.84 03/09/2015   PSA 7.32* 09/07/2010   INR 1.21 01/02/2012   HGBA1C 5.4 03/09/2015    Lab Results  Component Value Date   TSH 1.84 03/09/2015   Lab Results  Component Value Date   WBC 9.1 03/09/2015   HGB 14.5 03/09/2015   HCT 44.0 03/09/2015   MCV 88.7 03/09/2015   PLT 275.0 03/09/2015   Lab Results  Component Value Date   NA 141 03/09/2015   K 3.6 03/09/2015   CO2 30 03/09/2015   GLUCOSE 123* 03/09/2015   BUN 15 03/09/2015   CREATININE 1.00 03/09/2015   BILITOT 1.0 03/09/2015   ALKPHOS  81 03/09/2015   AST 17 03/09/2015   ALT 15 03/09/2015   PROT 7.1 03/09/2015   ALBUMIN 4.0 03/09/2015   CALCIUM 9.5 03/09/2015   GFR 94.19 03/09/2015   Lab Results  Component Value Date   CHOL 155 03/09/2015   Lab Results  Component Value Date   HDL 32.20* 03/09/2015   Lab Results  Component Value Date   LDLCALC 97 03/09/2015   Lab Results  Component Value Date   TRIG 128.0 03/09/2015   Lab Results  Component Value Date    CHOLHDL 5 03/09/2015   Lab Results  Component Value Date   HGBA1C 5.4 03/09/2015       Assessment & Plan:   Problem List Items Addressed This Visit    Obesity, Class III, BMI 40-49.9 (morbid obesity)    Encouraged DASH diet, decrease po intake and increase exercise as tolerated. Needs 7-8 hours of sleep nightly. Avoid trans fats, eat small, frequent meals every 4-5 hours with lean proteins, complex carbs and healthy fats. Minimize simple carbs, GMO foods.      Medicare annual wellness visit, subsequent    Patient denies any difficulties at home. No trouble with ADLs, depression or falls. No recent changes to vision or hearing. Is UTD with immunizations. Is UTD with screening. Discussed Advanced Directives. Encouraged heart healthy diet, exercise as tolerated and adequate sleep. Hearing loss noted, referred to audiology. Given and reviewed copy of ACP documents from [facility] Secretary of State and encouraged to complete and return  Sees Dr Carlean Purl of [facility] urology Sees podiatry, Dr Marlena Clipper Sees opthamology but cannot remember whom Dr Angelena Form of Cardiology colonoscopy in 2013      Iron deficiency anemia   Relevant Orders   CBC   TSH   Lipid panel   Comprehensive metabolic panel   Hemoglobin A1c   Hypertensive heart disease without heart failure    Well controlled, no changes to meds. Encouraged heart healthy diet such as the DASH diet and exercise as tolerated.       Relevant Orders   CBC   TSH   Lipid panel   Comprehensive metabolic panel   Hemoglobin A1c   CBC with Differential/Platelet   TSH   Comp Met (CMET)   Hyperglycemia    hgba1c acceptable, minimize simple carbs. Increase exercise as tolerated.       Relevant Orders   CBC   TSH   Lipid panel   Comprehensive metabolic panel   Hemoglobin A1c   Hemoglobin A1c   Essential hypertension   Relevant Orders   TSH   Dyslipidemia    Encouraged heart healthy diet, increase exercise, avoid  trans fats, consider a krill oil cap daily      Relevant Orders   CBC   TSH   Lipid panel   Comprehensive metabolic panel   Hemoglobin A1c   Lipid panel    Other Visit Diagnoses    Need for influenza vaccination    -  Primary    Relevant Orders    Flu vaccine HIGH DOSE PF (Fluzone High dose) (Completed)    CBC    TSH    Lipid panel    Comprehensive metabolic panel    Hemoglobin A1c    Hearing loss, bilateral        Relevant Orders    Ambulatory referral to Audiology    CBC    TSH    Lipid panel    Comprehensive metabolic panel  Hemoglobin A1c    Need for 23-polyvalent pneumococcal polysaccharide vaccine        Relevant Orders    Pneumococcal polysaccharide vaccine 23-valent greater than or equal to 2yo subcutaneous/IM (Completed)       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 I am also having him maintain his ferrous sulfate, multivitamin with minerals, aspirin EC, furosemide, omeprazole, carvedilol, hydrochlorothiazide, potassium chloride SA, clobetasol ointment, and amLODipine.  Meds ordered this encounter  Medications  . zoster vaccine live, PF, (ZOSTAVAX) 76160 UNT/0.65ML injection    Sig: Inject 19,400 Units into the skin once.    Dispense:  1 each    Refill:  0   Given flu shot today  [name], MD

## 2015-05-09 NOTE — Assessment & Plan Note (Signed)
hgba1c acceptable, minimize simple carbs. Increase exercise as tolerated.  

## 2015-05-09 NOTE — Assessment & Plan Note (Signed)
Well controlled, no changes to meds. Encouraged heart healthy diet such as the DASH diet and exercise as tolerated.  °

## 2015-05-09 NOTE — Assessment & Plan Note (Addendum)
Patient denies any difficulties at home. No trouble with ADLs, depression or falls. No recent changes to vision or hearing. Is UTD with immunizations. Is UTD with screening. Discussed Advanced Directives. Encouraged heart healthy diet, exercise as tolerated and adequate sleep. Hearing loss noted, referred to audiology. Given and reviewed copy of ACP documents from [facility] Secretary of State and encouraged to complete and return  Sees Dr Carlean Purl of [facility] urology Sees podiatry, Dr Marlena Clipper Sees opthamology but cannot remember whom Dr Angelena Form of Cardiology colonoscopy in 2013

## 2015-05-09 NOTE — Assessment & Plan Note (Signed)
Encouraged heart healthy diet, increase exercise, avoid trans fats, consider a krill oil cap daily 

## 2015-05-22 ENCOUNTER — Other Ambulatory Visit: Payer: Self-pay | Admitting: Family Medicine

## 2015-05-24 DIAGNOSIS — H04123 Dry eye syndrome of bilateral lacrimal glands: Secondary | ICD-10-CM | POA: Diagnosis not present

## 2015-05-24 DIAGNOSIS — H401132 Primary open-angle glaucoma, bilateral, moderate stage: Secondary | ICD-10-CM | POA: Diagnosis not present

## 2015-06-22 ENCOUNTER — Telehealth: Payer: Self-pay | Admitting: Family Medicine

## 2015-06-22 ENCOUNTER — Other Ambulatory Visit: Payer: Self-pay | Admitting: Family Medicine

## 2015-06-22 NOTE — Telephone Encounter (Signed)
Pt's wife brought in paperwork to be filled out Engineer, manufacturing systems Request). Wants it to be faxed.

## 2015-06-23 ENCOUNTER — Ambulatory Visit: Payer: Medicare Other | Attending: Audiology | Admitting: Audiology

## 2015-06-23 ENCOUNTER — Other Ambulatory Visit: Payer: Self-pay | Admitting: Family Medicine

## 2015-06-23 DIAGNOSIS — R292 Abnormal reflex: Secondary | ICD-10-CM

## 2015-06-23 DIAGNOSIS — Z01118 Encounter for examination of ears and hearing with other abnormal findings: Secondary | ICD-10-CM | POA: Insufficient documentation

## 2015-06-23 DIAGNOSIS — H93293 Other abnormal auditory perceptions, bilateral: Secondary | ICD-10-CM | POA: Diagnosis not present

## 2015-06-23 DIAGNOSIS — R94128 Abnormal results of other function studies of ear and other special senses: Secondary | ICD-10-CM | POA: Diagnosis not present

## 2015-06-23 DIAGNOSIS — H918X9 Other specified hearing loss, unspecified ear: Secondary | ICD-10-CM | POA: Insufficient documentation

## 2015-06-23 DIAGNOSIS — H9190 Unspecified hearing loss, unspecified ear: Secondary | ICD-10-CM

## 2015-06-23 DIAGNOSIS — H906 Mixed conductive and sensorineural hearing loss, bilateral: Secondary | ICD-10-CM | POA: Insufficient documentation

## 2015-06-23 NOTE — Telephone Encounter (Signed)
Forwarded to Dr. Blyth. JG//CMA  

## 2015-06-23 NOTE — Patient Instructions (Signed)
[employer] Services in [place] may help with obtaining one hearing aid or one captioned telephone if hearing loss and financial qualifications are met.  Please contact Rex Kras at [phone] .  List of Providers for [employer] Hearing [employer] Distribution [facility] February 19, 2012 - February 17, 2014. The following hearing aid companies have contracted with [place] to fit hearing aids for eligible applicants. Representatives of these companies are not [facility] employees. Inclusion on this list means the company agrees to the terms of contract pricing and inclusion is not an endorsement of their services over those who are not contracted with the division. An applicant may choose any provider listed from your region to assist in obtaining hearing aids through this service. If problems arise during this process, please contact the [place] that provided the application.  [facility] Vendor List 1 Updated; December 16, 2012  [place], [place] [facility]  [place] [phone] & [place] [phone] [place] [address] [place], [place] [facility] [place], [place] [facility] ENT [place] and [place], [place] [facility] Hearing Doctors [place], [place], [place] Doctors hearing Care/[facility] [address] [place], [place] ENT  [facility]  [phone] and [place]  [phone] Hearing [place] [phone] and [place] [phone] [place]  [phone] [facility] [phone] or [address] ENT [place], [place] [phone] [facility] ENT  [place], [place] [facility]  [place]  [phone] [facility] [place], [phone]  [place]  [phone]

## 2015-06-23 NOTE — Procedures (Signed)
[facility]    Outpatient Audiology and [place]  [place], [place] [address]  [phone]   Audiological Evaluation  Patient Name: David Parsons   Status: Outpatient   DOB: 18-Sep-1940    Diagnosis: Hearing loss MRN: [id number] Date:  06/23/2015     Referent: [name], MD  History: David Parsons was seen for an audiological evaluation. His primary concern "has difficulty talking with his wife" and "asks her to repeat". He was was accompanied by his wife who states that  David Parsons "turns the TV up real loud".  David Parsons previous hearing test was in "1975 at [place]" where he worked for "17 years".  He states that he wore hearing protection there.  Significant history of "controlled hypertension for the past 30 years".   There is no family history of childhood hearing loss. However, he reports that his sister had a "tumor in her ear that had to be removed".    Evaluation: Conventional pure tone audiometry from 250Hz  - 8000Hz  with using insert earphones. He has a symmetrical hearing thresholds of 30-35 dBHL at 250Hz ; 40-45 dBHL from 500Hz  - 2000Hz ; 45-55 dBHL at 4000hz  and 70-75 dBHL at 8000Hz .  The hearing loss is mixed bilaterally, but predominantly sensorineural. Reliability is good Speech reception levels (repeating words near threshold) using recorded spondee word lists:  Right ear: 35 dBHL.  Left ear:  35 dBHL Word recognition (at comfortably loud volumes) using recorded word lists at 75 dBHL, in quiet.  Right ear: 96%.  Left ear:   92% Word recognition in minimal background noise:  +5 dBHL  Right ear: 60%                              Left ear:  50%  Tympanometry (middle ear function) has an unusual configuration with a long left leg and hypercompliant tympanic membrane movement especially on the left side with absent acoustic reflexes bilaterally except for an elevated response at 500Hz  on the right side only (type Ad or ADD  bilaterally).   CONCLUSION:      David Parsons  has a symmetrical mild to moderate low frequency hearing loss sloping to a moderately-severe to severe high frequency hearing loss bilaterally. The hearing loss is primarily sensorineural with a mixed component.  His middle ear function is borderline to abnormal because of the hyper compliant tympanic membrane movement and absent acoustic reflexes.   David Parsons has excellent word recognition in quiet which drops to poor in minimal background noise in each ear.   David Parsons may be an excellent candidate  and also may  benefit from amplification; therefore a hearing aid evaluation, following further evaluation by an ENT is recommended.  RECOMMENDATIONS: 1.   Further evaluation by an [place] and Throat physician, prior to hearing aid fitting because of the family history of an "ear tumor".   2.   A hearing aid evaluation.  Please note that [employer] in [facility] was contacted and it was determined that David Parsons qualified for help obtaining one hearing aid if referred to an approved vendor such as [facility]. [phone] or [place] [phone]. Please contact Robin Searing at [phone] .  3.  Strategies that help improve hearing include: A) Face the speaker directly. Optimal is having the speakers face well - lit.  Unless amplified, being within 3-6 feet of the speaker  will enhance word recognition. B) Avoid having the speaker back-lit as this will minimize the ability to use cues from lip-reading, facial expression and gestures. C)  Word recognition is poorer in background noise. For optimal word recognition, turn off the TV, radio or noisy fan when engaging in conversation. In a restaurant, try to sit away from noise sources and close to the primary speaker.  D)  Ask for topic clarification from time to time in order to remain in the conversation.  Most people don't mind  repeating or clarifying a point when asked.  If needed, explain the difficulty hearing in background noise or hearing loss.  4.   Use hearing protection during noisy activities such as using a weed eater, moving the lawn, shooting, etc.      5.  Closely monitor hearing with a repeat audiological evaluation in 6-12 months.   Candela Krul L. Heide Spark, Au.D., CCC-A Doctor of Audiology 06/23/2015   cc: [name], MD

## 2015-06-23 NOTE — Progress Notes (Signed)
Sad to say was not aware some patient's qualify for free hearing aides. Can I refer directly to audiology or do I have to go through [facility]? He also needs a separate referral to ENT per your note? Thanks

## 2015-06-24 ENCOUNTER — Telehealth: Payer: Self-pay | Admitting: Family Medicine

## 2015-06-24 NOTE — Telephone Encounter (Signed)
Patient called back and agreed to a nurse visit in the morning 06/25/15 at 9:15 AM.   Paper work on [employer].

## 2015-06-24 NOTE — Telephone Encounter (Signed)
Called the patient on his cell number at [phone] left message to call back.  PCP received(paper work/clearance) request for dental work/antibiotic.  BP was checked and was 150/100 at dental office.Marland Kitchen  PCP requested before clearing patient for dental work to have a Nurse Visit to recheck BP.   Have called cell/home number left message to call back.

## 2015-06-25 ENCOUNTER — Ambulatory Visit (INDEPENDENT_AMBULATORY_CARE_PROVIDER_SITE_OTHER): Payer: Medicare Other | Admitting: Family Medicine

## 2015-06-25 VITALS — BP 140/84 | HR 77

## 2015-06-25 DIAGNOSIS — I2510 Atherosclerotic heart disease of native coronary artery without angina pectoris: Secondary | ICD-10-CM | POA: Diagnosis not present

## 2015-06-25 DIAGNOSIS — I10 Essential (primary) hypertension: Secondary | ICD-10-CM

## 2015-06-25 NOTE — Progress Notes (Signed)
Pre visit review using our clinic review tool, if applicable. No additional management support is needed unless otherwise documented below in the visit note.  Patient in for BP check.

## 2015-07-04 NOTE — Progress Notes (Signed)
Patient in for nurse visit to check blood pressure. No complaints. BP 140/84 mmHg  Pulse 77   Essential hypertension Adequate control, no changes to meds. Encouraged heart healthy diet such as the DASH diet and exercise as tolerated.

## 2015-07-04 NOTE — Assessment & Plan Note (Signed)
Adequate control, no changes to meds. Encouraged heart healthy diet such as the DASH diet and exercise as tolerated.  

## 2015-07-09 ENCOUNTER — Other Ambulatory Visit: Payer: Self-pay | Admitting: Family Medicine

## 2015-07-21 ENCOUNTER — Telehealth: Payer: Self-pay | Admitting: Cardiovascular Disease

## 2015-07-21 DIAGNOSIS — I5033 Acute on chronic diastolic (congestive) heart failure: Secondary | ICD-10-CM

## 2015-07-21 MED ORDER — FUROSEMIDE 40 MG PO TABS: 40.0000 mg | ORAL_TABLET | Freq: Two times a day (BID) | ORAL | Status: DC

## 2015-07-21 NOTE — Telephone Encounter (Signed)
Chart reviewed and pt was previously on Micadis/HCT combination.  These are now separate.  I reviewed with Dr. Angelena Form and pt can stop HCTZ.  Pt's wife notified. Refill for lasix sent to pharmacy with note that pt is stopping HCTZ.

## 2015-07-21 NOTE — Telephone Encounter (Signed)
NEW Message    Pt wife is calling and the pharmacy will not refill his Lasix because   they states that he is taking another medication Hydrochlorothiazide

## 2015-08-04 ENCOUNTER — Ambulatory Visit (INDEPENDENT_AMBULATORY_CARE_PROVIDER_SITE_OTHER): Payer: Medicare Other | Admitting: Podiatry

## 2015-08-04 ENCOUNTER — Encounter: Payer: Self-pay | Admitting: Podiatry

## 2015-08-04 DIAGNOSIS — M79673 Pain in unspecified foot: Secondary | ICD-10-CM

## 2015-08-04 DIAGNOSIS — B351 Tinea unguium: Secondary | ICD-10-CM

## 2015-08-04 DIAGNOSIS — M79609 Pain in unspecified limb: Principal | ICD-10-CM

## 2015-08-04 NOTE — Progress Notes (Signed)
Patient ID: David Parsons, male   DOB: [birth year], 74 y.o.   MRN: [id number] Complaint:  Visit Type: Patient returns to my office for continued preventative foot care services. Complaint: Patient states" my nails have grown long and thick and become painful to walk and wear shoes" . He presents for preventative foot care services. No changes to ROS  Podiatric Exam: Vascular: dorsalis pedis and posterior tibial pulses are palpable bilateral. Capillary return is immediate. Temperature gradient is WNL. Skin turgor WNL  Sensorium: Normal Semmes Weinstein monofilament test. Normal tactile sensation bilaterally. Nail Exam: Pt has thick disfigured discolored nails with subungual debris noted bilateral entire nail hallux through fifth toenails Ulcer Exam: There is no evidence of ulcer or pre-ulcerative changes or infection. Orthopedic Exam: Muscle tone and strength are WNL. No limitations in general ROM. No crepitus or effusions noted. Foot type and digits show no abnormalities. Bony prominences are unremarkable. Skin: No Porokeratosis. No infection or ulcers  Diagnosis:  Tinea unguium, Pain in right toe, pain in left toes  Treatment & Plan Procedures and Treatment: Consent by patient was obtained for treatment procedures. The patient understood the discussion of treatment and procedures well. All questions were answered thoroughly reviewed. Debridement of mycotic and hypertrophic toenails, 1 through 5 bilateral and clearing of subungual debris. No ulceration, no infection noted.  Return Visit-Office Procedure: Patient instructed to return to the office for a follow up visit 3 months for continued evaluation and treatment.  Gardiner Barefoot DPM

## 2015-08-18 ENCOUNTER — Encounter: Payer: Self-pay | Admitting: *Deleted

## 2015-08-31 NOTE — Progress Notes (Signed)
Chief Complaint  Patient presents with  . Follow-up    pt has no complaints  . 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 He was referred in May 2010 for SOB, abdominal swelling and LE edema. He was diagnosed with a non-ischemic CM, moderate AI, non-obstructive CAD and a dilated aortic root. He underwent a right and left heart cath June 2010 with non-obstructive coronary artery disease. A CT angiogram was performed in June 2010 to look closely at his aortic root and showed moderate dilation of the aortic root (root 4.6 cm, ascending aorta 3.8cm). Echo in the spring of 2011 showed enlarging aortic root. I planned on performing an MRI to look at his valve, degree of AI and size of root but he was too large for the scanner. His aortic root continued to enlarge. I saw him 11/13/11 and arranged a cardiac cath on 11/22/11 which showed enlarged root, no obstructive disease in the left system. I could not engage the RCA secondary to the abnormal takeoff with the enlarged aortic root. CTA was performed and showed dilated aortic root with patent RCA. He underwent aortic root replacement, aortic valve replacement and SVG to RCA on 12/28/11. BP has been elevated. Started on Micardis 05/14/13 in primary care. Weight has been stable on daily Lasix.  Echo April 2016 with normal LV function, mild gradient across the valve.   He is here today for follow up. No chest pain or SOB. No near syncope or syncope. He feels great. No LE edema.   Primary Care Physician: [name]   Past Medical History  Diagnosis Date  . Hypertension   . Aortic insufficiency      02/16/09 Chest CT:  The aortic root has a diameter measuring 4.6 cm, image 64 of the coronal  series.  The ascending    thoracic aorta has a diameter of 3.8 cm, image 65 of the coronal series.  At the level of the aortic arch    the thoracic aorta has a maximum diameter of 3.2 cm.  The descending thoracic aorta has a maximum           diameter of 3.2 cm.  There is no evidence for aortic disse        . GERD (gastroesophageal reflux disease)   . Hypokalemia   . Helicobacter pylori gastritis     (Tx [place] 4/10)  . Cardiomyopathy      01/2009 - Cath - nonobs. dzs.  EF 35%.  . Gallstones     s/p cholecystectomy    . Sleep apnea     occasionally wears CPAP at night  . CAD (coronary artery disease)   . Aortic root enlargement (HCC)     The aortic root has a diameter measuring 4.6 cm, image 64 of the coronal series.  The ascending  thoracic aorta has a diameter of 3.8 cm, image 65 of the coronal series.  At the level of the aortic arch   the thoracic aorta has a maximum diameter of 3.2 cm.  The descending thoracic aorta has a maximum   diameter of 3.2 cm.  There is no evidence for aortic dissection.       . Iron deficiency anemia   .  CHF (congestive heart failure) ([place])   . Obesity, Class III, BMI 40-49.9 (morbid obesity) ([place])   . Heart murmur   . PONV (postoperative nausea and vomiting)   . 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 Diabetes mellitus   . Dyslipidemia 01/14/2013  . Onychomycosis 03/09/2015    Past Surgical History  Procedure Laterality Date  . Cholecystectomy    . Adrenalectomy    . Colonoscopy w/ biopsies and polypectomy  11/04/2008    colon polyps, diverticulosis   . Upper gastrointestinal endoscopy  11/04/2008    w/bx, H pylori gastritis  . Upper gastrointestinal endoscopy  09/25/2011  . Colonoscopy  09/25/2011  . Multiple  tooth extractions    . Tee without cardioversion  11/22/2011    Procedure: TRANSESOPHAGEAL ECHOCARDIOGRAM (TEE);  Surgeon: Larey Dresser, MD;  Location: [facility];  Service: Cardiovascular;  Laterality: N/A;  . Cardiac catheterization      2013  . Cardiac valve replacement  12/28/2011    Bentall with tissue valve  . Coronary artery bypass graft  12/28/2011    Procedure: CORONARY ARTERY BYPASS GRAFTING (CABG);  Surgeon: Rexene Alberts, MD;  Location: [facility];  Service: Open Heart Surgery;  Laterality: N/A;  cabg x 1  . Bentall procedure  12/28/2011    Procedure: BENTALL PROCEDURE;  Surgeon: Rexene Alberts, MD;  Location: [facility];  Service: Open Heart Surgery;  Laterality: N/A;  . Left and right heart catheterization with coronary angiogram N/A 11/22/2011    Procedure: LEFT AND RIGHT HEART CATHETERIZATION WITH CORONARY ANGIOGRAM;  Surgeon: Burnell Blanks, MD;  Location: [facility] CATH LAB;  Service: Cardiovascular;  Laterality: N/A;    Current Outpatient Prescriptions  Medication Sig Dispense Refill  . amLODipine (NORVASC) 10 MG tablet Take 1 tablet (10 mg total) by mouth daily. 90 tablet 1  . aspirin EC 81 MG tablet Take 81 mg by mouth daily.    . carvedilol (COREG) 25 MG tablet take 1 tablet by mouth twice a day with meals 60 tablet 6  . clobetasol ointment (TEMOVATE) 0.05 % apply to affected area twice a day UPON ITCHING AND IRRITATION(NOT TO FACE OR GROIN)  0  . ferrous sulfate 325 (65 FE) MG tablet Take 325 mg by mouth 2 (two) times daily before a meal.    . furosemide (LASIX) 40 MG tablet Take 1 tablet (40 mg total) by mouth 2 (two) times daily. 180 tablet 3  . Multiple Vitamin (MULITIVITAMIN WITH MINERALS) TABS Take 1 tablet by mouth daily.    Marland Kitchen omeprazole (PRILOSEC) 40 MG capsule take 1 capsule by mouth once daily 30 capsule 2  . potassium chloride SA (K-DUR,KLOR-CON) 20 MEQ tablet TAKE 2 TABLETS BY MOUTH THREE TIMES A DAY 540 tablet 0  . telmisartan (MICARDIS) 80 MG tablet take 1 tablet  by mouth once daily 30 tablet 6  . zoster vaccine live, PF, (ZOSTAVAX) 09811 UNT/0.65ML injection Inject 19,400 Units into the skin once. 1 each 0   No current facility-administered medications for this visit.    Allergies  Allergen Reactions  . Latex Hives    Latex gloves     Social History   Social History  . Marital Status: Married    Spouse Name: [name]  .  Number of Children: 0  . Years of Education: N/A   Occupational History  .      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 Smoking status: Never Smoker   . Smokeless tobacco: Never Used  . Alcohol Use: No  . Drug Use: No  . Sexual Activity: Not on file     Comment: lives with wife, pastros 2 churches   Other Topics Concern  . Not on file   Social History Narrative   The patient is married, lives with his wife in  [place].  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  He  lives a very sedentary lifestyle.  He is a nonsmoker.  He denies alcohol consumption.     Family History  Problem Relation Age of Onset  . Breast cancer Sister   . Diabetes Sister   . Colon cancer Brother 70    at least 77  . Diabetes Brother   . Cancer Brother     colon, prostate  . Prostate cancer Brother   . Diabetes Brother   . Diabetes Sister   . Cancer Sister     breast, colon, melanoma  . Hypertension Mother   . Arthritis Mother   . Kidney failure Sister   . Colon cancer Sister   . Breast cancer Sister   . Kidney disease Sister     s/p transplant  . Hypertension Sister   . Diabetes Sister   . Stroke Father   . Hypertension Father     Review of Systems:  As stated in the HPI and otherwise negative.   BP 170/100 mmHg  Pulse 72  Ht 6\' 1"  (1.854 m)  Wt 348 lb (157.852 kg)  BMI 45.92 kg/m2  SpO2 92%  Physical Examination: General: Well developed, well nourished, NAD HEENT: OP clear, mucus membranes moist SKIN: warm, dry. No rashes. Neuro: No focal deficits Musculoskeletal:  Muscle strength 5/5 all ext Psychiatric: Mood and affect normal Neck: No JVD, no carotid bruits, no thyromegaly, no lymphadenopathy. Lungs:Clear bilaterally, no wheezes, rhonci, crackles Cardiovascular: Regular rate and rhythm. Systolic murmur. No gallops or rubs. Abdomen:Soft. Bowel sounds present. Non-tender.  Extremities: Trace bilateral lower extremity edema. Pulses are 2 + in the bilateral DP/PT.  Echo 12/16/14: Left ventricle: The cavity size was normal. Wall thickness was increased in a pattern of moderate LVH. Systolic function was normal. The estimated ejection fraction was in the range of 60% to 65%. Although no diagnostic regional wall motion abnormality was identified, this possibility cannot be completely excluded on the basis of this study. Doppler parameters are consistent with abnormal left ventricular relaxation (grade 1 diastolic dysfunction). - Ventricular septum: Mild D-shape to the interventricular septum, suggestive of RV pressure/volume overload. - Aortic valve: Bioprosthetic aortic valve is present. The gradient across the bioprosthetic aortic valve was not significantly elevated. There was no regurgitation. Mean gradient (S): 14 mm Hg. - Aorta: Mildly dilated aortic root. Aortic root dimension: 38 mm (ED). - Mitral valve: Mildly calcified annulus. Mildly calcified leaflets . There was no significant regurgitation. - Left atrium: The atrium was mildly dilated. - Right ventricle: Poorly visualized. The cavity size was mildly dilated. Systolic function was mildly reduced. - Right atrium: The atrium was mildly dilated. - Pulmonary arteries: No complete TR doppler jet so unable to estimate PA systolic pressure. - Inferior vena cava: The vessel was normal in size. The respirophasic diameter changes were in the normal range (>= 50%),  consistent with normal central venous pressure.  Impressions:  - Technically difficult study  with poor acoustic windows. Normal LV size with moderate LV hypertrophy. EF 60-65%. Mildly D-shaped interventricular septum, suggestive of RV pressure/volume overload. The RV was poorly visualized but appeared mildly dilated with mildly decreased systolic function. Bioprosthetic aortic valve appeared to function normally.   EKG:  EKG is ordered today. The ekg ordered today demonstrates Sinus, rate 76 bpm. 1st degree AV block. PAC. Incomplete RBBB  Recent Labs: 03/09/2015: ALT 15; BUN 15; Creatinine, Ser 1.00; Hemoglobin 14.5; Platelets 275.0; Potassium 3.6; Sodium 141; TSH 1.84   Lipid Panel    Component Value Date/Time   CHOL 155 03/09/2015 0838   TRIG 128.0 03/09/2015 0838   HDL 32.20* 03/09/2015 0838   CHOLHDL 5 03/09/2015 0838   VLDL 25.6 03/09/2015 0838   LDLCALC 97 03/09/2015 0838     Wt Readings from Last 3 Encounters:  09/01/15 348 lb (157.852 kg)  04/22/15 346 lb 6 oz (157.115 kg)  03/09/15 346 lb 4 oz (157.058 kg)     Other studies Reviewed: Additional studies/ records that were reviewed today include: . Review of the above records demonstrates:   Assessment and Plan:   1. CAD, NATIVE VESSEL: S/p single vessel bypass to RCA. Stable. Continue ASA and beta blocker.   2. Acute on chronic diastolic CHF NYHA class 1: Weight is stable. Continue Lasix 40 mg BID.   3. HYPERTENSION: BP controlled elevated today but controlled at home per pt. Managed in primary care. Will continue Coreg, Norvasc and Micardis.    4. S/P aortic valve replacement and aortoplasty: Stable. LV function normal by echo April 2016. AVR working well with mild expected gradient across valve.      Current medicines are reviewed at length with the patient today.  The patient does not have concerns regarding medicines.  The following changes have been made:  no change  Labs/ tests ordered today include: Echo  Orders Placed This Encounter  Procedures  . EKG 12-Lead    Disposition:    FU with me in 12 months  Signed, Lauree Chandler, MD 09/01/2015 10:02 AM    [facility] [place], [place], [place]  [address] Phone: [phone]; Fax: [phone]

## 2015-09-01 ENCOUNTER — Encounter: Payer: Self-pay | Admitting: Cardiovascular Disease

## 2015-09-01 ENCOUNTER — Ambulatory Visit (INDEPENDENT_AMBULATORY_CARE_PROVIDER_SITE_OTHER): Payer: Medicare Other | Admitting: Cardiovascular Disease

## 2015-09-01 VITALS — BP 170/100 | HR 72 | Ht 73.0 in | Wt 348.0 lb

## 2015-09-01 DIAGNOSIS — I5032 Chronic diastolic (congestive) heart failure: Secondary | ICD-10-CM

## 2015-09-01 DIAGNOSIS — I2510 Atherosclerotic heart disease of native coronary artery without angina pectoris: Secondary | ICD-10-CM | POA: Diagnosis not present

## 2015-09-01 DIAGNOSIS — I359 Nonrheumatic aortic valve disorder, unspecified: Secondary | ICD-10-CM

## 2015-09-01 DIAGNOSIS — I10 Essential (primary) hypertension: Secondary | ICD-10-CM

## 2015-09-01 NOTE — Patient Instructions (Signed)

## 2015-09-06 ENCOUNTER — Other Ambulatory Visit: Payer: Self-pay | Admitting: Family Medicine

## 2015-09-21 DIAGNOSIS — H04123 Dry eye syndrome of bilateral lacrimal glands: Secondary | ICD-10-CM | POA: Diagnosis not present

## 2015-09-21 DIAGNOSIS — H401132 Primary open-angle glaucoma, bilateral, moderate stage: Secondary | ICD-10-CM | POA: Diagnosis not present

## 2015-10-13 DIAGNOSIS — L905 Scar conditions and fibrosis of skin: Secondary | ICD-10-CM | POA: Diagnosis not present

## 2015-10-13 DIAGNOSIS — L280 Lichen simplex chronicus: Secondary | ICD-10-CM | POA: Diagnosis not present

## 2015-10-18 ENCOUNTER — Other Ambulatory Visit: Payer: Self-pay | Admitting: Family Medicine

## 2015-10-18 MED ORDER — POTASSIUM CHLORIDE CRYS ER 20 MEQ PO TBCR: 40.0000 meq | EXTENDED_RELEASE_TABLET | Freq: Three times a day (TID) | ORAL | Status: DC

## 2015-10-26 ENCOUNTER — Ambulatory Visit (INDEPENDENT_AMBULATORY_CARE_PROVIDER_SITE_OTHER): Payer: Medicare Other | Admitting: Family Medicine

## 2015-10-26 VITALS — BP 130/78 | HR 82 | Temp 98.4°F | Ht 74.0 in | Wt 341.5 lb

## 2015-10-26 DIAGNOSIS — I119 Hypertensive heart disease without heart failure: Secondary | ICD-10-CM

## 2015-10-26 DIAGNOSIS — R739 Hyperglycemia, unspecified: Secondary | ICD-10-CM | POA: Diagnosis not present

## 2015-10-26 DIAGNOSIS — Z23 Encounter for immunization: Secondary | ICD-10-CM

## 2015-10-26 DIAGNOSIS — D509 Iron deficiency anemia, unspecified: Secondary | ICD-10-CM

## 2015-10-26 DIAGNOSIS — I10 Essential (primary) hypertension: Secondary | ICD-10-CM

## 2015-10-26 DIAGNOSIS — E785 Hyperlipidemia, unspecified: Secondary | ICD-10-CM | POA: Diagnosis not present

## 2015-10-26 DIAGNOSIS — I2510 Atherosclerotic heart disease of native coronary artery without angina pectoris: Secondary | ICD-10-CM

## 2015-10-26 LAB — COMPREHENSIVE METABOLIC PANEL
ALT: 11 U/L (ref 0–53)
AST: 12 U/L (ref 0–37)
Albumin: 4 g/dL (ref 3.5–5.2)
Alkaline Phosphatase: 98 U/L (ref 39–117)
BUN: 12 mg/dL (ref 6–23)
CALCIUM: 9.3 mg/dL (ref 8.4–10.5)
CHLORIDE: 107 meq/L (ref 96–112)
CO2: 26 meq/L (ref 19–32)
Creatinine, Ser: 0.87 mg/dL (ref 0.40–1.50)
GFR: 110.41 mL/min (ref >60.00)
GLUCOSE: 117 mg/dL — AB (ref 70–99)
Potassium: 3.9 mEq/L (ref 3.5–5.1)
Sodium: 145 mEq/L (ref 135–145)
Total Bilirubin: 0.8 mg/dL (ref 0.2–1.2)
Total Protein: 7 g/dL (ref 6.0–8.3)

## 2015-10-26 LAB — CBC
HEMATOCRIT: 42.7 % (ref 39.0–52.0)
Hemoglobin: 13.9 g/dL (ref 13.0–17.0)
MCHC: 32.6 g/dL (ref 30.0–36.0)
MCV: 86.6 fl (ref 78.0–100.0)
PLATELETS: 277 10*3/uL (ref 150.0–400.0)
RBC: 4.93 Mil/uL (ref 4.22–5.81)
RDW: 14.1 % (ref 11.5–15.5)
WBC: 9 10*3/uL (ref 4.0–10.5)

## 2015-10-26 LAB — LIPID PANEL
CHOL/HDL RATIO: 4
Cholesterol: 146 mg/dL (ref 0–200)
HDL: 33.7 mg/dL — AB (ref >39.00)
LDL Cholesterol: 98 mg/dL (ref 0–99)
NONHDL: 111.9
Triglycerides: 68 mg/dL (ref 0.0–149.0)
VLDL: 13.6 mg/dL (ref 0.0–40.0)

## 2015-10-26 LAB — HEMOGLOBIN A1C: HEMOGLOBIN A1C: 5.5 % (ref 4.6–6.5)

## 2015-10-26 LAB — TSH: TSH: 1.45 u[IU]/mL (ref 0.35–4.50)

## 2015-10-26 MED ORDER — ZOSTER VACCINE LIVE 19400 UNT/0.65ML ~~LOC~~ SOLR: 0.6500 mL | Freq: Once | SUBCUTANEOUS | Status: DC

## 2015-10-26 MED ORDER — TRIAMTERENE-HCTZ 37.5-25 MG PO TABS: 1.0000 | ORAL_TABLET | Freq: Every day | ORAL | Status: DC

## 2015-10-26 NOTE — Progress Notes (Signed)
Pre visit review using our clinic review tool, if applicable. No additional management support is needed unless otherwise documented below in the visit note. 

## 2015-10-26 NOTE — Patient Instructions (Addendum)
Compression Stockings for Leg swelling-Knee high and light weight  DASH Eating Plan DASH stands for "Dietary Approaches to Stop Hypertension." The DASH eating plan is a healthy eating plan that has been shown to reduce high blood pressure (hypertension). Additional health benefits may include reducing the risk of type 2 diabetes mellitus, heart disease, and stroke. The DASH eating plan may also help with weight loss. WHAT DO I NEED TO KNOW ABOUT THE DASH EATING PLAN? For the DASH eating plan, you will follow these general guidelines:  Choose foods with a percent daily value for sodium of less than 5% (as listed on the food label).  Use salt-free seasonings or herbs instead of table salt or sea salt.  Check with your health care provider or pharmacist before using salt substitutes.  Eat lower-sodium products, often labeled as "lower sodium" or "no salt added."  Eat fresh foods.  Eat more vegetables, fruits, and low-fat dairy products.  Choose whole grains. Look for the word "whole" as the first word in the ingredient list.  Choose fish and skinless chicken or [place] more often than red meat. Limit fish, poultry, and meat to 6 oz (170 g) each day.  Limit sweets, desserts, sugars, and sugary drinks.  Choose heart-healthy fats.  Limit cheese to 1 oz (28 g) per day.  Eat more home-cooked food and less restaurant, buffet, and fast food.  Limit fried foods.  Cook foods using methods other than frying.  Limit canned vegetables. If you do use them, rinse them well to decrease the sodium.  When eating at a restaurant, ask that your food be prepared with less salt, or no salt if possible. WHAT FOODS CAN I EAT? Seek help from a dietitian for individual calorie needs. Grains Whole grain or whole wheat bread. Brown rice. Whole grain or whole wheat pasta. Quinoa, bulgur, and whole grain cereals. Low-sodium cereals. Corn or whole wheat flour tortillas. Whole grain cornbread. Whole grain  crackers. Low-sodium crackers. Vegetables Fresh or frozen vegetables (raw, steamed, roasted, or grilled). Low-sodium or reduced-sodium tomato and vegetable juices. Low-sodium or reduced-sodium tomato sauce and paste. Low-sodium or reduced-sodium canned vegetables.  Fruits All fresh, canned (in natural juice), or frozen fruits. Meat and Other Protein Products Ground beef (85% or leaner), grass-fed beef, or beef trimmed of fat. Skinless chicken or [place]. Ground chicken or [place]. Pork trimmed of fat. All fish and seafood. Eggs. Dried beans, peas, or lentils. Unsalted nuts and seeds. Unsalted canned beans. Dairy Low-fat dairy products, such as skim or 1% milk, 2% or reduced-fat cheeses, low-fat ricotta or cottage cheese, or plain low-fat yogurt. Low-sodium or reduced-sodium cheeses. Fats and Oils Tub margarines without trans fats. Light or reduced-fat mayonnaise and salad dressings (reduced sodium). Avocado. Safflower, olive, or canola oils. Natural peanut or almond butter. Other Unsalted popcorn and pretzels. The items listed above may not be a complete list of recommended foods or beverages. Contact your dietitian for more options. WHAT FOODS ARE NOT RECOMMENDED? Grains White bread. White pasta. White rice. Refined cornbread. Bagels and croissants. Crackers that contain trans fat. Vegetables Creamed or fried vegetables. Vegetables in a cheese sauce. Regular canned vegetables. Regular canned tomato sauce and paste. Regular tomato and vegetable juices. Fruits Dried fruits. Canned fruit in light or heavy syrup. Fruit juice. Meat and Other Protein Products Fatty cuts of meat. Ribs, chicken wings, bacon, sausage, bologna, salami, chitterlings, fatback, hot dogs, bratwurst, and packaged luncheon meats. Salted nuts and seeds. Canned beans with salt. Dairy Whole or 2% milk,  cream, half-and-half, and cream cheese. Whole-fat or sweetened yogurt. Full-fat cheeses or blue cheese. Nondairy creamers and  whipped toppings. Processed cheese, cheese spreads, or cheese curds. Condiments Onion and garlic salt, seasoned salt, table salt, and sea salt. Canned and packaged gravies. Worcestershire sauce. Tartar sauce. Barbecue sauce. Teriyaki sauce. Soy sauce, including reduced sodium. Steak sauce. Fish sauce. Oyster sauce. Cocktail sauce. Horseradish. Ketchup and mustard. Meat flavorings and tenderizers. Bouillon cubes. Hot sauce. Tabasco sauce. Marinades. Taco seasonings. Relishes. Fats and Oils Butter, stick margarine, lard, shortening, ghee, and bacon fat. Coconut, palm kernel, or palm oils. Regular salad dressings. Other Pickles and olives. Salted popcorn and pretzels. The items listed above may not be a complete list of foods and beverages to avoid. Contact your dietitian for more information. WHERE CAN I FIND MORE INFORMATION? National Heart, Lung, and Blood Institute: travelstabloid.com   This information is not intended to replace advice given to you by your health care provider. Make sure you discuss any questions you have with your health care provider.   Document Released: 07/27/2011 Document Revised: 08/28/2014 Document Reviewed: 06/11/2013 Elsevier Interactive Patient Education [employer].

## 2015-10-26 NOTE — Progress Notes (Signed)
Patient ID: David Parsons, male   DOB: 1940-09-01, 75 y.o.   MRN: [id number]   Subjective:    Patient ID: David Parsons, male    DOB: 04/13/1941, 75 y.o.   MRN: [id number]  Chief Complaint  Patient presents with  . Follow-up    HPI Patient is in today for follow up. He feels well today. He has been seen by opthamology and diagnosed with Glaucoma but with tretament his pressure is improving. No recent illness. Has also seen cardiology. Denies CP/palp/SOB/HA/congestion/fevers/GI or GU c/o. Taking meds as prescribed  Past Medical History  Diagnosis Date  . Hypertension   . Aortic insufficiency      02/16/09 Chest CT:  The aortic root has a diameter measuring 4.6 cm, image 64 of the coronal series.  The ascending    thoracic aorta has a diameter of 3.8 cm, image 65 of the coronal series.  At the level of the aortic arch    the thoracic aorta has a maximum diameter of 3.2 cm.  The descending thoracic aorta has a maximum           diameter of 3.2 cm.  There is no evidence for aortic disse        . GERD (gastroesophageal reflux disease)   . Hypokalemia   . Helicobacter pylori gastritis     (Tx Pylera 4/10)  . Cardiomyopathy      01/2009 - Cath - nonobs. dzs.  EF 35%.  . Gallstones     s/p cholecystectomy    . Sleep apnea     occasionally wears CPAP at night  . CAD (coronary artery disease)   . Aortic root enlargement (HCC)     The aortic root has a diameter measuring 4.6 cm, image 64 of the coronal series.  The ascending  thoracic aorta has a diameter of 3.8 cm, image 65 of the coronal series.  At the level of the aortic arch   the thoracic aorta has a maximum diameter of 3.2 cm.  The descending thoracic aorta has a maximum   diameter of 3.2 cm.  There is no evidence for aortic dissection.       . Iron deficiency anemia   . CHF (congestive heart failure) ([place])   . Obesity, Class III, BMI 40-49.9 (morbid obesity) ([place])   . Heart murmur   . PONV (postoperative nausea and vomiting)   . 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 Diabetes mellitus   . Dyslipidemia 01/14/2013  . Onychomycosis 03/09/2015    Past Surgical History  Procedure Laterality Date  . Cholecystectomy    . Adrenalectomy    . Colonoscopy w/ biopsies and polypectomy  11/04/2008    colon polyps, diverticulosis   . Upper gastrointestinal endoscopy  11/04/2008    w/bx, H pylori gastritis  . Upper gastrointestinal endoscopy  09/25/2011  . Colonoscopy  09/25/2011  . Multiple tooth extractions    . Tee without cardioversion  11/22/2011    Procedure: TRANSESOPHAGEAL ECHOCARDIOGRAM (TEE);  Surgeon: Larey Dresser, MD;  Location: [place];  Service: Cardiovascular;  Laterality: N/A;  . Cardiac catheterization      2013  . Cardiac valve replacement  12/28/2011    Bentall with tissue valve  .  Coronary artery bypass graft  12/28/2011    Procedure: CORONARY ARTERY BYPASS GRAFTING (CABG);  Surgeon: Rexene Alberts, MD;  Location: [place];  Service: Open Heart Surgery;  Laterality: N/A;  cabg x 1  . Bentall procedure  12/28/2011    Procedure: BENTALL PROCEDURE;  Surgeon: Rexene Alberts, MD;  Location: [place];  Service: Open Heart Surgery;  Laterality: N/A;  . Left and right heart catheterization with coronary angiogram N/A 11/22/2011    Procedure: LEFT AND RIGHT HEART CATHETERIZATION WITH CORONARY ANGIOGRAM;  Surgeon: Burnell Blanks, MD;  Location: [facility] CATH LAB;  Service: Cardiovascular;  Laterality: N/A;    Family History  Problem Relation Age of Onset  . Breast cancer Sister   . Diabetes Sister   . Colon cancer Brother 70    at least 81  . Diabetes Brother   . Cancer Brother     colon, prostate  . Prostate cancer Brother   . Diabetes Brother   . Diabetes Sister   .  Cancer Sister     breast, colon, melanoma  . Heart disease Sister   . Hypertension Mother   . Arthritis Mother   . Kidney failure Sister   . Colon cancer Sister   . Breast cancer Sister   . Kidney disease Sister     s/p transplant  . Hypertension Sister   . Diabetes Sister   . Stroke Father   . Hypertension Father     Social History   Social History  . Marital Status: Married    Spouse Name: [name]  . Number of Children: 0  . Years of Education: N/A   Occupational History  .      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 Smoking status: Never Smoker   . Smokeless tobacco: Never Used  . Alcohol Use: No  . Drug Use: No  . Sexual Activity: Not on file     Comment: lives with wife, pastros 2 churches   Other Topics Concern  . Not on file   Social History Narrative   The patient is married, lives with his wife in  [place].  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  He  lives a very sedentary lifestyle.  He is a nonsmoker.  He denies alcohol consumption.     Outpatient Prescriptions Prior to Visit  Medication Sig Dispense Refill  . amLODipine (NORVASC) 10 MG tablet take 1 tablet by mouth once daily 90 tablet 1  . aspirin EC 81 MG tablet Take 81 mg by mouth daily.    . carvedilol (COREG) 25 MG tablet take 1 tablet by mouth twice a day with meals 60 tablet 6  . clobetasol ointment (TEMOVATE) 0.05 % apply to affected area twice a day UPON ITCHING AND IRRITATION(NOT TO FACE OR GROIN)  0  . ferrous sulfate 325 (65 FE) MG tablet Take 325 mg by mouth 2 (two) times daily before a meal.    . furosemide (LASIX) 40 MG tablet Take 1 tablet (40 mg total) by mouth 2 (two) times daily. 180 tablet 3  . Multiple Vitamin (MULITIVITAMIN WITH MINERALS) TABS Take 1 tablet by mouth daily.    Marland Kitchen omeprazole (PRILOSEC) 40 MG capsule take 1 capsule by mouth once daily 30 capsule 2  . potassium chloride SA (K-DUR,KLOR-CON) 20 MEQ tablet Take 2 tablets (40  mEq total) by mouth 3 (three) times daily. 540 tablet 0  . telmisartan (MICARDIS)  80 MG tablet take 1 tablet by mouth once daily 30 tablet 6  . zoster vaccine live, PF, (ZOSTAVAX) 29562 UNT/0.65ML injection Inject 19,400 Units into the skin once. 1 each 0   No facility-administered medications prior to visit.    Allergies  Allergen Reactions  . Latex Hives    Latex gloves     Review of Systems  Constitutional: Negative for fever and malaise/fatigue.  HENT: Negative for congestion.   Eyes: Negative for discharge.  Respiratory: Negative for shortness of breath.   Cardiovascular: Negative for chest pain, palpitations and leg swelling.  Gastrointestinal: Negative for nausea and abdominal pain.  Genitourinary: Negative for dysuria.  Musculoskeletal: Negative for falls.  Skin: Negative for rash.  Neurological: Negative for loss of consciousness and headaches.  Endo/Heme/Allergies: Negative for environmental allergies.  Psychiatric/Behavioral: Negative for depression. The patient is not nervous/anxious.        Objective:    Physical Exam  Constitutional: He is oriented to person, place, and time. He appears well-developed and well-nourished. No distress.  HENT:  Head: Normocephalic and atraumatic.  Nose: Nose normal.  Eyes: Right eye exhibits no discharge. Left eye exhibits no discharge.  Neck: Normal range of motion. Neck supple.  Cardiovascular: Normal rate and regular rhythm.   No murmur heard. Pulmonary/Chest: Effort normal and breath sounds normal.  Abdominal: Soft. Bowel sounds are normal. There is no tenderness.  Musculoskeletal: He exhibits no edema.  Neurological: He is alert and oriented to person, place, and time.  Skin: Skin is warm and dry.  Psychiatric: He has a normal mood and affect.  Nursing note and vitals reviewed.   BP 130/78 mmHg  Pulse 82  Temp(Src) 98.4 F (36.9 C) (Oral)  Ht 6\' 2"  (1.88 m)  Wt 341 lb 8 oz (154.903 kg)  BMI 43.83 kg/m2  SpO2  92% Wt Readings from Last 3 Encounters:  10/26/15 341 lb 8 oz (154.903 kg)  09/01/15 348 lb (157.852 kg)  04/22/15 346 lb 6 oz (157.115 kg)     Lab Results  Component Value Date   WBC 9.0 10/26/2015   HGB 13.9 10/26/2015   HCT 42.7 10/26/2015   PLT 277.0 10/26/2015   GLUCOSE 117* 10/26/2015   CHOL 146 10/26/2015   TRIG 68.0 10/26/2015   HDL 33.70* 10/26/2015   LDLCALC 98 10/26/2015   ALT 11 10/26/2015   AST 12 10/26/2015   NA 145 10/26/2015   K 3.9 10/26/2015   CL 107 10/26/2015   CREATININE 0.87 10/26/2015   BUN 12 10/26/2015   CO2 26 10/26/2015   TSH 1.45 10/26/2015   PSA 7.32* 09/07/2010   INR 1.21 01/02/2012   HGBA1C 5.5 10/26/2015    Lab Results  Component Value Date   TSH 1.45 10/26/2015   Lab Results  Component Value Date   WBC 9.0 10/26/2015   HGB 13.9 10/26/2015   HCT 42.7 10/26/2015   MCV 86.6 10/26/2015   PLT 277.0 10/26/2015   Lab Results  Component Value Date   NA 145 10/26/2015   K 3.9 10/26/2015   CO2 26 10/26/2015   GLUCOSE 117* 10/26/2015   BUN 12 10/26/2015   CREATININE 0.87 10/26/2015   BILITOT 0.8 10/26/2015   ALKPHOS 98 10/26/2015   AST 12 10/26/2015   ALT 11 10/26/2015   PROT 7.0 10/26/2015   ALBUMIN 4.0 10/26/2015   CALCIUM 9.3 10/26/2015   GFR 110.41 10/26/2015   Lab Results  Component Value Date   CHOL 146 10/26/2015   Lab  Results  Component Value Date   HDL 33.70* 10/26/2015   Lab Results  Component Value Date   LDLCALC 98 10/26/2015   Lab Results  Component Value Date   TRIG 68.0 10/26/2015   Lab Results  Component Value Date   CHOLHDL 4 10/26/2015   Lab Results  Component Value Date   HGBA1C 5.5 10/26/2015       Assessment & Plan:   Problem List Items Addressed This Visit    Dyslipidemia   Relevant Orders   TSH (Completed)   Lipid panel (Completed)   Hemoglobin A1c (Completed)   Comprehensive metabolic panel (Completed)   CBC (Completed)   Essential hypertension    Maxzide daily       Relevant Medications   triamterene-hydrochlorothiazide (MAXZIDE-25) 37.5-25 MG tablet   Hyperglycemia     minimize simple carbs. Increase exercise as tolerated.       Relevant Orders   TSH (Completed)   Lipid panel (Completed)   Hemoglobin A1c (Completed)   Comprehensive metabolic panel (Completed)   CBC (Completed)   Hypertensive heart disease without heart failure    Has established with cardiology, no changes to meds at this point      Relevant Medications   triamterene-hydrochlorothiazide (MAXZIDE-25) 37.5-25 MG tablet   Other Relevant Orders   TSH (Completed)   Lipid panel (Completed)   Hemoglobin A1c (Completed)   Comprehensive metabolic panel (Completed)   CBC (Completed)   Iron deficiency anemia    Increase leafy greens, consider increased lean red meat and using cast iron cookware. Continue to monitor, report any concerns      Relevant Orders   TSH (Completed)   Lipid panel (Completed)   Hemoglobin A1c (Completed)   Comprehensive metabolic panel (Completed)   CBC (Completed)   Obesity, Class III, BMI 40-49.9 (morbid obesity) ([place]) - Primary    Encouraged DASH diet, decrease po intake and increase exercise as tolerated. Needs 7-8 hours of sleep nightly. Avoid trans fats, eat small, frequent meals every 4-5 hours with lean proteins, complex carbs and healthy fats. Minimize simple carbs      Relevant Orders   TSH (Completed)   Lipid panel (Completed)   Hemoglobin A1c (Completed)   Comprehensive metabolic panel (Completed)   CBC (Completed)    Other Visit Diagnoses    Need for shingles vaccine        Relevant Medications    zoster vaccine live, PF, (ZOSTAVAX) 16109 UNT/0.65ML injection    Other Relevant Orders    TSH (Completed)    Lipid panel (Completed)    Hemoglobin A1c (Completed)    Comprehensive metabolic panel (Completed)    CBC (Completed)       I am having Mr. Furr start on triamterene-hydrochlorothiazide. I am also having him maintain his  ferrous sulfate, multivitamin with minerals, aspirin EC, omeprazole, clobetasol ointment, telmisartan, carvedilol, furosemide, amLODipine, potassium chloride SA, hydrocortisone, and zoster vaccine live (PF).  Meds ordered this encounter  Medications  . hydrocortisone 2.5 % cream    Sig: Apply 1 application topically 2 (two) times daily.     Refill:  0  . zoster vaccine live, PF, (ZOSTAVAX) 60454 UNT/0.65ML injection    Sig: Inject 19,400 Units into the skin once.    Dispense:  1 each    Refill:  0  . triamterene-hydrochlorothiazide (MAXZIDE-25) 37.5-25 MG tablet    Sig: Take 1 tablet by mouth daily.    Dispense:  30 tablet    Refill:  3  [name], MD

## 2015-10-31 ENCOUNTER — Encounter: Payer: Self-pay | Admitting: Family Medicine

## 2015-10-31 NOTE — Assessment & Plan Note (Signed)
Increase leafy greens, consider increased lean red meat and using cast iron cookware. Continue to monitor, report any concerns 

## 2015-10-31 NOTE — Assessment & Plan Note (Signed)
Encouraged DASH diet, decrease po intake and increase exercise as tolerated. Needs 7-8 hours of sleep nightly. Avoid trans fats, eat small, frequent meals every 4-5 hours with lean proteins, complex carbs and healthy fats. Minimize simple carbs 

## 2015-10-31 NOTE — Assessment & Plan Note (Signed)
Maxzide daily

## 2015-10-31 NOTE — Assessment & Plan Note (Signed)
Has established with cardiology, no changes to meds at this point

## 2015-10-31 NOTE — Assessment & Plan Note (Signed)
minimize simple carbs. Increase exercise as tolerated.  

## 2015-11-03 ENCOUNTER — Ambulatory Visit: Payer: Medicare Other | Admitting: Podiatry

## 2015-11-10 ENCOUNTER — Ambulatory Visit (INDEPENDENT_AMBULATORY_CARE_PROVIDER_SITE_OTHER): Payer: Medicare Other | Admitting: Podiatry

## 2015-11-10 ENCOUNTER — Encounter: Payer: Self-pay | Admitting: Podiatry

## 2015-11-10 DIAGNOSIS — B351 Tinea unguium: Secondary | ICD-10-CM

## 2015-11-10 DIAGNOSIS — M79609 Pain in unspecified limb: Principal | ICD-10-CM

## 2015-11-10 NOTE — Progress Notes (Signed)
Patient ID: David Parsons, male   DOB: [birth year], 75 y.o.   MRN: [id number] Complaint:  Visit Type: Patient returns to my office for continued preventative foot care services. Complaint: Patient states" my nails have grown long and thick and become painful to walk and wear shoes" . He presents for preventative foot care services. No changes to ROS  Podiatric Exam: Vascular: dorsalis pedis and posterior tibial pulses are palpable bilateral. Capillary return is immediate. Temperature gradient is WNL. Skin turgor WNL  Sensorium: Normal Semmes Weinstein monofilament test. Normal tactile sensation bilaterally. Nail Exam: Pt has thick disfigured discolored nails with subungual debris noted bilateral entire nail hallux through fifth toenails Ulcer Exam: There is no evidence of ulcer or pre-ulcerative changes or infection. Orthopedic Exam: Muscle tone and strength are WNL. No limitations in general ROM. No crepitus or effusions noted. Foot type and digits show no abnormalities. Bony prominences are unremarkable. Skin: No Porokeratosis. No infection or ulcers  Diagnosis:  Tinea unguium, Pain in right toe, pain in left toes  Treatment & Plan Procedures and Treatment: Consent by patient was obtained for treatment procedures. The patient understood the discussion of treatment and procedures well. All questions were answered thoroughly reviewed. Debridement of mycotic and hypertrophic toenails, 1 through 5 bilateral and clearing of subungual debris. No ulceration, no infection noted.  Return Visit-Office Procedure: Patient instructed to return to the office for a follow up visit 3 months for continued evaluation and treatment.  Gardiner Barefoot DPM

## 2015-12-01 ENCOUNTER — Ambulatory Visit (INDEPENDENT_AMBULATORY_CARE_PROVIDER_SITE_OTHER): Payer: Medicare Other | Admitting: Medical

## 2015-12-01 ENCOUNTER — Encounter: Payer: Self-pay | Admitting: Medical

## 2015-12-01 VITALS — BP 142/80 | HR 89 | Temp 98.1°F | Ht 74.0 in | Wt 342.6 lb

## 2015-12-01 DIAGNOSIS — I2510 Atherosclerotic heart disease of native coronary artery without angina pectoris: Secondary | ICD-10-CM | POA: Diagnosis not present

## 2015-12-01 DIAGNOSIS — J209 Acute bronchitis, unspecified: Secondary | ICD-10-CM

## 2015-12-01 DIAGNOSIS — J040 Acute laryngitis: Secondary | ICD-10-CM

## 2015-12-01 DIAGNOSIS — R0981 Nasal congestion: Secondary | ICD-10-CM | POA: Diagnosis not present

## 2015-12-01 DIAGNOSIS — R05 Cough: Secondary | ICD-10-CM | POA: Diagnosis not present

## 2015-12-01 DIAGNOSIS — R059 Cough, unspecified: Secondary | ICD-10-CM

## 2015-12-01 MED ORDER — FLUTICASONE PROPIONATE 50 MCG/ACT NA SUSP: 2.0000 | Freq: Every day | NASAL | Status: DC

## 2015-12-01 MED ORDER — AZITHROMYCIN 250 MG PO TABS: ORAL_TABLET | ORAL | Status: DC

## 2015-12-01 MED ORDER — BENZONATATE 100 MG PO CAPS: 100.0000 mg | ORAL_CAPSULE | Freq: Three times a day (TID) | ORAL | Status: DC | PRN

## 2015-12-01 NOTE — Progress Notes (Signed)
Subjective:    Patient ID: David Parsons, male    DOB: 09/04/1940, 75 y.o.   MRN: [id number]  HPI  Pt in with report of hoarse voice. 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 Since then hoarse voice. Pt states some pnd. Also states when he coughs bringing up mucous. Mild chest congestion. No wheezing. Pt wife has bronchtitis.  Pt has never been a smoker.  No sneezing or itching eyes.  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  Pt has tried corcidin otc.    Review of Systems  Constitutional: Negative for fever, chills and fatigue.  HENT: Positive for congestion and voice change. Negative for ear pain, hearing loss, nosebleeds, postnasal drip, sinus pressure, sneezing and tinnitus.   Respiratory: Positive for cough. Negative for choking, chest tightness, shortness of breath and wheezing.   Cardiovascular: Negative for chest pain and palpitations.  Gastrointestinal: Negative for nausea, abdominal pain and constipation.  Musculoskeletal: Negative for myalgias and back pain.  Neurological: Negative for dizziness and light-headedness.  Hematological: Negative for adenopathy. Does not bruise/bleed easily.  Psychiatric/Behavioral: Negative for behavioral problems and confusion.   Past Medical History  Diagnosis Date  . Hypertension   . Aortic insufficiency      02/16/09 Chest CT:  The aortic root has a diameter measuring 4.6 cm, image 64 of the coronal series.  The ascending    thoracic aorta has a diameter of 3.8 cm, image 65 of the coronal series.  At the level of the aortic arch    the thoracic aorta has a maximum diameter of 3.2 cm.  The descending thoracic aorta has a maximum           diameter of 3.2 cm.  There is no evidence for aortic disse        . GERD (gastroesophageal reflux disease)   . Hypokalemia   . Helicobacter pylori gastritis     (Tx Pylera 4/10)  . Cardiomyopathy      01/2009 - Cath - nonobs. dzs.  EF 35%.  . Gallstones     s/p  cholecystectomy    . Sleep apnea     occasionally wears CPAP at night  . CAD (coronary artery disease)   . Aortic root enlargement (HCC)     The aortic root has a diameter measuring 4.6 cm, image 64 of the coronal series.  The ascending  thoracic aorta has a diameter of 3.8 cm, image 65 of the coronal series.  At the level of the aortic arch   the thoracic aorta has a maximum diameter of 3.2 cm.  The descending thoracic aorta has a maximum   diameter of 3.2 cm.  There is no evidence for aortic dissection.       . Iron deficiency anemia   . CHF (congestive heart failure) ([place])   . Obesity, Class III, BMI 40-49.9 (morbid obesity) ([place])   . Heart murmur   . PONV (postoperative nausea and vomiting)   . 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 Diabetes mellitus   . Dyslipidemia 01/14/2013  . Onychomycosis 03/09/2015    Social History   Social History  . Marital Status: Married    Spouse Name: [name]  .  Number of Children: 0  . Years of Education: N/A   Occupational History  .      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 Smoking status: Never Smoker   . Smokeless tobacco: Never Used  . Alcohol Use: No  . Drug Use: No  . Sexual Activity: Not on file     Comment: lives with wife, pastros 2 churches   Other Topics Concern  . Not on file   Social History Narrative   The patient is married, lives with his wife in  [place].  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  He  lives a very sedentary lifestyle.  He is a nonsmoker.  He denies alcohol consumption.     Past Surgical History  Procedure Laterality Date  . Cholecystectomy    . Adrenalectomy    . Colonoscopy w/ biopsies and polypectomy   11/04/2008    colon polyps, diverticulosis   . Upper gastrointestinal endoscopy  11/04/2008    w/bx, H pylori gastritis  . Upper gastrointestinal endoscopy  09/25/2011  . Colonoscopy  09/25/2011  . Multiple tooth extractions    . Tee without cardioversion  11/22/2011    Procedure: TRANSESOPHAGEAL ECHOCARDIOGRAM (TEE);  Surgeon: Larey Dresser, MD;  Location: [place];  Service: Cardiovascular;  Laterality: N/A;  . Cardiac catheterization      2013  . Cardiac valve replacement  12/28/2011    Bentall with tissue valve  . Coronary artery bypass graft  12/28/2011    Procedure: CORONARY ARTERY BYPASS GRAFTING (CABG);  Surgeon: Rexene Alberts, MD;  Location: [place];  Service: Open Heart Surgery;  Laterality: N/A;  cabg x 1  . Bentall procedure  12/28/2011    Procedure: BENTALL PROCEDURE;  Surgeon: Rexene Alberts, MD;  Location: [place];  Service: Open Heart Surgery;  Laterality: N/A;  . Left and right heart catheterization with coronary angiogram N/A 11/22/2011    Procedure: LEFT AND RIGHT HEART CATHETERIZATION WITH CORONARY ANGIOGRAM;  Surgeon: Burnell Blanks, MD;  Location: [facility] CATH LAB;  Service: Cardiovascular;  Laterality: N/A;    Family History  Problem Relation Age of Onset  . Breast cancer Sister   . Diabetes Sister   . Colon cancer Brother 70    at least 39  . Diabetes Brother   . Cancer Brother     colon, prostate  . Prostate cancer Brother   . Diabetes Brother   . Diabetes Sister   . Cancer Sister     breast, colon, melanoma  . Heart disease Sister   . Hypertension Mother   . Arthritis Mother   . Kidney failure Sister   . Colon cancer Sister   . Breast cancer Sister   . Kidney disease Sister     s/p transplant  . Hypertension Sister   . Diabetes Sister   . Stroke Father   . Hypertension Father     Allergies  Allergen Reactions  . Latex Hives    Latex gloves     Current Outpatient Prescriptions on File Prior to Visit  Medication Sig Dispense Refill  .  amLODipine (NORVASC) 10 MG tablet take 1 tablet by mouth once daily 90 tablet 1  . aspirin EC 81 MG tablet Take 81 mg by mouth daily.    . carvedilol (COREG) 25 MG tablet take 1 tablet by mouth twice a day with meals 60 tablet 6  . clobetasol ointment (TEMOVATE) 0.05 %  apply to affected area twice a day UPON ITCHING AND IRRITATION(NOT TO FACE OR GROIN)  0  . ferrous sulfate 325 (65 FE) MG tablet Take 325 mg by mouth 2 (two) times daily before a meal.    . furosemide (LASIX) 40 MG tablet Take 1 tablet (40 mg total) by mouth 2 (two) times daily. 180 tablet 3  . hydrocortisone 2.5 % cream Apply 1 application topically 2 (two) times daily.   0  . Multiple Vitamin (MULITIVITAMIN WITH MINERALS) TABS Take 1 tablet by mouth daily.    Marland Kitchen omeprazole (PRILOSEC) 40 MG capsule take 1 capsule by mouth once daily 30 capsule 2  . potassium chloride SA (K-DUR,KLOR-CON) 20 MEQ tablet Take 2 tablets (40 mEq total) by mouth 3 (three) times daily. 540 tablet 0  . telmisartan (MICARDIS) 80 MG tablet take 1 tablet by mouth once daily 30 tablet 6  . triamterene-hydrochlorothiazide (MAXZIDE-25) 37.5-25 MG tablet Take 1 tablet by mouth daily. 30 tablet 3  . zoster vaccine live, PF, (ZOSTAVAX) 96295 UNT/0.65ML injection Inject 19,400 Units into the skin once. 1 each 0   No current facility-administered medications on file prior to visit.    BP 142/80 mmHg  Pulse 89  Temp(Src) 98.1 F (36.7 C) (Oral)  Ht 6\' 2"  (1.88 m)  Wt 342 lb 9.6 oz (155.402 kg)  BMI 43.97 kg/m2  SpO2 97%       Objective:   Physical Exam  General  Mental Status - Alert. General Appearance - Well groomed. Not in acute distress.  Skin Rashes- No Rashes.  HEENT Head- Normal. Ear Auditory Canal - Left- Normal. Right - Normal.Tympanic Membrane- Left- Normal. Right- Normal. Eye Sclera/Conjunctiva- Left- Normal. Right- Normal. Nose & Sinuses Nasal Mucosa- Left-  Boggy and Congested. Right-  Boggy and  Congested.Bilateral no  maxillary  and no  frontal sinus pressure. Mouth & Throat Lips: Upper Lip- Normal: no dryness, cracking, pallor, cyanosis, or vesicular eruption. Lower Lip-Normal: no dryness, cracking, pallor, cyanosis or vesicular eruption. Buccal Mucosa- Bilateral- No Aphthous ulcers. Oropharynx- No Discharge or Erythema. Tonsils: Characteristics- Bilateral- No Erythema or Congestion. Size/Enlargement- Bilateral- No enlargement. Discharge- bilateral-None.  Neck Neck- Supple. No Masses.   Chest and Lung Exam Auscultation: Breath Sounds:-Clear even and unlabored.  Cardiovascular Auscultation:Rythm- Regular, rate and rhythm. Murmurs & Other Heart Sounds:Ausculatation of the heart reveal- No Murmurs.  Lymphatic Head & Neck General Head & Neck Lymphatics: Bilateral: Description- No Localized lymphadenopathy.       Assessment & Plan:  Rest hydrate tylenol for fever.  For laryngitis rest your voice as much as possible.  Stay hydrated.  For bronchitis will rx azithromycin.   For nasal congestion flonase nasal spray.  Benzonatate for cough.  Follow up in 7 days or as needed.

## 2015-12-01 NOTE — Patient Instructions (Addendum)
Rest hydrate tylenol for fever.  For laryngitis rest your voice as much as possible.  Stay hydrated.  For bronchitis will rx azithromycin.   For nasal congestion flonase nasal spray.  Benzonatate for cough.  Follow up in 7 days or as needed.   Advised in the event hoarseness did not get better by 10 days then may refer to ENT

## 2015-12-01 NOTE — Progress Notes (Signed)
Pre visit review using our clinic review tool, if applicable. No additional management support is needed unless otherwise documented below in the visit note. 

## 2015-12-07 ENCOUNTER — Telehealth: Payer: Self-pay | Admitting: Family Medicine

## 2015-12-07 DIAGNOSIS — J040 Acute laryngitis: Secondary | ICD-10-CM

## 2015-12-07 MED ORDER — AMOXICILLIN 500 MG PO TABS: 500.0000 mg | ORAL_TABLET | Freq: Two times a day (BID) | ORAL | Status: DC

## 2015-12-07 NOTE — Telephone Encounter (Signed)
Pt voice is still hoarse. I wrote him azithromycin on last visit. 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 So this may be why his hoarse voice persists. Not convinced another round of antibiotic needs. If he is insistent then can send in him rx of amoxicillin 500 mg tid x 7 days. But also go ahead and put in referral to ENT for persisting laryngitis. If after 7 days he feels he is completely better with no hoarse voice at all then we can cancel the referral.If hoarse voice persist then keep ent appointment.

## 2015-12-07 NOTE — Telephone Encounter (Signed)
Caller name: Krystopher, Shortsleeve Relation to [phone]  Call back number:[phone] Pharmacy: [facility], [place]  Reason for call:  Patient was last seen 12/01/15 symptoms  fail to improve requesting another script due to patient feeling hoarse. Please advise

## 2015-12-07 NOTE — Telephone Encounter (Signed)
Spoke with pt and advised him of the providers recommendation below and he voices understanding. Pt states that he feels like he will need more antibiotics to clear up the infection. Pt was also advised to keep the appointment for ENT if the Laryngitis continues and if it gets better he can cancel the appointment. Pt voices understanding.

## 2015-12-13 ENCOUNTER — Encounter: Payer: Self-pay | Admitting: Family Medicine

## 2015-12-13 ENCOUNTER — Other Ambulatory Visit: Payer: Self-pay | Admitting: Family Medicine

## 2015-12-15 ENCOUNTER — Telehealth: Payer: Self-pay | Admitting: Family Medicine

## 2015-12-15 NOTE — Telephone Encounter (Signed)
Caller name: [name] Relationship to patient: Wife Can be reached: [phone]     Reason for call: FYI: Wife called to inform provider that patient no longer needs referral to ENT. States that patient is doing much better.

## 2016-01-24 ENCOUNTER — Other Ambulatory Visit: Payer: Self-pay | Admitting: Family Medicine

## 2016-01-25 DIAGNOSIS — H04123 Dry eye syndrome of bilateral lacrimal glands: Secondary | ICD-10-CM | POA: Diagnosis not present

## 2016-01-25 DIAGNOSIS — H25813 Combined forms of age-related cataract, bilateral: Secondary | ICD-10-CM | POA: Diagnosis not present

## 2016-01-25 DIAGNOSIS — H5203 Hypermetropia, bilateral: Secondary | ICD-10-CM | POA: Diagnosis not present

## 2016-01-25 DIAGNOSIS — H524 Presbyopia: Secondary | ICD-10-CM | POA: Diagnosis not present

## 2016-01-25 DIAGNOSIS — H401132 Primary open-angle glaucoma, bilateral, moderate stage: Secondary | ICD-10-CM | POA: Diagnosis not present

## 2016-01-30 ENCOUNTER — Other Ambulatory Visit: Payer: Self-pay | Admitting: Family Medicine

## 2016-02-09 ENCOUNTER — Ambulatory Visit (INDEPENDENT_AMBULATORY_CARE_PROVIDER_SITE_OTHER): Payer: Medicare Other | Admitting: Podiatry

## 2016-02-09 ENCOUNTER — Encounter: Payer: Self-pay | Admitting: Podiatry

## 2016-02-09 DIAGNOSIS — M79676 Pain in unspecified toe(s): Secondary | ICD-10-CM | POA: Diagnosis not present

## 2016-02-09 DIAGNOSIS — B351 Tinea unguium: Secondary | ICD-10-CM | POA: Diagnosis not present

## 2016-02-09 DIAGNOSIS — M79609 Pain in unspecified limb: Principal | ICD-10-CM

## 2016-02-09 NOTE — Progress Notes (Signed)
Patient ID: David Parsons, male   DOB: [birth year], 75 y.o.   MRN: [id number] Complaint:  Visit Type: Patient returns to my office for continued preventative foot care services. Complaint: Patient states" my nails have grown long and thick and become painful to walk and wear shoes" . He presents for preventative foot care services. No changes to ROS  Podiatric Exam: Vascular: dorsalis pedis and posterior tibial pulses are palpable bilateral. Capillary return is immediate. Temperature gradient is WNL. Skin turgor WNL  Sensorium: Normal Semmes Weinstein monofilament test. Normal tactile sensation bilaterally. Nail Exam: Pt has thick disfigured discolored nails with subungual debris noted bilateral entire nail hallux through fifth toenails Ulcer Exam: There is no evidence of ulcer or pre-ulcerative changes or infection. Orthopedic Exam: Muscle tone and strength are WNL. No limitations in general ROM. No crepitus or effusions noted. Foot type and digits show no abnormalities. Bony prominences are unremarkable. Skin: No Porokeratosis. No infection or ulcers  Diagnosis:  Tinea unguium, Pain in right toe, pain in left toes  Treatment & Plan Procedures and Treatment: Consent by patient was obtained for treatment procedures. The patient understood the discussion of treatment and procedures well. All questions were answered thoroughly reviewed. Debridement of mycotic and hypertrophic toenails, 1 through 5 bilateral and clearing of subungual debris. No ulceration, no infection noted.  Return Visit-Office Procedure: Patient instructed to return to the office for a follow up visit 3 months for continued evaluation and treatment.  Gardiner Barefoot DPM

## 2016-02-25 DIAGNOSIS — H04123 Dry eye syndrome of bilateral lacrimal glands: Secondary | ICD-10-CM | POA: Diagnosis not present

## 2016-02-25 DIAGNOSIS — H25813 Combined forms of age-related cataract, bilateral: Secondary | ICD-10-CM | POA: Diagnosis not present

## 2016-02-25 DIAGNOSIS — H401132 Primary open-angle glaucoma, bilateral, moderate stage: Secondary | ICD-10-CM | POA: Diagnosis not present

## 2016-02-25 DIAGNOSIS — H35351 Cystoid macular degeneration, right eye: Secondary | ICD-10-CM | POA: Diagnosis not present

## 2016-02-28 ENCOUNTER — Other Ambulatory Visit: Payer: Self-pay | Admitting: Family Medicine

## 2016-03-05 ENCOUNTER — Other Ambulatory Visit: Payer: Self-pay | Admitting: Family Medicine

## 2016-03-24 DIAGNOSIS — H401132 Primary open-angle glaucoma, bilateral, moderate stage: Secondary | ICD-10-CM | POA: Diagnosis not present

## 2016-03-28 ENCOUNTER — Encounter (INDEPENDENT_AMBULATORY_CARE_PROVIDER_SITE_OTHER): Payer: Medicare Other | Admitting: Ophthalmology

## 2016-03-28 DIAGNOSIS — H43813 Vitreous degeneration, bilateral: Secondary | ICD-10-CM | POA: Diagnosis not present

## 2016-03-28 DIAGNOSIS — I10 Essential (primary) hypertension: Secondary | ICD-10-CM

## 2016-03-28 DIAGNOSIS — H2513 Age-related nuclear cataract, bilateral: Secondary | ICD-10-CM | POA: Diagnosis not present

## 2016-03-28 DIAGNOSIS — H35033 Hypertensive retinopathy, bilateral: Secondary | ICD-10-CM | POA: Diagnosis not present

## 2016-04-09 ENCOUNTER — Other Ambulatory Visit: Payer: Self-pay | Admitting: Family Medicine

## 2016-04-15 ENCOUNTER — Other Ambulatory Visit: Payer: Self-pay | Admitting: Family Medicine

## 2016-04-28 DIAGNOSIS — H04123 Dry eye syndrome of bilateral lacrimal glands: Secondary | ICD-10-CM | POA: Diagnosis not present

## 2016-04-28 DIAGNOSIS — H401132 Primary open-angle glaucoma, bilateral, moderate stage: Secondary | ICD-10-CM | POA: Diagnosis not present

## 2016-05-03 ENCOUNTER — Ambulatory Visit: Payer: Medicare Other | Admitting: Podiatry

## 2016-05-10 ENCOUNTER — Ambulatory Visit (INDEPENDENT_AMBULATORY_CARE_PROVIDER_SITE_OTHER): Payer: Medicare Other | Admitting: Podiatry

## 2016-05-10 ENCOUNTER — Encounter: Payer: Self-pay | Admitting: Podiatry

## 2016-05-10 VITALS — BP 155/83 | HR 73 | Resp 16

## 2016-05-10 DIAGNOSIS — B351 Tinea unguium: Secondary | ICD-10-CM

## 2016-05-10 DIAGNOSIS — H04123 Dry eye syndrome of bilateral lacrimal glands: Secondary | ICD-10-CM | POA: Diagnosis not present

## 2016-05-10 DIAGNOSIS — M79609 Pain in unspecified limb: Principal | ICD-10-CM

## 2016-05-10 DIAGNOSIS — M79676 Pain in unspecified toe(s): Secondary | ICD-10-CM

## 2016-05-10 DIAGNOSIS — H401132 Primary open-angle glaucoma, bilateral, moderate stage: Secondary | ICD-10-CM | POA: Diagnosis not present

## 2016-05-10 NOTE — Progress Notes (Signed)
Patient ID: David Parsons, male   DOB: 06/22/1941, 75 y.o.   MRN: [id number] Complaint:  Visit Type: Patient returns to my office for continued preventative foot care services. Complaint: Patient states" my nails have grown long and thick and become painful to walk and wear shoes" . He presents for preventative foot care services. No changes to ROS  Podiatric Exam: Vascular: dorsalis pedis and posterior tibial pulses are palpable bilateral. Capillary return is immediate. Temperature gradient is WNL. Skin turgor WNL  Sensorium: Normal Semmes Weinstein monofilament test. Normal tactile sensation bilaterally. Nail Exam: Pt has thick disfigured discolored nails with subungual debris noted bilateral entire nail hallux through fifth toenails Ulcer Exam: There is no evidence of ulcer or pre-ulcerative changes or infection. Orthopedic Exam: Muscle tone and strength are WNL. No limitations in general ROM. No crepitus or effusions noted. Foot type and digits show no abnormalities. Bony prominences are unremarkable. Skin: No Porokeratosis. No infection or ulcers  Diagnosis:  Tinea unguium, Pain in right toe, pain in left toes  Treatment & Plan Procedures and Treatment: Consent by patient was obtained for treatment procedures. The patient understood the discussion of treatment and procedures well. All questions were answered thoroughly reviewed. Debridement of mycotic and hypertrophic toenails, 1 through 5 bilateral and clearing of subungual debris. No ulceration, no infection noted.  Return Visit-Office Procedure: Patient instructed to return to the office for a follow up visit 3 months for continued evaluation and treatment.  Khylon Davies DPM 

## 2016-05-17 DIAGNOSIS — Z23 Encounter for immunization: Secondary | ICD-10-CM | POA: Diagnosis not present

## 2016-07-05 DIAGNOSIS — H401132 Primary open-angle glaucoma, bilateral, moderate stage: Secondary | ICD-10-CM | POA: Diagnosis not present

## 2016-07-05 DIAGNOSIS — H04123 Dry eye syndrome of bilateral lacrimal glands: Secondary | ICD-10-CM | POA: Diagnosis not present

## 2016-07-08 ENCOUNTER — Other Ambulatory Visit: Payer: Self-pay | Admitting: Family Medicine

## 2016-07-26 ENCOUNTER — Other Ambulatory Visit: Payer: Self-pay | Admitting: Family Medicine

## 2016-07-26 ENCOUNTER — Telehealth: Payer: Self-pay | Admitting: *Deleted

## 2016-07-26 NOTE — Telephone Encounter (Signed)
AWV scheduled 08/01/16.

## 2016-07-27 ENCOUNTER — Other Ambulatory Visit: Payer: Self-pay | Admitting: Family Medicine

## 2016-07-29 ENCOUNTER — Other Ambulatory Visit: Payer: Self-pay | Admitting: Family Medicine

## 2016-07-31 NOTE — Progress Notes (Signed)
Pre visit review using our clinic review tool, if applicable. No additional management support is needed unless otherwise documented below in the visit note. 

## 2016-07-31 NOTE — Progress Notes (Signed)
Subjective:   David Parsons is a 75 y.o. male who presents for Medicare Annual/Subsequent preventive examination.  Review of Systems:  No ROS.  Medicare Wellness Visit.  Cardiac Risk Factors include: advanced age (>83men, >53 women);dyslipidemia;male gender;hypertension;obesity (BMI >30kg/m2)  Sleep patterns: no sleep issues and falls asleep easily. 6-7 hrs nightly. Does not get up to void.  Home Safety/Smoke Alarms: Feels safe in home. Smoke alarms and carbon monoxide detectors in place. Living environment; residence and Firearm Safety: 1-story house/ trailer. Lives w/ wife. No firearms. Seat Belt Safety/Bike Helmet: Wears seat belt.   Counseling:   Dental- Sees [facility]. Has temporary partial plates on top. Due for follow-up.  Male:   CCS- last 09/25/11 w/ Dr. Silvano Rusk. Diverticulosis.    PSA- Follows w/ Dr. Kathie Rhodes (urology) Lab Results  Component Value Date   PSA 7.32 (H) 09/07/2010        Objective:    Vitals: BP 136/86 (BP Location: Left Arm, Patient Position: Sitting, Cuff Size: Normal)   Pulse 75   Resp 18   Ht 6' (1.829 m)   Wt (!) 340 lb (154.2 kg)   SpO2 98%   BMI 46.11 kg/m   Body mass index is 46.11 kg/m.  Tobacco History  Smoking Status  . Never Smoker  Smokeless Tobacco  . Never Used     Counseling given: Not Answered   Past Medical History:  Diagnosis Date  . Aortic insufficiency     02/16/09 Chest CT:  The aortic root has a diameter measuring 4.6 cm, image 64 of the coronal series.  The ascending    thoracic aorta has a diameter of 3.8 cm, image 65 of the coronal series.  At the level of the aortic arch    the thoracic aorta has a maximum diameter of 3.2 cm.  The descending thoracic aorta has a maximum           diameter of 3.2 cm.  There is no evidence for aortic disse        . Aortic root enlargement (HCC)    The aortic root has a diameter measuring 4.6 cm, image 64 of the coronal series.  The ascending  thoracic aorta has  a diameter of 3.8 cm, image 65 of the coronal series.  At the level of the aortic arch   the thoracic aorta has a maximum diameter of 3.2 cm.  The descending thoracic aorta has a maximum   diameter of 3.2 cm.  There is no evidence for aortic dissection.       Marland Kitchen CAD (coronary artery disease)   . Cardiomyopathy     01/2009 - Cath - nonobs. dzs.  EF 35%.  . CHF (congestive heart failure) ([place])   . Diabetes mellitus   . Dyslipidemia 01/14/2013  . Gallstones    s/p cholecystectomy    . GERD (gastroesophageal reflux disease)   . Heart murmur   . Helicobacter pylori gastritis    (Tx Pylera 4/10)  . Hypertension   . Hypokalemia   . Iron deficiency anemia   . Obesity, Class III, BMI 40-49.9 (morbid obesity) ([place])   . Onychomycosis 03/09/2015  . PONV (postoperative nausea and vomiting)   . 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  Sleep apnea    occasionally wears CPAP at night   Past Surgical History:  Procedure Laterality Date  . ADRENALECTOMY    . BENTALL PROCEDURE  12/28/2011   Procedure: BENTALL PROCEDURE;  Surgeon: Rexene Alberts, MD;  Location: [facility];  Service: Open Heart Surgery;  Laterality: N/A;  . CARDIAC CATHETERIZATION     2013  . CARDIAC VALVE REPLACEMENT  12/28/2011   Bentall with tissue valve  . CHOLECYSTECTOMY    . COLONOSCOPY  09/25/2011  . COLONOSCOPY W/ BIOPSIES AND POLYPECTOMY  11/04/2008   colon polyps, diverticulosis   . CORONARY ARTERY BYPASS GRAFT  12/28/2011   Procedure: CORONARY ARTERY BYPASS GRAFTING (CABG);  Surgeon: Rexene Alberts, MD;  Location: [facility];  Service: Open Heart Surgery;  Laterality: N/A;  cabg x 1  . LEFT AND RIGHT HEART CATHETERIZATION WITH CORONARY ANGIOGRAM N/A 11/22/2011   Procedure: LEFT AND RIGHT HEART  CATHETERIZATION WITH CORONARY ANGIOGRAM;  Surgeon: Burnell Blanks, MD;  Location: [facility] C[facility] LAB;  Service: Cardiovascular;  Laterality: N/A;  . MULTIPLE TOOTH EXTRACTIONS    . TEE WITHOUT CARDIOVERSION  11/22/2011   Procedure: TRANSESOPHAGEAL ECHOCARDIOGRAM (TEE);  Surgeon: Larey Dresser, MD;  Location: [place];  Service: Cardiovascular;  Laterality: N/A;  . UPPER GASTROINTESTINAL ENDOSCOPY  11/04/2008   w/bx, H pylori gastritis  . UPPER GASTROINTESTINAL ENDOSCOPY  09/25/2011   Family History  Problem Relation Age of Onset  . Breast cancer Sister   . Diabetes Sister   . Colon cancer Brother 70    at least 75  . Diabetes Brother   . Cancer Brother     colon, prostate  . Prostate cancer Brother   . Diabetes Brother   . Diabetes Sister   . Cancer Sister     breast, colon, melanoma  . Heart disease Sister   . Hypertension Mother   . Arthritis Mother   . Kidney failure Sister   . Colon cancer Sister   . Breast cancer Sister   . Kidney disease Sister     s/p transplant  . Hypertension Sister   . Diabetes Sister   . Stroke Father   . Hypertension Father    History  Sexual Activity  . 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 amLODipine (NORVASC) 10 MG tablet take 1 tablet by mouth once daily  . aspirin EC 81 MG tablet Take 81 mg by mouth daily.  . benzonatate (TESSALON) 100 MG capsule Take 1 capsule (100 mg total) by mouth 3 (three) times daily as needed for cough.  . carvedilol (COREG) 25 MG tablet take 1 tablet by mouth twice a day with meals  . clobetasol ointment (TEMOVATE) 0.05 % apply to affected area twice a day UPON ITCHING AND IRRITATION(NOT TO FACE OR GROIN)  . ferrous sulfate 325 (65 FE) MG tablet Take 325 mg by mouth 2 (two) times daily before a meal.  . fluticasone (FLONASE) 50 MCG/ACT nasal spray Place 2 sprays into both nostrils daily.  .  furosemide (LASIX) 40 MG tablet Take 1 tablet (40 mg total) by mouth 2 (two) times daily.  . hydrocortisone 2.5 % cream Apply 1 application topically 2 (two) times daily.   . Multiple Vitamin (MULITIVITAMIN WITH MINERALS) TABS Take 1 tablet by mouth daily.  Marland Kitchen omeprazole (PRILOSEC) 40 MG capsule take 1 capsule by mouth once daily  . potassium  chloride SA (K-DUR,KLOR-CON) 20 MEQ tablet take 2 tablets by mouth three times a day  . telmisartan (MICARDIS) 80 MG tablet take 1 tablet by mouth once daily  . triamterene-hydrochlorothiazide (MAXZIDE-25) 37.5-25 MG tablet take 1 tablet by mouth once daily  . [DISCONTINUED] amoxicillin (AMOXIL) 500 MG tablet Take 1 tablet (500 mg total) by mouth 2 (two) times daily. (Patient not taking: Reported on 08/01/2016)  . [DISCONTINUED] carvedilol (COREG) 25 MG tablet take 1 tablet by mouth twice a day with meals  . [DISCONTINUED] zoster vaccine live, PF, (ZOSTAVAX) 29562 UNT/0.65ML injection Inject 19,400 Units into the skin once. (Patient not taking: Reported on 08/01/2016)   No facility-administered encounter medications on file as of 08/01/2016.     Activities of Daily Living In your present state of health, do you have any difficulty performing the following activities: 08/01/2016  Hearing? N  Vision? N  Difficulty concentrating or making decisions? N  Walking or climbing stairs? N  Dressing or bathing? N  Doing errands, shopping? N  Preparing Food and eating ? N  Using the Toilet? N  In the past six months, have you accidently leaked urine? N  Do you have problems with loss of bowel control? Y  Managing your Medications? N  Managing your Finances? N  Housekeeping or managing your Housekeeping? N  Some recent data might be hidden    Patient Care Team: [name], MD as PCP - General (Family Medicine) Burnell Blanks, MD as Consulting Physician (Cardiology) Gatha Mayer, MD as Consulting Physician (Gastroenterology) Druscilla Brownie,  MD as Consulting Physician (Dermatology) Calvert Cantor, MD as Consulting Physician (Ophthalmology) Carolan Clines, MD as Consulting Physician (Urology) Gardiner Barefoot, DPM as Consulting Physician (Podiatry)   Assessment:    Physical assessment deferred to PCP.  Hearing/Vision: Hearing Screening Comments: Able to hear conversational tones w/o difficulty. No issues reported.  Vision Screening Comments: Sees Dr. Posey Pronto [facility]) every 4-5 months and PRN. 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   Exercise Activities and Dietary recommendations Current Exercise Habits: Home exercise routine, Type of exercise: walking;Other - see comments Wellsite geologist), Time (Minutes): 30, Frequency (Times/Week): 7, Weekly Exercise (Minutes/Week): 210, Exercise limited by: None identified  Diet (meal preparation, eat out, water intake, caffeinated beverages, dairy products, fruits and vegetables): on average, 2 meals per day. Eats mostly at home, wife cooks most meals. Tries to have green vegetable w/ every meals. Lots of baked chicken, steak every 3 weeks, sometimes baked salmon. Seldom snacks during the day. Drinks OJ, coffee occasionally. Drinks very little water.   Breakfast: Biscuit from [employer]. Lunch: Varies, usually does not eat much lunch.   Goals    . Increase water intake    . Weight (lb) < 300 lb (136.1 kg)      Fall Risk Fall Risk  08/01/2016 04/22/2015 04/20/2014 05/09/2013  Falls in the past year? No No No No   Depression Screen PHQ 2/9 Scores 08/01/2016 04/22/2015 04/20/2014 05/09/2013  PHQ - 2 Score 0 0 0 0    Cognitive Function MMSE - Mini Mental State Exam 08/01/2016  Orientation to time 5  Orientation to Place 5  Registration 3  Attention/ Calculation 3  Recall 2  Language- name 2 objects 2  Language- repeat 1  Language- follow 3 step command 3  Language- read & follow direction 1  Write a sentence 1  Copy design 1  Total score 27        Immunization  History  Administered Date(s) Administered  . Influenza Split 05/15/2011, 05/22/2012  . Influenza Whole 06/07/2009, 05/11/2010  . Influenza, High Dose Seasonal PF 04/22/2015  . Influenza,inj,Quad PF,36+ Mos 05/09/2013, 04/20/2014  . Influenza-Unspecified 05/17/2016  . Pneumococcal Conjugate-13 05/09/2013  . Pneumococcal Polysaccharide-23 04/16/2006, 04/22/2015  . Td 04/14/2008   Screening Tests Health Maintenance  Topic Date Due  . ZOSTAVAX  01/30/2017 (Originally 02/18/2002)  . TETANUS/TDAP  04/14/2018  . COLONOSCOPY  09/24/2021  . INFLUENZA VACCINE  Completed  . PNA vac Low Risk Adult  Completed      Plan:   Follow-up w/ PCP as scheduled. Continue to eat heart healthy diet (full of fruits, vegetables, whole grains, lean protein, water--limit salt, fat, and sugar intake) and increase physical activity as tolerated. Create your advance directives and bring a copy to your next office visit. Update med list w/ eye drops at next visit.  During the course of the visit the patient was educated and counseled about the following appropriate screening and preventive services:   Vaccines to include Pneumoccal, Influenza, Hepatitis B, Td, Zostavax, HCV  Cardiovascular Disease  Colorectal cancer screening  Diabetes screening  Prostate Cancer Screening  Glaucoma screening  Nutrition counseling   Patient Instructions (the written plan) was given to the patient.    Dorrene German, RN  08/01/2016

## 2016-08-01 ENCOUNTER — Encounter: Payer: Self-pay | Admitting: *Deleted

## 2016-08-01 ENCOUNTER — Ambulatory Visit (INDEPENDENT_AMBULATORY_CARE_PROVIDER_SITE_OTHER): Payer: Medicare Other | Admitting: *Deleted

## 2016-08-01 VITALS — BP 136/86 | HR 75 | Resp 18 | Ht 72.0 in | Wt 340.0 lb

## 2016-08-01 DIAGNOSIS — Z0000 Encounter for general adult medical examination without abnormal findings: Secondary | ICD-10-CM | POA: Diagnosis not present

## 2016-08-01 NOTE — Progress Notes (Signed)
RN AWV check note reviewed. Agree with documention and plan.

## 2016-08-01 NOTE — Patient Instructions (Addendum)
Mr. Lenger , Thank you for taking time to come for your Medicare Wellness Visit. I appreciate your ongoing commitment to your health goals. Please review the following plan we discussed and let me know if I can assist you in the future.   Continue to eat heart healthy diet (full of fruits, vegetables, whole grains, lean protein, water--limit salt, fat, and sugar intake) and increase physical activity as tolerated. Create your advance directives and bring a copy to your next office visit.  These are the goals we discussed: Goals    . Increase water intake    . Weight (lb) < 300 lb (136.1 kg)       This is a list of the screening recommended for you and due dates:  Health Maintenance  Topic Date Due  . Shingles Vaccine  01/30/2017*  . Tetanus Vaccine  04/14/2018  . Colon Cancer Screening  09/24/2021  . Flu Shot  Completed  . Pneumonia vaccines  Completed  *Topic was postponed. The date shown is not the original due date.   Advance Directive Advance directives are the legal documents that allow you to make choices about your health care and medical treatment if you cannot speak for yourself. Advance directives are a way for you to communicate your wishes to family, friends, and health care providers. The specified people can then convey your decisions about end-of-life care to avoid confusion if you should become unable to communicate. Ideally, the process of discussing and writing advance directives should happen over time rather than making decisions all at once. Advance directives can be modified as your situation changes, and you can change your mind at any time, even after you have signed the advance directives. Each state has its own laws regarding advance directives. You may want to check with your health care provider, attorney, or state representative about the law in your state. Below are some examples of advance directives. HEALTH CARE PROXY AND DURABLE POWER OF ATTORNEY FOR HEALTH  CARE A health care proxy is a person (agent) appointed to make medical decisions for you if you cannot. Generally, people choose someone they know well and trust to represent their preferences when they can no longer do so. You should be sure to ask this person for agreement to act as your agent. An agent may have to exercise judgment in the event of a medical decision for which your wishes are not known. A durable power of attorney for health care is a legal document that names your health care proxy. Depending on the laws in your state, after the document is written, it may also need to be:  Signed.  Notarized.  Dated.  Copied.  Witnessed.  Incorporated into your medical record. You may also want to appoint someone to manage your financial affairs if you cannot. This is called a durable power of attorney for finances. It is a separate legal document from the durable power of attorney for health care. You may choose the same person or someone different from your health care proxy to act as your agent in financial matters. LIVING WILL A living will is a set of instructions documenting your wishes about medical care when you cannot care for yourself. It is used if you become:  Terminally ill.  Incapacitated.  Unable to communicate.  Unable to make decisions. Items to consider in your living will include:  The use or non-use of life-sustaining equipment, such as dialysis machines and breathing machines (ventilators).  A do not  resuscitate (DNR) order, which is the instruction not to use cardiopulmonary resuscitation (CPR) if breathing or heartbeat stops.  Tube feeding.  Withholding of food and fluids.  Comfort (palliative) care when the goal becomes comfort rather than a cure.  Organ and tissue donation. A living will does not give instructions about distribution of your money and property if you should pass away. It is advisable to seek the expert advice of a lawyer in drawing  up a will regarding your possessions. Decisions about taxes, beneficiaries, and asset distribution will be legally binding. This process can relieve your family and friends of any burdens surrounding disputes or questions that may come up about the allocation of your assets. DO NOT RESUSCITATE (DNR) A do not resuscitate (DNR) order is a request to not have CPR in the event that your heart stops beating or you stop breathing. Unless given other instructions, a health care provider will try to help any patient whose heart has stopped or who has stopped breathing.  This information is not intended to replace advice given to you by your health care provider. Make sure you discuss any questions you have with your health care provider. Document Released: 11/14/2007 Document Revised: 11/29/2015 Document Reviewed: 12/25/2012 Elsevier Interactive Patient Education  2017 [employer].

## 2016-08-03 ENCOUNTER — Other Ambulatory Visit: Payer: Self-pay | Admitting: Family Medicine

## 2016-08-09 ENCOUNTER — Ambulatory Visit (INDEPENDENT_AMBULATORY_CARE_PROVIDER_SITE_OTHER): Payer: Medicare Other | Admitting: Podiatry

## 2016-08-09 ENCOUNTER — Encounter: Payer: Self-pay | Admitting: Podiatry

## 2016-08-09 VITALS — Ht 74.0 in | Wt 342.0 lb

## 2016-08-09 DIAGNOSIS — B351 Tinea unguium: Secondary | ICD-10-CM

## 2016-08-09 DIAGNOSIS — M79609 Pain in unspecified limb: Secondary | ICD-10-CM | POA: Diagnosis not present

## 2016-08-09 NOTE — Progress Notes (Signed)
Patient ID: David Parsons, male   DOB: 03/29/1941, 75 y.o.   MRN: [id number] Complaint:  Visit Type: Patient returns to my office for continued preventative foot care services. Complaint: Patient states" my nails have grown long and thick and become painful to walk and wear shoes" . He presents for preventative foot care services. No changes to ROS  Podiatric Exam: Vascular: dorsalis pedis and posterior tibial pulses are palpable bilateral. Capillary return is immediate. Temperature gradient is WNL. Skin turgor WNL  Sensorium: Normal Semmes Weinstein monofilament test. Normal tactile sensation bilaterally. Nail Exam: Pt has thick disfigured discolored nails with subungual debris noted bilateral entire nail hallux through fifth toenails Ulcer Exam: There is no evidence of ulcer or pre-ulcerative changes or infection. Orthopedic Exam: Muscle tone and strength are WNL. No limitations in general ROM. No crepitus or effusions noted. Foot type and digits show no abnormalities. Bony prominences are unremarkable. Skin: No Porokeratosis. No infection or ulcers  Diagnosis:  Tinea unguium, Pain in right toe, pain in left toes  Treatment & Plan Procedures and Treatment: Consent by patient was obtained for treatment procedures. The patient understood the discussion of treatment and procedures well. All questions were answered thoroughly reviewed. Debridement of mycotic and hypertrophic toenails, 1 through 5 bilateral and clearing of subungual debris. No ulceration, no infection noted.  Return Visit-Office Procedure: Patient instructed to return to the office for a follow up visit 3 months for continued evaluation and treatment.  Hernan Turnage DPM 

## 2016-08-27 ENCOUNTER — Other Ambulatory Visit: Payer: Self-pay | Admitting: Cardiovascular Disease

## 2016-08-27 DIAGNOSIS — I5033 Acute on chronic diastolic (congestive) heart failure: Secondary | ICD-10-CM

## 2016-09-14 ENCOUNTER — Ambulatory Visit (INDEPENDENT_AMBULATORY_CARE_PROVIDER_SITE_OTHER): Payer: Medicare Other | Admitting: Cardiovascular Disease

## 2016-09-14 ENCOUNTER — Encounter: Payer: Self-pay | Admitting: Cardiovascular Disease

## 2016-09-14 ENCOUNTER — Telehealth: Payer: Self-pay | Admitting: Cardiovascular Disease

## 2016-09-14 VITALS — BP 126/78 | HR 77 | Ht 74.0 in | Wt 336.1 lb

## 2016-09-14 DIAGNOSIS — I2510 Atherosclerotic heart disease of native coronary artery without angina pectoris: Secondary | ICD-10-CM

## 2016-09-14 DIAGNOSIS — I5032 Chronic diastolic (congestive) heart failure: Secondary | ICD-10-CM | POA: Diagnosis not present

## 2016-09-14 DIAGNOSIS — I10 Essential (primary) hypertension: Secondary | ICD-10-CM | POA: Diagnosis not present

## 2016-09-14 DIAGNOSIS — I359 Nonrheumatic aortic valve disorder, unspecified: Secondary | ICD-10-CM | POA: Diagnosis not present

## 2016-09-14 MED ORDER — LOSARTAN POTASSIUM 100 MG PO TABS: 100.0000 mg | ORAL_TABLET | Freq: Every day | ORAL | 11 refills | Status: DC

## 2016-09-14 NOTE — Telephone Encounter (Signed)
I spoke with pt's wife and gave her instructions from Dr. Angelena Form.  Pt has a few days Micardis left so will finish these and start Cozaar on 1/29.  He will come in for BMP on 2/7.  Will send prescription to [facility] in [place].

## 2016-09-14 NOTE — Addendum Note (Signed)
Addended byEarley Favor, Tanush Drees L on: 09/14/2016 12:00 PM   Modules accepted: Orders

## 2016-09-14 NOTE — Patient Instructions (Signed)

## 2016-09-14 NOTE — Progress Notes (Signed)
Chief Complaint  Patient presents with  . 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 He was referred in May 2010 for SOB, abdominal swelling and LE edema. He was diagnosed with a non-ischemic CM, moderate AI, non-obstructive CAD and a dilated aortic root. His aortic root continued to enlarge. I saw him 11/13/11 and arranged a cardiac cath on 11/22/11 which showed enlarged root, no obstructive disease in the left system. I could not engage the RCA secondary to the abnormal takeoff with the enlarged aortic root. CTA was performed and showed dilated aortic root with patent RCA. He underwent aortic root replacement, aortic valve replacement and SVG to RCA on 12/28/11. Weight has been stable on daily Lasix.  Echo April 2016 with normal LV function, mild gradient across the valve.   He is here today for follow up. No chest pain or SOB. No near syncope or syncope. He feels great. No LE edema.   Primary Care Physician: [name], MD  Past Medical History:  Diagnosis Date  . Aortic insufficiency     02/16/09 Chest CT:  The aortic root has a diameter measuring 4.6 cm, image 64 of the coronal series.  The ascending    thoracic aorta has a diameter of 3.8 cm, image 65 of the coronal series.  At the level of the aortic arch    the thoracic aorta has a maximum diameter of 3.2 cm.  The descending thoracic aorta has a maximum           diameter of 3.2 cm.  There is no evidence for aortic disse        . Aortic root enlargement (HCC)    The aortic root has a diameter measuring 4.6 cm, image 64 of the coronal series.  The ascending  thoracic aorta has a diameter of 3.8 cm, image 65 of the coronal series.  At the level of the aortic arch   the thoracic  aorta has a maximum diameter of 3.2 cm.  The descending thoracic aorta has a maximum   diameter of 3.2 cm.  There is no evidence for aortic dissection.       Marland Kitchen CAD (coronary artery disease)   . Cardiomyopathy     01/2009 - Cath - nonobs. dzs.  EF 35%.  . CHF (congestive heart failure) ([place])   . Diabetes mellitus   . Dyslipidemia 01/14/2013  . Gallstones    s/p cholecystectomy    . GERD (gastroesophageal reflux disease)   . Heart murmur   . Helicobacter pylori gastritis    ([place] 4/10)  . Hypertension   . Hypokalemia   . Iron deficiency anemia   . Obesity, Class III, BMI 40-49.9 (morbid obesity) ([place])   . Onychomycosis 03/09/2015  . PONV (postoperative nausea and vomiting)   . 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 Sleep apnea    occasionally wears CPAP at night    Past Surgical History:  Procedure Laterality Date  . ADRENALECTOMY    .  BENTALL PROCEDURE  12/28/2011   Procedure: BENTALL PROCEDURE;  Surgeon: Rexene Alberts, MD;  Location: [place];  Service: Open Heart Surgery;  Laterality: N/A;  . CARDIAC CATHETERIZATION     2013  . CARDIAC VALVE REPLACEMENT  12/28/2011   Bentall with tissue valve  . CHOLECYSTECTOMY    . COLONOSCOPY  09/25/2011  . COLONOSCOPY W/ BIOPSIES AND POLYPECTOMY  11/04/2008   colon polyps, diverticulosis   . CORONARY ARTERY BYPASS GRAFT  12/28/2011   Procedure: CORONARY ARTERY BYPASS GRAFTING (CABG);  Surgeon: Rexene Alberts, MD;  Location: [place];  Service: Open Heart Surgery;  Laterality: N/A;  cabg x 1  . LEFT AND RIGHT HEART CATHETERIZATION WITH CORONARY ANGIOGRAM N/A 11/22/2011   Procedure: LEFT AND RIGHT HEART CATHETERIZATION WITH CORONARY ANGIOGRAM;  Surgeon: Burnell Blanks, MD;  [facility] CATH  LAB;  Service: Cardiovascular;  Laterality: N/A;  . MULTIPLE TOOTH EXTRACTIONS    . TEE WITHOUT CARDIOVERSION  11/22/2011   Procedure: TRANSESOPHAGEAL ECHOCARDIOGRAM (TEE);  Surgeon: Larey Dresser, MD;  Location: [place];  Service: Cardiovascular;  Laterality: N/A;  . UPPER GASTROINTESTINAL ENDOSCOPY  11/04/2008   w/bx, H pylori gastritis  . UPPER GASTROINTESTINAL ENDOSCOPY  09/25/2011    Current Outpatient Prescriptions  Medication Sig Dispense Refill  . amLODipine (NORVASC) 10 MG tablet take 1 tablet by mouth once daily 90 tablet 1  . aspirin EC 81 MG tablet Take 81 mg by mouth daily.    . benzonatate (TESSALON) 100 MG capsule Take 1 capsule (100 mg total) by mouth 3 (three) times daily as needed for cough. 21 capsule 0  . carvedilol (COREG) 25 MG tablet take 1 tablet by mouth twice a day with meals 60 tablet 6  . clobetasol ointment (TEMOVATE) 0.05 % apply to affected area twice a day UPON ITCHING AND IRRITATION(NOT TO FACE OR GROIN)  0  . ferrous sulfate 325 (65 FE) MG tablet Take 325 mg by mouth 2 (two) times daily before a meal.    . fluticasone (FLONASE) 50 MCG/ACT nasal spray Place 2 sprays into both nostrils daily. 16 g 1  . furosemide (LASIX) 40 MG tablet TAKE 1 TABLET BY MOUTH TWICE DAILY 180 tablet 0  . hydrocortisone 2.5 % cream Apply 1 application topically 2 (two) times daily.   0  . Multiple Vitamin (MULITIVITAMIN WITH MINERALS) TABS Take 1 tablet by mouth daily.    Marland Kitchen omeprazole (PRILOSEC) 40 MG capsule take 1 capsule by mouth once daily 30 capsule 2  . potassium chloride SA (K-DUR,KLOR-CON) 20 MEQ tablet take 2 tablets by mouth three times a day 540 tablet 0  . telmisartan (MICARDIS) 80 MG tablet take 1 tablet by mouth once daily 30 tablet 6  . triamterene-hydrochlorothiazide (MAXZIDE-25) 37.5-25 MG tablet take 1 tablet by mouth once daily 30 tablet 3   No current facility-administered medications for this visit.     Allergies  Allergen Reactions  . Latex Hives     Latex gloves     Social History   Social History  . Marital status: Married    Spouse name: [name]  . Number of children: 0  . Years of education: N/A   Occupational History  .  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 Smoking status: Never Smoker  . Smokeless tobacco: Never Used  . Alcohol use No  . Drug use: No  . Sexual activity: Not Currently  Comment: lives with wife, [name] churches   Other Topics Concern  . Not on file   Social History Narrative   The patient is married, lives with his wife in  [place].  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  He  lives a very sedentary lifestyle.  He is a nonsmoker.  He denies alcohol consumption.     Family History  Problem Relation Age of Onset  . Breast cancer Sister   . Diabetes Sister   . Colon cancer Brother 70    at least 2  . Diabetes Brother   . Cancer Brother     colon, prostate  . Prostate cancer Brother   . Diabetes Brother   . Diabetes Sister   . Cancer Sister     breast, colon, melanoma  . Heart disease Sister   . Hypertension Mother   . Arthritis Mother   . Kidney failure Sister   . Colon cancer Sister   . Breast cancer Sister   . Kidney disease Sister     s/p transplant  . Hypertension Sister   . Diabetes Sister   . Stroke Father   . Hypertension Father     Review of Systems:  As stated in the HPI and otherwise negative.   BP 126/78   Pulse 77   Ht 6\' 2"  (1.88 m)   Wt (!) 336 lb 1.9 oz (152.5 kg)   BMI 43.16 kg/m   Physical Examination: General: Well developed, well nourished, NAD  HEENT: OP clear, mucus membranes moist  SKIN: warm, dry. No rashes. Neuro: No focal deficits  Musculoskeletal: Muscle strength 5/5 all ext  Psychiatric: Mood and affect normal  Neck: No JVD, no carotid bruits, no thyromegaly, no lymphadenopathy.  Lungs:Clear bilaterally, no wheezes, rhonci, crackles Cardiovascular: Regular rate and  rhythm. Systolic murmur. No gallops or rubs. Abdomen:Soft. Bowel sounds present. Non-tender.  Extremities: Trace bilateral lower extremity edema. Pulses are 2 + in the bilateral DP/PT.  Echo 12/16/14: Left ventricle: The cavity size was normal. Wall thickness was increased in a pattern of moderate LVH. Systolic function was normal. The estimated ejection fraction was in the range of 60% to 65%. Although no diagnostic regional wall motion abnormality was identified, this possibility cannot be completely excluded on the basis of this study. Doppler parameters are consistent with abnormal left ventricular relaxation (grade 1 diastolic dysfunction). - Ventricular septum: Mild D-shape to the interventricular septum, suggestive of RV pressure/volume overload. - Aortic valve: Bioprosthetic aortic valve is present. The gradient across the bioprosthetic aortic valve was not significantly elevated. There was no regurgitation. Mean gradient (S): 14 mm Hg. - Aorta: Mildly dilated aortic root. Aortic root dimension: 38 mm (ED). - Mitral valve: Mildly calcified annulus. Mildly calcified leaflets . There was no significant regurgitation. - Left atrium: The atrium was mildly dilated. - Right ventricle: Poorly visualized. The cavity size was mildly dilated. Systolic function was mildly reduced. - Right atrium: The atrium was mildly dilated. - Pulmonary arteries: No complete TR doppler jet so unable to estimate PA systolic pressure. - Inferior vena cava: The vessel was normal in size. The respirophasic diameter changes were in the normal range (>= 50%), consistent with normal central venous pressure.  Impressions:  - Technically difficult study with poor acoustic windows. Normal LV size with moderate LV hypertrophy. EF 60-65%. Mildly D-shaped interventricular septum, suggestive of RV pressure/volume overload. The RV was poorly visualized but appeared  mildly dilated with mildly decreased systolic function. Bioprosthetic  aortic valve appeared to function normally.   EKG:  EKG is ordered today. The ekg ordered today demonstrates NSR, rate 77 bpm. RBBB  Recent Labs: 10/26/2015: ALT 11; BUN 12; Creatinine, Ser 0.87; Hemoglobin 13.9; Platelets 277.0; Potassium 3.9; Sodium 145; TSH 1.45   Lipid Panel    Component Value Date/Time   CHOL 146 10/26/2015 0857   TRIG 68.0 10/26/2015 0857   HDL 33.70 (L) 10/26/2015 0857   CHOLHDL 4 10/26/2015 0857   VLDL 13.6 10/26/2015 0857   LDLCALC 98 10/26/2015 0857     Wt Readings from Last 3 Encounters:  09/14/16 (!) 336 lb 1.9 oz (152.5 kg)  08/09/16 (!) 342 lb (155.1 kg)  08/01/16 (!) 340 lb (154.2 kg)     Other studies Reviewed: Additional studies/ records that were reviewed today include: . Review of the above records demonstrates:   Assessment and Plan:   1. CAD, NATIVE VESSEL: S/p single vessel bypass to RCA. No chest pain. Continue ASA and beta blocker.   2. Chronic diastolic CHF NYHA class 1: Weight is stable. Continue Lasix 40 mg BID.   3. HYPERTENSION: BP controlled. Managed in primary care. Will continue Coreg, Norvasc, Maxzide and Micardis.    4. S/P aortic valve replacement and aortoplasty: Stable. LV function normal by echo April 2016. AVR working well with mild expected gradient across valve.      Current medicines are reviewed at length with the patient today.  The patient does not have concerns regarding medicines.  The following changes have been made:  no change  Labs/ tests ordered today include: Echo  Orders Placed This Encounter  Procedures  . EKG 12-Lead    Disposition:   FU with me in 12 months  Signed, Lauree Chandler, MD 09/14/2016 9:58 AM    [facility] [address], [place], [place]  [address] Phone: [phone]; Fax: [phone]

## 2016-09-14 NOTE — Telephone Encounter (Signed)
We can change to Cozaar 100 mg daily and stop the MIcardis. BMET in 10 days. cdm

## 2016-09-14 NOTE — Telephone Encounter (Signed)
Mrs. Pagano is calling to see if they can get a different medication than the Telmisartan . The cost of the medication is expensive and wants to get something that is more cost efficient . Please call   Thanks

## 2016-09-19 ENCOUNTER — Ambulatory Visit: Payer: Medicare Other | Admitting: Family Medicine

## 2016-09-27 ENCOUNTER — Other Ambulatory Visit: Payer: Medicare Other | Admitting: *Deleted

## 2016-09-27 DIAGNOSIS — I10 Essential (primary) hypertension: Secondary | ICD-10-CM | POA: Diagnosis not present

## 2016-09-27 LAB — BASIC METABOLIC PANEL
BUN / CREAT RATIO: 14 (ref 10–24)
BUN: 14 mg/dL (ref 8–27)
CHLORIDE: 95 mmol/L — AB (ref 96–106)
CO2: 27 mmol/L (ref 18–29)
Calcium: 9.2 mg/dL (ref 8.6–10.2)
Creatinine, Ser: 0.98 mg/dL (ref 0.76–1.27)
GFR calc non Af Amer: 76 mL/min/{1.73_m2} (ref >59)
GFR, EST AFRICAN AMERICAN: 87 mL/min/{1.73_m2} (ref >59)
GLUCOSE: 107 mg/dL — AB (ref 65–99)
POTASSIUM: 3.2 mmol/L — AB (ref 3.5–5.2)
SODIUM: 137 mmol/L (ref 134–144)

## 2016-09-28 ENCOUNTER — Ambulatory Visit (INDEPENDENT_AMBULATORY_CARE_PROVIDER_SITE_OTHER): Payer: Medicare Other | Admitting: Ophthalmology

## 2016-09-28 DIAGNOSIS — I10 Essential (primary) hypertension: Secondary | ICD-10-CM

## 2016-09-28 DIAGNOSIS — H2513 Age-related nuclear cataract, bilateral: Secondary | ICD-10-CM | POA: Diagnosis not present

## 2016-09-28 DIAGNOSIS — H35033 Hypertensive retinopathy, bilateral: Secondary | ICD-10-CM

## 2016-09-28 DIAGNOSIS — H43813 Vitreous degeneration, bilateral: Secondary | ICD-10-CM

## 2016-09-28 DIAGNOSIS — H34831 Tributary (branch) retinal vein occlusion, right eye, with macular edema: Secondary | ICD-10-CM

## 2016-10-05 ENCOUNTER — Other Ambulatory Visit: Payer: Self-pay | Admitting: Family Medicine

## 2016-10-05 ENCOUNTER — Encounter: Payer: Self-pay | Admitting: Family Medicine

## 2016-10-05 ENCOUNTER — Ambulatory Visit (INDEPENDENT_AMBULATORY_CARE_PROVIDER_SITE_OTHER): Payer: Medicare Other | Admitting: Family Medicine

## 2016-10-05 VITALS — BP 140/78 | HR 79 | Temp 98.0°F | Wt 330.2 lb

## 2016-10-05 DIAGNOSIS — I10 Essential (primary) hypertension: Secondary | ICD-10-CM

## 2016-10-05 DIAGNOSIS — I2510 Atherosclerotic heart disease of native coronary artery without angina pectoris: Secondary | ICD-10-CM

## 2016-10-05 DIAGNOSIS — E785 Hyperlipidemia, unspecified: Secondary | ICD-10-CM | POA: Diagnosis not present

## 2016-10-05 DIAGNOSIS — E876 Hypokalemia: Secondary | ICD-10-CM

## 2016-10-05 DIAGNOSIS — R972 Elevated prostate specific antigen [PSA]: Secondary | ICD-10-CM | POA: Diagnosis not present

## 2016-10-05 DIAGNOSIS — E66813 Obesity, class 3: Secondary | ICD-10-CM

## 2016-10-05 DIAGNOSIS — R739 Hyperglycemia, unspecified: Secondary | ICD-10-CM

## 2016-10-05 DIAGNOSIS — L603 Nail dystrophy: Secondary | ICD-10-CM

## 2016-10-05 DIAGNOSIS — H409 Unspecified glaucoma: Secondary | ICD-10-CM | POA: Diagnosis not present

## 2016-10-05 HISTORY — DX: Unspecified glaucoma: H40.9

## 2016-10-05 LAB — CBC
HCT: 43.4 % (ref 39.0–52.0)
Hemoglobin: 14.5 g/dL (ref 13.0–17.0)
MCHC: 33.4 g/dL (ref 30.0–36.0)
MCV: 88.8 fl (ref 78.0–100.0)
Platelets: 267 10*3/uL (ref 150.0–400.0)
RBC: 4.89 Mil/uL (ref 4.22–5.81)
RDW: 13.5 % (ref 11.5–15.5)
WBC: 8.6 10*3/uL (ref 4.0–10.5)

## 2016-10-05 LAB — COMPREHENSIVE METABOLIC PANEL
ALBUMIN: 4.2 g/dL (ref 3.5–5.2)
ALK PHOS: 79 U/L (ref 39–117)
ALT: 16 U/L (ref 0–53)
AST: 17 U/L (ref 0–37)
BILIRUBIN TOTAL: 1.2 mg/dL (ref 0.2–1.2)
BUN: 18 mg/dL (ref 6–23)
CALCIUM: 9.7 mg/dL (ref 8.4–10.5)
CO2: 34 mEq/L — ABNORMAL HIGH (ref 19–32)
Chloride: 98 mEq/L (ref 96–112)
Creatinine, Ser: 1.2 mg/dL (ref 0.40–1.50)
GFR: 75.98 mL/min (ref >60.00)
GLUCOSE: 141 mg/dL — AB (ref 70–99)
Potassium: 3.3 mEq/L — ABNORMAL LOW (ref 3.5–5.1)
Sodium: 140 mEq/L (ref 135–145)
Total Protein: 7.5 g/dL (ref 6.0–8.3)

## 2016-10-05 LAB — HEMOGLOBIN A1C: Hgb A1c MFr Bld: 5.7 % (ref 4.6–6.5)

## 2016-10-05 LAB — TSH: TSH: 1.88 u[IU]/mL (ref 0.35–4.50)

## 2016-10-05 LAB — LIPID PANEL
CHOLESTEROL: 162 mg/dL (ref 0–200)
HDL: 36.4 mg/dL — AB (ref >39.00)
LDL Cholesterol: 97 mg/dL (ref 0–99)
NONHDL: 125.27
TRIGLYCERIDES: 139 mg/dL (ref 0.0–149.0)
Total CHOL/HDL Ratio: 4
VLDL: 27.8 mg/dL (ref 0.0–40.0)

## 2016-10-05 MED ORDER — POTASSIUM CHLORIDE CRYS ER 20 MEQ PO TBCR: EXTENDED_RELEASE_TABLET | ORAL | 0 refills | Status: DC

## 2016-10-05 NOTE — Assessment & Plan Note (Signed)
Encouraged DASH diet, decrease po intake and increase exercise as tolerated. Needs 7-8 hours of sleep nightly. Avoid trans fats, eat small, frequent meals every 4-5 hours with lean proteins, complex carbs and healthy fats. Minimize simple carbs. Referred to bariatric program for further consideration

## 2016-10-05 NOTE — Assessment & Plan Note (Signed)
hgba1c acceptable, minimize simple carbs. Increase exercise as tolerated.  

## 2016-10-05 NOTE — Assessment & Plan Note (Signed)
Following with DPM Dr Neomia Dear. Doing well

## 2016-10-05 NOTE — Assessment & Plan Note (Signed)
Took the elevated dose of potassium as directed by cardiology then returned to his 2 tabs tid will check level today to reevaluate. He is asymptomatic

## 2016-10-05 NOTE — Assessment & Plan Note (Addendum)
Asymptomatic does not even have to get up at night, had a complete work up with urology and no concerns identified will continue watchful waiting

## 2016-10-05 NOTE — Assessment & Plan Note (Signed)
Well controlled, no changes to meds. Encouraged heart healthy diet such as the DASH diet and exercise as tolerated.  °

## 2016-10-05 NOTE — Patient Instructions (Signed)
Carbohydrate Counting for Diabetes Mellitus, Adult Carbohydrate counting is a method for keeping track of how many carbohydrates you eat. Eating carbohydrates naturally increases the amount of sugar (glucose) in the blood. Counting how many carbohydrates you eat helps keep your blood glucose within normal limits, which helps you manage your diabetes (diabetes mellitus). It is important to know how many carbohydrates you can safely have in each meal. This is different for every person. A diet and nutrition specialist (registered dietitian) can help you make a meal plan and calculate how many carbohydrates you should have at each meal and snack. Carbohydrates are found in the following foods:  Grains, such as breads and cereals.  Dried beans and soy products.  Starchy vegetables, such as potatoes, peas, and corn.  Fruit and fruit juices.  Milk and yogurt.  Sweets and snack foods, such as cake, cookies, candy, chips, and soft drinks. How do I count carbohydrates? There are two ways to count carbohydrates in food. You can use either of the methods or a combination of both. Reading "Nutrition Facts" on packaged food  The "Nutrition Facts" list is included on the labels of almost all packaged foods and beverages in the [place] It includes:  The serving size.  Information about nutrients in each serving, including the grams (g) of carbohydrate per serving. To use the "Nutrition Facts":  Decide how many servings you will have.  Multiply the number of servings by the number of carbohydrates per serving.  The resulting number is the total amount of carbohydrates that you will be having. Learning standard serving sizes of other foods  When you eat foods containing carbohydrates that are not packaged or do not include "Nutrition Facts" on the label, you need to measure the servings in order to count the amount of carbohydrates:  Measure the foods that you will eat with a food scale or measuring  cup, if needed.  Decide how many standard-size servings you will eat.  Multiply the number of servings by 15. Most carbohydrate-rich foods have about 15 g of carbohydrates per serving.  For example, if you eat 8 oz (170 g) of strawberries, you will have eaten 2 servings and 30 g of carbohydrates (2 servings x 15 g = 30 g).  For foods that have more than one food mixed, such as soups and casseroles, you must count the carbohydrates in each food that is included. The following list contains standard serving sizes of common carbohydrate-rich foods. Each of these servings has about 15 g of carbohydrates:   hamburger bun or  English muffin.   oz (15 mL) syrup.   oz (14 g) jelly.  1 slice of bread.  1 six-inch tortilla.  3 oz (85 g) cooked rice or pasta.  4 oz (113 g) cooked dried beans.  4 oz (113 g) starchy vegetable, such as peas, corn, or potatoes.  4 oz (113 g) hot cereal.  4 oz (113 g) mashed potatoes or  of a large baked potato.  4 oz (113 g) canned or frozen fruit.  4 oz (120 mL) fruit juice.  4-6 crackers.  6 chicken nuggets.  6 oz (170 g) unsweetened dry cereal.  6 oz (170 g) plain fat-free yogurt or yogurt sweetened with artificial sweeteners.  8 oz (240 mL) milk.  8 oz (170 g) fresh fruit or one small piece of fruit.  24 oz (680 g) popped popcorn. Example of carbohydrate counting Sample meal  3 oz (85 g) chicken breast.  6 oz (  170 g) brown rice.  4 oz (113 g) corn.  8 oz (240 mL) milk.  8 oz (170 g) strawberries with sugar-free whipped topping. Carbohydrate calculation 1. Identify the foods that contain carbohydrates:  Rice.  Corn.  Milk.  Strawberries. 2. Calculate how many servings you have of each food:  2 servings rice.  1 serving corn.  1 serving milk.  1 serving strawberries. 3. Multiply each number of servings by 15 g:  2 servings rice x 15 g = 30 g.  1 serving corn x 15 g = 15 g.  1 serving milk x 15 g = 15  g.  1 serving strawberries x 15 g = 15 g. 4. Add together all of the amounts to find the total grams of carbohydrates eaten:  30 g + 15 g + 15 g + 15 g = 75 g of carbohydrates total. This information is not intended to replace advice given to you by your health care provider. Make sure you discuss any questions you have with your health care provider. Document Released: 08/07/2005 Document Revised: 02/25/2016 Document Reviewed: 01/19/2016 Elsevier Interactive Patient Education  2017 [employer]  

## 2016-10-05 NOTE — Progress Notes (Signed)
Patient ID: David Parsons, male   DOB: [birth year], 76 y.o.   MRN: [id number]   Subjective:    Patient ID: David Parsons, male    DOB: 1940-12-29, 76 y.o.   MRN: [id number]  Chief Complaint  Patient presents with  . Follow-up  . Hypertension   I acted as a Education administrator for Dr. Charlett Blake. Princess, RMA  Hypertension  This is a chronic problem. Pertinent negatives include no blurred vision, chest pain, headaches, malaise/fatigue, palpitations or shortness of breath.    Patient is in today for follow up for hypertension and other chronic medical concerns. Patient has no acute concerns. He feels well today. Has been seen by opthamology and diagnosed with Glaucoma and it sounds like retinopathy. Will request records to confirm. He is now seeing [facility] and Dr Zigmund Daniel a retinal specialist. He denies any visual concerns today. Denies CP/palp/SOB/HA/congestion/fevers/GI or GU c/o. Taking meds as prescribed Past Medical History:  Diagnosis Date  . Aortic insufficiency     02/16/09 Chest CT:  The aortic root has a diameter measuring 4.6 cm, image 64 of the coronal series.  The ascending    thoracic aorta has a diameter of 3.8 cm, image 65 of the coronal series.  At the level of the aortic arch    the thoracic aorta has a maximum diameter of 3.2 cm.  The descending thoracic aorta has a maximum           diameter of 3.2 cm.  There is no evidence for aortic disse        . Aortic root enlargement (HCC)    The aortic root has a diameter measuring 4.6 cm, image 64 of the coronal series.  The ascending  thoracic aorta has a diameter of 3.8 cm, image 65 of the coronal series.  At the level of the aortic arch   the thoracic aorta has a maximum diameter of 3.2 cm.  The descending thoracic aorta has a maximum   diameter of 3.2 cm.  There is no evidence for aortic dissection.       Marland Kitchen CAD (coronary artery disease)   . Cardiomyopathy     01/2009 - Cath - nonobs. dzs.  EF 35%.  . CHF (congestive heart failure) ([place])   .  Diabetes mellitus   . Dyslipidemia 01/14/2013  . Gallstones    s/p cholecystectomy    . GERD (gastroesophageal reflux disease)   . Glaucoma 10/05/2016  . Heart murmur   . Helicobacter pylori gastritis    (Tx Pyl[place] 4/10)  . Hypertension   . Hypokalemia   . Iron deficiency anemia   . Obesity, Class III, BMI 40-49.9 (morbid obesity) ([place])   . Onychomycosis 03/09/2015  . PONV (postoperative nausea and vomiting)   . 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 Sleep apnea    occasionally wears CPAP at night    Past Surgical History:  Procedure Laterality Date  . ADRENALECTOMY    . BENTALL PROCEDURE  12/28/2011   Procedure: BENTALL PROCEDURE;  Surgeon: Rexene Alberts, MD;  Location: [place];  Service: Open Heart Surgery;  Laterality: N/A;  . CARDIAC CATHETERIZATION     2013  . CARDIAC VALVE REPLACEMENT  12/28/2011   Bentall with tissue valve  .  CHOLECYSTECTOMY    . COLONOSCOPY  09/25/2011  . COLONOSCOPY W/ BIOPSIES AND POLYPECTOMY  11/04/2008   colon polyps, diverticulosis   . CORONARY ARTERY BYPASS GRAFT  12/28/2011   Procedure: CORONARY ARTERY BYPASS GRAFTING (CABG);  Surgeon: Rexene Alberts, MD;  Location: [place];  Service: Open Heart Surgery;  Laterality: N/A;  cabg x 1  . LEFT AND RIGHT HEART CATHETERIZATION WITH CORONARY ANGIOGRAM N/A 11/22/2011   Procedure: LEFT AND RIGHT HEART CATHETERIZATION WITH CORONARY ANGIOGRAM;  Surgeon: Burnell Blanks, MD;  Location: [facility] CATH LAB;  Service: Cardiovascular;  Laterality: N/A;  . MULTIPLE TOOTH EXTRACTIONS    . TEE WITHOUT CARDIOVERSION  11/22/2011   Procedure: TRANSESOPHAGEAL ECHOCARDIOGRAM (TEE);  Surgeon: Larey Dresser, MD;  Location: [place];  Service: Cardiovascular;  Laterality: N/A;  .  UPPER GASTROINTESTINAL ENDOSCOPY  11/04/2008   w/bx, H pylori gastritis  . UPPER GASTROINTESTINAL ENDOSCOPY  09/25/2011    Family History  Problem Relation Age of Onset  . Breast cancer Sister   . Diabetes Sister   . Colon cancer Brother 70    at least 41  . Diabetes Brother   . Cancer Brother     colon, prostate  . Prostate cancer Brother   . Diabetes Brother   . Diabetes Sister   . Cancer Sister     breast, colon, melanoma  . Heart disease Sister   . Hypertension Mother   . Arthritis Mother   . Kidney failure Sister   . Colon cancer Sister   . Breast cancer Sister   . Kidney disease Sister     s/p transplant  . Hypertension Sister   . Diabetes Sister   . Stroke Father   . Hypertension Father     Social History   Social History  . Marital status: Married    Spouse name: [name]  . Number of children: 0  . Years of education: N/A   Occupational History  .  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 Smoking status: Never Smoker  . Smokeless tobacco: Never Used  . Alcohol use No  . Drug use: No  . Sexual activity: Not Currently     Comment: lives with wife, pastros 2 churches   Other Topics Concern  . Not on file   Social History Narrative   The patient is married, lives with his wife in  [place].  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  He  lives a very sedentary lifestyle.  He is a nonsmoker.  He denies alcohol consumption.     Outpatient Medications Prior to Visit  Medication Sig Dispense Refill  . amLODipine (NORVASC) 10 MG tablet take 1 tablet by mouth once daily 90 tablet 1  . aspirin EC 81 MG tablet Take 81 mg by mouth daily.    . benzonatate (TESSALON) 100 MG capsule Take 1 capsule (100 mg total) by mouth 3 (three) times daily as needed for cough. 21 capsule 0  . carvedilol (COREG) 25 MG tablet take 1 tablet by mouth twice a day with meals 60 tablet 6  . clobetasol ointment (TEMOVATE)  0.05 % apply to affected area twice a day UPON ITCHING AND IRRITATION(NOT TO FACE OR GROIN)  0  . ferrous sulfate 325 (65 FE) MG tablet Take 325 mg by mouth 2 (two) times daily before a meal.    . fluticasone (FLONASE) 50 MCG/ACT nasal  spray Place 2 sprays into both nostrils daily. 16 g 1  . furosemide (LASIX) 40 MG tablet TAKE 1 TABLET BY MOUTH TWICE DAILY 180 tablet 0  . hydrocortisone 2.5 % cream Apply 1 application topically 2 (two) times daily.   0  . losartan (COZAAR) 100 MG tablet Take 1 tablet (100 mg total) by mouth daily. 30 tablet 11  . Multiple Vitamin (MULITIVITAMIN WITH MINERALS) TABS Take 1 tablet by mouth daily.    Marland Kitchen omeprazole (PRILOSEC) 40 MG capsule take 1 capsule by mouth once daily 30 capsule 2  . potassium chloride SA (K-DUR,KLOR-CON) 20 MEQ tablet take 2 tablets by mouth three times a day 540 tablet 0  . triamterene-hydrochlorothiazide (MAXZIDE-25) 37.5-25 MG tablet take 1 tablet by mouth once daily 30 tablet 3   No facility-administered medications prior to visit.     Allergies  Allergen Reactions  . Latex Hives    Latex gloves     Review of Systems  Constitutional: Negative for fever and malaise/fatigue.  HENT: Negative for congestion.   Eyes: Negative for blurred vision.  Respiratory: Negative for cough and shortness of breath.   Cardiovascular: Negative for chest pain, palpitations and leg swelling.  Gastrointestinal: Negative for vomiting.  Musculoskeletal: Negative for back pain.  Skin: Negative for rash.  Neurological: Negative for loss of consciousness and headaches.       Objective:    Physical Exam  Constitutional: He is oriented to person, place, and time. He appears well-developed and well-nourished. No distress.  HENT:  Head: Normocephalic and atraumatic.  Eyes: Conjunctivae are normal.  Neck: Normal range of motion. No thyromegaly present.  Cardiovascular: Normal rate and regular rhythm.   Murmur heard. Pulmonary/Chest: Effort normal  and breath sounds normal. He has no wheezes.  Abdominal: Soft. Bowel sounds are normal. There is no tenderness.  Musculoskeletal: Normal range of motion. He exhibits no edema or deformity.  Neurological: He is alert and oriented to person, place, and time.  Skin: Skin is warm and dry. He is not diaphoretic.  Psychiatric: He has a normal mood and affect.    BP 140/78 (BP Location: Left Arm, Patient Position: Sitting, Cuff Size: Normal)   Pulse 79   Temp 98 F (36.7 C) (Oral)   Wt (!) 330 lb 3.2 oz (149.8 kg)   SpO2 98%   BMI 42.40 kg/m  Wt Readings from Last 3 Encounters:  10/05/16 (!) 330 lb 3.2 oz (149.8 kg)  09/14/16 (!) 336 lb 1.9 oz (152.5 kg)  08/09/16 (!) 342 lb (155.1 kg)     Lab Results  Component Value Date   WBC 9.0 10/26/2015   HGB 13.9 10/26/2015   HCT 42.7 10/26/2015   PLT 277.0 10/26/2015   GLUCOSE 107 (H) 09/27/2016   CHOL 146 10/26/2015   TRIG 68.0 10/26/2015   HDL 33.70 (L) 10/26/2015   LDLCALC 98 10/26/2015   ALT 11 10/26/2015   AST 12 10/26/2015   NA 137 09/27/2016   K 3.2 (L) 09/27/2016   CL 95 (L) 09/27/2016   CREATININE 0.98 09/27/2016   BUN 14 09/27/2016   CO2 27 09/27/2016   TSH 1.45 10/26/2015   PSA 7.32 (H) 09/07/2010   INR 1.21 01/02/2012   HGBA1C 5.5 10/26/2015    Lab Results  Component Value Date   TSH 1.45 10/26/2015   Lab Results  Component Value Date   WBC 9.0 10/26/2015   HGB 13.9 10/26/2015   HCT 42.7 10/26/2015   MCV 86.6 10/26/2015  PLT 277.0 10/26/2015   Lab Results  Component Value Date   NA 137 09/27/2016   K 3.2 (L) 09/27/2016   CO2 27 09/27/2016   GLUCOSE 107 (H) 09/27/2016   BUN 14 09/27/2016   CREATININE 0.98 09/27/2016   BILITOT 0.8 10/26/2015   ALKPHOS 98 10/26/2015   AST 12 10/26/2015   ALT 11 10/26/2015   PROT 7.0 10/26/2015   ALBUMIN 4.0 10/26/2015   CALCIUM 9.2 09/27/2016   GFR 110.41 10/26/2015   Lab Results  Component Value Date   CHOL 146 10/26/2015   Lab Results  Component Value  Date   HDL 33.70 (L) 10/26/2015   Lab Results  Component Value Date   LDLCALC 98 10/26/2015   Lab Results  Component Value Date   TRIG 68.0 10/26/2015   Lab Results  Component Value Date   CHOLHDL 4 10/26/2015   Lab Results  Component Value Date   HGBA1C 5.5 10/26/2015       Assessment & Plan:   Problem List Items Addressed This Visit    Obesity, Class III, BMI 40-49.9 (morbid obesity) ([place])    Encouraged DASH diet, decrease po intake and increase exercise as tolerated. Needs 7-8 hours of sleep nightly. Avoid trans fats, eat small, frequent meals every 4-5 hours with lean proteins, complex carbs and healthy fats. Minimize simple carbs. Referred to bariatric program for further consideration      Essential hypertension - Primary    Well controlled, no changes to meds. Encouraged heart healthy diet such as the DASH diet and exercise as tolerated.       Relevant Orders   CBC   Comprehensive metabolic panel   TSH   Hyperglycemia    hgba1c acceptable, minimize simple carbs. Increase exercise as tolerated.      Relevant Orders   Hemoglobin A1c   PSA, INCREASED    Asymptomatic does not even have to get up at night, had a complete work up with urology and no concerns identified will continue watchful waiting      Dystrophic nail    Following with DPM Dr Neomia Dear. Doing well      Dyslipidemia   Relevant Orders   Lipid panel   Hypokalemia    Took the elevated dose of potassium as directed by cardiology then returned to his 2 tabs tid will check level today to reevaluate. He is asymptomatic      Glaucoma      I am having Mr. Ikerd maintain his ferrous sulfate, multivitamin with minerals, aspirin EC, clobetasol ointment, hydrocortisone, fluticasone, benzonatate, carvedilol, amLODipine, omeprazole, potassium chloride SA, triamterene-hydrochlorothiazide, furosemide, and losartan.  No orders of the defined types were placed in this encounter.   CMA served as Education administrator  during this visit. History, Physical and Plan performed by medical provider. Documentation and orders reviewed and attested to.  [name], MD

## 2016-10-05 NOTE — Progress Notes (Signed)
Pre visit review using our clinic review tool, if applicable. No additional management support is needed unless otherwise documented below in the visit note. 

## 2016-10-17 ENCOUNTER — Other Ambulatory Visit: Payer: Self-pay | Admitting: Family Medicine

## 2016-10-19 ENCOUNTER — Telehealth: Payer: Self-pay | Admitting: Family Medicine

## 2016-10-19 ENCOUNTER — Other Ambulatory Visit (INDEPENDENT_AMBULATORY_CARE_PROVIDER_SITE_OTHER): Payer: Medicare Other

## 2016-10-19 ENCOUNTER — Other Ambulatory Visit: Payer: Self-pay

## 2016-10-19 DIAGNOSIS — E876 Hypokalemia: Secondary | ICD-10-CM | POA: Diagnosis not present

## 2016-10-19 LAB — COMPREHENSIVE METABOLIC PANEL
ALT: 19 U/L (ref 0–53)
AST: 16 U/L (ref 0–37)
Albumin: 3.9 g/dL (ref 3.5–5.2)
Alkaline Phosphatase: 76 U/L (ref 39–117)
BUN: 13 mg/dL (ref 6–23)
CALCIUM: 9.1 mg/dL (ref 8.4–10.5)
CHLORIDE: 100 meq/L (ref 96–112)
CO2: 33 meq/L — AB (ref 19–32)
Creatinine, Ser: 1.02 mg/dL (ref 0.40–1.50)
GFR: 91.65 mL/min (ref >60.00)
Glucose, Bld: 122 mg/dL — ABNORMAL HIGH (ref 70–99)
Potassium: 3.3 mEq/L — ABNORMAL LOW (ref 3.5–5.1)
Sodium: 138 mEq/L (ref 135–145)
Total Bilirubin: 0.8 mg/dL (ref 0.2–1.2)
Total Protein: 7.1 g/dL (ref 6.0–8.3)

## 2016-10-19 NOTE — Telephone Encounter (Signed)
Advise on this concern from the pharmacy

## 2016-10-19 NOTE — Telephone Encounter (Signed)
Not sure why they are telling him that? Maybe because his K is mildly low? If so can stop both meds and check bp and cmp in 1 week

## 2016-10-19 NOTE — Telephone Encounter (Signed)
See previous result note

## 2016-10-19 NOTE — Telephone Encounter (Signed)
Caller name: Relationship to patient: Self Can be reached: [phone]  Pharmacy:  Reason for call: Pharmacy informed patient that he should not be taking triamterene-hydrochlorothiazide (MAXZIDE-25) 37.5-25 MG tablet [id number] and potassium chloride SA (K-DUR,KLOR-CON) 20 MEQ tablet [id number] together. Plse adv

## 2016-10-20 ENCOUNTER — Other Ambulatory Visit: Payer: Self-pay

## 2016-10-20 ENCOUNTER — Other Ambulatory Visit: Payer: Medicare Other

## 2016-10-20 ENCOUNTER — Telehealth: Payer: Self-pay | Admitting: Cardiovascular Disease

## 2016-10-20 DIAGNOSIS — E876 Hypokalemia: Secondary | ICD-10-CM

## 2016-10-20 NOTE — Telephone Encounter (Signed)
New Message    Pt c/o medication issue:  1. Name of Medication: triamterene-hydrochlorothiazide (MAXZIDE-25) 37.5-25 MG tablet   potassium chloride SA (K-DUR,KLOR-CON) 20 MEQ tablet   2. How are you currently taking this medication (dosage and times per day)? As prescribed  3. Are you having a reaction (difficulty breathing--STAT)? No  4. What is your medication issue? Per wife pharmacist told them that these two medications are not taken together unless they are watching potassium levels. Requesting call back

## 2016-10-20 NOTE — Telephone Encounter (Signed)
Called the patient and he already had been informed by someone else already

## 2016-10-20 NOTE — Telephone Encounter (Signed)
I spoke with pt's wife and told her Dr. Angelena Form was aware pt was on these medications. He was on them at last office visit here.  Pt is having CMET checked again soon at primary care.

## 2016-10-26 ENCOUNTER — Other Ambulatory Visit (INDEPENDENT_AMBULATORY_CARE_PROVIDER_SITE_OTHER): Payer: Medicare Other

## 2016-10-26 DIAGNOSIS — E876 Hypokalemia: Secondary | ICD-10-CM | POA: Diagnosis not present

## 2016-10-26 LAB — COMPREHENSIVE METABOLIC PANEL
ALBUMIN: 3.8 g/dL (ref 3.5–5.2)
ALT: 13 U/L (ref 0–53)
AST: 15 U/L (ref 0–37)
Alkaline Phosphatase: 76 U/L (ref 39–117)
BUN: 9 mg/dL (ref 6–23)
CALCIUM: 9.4 mg/dL (ref 8.4–10.5)
CHLORIDE: 103 meq/L (ref 96–112)
CO2: 31 meq/L (ref 19–32)
CREATININE: 1 mg/dL (ref 0.40–1.50)
GFR: 93.76 mL/min (ref >60.00)
Glucose, Bld: 120 mg/dL — ABNORMAL HIGH (ref 70–99)
POTASSIUM: 4.4 meq/L (ref 3.5–5.1)
Sodium: 139 mEq/L (ref 135–145)
Total Bilirubin: 0.9 mg/dL (ref 0.2–1.2)
Total Protein: 7 g/dL (ref 6.0–8.3)

## 2016-11-01 ENCOUNTER — Ambulatory Visit: Payer: Medicare Other | Admitting: Podiatry

## 2016-11-09 ENCOUNTER — Encounter: Payer: Self-pay | Admitting: Podiatry

## 2016-11-09 ENCOUNTER — Ambulatory Visit (INDEPENDENT_AMBULATORY_CARE_PROVIDER_SITE_OTHER): Payer: Medicare Other | Admitting: Podiatry

## 2016-11-09 VITALS — Ht 72.0 in | Wt 330.0 lb

## 2016-11-09 DIAGNOSIS — M79609 Pain in unspecified limb: Secondary | ICD-10-CM

## 2016-11-09 DIAGNOSIS — B351 Tinea unguium: Secondary | ICD-10-CM

## 2016-11-09 NOTE — Progress Notes (Signed)
Patient ID: David Parsons, male   DOB: 26-Jan-1941, 76 y.o.   MRN: [id number] Complaint:  Visit Type: Patient returns to my office for continued preventative foot care services. Complaint: Patient states" my nails have grown long and thick and become painful to walk and wear shoes" . He presents for preventative foot care services. No changes to ROS  Podiatric Exam: Vascular: dorsalis pedis and posterior tibial pulses are palpable bilateral. Capillary return is immediate. Temperature gradient is WNL. Skin turgor WNL  Sensorium: Normal Semmes Weinstein monofilament test. Normal tactile sensation bilaterally. Nail Exam: Pt has thick disfigured discolored nails with subungual debris noted bilateral entire nail hallux through fifth toenails Ulcer Exam: There is no evidence of ulcer or pre-ulcerative changes or infection. Orthopedic Exam: Muscle tone and strength are WNL. No limitations in general ROM. No crepitus or effusions noted. Foot type and digits show no abnormalities. Bony prominences are unremarkable. Skin: No Porokeratosis. No infection or ulcers  Diagnosis:  Tinea unguium, Pain in right toe, pain in left toes  Treatment & Plan Procedures and Treatment: Consent by patient was obtained for treatment procedures. The patient understood the discussion of treatment and procedures well. All questions were answered thoroughly reviewed. Debridement of mycotic and hypertrophic toenails, 1 through 5 bilateral and clearing of subungual debris. No ulceration, no infection noted.  Return Visit-Office Procedure: Patient instructed to return to the office for a follow up visit 3 months for continued evaluation and treatment.  Gardiner Barefoot DPM

## 2016-11-12 ENCOUNTER — Other Ambulatory Visit: Payer: Self-pay | Admitting: Family Medicine

## 2016-11-16 DIAGNOSIS — H35371 Puckering of macula, right eye: Secondary | ICD-10-CM | POA: Diagnosis not present

## 2016-11-16 DIAGNOSIS — H04123 Dry eye syndrome of bilateral lacrimal glands: Secondary | ICD-10-CM | POA: Diagnosis not present

## 2016-11-16 DIAGNOSIS — H25813 Combined forms of age-related cataract, bilateral: Secondary | ICD-10-CM | POA: Diagnosis not present

## 2016-11-16 DIAGNOSIS — H401132 Primary open-angle glaucoma, bilateral, moderate stage: Secondary | ICD-10-CM | POA: Diagnosis not present

## 2016-11-16 DIAGNOSIS — H524 Presbyopia: Secondary | ICD-10-CM | POA: Diagnosis not present

## 2016-11-27 ENCOUNTER — Other Ambulatory Visit: Payer: Self-pay | Admitting: Family Medicine

## 2016-12-09 ENCOUNTER — Other Ambulatory Visit: Payer: Self-pay | Admitting: Family Medicine

## 2016-12-20 ENCOUNTER — Ambulatory Visit (INDEPENDENT_AMBULATORY_CARE_PROVIDER_SITE_OTHER): Payer: Medicare Other | Admitting: Medical

## 2016-12-20 VITALS — BP 127/65 | HR 76 | Temp 98.7°F | Resp 16 | Ht 72.0 in | Wt 333.8 lb

## 2016-12-20 DIAGNOSIS — R05 Cough: Secondary | ICD-10-CM

## 2016-12-20 DIAGNOSIS — I2510 Atherosclerotic heart disease of native coronary artery without angina pectoris: Secondary | ICD-10-CM

## 2016-12-20 DIAGNOSIS — J40 Bronchitis, not specified as acute or chronic: Secondary | ICD-10-CM

## 2016-12-20 DIAGNOSIS — J3489 Other specified disorders of nose and nasal sinuses: Secondary | ICD-10-CM

## 2016-12-20 DIAGNOSIS — J301 Allergic rhinitis due to pollen: Secondary | ICD-10-CM

## 2016-12-20 DIAGNOSIS — R059 Cough, unspecified: Secondary | ICD-10-CM

## 2016-12-20 MED ORDER — AZITHROMYCIN 250 MG PO TABS: ORAL_TABLET | ORAL | 0 refills | Status: DC

## 2016-12-20 MED ORDER — BENZONATATE 100 MG PO CAPS: 100.0000 mg | ORAL_CAPSULE | Freq: Three times a day (TID) | ORAL | 0 refills | Status: DC | PRN

## 2016-12-20 MED ORDER — AZELASTINE HCL 0.1 % NA SOLN: 2.0000 | Freq: Two times a day (BID) | NASAL | 2 refills | Status: DC

## 2016-12-20 MED ORDER — LEVOCETIRIZINE DIHYDROCHLORIDE 5 MG PO TABS: 5.0000 mg | ORAL_TABLET | Freq: Every evening | ORAL | 0 refills | Status: DC

## 2016-12-20 NOTE — Patient Instructions (Addendum)
For allergic rhinitis continue flonase. Will add astelin nasal spray also. Rx xyzal as well.  For possible sinus and bronchitis. Rx azithromycin. For cough benzonatate.  If symptoms worsen or  Change please let us know.  Follow up in 7 days or as needed

## 2016-12-20 NOTE — Progress Notes (Signed)
Pre visit review using our clinic review tool, if applicable. No additional management support is needed unless otherwise documented below in the visit note. 

## 2016-12-20 NOTE — Progress Notes (Signed)
Subjective:    Patient ID: David Parsons, male    DOB: October 09, 1940, 76 y.o.   MRN: [id number]  HPI   Pt states he thinks may have some allergies initially but also his wife has been sick.  Pt has nasal congestion with a lot of pnd. A lot of sneezing. Pt has some sinus pressure, chest congestion and productive cough.  No fever, no chills or sweats.   No wheezing.     Review of Systems  Constitutional: Negative for chills, fatigue and fever.  HENT: Positive for congestion, sinus pain and sinus pressure. Negative for sore throat and trouble swallowing.   Respiratory: Positive for cough. Negative for shortness of breath, wheezing and stridor.   Cardiovascular: Negative for chest pain and palpitations.  Gastrointestinal: Negative for abdominal pain and anal bleeding.  Musculoskeletal: Negative for back pain and myalgias.  Neurological: Negative for dizziness, seizures and headaches.  Hematological: Negative for adenopathy. Does not bruise/bleed easily.  Psychiatric/Behavioral: Negative for behavioral problems and confusion.    Past Medical History:  Diagnosis Date  . Aortic insufficiency     02/16/09 Chest CT:  The aortic root has a diameter measuring 4.6 cm, image 64 of the coronal series.  The ascending    thoracic aorta has a diameter of 3.8 cm, image 65 of the coronal series.  At the level of the aortic arch    the thoracic aorta has a maximum diameter of 3.2 cm.  The descending thoracic aorta has a maximum           diameter of 3.2 cm.  There is no evidence for aortic disse        . Aortic root enlargement (HCC)    The aortic root has a diameter measuring 4.6 cm, image 64 of the coronal series.  The ascending  thoracic aorta has a diameter of 3.8 cm, image 65 of the coronal series.  At the level of the aortic arch   the thoracic aorta has a maximum diameter of 3.2 cm.  The descending thoracic aorta has a maximum   diameter of 3.2 cm.  There is no evidence for aortic dissection.        Marland Kitchen CAD (coronary artery disease)   . Cardiomyopathy     01/2009 - Cath - nonobs. dzs.  EF 35%.  . CHF (congestive heart failure) ([place])   . Diabetes mellitus   . Dyslipidemia 01/14/2013  . Gallstones    s/p cholecystectomy    . GERD (gastroesophageal reflux disease)   . Glaucoma 10/05/2016  . Heart murmur   . Helicobacter pylori gastritis    ([place] 4/10)  . Hypertension   . Hypokalemia   . Iron deficiency anemia   . Obesity, Class III, BMI 40-49.9 (morbid obesity) ([place])   . Onychomycosis 03/09/2015  . PONV (postoperative nausea and vomiting)   . 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 Sleep apnea    occasionally wears CPAP at night     Social History   Social History  . Marital status: Married    Spouse name: [name]  . Number of children: 0  . Years of education: N/A   Occupational History  .  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 Smoking status: Never Smoker  . Smokeless tobacco: Never Used  . Alcohol use No  . Drug use: No  . Sexual activity: Not Currently     Comment: lives with wife, pastros 2 churches   Other Topics Concern  . Not on file   Social History Narrative   The patient is married, lives with his wife in  [place].  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  He  lives a very sedentary lifestyle.  He is a nonsmoker.  He denies alcohol consumption.     Past Surgical History:  Procedure Laterality Date  . ADRENALECTOMY    . BENTALL PROCEDURE  12/28/2011   Procedure: BENTALL PROCEDURE;  Surgeon: Rexene Alberts, MD;  Location: [facility];  Service: Open Heart Surgery;  Laterality: N/A;  . CARDIAC CATHETERIZATION     2013  . CARDIAC VALVE  REPLACEMENT  12/28/2011   Bentall with tissue valve  . CHOLECYSTECTOMY    . COLONOSCOPY  09/25/2011  . COLONOSCOPY W/ BIOPSIES AND POLYPECTOMY  11/04/2008   colon polyps, diverticulosis   . CORONARY ARTERY BYPASS GRAFT  12/28/2011   Procedure: CORONARY ARTERY BYPASS GRAFTING (CABG);  Surgeon: Rexene Alberts, MD;  Location: [facility];  Service: Open Heart Surgery;  Laterality: N/A;  cabg x 1  . LEFT AND RIGHT HEART CATHETERIZATION WITH CORONARY ANGIOGRAM N/A 11/22/2011   Procedure: LEFT AND RIGHT HEART CATHETERIZATION WITH CORONARY ANGIOGRAM;  Surgeon: Burnell Blanks, MD;  Location: [facility];  Service: Cardiovascular;  Laterality: N/A;  . MULTIPLE TOOTH EXTRACTIONS    . TEE WITHOUT CARDIOVERSION  11/22/2011   Procedure: TRANSESOPHAGEAL ECHOCARDIOGRAM (TEE);  Surgeon: Larey Dresser, MD;  Location: [facility];  Service: Cardiovascular;  Laterality: N/A;  . UPPER GASTROINTESTINAL ENDOSCOPY  11/04/2008   w/bx, H pylori gastritis  . UPPER GASTROINTESTINAL ENDOSCOPY  09/25/2011    Family History  Problem Relation Age of Onset  . Breast cancer Sister   . Diabetes Sister   . Colon cancer Brother 70    at least 63  . Diabetes Brother   . Cancer Brother     colon, prostate  . Prostate cancer Brother   . Diabetes Brother   . Diabetes Sister   . Cancer Sister     breast, colon, melanoma  . Heart disease Sister   . Hypertension Mother   . Arthritis Mother   . Kidney failure Sister   . Colon cancer Sister   . Breast cancer Sister   . Kidney disease Sister     s/p transplant  . Hypertension Sister   . Diabetes Sister   . Stroke Father   . Hypertension Father     Allergies  Allergen Reactions  . Latex Hives    Latex gloves     Current Outpatient Prescriptions on File Prior to Visit  Medication Sig Dispense Refill  . amLODipine (NORVASC) 10 MG tablet take 1 tablet by mouth once daily 90 tablet 1  . aspirin EC 81 MG tablet Take 81 mg by mouth daily.    . carvedilol (COREG) 25 MG  tablet take 1 tablet by mouth twice a day with meals 60 tablet 6  . clobetasol ointment (TEMOVATE) 0.05 % apply to affected area twice a day UPON ITCHING AND IRRITATION(NOT TO FACE OR GROIN)  0  . ferrous sulfate 325 (65 FE) MG tablet Take 325 mg by mouth 2 (two)  times daily before a meal.    . fluticasone (FLONASE) 50 MCG/ACT nasal spray Place 2 sprays into both nostrils daily. 16 g 1  . furosemide (LASIX) 40 MG tablet TAKE 1 TABLET BY MOUTH TWICE DAILY 180 tablet 0  . hydrocortisone 2.5 % cream Apply 1 application topically 2 (two) times daily.   0  . KLOR-CON M20 20 MEQ tablet TAKE 3 TABLETS IN THE MORNING, TAKE 2 TABLETS AT LUNCH AND TAKE 2 TABLETS IN THE EVENING. 196 tablet 0  . Multiple Vitamin (MULITIVITAMIN WITH MINERALS) TABS Take 1 tablet by mouth daily.    Marland Kitchen omeprazole (PRILOSEC) 40 MG capsule take 1 capsule by mouth once daily 30 capsule 5  . triamterene-hydrochlorothiazide (MAXZIDE-25) 37.5-25 MG tablet TAKE 1 TABLET EVERY DAY 30 tablet 5  . losartan (COZAAR) 100 MG tablet Take 1 tablet (100 mg total) by mouth daily. 30 tablet 11   No current facility-administered medications on file prior to visit.     BP 127/65 (BP Location: Left Arm, Patient Position: Sitting, Cuff Size: Large)   Pulse 76   Temp 98.7 F (37.1 C) (Other (Comment))   Resp 16   Ht 6' (1.829 m)   Wt (!) 333 lb 12.8 oz (151.4 kg)   SpO2 96%   BMI 45.27 kg/m       Objective:   Physical Exam  General  Mental Status - Alert. General Appearance - Well groomed. Not in acute distress.  Skin Rashes- No Rashes.  HEENT Head- Normal. Ear Auditory Canal - Left- Normal. Right - Normal.Tympanic Membrane- Left- Normal. Right- Normal. Eye Sclera/Conjunctiva- Left- Normal. Right- Normal. Nose & Sinuses Nasal Mucosa- Left-  Boggy and Congested. Right-  Boggy and  Congested.Bilateral  Faint maxillary and faint  frontal sinus pressure. Mouth & Throat Lips: Upper Lip- Normal: no dryness, cracking, pallor,  cyanosis, or vesicular eruption. Lower Lip-Normal: no dryness, cracking, pallor, cyanosis or vesicular eruption. Buccal Mucosa- Bilateral- No Aphthous ulcers. Oropharynx- No Discharge or Erythema. Tonsils: Characteristics- Bilateral- No Erythema or Congestion. Size/Enlargement- Bilateral- No enlargement. Discharge- bilateral-None.  Neck Neck- Supple. No Masses.   Chest and Lung Exam Auscultation: Breath Sounds:-Clear even and unlabored.  Cardiovascular Auscultation:Rythm- Regular, rate and rhythm. Murmurs & Other Heart Sounds:Ausculatation of the heart reveal- No Murmurs.  Lymphatic Head & Neck General Head & Neck Lymphatics: Bilateral: Description- No Localized lymphadenopathy.      Assessment & Plan:  For allergic rhinitis continue flonase.  Will add astelin nasal spray as well. Rx xyzal as well.  For possible sinus and bronchitis. Rx azithromycin. For cough benzonatate.  If symptoms worsen or  Change please let us know.  Follow up in 7 days or as needed  Tuyet Bader, Percell Miller, [employer]

## 2016-12-25 ENCOUNTER — Telehealth: Payer: Self-pay | Admitting: Family Medicine

## 2016-12-25 MED ORDER — HYDROCODONE-HOMATROPINE 5-1.5 MG/5ML PO SYRP: 5.0000 mL | ORAL_SOLUTION | Freq: Three times a day (TID) | ORAL | 0 refills | Status: DC | PRN

## 2016-12-25 NOTE — Telephone Encounter (Signed)
Let pt know he can stop benzonatate. He can pick up hycodan in the  office. Explain to pt controlled medication that we can't call in.

## 2016-12-25 NOTE — Telephone Encounter (Signed)
Caller name: Westerhold,Brenda Relation to pt: spouse Call back number: [phone] Pharmacy:  [id number] - [place], [place]   Reason for call:   Patient was last seen 12/20/16 by Wynelle Cleveland prescribe for cough is not working requesting another Rx, please advise

## 2016-12-26 NOTE — Telephone Encounter (Signed)
Notified pt to stop benzonatate and can come pick up hycodan today at front desk.

## 2017-01-10 ENCOUNTER — Other Ambulatory Visit: Payer: Self-pay | Admitting: Family Medicine

## 2017-01-27 ENCOUNTER — Other Ambulatory Visit: Payer: Self-pay | Admitting: Family Medicine

## 2017-01-31 ENCOUNTER — Ambulatory Visit (INDEPENDENT_AMBULATORY_CARE_PROVIDER_SITE_OTHER): Payer: Medicare Other | Admitting: Podiatry

## 2017-01-31 ENCOUNTER — Encounter: Payer: Self-pay | Admitting: Podiatry

## 2017-01-31 DIAGNOSIS — M79609 Pain in unspecified limb: Secondary | ICD-10-CM

## 2017-01-31 DIAGNOSIS — B351 Tinea unguium: Secondary | ICD-10-CM | POA: Diagnosis not present

## 2017-01-31 NOTE — Progress Notes (Signed)
Patient ID: David Parsons, male   DOB: 1940/09/02, 76 y.o.   MRN: [id number] Complaint:  Visit Type: Patient returns to my office for continued preventative foot care services. Complaint: Patient states" my nails have grown long and thick and become painful to walk and wear shoes" . He presents for preventative foot care services. No changes to ROS  Podiatric Exam: Vascular: dorsalis pedis and posterior tibial pulses are palpable bilateral. Capillary return is immediate. Temperature gradient is WNL. Skin turgor WNL  Sensorium: Normal Semmes Weinstein monofilament test. Normal tactile sensation bilaterally. Nail Exam: Pt has thick disfigured discolored nails with subungual debris noted bilateral entire nail hallux through fifth toenails Ulcer Exam: There is no evidence of ulcer or pre-ulcerative changes or infection. Orthopedic Exam: Muscle tone and strength are WNL. No limitations in general ROM. No crepitus or effusions noted. Foot type and digits show no abnormalities. Bony prominences are unremarkable. Skin: No Porokeratosis. No infection or ulcers  Diagnosis:  Tinea unguium, Pain in right toe, pain in left toes  Treatment & Plan Procedures and Treatment: Consent by patient was obtained for treatment procedures. The patient understood the discussion of treatment and procedures well. All questions were answered thoroughly reviewed. Debridement of mycotic and hypertrophic toenails, 1 through 5 bilateral and clearing of subungual debris. No ulceration, no infection noted.  Return Visit-Office Procedure: Patient instructed to return to the office for a follow up visit 3 months for continued evaluation and treatment.  Gardiner Barefoot DPM

## 2017-02-05 DIAGNOSIS — L820 Inflamed seborrheic keratosis: Secondary | ICD-10-CM | POA: Diagnosis not present

## 2017-02-05 DIAGNOSIS — L910 Hypertrophic scar: Secondary | ICD-10-CM | POA: Diagnosis not present

## 2017-02-07 ENCOUNTER — Other Ambulatory Visit: Payer: Self-pay | Admitting: Family Medicine

## 2017-02-10 ENCOUNTER — Other Ambulatory Visit: Payer: Self-pay | Admitting: Medical

## 2017-02-20 ENCOUNTER — Other Ambulatory Visit: Payer: Self-pay | Admitting: Family Medicine

## 2017-02-27 ENCOUNTER — Other Ambulatory Visit: Payer: Self-pay | Admitting: Family Medicine

## 2017-02-27 MED ORDER — TRIAMTERENE-HCTZ 37.5-25 MG PO TABS: 1.0000 | ORAL_TABLET | Freq: Every day | ORAL | 1 refills | Status: DC

## 2017-03-09 ENCOUNTER — Other Ambulatory Visit: Payer: Self-pay | Admitting: Family Medicine

## 2017-03-12 ENCOUNTER — Other Ambulatory Visit: Payer: Self-pay

## 2017-03-12 DIAGNOSIS — I5033 Acute on chronic diastolic (congestive) heart failure: Secondary | ICD-10-CM

## 2017-03-12 MED ORDER — FUROSEMIDE 40 MG PO TABS: 40.0000 mg | ORAL_TABLET | Freq: Two times a day (BID) | ORAL | 0 refills | Status: DC

## 2017-03-22 DIAGNOSIS — H04123 Dry eye syndrome of bilateral lacrimal glands: Secondary | ICD-10-CM | POA: Diagnosis not present

## 2017-03-22 DIAGNOSIS — H401132 Primary open-angle glaucoma, bilateral, moderate stage: Secondary | ICD-10-CM | POA: Diagnosis not present

## 2017-03-22 DIAGNOSIS — H25813 Combined forms of age-related cataract, bilateral: Secondary | ICD-10-CM | POA: Diagnosis not present

## 2017-03-22 DIAGNOSIS — H35371 Puckering of macula, right eye: Secondary | ICD-10-CM | POA: Diagnosis not present

## 2017-03-25 ENCOUNTER — Other Ambulatory Visit: Payer: Self-pay | Admitting: Family Medicine

## 2017-03-26 ENCOUNTER — Telehealth: Payer: Self-pay | Admitting: Cardiovascular Disease

## 2017-03-26 NOTE — Telephone Encounter (Signed)
I do not write for weight loss medications. Megan, do you know much about this drug for weight loss?   Gerald Stabs

## 2017-03-26 NOTE — Telephone Encounter (Signed)
thanks

## 2017-03-26 NOTE — Telephone Encounter (Signed)
Left message for patient to call back  

## 2017-03-26 NOTE — Telephone Encounter (Signed)
I called patient's wife, [name], and reviewed the information from Dr. Angelena Form and [place], [place]. She verbalized understanding and thanked me for the call.

## 2017-03-26 NOTE — Telephone Encounter (Signed)
Spoke with patient's wife who called to ask if Topiramate would be safe for patient to take with his cardiac history and cardiac medications.  She states that a friend is taking that medication and reported that he has lost a lot of weight. She asks if Dr. Angelena Form will prescribe Topiramate for weight loss. I advised that we generally do not prescribe weight loss medications but I will route message to Dr. Angelena Form for review. She thanked me for the call.

## 2017-03-26 NOTE — Telephone Encounter (Signed)
New message   Pt c/o medication issue:  1. Name of Medication: Tapiramage  2. How are you currently taking this medication (dosage and times per day)? unknown  3. Are you having a reaction (difficulty breathing--STAT)? no  4. What is your medication issue? Pt wants to know can he take this medicine given his current heart condition

## 2017-03-26 NOTE — Telephone Encounter (Signed)
Topiramate is approved as a weight loss product in combination with phentermine (brand name Qsymia). In general, weight loss products have limited efficacy (usually limited to maximum of ~7% decrease in total body weight). Qsymia also causes significant tachycardia which would not be ideal for pt due to his HF. Use of topiramate alone for weight loss is an off-label use, and would tend to avoid its use in elderly patients due to common side effects with > 10% incidence of memory impairment, dizziness, and drowsiness.

## 2017-04-05 ENCOUNTER — Other Ambulatory Visit: Payer: Self-pay | Admitting: Family Medicine

## 2017-04-06 ENCOUNTER — Ambulatory Visit: Payer: Medicare Other | Admitting: Family Medicine

## 2017-04-26 ENCOUNTER — Telehealth: Payer: Self-pay | Admitting: Family Medicine

## 2017-04-26 NOTE — Telephone Encounter (Signed)
I called pharm/they said that they were just trying to get an Rx for the Maxzide/I tried to call the patient to find out what info he got because they did not say anything about needing to change it only needing a new Rx/will await RTC/thx dmf

## 2017-04-26 NOTE — Telephone Encounter (Signed)
°  Relation to pt: self Call back number:[phone] [id number] [place], [place]  Reason for call:  Pharmacy informed patient triamterene-hydrochlorothiazide (MAXZIDE-25) 03.7-09 MG tablet conflicts with he's lasix pharmacy requesting an alternate as per patient, please advise

## 2017-04-27 MED ORDER — TRIAMTERENE-HCTZ 37.5-25 MG PO TABS: 1.0000 | ORAL_TABLET | Freq: Every day | ORAL | 0 refills | Status: DC

## 2017-04-27 NOTE — Telephone Encounter (Signed)
Spoke with pt who advised that this is not the 1st time that this pharm has told him that the Maxzide and Furosemide are not compatible but he needs the medication and expresses that his feet are starting to swell without it/I advised him that they are aware that "you ARE to take both meds" and I also [facility]/thx dmf

## 2017-05-02 ENCOUNTER — Other Ambulatory Visit: Payer: Self-pay | Admitting: Family Medicine

## 2017-05-07 ENCOUNTER — Encounter: Payer: Self-pay | Admitting: Family Medicine

## 2017-05-07 ENCOUNTER — Ambulatory Visit (INDEPENDENT_AMBULATORY_CARE_PROVIDER_SITE_OTHER): Payer: Medicare Other | Admitting: Family Medicine

## 2017-05-07 VITALS — BP 142/74 | HR 75 | Temp 97.7°F | Ht 73.0 in | Wt 330.0 lb

## 2017-05-07 DIAGNOSIS — Z23 Encounter for immunization: Secondary | ICD-10-CM

## 2017-05-07 DIAGNOSIS — I2510 Atherosclerotic heart disease of native coronary artery without angina pectoris: Secondary | ICD-10-CM | POA: Diagnosis not present

## 2017-05-07 DIAGNOSIS — I10 Essential (primary) hypertension: Secondary | ICD-10-CM | POA: Diagnosis not present

## 2017-05-07 DIAGNOSIS — Z951 Presence of aortocoronary bypass graft: Secondary | ICD-10-CM | POA: Diagnosis not present

## 2017-05-07 DIAGNOSIS — E785 Hyperlipidemia, unspecified: Secondary | ICD-10-CM | POA: Diagnosis not present

## 2017-05-07 DIAGNOSIS — R739 Hyperglycemia, unspecified: Secondary | ICD-10-CM

## 2017-05-07 LAB — COMPREHENSIVE METABOLIC PANEL
ALT: 13 U/L (ref 0–53)
AST: 15 U/L (ref 0–37)
Albumin: 4 g/dL (ref 3.5–5.2)
Alkaline Phosphatase: 74 U/L (ref 39–117)
BILIRUBIN TOTAL: 1.4 mg/dL — AB (ref 0.2–1.2)
BUN: 16 mg/dL (ref 6–23)
CHLORIDE: 99 meq/L (ref 96–112)
CO2: 29 meq/L (ref 19–32)
CREATININE: 1.15 mg/dL (ref 0.40–1.50)
Calcium: 9.6 mg/dL (ref 8.4–10.5)
GFR: 79.68 mL/min (ref >60.00)
GLUCOSE: 121 mg/dL — AB (ref 70–99)
Potassium: 3.1 mEq/L — ABNORMAL LOW (ref 3.5–5.1)
SODIUM: 139 meq/L (ref 135–145)
Total Protein: 7.6 g/dL (ref 6.0–8.3)

## 2017-05-07 LAB — CBC
HEMATOCRIT: 45 % (ref 39.0–52.0)
HEMOGLOBIN: 14.8 g/dL (ref 13.0–17.0)
MCHC: 33 g/dL (ref 30.0–36.0)
MCV: 89.5 fl (ref 78.0–100.0)
PLATELETS: 265 10*3/uL (ref 150.0–400.0)
RBC: 5.03 Mil/uL (ref 4.22–5.81)
RDW: 13.9 % (ref 11.5–15.5)
WBC: 7.9 10*3/uL (ref 4.0–10.5)

## 2017-05-07 LAB — LIPID PANEL
CHOL/HDL RATIO: 4
Cholesterol: 172 mg/dL (ref 0–200)
HDL: 38.5 mg/dL — ABNORMAL LOW (ref >39.00)
LDL Cholesterol: 110 mg/dL — ABNORMAL HIGH (ref 0–99)
NONHDL: 133.71
Triglycerides: 121 mg/dL (ref 0.0–149.0)
VLDL: 24.2 mg/dL (ref 0.0–40.0)

## 2017-05-07 LAB — TSH: TSH: 1.23 u[IU]/mL (ref 0.35–4.50)

## 2017-05-07 LAB — HEMOGLOBIN A1C: HEMOGLOBIN A1C: 5.5 % (ref 4.6–6.5)

## 2017-05-07 NOTE — Assessment & Plan Note (Signed)
Feeling very well and following with Dr Angelena Form

## 2017-05-07 NOTE — Assessment & Plan Note (Signed)
Well controlled, no changes to meds. Encouraged heart healthy diet such as the DASH diet and exercise as tolerated.  °

## 2017-05-07 NOTE — Assessment & Plan Note (Signed)
Encouraged heart healthy diet, increase exercise, avoid trans fats, consider a krill oil cap daily 

## 2017-05-07 NOTE — Patient Instructions (Signed)
DASH Eating Plan DASH stands for "Dietary Approaches to Stop Hypertension." The DASH eating plan is a healthy eating plan that has been shown to reduce high blood pressure (hypertension). It may also reduce your risk for type 2 diabetes, heart disease, and stroke. The DASH eating plan may also help with weight loss. What are tips for following this plan? General guidelines  Avoid eating more than 2,300 mg (milligrams) of salt (sodium) a day. If you have hypertension, you may need to reduce your sodium intake to 1,500 mg a day.  Limit alcohol intake to no more than 1 drink a day for nonpregnant women and 2 drinks a day for men. One drink equals 12 oz of beer, 5 oz of wine, or 1 oz of hard liquor.  Work with your health care provider to maintain a healthy body weight or to lose weight. Ask what an ideal weight is for you.  Get at least 30 minutes of exercise that causes your heart to beat faster (aerobic exercise) most days of the week. Activities may include walking, swimming, or biking.  Work with your health care provider or diet and nutrition specialist (dietitian) to adjust your eating plan to your individual calorie needs. Reading food labels  Check food labels for the amount of sodium per serving. Choose foods with less than 5 percent of the Daily Value of sodium. Generally, foods with less than 300 mg of sodium per serving fit into this eating plan.  To find whole grains, look for the word "whole" as the first word in the ingredient list. Shopping  Buy products labeled as "low-sodium" or "no salt added."  Buy fresh foods. Avoid canned foods and premade or frozen meals. Cooking  Avoid adding salt when cooking. Use salt-free seasonings or herbs instead of table salt or sea salt. Check with your health care provider or pharmacist before using salt substitutes.  Do not fry foods. Cook foods using healthy methods such as baking, boiling, grilling, and broiling instead.  Cook with  heart-healthy oils, such as olive, canola, soybean, or sunflower oil. Meal planning   Eat a balanced diet that includes: ? 5 or more servings of fruits and vegetables each day. At each meal, try to fill half of your plate with fruits and vegetables. ? Up to 6-8 servings of whole grains each day. ? Less than 6 oz of lean meat, poultry, or fish each day. A 3-oz serving of meat is about the same size as a deck of cards. One egg equals 1 oz. ? 2 servings of low-fat dairy each day. ? A serving of nuts, seeds, or beans 5 times each week. ? Heart-healthy fats. Healthy fats called Omega-3 fatty acids are found in foods such as flaxseeds and coldwater fish, like sardines, salmon, and mackerel.  Limit how much you eat of the following: ? Canned or prepackaged foods. ? Food that is high in trans fat, such as fried foods. ? Food that is high in saturated fat, such as fatty meat. ? Sweets, desserts, sugary drinks, and other foods with added sugar. ? Full-fat dairy products.  Do not salt foods before eating.  Try to eat at least 2 vegetarian meals each week.  Eat more home-cooked food and less restaurant, buffet, and fast food.  When eating at a restaurant, ask that your food be prepared with less salt or no salt, if possible. What foods are recommended? The items listed may not be a complete list. Talk with your dietitian about what   dietary choices are best for you. Grains Whole-grain or whole-wheat bread. Whole-grain or whole-wheat pasta. Brown rice. Oatmeal. Quinoa. Bulgur. Whole-grain and low-sodium cereals. Pita bread. Low-fat, low-sodium crackers. Whole-wheat flour tortillas. Vegetables Fresh or frozen vegetables (raw, steamed, roasted, or grilled). Low-sodium or reduced-sodium tomato and vegetable juice. Low-sodium or reduced-sodium tomato sauce and tomato paste. Low-sodium or reduced-sodium canned vegetables. Fruits All fresh, dried, or frozen fruit. Canned fruit in natural juice (without  added sugar). Meat and other protein foods Skinless chicken or turkey. Ground chicken or turkey. Pork with fat trimmed off. Fish and seafood. Egg whites. Dried beans, peas, or lentils. Unsalted nuts, nut butters, and seeds. Unsalted canned beans. Lean cuts of beef with fat trimmed off. Low-sodium, lean deli meat. Dairy Low-fat (1%) or fat-free (skim) milk. Fat-free, low-fat, or reduced-fat cheeses. Nonfat, low-sodium ricotta or cottage cheese. Low-fat or nonfat yogurt. Low-fat, low-sodium cheese. Fats and oils Soft margarine without trans fats. Vegetable oil. Low-fat, reduced-fat, or light mayonnaise and salad dressings (reduced-sodium). Canola, safflower, olive, soybean, and sunflower oils. Avocado. Seasoning and other foods Herbs. Spices. Seasoning mixes without salt. Unsalted popcorn and pretzels. Fat-free sweets. What foods are not recommended? The items listed may not be a complete list. Talk with your dietitian about what dietary choices are best for you. Grains Baked goods made with fat, such as croissants, muffins, or some breads. Dry pasta or rice meal packs. Vegetables Creamed or fried vegetables. Vegetables in a cheese sauce. Regular canned vegetables (not low-sodium or reduced-sodium). Regular canned tomato sauce and paste (not low-sodium or reduced-sodium). Regular tomato and vegetable juice (not low-sodium or reduced-sodium). Pickles. Olives. Fruits Canned fruit in a light or heavy syrup. Fried fruit. Fruit in cream or butter sauce. Meat and other protein foods Fatty cuts of meat. Ribs. Fried meat. Bacon. Sausage. Bologna and other processed lunch meats. Salami. Fatback. Hotdogs. Bratwurst. Salted nuts and seeds. Canned beans with added salt. Canned or smoked fish. Whole eggs or egg yolks. Chicken or turkey with skin. Dairy Whole or 2% milk, cream, and half-and-half. Whole or full-fat cream cheese. Whole-fat or sweetened yogurt. Full-fat cheese. Nondairy creamers. Whipped toppings.  Processed cheese and cheese spreads. Fats and oils Butter. Stick margarine. Lard. Shortening. Ghee. Bacon fat. Tropical oils, such as coconut, palm kernel, or palm oil. Seasoning and other foods Salted popcorn and pretzels. Onion salt, garlic salt, seasoned salt, table salt, and sea salt. Worcestershire sauce. Tartar sauce. Barbecue sauce. Teriyaki sauce. Soy sauce, including reduced-sodium. Steak sauce. Canned and packaged gravies. Fish sauce. Oyster sauce. Cocktail sauce. Horseradish that you find on the shelf. Ketchup. Mustard. Meat flavorings and tenderizers. Bouillon cubes. Hot sauce and Tabasco sauce. Premade or packaged marinades. Premade or packaged taco seasonings. Relishes. Regular salad dressings. Where to find more information:  National Heart, Lung, and Blood Institute: www.nhlbi.nih.gov  American Heart Association: www.heart.org Summary  The DASH eating plan is a healthy eating plan that has been shown to reduce high blood pressure (hypertension). It may also reduce your risk for type 2 diabetes, heart disease, and stroke.  With the DASH eating plan, you should limit salt (sodium) intake to 2,300 mg a day. If you have hypertension, you may need to reduce your sodium intake to 1,500 mg a day.  When on the DASH eating plan, aim to eat more fresh fruits and vegetables, whole grains, lean proteins, low-fat dairy, and heart-healthy fats.  Work with your health care provider or diet and nutrition specialist (dietitian) to adjust your eating plan to your individual   calorie needs. This information is not intended to replace advice given to you by your health care provider. Make sure you discuss any questions you have with your health care provider. Document Released: 07/27/2011 Document Revised: 07/31/2016 Document Reviewed: 07/31/2016 Elsevier Interactive Patient Education  2017 Elsevier Inc.  

## 2017-05-07 NOTE — Assessment & Plan Note (Signed)
hgba1c acceptable, minimize simple carbs. Increase exercise as tolerated.  

## 2017-05-07 NOTE — Assessment & Plan Note (Addendum)
Encouraged DASH diet, decrease po intake and increase exercise as tolerated. Needs 7-8 hours of sleep nightly. Avoid trans fats, eat small, frequent meals every 4-5 hours with lean proteins, complex carbs and healthy fats. Minimize simple carbs, bariatric referral given 

## 2017-05-07 NOTE — Progress Notes (Signed)
Patient ID: David Parsons, male   DOB: 09/14/1940, 76 y.o.   MRN: [id number]   Subjective:    Patient ID: David Parsons, male    DOB: 1941/06/28, 76 y.o.   MRN: [id number]  Chief Complaint  Patient presents with  . Follow-up  . Hypertension    HPI Patient is in today for Follow-up on numerous medical conditions including hypertension, hyperlipidemia, obesity and hyperglycemia. He reports he feels well today. No recent febrile illness or acute hospitalization. He is following with ophthalmology for his glaucoma and sees Dr. Bing Plume. He reports his visual status is unchanged. Overall he feels well but is frustrated with his slow weight loss. Denies CP/palp/SOB/HA/congestion/fevers/GI or GU c/o. Taking meds as prescribed  Past Medical History:  Diagnosis Date  . Aortic insufficiency     02/16/09 Chest CT:  The aortic root has a diameter measuring 4.6 cm, image 64 of the coronal series.  The ascending    thoracic aorta has a diameter of 3.8 cm, image 65 of the coronal series.  At the level of the aortic arch    the thoracic aorta has a maximum diameter of 3.2 cm.  The descending thoracic aorta has a maximum           diameter of 3.2 cm.  There is no evidence for aortic disse        . Aortic root enlargement (HCC)    The aortic root has a diameter measuring 4.6 cm, image 64 of the coronal series.  The ascending  thoracic aorta has a diameter of 3.8 cm, image 65 of the coronal series.  At the level of the aortic arch   the thoracic aorta has a maximum diameter of 3.2 cm.  The descending thoracic aorta has a maximum   diameter of 3.2 cm.  There is no evidence for aortic dissection.       Marland Kitchen CAD (coronary artery disease)   . Cardiomyopathy     01/2009 - Cath - nonobs. dzs.  EF 35%.  . CHF (congestive heart failure) ([place] [facility])   . Diabetes mellitus   . Dyslipidemia 01/14/2013  . Gallstones    s/p cholecystectomy    . GERD (gastroesophageal reflux disease)   . Glaucoma 10/05/2016  . Heart murmur   .  Helicobacter pylori gastritis    (Tx Pylera 4/10)  . Hypertension   . Hypokalemia   . Iron deficiency anemia   . Obesity, Class III, BMI 40-49.9 (morbid obesity) ([place])   . Onychomycosis 03/09/2015  . PONV (postoperative nausea and vomiting)   . 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 Sleep apnea    occasionally wears CPAP at night    Past Surgical History:  Procedure Laterality Date  . ADRENALECTOMY    . BENTALL PROCEDURE  12/28/2011   Procedure: BENTALL PROCEDURE;  Surgeon: Rexene Alberts, MD;  Location: [facility];  Service: Open Heart Surgery;  Laterality: N/A;  . CARDIAC CATHETERIZATION     2013  . CARDIAC VALVE REPLACEMENT  12/28/2011   Bentall with tissue valve  . CHOLECYSTECTOMY    . COLONOSCOPY  09/25/2011  . COLONOSCOPY W/ BIOPSIES AND POLYPECTOMY  11/04/2008   colon polyps, diverticulosis   . CORONARY ARTERY BYPASS GRAFT  12/28/2011  Procedure: CORONARY ARTERY BYPASS GRAFTING (CABG);  Surgeon: Rexene Alberts, MD;  Location: [facility];  Service: Open Heart Surgery;  Laterality: N/A;  cabg x 1  . LEFT AND RIGHT HEART CATHETERIZATION WITH CORONARY ANGIOGRAM N/A 11/22/2011   Procedure: LEFT AND RIGHT HEART CATHETERIZATION WITH CORONARY ANGIOGRAM;  Surgeon: Burnell Blanks, MD;  Location[facility] C[facility] LAB;  Service: Cardiovascular;  Laterality: N/A;  . MULTIPLE TOOTH EXTRACTIONS    . TEE WITHOUT CARDIOVERSION  11/22/2011   Procedure: TRANSESOPHAGEAL ECHOCARDIOGRAM (TEE);  Surgeon: Larey Dresser, MD;  Location: [facility];  Service: Cardiovascular;  Laterality: N/A;  . UPPER GASTROINTESTINAL ENDOSCOPY  11/04/2008   w/bx, H pylori gastritis  . UPPER GASTROINTESTINAL ENDOSCOPY  09/25/2011    Family History  Problem Relation Age of Onset  .  Breast cancer Sister   . Diabetes Sister   . Colon cancer Brother 70       at least 16  . Diabetes Brother   . Cancer Brother        colon, prostate  . Prostate cancer Brother   . Diabetes Brother   . Diabetes Sister   . Cancer Sister        breast, colon, melanoma  . Heart disease Sister   . Hypertension Mother   . Arthritis Mother   . Kidney failure Sister   . Colon cancer Sister   . Breast cancer Sister   . Kidney disease Sister        s/p transplant  . Hypertension Sister   . Diabetes Sister   . Stroke Father   . Hypertension Father     Social History   Social History  . Marital status: Married    Spouse name: [name]  . Number of children: 0  . Years of education: N/A   Occupational History  .  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 Smoking status: Never Smoker  . Smokeless tobacco: Never Used  . Alcohol use No  . Drug use: No  . Sexual activity: Not Currently     Comment: lives with wife, pastros 2 churches   Other Topics Concern  . Not on file   Social History Narrative   The patient is married, lives with his wife in  [place].  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  He  lives a very sedentary lifestyle.  He is a nonsmoker.  He denies alcohol consumption.     Outpatient Medications Prior to Visit  Medication Sig Dispense Refill  . amLODipine (NORVASC) 10 MG tablet TAKE 1 TABLET EVERY DAY 90 tablet 0  . aspirin EC 81 MG tablet Take 81 mg by mouth daily.    Marland Kitchen azelastine (ASTELIN) 0.1 % nasal spray Place 2 sprays into both nostrils 2 (two) times daily. Use in each nostril as directed 30 mL 2  . carvedilol (COREG) 25 MG tablet TAKE 1 TABLET TWICE A DAY WITH FOOD 60 tablet 3  . clobetasol ointment (TEMOVATE) 0.05 % apply to affected area twice a day UPON ITCHING AND IRRITATION(NOT TO FACE OR GROIN)  0  . ferrous sulfate 325 (65 FE) MG tablet Take 325 mg by mouth 2 (two) times daily before  a meal.    . fluticasone (FLONASE) 50 MCG/ACT nasal spray Place 2 sprays into both nostrils daily. 16 g 1  . furosemide (LASIX) 40 MG tablet Take 1 tablet (40 mg total)  by mouth 2 (two) times daily. 180 tablet 0  . HYDROcodone-homatropine (HYCODAN) 5-1.5 MG/5ML syrup Take 5 mLs by mouth every 8 (eight) hours as needed for cough. 120 mL 0  . hydrocortisone 2.5 % cream Apply 1 application topically 2 (two) times daily.   0  . KLOR-CON M20 20 MEQ tablet TAKE 3 TABLETS IN THE MORNING, TAKE 2 TABLETS AT LUNCH AND TAKE 2 TABLETS IN THE EVENING. 196 tablet 0  . levocetirizine (XYZAL) 5 MG tablet TAKE 1 TABLET EVERY EVENING 30 tablet 0  . Multiple Vitamin (MULITIVITAMIN WITH MINERALS) TABS Take 1 tablet by mouth daily.    Marland Kitchen omeprazole (PRILOSEC) 40 MG capsule take 1 capsule by mouth once daily 30 capsule 5  . triamterene-hydrochlorothiazide (MAXZIDE-25) 37.5-25 MG tablet Take 1 tablet by mouth daily. 90 tablet 0  . azithromycin (ZITHROMAX) 250 MG tablet Take 2 tablets by mouth on day 1, followed by 1 tablet by mouth daily for 4 days. 6 tablet 0  . benzonatate (TESSALON) 100 MG capsule Take 1 capsule (100 mg total) by mouth 3 (three) times daily as needed for cough. 21 capsule 0  . losartan (COZAAR) 100 MG tablet Take 1 tablet (100 mg total) by mouth daily. 30 tablet 11   No facility-administered medications prior to visit.     Allergies  Allergen Reactions  . Latex Hives    Latex gloves     Review of Systems  Constitutional: Negative for fever and malaise/fatigue.  HENT: Negative for congestion.   Eyes: Negative for blurred vision.  Respiratory: Negative for shortness of breath.   Cardiovascular: Negative for chest pain, palpitations and leg swelling.  Gastrointestinal: Negative for abdominal pain, blood in stool and nausea.  Genitourinary: Negative for dysuria and frequency.  Musculoskeletal: Negative for falls.  Skin: Negative for rash.  Neurological: Negative for dizziness, loss of  consciousness and headaches.  Endo/Heme/Allergies: Negative for environmental allergies.  Psychiatric/Behavioral: Negative for depression. The patient is not nervous/anxious.        Objective:    Physical Exam  Constitutional: He is oriented to person, place, and time. He appears well-developed and well-nourished. No distress.  HENT:  Head: Normocephalic and atraumatic.  Nose: Nose normal.  Eyes: Right eye exhibits no discharge. Left eye exhibits no discharge.  Neck: Normal range of motion. Neck supple.  Cardiovascular: Normal rate and regular rhythm.   No murmur heard. Pulmonary/Chest: Effort normal and breath sounds normal.  Abdominal: Soft. Bowel sounds are normal. There is no tenderness.  Musculoskeletal: He exhibits no edema.  Neurological: He is alert and oriented to person, place, and time.  Skin: Skin is warm and dry.  Psychiatric: He has a normal mood and affect.  Nursing note and vitals reviewed.   BP (!) 142/74   Pulse 75   Temp 97.7 F (36.5 C) (Oral)   Ht 6\' 1"  (1.854 m)   Wt (!) 330 lb (149.7 kg)   SpO2 98%   BMI 43.54 kg/m  Wt Readings from Last 3 Encounters:  05/07/17 (!) 330 lb (149.7 kg)  12/20/16 (!) 333 lb 12.8 oz (151.4 kg)  11/09/16 (!) 330 lb (149.7 kg)     Lab Results  Component Value Date   WBC 8.6 10/05/2016   HGB 14.5 10/05/2016   HCT 43.4 10/05/2016   PLT 267.0 10/05/2016   GLUCOSE 120 (H) 10/26/2016   CHOL 162 10/05/2016   TRIG 139.0 10/05/2016   HDL 36.40 (L) 10/05/2016   LDLCALC 97 10/05/2016   ALT  13 10/26/2016   AST 15 10/26/2016   NA 139 10/26/2016   K 4.4 10/26/2016   CL 103 10/26/2016   CREATININE 1.00 10/26/2016   BUN 9 10/26/2016   CO2 31 10/26/2016   TSH 1.88 10/05/2016   PSA 7.32 (H) 09/07/2010   INR 1.21 01/02/2012   HGBA1C 5.7 10/05/2016    Lab Results  Component Value Date   TSH 1.88 10/05/2016   Lab Results  Component Value Date   WBC 8.6 10/05/2016   HGB 14.5 10/05/2016   HCT 43.4 10/05/2016   MCV  88.8 10/05/2016   PLT 267.0 10/05/2016   Lab Results  Component Value Date   NA 139 10/26/2016   K 4.4 10/26/2016   CO2 31 10/26/2016   GLUCOSE 120 (H) 10/26/2016   BUN 9 10/26/2016   CREATININE 1.00 10/26/2016   BILITOT 0.9 10/26/2016   ALKPHOS 76 10/26/2016   AST 15 10/26/2016   ALT 13 10/26/2016   PROT 7.0 10/26/2016   ALBUMIN 3.8 10/26/2016   CALCIUM 9.4 10/26/2016   GFR 93.76 10/26/2016   Lab Results  Component Value Date   CHOL 162 10/05/2016   Lab Results  Component Value Date   HDL 36.40 (L) 10/05/2016   Lab Results  Component Value Date   LDLCALC 97 10/05/2016   Lab Results  Component Value Date   TRIG 139.0 10/05/2016   Lab Results  Component Value Date   CHOLHDL 4 10/05/2016   Lab Results  Component Value Date   HGBA1C 5.7 10/05/2016       Assessment & Plan:   Problem List Items Addressed This Visit    Obesity, Class III, BMI 40-49.9 (morbid obesity) ([place])    Encouraged DASH diet, decrease po intake and increase exercise as tolerated. Needs 7-8 hours of sleep nightly. Avoid trans fats, eat small, frequent meals every 4-5 hours with lean proteins, complex carbs and healthy fats. Minimize simple carbs, bariatric referral given      Essential hypertension    Well controlled, no changes to meds. Encouraged heart healthy diet such as the DASH diet and exercise as tolerated.       Hyperglycemia    hgba1c acceptable, minimize simple carbs. Increase exercise as tolerated.       S/P CABG x 1    Feeling very well and following with Dr Angelena Form      Dyslipidemia    Encouraged heart healthy diet, increase exercise, avoid trans fats, consider a krill oil cap daily       Other Visit Diagnoses    Need for immunization against influenza    -  Primary   Relevant Orders   Flu vaccine HIGH DOSE PF (Fluzone High dose) (Completed)      I have discontinued Mr. Buttram's azithromycin and benzonatate. I am also having him maintain his ferrous sulfate,  multivitamin with minerals, aspirin EC, clobetasol ointment, hydrocortisone, fluticasone, losartan, omeprazole, azelastine, HYDROcodone-homatropine, carvedilol, furosemide, levocetirizine, triamterene-hydrochlorothiazide, KLOR-CON M20, and amLODipine.  No orders of the defined types were placed in this encounter.    [name], MD

## 2017-05-08 ENCOUNTER — Encounter: Payer: Self-pay | Admitting: Podiatry

## 2017-05-08 ENCOUNTER — Ambulatory Visit (INDEPENDENT_AMBULATORY_CARE_PROVIDER_SITE_OTHER): Payer: Medicare Other | Admitting: Podiatry

## 2017-05-08 DIAGNOSIS — M79609 Pain in unspecified limb: Secondary | ICD-10-CM

## 2017-05-08 DIAGNOSIS — B351 Tinea unguium: Secondary | ICD-10-CM | POA: Diagnosis not present

## 2017-05-08 NOTE — Progress Notes (Signed)
Patient ID: David Parsons, male   DOB: [birth year], 76 y.o.   MRN: [id number] Complaint:  Visit Type: Patient returns to my office for continued preventative foot care services. Complaint: Patient states" my nails have grown long and thick and become painful to walk and wear shoes" . He presents for preventative foot care services. No changes to ROS  Podiatric Exam: Vascular: dorsalis pedis and posterior tibial pulses are palpable bilateral. Capillary return is immediate. Temperature gradient is WNL. Skin turgor WNL  Sensorium: Normal Semmes Weinstein monofilament test. Normal tactile sensation bilaterally. Nail Exam: Pt has thick disfigured discolored nails with subungual debris noted bilateral entire nail hallux through fifth toenails Ulcer Exam: There is no evidence of ulcer or pre-ulcerative changes or infection. Orthopedic Exam: Muscle tone and strength are WNL. No limitations in general ROM. No crepitus or effusions noted. Foot type and digits show no abnormalities. Bony prominences are unremarkable. Skin: No Porokeratosis. No infection or ulcers  Diagnosis:  Tinea unguium, Pain in right toe, pain in left toes  Treatment & Plan Procedures and Treatment: Consent by patient was obtained for treatment procedures. The patient understood the discussion of treatment and procedures well. All questions were answered thoroughly reviewed. Debridement of mycotic and hypertrophic toenails, 1 through 5 bilateral and clearing of subungual debris. No ulceration, no infection noted.  Return Visit-Office Procedure: Patient instructed to return to the office for a follow up visit 3 months for continued evaluation and treatment.  Gardiner Barefoot DPM

## 2017-05-22 ENCOUNTER — Other Ambulatory Visit: Payer: Self-pay | Admitting: Family Medicine

## 2017-06-04 ENCOUNTER — Other Ambulatory Visit: Payer: Self-pay | Admitting: Family Medicine

## 2017-06-06 ENCOUNTER — Other Ambulatory Visit: Payer: Self-pay | Admitting: Cardiovascular Disease

## 2017-06-06 DIAGNOSIS — I5033 Acute on chronic diastolic (congestive) heart failure: Secondary | ICD-10-CM

## 2017-06-11 ENCOUNTER — Telehealth: Payer: Self-pay | Admitting: Family Medicine

## 2017-06-11 NOTE — Telephone Encounter (Signed)
Called Mr. Geigle to schedule AWV. Lvm for pt to call office to schedule appt.   *Last AWV 08/01/16; Pt can schedule appt after 08/01/17. SF

## 2017-06-12 ENCOUNTER — Telehealth: Payer: Self-pay | Admitting: Family Medicine

## 2017-06-12 NOTE — Telephone Encounter (Signed)
Good memory, he needs to continue taking the prescribed potassium and come back for a cmp check

## 2017-06-12 NOTE — Telephone Encounter (Signed)
Please advise 

## 2017-06-12 NOTE — Telephone Encounter (Signed)
Pt's spouse called in. She said that it seems like she recall the provider stating that the pt need to have a potassium check but she's not sure. Please advise is pt due to come back for a re-check on potassium?

## 2017-06-15 NOTE — Telephone Encounter (Signed)
Called patient left vmail to call the ofc back

## 2017-06-25 ENCOUNTER — Other Ambulatory Visit: Payer: Self-pay | Admitting: Family Medicine

## 2017-06-28 ENCOUNTER — Ambulatory Visit (INDEPENDENT_AMBULATORY_CARE_PROVIDER_SITE_OTHER): Payer: Medicare Other | Admitting: Ophthalmology

## 2017-06-28 DIAGNOSIS — H2513 Age-related nuclear cataract, bilateral: Secondary | ICD-10-CM

## 2017-06-28 DIAGNOSIS — H35033 Hypertensive retinopathy, bilateral: Secondary | ICD-10-CM

## 2017-06-28 DIAGNOSIS — H43813 Vitreous degeneration, bilateral: Secondary | ICD-10-CM

## 2017-06-28 DIAGNOSIS — I10 Essential (primary) hypertension: Secondary | ICD-10-CM

## 2017-06-28 DIAGNOSIS — H348312 Tributary (branch) retinal vein occlusion, right eye, stable: Secondary | ICD-10-CM

## 2017-06-29 ENCOUNTER — Ambulatory Visit: Payer: Medicare Other | Admitting: *Deleted

## 2017-07-02 ENCOUNTER — Other Ambulatory Visit: Payer: Self-pay | Admitting: *Deleted

## 2017-07-02 MED ORDER — LOSARTAN POTASSIUM 100 MG PO TABS: 100.0000 mg | ORAL_TABLET | Freq: Every day | ORAL | 0 refills | Status: DC

## 2017-07-23 ENCOUNTER — Other Ambulatory Visit: Payer: Self-pay | Admitting: Family Medicine

## 2017-08-05 ENCOUNTER — Other Ambulatory Visit: Payer: Self-pay | Admitting: Family Medicine

## 2017-08-07 ENCOUNTER — Ambulatory Visit (INDEPENDENT_AMBULATORY_CARE_PROVIDER_SITE_OTHER): Payer: Medicare Other | Admitting: Podiatry

## 2017-08-07 ENCOUNTER — Other Ambulatory Visit (INDEPENDENT_AMBULATORY_CARE_PROVIDER_SITE_OTHER): Payer: Medicare Other

## 2017-08-07 ENCOUNTER — Encounter: Payer: Self-pay | Admitting: Podiatry

## 2017-08-07 DIAGNOSIS — M79609 Pain in unspecified limb: Secondary | ICD-10-CM

## 2017-08-07 DIAGNOSIS — B351 Tinea unguium: Secondary | ICD-10-CM

## 2017-08-07 DIAGNOSIS — E876 Hypokalemia: Secondary | ICD-10-CM | POA: Diagnosis not present

## 2017-08-07 LAB — POTASSIUM: POTASSIUM: 3.4 meq/L — AB (ref 3.5–5.1)

## 2017-08-07 NOTE — Progress Notes (Signed)
Patient ID: David Parsons, male   DOB: 06-17-1941, 76 y.o.   MRN: [id number] Complaint:  Visit Type: Patient returns to my office for continued preventative foot care services. Complaint: Patient states" my nails have grown long and thick and become painful to walk and wear shoes" . He presents for preventative foot care services. No changes to ROS  Podiatric Exam: Vascular: dorsalis pedis and posterior tibial pulses are palpable bilateral. Capillary return is immediate. Temperature gradient is WNL. Skin turgor WNL  Sensorium: Normal Semmes Weinstein monofilament test. Normal tactile sensation bilaterally. Nail Exam: Pt has thick disfigured discolored nails with subungual debris noted bilateral entire nail hallux through fifth toenails Ulcer Exam: There is no evidence of ulcer or pre-ulcerative changes or infection. Orthopedic Exam: Muscle tone and strength are WNL. No limitations in general ROM. No crepitus or effusions noted. Foot type and digits show no abnormalities. Bony prominences are unremarkable. Skin: No Porokeratosis. No infection or ulcers  Diagnosis:  Tinea unguium, Pain in right toe, pain in left toes  Treatment & Plan Procedures and Treatment: Consent by patient was obtained for treatment procedures. The patient understood the discussion of treatment and procedures well. All questions were answered thoroughly reviewed. Debridement of mycotic and hypertrophic toenails, 1 through 5 bilateral and clearing of subungual debris. No ulceration, no infection noted.  Return Visit-Office Procedure: Patient instructed to return to the office for a follow up visit 3 months for continued evaluation and treatment.  Gardiner Barefoot DPM

## 2017-08-08 NOTE — Progress Notes (Signed)
Subjective:   David Parsons is a 76 y.o. male who presents for Medicare Annual/Subsequent preventive examination.  Review of Systems: No ROS.  Medicare Wellness Visit. Additional risk factors are reflected in the social history. Cardiac Risk Factors include: advanced age (>64men, >63 women);dyslipidemia;hypertension;male gender;obesity (BMI >30kg/m2) Sleep patterns: Sleeps well 6 hrs per night. Feels rested.  Home Safety/Smoke Alarms: Feels safe in home. Smoke alarms in place. Lives with wife in 1 story home.  Male:   CCS- Last 09/25/11: severe diverticulosis    PSA-  Lab Results  Component Value Date   PSA 7.32 (H) 09/07/2010       Objective:    Vitals: BP 138/80 (BP Location: Left Arm, Patient Position: Sitting, Cuff Size: Large)   Pulse 73   Ht 6\' 1"  (1.854 m)   Wt (!) 341 lb (154.7 kg)   SpO2 97%   BMI 44.99 kg/m   Body mass index is 44.99 kg/m.  Advanced Directives 08/09/2017 08/01/2016 04/22/2015 04/20/2014 12/28/2011 12/26/2011 11/22/2011  Does Patient Have a Medical Advance Directive? No No No No Patient does not have advance directive Patient does not have advance directive Patient does not have advance directive  Does patient want to make changes to medical advance directive? - Yes (MAU/Ambulatory/Procedural Areas - Information given) - - - - -  Would patient like information on creating a medical advance directive? Yes (MAU/Ambulatory/Procedural Areas - Information given) - Yes - Educational materials given Yes - Scientist, clinical (histocompatibility and immunogenetics) given - - -  Pre-existing out of facility DNR order (yellow form or pink MOST form) - - - - No - No    Tobacco Social History   Tobacco Use  Smoking Status Never Smoker  Smokeless Tobacco Never Used     Counseling given: Not Answered   Clinical Intake: Pain : No/denies pain  Past Medical History:  Diagnosis Date  . Aortic insufficiency     02/16/09 Chest CT:  The aortic root has a diameter measuring 4.6 cm, image 64 of the  coronal series.  The ascending    thoracic aorta has a diameter of 3.8 cm, image 65 of the coronal series.  At the level of the aortic arch    the thoracic aorta has a maximum diameter of 3.2 cm.  The descending thoracic aorta has a maximum           diameter of 3.2 cm.  There is no evidence for aortic disse        . Aortic root enlargement (HCC)    The aortic root has a diameter measuring 4.6 cm, image 64 of the coronal series.  The ascending  thoracic aorta has a diameter of 3.8 cm, image 65 of the coronal series.  At the level of the aortic arch   the thoracic aorta has a maximum diameter of 3.2 cm.  The descending thoracic aorta has a maximum   diameter of 3.2 cm.  There is no evidence for aortic dissection.       Marland Kitchen CAD (coronary artery disease)   . Cardiomyopathy     01/2009 - Cath - nonobs. dzs.  EF 35%.  . CHF (congestive heart failure) ([place])   . Diabetes mellitus   . Dyslipidemia 01/14/2013  . Gallstones    s/p cholecystectomy    . GERD (gastroesophageal reflux disease)   . Glaucoma 10/05/2016  . Heart murmur   . Helicobacter pylori gastritis    (Tx Pylera 4/10)  . Hypertension   . Hypokalemia   .  Iron deficiency anemia   . Obesity, Class III, BMI 40-49.9 (morbid obesity) ([place])   . Onychomycosis 03/09/2015  . PONV (postoperative nausea and vomiting)   . 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 Sleep apnea    occasionally wears CPAP at night   Past Surgical History:  Procedure Laterality Date  . ADRENALECTOMY    . BENTALL PROCEDURE  12/28/2011   Procedure: BENTALL PROCEDURE;  Surgeon: Rexene Alberts, MD;  Location: [facility];  Service: Open Heart Surgery;  Laterality: N/A;  . CARDIAC CATHETERIZATION     2013  . CARDIAC VALVE REPLACEMENT   12/28/2011   Bentall with tissue valve  . CHOLECYSTECTOMY    . COLONOSCOPY  09/25/2011  . COLONOSCOPY W/ BIOPSIES AND POLYPECTOMY  11/04/2008   colon polyps, diverticulosis   . CORONARY ARTERY BYPASS GRAFT  12/28/2011   Procedure: CORONARY ARTERY BYPASS GRAFTING (CABG);  Surgeon: Rexene Alberts, MD;  Location: [facility];  Service: Open Heart Surgery;  Laterality: N/A;  cabg x 1  . LEFT AND RIGHT HEART CATHETERIZATION WITH CORONARY ANGIOGRAM N/A 11/22/2011   Procedure: LEFT AND RIGHT HEART CATHETERIZATION WITH CORONARY ANGIOGRAM;  Surgeon: Burnell Blanks, MD;  Location[facility] CATH LAB;  Service: Cardiovascular;  Laterality: N/A;  . MULTIPLE TOOTH EXTRACTIONS    . TEE WITHOUT CARDIOVERSION  11/22/2011   Procedure: TRANSESOPHAGEAL ECHOCARDIOGRAM (TEE);  Surgeon: Larey Dresser, MD;  Location: [place];  Service: Cardiovascular;  Laterality: N/A;  . UPPER GASTROINTESTINAL ENDOSCOPY  11/04/2008   w/bx, H pylori gastritis  . UPPER GASTROINTESTINAL ENDOSCOPY  09/25/2011   Family History  Problem Relation Age of Onset  . Breast cancer Sister   . Diabetes Sister   . Colon cancer Brother 70       at least 54  . Diabetes Brother   . Cancer Brother        colon, prostate  . Prostate cancer Brother   . Diabetes Brother   . Diabetes Sister   . Cancer Sister        breast, colon, melanoma  . Heart disease Sister   . Hypertension Mother   . Arthritis Mother   . Kidney failure Sister   . Colon cancer Sister   . Breast cancer Sister   . Kidney disease Sister        s/p transplant  . Hypertension Sister   . Diabetes Sister   . Stroke Father   . Hypertension Father    Social History   Socioeconomic History  . Marital status: Married    Spouse name: [name]  . Number of children: 0  . Years of education: None  . Highest education level: None  Social Needs  . Financial resource strain: None  . Food insecurity - worry: None  . Food insecurity - inability: None  . Transportation needs -  medical: None  . Transportation needs - non-medical: None  Occupational History    Employer: [employer]    Comment: Owner of [employer]  Tobacco Use  . Smoking status: Never Smoker  . Smokeless tobacco: Never Used  Substance and Sexual Activity  . Alcohol use: No  . Drug use: No  . Sexual activity: Not Currently    Comment:  lives with wife, [name] [employer] churches  Other Topics Concern  . None  Social History Narrative   The patient is married, lives with his wife in  [place].  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  He  lives a very sedentary lifestyle.  He is a nonsmoker.  He denies alcohol consumption.     Outpatient Encounter Medications as of 08/09/2017  Medication Sig  . amLODipine (NORVASC) 10 MG tablet TAKE 1 TABLET EVERY DAY  . aspirin EC 81 MG tablet Take 81 mg by mouth daily.  Marland Kitchen azelastine (ASTELIN) 0.1 % nasal spray Place 2 sprays into both nostrils 2 (two) times daily. Use in each nostril as directed  . carvedilol (COREG) 25 MG tablet TAKE 1 TABLET TWICE A DAY WITH FOOD  . clobetasol ointment (TEMOVATE) 0.05 % apply to affected area twice a day UPON ITCHING AND IRRITATION(NOT TO FACE OR GROIN)  . ferrous sulfate 325 (65 FE) MG tablet Take 325 mg by mouth 2 (two) times daily before a meal.  . fluticasone (FLONASE) 50 MCG/ACT nasal spray Place 2 sprays into both nostrils daily.  . furosemide (LASIX) 40 MG tablet Take 1 tablet (40 mg total) by mouth 2 (two) times daily. Please call and schedule a one year follow up appointment  . HYDROcodone-homatropine (HYCODAN) 5-1.5 MG/5ML syrup Take 5 mLs by mouth every 8 (eight) hours as needed for cough.  . hydrocortisone 2.5 % cream Apply 1 application topically 2 (two) times daily.   Marland Kitchen KLOR-CON M20 20 MEQ tablet TAKE 3 TABLETS IN THE MORNING, TAKE 2 TABLETS AT LUNCH AND TAKE 2 TABLETS IN THE EVENING.  Marland Kitchen losartan (COZAAR) 100 MG tablet Take 1 tablet (100 mg total) daily by mouth.  . Multiple Vitamin  (MULITIVITAMIN WITH MINERALS) TABS Take 1 tablet by mouth daily.  Marland Kitchen omeprazole (PRILOSEC) 40 MG capsule TAKE ONE CAPSULE EVERY DAY  . triamterene-hydrochlorothiazide (MAXZIDE-25) 37.5-25 MG tablet TAKE 1 TABLET BY MOUTH EVERY DAY  . levocetirizine (XYZAL) 5 MG tablet TAKE 1 TABLET EVERY EVENING (Patient not taking: Reported on 08/09/2017)   No facility-administered encounter medications on file as of 08/09/2017.     Activities of Daily Living In your present state of health, do you have any difficulty performing the following activities: 08/09/2017  Hearing? N  Vision? N  Comment wears reading glasses. Follows with Dr.Digby for glaucoma every 3 months.  Difficulty concentrating or making decisions? N  Walking or climbing stairs? N  Dressing or bathing? N  Doing errands, shopping? N  Preparing Food and eating ? N  Using the Toilet? N  In the past six months, have you accidently leaked urine? N  Do you have problems with loss of bowel control? N  Managing your Medications? N  Managing your Finances? N  Housekeeping or managing your Housekeeping? N  Some recent data might be hidden    Patient Care Team: [name], MD as PCP - General (Family Medicine) Burnell Blanks, MD as Consulting Physician (Cardiology) Gatha Mayer, MD as Consulting Physician (Gastroenterology) Druscilla Brownie, MD as Consulting Physician (Dermatology) Calvert Cantor, MD as Consulting Physician (Ophthalmology) Carolan Clines, MD as Consulting Physician (Urology) Gardiner Barefoot, DPM as Consulting Physician (Podiatry)   Assessment:   This is a routine wellness examination for [employer]. Physical assessment deferred to PCP.  Exercise Activities and Dietary recommendations Current Exercise Habits: Home exercise routine, Type of exercise: walking, Time (Minutes): 10, Frequency (Times/Week): 7, Weekly Exercise (Minutes/Week): 70, Exercise limited  by: None identified Diet (meal preparation, eat  out, water intake, caffeinated beverages, dairy products, fruits and vegetables): in general, an "unhealthy" diet  Goals    . join weight loss program (pt-stated)    . Weight (lb) < 300 lb (136.1 kg)       Fall Risk Fall Risk  08/09/2017 08/01/2016 04/22/2015 04/20/2014 05/09/2013  Falls in the past year? No No No No No    Depression Screen PHQ 2/9 Scores 08/09/2017 08/01/2016 04/22/2015 04/20/2014  PHQ - 2 Score 0 0 0 0    Cognitive Function MMSE - Mini Mental State Exam 08/09/2017 08/01/2016  Orientation to time 5 5  Orientation to Place 4 5  Registration 3 3  Attention/ Calculation 5 3  Recall 2 2  Language- name 2 objects 2 2  Language- repeat 1 1  Language- follow 3 step command 3 3  Language- read & follow direction 1 1  Write a sentence 1 1  Copy design 1 1  Total score 28 27        Immunization History  Administered Date(s) Administered  . Influenza Split 05/15/2011, 05/22/2012  . Influenza Whole 06/07/2009, 05/11/2010  . Influenza, High Dose Seasonal PF 04/22/2015, 05/07/2017  . Influenza,inj,Quad PF,6+ Mos 05/09/2013, 04/20/2014  . Influenza-Unspecified 05/17/2016  . Pneumococcal Conjugate-13 05/09/2013  . Pneumococcal Polysaccharide-23 04/16/2006, 04/22/2015  . Td 04/14/2008     Screening Tests Health Maintenance  Topic Date Due  . TETANUS/TDAP  04/14/2018  . COLONOSCOPY  09/24/2021  . INFLUENZA VACCINE  Completed  . PNA vac Low Risk Adult  Completed    Plan:   Follow up with Dr.Blyth as scheduled 11/06/17  Continue to eat heart healthy diet (full of fruits, vegetables, whole grains, lean protein, water--limit salt, fat, and sugar intake) and increase physical activity as tolerated.  Continue doing brain stimulating activities (puzzles, reading, adult coloring books, staying active) to keep memory sharp.   Bring a copy of your living will and/or healthcare power of attorney to your next office visit.   I have personally reviewed and noted the  following in the patient's chart:   . Medical and social history . Use of alcohol, tobacco or illicit drugs  . Current medications and supplements . Functional ability and status . Nutritional status . Physical activity . Advanced directives . List of other physicians . Hospitalizations, surgeries, and ER visits in previous 12 months . Vitals . Screenings to include cognitive, depression, and falls . Referrals and appointments  In addition, I have reviewed and discussed with patient certain preventive protocols, quality metrics, and best practice recommendations. A written personalized care plan for preventive services as well as general preventive health recommendations were provided to patient.     Shela Nevin, [place]  08/09/2017

## 2017-08-09 ENCOUNTER — Ambulatory Visit (INDEPENDENT_AMBULATORY_CARE_PROVIDER_SITE_OTHER): Payer: Medicare Other | Admitting: *Deleted

## 2017-08-09 ENCOUNTER — Encounter: Payer: Self-pay | Admitting: *Deleted

## 2017-08-09 VITALS — BP 138/80 | HR 73 | Ht 73.0 in | Wt 341.0 lb

## 2017-08-09 DIAGNOSIS — Z0000 Encounter for general adult medical examination without abnormal findings: Secondary | ICD-10-CM | POA: Diagnosis not present

## 2017-08-09 NOTE — Patient Instructions (Signed)
Follow up with Dr.Blyth as scheduled 11/06/17  Continue to eat heart healthy diet (full of fruits, vegetables, whole grains, lean protein, water--limit salt, fat, and sugar intake) and increase physical activity as tolerated.  Continue doing brain stimulating activities (puzzles, reading, adult coloring books, staying active) to keep memory sharp.   Bring a copy of your living will and/or healthcare power of attorney to your next office visit.   [name] , Thank you for taking time to come for your Medicare Wellness Visit. I appreciate your ongoing commitment to your health goals. Please review the following plan we discussed and let me know if I can assist you in the future.   These are the goals we discussed: Goals    . join weight loss program (pt-stated)    . Weight (lb) < 300 lb (136.1 kg)       This is a list of the screening recommended for you and due dates:  Health Maintenance  Topic Date Due  . Tetanus Vaccine  04/14/2018  . Colon Cancer Screening  09/24/2021  . Flu Shot  Completed  . Pneumonia vaccines  Completed   Potassium Content of Foods Potassium is a mineral found in many foods and drinks. It helps keep fluids and minerals balanced in your body and affects how steadily your heart beats. Potassium also helps control your blood pressure and keep your muscles and nervous system healthy. Certain health conditions and medicines may change the balance of potassium in your body. When this happens, you can help balance your level of potassium through the foods that you do or do not eat. Your health care provider or dietitian may recommend an amount of potassium that you should have each day. The following lists of foods provide the amount of potassium (in parentheses) per serving in each item. High in potassium The following foods and beverages have 200 mg or more of potassium per serving:  Apricots, 2 raw or 5 dry (200 mg).  Artichoke, 1 medium (345 mg).  Avocado, raw,   each (245 mg).  Banana, 1 medium (425 mg).  Beans, lima, or baked beans, canned,  cup (280 mg).  Beans, white, canned,  cup (595 mg).  Beef roast, 3 oz (320 mg).  Beef, ground, 3 oz (270 mg).  Beets, raw or cooked,  cup (260 mg).  Bran muffin, 2 oz (300 mg).  Broccoli,  cup (230 mg).  Brussels sprouts,  cup (250 mg).  Cantaloupe,  cup (215 mg).  Cereal, 100% bran,  cup (200-400 mg).  Cheeseburger, single, fast food, 1 each (225-400 mg).  Chicken, 3 oz (220 mg).  Clams, canned, 3 oz (535 mg).  Crab, 3 oz (225 mg).  Dates, 5 each (270 mg).  Dried beans and peas,  cup (300-475 mg).  Figs, dried, 2 each (260 mg).  Fish: halibut, tuna, cod, snapper, 3 oz (480 mg).  Fish: salmon, haddock, swordfish, perch, 3 oz (300 mg).  Fish, tuna, canned 3 oz (200 mg).  [place] fries, fast food, 3 oz (470 mg).  Granola with fruit and nuts,  cup (200 mg).  Grapefruit juice,  cup (200 mg).  Greens, beet,  cup (655 mg).  Honeydew melon,  cup (200 mg).  Kale, raw, 1 cup (300 mg).  Kiwi, 1 medium (240 mg).  Kohlrabi, rutabaga, parsnips,  cup (280 mg).  Lentils,  cup (365 mg).  Mango, 1 each (325 mg).  Milk, chocolate, 1 cup (420 mg).  Milk: nonfat, low-fat, whole, buttermilk, 1  cup (350-380 mg).  Molasses, 1 Tbsp (295 mg).  Mushrooms,  cup (280) mg.  Nectarine, 1 each (275 mg).  Nuts: almonds, peanuts, hazelnuts, [place], cashew, mixed, 1 oz (200 mg).  Nuts, pistachios, 1 oz (295 mg).  Orange, 1 each (240 mg).  Orange juice,  cup (235 mg).  Papaya, medium,  fruit (390 mg).  Peanut butter, chunky, 2 Tbsp (240 mg).  Peanut butter, smooth, 2 Tbsp (210 mg).  Pear, 1 medium (200 mg).  Pomegranate, 1 whole (400 mg).  Pomegranate juice,  cup (215 mg).  Pork, 3 oz (350 mg).  Potato chips, salted, 1 oz (465 mg).  Potato, baked with skin, 1 medium (925 mg).  Potatoes, boiled,  cup (255 mg).  Potatoes, mashed,  cup (330 mg).  Prune  juice,  cup (370 mg).  Prunes, 5 each (305 mg).  Pudding, chocolate,  cup (230 mg).  Pumpkin, canned,  cup (250 mg).  Raisins, seedless,  cup (270 mg).  Seeds, sunflower or pumpkin, 1 oz (240 mg).  Soy milk, 1 cup (300 mg).  Spinach,  cup (420 mg).  Spinach, canned,  cup (370 mg).  Sweet potato, baked with skin, 1 medium (450 mg).  Swiss chard,  cup (480 mg).  Tomato or vegetable juice,  cup (275 mg).  Tomato sauce or puree,  cup (400-550 mg).  Tomato, raw, 1 medium (290 mg).  Tomatoes, canned,  cup (200-300 mg).  [place], 3 oz (250 mg).  Wheat germ, 1 oz (250 mg).  Winter squash,  cup (250 mg).  Yogurt, plain or fruited, 6 oz (260-435 mg).  Zucchini,  cup (220 mg).  Moderate in potassium The following foods and beverages have 50-200 mg of potassium per serving:  Apple, 1 each (150 mg).  Apple juice,  cup (150 mg).  Applesauce,  cup (90 mg).  Apricot nectar,  cup (140 mg).  Asparagus, small spears,  cup or 6 spears (155 mg).  Bagel, cinnamon raisin, 1 each (130 mg).  Bagel, egg or plain, 4 in., 1 each (70 mg).  Beans, green,  cup (90 mg).  Beans, yellow,  cup (190 mg).  Beer, regular, 12 oz (100 mg).  Beets, canned,  cup (125 mg).  Blackberries,  cup (115 mg).  Blueberries,  cup (60 mg).  Bread, whole wheat, 1 slice (70 mg).  Broccoli, raw,  cup (145 mg).  Cabbage,  cup (150 mg).  Carrots, cooked or raw,  cup (180 mg).  Cauliflower, raw,  cup (150 mg).  Celery, raw,  cup (155 mg).  Cereal, bran flakes, cup (120-150 mg).  Cheese, cottage,  cup (110 mg).  Cherries, 10 each (150 mg).  Chocolate, 1 oz bar (165 mg).  Coffee, brewed 6 oz (90 mg).  Corn,  cup or 1 ear (195 mg).  Cucumbers,  cup (80 mg).  Egg, large, 1 each (60 mg).  Eggplant,  cup (60 mg).  Endive, raw, cup (80 mg).  English muffin, 1 each (65 mg).  Fish, orange roughy, 3 oz (150 mg).  Frankfurter, beef or pork, 1 each  (75 mg).  Fruit cocktail,  cup (115 mg).  Grape juice,  cup (170 mg).  Grapefruit,  fruit (175 mg).  Grapes,  cup (155 mg).  Greens: kale, turnip, collard,  cup (110-150 mg).  Ice cream or frozen yogurt, chocolate,  cup (175 mg).  Ice cream or frozen yogurt, vanilla,  cup (120-150 mg).  Lemons, limes, 1 each (80 mg).  Lettuce, all types, 1 cup (100  mg).  Mixed vegetables,  cup (150 mg).  Mushrooms, raw,  cup (110 mg).  Nuts: walnuts, pecans, or macadamia, 1 oz (125 mg).  Oatmeal,  cup (80 mg).  Okra,  cup (110 mg).  Onions, raw,  cup (120 mg).  Peach, 1 each (185 mg).  Peaches, canned,  cup (120 mg).  Pears, canned,  cup (120 mg).  Peas, green, frozen,  cup (90 mg).  Peppers, green,  cup (130 mg).  Peppers, red,  cup (160 mg).  Pineapple juice,  cup (165 mg).  Pineapple, fresh or canned,  cup (100 mg).  Plums, 1 each (105 mg).  Pudding, vanilla,  cup (150 mg).  Raspberries,  cup (90 mg).  Rhubarb,  cup (115 mg).  Rice, wild,  cup (80 mg).  Shrimp, 3 oz (155 mg).  Spinach, raw, 1 cup (170 mg).  Strawberries,  cup (125 mg).  Summer squash  cup (175-200 mg).  Swiss chard, raw, 1 cup (135 mg).  Tangerines, 1 each (140 mg).  Tea, brewed, 6 oz (65 mg).  Turnips,  cup (140 mg).  Watermelon,  cup (85 mg).  Wine, red, table, 5 oz (180 mg).  Wine, white, table, 5 oz (100 mg).  Low in potassium The following foods and beverages have less than 50 mg of potassium per serving.  Bread, white, 1 slice (30 mg).  Carbonated beverages, 12 oz (less than 5 mg).  Cheese, 1 oz (20-30 mg).  Cranberries,  cup (45 mg).  Cranberry juice cocktail,  cup (20 mg).  Fats and oils, 1 Tbsp (less than 5 mg).  Hummus, 1 Tbsp (32 mg).  Nectar: papaya, mango, or pear,  cup (35 mg).  Rice, white or brown,  cup (50 mg).  Spaghetti or macaroni,  cup cooked (30 mg).  Tortilla, flour or corn, 1 each (50 mg).  Waffle, 4 in.,  1 each (50 mg).  Water chestnuts,  cup (40 mg).  This information is not intended to replace advice given to you by your health care provider. Make sure you discuss any questions you have with your health care provider. Document Released: 03/21/2005 Document Revised: 01/13/2016 Document Reviewed: 07/04/2013 Elsevier Interactive Patient Education  2018 [employer] Maintenance, Male A healthy lifestyle and preventive care is important for your health and wellness. Ask your health care provider about what schedule of regular examinations is right for you. What should I know about weight and diet? Eat a Healthy Diet  Eat plenty of vegetables, fruits, whole grains, low-fat dairy products, and lean protein.  Do not eat a lot of foods high in solid fats, added sugars, or salt.  Maintain a Healthy Weight Regular exercise can help you achieve or maintain a healthy weight. You should:  Do at least 150 minutes of exercise each week. The exercise should increase your heart rate and make you sweat (moderate-intensity exercise).  Do strength-training exercises at least twice a week.  Watch Your Levels of Cholesterol and Blood Lipids  Have your blood tested for lipids and cholesterol every 5 years starting at 75 years of age. If you are at high risk for heart disease, you should start having your blood tested when you are 75 years old. You may need to have your cholesterol levels checked more often if: ? Your lipid or cholesterol levels are high. ? You are older than 75 years of age. ? You are at high risk for heart disease.  What should I know about cancer screening? Many types  of cancers can be detected early and may often be prevented. Lung Cancer  You should be screened every year for lung cancer if: ? You are a current smoker who has smoked for at least 30 years. ? You are a former smoker who has quit within the past 15 years.  Talk to your health care provider about your  screening options, when you should start screening, and how often you should be screened.  Colorectal Cancer  Routine colorectal cancer screening usually begins at 75 years of age and should be repeated every 5-10 years until you are 75 years old. You may need to be screened more often if early forms of precancerous polyps or small growths are found. Your health care provider may recommend screening at an earlier age if you have risk factors for colon cancer.  Your health care provider may recommend using home test kits to check for hidden blood in the stool.  A small camera at the end of a tube can be used to examine your colon (sigmoidoscopy or colonoscopy). This checks for the earliest forms of colorectal cancer.  Prostate and Testicular Cancer  Depending on your age and overall health, your health care provider may do certain tests to screen for prostate and testicular cancer.  Talk to your health care provider about any symptoms or concerns you have about testicular or prostate cancer.  Skin Cancer  Check your skin from head to toe regularly.  Tell your health care provider about any new moles or changes in moles, especially if: ? There is a change in a mole's size, shape, or color. ? You have a mole that is larger than a pencil eraser.  Always use sunscreen. Apply sunscreen liberally and repeat throughout the day.  Protect yourself by wearing long sleeves, pants, a wide-brimmed hat, and sunglasses when outside.  What should I know about heart disease, diabetes, and high blood pressure?  If you are 60-52 years of age, have your blood pressure checked every 3-5 years. If you are 53 years of age or older, have your blood pressure checked every year. You should have your blood pressure measured twice-once when you are at a hospital or clinic, and once when you are not at a hospital or clinic. Record the average of the two measurements. To check your blood pressure when you are not at  a hospital or clinic, you can use: ? An automated blood pressure machine at a pharmacy. ? A home blood pressure monitor.  Talk to your health care provider about your target blood pressure.  If you are between 39-9 years old, ask your health care provider if you should take aspirin to prevent heart disease.  Have regular diabetes screenings by checking your fasting blood sugar level. ? If you are at a normal weight and have a low risk for diabetes, have this test once every three years after the age of 31. ? If you are overweight and have a high risk for diabetes, consider being tested at a younger age or more often.  A one-time screening for abdominal aortic aneurysm (AAA) by ultrasound is recommended for men aged 87-75 years who are current or former smokers. What should I know about preventing infection? Hepatitis B If you have a higher risk for hepatitis B, you should be screened for this virus. Talk with your health care provider to find out if you are at risk for hepatitis B infection. Hepatitis C Blood testing is recommended for:  Everyone born  from [place] through 1964.  Anyone with known risk factors for hepatitis C.  Sexually Transmitted Diseases (STDs)  You should be screened each year for STDs including gonorrhea and chlamydia if: ? You are sexually active and are younger than 75 years of age. ? You are older than 75 years of age and your health care provider tells you that you are at risk for this type of infection. ? Your sexual activity has changed since you were last screened and you are at an increased risk for chlamydia or gonorrhea. Ask your health care provider if you are at risk.  Talk with your health care provider about whether you are at high risk of being infected with HIV. Your health care provider may recommend a prescription medicine to help prevent HIV infection.  What else can I do?  Schedule regular health, dental, and eye exams.  Stay current with  your vaccines (immunizations).  Do not use any tobacco products, such as cigarettes, chewing tobacco, and e-cigarettes. If you need help quitting, ask your health care provider.  Limit alcohol intake to no more than 2 drinks per day. One drink equals 12 ounces of beer, 5 ounces of wine, or 1 ounces of hard liquor.  Do not use street drugs.  Do not share needles.  Ask your health care provider for help if you need support or information about quitting drugs.  Tell your health care provider if you often feel depressed.  Tell your health care provider if you have ever been abused or do not feel safe at home. This information is not intended to replace advice given to you by your health care provider. Make sure you discuss any questions you have with your health care provider. Document Released: 02/03/2008 Document Revised: 04/05/2016 Document Reviewed: 05/11/2015 Elsevier Interactive Patient Education  [employer].

## 2017-08-30 ENCOUNTER — Other Ambulatory Visit: Payer: Self-pay | Admitting: Cardiovascular Disease

## 2017-08-30 DIAGNOSIS — I5033 Acute on chronic diastolic (congestive) heart failure: Secondary | ICD-10-CM

## 2017-08-30 MED ORDER — FUROSEMIDE 40 MG PO TABS: 40.0000 mg | ORAL_TABLET | Freq: Two times a day (BID) | ORAL | 0 refills | Status: DC

## 2017-08-31 ENCOUNTER — Encounter: Payer: Self-pay | Admitting: Internal Medicine

## 2017-08-31 ENCOUNTER — Ambulatory Visit (INDEPENDENT_AMBULATORY_CARE_PROVIDER_SITE_OTHER): Payer: Medicare Other | Admitting: Internal Medicine

## 2017-08-31 VITALS — BP 138/70 | HR 73 | Temp 98.3°F | Resp 14 | Ht 73.0 in | Wt 340.1 lb

## 2017-08-31 DIAGNOSIS — R21 Rash and other nonspecific skin eruption: Secondary | ICD-10-CM | POA: Diagnosis not present

## 2017-08-31 MED ORDER — BETAMETHASONE DIPROPIONATE AUG 0.05 % EX CREA: TOPICAL_CREAM | Freq: Two times a day (BID) | CUTANEOUS | 0 refills | Status: DC

## 2017-08-31 NOTE — Progress Notes (Signed)
Pre visit review using our clinic review tool, if applicable. No additional management support is needed unless otherwise documented below in the visit note. 

## 2017-08-31 NOTE — Progress Notes (Signed)
Subjective:    Patient ID: David Parsons, male    DOB: 09-May-1941, 77 y.o.   MRN: [id number]  DOS:  08/31/2017 Type of visit - description : Acute Interval history: Several days history of rash on the left arm. Does not recall any blisters.  Rash is not painful   Review of Systems Not taking any new medications, is very sensitive to poison ivy but has  not been in the yard lately. Has a new wool sweater that he has used few times  Past Medical History:  Diagnosis Date  . Aortic insufficiency     02/16/09 Chest CT:  The aortic root has a diameter measuring 4.6 cm, image 64 of the coronal series.  The ascending    thoracic aorta has a diameter of 3.8 cm, image 65 of the coronal series.  At the level of the aortic arch    the thoracic aorta has a maximum diameter of 3.2 cm.  The descending thoracic aorta has a maximum           diameter of 3.2 cm.  There is no evidence for aortic disse        . Aortic root enlargement (HCC)    The aortic root has a diameter measuring 4.6 cm, image 64 of the coronal series.  The ascending  thoracic aorta has a diameter of 3.8 cm, image 65 of the coronal series.  At the level of the aortic arch   the thoracic aorta has a maximum diameter of 3.2 cm.  The descending thoracic aorta has a maximum   diameter of 3.2 cm.  There is no evidence for aortic dissection.       Marland Kitchen CAD (coronary artery disease)   . Cardiomyopathy     01/2009 - Cath - nonobs. dzs.  EF 35%.  . CHF (congestive heart failure) ([place])   . Diabetes mellitus   . Dyslipidemia 01/14/2013  . Gallstones    s/p cholecystectomy    . GERD (gastroesophageal reflux disease)   . Glaucoma 10/05/2016  . Heart murmur   . Helicobacter pylori gastritis    (Tx [place]yl[place] 4/10)  . Hypertension   . Hypokalemia   . Iron deficiency anemia   . Obesity, Class III, BMI 40-49.9 (morbid obesity) ([place])   . Onychomycosis 03/09/2015  . PONV (postoperative nausea and vomiting)   . S/P aortic valve replacement and aortoplasty  12/28/2011   Biological Bentall Aortic Root Replacement using 25 mm Edwards Magna Ease pericardial tissue valve and 28 mm Vascutek Gelweave Valsalva conduit with reimplantation of left main and right coronary artery and CABG x1 using SVG to RCA  . Sleep apnea    occasionally wears CPAP at night    Past Surgical History:  Procedure Laterality Date  . ADRENALECTOMY    . BENTALL PROCEDURE  12/28/2011   Procedure: BENTALL PROCEDURE;  Surgeon: Rexene Alberts, MD;  Location: [facility];  Service: Open Heart Surgery;  Laterality: N/A;  . CARDIAC CATHETERIZATION     2013  . CARDIAC VALVE REPLACEMENT  12/28/2011   Bentall with tissue valve  . CHOLECYSTECTOMY    . COLONOSCOPY  09/25/2011  . COLONOSCOPY W/ BIOPSIES AND POLYPECTOMY  11/04/2008   colon polyps, diverticulosis   . CORONARY ARTERY BYPASS GRAFT  12/28/2011   Procedure: CORONARY ARTERY BYPASS GRAFTING (CABG);  Surgeon: Rexene Alberts, MD;  Location: [place];  Service: Open Heart Surgery;  Laterality: N/A;  cabg x 1  . LEFT AND RIGHT HEART  CATHETERIZATION WITH CORONARY ANGIOGRAM N/A 11/22/2011   Procedure: LEFT AND RIGHT HEART CATHETERIZATION WITH CORONARY ANGIOGRAM;  Surgeon: Burnell Blanks, MD;  Location: [facility] CATH LAB;  Service: Cardiovascular;  Laterality: N/A;  . MULTIPLE TOOTH EXTRACTIONS    . TEE WITHOUT CARDIOVERSION  11/22/2011   Procedure: TRANSESOPHAGEAL ECHOCARDIOGRAM (TEE);  Surgeon: Larey Dresser, MD;  Location: [place];  Service: Cardiovascular;  Laterality: N/A;  . UPPER GASTROINTESTINAL ENDOSCOPY  11/04/2008   w/bx, H pylori gastritis  . UPPER GASTROINTESTINAL ENDOSCOPY  09/25/2011    Social History   Socioeconomic History  . Marital status: Married    Spouse name: [name]  . Number of children: 0  . Years of education: Not on file  . Highest education level: Not on file  Social Needs  . Financial resource strain: Not on file  . Food insecurity - worry: Not on file  . Food insecurity - inability: Not on file  .  Transportation needs - medical: Not on file  . Transportation needs - non-medical: Not on file  Occupational History    Employer: [employer]    Comment: Owner of [employer]  Tobacco Use  . Smoking status: Never Smoker  . Smokeless tobacco: Never Used  Substance and Sexual Activity  . Alcohol use: No  . Drug use: No  . Sexual activity: Not Currently    Comment: lives with wife, pastros 2 churches  Other Topics Concern  . Not on file  Social History Narrative   The patient is married, lives with his wife in  [place].  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  He  lives a very sedentary lifestyle.  He is a nonsmoker.  He denies alcohol consumption.       Allergies as of 08/31/2017      Reactions   Latex Hives   Latex gloves      Medication List        Accurate as of 08/31/17  5:07 PM. Always use your most recent med list.          amLODipine 10 MG tablet Commonly known as:  NORVASC TAKE 1 TABLET EVERY DAY   aspirin EC 81 MG tablet Take 81 mg by mouth daily.   augmented betamethasone dipropionate 0.05 % cream Commonly known as:  DIPROLENE-AF Apply topically 2 (two) times daily.   azelastine 0.1 % nasal spray Commonly known as:  ASTELIN Place 2 sprays into both nostrils 2 (two) times daily. Use in each nostril as directed   carvedilol 25 MG tablet Commonly known as:  COREG TAKE 1 TABLET TWICE A DAY WITH FOOD   clobetasol ointment 0.05 % Commonly known as:  TEMOVATE apply to affected area twice a day UPON ITCHING AND IRRITATION(NOT TO FACE OR GROIN)   ferrous sulfate 325 (65 FE) MG tablet Take 325 mg by mouth 2 (two) times daily before a meal.   fluticasone 50 MCG/ACT nasal spray Commonly known as:  FLONASE Place 2 sprays into both nostrils daily.   furosemide 40 MG tablet Commonly known as:  LASIX Take 1 tablet (40 mg total) by mouth 2 (two) times daily. Please keep upcoming appt before anymore refills. Thank you     HYDROcodone-homatropine 5-1.5 MG/5ML syrup Commonly known as:  HYCODAN Take 5 mLs by mouth every 8 (eight) hours as needed for cough.   hydrocortisone 2.5 % cream Apply 1 application topically 2 (two) times daily.   KLOR-CON M20 20 MEQ  tablet Generic drug:  potassium chloride SA TAKE 3 TABLETS IN THE MORNING, TAKE 2 TABLETS AT LUNCH AND TAKE 2 TABLETS IN THE EVENING.   levocetirizine 5 MG tablet Commonly known as:  XYZAL TAKE 1 TABLET EVERY EVENING   losartan 100 MG tablet Commonly known as:  COZAAR Take 1 tablet (100 mg total) daily by mouth.   multivitamin with minerals Tabs tablet Take 1 tablet by mouth daily.   omeprazole 40 MG capsule Commonly known as:  PRILOSEC TAKE ONE CAPSULE EVERY DAY   triamterene-hydrochlorothiazide 37.5-25 MG tablet Commonly known as:  MAXZIDE-25 TAKE 1 TABLET BY MOUTH EVERY DAY          Objective:   Physical Exam BP 138/70 (BP Location: Left Arm, Patient Position: Sitting, Cuff Size: Normal)   Pulse 73   Temp 98.3 F (36.8 C) (Oral)   Resp 14   Ht 6\' 1"  (1.854 m)   Wt (!) 340 lb 2 oz (154.3 kg)   SpO2 97%   BMI 44.87 kg/m   General:   Well developed, well nourished . NAD.  HEENT:  Normocephalic . Face symmetric, atraumatic  Skin: See picture, left arm, not TTP, no fluctuance, not warm. Neurologic:  alert & oriented X3.  Speech normal, gait appropriate for age and unassisted Psych--  Cognition and judgment appear intact.  Cooperative with normal attention span and concentration.  Behavior appropriate. No anxious or depressed appearing.          Assessment & Plan:    77 y/o male with multiple medical problems including CAD, CHF, aortic insufficiency, DM, high cholesterol, on aspirin, not on anticoagulants presents with:  Rash: Suspect eczema, contact dermatitis less likely.  Will treat with high power topical steroids for a week, call if not improving.

## 2017-08-31 NOTE — Patient Instructions (Signed)
Apply the cream twice a day for 1 week  Call if no better

## 2017-09-12 ENCOUNTER — Other Ambulatory Visit: Payer: Self-pay | Admitting: Family Medicine

## 2017-09-20 ENCOUNTER — Other Ambulatory Visit: Payer: Self-pay

## 2017-09-20 ENCOUNTER — Encounter (INDEPENDENT_AMBULATORY_CARE_PROVIDER_SITE_OTHER): Payer: Medicare Other

## 2017-09-20 MED ORDER — POTASSIUM CHLORIDE CRYS ER 20 MEQ PO TBCR: EXTENDED_RELEASE_TABLET | ORAL | 4 refills | Status: DC

## 2017-09-21 ENCOUNTER — Telehealth: Payer: Self-pay

## 2017-09-21 NOTE — Telephone Encounter (Signed)
Copied from [place] [phone]. Topic: Referral - Request >> Sep 21, 2017  1:31 PM Scherrie Gerlach wrote: Reason for CRM: wife states pt went to the "healthy weight and wellness seminar" he was referred to and thought this was a free service. Pt received a bill for this appt. Now hoping you can refer to somewhere else that will be a covered and free visit.

## 2017-09-27 DIAGNOSIS — H524 Presbyopia: Secondary | ICD-10-CM | POA: Diagnosis not present

## 2017-09-27 DIAGNOSIS — H25813 Combined forms of age-related cataract, bilateral: Secondary | ICD-10-CM | POA: Diagnosis not present

## 2017-09-27 DIAGNOSIS — H04123 Dry eye syndrome of bilateral lacrimal glands: Secondary | ICD-10-CM | POA: Diagnosis not present

## 2017-09-27 DIAGNOSIS — H401132 Primary open-angle glaucoma, bilateral, moderate stage: Secondary | ICD-10-CM | POA: Diagnosis not present

## 2017-09-27 DIAGNOSIS — H35371 Puckering of macula, right eye: Secondary | ICD-10-CM | POA: Diagnosis not present

## 2017-09-28 ENCOUNTER — Other Ambulatory Visit: Payer: Self-pay | Admitting: Cardiovascular Disease

## 2017-10-02 ENCOUNTER — Ambulatory Visit (INDEPENDENT_AMBULATORY_CARE_PROVIDER_SITE_OTHER): Payer: Self-pay | Admitting: Family Medicine

## 2017-10-02 DIAGNOSIS — L304 Erythema intertrigo: Secondary | ICD-10-CM | POA: Diagnosis not present

## 2017-10-02 DIAGNOSIS — L910 Hypertrophic scar: Secondary | ICD-10-CM | POA: Diagnosis not present

## 2017-10-02 DIAGNOSIS — L309 Dermatitis, unspecified: Secondary | ICD-10-CM | POA: Diagnosis not present

## 2017-10-17 ENCOUNTER — Other Ambulatory Visit: Payer: Self-pay

## 2017-10-17 MED ORDER — TRIAMTERENE-HCTZ 37.5-25 MG PO TABS: 1.0000 | ORAL_TABLET | Freq: Every day | ORAL | 0 refills | Status: DC

## 2017-10-22 ENCOUNTER — Ambulatory Visit: Payer: Medicare Other | Admitting: Cardiovascular Disease

## 2017-10-22 ENCOUNTER — Other Ambulatory Visit: Payer: Self-pay | Admitting: Family Medicine

## 2017-10-25 ENCOUNTER — Other Ambulatory Visit: Payer: Self-pay | Admitting: *Deleted

## 2017-10-25 MED ORDER — CARVEDILOL 25 MG PO TABS: 25.0000 mg | ORAL_TABLET | Freq: Two times a day (BID) | ORAL | 0 refills | Status: DC

## 2017-10-29 ENCOUNTER — Other Ambulatory Visit: Payer: Self-pay | Admitting: Cardiovascular Disease

## 2017-11-06 ENCOUNTER — Ambulatory Visit: Payer: Medicare Other | Admitting: Family Medicine

## 2017-11-06 ENCOUNTER — Ambulatory Visit: Payer: Medicare Other | Admitting: Podiatry

## 2017-11-07 ENCOUNTER — Encounter: Payer: Self-pay | Admitting: Podiatry

## 2017-11-07 ENCOUNTER — Ambulatory Visit (INDEPENDENT_AMBULATORY_CARE_PROVIDER_SITE_OTHER): Payer: Medicare Other | Admitting: Podiatry

## 2017-11-07 DIAGNOSIS — B351 Tinea unguium: Secondary | ICD-10-CM | POA: Diagnosis not present

## 2017-11-07 DIAGNOSIS — M79609 Pain in unspecified limb: Secondary | ICD-10-CM | POA: Diagnosis not present

## 2017-11-07 NOTE — Progress Notes (Signed)
Patient ID: David Parsons, male   DOB: Jan 20, 1941, 77 y.o.   MRN: [id number] Complaint:  Visit Type: Patient returns to my office for continued preventative foot care services. Complaint: Patient states" my nails have grown long and thick and become painful to walk and wear shoes" . He presents for preventative foot care services. No changes to ROS  Podiatric Exam: Vascular: dorsalis pedis and posterior tibial pulses are palpable bilateral. Capillary return is immediate. Temperature gradient is WNL. Skin turgor WNL  Sensorium: Normal Semmes Weinstein monofilament test. Normal tactile sensation bilaterally. Nail Exam: Pt has thick disfigured discolored nails with subungual debris noted bilateral entire nail hallux through fifth toenails Ulcer Exam: There is no evidence of ulcer or pre-ulcerative changes or infection. Orthopedic Exam: Muscle tone and strength are WNL. No limitations in general ROM. No crepitus or effusions noted. Foot type and digits show no abnormalities. Bony prominences are unremarkable. Skin: No Porokeratosis. No infection or ulcers  Diagnosis:  Tinea unguium, Pain in right toe, pain in left toes  Treatment & Plan Procedures and Treatment: Consent by patient was obtained for treatment procedures. The patient understood the discussion of treatment and procedures well. All questions were answered thoroughly reviewed. Debridement of mycotic and hypertrophic toenails, 1 through 5 bilateral and clearing of subungual debris. No ulceration, no infection noted.  Return Visit-Office Procedure: Patient instructed to return to the office for a follow up visit 3 months for continued evaluation and treatment.  Gardiner Barefoot DPM

## 2017-11-08 ENCOUNTER — Ambulatory Visit (INDEPENDENT_AMBULATORY_CARE_PROVIDER_SITE_OTHER): Payer: Medicare Other | Admitting: Family Medicine

## 2017-11-08 ENCOUNTER — Encounter: Payer: Self-pay | Admitting: Family Medicine

## 2017-11-08 DIAGNOSIS — R739 Hyperglycemia, unspecified: Secondary | ICD-10-CM

## 2017-11-08 DIAGNOSIS — I10 Essential (primary) hypertension: Secondary | ICD-10-CM | POA: Diagnosis not present

## 2017-11-08 DIAGNOSIS — E785 Hyperlipidemia, unspecified: Secondary | ICD-10-CM

## 2017-11-08 NOTE — Assessment & Plan Note (Signed)
Well controlled, no changes to meds. Encouraged heart healthy diet such as the DASH diet and exercise as tolerated.  °

## 2017-11-08 NOTE — Assessment & Plan Note (Signed)
Encouraged DASH diet, decrease po intake and increase exercise as tolerated. Needs 7-8 hours of sleep nightly. Avoid trans fats, eat small, frequent meals every 4-5 hours with lean proteins, complex carbs and healthy fats. Minimize simple carbs, 

## 2017-11-08 NOTE — Patient Instructions (Signed)
Shingrix is the new shingles shot, 2 shots over 2-6 months at the pharmacy Hypertension Hypertension is another name for high blood pressure. High blood pressure forces your heart to work harder to pump blood. This can cause problems over time. There are two numbers in a blood pressure reading. There is a top number (systolic) over a bottom number (diastolic). It is best to have a blood pressure below 120/80. Healthy choices can help lower your blood pressure. You may need medicine to help lower your blood pressure if:  Your blood pressure cannot be lowered with healthy choices.  Your blood pressure is higher than 130/80.  Follow these instructions at home: Eating and drinking  If directed, follow the DASH eating plan. This diet includes: ? Filling half of your plate at each meal with fruits and vegetables. ? Filling one quarter of your plate at each meal with whole grains. Whole grains include whole wheat pasta, brown rice, and whole grain bread. ? Eating or drinking low-fat dairy products, such as skim milk or low-fat yogurt. ? Filling one quarter of your plate at each meal with low-fat (lean) proteins. Low-fat proteins include fish, skinless chicken, eggs, beans, and tofu. ? Avoiding fatty meat, cured and processed meat, or chicken with skin. ? Avoiding premade or processed food.  Eat less than 1,500 mg of salt (sodium) a day.  Limit alcohol use to no more than 1 drink a day for nonpregnant women and 2 drinks a day for men. One drink equals 12 oz of beer, 5 oz of wine, or 1 oz of hard liquor. Lifestyle  Work with your doctor to stay at a healthy weight or to lose weight. Ask your doctor what the best weight is for you.  Get at least 30 minutes of exercise that causes your heart to beat faster (aerobic exercise) most days of the week. This may include walking, swimming, or biking.  Get at least 30 minutes of exercise that strengthens your muscles (resistance exercise) at least 3 days  a week. This may include lifting weights or pilates.  Do not use any products that contain nicotine or tobacco. This includes cigarettes and e-cigarettes. If you need help quitting, ask your doctor.  Check your blood pressure at home as told by your doctor.  Keep all follow-up visits as told by your doctor. This is important. Medicines  Take over-the-counter and prescription medicines only as told by your doctor. Follow directions carefully.  Do not skip doses of blood pressure medicine. The medicine does not work as well if you skip doses. Skipping doses also puts you at risk for problems.  Ask your doctor about side effects or reactions to medicines that you should watch for. Contact a doctor if:  You think you are having a reaction to the medicine you are taking.  You have headaches that keep coming back (recurring).  You feel dizzy.  You have swelling in your ankles.  You have trouble with your vision. Get help right away if:  You get a very bad headache.  You start to feel confused.  You feel weak or numb.  You feel faint.  You get very bad pain in your: ? Chest. ? Belly (abdomen).  You throw up (vomit) more than once.  You have trouble breathing. Summary  Hypertension is another name for high blood pressure.  Making healthy choices can help lower blood pressure. If your blood pressure cannot be controlled with healthy choices, you may need to take medicine. This   information is not intended to replace advice given to you by your health care provider. Make sure you discuss any questions you have with your health care provider. Document Released: 01/24/2008 Document Revised: 07/05/2016 Document Reviewed: 07/05/2016 Elsevier Interactive Patient Education  2018 Elsevier Inc.  

## 2017-11-08 NOTE — Progress Notes (Signed)
Subjective:  I acted as a Education administrator for [employer]. Yancey Flemings, [place]   Patient ID: David Parsons, male    DOB: 31-Aug-1940, 77 y.o.   MRN: [id number]  Chief Complaint  Patient presents with  . Hypertension    HPI  Patient is in today for 6 month follow up on essential hypertension. He reports he is feeling well. No recent febrile illness or hospitalizations. No polydipsia, polyuria or acute concerns. He has been trying to eat better and has lost several pounds. Denies CP/palp/SOB/HA/congestion/fevers/GI or GU c/o. Taking meds as prescribed  Patient Care Team: [name], MD as PCP - General (Family Medicine) Burnell Blanks, MD as Consulting Physician (Cardiology) Gatha Mayer, MD as Consulting Physician (Gastroenterology) Druscilla Brownie, MD as Consulting Physician (Dermatology) Calvert Cantor, MD as Consulting Physician (Ophthalmology) Carolan Clines, MD as Consulting Physician (Urology) Gardiner Barefoot, DPM as Consulting Physician (Podiatry)   Past Medical History:  Diagnosis Date  . Aortic insufficiency     02/16/09 Chest CT:  The aortic root has a diameter measuring 4.6 cm, image 64 of the coronal series.  The ascending    thoracic aorta has a diameter of 3.8 cm, image 65 of the coronal series.  At the level of the aortic arch    the thoracic aorta has a maximum diameter of 3.2 cm.  The descending thoracic aorta has a maximum           diameter of 3.2 cm.  There is no evidence for aortic disse        . Aortic root enlargement (H[facility])    The aortic root has a diameter measuring 4.6 cm, image 64 of the coronal series.  The ascending  thoracic aorta has a diameter of 3.8 cm, image 65 of the coronal series.  At the level of the aortic arch   the thoracic aorta has a maximum diameter of 3.2 cm.  The descending thoracic aorta has a maximum   diameter of 3.2 cm.  There is no evidence for aortic dissection.       Marland Kitchen CAD (coronary artery disease)   . Cardiomyopathy     01/2009 -  Cath - nonobs. dzs.  EF 35%.  . CHF (congestive heart failure) ([place])   . Diabetes mellitus   . Dyslipidemia 01/14/2013  . Gallstones    s/p cholecystectomy    . GERD (gastroesophageal reflux disease)   . Glaucoma 10/05/2016  . Heart murmur   . Helicobacter pylori gastritis    (Tx Pyl[place] 4/10)  . Hypertension   . Hypokalemia   . Iron deficiency anemia   . Obesity, Class III, BMI 40-49.9 (morbid obesity) ([place])   . Onychomycosis 03/09/2015  . PONV (postoperative nausea and vomiting)   . 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 Sleep apnea    occasionally wears CPAP at night    Past Surgical History:  Procedure Laterality Date  . ADRENALECTOMY    . BENTALL PROCEDURE  12/28/2011   Procedure: BENTALL PROCEDURE;  Surgeon: Rexene Alberts, MD;  Location: [facility];  Service: Open Heart Surgery;  Laterality: N/A;  . CARDIAC CATHETERIZATION     2013  . CARDIAC VALVE REPLACEMENT  12/28/2011   Bentall with tissue valve  . CHOLECYSTECTOMY    .  COLONOSCOPY  09/25/2011  . COLONOSCOPY W/ BIOPSIES AND POLYPECTOMY  11/04/2008   colon polyps, diverticulosis   . CORONARY ARTERY BYPASS GRAFT  12/28/2011   Procedure: CORONARY ARTERY BYPASS GRAFTING (CABG);  Surgeon: Rexene Alberts, MD;  Location: [facility];  Service: Open Heart Surgery;  Laterality: N/A;  cabg x 1  . LEFT AND RIGHT HEART CATHETERIZATION WITH CORONARY ANGIOGRAM N/A 11/22/2011   Procedure: LEFT AND RIGHT HEART CATHETERIZATION WITH CORONARY ANGIOGRAM;  Surgeon: Burnell Blanks, MD;  Location: [facility] CATH LAB;  Service: Cardiovascular;  Laterality: N/A;  . MULTIPLE TOOTH EXTRACTIONS    . TEE WITHOUT CARDIOVERSION  11/22/2011   Procedure: TRANSESOPHAGEAL ECHOCARDIOGRAM (TEE);  Surgeon: Larey Dresser, MD;   Location: [facility];  Service: Cardiovascular;  Laterality: N/A;  . UPPER GASTROINTESTINAL ENDOSCOPY  11/04/2008   w/bx, H pylori gastritis  . UPPER GASTROINTESTINAL ENDOSCOPY  09/25/2011    Family History  Problem Relation Age of Onset  . Breast cancer Sister   . Diabetes Sister   . Colon cancer Brother 70       at least 24  . Diabetes Brother   . Cancer Brother        colon, prostate  . Prostate cancer Brother   . Diabetes Brother   . Diabetes Sister   . Cancer Sister        breast, colon, melanoma  . Heart disease Sister   . Hypertension Mother   . Arthritis Mother   . Kidney failure Sister   . Colon cancer Sister   . Breast cancer Sister   . Kidney disease Sister        s/p transplant  . Hypertension Sister   . Diabetes Sister   . Stroke Father   . Hypertension Father     Social History   Socioeconomic History  . Marital status: Married    Spouse name: David Parsons  . Number of children: 0  . Years of education: Not on file  . 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 Financial resource strain: Not on file  . Food insecurity:    Worry: Not on file    Inability: Not on file  . Transportation needs:    Medical: Not on file    Non-medical: Not on file  Tobacco Use  . Smoking status: Never Smoker  . Smokeless tobacco: Never Used  Substance and Sexual Activity  . Alcohol use: No  . Drug use: No  . Sexual activity: Not Currently    Comment: lives with wife, pastros 2 churches  Lifestyle  . Physical activity:    Days per week: Not on file    Minutes per session: Not on file  . Stress: Not on file  Relationships  . Social connections:    Talks on phone: Not on file    Gets together: Not on file    Attends religious service: Not on file    Active member of club or organization: Not on file    Attends meetings of clubs or organizations: Not on file     Relationship status: Not on file  . Intimate partner violence:    Fear of current or ex partner: Not on file    Emotionally abused: Not on file    Physically abused: Not on file    Forced sexual activity: Not on file  Other Topics Concern  . Not on file  Social History Narrative   The patient is married, lives with his wife in  [place].  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  He  lives a very sedentary lifestyle.  He is a nonsmoker.  He denies alcohol consumption.     Outpatient Medications Prior to Visit  Medication Sig Dispense Refill  . amLODipine (NORVASC) 10 MG tablet TAKE 1 TABLET EVERY DAY 90 tablet 0  . aspirin EC 81 MG tablet Take 81 mg by mouth daily.    Marland Kitchen augmented betamethasone dipropionate (DIPROLENE-AF) 0.05 % cream Apply topically 2 (two) times daily. 15 g 0  . azelastine (ASTELIN) 0.1 % nasal spray Place 2 sprays into both nostrils 2 (two) times daily. Use in each nostril as directed 30 mL 2  . carvedilol (COREG) 25 MG tablet Take 1 tablet (25 mg total) by mouth 2 (two) times daily with a meal. 180 tablet 0  . clobetasol ointment (TEMOVATE) 0.05 % apply to affected area twice a day UPON ITCHING AND IRRITATION(NOT TO FACE OR GROIN)  0  . ferrous sulfate 325 (65 FE) MG tablet Take 325 mg by mouth 2 (two) times daily before a meal.    . fluticasone (FLONASE) 50 MCG/ACT nasal spray Place 2 sprays into both nostrils daily. 16 g 1  . furosemide (LASIX) 40 MG tablet Take 1 tablet (40 mg total) by mouth 2 (two) times daily. Please keep upcoming appt before anymore refills. Thank you 180 tablet 0  . HYDROcodone-homatropine (HYCODAN) 5-1.5 MG/5ML syrup Take 5 mLs by mouth every 8 (eight) hours as needed for cough. 120 mL 0  . hydrocortisone 2.5 % cream Apply 1 application topically 2 (two) times daily.   0  . levocetirizine (XYZAL) 5 MG tablet TAKE 1 TABLET EVERY EVENING 30 tablet 0  . losartan (COZAAR) 100 MG tablet Take 1 tablet (100 mg total) by mouth daily.  Please keep upcoming appt for future refills. Thank you 30 tablet 1  . Multiple Vitamin (MULITIVITAMIN WITH MINERALS) TABS Take 1 tablet by mouth daily.    Marland Kitchen omeprazole (PRILOSEC) 40 MG capsule TAKE ONE CAPSULE EVERY DAY 30 capsule 6  . potassium chloride SA (KLOR-CON M20) 20 MEQ tablet TAKE 3 TABLETS IN THE MORNING, TAKE 2 TABLETS AT LUNCH AND TAKE 2 TABLETS IN THE EVENING. 196 tablet 4  . triamterene-hydrochlorothiazide (MAXZIDE-25) 37.5-25 MG tablet Take 1 tablet by mouth daily. 90 tablet 0   No facility-administered medications prior to visit.     Allergies  Allergen Reactions  . Latex Hives    Latex gloves     Review of Systems  Constitutional: Negative for fever and malaise/fatigue.  HENT: Negative for congestion.   Eyes: Negative for blurred vision.  Respiratory: Negative for shortness of breath.   Cardiovascular: Negative for chest pain, palpitations and leg swelling.  Gastrointestinal: Negative for abdominal pain, blood in stool and nausea.  Genitourinary: Negative for dysuria and frequency.  Musculoskeletal: Negative for falls.  Skin: Negative for rash.  Neurological: Negative for dizziness, loss of consciousness and headaches.  Endo/Heme/Allergies: Negative for environmental allergies.  Psychiatric/Behavioral: Negative for depression. The patient is not nervous/anxious.        Objective:    Physical Exam  Constitutional: He is oriented to person, place, and time. He appears well-developed and well-nourished. No distress.  HENT:  Head: Normocephalic and atraumatic.  Nose: Nose normal.  Eyes: Right eye exhibits no discharge. Left eye exhibits  no discharge.  Neck: Normal range of motion. Neck supple.  Cardiovascular: Normal rate and regular rhythm.  No murmur heard. Pulmonary/Chest: Effort normal and breath sounds normal.  Abdominal: Soft. Bowel sounds are normal. There is no tenderness.  Musculoskeletal: He exhibits no edema.  Neurological: He is alert and  oriented to person, place, and time.  Skin: Skin is warm and dry.  Psychiatric: He has a normal mood and affect.  Nursing note and vitals reviewed.   Temp 98.1 F (36.7 C) (Oral)   Resp 16   Ht 6' 0.84" (1.85 m)   Wt (!) 337 lb 9.6 oz (153.1 kg)   SpO2 97%   BMI 44.74 kg/m  Wt Readings from Last 3 Encounters:  11/08/17 (!) 337 lb 9.6 oz (153.1 kg)  08/31/17 (!) 340 lb 2 oz (154.3 kg)  08/09/17 (!) 341 lb (154.7 kg)   BP Readings from Last 3 Encounters:  08/31/17 138/70  08/09/17 138/80  05/07/17 (!) 142/74     Immunization History  Administered Date(s) Administered  . Influenza Split 05/15/2011, 05/22/2012  . Influenza Whole 06/07/2009, 05/11/2010  . Influenza, High Dose Seasonal PF 04/22/2015, 05/07/2017  . Influenza,inj,Quad PF,6+ Mos 05/09/2013, 04/20/2014  . Influenza-Unspecified 05/17/2016  . Pneumococcal Conjugate-13 05/09/2013  . Pneumococcal Polysaccharide-23 04/16/2006, 04/22/2015  . Td 04/14/2008    Health Maintenance  Topic Date Due  . TETANUS/TDAP  04/14/2018  . COLONOSCOPY  09/24/2021  . INFLUENZA VACCINE  Completed  . PNA vac Low Risk Adult  Completed    Lab Results  Component Value Date   WBC 7.9 05/07/2017   HGB 14.8 05/07/2017   HCT 45.0 05/07/2017   PLT 265.0 05/07/2017   GLUCOSE 121 (H) 05/07/2017   CHOL 172 05/07/2017   TRIG 121.0 05/07/2017   HDL 38.50 (L) 05/07/2017   LDLCALC 110 (H) 05/07/2017   ALT 13 05/07/2017   AST 15 05/07/2017   NA 139 05/07/2017   K 3.4 (L) 08/07/2017   CL 99 05/07/2017   CREATININE 1.15 05/07/2017   BUN 16 05/07/2017   CO2 29 05/07/2017   TSH 1.23 05/07/2017   PSA 7.32 (H) 09/07/2010   INR 1.21 01/02/2012   HGBA1C 5.5 05/07/2017    Lab Results  Component Value Date   TSH 1.23 05/07/2017   Lab Results  Component Value Date   WBC 7.9 05/07/2017   HGB 14.8 05/07/2017   HCT 45.0 05/07/2017   MCV 89.5 05/07/2017   PLT 265.0 05/07/2017   Lab Results  Component Value Date   NA 139 05/07/2017    K 3.4 (L) 08/07/2017   CO2 29 05/07/2017   GLUCOSE 121 (H) 05/07/2017   BUN 16 05/07/2017   CREATININE 1.15 05/07/2017   BILITOT 1.4 (H) 05/07/2017   ALKPHOS 74 05/07/2017   AST 15 05/07/2017   ALT 13 05/07/2017   PROT 7.6 05/07/2017   ALBUMIN 4.0 05/07/2017   CALCIUM 9.6 05/07/2017   GFR 79.68 05/07/2017   Lab Results  Component Value Date   CHOL 172 05/07/2017   Lab Results  Component Value Date   HDL 38.50 (L) 05/07/2017   Lab Results  Component Value Date   LDLCALC 110 (H) 05/07/2017   Lab Results  Component Value Date   TRIG 121.0 05/07/2017   Lab Results  Component Value Date   CHOLHDL 4 05/07/2017   Lab Results  Component Value Date   HGBA1C 5.5 05/07/2017         Assessment & Plan:  Problem List Items Addressed This Visit    Obesity, Class III, BMI 40-49.9 (morbid obesity) ([place])    Encouraged DASH diet, decrease po intake and increase exercise as tolerated. Needs 7-8 hours of sleep nightly. Avoid trans fats, eat small, frequent meals every 4-5 hours with lean proteins, complex carbs and healthy fats. Minimize simple carbs,       Essential hypertension    Well controlled, no changes to meds. Encouraged heart healthy diet such as the DASH diet and exercise as tolerated.       Relevant Orders   CBC   Comprehensive metabolic panel   TSH   Hyperglycemia    hgba1c acceptable, minimize simple carbs. Increase exercise as tolerated. Continue current meds      Relevant Orders   Hemoglobin A1c   Dyslipidemia    Encouraged heart healthy diet, increase exercise, avoid trans fats, consider a krill oil cap daily      Relevant Orders   Lipid panel      I am having David Parsons. David Parsons maintain his ferrous sulfate, multivitamin with minerals, aspirin EC, clobetasol ointment, hydrocortisone, fluticasone, azelastine, HYDROcodone-homatropine, levocetirizine, omeprazole, furosemide, augmented betamethasone dipropionate, amLODipine, potassium chloride SA,  triamterene-hydrochlorothiazide, carvedilol, and losartan.  No orders of the defined types were placed in this encounter.   CMA served as Education administrator during this visit. History, Physical and Plan performed by medical provider. Documentation and orders reviewed and attested to.  [name], MD

## 2017-11-08 NOTE — Assessment & Plan Note (Signed)
Encouraged heart healthy diet, increase exercise, avoid trans fats, consider a krill oil cap daily 

## 2017-11-08 NOTE — Assessment & Plan Note (Signed)
hgba1c acceptable, minimize simple carbs. Increase exercise as tolerated. Continue current meds 

## 2017-11-09 LAB — COMPREHENSIVE METABOLIC PANEL
ALK PHOS: 75 U/L (ref 39–117)
ALT: 13 U/L (ref 0–53)
AST: 16 U/L (ref 0–37)
Albumin: 4 g/dL (ref 3.5–5.2)
BUN: 14 mg/dL (ref 6–23)
CO2: 27 mEq/L (ref 19–32)
Calcium: 9.5 mg/dL (ref 8.4–10.5)
Chloride: 100 mEq/L (ref 96–112)
Creatinine, Ser: 1.05 mg/dL (ref 0.40–1.50)
GFR: 88.38 mL/min (ref >60.00)
GLUCOSE: 128 mg/dL — AB (ref 70–99)
POTASSIUM: 3 meq/L — AB (ref 3.5–5.1)
SODIUM: 140 meq/L (ref 135–145)
TOTAL PROTEIN: 7.4 g/dL (ref 6.0–8.3)
Total Bilirubin: 0.9 mg/dL (ref 0.2–1.2)

## 2017-11-09 LAB — CBC
HEMATOCRIT: 44.4 % (ref 39.0–52.0)
Hemoglobin: 14.8 g/dL (ref 13.0–17.0)
MCHC: 33.4 g/dL (ref 30.0–36.0)
MCV: 90.3 fl (ref 78.0–100.0)
Platelets: 263 10*3/uL (ref 150.0–400.0)
RBC: 4.91 Mil/uL (ref 4.22–5.81)
RDW: 13.7 % (ref 11.5–15.5)
WBC: 8.5 10*3/uL (ref 4.0–10.5)

## 2017-11-09 LAB — LIPID PANEL
Cholesterol: 157 mg/dL (ref 0–200)
HDL: 30.5 mg/dL — ABNORMAL LOW (ref >39.00)
LDL CALC: 92 mg/dL (ref 0–99)
NONHDL: 126.43
Total CHOL/HDL Ratio: 5
Triglycerides: 173 mg/dL — ABNORMAL HIGH (ref 0.0–149.0)
VLDL: 34.6 mg/dL (ref 0.0–40.0)

## 2017-11-09 LAB — HEMOGLOBIN A1C: Hgb A1c MFr Bld: 5.7 % (ref 4.6–6.5)

## 2017-11-09 LAB — TSH: TSH: 1.3 u[IU]/mL (ref 0.35–4.50)

## 2017-11-18 ENCOUNTER — Other Ambulatory Visit: Payer: Self-pay | Admitting: Family Medicine

## 2017-11-20 ENCOUNTER — Other Ambulatory Visit: Payer: Self-pay

## 2017-11-20 MED ORDER — POTASSIUM CHLORIDE CRYS ER 20 MEQ PO TBCR: EXTENDED_RELEASE_TABLET | ORAL | 4 refills | Status: DC

## 2017-11-24 ENCOUNTER — Other Ambulatory Visit: Payer: Self-pay | Admitting: Cardiovascular Disease

## 2017-11-26 ENCOUNTER — Telehealth: Payer: Self-pay | Admitting: Family Medicine

## 2017-11-26 ENCOUNTER — Other Ambulatory Visit: Payer: Self-pay | Admitting: Cardiovascular Disease

## 2017-11-26 MED ORDER — LOSARTAN POTASSIUM 100 MG PO TABS: 100.0000 mg | ORAL_TABLET | Freq: Every day | ORAL | 1 refills | Status: DC

## 2017-11-26 NOTE — Telephone Encounter (Signed)
Copied from [place] [id number]. Topic: Quick Communication - Rx Refill/Question >> Nov 26, 2017  2:31 PM Neva Seat wrote: losartan (COZAAR) 100 MG tablet  Pt needing refills.  Rx was denied...    [id number] - [address] [place], [address] [place] [address] Phone: [phone] Fax: [phone]

## 2017-11-26 NOTE — Telephone Encounter (Signed)
New Message    *STAT* If patient is at the pharmacy, call can be transferred to refill team.   1. Which medications need to be refilled? (please list name of each medication and dose if known) losartan (COZAAR) 100 MG tablet   2. Which pharmacy/location (including street and city if local pharmacy) is medication to be sent to? [facility]  3. Do they need a 30 day or 90 day supply? [place]

## 2017-11-26 NOTE — Telephone Encounter (Signed)
Losartan noted to be previously filled by cardiologist, Dr. Angelena Form. Left detailed message on voicemail for the pt to contact cardiologist for refill.

## 2017-11-26 NOTE — Telephone Encounter (Signed)
Pt's medication was sent to pt's pharmacy as requested. Confirmation received.  °

## 2017-11-30 ENCOUNTER — Other Ambulatory Visit: Payer: Self-pay | Admitting: Cardiovascular Disease

## 2017-11-30 DIAGNOSIS — I5033 Acute on chronic diastolic (congestive) heart failure: Secondary | ICD-10-CM

## 2017-12-04 ENCOUNTER — Other Ambulatory Visit: Payer: Self-pay | Admitting: Family Medicine

## 2017-12-12 ENCOUNTER — Other Ambulatory Visit: Payer: Self-pay | Admitting: Family Medicine

## 2017-12-20 ENCOUNTER — Other Ambulatory Visit: Payer: Self-pay | Admitting: Cardiovascular Disease

## 2017-12-30 ENCOUNTER — Other Ambulatory Visit: Payer: Self-pay | Admitting: Cardiovascular Disease

## 2017-12-30 DIAGNOSIS — I5033 Acute on chronic diastolic (congestive) heart failure: Secondary | ICD-10-CM

## 2017-12-31 ENCOUNTER — Other Ambulatory Visit: Payer: Self-pay | Admitting: Cardiovascular Disease

## 2018-01-02 ENCOUNTER — Ambulatory Visit: Payer: Medicare Other | Admitting: Cardiovascular Disease

## 2018-01-13 ENCOUNTER — Other Ambulatory Visit: Payer: Self-pay | Admitting: Cardiovascular Disease

## 2018-01-13 DIAGNOSIS — I5033 Acute on chronic diastolic (congestive) heart failure: Secondary | ICD-10-CM

## 2018-01-29 ENCOUNTER — Ambulatory Visit (INDEPENDENT_AMBULATORY_CARE_PROVIDER_SITE_OTHER): Payer: Medicare Other | Admitting: Cardiovascular Disease

## 2018-01-29 ENCOUNTER — Encounter: Payer: Self-pay | Admitting: Cardiovascular Disease

## 2018-01-29 VITALS — BP 148/80 | HR 69 | Ht 72.84 in | Wt 343.0 lb

## 2018-01-29 DIAGNOSIS — I10 Essential (primary) hypertension: Secondary | ICD-10-CM

## 2018-01-29 DIAGNOSIS — I359 Nonrheumatic aortic valve disorder, unspecified: Secondary | ICD-10-CM

## 2018-01-29 DIAGNOSIS — I5032 Chronic diastolic (congestive) heart failure: Secondary | ICD-10-CM

## 2018-01-29 DIAGNOSIS — I2510 Atherosclerotic heart disease of native coronary artery without angina pectoris: Secondary | ICD-10-CM | POA: Diagnosis not present

## 2018-01-29 NOTE — Patient Instructions (Signed)
Medication Instructions:  Your physician recommends that you continue on your current medications as directed. Please refer to the Current Medication list given to you today.  Labwork: None  Testing/Procedures: None  Follow-Up: Your physician wants you to follow-up in: 1 year with Dr. McAlhany.  You will receive a reminder letter in the mail two months in advance. If you don't receive a letter, please call our office to schedule the follow-up appointment.   Any Other Special Instructions Will Be Listed Below (If Applicable).     If you need a refill on your cardiac medications before your next appointment, please call your pharmacy.   

## 2018-01-29 NOTE — Progress Notes (Signed)
Chief Complaint  Patient presents with  . 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 He was referred in May 2010 for SOB, abdominal swelling and LE edema. He was diagnosed with a non-ischemic CM, moderate AI, non-obstructive CAD and a dilated aortic root. His aortic root continued to enlarge. I saw him 11/13/11 and arranged a cardiac cath on 11/22/11 which showed enlarged root, no obstructive disease in the left system. I could not engage the RCA secondary to the abnormal takeoff with the enlarged aortic root. CTA was performed and showed dilated aortic root with patent RCA. He underwent aortic root replacement, aortic valve replacement and SVG to RCA on 12/28/11.  Echo April 2016 with normal LV function, mild gradient across the valve.   He is here today for follow up. The patient denies any chest pain, dyspnea, palpitations, lower extremity edema, orthopnea, PND, dizziness, near syncope or syncope.   Primary Care Physician: [name], MD  Past Medical History:  Diagnosis Date  . Aortic insufficiency     02/16/09 Chest CT:  The aortic root has a diameter measuring 4.6 cm, image 64 of the coronal series.  The ascending    thoracic aorta has a diameter of 3.8 cm, image 65 of the coronal series.  At the level of the aortic arch    the thoracic aorta has a maximum diameter of 3.2 cm.  The descending thoracic aorta has a maximum           diameter of 3.2 cm.  There is no evidence for aortic disse        . Aortic root enlargement (HCC)    The aortic root has a diameter measuring 4.6 cm, image 64 of the coronal series.  The ascending  thoracic aorta has a diameter of 3.8 cm, image 65 of the coronal series.  At the  level of the aortic arch   the thoracic aorta has a maximum diameter of 3.2 cm.  The descending thoracic aorta has a maximum   diameter of 3.2 cm.  There is no evidence for aortic dissection.       Marland Kitchen CAD (coronary artery disease)   . Cardiomyopathy     01/2009 - Cath - nonobs. dzs.  EF 35%.  . CHF (congestive heart failure) ([place])   . Diabetes mellitus   . Dyslipidemia 01/14/2013  . Gallstones    s/p cholecystectomy    . GERD (gastroesophageal reflux disease)   . Glaucoma 10/05/2016  . Heart murmur   . Helicobacter pylori gastritis    (Tx [place] 4/10)  . Hypertension   . Hypokalemia   . Iron deficiency anemia   . Obesity, Class III, BMI 40-49.9 (morbid obesity) ([place])   . Onychomycosis 03/09/2015  . PONV (postoperative nausea and vomiting)   . 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 Sleep apnea    occasionally wears CPAP at night    Past Surgical History:  Procedure  Laterality Date  . ADRENALECTOMY    . BENTALL PROCEDURE  12/28/2011   Procedure: BENTALL PROCEDURE;  Surgeon: Rexene Alberts, MD;  Location: [facility];  Service: Open Heart Surgery;  Laterality: N/A;  . CARDIAC CATHETERIZATION     2013  . CARDIAC VALVE REPLACEMENT  12/28/2011   Bentall with tissue valve  . CHOLECYSTECTOMY    . COLONOSCOPY  09/25/2011  . COLONOSCOPY W/ BIOPSIES AND POLYPECTOMY  11/04/2008   colon polyps, diverticulosis   . CORONARY ARTERY BYPASS GRAFT  12/28/2011   Procedure: CORONARY ARTERY BYPASS GRAFTING (CABG);  Surgeon: Rexene Alberts, MD;  Location: [facility];  Service: Open Heart Surgery;  Laterality: N/A;  cabg x 1  . LEFT AND RIGHT HEART CATHETERIZATION WITH CORONARY ANGIOGRAM N/A 11/22/2011   Procedure: LEFT AND RIGHT HEART CATHETERIZATION WITH CORONARY  ANGIOGRAM;  Surgeon: Burnell Blanks, MD;  Location: [facility] CATH LAB;  Service: Cardiovascular;  Laterality: N/A;  . MULTIPLE TOOTH EXTRACTIONS    . TEE WITHOUT CARDIOVERSION  11/22/2011   Procedure: TRANSESOPHAGEAL ECHOCARDIOGRAM (TEE);  Surgeon: Larey Dresser, MD;  Location: [facility];  Service: Cardiovascular;  Laterality: N/A;  . UPPER GASTROINTESTINAL ENDOSCOPY  11/04/2008   w/bx, H pylori gastritis  . UPPER GASTROINTESTINAL ENDOSCOPY  09/25/2011    Current Outpatient Medications  Medication Sig Dispense Refill  . amLODipine (NORVASC) 10 MG tablet TAKE 1 TABLET BY MOUTH EVERY DAY 90 tablet 0  . aspirin EC 81 MG tablet Take 81 mg by mouth daily.    Marland Kitchen augmented betamethasone dipropionate (DIPROLENE-AF) 0.05 % cream Apply topically 2 (two) times daily. 15 g 0  . azelastine (ASTELIN) 0.1 % nasal spray Place 2 sprays into both nostrils 2 (two) times daily. Use in each nostril as directed 30 mL 2  . carvedilol (COREG) 25 MG tablet Take 1 tablet (25 mg total) by mouth 2 (two) times daily with a meal. 180 tablet 0  . clobetasol ointment (TEMOVATE) 0.05 % apply to affected area twice a day UPON ITCHING AND IRRITATION(NOT TO FACE OR GROIN)  0  . ferrous sulfate 325 (65 FE) MG tablet Take 325 mg by mouth 2 (two) times daily before a meal.    . fluticasone (FLONASE) 50 MCG/ACT nasal spray Place 2 sprays into both nostrils daily. 16 g 1  . furosemide (LASIX) 40 MG tablet TAKE 1 TABLET BY MOUTH TWICE A DAY 30 tablet 0  . HYDROcodone-homatropine (HYCODAN) 5-1.5 MG/5ML syrup Take 5 mLs by mouth every 8 (eight) hours as needed for cough. 120 mL 0  . hydrocortisone 2.5 % cream Apply 1 application topically 2 (two) times daily.   0  . levocetirizine (XYZAL) 5 MG tablet TAKE 1 TABLET EVERY EVENING 30 tablet 0  . losartan (COZAAR) 100 MG tablet TAKE 1 TABLET (100 MG TOTAL) BY MOUTH DAILY. PLEASE KEEP UPCOMING APPT FOR FUTURE REFILLS. THANK YOU 30 tablet 1  . Multiple Vitamin (MULITIVITAMIN WITH MINERALS)  TABS Take 1 tablet by mouth daily.    Marland Kitchen omeprazole (PRILOSEC) 40 MG capsule TAKE ONE CAPSULE EVERY DAY 30 capsule 6  . potassium chloride SA (KLOR-CON M20) 20 MEQ tablet TAKE 3 TABLETS IN THE MORNING, TAKE 2 TABLETS AT LUNCH AND TAKE 2 TABLETS IN THE EVENING. 196 tablet 3  . potassium chloride SA (KLOR-CON M20) 20 MEQ tablet TAKE 3 TABLETS IN THE MORNING, TAKE 2 TABLETS AT LUNCH AND TAKE 2 TABLETS IN THE EVENING. 196 tablet 4  . triamterene-hydrochlorothiazide (MAXZIDE-25) 37.5-25 MG tablet Take 1  tablet by mouth daily. 90 tablet 0   No current facility-administered medications for this visit.     Allergies  Allergen Reactions  . Latex Hives    Latex gloves     Social History   Socioeconomic History  . Marital status: Married    Spouse name: [name]  . Number of children: 0  . Years of education: Not on file  . 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 Financial resource strain: Not on file  . Food insecurity:    Worry: Not on file    Inability: Not on file  . Transportation needs:    Medical: Not on file    Non-medical: Not on file  Tobacco Use  . Smoking status: Never Smoker  . Smokeless tobacco: Never Used  Substance and Sexual Activity  . Alcohol use: No  . Drug use: No  . Sexual activity: Not Currently    Comment: lives with wife, pastros 2 churches  Lifestyle  . Physical activity:    Days per week: Not on file    Minutes per session: Not on file  . Stress: Not on file  Relationships  . Social connections:    Talks on phone: Not on file    Gets together: Not on file    Attends religious service: Not on file    Active member of club or organization: Not on file    Attends meetings of clubs or organizations: Not on file    Relationship status: Not on file  . Intimate partner violence:    Fear of current or ex partner: Not on file    Emotionally  abused: Not on file    Physically abused: Not on file    Forced sexual activity: Not on file  Other Topics Concern  . Not on file  Social History Narrative   The patient is married, lives with his wife in  [place].  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  He  lives a very sedentary lifestyle.  He is a nonsmoker.  He denies alcohol consumption.     Family History  Problem Relation Age of Onset  . Breast cancer Sister   . Diabetes Sister   . Colon cancer Brother 70       at least 48  . Diabetes Brother   . Cancer Brother        colon, prostate  . Prostate cancer Brother   . Diabetes Brother   . Diabetes Sister   . Cancer Sister        breast, colon, melanoma  . Heart disease Sister   . Hypertension Mother   . Arthritis Mother   . Kidney failure Sister   . Colon cancer Sister   . Breast cancer Sister   . Kidney disease Sister        s/p transplant  . Hypertension Sister   . Diabetes Sister   . Stroke Father   . Hypertension Father     Review of Systems:  As stated in the HPI and otherwise negative.   BP (!) 148/80   Pulse 69   Ht 6' 0.84" (1.85 m)   Wt (!) 343 lb (155.6 kg)   SpO2 98%   BMI 45.45 kg/m   Physical Examination:  General: Well developed, well nourished, NAD  HEENT: OP clear, mucus  membranes moist  SKIN: warm, dry. No rashes. Neuro: No focal deficits  Musculoskeletal: Muscle strength 5/5 all ext  Psychiatric: Mood and affect normal  Neck: No JVD, no carotid bruits, no thyromegaly, no lymphadenopathy.  Lungs:Clear bilaterally, no wheezes, rhonci, crackles Cardiovascular: Regular rate and rhythm. No murmurs, gallops or rubs. Abdomen:Soft. Bowel sounds present. Non-tender.  Extremities: No lower extremity edema. Pulses are 2 + in the bilateral DP/PT.  Echo 12/16/14: Left ventricle: The cavity size was normal. Wall thickness was increased in a pattern of moderate LVH. Systolic function was normal. The estimated ejection  fraction was in the range of 60% to 65%. Although no diagnostic regional wall motion abnormality was identified, this possibility cannot be completely excluded on the basis of this study. Doppler parameters are consistent with abnormal left ventricular relaxation (grade 1 diastolic dysfunction). - Ventricular septum: Mild D-shape to the interventricular septum, suggestive of RV pressure/volume overload. - Aortic valve: Bioprosthetic aortic valve is present. The gradient across the bioprosthetic aortic valve was not significantly elevated. There was no regurgitation. Mean gradient (S): 14 mm Hg. - Aorta: Mildly dilated aortic root. Aortic root dimension: 38 mm (ED). - Mitral valve: Mildly calcified annulus. Mildly calcified leaflets . There was no significant regurgitation. - Left atrium: The atrium was mildly dilated. - Right ventricle: Poorly visualized. The cavity size was mildly dilated. Systolic function was mildly reduced. - Right atrium: The atrium was mildly dilated. - Pulmonary arteries: No complete TR doppler jet so unable to estimate PA systolic pressure. - Inferior vena cava: The vessel was normal in size. The respirophasic diameter changes were in the normal range (>= 50%), consistent with normal central venous pressure.  Impressions:  - Technically difficult study with poor acoustic windows. Normal LV size with moderate LV hypertrophy. EF 60-65%. Mildly D-shaped interventricular septum, suggestive of RV pressure/volume overload. The RV was poorly visualized but appeared mildly dilated with mildly decreased systolic function. Bioprosthetic aortic valve appeared to function normally.   EKG:  EKG is ordered today. The ekg ordered today demonstrates NSR, rate 69 bpm. RBBB  Recent Labs: 11/08/2017: ALT 13; BUN 14; Creatinine, Ser 1.05; Hemoglobin 14.8; Platelets 263.0; Potassium 3.0; Sodium 140; TSH 1.30   Lipid Panel      Component Value Date/Time   CHOL 157 11/08/2017 1602   TRIG 173.0 (H) 11/08/2017 1602   HDL 30.50 (L) 11/08/2017 1602   CHOLHDL 5 11/08/2017 1602   VLDL 34.6 11/08/2017 1602   LDLCALC 92 11/08/2017 1602     Wt Readings from Last 3 Encounters:  01/29/18 (!) 343 lb (155.6 kg)  11/08/17 (!) 337 lb 9.6 oz (153.1 kg)  08/31/17 (!) 340 lb 2 oz (154.3 kg)     Other studies Reviewed: Additional studies/ records that were reviewed today include: . Review of the above records demonstrates:   Assessment and Plan:   1. CAD s/p CABG without angina: He had a single vessel bypass to RCA at the time of his root replacement and AVR. No significant coronary atherosclerosis noted prior to his surgery. He has not been on a statin. He has no chest pain. I will plan to continue the ASA and beta blocker.    2. Chronic diastolic CHF: His weight is stable. No evidence of volume overload. Will continue Lasix 40 mg po BID.   3. HYPERTENSION: BP has been managed in primary care. BP is up this am but   4. S/P aortic valve replacement and aortoplasty: LV function normal by echo April 2016.  AVR working well with mild expected gradient across valve. No change in symptoms. Will repeat echo in April 2020. He will continue using SBP prophylaxis prior to dental procedures  Current medicines are reviewed at length with the patient today.  The patient does not have concerns regarding medicines.  The following changes have been made:  no change  Labs/ tests ordered today include:   Orders Placed This Encounter  Procedures  . EKG 12-Lead    Disposition:   FU with me in 12 months  Signed, Lauree Chandler, MD 01/29/2018 9:51 AM    [facility] [address], [place], [place]  [address] Phone: [phone]; Fax: [phone]

## 2018-01-30 ENCOUNTER — Other Ambulatory Visit: Payer: Self-pay

## 2018-01-30 MED ORDER — TRIAMTERENE-HCTZ 37.5-25 MG PO TABS: 1.0000 | ORAL_TABLET | Freq: Every day | ORAL | 0 refills | Status: DC

## 2018-02-02 ENCOUNTER — Other Ambulatory Visit: Payer: Self-pay

## 2018-02-02 ENCOUNTER — Emergency Department (INDEPENDENT_AMBULATORY_CARE_PROVIDER_SITE_OTHER)
Admission: EM | Admit: 2018-02-02 | Discharge: 2018-02-02 | Disposition: A | Discharge status: Home / Self Care | Payer: Medicare Other | Source: Home / Self Care | Attending: Family Medicine | Admitting: Family Medicine

## 2018-02-02 ENCOUNTER — Encounter: Payer: Self-pay | Admitting: Emergency Medicine

## 2018-02-02 ENCOUNTER — Emergency Department (INDEPENDENT_AMBULATORY_CARE_PROVIDER_SITE_OTHER): Payer: Medicare Other

## 2018-02-02 DIAGNOSIS — S8991XA Unspecified injury of right lower leg, initial encounter: Secondary | ICD-10-CM

## 2018-02-02 DIAGNOSIS — M25561 Pain in right knee: Secondary | ICD-10-CM | POA: Diagnosis not present

## 2018-02-02 NOTE — Discharge Instructions (Signed)
°  Please follow up with family medicine or sports medicine in 1 week if not improving, sooner if worsening.

## 2018-02-02 NOTE — ED Provider Notes (Signed)
[facility]    CSN: [id number] Arrival date & time: 02/02/18  1554     History   Chief Complaint Chief Complaint  Patient presents with  . Knee Pain    HPI David Parsons is a 77 y.o. male.   HPI David Parsons is a 77 y.o. male presenting to UC with c/o sudden onset Right knee pain that started this afternoon while carrying a jug of water up a step from his garage into his house. He felt his knee "rip and pull" then was unable to apply weight w/o severe pain.  Minimal to no pain at rest w/o putting weight on it.  No prior injury or surgery to same knee. No medication taken PTA as pain is minimal when sitting.    Past Medical History:  Diagnosis Date  . Aortic insufficiency     02/16/09 Chest CT:  The aortic root has a diameter measuring 4.6 cm, image 64 of the coronal series.  The ascending    thoracic aorta has a diameter of 3.8 cm, image 65 of the coronal series.  At the level of the aortic arch    the thoracic aorta has a maximum diameter of 3.2 cm.  The descending thoracic aorta has a maximum           diameter of 3.2 cm.  There is no evidence for aortic disse        . Aortic root enlargement (HCC)    The aortic root has a diameter measuring 4.6 cm, image 64 of the coronal series.  The ascending  thoracic aorta has a diameter of 3.8 cm, image 65 of the coronal series.  At the level of the aortic arch   the thoracic aorta has a maximum diameter of 3.2 cm.  The descending thoracic aorta has a maximum   diameter of 3.2 cm.  There is no evidence for aortic dissection.       Marland Kitchen CAD (coronary artery disease)   . Cardiomyopathy     01/2009 - Cath - nonobs. dzs.  EF 35%.  . CHF (congestive heart failure) ([place])   . Diabetes mellitus   . Dyslipidemia 01/14/2013  . Gallstones    s/p cholecystectomy    . GERD (gastroesophageal reflux disease)   . Glaucoma 10/05/2016  . Heart murmur   . Helicobacter pylori gastritis    ([place] 4/10)  . Hypertension   . Hypokalemia   .  Iron deficiency anemia   . Obesity, Class III, BMI 40-49.9 (morbid obesity) ([place])   . Onychomycosis 03/09/2015  . PONV (postoperative nausea and vomiting)   . 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 Sleep apnea    occasionally wears CPAP at night    Patient Active Problem List   Diagnosis Date Noted  . Glaucoma 10/05/2016  . Onychomycosis 03/09/2015  . Hypokalemia 07/26/2014  . Medicare annual wellness visit, subsequent 04/20/2014  . Dyslipidemia 01/14/2013  . 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 Follow-up examination, following unspecified surgery 01/22/2012  . 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 Dystrophic nail 10/23/2011  . Iron deficiency anemia 06/29/2011  . Personal history of colonic polyps 06/29/2011  . Family history of malignant neoplasm of gastrointestinal tract 06/29/2011  .  PSA, INCREASED 09/12/2010  . CAD, NATIVE VESSEL 01/18/2010  . Obesity, Class III, BMI 40-49.9 (morbid obesity) ([place]) 11/29/2009  . Essential hypertension 04/05/2009  . Hyperglycemia 04/05/2009  . 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  . AORTIC INSUFFICIENCY, MODERATE 02/17/2009  . CARDIOMYOPATHY, DILATED 02/17/2009  . Thoracic aneurysm without mention of rupture 01/26/2009  . Congestive heart failure ([place]) 01/04/2009  . NONSPECIFIC ABNORMAL ELECTROCARDIOGRAM 01/04/2009    Past Surgical History:  Procedure Laterality Date  . ADRENALECTOMY    . BENTALL PROCEDURE  12/28/2011   Procedure: BENTALL PROCEDURE;  Surgeon: Rexene Alberts, MD;  Location: [facility];  Service: Open Heart Surgery;  Laterality: N/A;  . CARDIAC CATHETERIZATION     2013  . CARDIAC VALVE REPLACEMENT  12/28/2011   Bentall with tissue valve  .  CHOLECYSTECTOMY    . COLONOSCOPY  09/25/2011  . COLONOSCOPY W/ BIOPSIES AND POLYPECTOMY  11/04/2008   colon polyps, diverticulosis   . CORONARY ARTERY BYPASS GRAFT  12/28/2011   Procedure: CORONARY ARTERY BYPASS GRAFTING (CABG);  Surgeon: Rexene Alberts, MD;  Location: [facility];  Service: Open Heart Surgery;  Laterality: N/A;  cabg x 1  . LEFT AND RIGHT HEART CATHETERIZATION WITH CORONARY ANGIOGRAM N/A 11/22/2011   Procedure: LEFT AND RIGHT HEART CATHETERIZATION WITH CORONARY ANGIOGRAM;  Surgeon: Burnell Blanks, MD;  Location: [facility] CATH LAB;  Service: Cardiovascular;  Laterality: N/A;  . MULTIPLE TOOTH EXTRACTIONS    . TEE WITHOUT CARDIOVERSION  11/22/2011   Procedure: TRANSESOPHAGEAL ECHOCARDIOGRAM (TEE);  Surgeon: Larey Dresser, MD;  Location: [facility];  Service: Cardiovascular;  Laterality: N/A;  . UPPER GASTROINTESTINAL ENDOSCOPY  11/04/2008   w/bx, H pylori gastritis  . UPPER GASTROINTESTINAL ENDOSCOPY  09/25/2011       Home Medications    Prior to Admission medications   Medication Sig Start Date End Date Taking? Authorizing Provider  amLODipine (NORVASC) 10 MG tablet TAKE 1 TABLET BY MOUTH EVERY DAY 12/04/17   [name], MD  aspirin EC 81 MG tablet Take 81 mg by mouth daily.    [provider]  augmented betamethasone dipropionate (DIPROLENE-AF) 0.05 % cream Apply topically 2 (two) times daily. 08/31/17   Colon Branch, MD  azelastine (ASTELIN) 0.1 % nasal spray Place 2 sprays into both nostrils 2 (two) times daily. Use in each nostril as directed 12/20/16   Saguier, Percell Miller, PA-C  carvedilol (COREG) 25 MG tablet Take 1 tablet (25 mg total) by mouth 2 (two) times daily with a meal. 10/25/17   [name], MD  clobetasol ointment (TEMOVATE) 0.05 % apply to affected area twice a day UPON ITCHING AND IRRITATION(NOT TO FACE OR GROIN) 01/21/15   [provider]  ferrous sulfate 325 (65 FE) MG tablet Take 325 mg by mouth 2 (two) times daily before a meal.    [provider]  fluticasone (FLONASE) 50 MCG/ACT nasal spray Place 2 sprays into both nostrils daily. 12/01/15   Saguier, Percell Miller, PA-C  furosemide (LASIX) 40 MG tablet TAKE 1 TABLET BY MOUTH TWICE A DAY 01/15/18   Burnell Blanks, MD  HYDROcodone-homatropine [facility]) 5-1.5 MG/5ML syrup Take 5 mLs by mouth every 8 (eight) hours as needed for cough. 12/25/16   Saguier, Percell Miller, PA-C  hydrocortisone 2.5 % cream Apply 1 application topically 2 (two) times daily.  10/15/15   [provider]  levocetirizine (XYZAL) 5 MG tablet TAKE 1 TABLET EVERY EVENING 03/26/17   [name], MD  losartan (COZAAR)  100 MG tablet TAKE 1 TABLET (100 MG TOTAL) BY MOUTH DAILY. PLEASE KEEP UPCOMING APPT FOR FUTURE REFILLS. THANK YOU 01/01/18   Burnell Blanks, MD  Multiple Vitamin (MULITIVITAMIN WITH MINERALS) TABS Take 1 tablet by mouth daily.    [provider]  omeprazole (PRILOSEC) 40 MG capsule TAKE ONE CAPSULE EVERY DAY 12/13/17   [name], MD  potassium chloride SA (KLOR-CON M20) 20 MEQ tablet TAKE 3 TABLETS IN THE MORNING, TAKE 2 TABLETS AT LUNCH AND TAKE 2 TABLETS IN THE EVENING. 11/20/17   [name], MD  potassium chloride SA (KLOR-CON M20) 20 MEQ tablet TAKE 3 TABLETS IN THE MORNING, TAKE 2 TABLETS AT LUNCH AND TAKE 2 TABLETS IN THE EVENING. 11/20/17   [name], MD  triamterene-hydrochlorothiazide (MAXZIDE-25) 37.5-25 MG tablet Take 1 tablet by mouth daily. 01/30/18   [name], MD    Family History Family History  Problem Relation Age of Onset  . Breast cancer Sister   . Diabetes Sister   . Colon cancer Brother 70       at least 30  . Diabetes Brother   . Cancer Brother        colon, prostate  . Prostate cancer Brother   . Diabetes Brother   . Diabetes Sister   . Cancer Sister        breast, colon, melanoma  . Heart disease Sister   . Hypertension Mother   . Arthritis Mother   . Kidney failure Sister   . Colon cancer Sister   . Breast cancer Sister    . Kidney disease Sister        s/p transplant  . Hypertension Sister   . Diabetes Sister   . Stroke Father   . Hypertension Father     Social History Social History   Tobacco Use  . Smoking status: Never Smoker  . Smokeless tobacco: Never Used  Substance Use Topics  . Alcohol use: No  . Drug use: No     Allergies   Latex   Review of Systems Review of Systems  Musculoskeletal: Positive for arthralgias. Negative for joint swelling and myalgias.  Skin: Negative for color change and wound.  Neurological: Negative for weakness and numbness.     Physical Exam Triage Vital Signs ED Triage Vitals  Enc Vitals Group     BP 02/02/18 1624 (!) 156/78     Pulse Rate 02/02/18 1624 68     Resp 02/02/18 1624 18     Temp --      Temp src --      SpO2 02/02/18 1624 97 %     Weight 02/02/18 1625 (!) 328 lb (148.8 kg)     Height 02/02/18 1625 6' (1.829 m)     Head Circumference --      Peak Flow --      Pain Score 02/02/18 1624 9     Pain Loc --      Pain Edu? --      Excl. in [place]? --    No data found.  Updated Vital Signs BP (!) 156/78 (BP Location: Right Arm)   Pulse 68   Resp 18   Ht 6' (1.829 m)   Wt (!) 328 lb (148.8 kg)   SpO2 97%   BMI 44.48 kg/m   Visual Acuity Right Eye Distance:   Left Eye Distance:   Bilateral Distance:    Right Eye Near:   Left Eye Near:  Bilateral Near:     Physical Exam  Constitutional: He is oriented to person, place, and time. He appears well-developed and well-nourished.  Obese male sitting in a wheelchair, NAD  HENT:  Head: Normocephalic and atraumatic.  Eyes: EOM are normal.  Neck: Normal range of motion.  Cardiovascular: Normal rate.  Pulmonary/Chest: Effort normal.  Musculoskeletal: Normal range of motion. He exhibits tenderness. He exhibits no edema.  Right knee: no obvious edema, full ROM. Minimal tenderness to anterior aspect.  Calf is soft, non-tender.  Neurological: He is alert and oriented to person, place,  and time.  Skin: Skin is warm and dry.  Psychiatric: He has a normal mood and affect. His behavior is normal.  Nursing note and vitals reviewed.    UC Treatments / Results  Labs (all labs ordered are listed, but only abnormal results are displayed) Labs Reviewed - No data to display  EKG None  Radiology Dg Knee Complete 4 Views Right  Result Date: 02/02/2018 CLINICAL DATA:  Status post fall today, posterior RIGHT knee pain. EXAM: RIGHT KNEE - COMPLETE 4+ VIEW COMPARISON:  None. FINDINGS: No evidence of fracture, dislocation, or joint effusion. No evidence of arthropathy or other focal bone abnormality. Soft tissues are unremarkable. IMPRESSION: Negative. Electronically Signed   By: Franki Cabot M.D.   On: 02/02/2018 17:14    Procedures Procedures (including critical care time)  Medications Ordered in UC Medications - No data to display  Initial Impression / Assessment and Plan / UC Course  I have reviewed the triage vital signs and the nursing notes.  Pertinent labs & imaging results that were available during my care of the patient were reviewed by me and considered in my medical decision making (see chart for details).     Discussed imaging with pt. Will tx as sprain. Ace wrap provided for comfort.  Pt info packet provided,.   Final Clinical Impressions(s) / UC Diagnoses   Final diagnoses:  Right knee injury, initial encounter  Acute pain of right knee     Discharge Instructions      Please follow up with family medicine or sports medicine in 1 week if not improving, sooner if worsening.     ED Prescriptions    None     Controlled Substance Prescriptions [place] Controlled Substance Registry consulted? Not Applicable   Tyrell Antonio 02/02/18 1756

## 2018-02-02 NOTE — ED Triage Notes (Signed)
Patient had right knee give out on him and he fell today; now cannot bear weight and arrived using cane; helped into w/c.

## 2018-02-04 ENCOUNTER — Encounter: Payer: Self-pay | Admitting: Family Medicine

## 2018-02-04 ENCOUNTER — Ambulatory Visit (INDEPENDENT_AMBULATORY_CARE_PROVIDER_SITE_OTHER): Payer: Medicare Other | Admitting: Family Medicine

## 2018-02-04 VITALS — BP 174/85 | HR 68 | Wt 348.0 lb

## 2018-02-04 DIAGNOSIS — M25561 Pain in right knee: Secondary | ICD-10-CM

## 2018-02-04 DIAGNOSIS — I2510 Atherosclerotic heart disease of native coronary artery without angina pectoris: Secondary | ICD-10-CM | POA: Diagnosis not present

## 2018-02-04 MED ORDER — DICLOFENAC SODIUM 1 % TD GEL: 4.0000 g | Freq: Four times a day (QID) | TRANSDERMAL | 11 refills | Status: DC

## 2018-02-04 NOTE — Patient Instructions (Signed)
Thank you for coming in today. Try the diclofenac gel 4x daily for pain.  Continue activity as tolerated.  Use the cane as needed.  Let me know if you want a walker. Recheck in about 2 weeks.    Meniscus Tear A meniscus tear is a knee injury in which a piece of the meniscus is torn. The meniscus is a thick, rubbery, wedge-shaped cartilage in the knee. Two menisci are located in each knee. They sit between the upper bone (femur) and lower bone (tibia) that make up the knee joint. Each meniscus acts as a shock absorber for the knee. A torn meniscus is one of the most common types of knee injuries. This injury can range from mild to severe. Surgery may be needed for a severe tear. What are the causes? This injury may be caused by any squatting, twisting, or pivoting movement. Sports-related injuries are the most common cause. These often occur from:  Running and stopping suddenly.  Changing direction.  Being tackled or knocked off your feet.  As people get older, their meniscus gets thinner and weaker. In these people, tears can happen more easily, such as from climbing stairs. What increases the risk? This injury is more likely to happen to:  People who play contact sports.  Males.  People who are 76?76 years of age.  What are the signs or symptoms? Symptoms of this injury include:  Knee pain, especially at the side of the knee joint. You may feel pain when the injury occurs, or you may only hear a pop and feel pain later.  A feeling that your knee is clicking, catching, locking, or giving way.  Not being able to fully bend or extend your knee.  Bruising or swelling in your knee.  How is this diagnosed? This injury may be diagnosed based on your symptoms and a physical exam. The physical exam may include:  Moving your knee in different ways.  Feeling for tenderness.  Listening for a clicking sound.  Checking if your knee locks or catches.  You may also have tests,  such as:  X-rays.  MRI.  A procedure to look inside your knee with a narrow surgical telescope (arthroscopy).  You may be referred to a knee specialist (orthopedic surgeon). How is this treated? Treatment for this injury depends on the severity of the tear. Treatment for a mild tear may include:  Rest.  Medicine to reduce pain and swelling. This is usually a nonsteroidal anti-inflammatory drug (NSAID).  A knee brace or an elastic sleeve or wrap.  Using crutches or a walker to keep weight off your knee and to help you walk.  Exercises to strengthen your knee (physical therapy).  You may need surgery if you have a severe tear or if other treatments are not working. Follow these instructions at home: Managing pain and swelling  Take over-the-counter and prescription medicines only as told by your health care provider.  If directed, apply ice to the injured area: ? Put ice in a plastic bag. ? Place a towel between your skin and the bag. ? Leave the ice on for 20 minutes, 2-3 times per day.  Raise (elevate) the injured area above the level of your heart while you are sitting or lying down. Activity  Do not use the injured limb to support your body weight until your health care provider says that you can. Use crutches or a walker as told by your health care provider.  Return to your normal activities  as told by your health care provider. Ask your health care provider what activities are safe for you.  Perform range-of-motion exercises only as told by your health care provider.  Begin doing exercises to strengthen your knee and leg muscles only as told by your health care provider. After you recover, your health care provider may recommend these exercises to help prevent another injury. General instructions  Use a knee brace or elastic wrap as told by your health care provider.  Keep all follow-up visits as told by your health care provider. This is important. Contact a  health care provider if:  You have a fever.  Your knee becomes red, tender, or swollen.  Your pain medicine is not helping.  Your symptoms get worse or do not improve after 2 weeks of home care. This information is not intended to replace advice given to you by your health care provider. Make sure you discuss any questions you have with your health care provider. Document Released: 10/28/2002 Document Revised: 01/13/2016 Document Reviewed: 11/30/2014 Elsevier Interactive Patient Education  [employer].

## 2018-02-04 NOTE — Progress Notes (Signed)
Subjective:    CC: Right Knee Pain  HPI: [name] was in his normal state of health on June 15.  He was carrying a heavy object and climbing a set of stairs when he felt his right knee pop with significant pain.  He developed knee swelling and pain with ambulation.  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  He was asked to follow-up with me.  He notes moderate knee pain and swelling.  He is tried using an Ace wrap which did not help much.  He is tried ibuprofen which has helped a bit.  He notes pain with ambulation.  He is using a golf club as a cane to ambulate.  He feels pretty well otherwise.  Past medical history, Surgical history, Family history not pertinant except as noted below, Social history, Allergies, and medications have been entered into the medical record, reviewed, and no changes needed.   Review of Systems: No headache, visual changes, nausea, vomiting, diarrhea, constipation, dizziness, abdominal pain, skin rash, fevers, chills, night sweats, weight loss, swollen lymph nodes, body aches,  muscle aches, chest pain, shortness of breath, mood changes, visual or auditory hallucinations.   Objective:    Vitals:   02/04/18 1038 02/04/18 1117  BP: (!) 180/75 (!) 174/85  Pulse: 70 68   General: Well Developed, well nourished, and in no acute distress.  Neuro/Psych: Alert and oriented x3, extra-ocular muscles intact, able to move all 4 extremities, sensation grossly intact. Skin: Warm and dry, no rashes noted.  Respiratory: Not using accessory muscles, speaking in full sentences, trachea midline.  Cardiovascular: Pulses palpable, no extremity edema. Abdomen: Does not appear distended. MSK:  Right knee: Mild effusion Range of motion 5-100 degrees with crepitations. Tender to palpation medial joint line.  Stable anterior and posterior drawer testing. Stable valgus and varus stress test. Significantly positive  medial McMurray's test Intact flexion and extension strength. Antalgic gait.     Lab and Radiology Results No results found for this or any previous visit (from the past 72 hour(s)). Dg Knee Complete 4 Views Right  Result Date: 02/02/2018 CLINICAL DATA:  Status post fall today, posterior RIGHT knee pain. EXAM: RIGHT KNEE - COMPLETE 4+ VIEW COMPARISON:  None. FINDINGS: No evidence of fracture, dislocation, or joint effusion. No evidence of arthropathy or other focal bone abnormality. Soft tissues are unremarkable. IMPRESSION: Negative. Electronically Signed   By: Franki Cabot M.D.   On: 02/02/2018 17:14  I personally (independently) visualized and performed the interpretation of the images attached in this note.  Limited musculoskeletal ultrasound of the right knee reveals intact quadriceps and patella tendon with moderate effusion. Degenerative appearing medial and lateral meniscus. No significant Baker's cyst. Normal bony structures for age with DJD changes present.   Impression and Recommendations:    Assessment and Plan: 77 y.o. male with right knee pain.  Concerning for exacerbation of DJD versus meniscus injury.  Patient without severe mechanical symptoms today.  We discussed treatment options.  Plan for trial of diclofenac gel topically.  Next step if not better would be trial of steroid injection.  Recheck in 2 weeks..  Blood pressure is elevated today likely due to pain and oral NSAID use.  Recommend avoiding oral ibuprofen and using topical diclofenac.  Continue current antihypertensive management via PCP.  Will recheck blood pressure in 2 weeks.  No orders of the defined types were placed in this encounter.  Meds ordered this encounter  Medications  . diclofenac sodium (VOLTAREN) 1 % GEL    Sig: Apply 4 g topically 4 (four) times daily. To affected joint.    Dispense:  100 g    Refill:  11    Knee DJD. Pt tried and failled ibuprofen and naproxen    Discussed warning  signs or symptoms. Please see discharge instructions. Patient expresses understanding.  CC: [name], MD

## 2018-02-05 ENCOUNTER — Other Ambulatory Visit: Payer: Self-pay | Admitting: Cardiovascular Disease

## 2018-02-06 ENCOUNTER — Ambulatory Visit: Payer: Medicare Other | Admitting: Podiatry

## 2018-02-18 ENCOUNTER — Other Ambulatory Visit: Payer: Self-pay | Admitting: Cardiovascular Disease

## 2018-02-18 DIAGNOSIS — I5033 Acute on chronic diastolic (congestive) heart failure: Secondary | ICD-10-CM

## 2018-02-19 ENCOUNTER — Ambulatory Visit: Payer: Medicare Other | Admitting: Family Medicine

## 2018-02-25 ENCOUNTER — Encounter: Payer: Self-pay | Admitting: Family Medicine

## 2018-02-25 ENCOUNTER — Ambulatory Visit (INDEPENDENT_AMBULATORY_CARE_PROVIDER_SITE_OTHER): Payer: Medicare Other | Admitting: Family Medicine

## 2018-02-25 VITALS — BP 169/78 | HR 72 | Ht 73.0 in | Wt 337.0 lb

## 2018-02-25 DIAGNOSIS — I2510 Atherosclerotic heart disease of native coronary artery without angina pectoris: Secondary | ICD-10-CM | POA: Diagnosis not present

## 2018-02-25 DIAGNOSIS — I10 Essential (primary) hypertension: Secondary | ICD-10-CM

## 2018-02-25 DIAGNOSIS — M25561 Pain in right knee: Secondary | ICD-10-CM

## 2018-02-25 NOTE — Patient Instructions (Signed)
Thank you for coming in today. Continue BP medicine.  Recheck with me as needed.  Use the diclofenac gel as needed.

## 2018-02-25 NOTE — Progress Notes (Signed)
David Parsons is a 77 y.o. male who presents to [facility] today for follow-up knee pain.  David Parsons was seen June 17 for acute right knee pain.  This was thought to be either a meniscus injury or acute exacerbation of chronic DJD.  He was treated with diclofenac gel and relative rest.  He has had considerable symptom improvement in his back to his baseline.  He denies significant knee pain locking or catching and feels quite well.  He notes that he did not take his blood pressure medication this morning.    ROS:  As above  Exam:  BP (!) 169/78   Pulse 72   Ht 6\' 1"  (1.854 m)   Wt (!) 337 lb (152.9 kg)   BMI 44.46 kg/m  General: Well Developed, well nourished, and in no acute distress.  Neuro/Psych: Alert and oriented x3, extra-ocular muscles intact, able to move all 4 extremities, sensation grossly intact. Skin: Warm and dry, no rashes noted.  Respiratory: Not using accessory muscles, speaking in full sentences, trachea midline.  Cardiovascular: Pulses palpable, no extremity edema. Abdomen: Does not appear distended. MSK:  Right knee mild effusion otherwise normal-appearing Range of motion 3-120 degrees. Nontender. Normal extension and flexion strength. Normal gait.   EXAM: RIGHT KNEE - COMPLETE 4+ VIEW  COMPARISON:  None.  FINDINGS: No evidence of fracture, dislocation, or joint effusion. No evidence of arthropathy or other focal bone abnormality. Soft tissues are unremarkable.  IMPRESSION: Negative.   Electronically Signed   By: Franki Cabot M.D.   On: 02/02/2018 17:14 I personally (independently) visualized and performed the interpretation of the images attached in this note.    Assessment and Plan: 77 y.o. male with  Resolved right knee pain.  Pain was likely due to DJD exacerbation on her potential loose body.  As he is currently asymptomatic we will proceed with watchful waiting and recheck as  needed.  Blood pressure remains elevated however patient did not take his medication this morning.  I recommend restart medication complete home blood pressure log and follow-up with PCP if elevated.   Historical information moved to improve visibility of documentation.  Past Medical History:  Diagnosis Date  . Aortic insufficiency     02/16/09 Chest CT:  The aortic root has a diameter measuring 4.6 cm, image 64 of the coronal series.  The ascending    thoracic aorta has a diameter of 3.8 cm, image 65 of the coronal series.  At the level of the aortic arch    the thoracic aorta has a maximum diameter of 3.2 cm.  The descending thoracic aorta has a maximum           diameter of 3.2 cm.  There is no evidence for aortic disse        . Aortic root enlargement (HCC)    The aortic root has a diameter measuring 4.6 cm, image 64 of the coronal series.  The ascending  thoracic aorta has a diameter of 3.8 cm, image 65 of the coronal series.  At the level of the aortic arch   the thoracic aorta has a maximum diameter of 3.2 cm.  The descending thoracic aorta has a maximum   diameter of 3.2 cm.  There is no evidence for aortic dissection.       Marland Kitchen CAD (coronary artery disease)   . Cardiomyopathy     01/2009 - Cath - nonobs. dzs.  EF 35%.  . CHF (congestive  heart failure) ([place])   . Diabetes mellitus   . Dyslipidemia 01/14/2013  . Gallstones    s/p cholecystectomy    . GERD (gastroesophageal reflux disease)   . Glaucoma 10/05/2016  . Heart murmur   . Helicobacter pylori gastritis    ([place] 4/10)  . Hypertension   . Hypokalemia   . Iron deficiency anemia   . Obesity, Class III, BMI 40-49.9 (morbid obesity) ([place])   . Onychomycosis 03/09/2015  . PONV (postoperative nausea and vomiting)   . 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 Sleep apnea    occasionally wears CPAP at night   Past Surgical History:  Procedure Laterality Date  . ADRENALECTOMY    . BENTALL PROCEDURE  12/28/2011   Procedure: BENTALL PROCEDURE;  Surgeon: Rexene Alberts, MD;  Location: [facility];  Service: Open Heart Surgery;  Laterality: N/A;  . CARDIAC CATHETERIZATION     2013  . CARDIAC VALVE REPLACEMENT  12/28/2011   Bentall with tissue valve  . CHOLECYSTECTOMY    . COLONOSCOPY  09/25/2011  . COLONOSCOPY W/ BIOPSIES AND POLYPECTOMY  11/04/2008   colon polyps, diverticulosis   . CORONARY ARTERY BYPASS GRAFT  12/28/2011   Procedure: CORONARY ARTERY BYPASS GRAFTING (CABG);  Surgeon: Rexene Alberts, MD;  Location: [facility];  Service: Open Heart Surgery;  Laterality: N/A;  cabg x 1  . LEFT AND RIGHT HEART CATHETERIZATION WITH CORONARY ANGIOGRAM N/A 11/22/2011   Procedure: LEFT AND RIGHT HEART CATHETERIZATION WITH CORONARY ANGIOGRAM;  Surgeon: Burnell Blanks, MD;  Location: [facility] CATH LAB;  Service: Cardiovascular;  Laterality: N/A;  . MULTIPLE TOOTH EXTRACTIONS    . TEE WITHOUT CARDIOVERSION  11/22/2011   Procedure: TRANSESOPHAGEAL ECHOCARDIOGRAM (TEE);  Surgeon: Larey Dresser, MD;  Location: [facility];  Service: Cardiovascular;  Laterality: N/A;  . UPPER GASTROINTESTINAL ENDOSCOPY  11/04/2008   w/bx, H pylori gastritis  . UPPER GASTROINTESTINAL ENDOSCOPY  09/25/2011   Social History   Tobacco Use  . Smoking status: Never Smoker  . Smokeless tobacco: Never Used  Substance Use Topics  . Alcohol use: No   family history includes Arthritis in his mother; Breast cancer in his sister and sister; Cancer in his brother and sister; Colon cancer in his sister; Colon cancer (age of onset: 74) in his brother; Diabetes in his brother, brother, sister, sister, and sister; Heart disease in his sister; Hypertension in his father, mother, and sister; Kidney disease in his sister;  Kidney failure in his sister; Prostate cancer in his brother; Stroke in his father.  Medications: Current Outpatient Medications  Medication Sig Dispense Refill  . amLODipine (NORVASC) 10 MG tablet TAKE 1 TABLET BY MOUTH EVERY DAY 90 tablet 0  . aspirin EC 81 MG tablet Take 81 mg by mouth daily.    Marland Kitchen augmented betamethasone dipropionate (DIPROLENE-AF) 0.05 % cream Apply topically 2 (two) times daily. 15 g 0  . azelastine (ASTELIN) 0.1 % nasal spray Place 2 sprays into both nostrils 2 (two) times daily. Use in each nostril as directed 30 mL 2  . carvedilol (COREG) 25 MG tablet Take 1 tablet (25 mg total) by mouth 2 (two) times daily with a meal. 180 tablet 0  . clobetasol ointment (TEMOVATE) 0.05 % apply to affected  area twice a day UPON ITCHING AND IRRITATION(NOT TO FACE OR GROIN)  0  . diclofenac sodium (VOLTAREN) 1 % GEL Apply 4 g topically 4 (four) times daily. To affected joint. 100 g 11  . ferrous sulfate 325 (65 FE) MG tablet Take 325 mg by mouth 2 (two) times daily before a meal.    . fluticasone (FLONASE) 50 MCG/ACT nasal spray Place 2 sprays into both nostrils daily. 16 g 1  . furosemide (LASIX) 40 MG tablet Take 1 tablet (40 mg total) by mouth 2 (two) times daily. 60 tablet 12  . hydrocortisone 2.5 % cream Apply 1 application topically 2 (two) times daily.   0  . levocetirizine (XYZAL) 5 MG tablet TAKE 1 TABLET EVERY EVENING 30 tablet 0  . losartan (COZAAR) 100 MG tablet TAKE 1 TABLET (100 MG TOTAL) BY MOUTH DAILY. PLEASE KEEP UPCOMING APPT FOR FUTURE REFILLS. THANK YOU 90 tablet 2  . Multiple Vitamin (MULITIVITAMIN WITH MINERALS) TABS Take 1 tablet by mouth daily.    Marland Kitchen omeprazole (PRILOSEC) 40 MG capsule TAKE ONE CAPSULE EVERY DAY 30 capsule 6  . potassium chloride SA (KLOR-CON M20) 20 MEQ tablet TAKE 3 TABLETS IN THE MORNING, TAKE 2 TABLETS AT LUNCH AND TAKE 2 TABLETS IN THE EVENING. 196 tablet 4  . triamterene-hydrochlorothiazide (MAXZIDE-25) 37.5-25 MG tablet Take 1 tablet by mouth  daily. 90 tablet 0   No current facility-administered medications for this visit.    Allergies  Allergen Reactions  . Latex Hives    Latex gloves       Discussed warning signs or symptoms. Please see discharge instructions. Patient expresses understanding.

## 2018-03-04 ENCOUNTER — Other Ambulatory Visit: Payer: Self-pay | Admitting: Family Medicine

## 2018-03-07 DIAGNOSIS — L280 Lichen simplex chronicus: Secondary | ICD-10-CM | POA: Diagnosis not present

## 2018-03-15 ENCOUNTER — Ambulatory Visit (INDEPENDENT_AMBULATORY_CARE_PROVIDER_SITE_OTHER): Payer: Medicare Other | Admitting: Podiatry

## 2018-03-15 ENCOUNTER — Encounter: Payer: Self-pay | Admitting: Podiatry

## 2018-03-15 DIAGNOSIS — B351 Tinea unguium: Secondary | ICD-10-CM | POA: Diagnosis not present

## 2018-03-15 DIAGNOSIS — M79674 Pain in right toe(s): Secondary | ICD-10-CM

## 2018-03-15 DIAGNOSIS — I2510 Atherosclerotic heart disease of native coronary artery without angina pectoris: Secondary | ICD-10-CM | POA: Diagnosis not present

## 2018-03-15 DIAGNOSIS — B353 Tinea pedis: Secondary | ICD-10-CM

## 2018-03-15 DIAGNOSIS — M79675 Pain in left toe(s): Secondary | ICD-10-CM

## 2018-03-18 ENCOUNTER — Telehealth: Payer: Self-pay | Admitting: Podiatry

## 2018-03-18 MED ORDER — CLOTRIMAZOLE-BETAMETHASONE 1-0.05 % EX CREA
TOPICAL_CREAM | CUTANEOUS | 1 refills | Status: AC
Start: 2018-03-18 — End: 2018-04-08

## 2018-03-18 NOTE — Telephone Encounter (Signed)
error 

## 2018-03-18 NOTE — Progress Notes (Signed)
Subjective: David Parsons is a 77 y.o. AAM who presents today with  cc of painful, discolored, thick toenails which interfere with daily activities and routine tasks.  Pain is aggravated when wearing enclosed shoe gear. Pain is getting progressively worse and relieved with periodic professional debridement.  Objective:  Vascular Examination: Capillary refill time immediate x 10 digits Dorsalis pedis and posterior tibial pulses present b/l No digital hair x 10 digits Skin temperature WNL b/l  Dermatological Examination: Skin turgor WNL b/l Toenails 1-5 b/l discolored, thick, dystrophic with subungual debris and pain with palpation to nailbeds due to thickness of nails. +Diffuse scaling noted plantarly and peripherally b/l feet with mild foot odor.  Musculoskeletal: Muscle strength 5/5 to all LE muscle groups  Neurological: Sensation intact with 10 gram monofilament. Vibratory sensation intact.  Assessment: 1. Painful onychomycosis toenails 1-5 b/l  2. Tinea pedis b/l  Plan: 1. 2019 ABN signed on today. 2. Toenails 1-5 b/l were debrided in length and girth without iatrogenic bleeding. 3. Rx for Lotrisone to be applied to both feet bid x 3 weeks for tinea pedis 4. Patient to continue soft, supportive shoe gear 5. Patient to report any pedal injuries to medical professional immediately. 6. Follow up 3 months. Patient/POA to call should there be a concern in the interim.

## 2018-04-16 DIAGNOSIS — L280 Lichen simplex chronicus: Secondary | ICD-10-CM | POA: Diagnosis not present

## 2018-05-04 ENCOUNTER — Other Ambulatory Visit: Payer: Self-pay | Admitting: Family Medicine

## 2018-05-07 ENCOUNTER — Other Ambulatory Visit: Payer: Self-pay | Admitting: Family Medicine

## 2018-05-14 ENCOUNTER — Ambulatory Visit (INDEPENDENT_AMBULATORY_CARE_PROVIDER_SITE_OTHER): Payer: Medicare Other | Admitting: Family Medicine

## 2018-05-14 ENCOUNTER — Encounter: Payer: Self-pay | Admitting: Family Medicine

## 2018-05-14 VITALS — BP 122/72 | HR 77 | Temp 97.8°F | Resp 18 | Ht 72.0 in | Wt 334.8 lb

## 2018-05-14 DIAGNOSIS — R739 Hyperglycemia, unspecified: Secondary | ICD-10-CM

## 2018-05-14 DIAGNOSIS — E785 Hyperlipidemia, unspecified: Secondary | ICD-10-CM

## 2018-05-14 DIAGNOSIS — I2510 Atherosclerotic heart disease of native coronary artery without angina pectoris: Secondary | ICD-10-CM | POA: Diagnosis not present

## 2018-05-14 DIAGNOSIS — Z23 Encounter for immunization: Secondary | ICD-10-CM

## 2018-05-14 DIAGNOSIS — I10 Essential (primary) hypertension: Secondary | ICD-10-CM | POA: Diagnosis not present

## 2018-05-14 DIAGNOSIS — L603 Nail dystrophy: Secondary | ICD-10-CM | POA: Diagnosis not present

## 2018-05-14 DIAGNOSIS — E876 Hypokalemia: Secondary | ICD-10-CM | POA: Diagnosis not present

## 2018-05-14 LAB — CBC
HEMATOCRIT: 43.1 % (ref 39.0–52.0)
HEMOGLOBIN: 14.4 g/dL (ref 13.0–17.0)
MCHC: 33.3 g/dL (ref 30.0–36.0)
MCV: 87.7 fl (ref 78.0–100.0)
Platelets: 269 10*3/uL (ref 150.0–400.0)
RBC: 4.92 Mil/uL (ref 4.22–5.81)
RDW: 13.5 % (ref 11.5–15.5)
WBC: 8.8 10*3/uL (ref 4.0–10.5)

## 2018-05-14 LAB — LIPID PANEL
CHOL/HDL RATIO: 4
Cholesterol: 146 mg/dL (ref 0–200)
HDL: 34.2 mg/dL — ABNORMAL LOW (ref >39.00)
LDL Cholesterol: 90 mg/dL (ref 0–99)
NONHDL: 112.1
Triglycerides: 111 mg/dL (ref 0.0–149.0)
VLDL: 22.2 mg/dL (ref 0.0–40.0)

## 2018-05-14 LAB — HEMOGLOBIN A1C: Hgb A1c MFr Bld: 5.8 % (ref 4.6–6.5)

## 2018-05-14 LAB — COMPREHENSIVE METABOLIC PANEL
ALBUMIN: 3.8 g/dL (ref 3.5–5.2)
ALK PHOS: 68 U/L (ref 39–117)
ALT: 12 U/L (ref 0–53)
AST: 12 U/L (ref 0–37)
BILIRUBIN TOTAL: 1 mg/dL (ref 0.2–1.2)
BUN: 15 mg/dL (ref 6–23)
CO2: 31 meq/L (ref 19–32)
CREATININE: 0.95 mg/dL (ref 0.40–1.50)
Calcium: 9.1 mg/dL (ref 8.4–10.5)
Chloride: 101 mEq/L (ref 96–112)
GFR: 99.07 mL/min (ref >60.00)
GLUCOSE: 114 mg/dL — AB (ref 70–99)
Potassium: 3.3 mEq/L — ABNORMAL LOW (ref 3.5–5.1)
SODIUM: 142 meq/L (ref 135–145)
Total Protein: 6.8 g/dL (ref 6.0–8.3)

## 2018-05-14 LAB — TSH: TSH: 1.89 u[IU]/mL (ref 0.35–4.50)

## 2018-05-14 MED ORDER — FERROUS SULFATE 325 (65 FE) MG PO TABS: 325.0000 mg | ORAL_TABLET | Freq: Two times a day (BID) | ORAL | 1 refills | Status: DC

## 2018-05-14 NOTE — Assessment & Plan Note (Signed)
Well controlled, no changes to meds. Encouraged heart healthy diet such as the DASH diet and exercise as tolerated.  °

## 2018-05-14 NOTE — Patient Instructions (Signed)
Carbohydrate Counting for hyperglycemia Carbohydrate counting is a method for keeping track of how many carbohydrates you eat. Eating carbohydrates naturally increases the amount of sugar (glucose) in the blood. Counting how many carbohydrates you eat helps keep your blood glucose within normal limits, which helps you manage your diabetes (diabetes mellitus). It is important to know how many carbohydrates you can safely have in each meal. This is different for every person. A diet and nutrition specialist (registered dietitian) can help you make a meal plan and calculate how many carbohydrates you should have at each meal and snack. Carbohydrates are found in the following foods:  Grains, such as breads and cereals.  Dried beans and soy products.  Starchy vegetables, such as potatoes, peas, and corn.  Fruit and fruit juices.  Milk and yogurt.  Sweets and snack foods, such as cake, cookies, candy, chips, and soft drinks.  How do I count carbohydrates? There are two ways to count carbohydrates in food. You can use either of the methods or a combination of both. Reading "Nutrition Facts" on packaged food The "Nutrition Facts" list is included on the labels of almost all packaged foods and beverages in the [place] It includes:  The serving size.  Information about nutrients in each serving, including the grams (g) of carbohydrate per serving.  To use the "Nutrition Facts":  Decide how many servings you will have.  Multiply the number of servings by the number of carbohydrates per serving.  The resulting number is the total amount of carbohydrates that you will be having.  Learning standard serving sizes of other foods When you eat foods containing carbohydrates that are not packaged or do not include "Nutrition Facts" on the label, you need to measure the servings in order to count the amount of carbohydrates:  Measure the foods that you will eat with a food scale or measuring cup, if  needed.  Decide how many standard-size servings you will eat.  Multiply the number of servings by 15. Most carbohydrate-rich foods have about 15 g of carbohydrates per serving. ? For example, if you eat 8 oz (170 g) of strawberries, you will have eaten 2 servings and 30 g of carbohydrates (2 servings x 15 g = 30 g).  For foods that have more than one food mixed, such as soups and casseroles, you must count the carbohydrates in each food that is included.  The following list contains standard serving sizes of common carbohydrate-rich foods. Each of these servings has about 15 g of carbohydrates:   hamburger bun or  English muffin.   oz (15 mL) syrup.   oz (14 g) jelly.  1 slice of bread.  1 six-inch tortilla.  3 oz (85 g) cooked rice or pasta.  4 oz (113 g) cooked dried beans.  4 oz (113 g) starchy vegetable, such as peas, corn, or potatoes.  4 oz (113 g) hot cereal.  4 oz (113 g) mashed potatoes or  of a large baked potato.  4 oz (113 g) canned or frozen fruit.  4 oz (120 mL) fruit juice.  4-6 crackers.  6 chicken nuggets.  6 oz (170 g) unsweetened dry cereal.  6 oz (170 g) plain fat-free yogurt or yogurt sweetened with artificial sweeteners.  8 oz (240 mL) milk.  8 oz (170 g) fresh fruit or one small piece of fruit.  24 oz (680 g) popped popcorn.  Example of carbohydrate counting Sample meal  3 oz (85 g) chicken breast.  6  oz (170 g) brown rice.  4 oz (113 g) corn.  8 oz (240 mL) milk.  8 oz (170 g) strawberries with sugar-free whipped topping. Carbohydrate calculation 1. Identify the foods that contain carbohydrates: ? Rice. ? Corn. ? Milk. ? Strawberries. 2. Calculate how many servings you have of each food: ? 2 servings rice. ? 1 serving corn. ? 1 serving milk. ? 1 serving strawberries. 3. Multiply each number of servings by 15 g: ? 2 servings rice x 15 g = 30 g. ? 1 serving corn x 15 g = 15 g. ? 1 serving milk x 15 g = 15 g. ? 1  serving strawberries x 15 g = 15 g. 4. Add together all of the amounts to find the total grams of carbohydrates eaten: ? 30 g + 15 g + 15 g + 15 g = 75 g of carbohydrates total. This information is not intended to replace advice given to you by your health care provider. Make sure you discuss any questions you have with your health care provider. Document Released: 08/07/2005 Document Revised: 02/25/2016 Document Reviewed: 01/19/2016 Elsevier Interactive Patient Education  [employer].

## 2018-05-14 NOTE — Assessment & Plan Note (Signed)
Follows with podiatry doing well

## 2018-05-14 NOTE — Assessment & Plan Note (Signed)
Takes KCL 3 in am, 2 at noon, 3 in pm. Check level

## 2018-05-14 NOTE — Assessment & Plan Note (Signed)
Encouraged heart healthy diet, increase exercise, avoid trans fats, consider a krill oil cap daily 

## 2018-05-14 NOTE — Assessment & Plan Note (Signed)
hgba1c acceptable, minimize simple carbs. Increase exercise as tolerated.  

## 2018-05-15 MED ORDER — POTASSIUM CHLORIDE CRYS ER 20 MEQ PO TBCR: 60.0000 meq | EXTENDED_RELEASE_TABLET | Freq: Three times a day (TID) | ORAL | 0 refills | Status: DC

## 2018-05-16 ENCOUNTER — Telehealth: Payer: Self-pay | Admitting: Family Medicine

## 2018-05-16 NOTE — Telephone Encounter (Signed)
Copied from [facility] [id number]. Topic: Quick Communication - Lab Results >> May 16, 2018  1:07 PM Cecelia Byars, NT wrote: Patient  returned call  from practice to  get results from recent labs ,and would like a call back at [phone]

## 2018-05-16 NOTE — Telephone Encounter (Addendum)
Pt given results/instructions per Dr Si Gaul; he verbalizes understanding and schedules lab appointment for 08/12/18 at 0830; also see result note dated 05/16/18.  ----- Message from Damita Dunnings, [place] sent at 05/15/2018 11:51 AM EDT ----- [facility] informing Pt to return call regarding labs. Potassium increased to 3 tabs tid and sent to pharmacy. Okay for PEC to discuss.

## 2018-05-28 ENCOUNTER — Other Ambulatory Visit: Payer: Self-pay | Admitting: Family Medicine

## 2018-06-07 ENCOUNTER — Ambulatory Visit (INDEPENDENT_AMBULATORY_CARE_PROVIDER_SITE_OTHER): Payer: Medicare Other | Admitting: Podiatry

## 2018-06-07 ENCOUNTER — Encounter: Payer: Self-pay | Admitting: Podiatry

## 2018-06-07 DIAGNOSIS — B353 Tinea pedis: Secondary | ICD-10-CM

## 2018-06-07 DIAGNOSIS — M79675 Pain in left toe(s): Secondary | ICD-10-CM

## 2018-06-07 DIAGNOSIS — M79674 Pain in right toe(s): Secondary | ICD-10-CM | POA: Diagnosis not present

## 2018-06-07 DIAGNOSIS — I2510 Atherosclerotic heart disease of native coronary artery without angina pectoris: Secondary | ICD-10-CM

## 2018-06-07 DIAGNOSIS — B351 Tinea unguium: Secondary | ICD-10-CM

## 2018-06-10 ENCOUNTER — Encounter: Payer: Self-pay | Admitting: Podiatry

## 2018-06-10 MED ORDER — CICLOPIROX OLAMINE 0.77 % EX CREA: TOPICAL_CREAM | Freq: Two times a day (BID) | CUTANEOUS | 1 refills | Status: AC

## 2018-06-14 ENCOUNTER — Ambulatory Visit: Payer: Medicare Other | Admitting: Podiatry

## 2018-06-16 NOTE — Progress Notes (Signed)
Mr. Bale presents today for follow up painful, mycotic toenails which respond to periodic debridement. He states he did not received his prescription for topical antifungal cream for his Athlete's feet on last visit.  Objective: Vascular Examination: Capillary refill time immediate x 10 digits Dorsalis pedis and posterior tibial pulses present b/l No digital hair x 10 digits Skin temperature WNL b/l  Dermatological Examination: Skin turgor WNL b/l Toenails 1-5 b/l discolored, thick, dystrophic with subungual debris and pain with palpation to nailbeds due to thickness of nails. +Diffuse scaling noted plantarly and peripherally b/l feet with mild foot odor.  Musculoskeletal: Muscle strength 5/5 to all LE muscle groups  Neurological: Sensation intact with 10 gram monofilament. Vibratory sensation intact.  Assessment: 1. Painful onychomycosis toenails 1-5 b/l  2. Tinea pedis b/l  Plan: 1. Toenails 1-5 b/l were debrided in length and girth without iatrogenic bleeding. 2. Rx for Ciclopirox Cream 0.77% to be applied to both feet bid x 4 weeks for tinea pedis 3. Patient to continue soft, supportive shoe gear 4. Patient to report any pedal injuries to medical professional immediately. 5. Follow up 3 months. Patient/POA to call should there be a concern in the interim.

## 2018-06-26 ENCOUNTER — Other Ambulatory Visit: Payer: Self-pay | Admitting: Family Medicine

## 2018-07-11 ENCOUNTER — Other Ambulatory Visit: Payer: Self-pay

## 2018-07-31 ENCOUNTER — Other Ambulatory Visit: Payer: Self-pay | Admitting: Family Medicine

## 2018-08-04 ENCOUNTER — Other Ambulatory Visit: Payer: Self-pay | Admitting: Family Medicine

## 2018-08-09 NOTE — Progress Notes (Addendum)
Subjective:   David Parsons is a 77 y.o. male who presents for Medicare Annual/Subsequent preventive examination.  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   Review of Systems: No ROS.  Medicare Wellness Visit. Additional risk factors are reflected in the social history. Cardiac Risk Factors include: advanced age (>58men, >31 women);dyslipidemia;hypertension;male gender;obesity (BMI >30kg/m2) Sleep patterns: no issues. 6 hrs per night.  Home Safety/Smoke Alarms: Feels safe in home. Smoke alarms in place. Lives with wife in 1 story home. Lives with wife in 1 story home. 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 3 stairs going into house with railing.   Eye- Dr.Digby every 6 months for glaucoma.   Male:   CCS- 09/25/11    PSA-  Lab Results  Component Value Date   PSA 7.32 (H) 09/07/2010       Objective:    Vitals: BP 140/82 (BP Location: Right Wrist, Patient Position: Sitting, Cuff Size: Normal)   Pulse 70   Ht 6' (1.829 m)   Wt (!) 335 lb 3.2 oz (152 kg)   SpO2 97%   BMI 45.46 kg/m   Body mass index is 45.46 kg/m.  Advanced Directives 08/09/2017 08/01/2016 04/22/2015 04/20/2014 12/28/2011 12/26/2011 11/22/2011  Does Patient Have a Medical Advance Directive? No No No No Patient does not have advance directive Patient does not have advance directive Patient does not have advance directive  Does patient want to make changes to medical advance directive? - Yes (MAU/Ambulatory/Procedural Areas - Information given) - - - - -  Would patient like information on creating a medical advance directive? Yes (MAU/Ambulatory/Procedural Areas - Information given) - Yes - Educational materials given Yes - Scientist, clinical (histocompatibility and immunogenetics) given - - -  Pre-existing out of facility DNR order (yellow form or pink MOST form) - - - - No - No    Tobacco Social History   Tobacco Use  Smoking Status Never Smoker  Smokeless Tobacco Never Used     Counseling given: Not Answered   Clinical Intake: Pain :  No/denies pain    Past Medical History:  Diagnosis Date  . Aortic insufficiency     02/16/09 Chest CT:  The aortic root has a diameter measuring 4.6 cm, image 64 of the coronal series.  The ascending    thoracic aorta has a diameter of 3.8 cm, image 65 of the coronal series.  At the level of the aortic arch    the thoracic aorta has a maximum diameter of 3.2 cm.  The descending thoracic aorta has a maximum           diameter of 3.2 cm.  There is no evidence for aortic disse        . Aortic root enlargement (HCC)    The aortic root has a diameter measuring 4.6 cm, image 64 of the coronal series.  The ascending  thoracic aorta has a diameter of 3.8 cm, image 65 of the coronal series.  At the level of the aortic arch   the thoracic aorta has a maximum diameter of 3.2 cm.  The descending thoracic aorta has a maximum   diameter of 3.2 cm.  There is no evidence for aortic dissection.       Marland Kitchen CAD (coronary artery disease)   . Cardiomyopathy     01/2009 - Cath - nonobs. dzs.  EF 35%.  . CHF (congestive heart failure) ([place])   . Diabetes mellitus   . Dyslipidemia 01/14/2013  . Gallstones    s/p  cholecystectomy    . GERD (gastroesophageal reflux disease)   . Glaucoma 10/05/2016  . Heart murmur   . Helicobacter pylori gastritis    (Tx Pylera 4/10)  . Hypertension   . Hypokalemia   . Iron deficiency anemia   . Obesity, Class III, BMI 40-49.9 (morbid obesity) ([place])   . Onychomycosis 03/09/2015  . PONV (postoperative nausea and vomiting)   . 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 Sleep apnea    occasionally wears CPAP at night   Past Surgical History:  Procedure Laterality Date  . ADRENALECTOMY    . BENTALL PROCEDURE  12/28/2011   Procedure: BENTALL  PROCEDURE;  Surgeon: Rexene Alberts, MD;  Location: [facility];  Service: Open Heart Surgery;  Laterality: N/A;  . CARDIAC CATHETERIZATION     2013  . CARDIAC VALVE REPLACEMENT  12/28/2011   Bentall with tissue valve  . CHOLECYSTECTOMY    . COLONOSCOPY  09/25/2011  . COLONOSCOPY W/ BIOPSIES AND POLYPECTOMY  11/04/2008   colon polyps, diverticulosis   . CORONARY ARTERY BYPASS GRAFT  12/28/2011   Procedure: CORONARY ARTERY BYPASS GRAFTING (CABG);  Surgeon: Rexene Alberts, MD;  Location: [place];  Service: Open Heart Surgery;  Laterality: N/A;  cabg x 1  . LEFT AND RIGHT HEART CATHETERIZATION WITH CORONARY ANGIOGRAM N/A 11/22/2011   Procedure: LEFT AND RIGHT HEART CATHETERIZATION WITH CORONARY ANGIOGRAM;  Surgeon: Burnell Blanks, MD;  Location: [facility] CATH LAB;  Service: Cardiovascular;  Laterality: N/A;  . MULTIPLE TOOTH EXTRACTIONS    . TEE WITHOUT CARDIOVERSION  11/22/2011   Procedure: TRANSESOPHAGEAL ECHOCARDIOGRAM (TEE);  Surgeon: Larey Dresser, MD;  Location: [place];  Service: Cardiovascular;  Laterality: N/A;  . UPPER GASTROINTESTINAL ENDOSCOPY  11/04/2008   w/bx, H pylori gastritis  . UPPER GASTROINTESTINAL ENDOSCOPY  09/25/2011   Family History  Problem Relation Age of Onset  . Breast cancer Sister   . Diabetes Sister   . Colon cancer Brother 70       at least 53  . Diabetes Brother   . Cancer Brother        colon, prostate  . Prostate cancer Brother   . Diabetes Brother   . Diabetes Sister   . Cancer Sister        breast, colon, melanoma  . Heart disease Sister   . Hypertension Mother   . Arthritis Mother   . Kidney failure Sister   . Colon cancer Sister   . Breast cancer Sister   . Kidney disease Sister        s/p transplant  . Hypertension Sister   . Diabetes Sister   . Stroke Father   . Hypertension Father    Social History   Socioeconomic History  . Marital status: Married    Spouse name: [name]  . Number of children: 0  . Years of education: Not on file  .  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 Financial resource strain: Not on file  . Food insecurity:    Worry: Not on file    Inability: Not on file  . Transportation needs:  Medical: Not on file    Non-medical: Not on file  Tobacco Use  . Smoking status: Never Smoker  . Smokeless tobacco: Never Used  Substance and Sexual Activity  . Alcohol use: No  . Drug use: No  . Sexual activity: Not Currently    Comment: lives with wife, pastros 2 churches  Lifestyle  . Physical activity:    Days per week: Not on file    Minutes per session: Not on file  . Stress: Not on file  Relationships  . Social connections:    Talks on phone: Not on file    Gets together: Not on file    Attends religious service: Not on file    Active member of club or organization: Not on file    Attends meetings of clubs or organizations: Not on file    Relationship status: Not on file  Other Topics Concern  . Not on file  Social History Narrative   The patient is married, lives with his wife in  [place].  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  He  lives a very sedentary lifestyle.  He is a nonsmoker.  He denies alcohol consumption.     Outpatient Encounter Medications as of 08/12/2018  Medication Sig  . amLODipine (NORVASC) 10 MG tablet TAKE 1 TABLET BY MOUTH EVERY DAY  . aspirin EC 81 MG tablet Take 81 mg by mouth daily.  Marland Kitchen augmented betamethasone dipropionate (DIPROLENE-AF) 0.05 % cream Apply topically 2 (two) times daily.  Marland Kitchen azelastine (ASTELIN) 0.1 % nasal spray Place 2 sprays into both nostrils 2 (two) times daily. Use in each nostril as directed  . carvedilol (COREG) 25 MG tablet TAKE 1 TABLET BY MOUTH 2 TIMES DAILY WITH A MEAL.  . clobetasol ointment (TEMOVATE) 0.05 % apply to affected area twice a day UPON ITCHING AND IRRITATION(NOT TO FACE OR GROIN)  . diclofenac  sodium (VOLTAREN) 1 % GEL Apply 4 g topically 4 (four) times daily. To affected joint.  . ferrous sulfate 325 (65 FE) MG tablet TAKE 1 TABLET BY MOUTH 2 (TWO) TIMES DAILY BEFORE A MEAL.  . fluticasone (FLONASE) 50 MCG/ACT nasal spray Place 2 sprays into both nostrils daily.  . furosemide (LASIX) 40 MG tablet Take 1 tablet (40 mg total) by mouth 2 (two) times daily.  . hydrocortisone 2.5 % cream Apply 1 application topically 2 (two) times daily.   Marland Kitchen levocetirizine (XYZAL) 5 MG tablet TAKE 1 TABLET EVERY EVENING  . losartan (COZAAR) 100 MG tablet TAKE 1 TABLET (100 MG TOTAL) BY MOUTH DAILY. PLEASE KEEP UPCOMING APPT FOR FUTURE REFILLS. THANK YOU  . Multiple Vitamin (MULITIVITAMIN WITH MINERALS) TABS Take 1 tablet by mouth daily.  Marland Kitchen omeprazole (PRILOSEC) 40 MG capsule Take 1 capsule (40 mg total) by mouth daily.  . potassium chloride SA (KLOR-CON M20) 20 MEQ tablet Take 3 tablets (60 mEq total) by mouth 3 (three) times daily.  Marland Kitchen triamterene-hydrochlorothiazide (MAXZIDE-25) 37.5-25 MG tablet TAKE 1 TABLET BY MOUTH EVERY DAY   No facility-administered encounter medications on file as of 08/12/2018.     Activities of Daily Living In your present state of health, do you have any difficulty performing the following activities: 08/12/2018  Hearing? N  Vision? N  Difficulty concentrating or making decisions? N  Walking or climbing stairs? N  Dressing or bathing? N  Doing errands, shopping? N  Preparing Food and eating ? N  Using the Toilet? N  In the past six months,  have you accidently leaked urine? N  Do you have problems with loss of bowel control? N  Managing your Medications? N  Managing your Finances? Y  Comment wife.  Housekeeping or managing your Housekeeping? N  Some recent data might be hidden    Patient Care Team: [name], MD as PCP - General (Family Medicine) Burnell Blanks, MD as PCP - Cardiology (Cardiology) Burnell Blanks, MD as Consulting Physician  (Cardiology) Gatha Mayer, MD as Consulting Physician (Gastroenterology) Druscilla Brownie, MD as Consulting Physician (Dermatology) Calvert Cantor, MD as Consulting Physician (Ophthalmology) Carolan Clines, MD as Consulting Physician (Urology) Gardiner Barefoot, DPM as Consulting Physician (Podiatry)   Assessment:   This is a routine wellness examination for [employer]. Physical assessment deferred to PCP.  Exercise Activities and Dietary recommendations Current Exercise Habits: Home exercise routine, Type of exercise: walking, Time (Minutes): 30, Frequency (Times/Week): 3, Weekly Exercise (Minutes/Week): 90, Intensity: Mild, Exercise limited by: None identified   Diet (meal preparation, eat out, water intake, caffeinated beverages, dairy products, fruits and vegetables): well balanced, on average, 3 meals per day Breakfast: egg, grits, [place] sausage, juice and coffee Lunch: snack Dinner: baked chicken, green beans, potatoes. Pt agrees he needs to drink more water and cut out Cokes.  Goals    . Increase water intake    . Weight (lb) < 300 lb (136.1 kg)       Fall Risk Fall Risk  07/11/2018 08/09/2017 08/01/2016 04/22/2015 04/20/2014  Falls in the past year? 1 No No No No  Comment Emmi Telephone Survey: data to providers prior to load - - - -  Number falls in past yr: 1 - - - -  Comment Emmi Telephone Survey Actual Response = 1 - - - -  Injury with Fall? 1 - - - -   Depression Screen PHQ 2/9 Scores 08/09/2017 08/01/2016 04/22/2015 04/20/2014  PHQ - 2 Score 0 0 0 0    Cognitive Function Ad8 score reviewed for issues:  Issues making decisions:no  Less interest in hobbies / activities:no  Repeats questions, stories (family complaining):no  Trouble using ordinary gadgets (microwave, computer, phone):no  Forgets the month or year: no  Mismanaging finances: no  Remembering appts:no  Daily problems with thinking and/or memory:no Ad8 score is=0   MMSE - Mini Mental  State Exam 08/09/2017 08/01/2016  Orientation to time 5 5  Orientation to Place 4 5  Registration 3 3  Attention/ Calculation 5 3  Recall 2 2  Language- name 2 objects 2 2  Language- repeat 1 1  Language- follow 3 step command 3 3  Language- read & follow direction 1 1  Write a sentence 1 1  Copy design 1 1  Total score 28 27        Immunization History  Administered Date(s) Administered  . Influenza Split 05/15/2011, 05/22/2012  . Influenza Whole 06/07/2009, 05/11/2010  . Influenza, High Dose Seasonal PF 04/22/2015, 05/07/2017, 05/14/2018  . Influenza,inj,Quad PF,6+ Mos 05/09/2013, 04/20/2014  . Influenza-Unspecified 05/17/2016  . Pneumococcal Conjugate-13 05/09/2013  . Pneumococcal Polysaccharide-23 04/16/2006, 04/22/2015  . Td 04/14/2008   Screening Tests Health Maintenance  Topic Date Due  . TETANUS/TDAP  04/14/2018  . INFLUENZA VACCINE  Completed  . PNA vac Low Risk Adult  Completed      Plan:    Please schedule your next medicare wellness visit with me in 1 yr.  Continue to eat heart healthy diet (full of fruits, vegetables, whole grains, lean protein, water--limit salt,  fat, and sugar intake) and increase physical activity as tolerated.  Continue doing brain stimulating activities (puzzles, reading, adult coloring books, staying active) to keep memory sharp.    I have personally reviewed and noted the following in the patient's chart:   . Medical and social history . Use of alcohol, tobacco or illicit drugs  . Current medications and supplements . Functional ability and status . Nutritional status . Physical activity . Advanced directives . List of other physicians . Hospitalizations, surgeries, and ER visits in previous 12 months . Vitals . Screenings to include cognitive, depression, and falls . Referrals and appointments  In addition, I have reviewed and discussed with patient certain preventive protocols, quality metrics, and best practice  recommendations. A written personalized care plan for preventive services as well as general preventive health recommendations were provided to patient.     Shela Nevin, [place]  08/12/2018  Medical screening examination/treatment was performed by qualified clinical staff member and as supervising physician I was immediately available for consultation/collaboration. I have reviewed documentation and agree with assessment and plan.  [name], MD

## 2018-08-12 ENCOUNTER — Other Ambulatory Visit (INDEPENDENT_AMBULATORY_CARE_PROVIDER_SITE_OTHER): Payer: Medicare Other

## 2018-08-12 ENCOUNTER — Ambulatory Visit (INDEPENDENT_AMBULATORY_CARE_PROVIDER_SITE_OTHER): Payer: Medicare Other | Admitting: *Deleted

## 2018-08-12 ENCOUNTER — Encounter: Payer: Self-pay | Admitting: *Deleted

## 2018-08-12 VITALS — BP 140/82 | HR 70 | Ht 72.0 in | Wt 335.2 lb

## 2018-08-12 DIAGNOSIS — Z0000 Encounter for general adult medical examination without abnormal findings: Secondary | ICD-10-CM | POA: Diagnosis not present

## 2018-08-12 DIAGNOSIS — E876 Hypokalemia: Secondary | ICD-10-CM

## 2018-08-12 LAB — COMPREHENSIVE METABOLIC PANEL
ALBUMIN: 3.9 g/dL (ref 3.5–5.2)
ALK PHOS: 61 U/L (ref 39–117)
ALT: 12 U/L (ref 0–53)
AST: 15 U/L (ref 0–37)
BUN: 12 mg/dL (ref 6–23)
CO2: 32 mEq/L (ref 19–32)
Calcium: 9.4 mg/dL (ref 8.4–10.5)
Chloride: 103 mEq/L (ref 96–112)
Creatinine, Ser: 1.01 mg/dL (ref 0.40–1.50)
GFR: 92.25 mL/min (ref >60.00)
Glucose, Bld: 137 mg/dL — ABNORMAL HIGH (ref 70–99)
Potassium: 3.9 mEq/L (ref 3.5–5.1)
Sodium: 142 mEq/L (ref 135–145)
Total Bilirubin: 1.3 mg/dL — ABNORMAL HIGH (ref 0.2–1.2)
Total Protein: 6.6 g/dL (ref 6.0–8.3)

## 2018-08-12 NOTE — Patient Instructions (Signed)
Please schedule your next medicare wellness visit with me in 1 yr.  Continue to eat heart healthy diet (full of fruits, vegetables, whole grains, lean protein, water--limit salt, fat, and sugar intake) and increase physical activity as tolerated.  Continue doing brain stimulating activities (puzzles, reading, adult coloring books, staying active) to keep memory sharp.    David Parsons , Thank you for taking time to come for your Medicare Wellness Visit. I appreciate your ongoing commitment to your health goals. Please review the following plan we discussed and let me know if I can assist you in the future.   These are the goals we discussed: Goals    . Increase water intake    . Weight (lb) < 300 lb (136.1 kg)       This is a list of the screening recommended for you and due dates:  Health Maintenance  Topic Date Due  . Tetanus Vaccine  04/14/2018  . Flu Shot  Completed  . Pneumonia vaccines  Completed    Health Maintenance After Age 45 After age 69, you are at a higher risk for certain long-term diseases and infections as well as injuries from falls. Falls are a major cause of broken bones and head injuries in people who are older than age 35. Getting regular preventive care can help to keep you healthy and well. Preventive care includes getting regular testing and making lifestyle changes as recommended by your health care provider. Talk with your health care provider about:  Which screenings and tests you should have. A screening is a test that checks for a disease when you have no symptoms.  A diet and exercise plan that is right for you. What should I know about screenings and tests to prevent falls? Screening and testing are the best ways to find a health problem early. Early diagnosis and treatment give you the best chance of managing medical conditions that are common after age 80. Certain conditions and lifestyle choices may make you more likely to have a fall. Your health care  provider may recommend:  Regular vision checks. Poor vision and conditions such as cataracts can make you more likely to have a fall. If you wear glasses, make sure to get your prescription updated if your vision changes.  Medicine review. Work with your health care provider to regularly review all of the medicines you are taking, including over-the-counter medicines. Ask your health care provider about any side effects that may make you more likely to have a fall. Tell your health care provider if any medicines that you take make you feel dizzy or sleepy.  Osteoporosis screening. Osteoporosis is a condition that causes the bones to get weaker. This can make the bones weak and cause them to break more easily.  Blood pressure screening. Blood pressure changes and medicines to control blood pressure can make you feel dizzy.  Strength and balance checks. Your health care provider may recommend certain tests to check your strength and balance while standing, walking, or changing positions.  Foot health exam. Foot pain and numbness, as well as not wearing proper footwear, can make you more likely to have a fall.  Depression screening. You may be more likely to have a fall if you have a fear of falling, feel emotionally low, or feel unable to do activities that you used to do.  Alcohol use screening. Using too much alcohol can affect your balance and may make you more likely to have a fall. What actions can  I take to lower my risk of falls? General instructions  Talk with your health care provider about your risks for falling. Tell your health care provider if: ? You fall. Be sure to tell your health care provider about all falls, even ones that seem minor. ? You feel dizzy, sleepy, or off-balance.  Take over-the-counter and prescription medicines only as told by your health care provider. These include any supplements.  Eat a healthy diet and maintain a healthy weight. A healthy diet includes  low-fat dairy products, low-fat (lean) meats, and fiber from whole grains, beans, and lots of fruits and vegetables. Home safety  Remove any tripping hazards, such as rugs, cords, and clutter.  Install safety equipment such as grab bars in bathrooms and safety rails on stairs.  Keep rooms and walkways well-lit. Activity   Follow a regular exercise program to stay fit. This will help you maintain your balance. Ask your health care provider what types of exercise are appropriate for you.  If you need a cane or walker, use it as recommended by your health care provider.  Wear supportive shoes that have nonskid soles. Lifestyle  Do not drink alcohol if your health care provider tells you not to drink.  If you drink alcohol, limit how much you have: ? 0-1 drink a day for women. ? 0-2 drinks a day for men.  Be aware of how much alcohol is in your drink. In the [place] one drink equals one typical bottle of beer (12 oz), one-half glass of wine (5 oz), or one shot of hard liquor (1 oz).  Do not use any products that contain nicotine or tobacco, such as cigarettes and e-cigarettes. If you need help quitting, ask your health care provider. Summary  Having a healthy lifestyle and getting preventive care can help to protect your health and wellness after age 58.  Screening and testing are the best way to find a health problem early and help you avoid having a fall. Early diagnosis and treatment give you the best chance for managing medical conditions that are more common for people who are older than age 49.  Falls are a major cause of broken bones and head injuries in people who are older than age 31. Take precautions to prevent a fall at home.  Work with your health care provider to learn what changes you can make to improve your health and wellness and to prevent falls. This information is not intended to replace advice given to you by your health care provider. Make sure you discuss any  questions you have with your health care provider. Document Released: 06/20/2017 Document Revised: 06/20/2017 Document Reviewed: 06/20/2017 Elsevier Interactive Patient Education  2019 [employer].

## 2018-08-19 ENCOUNTER — Other Ambulatory Visit: Payer: Self-pay | Admitting: Family Medicine

## 2018-09-05 ENCOUNTER — Ambulatory Visit: Payer: Medicare Other | Admitting: Podiatry

## 2018-09-11 ENCOUNTER — Ambulatory Visit (INDEPENDENT_AMBULATORY_CARE_PROVIDER_SITE_OTHER): Payer: Medicare Other | Admitting: Podiatry

## 2018-09-11 ENCOUNTER — Encounter: Payer: Self-pay | Admitting: Podiatry

## 2018-09-11 DIAGNOSIS — B351 Tinea unguium: Secondary | ICD-10-CM

## 2018-09-11 DIAGNOSIS — M79674 Pain in right toe(s): Secondary | ICD-10-CM | POA: Diagnosis not present

## 2018-09-11 DIAGNOSIS — M79675 Pain in left toe(s): Secondary | ICD-10-CM

## 2018-09-11 DIAGNOSIS — B353 Tinea pedis: Secondary | ICD-10-CM

## 2018-09-11 MED ORDER — CICLOPIROX OLAMINE 0.77 % EX CREA: TOPICAL_CREAM | CUTANEOUS | 1 refills | Status: DC

## 2018-09-11 NOTE — Patient Instructions (Addendum)
Onychomycosis/Fungal Toenails  WHAT IS IT? An infection that lies within the keratin of your nail plate that is caused by a fungus.  WHY ME? Fungal infections affect all ages, sexes, races, and creeds.  There may be many factors that predispose you to a fungal infection such as age, coexisting medical conditions such as diabetes, or an autoimmune disease; stress, medications, fatigue, genetics, etc.  Bottom line: fungus thrives in a warm, moist environment and your shoes offer such a location.  IS IT CONTAGIOUS? Theoretically, yes.  You do not want to share shoes, nail clippers or files with someone who has fungal toenails.  Walking around barefoot in the same room or sleeping in the same bed is unlikely to transfer the organism.  It is important to realize, however, that fungus can spread easily from one nail to the next on the same foot.  HOW DO WE TREAT THIS?  There are several ways to treat this condition.  Treatment may depend on many factors such as age, medications, pregnancy, liver and kidney conditions, etc.  It is best to ask your doctor which options are available to you.  1. No treatment.   Unlike many other medical concerns, you can live with this condition.  However for many people this can be a painful condition and may lead to ingrown toenails or a bacterial infection.  It is recommended that you keep the nails cut short to help reduce the amount of fungal nail. 2. Topical treatment.  These range from herbal remedies to prescription strength nail lacquers.  About 40-50% effective, topicals require twice daily application for approximately 9 to 12 months or until an entirely new nail has grown out.  The most effective topicals are medical grade medications available through physicians offices. 3. Oral antifungal medications.  With an 80-90% cure rate, the most common oral medication requires 3 to 4 months of therapy and stays in your system for a year as the new nail grows out.  Oral  antifungal medications do require blood work to make sure it is a safe drug for you.  A liver function panel will be performed prior to starting the medication and after the first month of treatment.  It is important to have the blood work performed to avoid any harmful side effects.  In general, this medication safe but blood work is required. 4. Laser Therapy.  This treatment is performed by applying a specialized laser to the affected nail plate.  This therapy is noninvasive, fast, and non-painful.  It is not covered by insurance and is therefore, out of pocket.  The results have been very good with a 80-95% cure rate.  [facility] is the only practice in the area to offer this therapy. 5. Permanent Nail Avulsion.  Removing the entire nail so that a new nail will not grow back.  Athlete's Foot  Athlete's foot (tinea pedis) is a fungal infection of the skin on your feet. It often occurs on the skin that is between or underneath your toes. It can also occur on the soles of your feet. Symptoms include itchy or white and flaky areas on the skin. The infection can spread from person to person (is contagious). It can also spread when a person's bare feet come in contact with the fungus on shower floors or on items such as shoes. Follow these instructions at home: Medicines  Apply or take over-the-counter and prescription medicines only as told by your doctor.  Apply your antifungal  medicine as told by your doctor. Do not stop using the medicine even if your feet start to get better. Foot care  Do not scratch your feet.  Keep your feet dry: ? Wear cotton or wool socks. Change your socks every day or if they become wet. ? Wear shoes that allow air to move around, such as sandals or canvas tennis shoes.  Wash and dry your feet: ? Every day or as told by your doctor. ? After exercising. ? Including the area between your toes. General instructions  Do not share any of these items that  touch your feet: ? Towels. ? Shoes. ? Nail clippers. ? Other personal items.  Protect your feet by wearing sandals in wet areas, such as locker rooms and shared showers.  Keep all follow-up visits as told by your doctor. This is important.  If you have diabetes, keep your blood sugar under control. Contact a doctor if:  You have a fever.  You have swelling, pain, warmth, or redness in your foot.  Your feet are not getting better with treatment.  Your symptoms get worse.  You have new symptoms. Summary  Athlete's foot is a fungal infection of the skin on your feet.  Symptoms include itchy or white and flaky areas on the skin.  Apply your antifungal medicine as told by your doctor.  Keep your feet clean and dry. This information is not intended to replace advice given to you by your health care provider. Make sure you discuss any questions you have with your health care provider. Document Released: 01/24/2008 Document Revised: 05/28/2017 Document Reviewed: 05/28/2017 Elsevier Interactive Patient Education  2019 [employer].

## 2018-09-12 ENCOUNTER — Encounter: Payer: Self-pay | Admitting: Podiatry

## 2018-09-12 NOTE — Progress Notes (Signed)
Subjective: [name] presents today for follow up painful, mycotic toenails which respond to periodic debridement.   He is requesting refill of Ciclopirox Cream on today. He states his wife is now assisting him in applying medication.  Patient states he and his wife are caring for his 77 y.o. sister with cancer and things have been a little stressful.    Objective: Vascular Examination: Capillary refill time immediate x 10 digits Dorsalis pedis and posterior tibial pulses present b/l No digital hair x 10 digits Skin temperature WNL b/l  Dermatological Examination: Skin turgor WNL b/l  Toenails 1-5 b/l discolored, thick, dystrophic with subungual debris and pain with palpation to nailbeds due to thickness of nails.  +Diffuse scaling noted plantarly and peripherally, improved. Resolved foot odor.  Musculoskeletal: Muscle strength 5/5 to all LE muscle groups  Neurological: Sensation intact with 10 gram monofilament. Vibratory sensation intact.  Assessment: 1. Painful onychomycosis toenails 1-5 b/l  2. Tinea pedis b/l  Plan: 1. Toenails 1-5 b/l were debrided in length and girth without iatrogenic bleeding. 2. Refilled Ciclopirox Cream 0.77% to be applied to both feet bid x 4 weeks for tinea pedis 3. Patient to continue soft, supportive shoe gear 4. Patient to report any pedal injuries to medical professional immediately. 5. Follow up 3 months. Patient/POA to call should there be a concern in the interim.

## 2018-09-13 ENCOUNTER — Other Ambulatory Visit: Payer: Self-pay | Admitting: Family Medicine

## 2018-09-13 MED ORDER — POTASSIUM CHLORIDE CRYS ER 20 MEQ PO TBCR: 60.0000 meq | EXTENDED_RELEASE_TABLET | Freq: Three times a day (TID) | ORAL | 0 refills | Status: DC

## 2018-09-13 NOTE — Telephone Encounter (Signed)
Copied from [place] [id number]. Topic: Quick Communication - Rx Refill/Question >> Sep 13, 2018  3:32 PM Margot Ables wrote: Medication: potassium chloride SA (KLOR-CON M20) 20 MEQ tablet - pt contacted the pharmacy today and was told pt to call the doctors office - pt has 2 days left   Has the patient contacted their pharmacy? yes Preferred Pharmacy (with phone number or street name): [id number] - [place], [place] - [place] [phone] (Phone) [phone] (Fax)

## 2018-09-14 ENCOUNTER — Other Ambulatory Visit: Payer: Self-pay | Admitting: Family Medicine

## 2018-10-03 DIAGNOSIS — H35371 Puckering of macula, right eye: Secondary | ICD-10-CM | POA: Diagnosis not present

## 2018-10-03 DIAGNOSIS — H524 Presbyopia: Secondary | ICD-10-CM | POA: Diagnosis not present

## 2018-10-03 DIAGNOSIS — H401132 Primary open-angle glaucoma, bilateral, moderate stage: Secondary | ICD-10-CM | POA: Diagnosis not present

## 2018-10-03 DIAGNOSIS — H04123 Dry eye syndrome of bilateral lacrimal glands: Secondary | ICD-10-CM | POA: Diagnosis not present

## 2018-10-03 DIAGNOSIS — H25813 Combined forms of age-related cataract, bilateral: Secondary | ICD-10-CM | POA: Diagnosis not present

## 2018-10-13 ENCOUNTER — Other Ambulatory Visit: Payer: Self-pay | Admitting: Family Medicine

## 2018-11-06 ENCOUNTER — Other Ambulatory Visit: Payer: Self-pay | Admitting: Family Medicine

## 2018-11-07 ENCOUNTER — Other Ambulatory Visit: Payer: Self-pay | Admitting: Family Medicine

## 2018-11-14 ENCOUNTER — Ambulatory Visit: Payer: Medicare Other | Admitting: Family Medicine

## 2018-11-15 ENCOUNTER — Ambulatory Visit: Payer: Medicare Other | Admitting: Family Medicine

## 2018-11-20 ENCOUNTER — Other Ambulatory Visit: Payer: Self-pay | Admitting: Cardiovascular Disease

## 2018-11-20 ENCOUNTER — Other Ambulatory Visit: Payer: Self-pay | Admitting: Family Medicine

## 2018-11-21 ENCOUNTER — Other Ambulatory Visit: Payer: Self-pay | Admitting: Family Medicine

## 2018-12-11 ENCOUNTER — Encounter: Payer: Self-pay | Admitting: Podiatry

## 2018-12-11 ENCOUNTER — Ambulatory Visit (INDEPENDENT_AMBULATORY_CARE_PROVIDER_SITE_OTHER): Payer: Medicare Other | Admitting: Podiatry

## 2018-12-11 ENCOUNTER — Other Ambulatory Visit: Payer: Self-pay

## 2018-12-11 VITALS — Temp 97.7°F

## 2018-12-11 DIAGNOSIS — B351 Tinea unguium: Secondary | ICD-10-CM | POA: Diagnosis not present

## 2018-12-11 DIAGNOSIS — M79674 Pain in right toe(s): Secondary | ICD-10-CM | POA: Diagnosis not present

## 2018-12-11 DIAGNOSIS — M79675 Pain in left toe(s): Secondary | ICD-10-CM

## 2018-12-11 NOTE — Patient Instructions (Signed)
Onychomycosis/Fungal Toenails  WHAT IS IT? An infection that lies within the keratin of your nail plate that is caused by a fungus.  WHY ME? Fungal infections affect all ages, sexes, races, and creeds.  There may be many factors that predispose you to a fungal infection such as age, coexisting medical conditions such as diabetes, or an autoimmune disease; stress, medications, fatigue, genetics, etc.  Bottom line: fungus thrives in a warm, moist environment and your shoes offer such a location.  IS IT CONTAGIOUS? Theoretically, yes.  You do not want to share shoes, nail clippers or files with someone who has fungal toenails.  Walking around barefoot in the same room or sleeping in the same bed is unlikely to transfer the organism.  It is important to realize, however, that fungus can spread easily from one nail to the next on the same foot.  HOW DO WE TREAT THIS?  There are several ways to treat this condition.  Treatment may depend on many factors such as age, medications, pregnancy, liver and kidney conditions, etc.  It is best to ask your doctor which options are available to you.  1. No treatment.   Unlike many other medical concerns, you can live with this condition.  However for many people this can be a painful condition and may lead to ingrown toenails or a bacterial infection.  It is recommended that you keep the nails cut short to help reduce the amount of fungal nail. 2. Topical treatment.  These range from herbal remedies to prescription strength nail lacquers.  About 40-50% effective, topicals require twice daily application for approximately 9 to 12 months or until an entirely new nail has grown out.  The most effective topicals are medical grade medications available through physicians offices. 3. Oral antifungal medications.  With an 80-90% cure rate, the most common oral medication requires 3 to 4 months of therapy and stays in your system for a year as the new nail grows out.  Oral  antifungal medications do require blood work to make sure it is a safe drug for you.  A liver function panel will be performed prior to starting the medication and after the first month of treatment.  It is important to have the blood work performed to avoid any harmful side effects.  In general, this medication safe but blood work is required. 4. Laser Therapy.  This treatment is performed by applying a specialized laser to the affected nail plate.  This therapy is noninvasive, fast, and non-painful.  It is not covered by insurance and is therefore, out of pocket.  The results have been very good with a 80-95% cure rate.  [facility] is the only practice in the area to offer this therapy. 5. Permanent Nail Avulsion.  Removing the entire nail so that a new nail will not grow back.  Corns and Calluses Corns are small areas of thickened skin that occur on the top, sides, or tip of a toe. They contain a cone-shaped core with a point that can press on a nerve below. This causes pain.  Calluses are areas of thickened skin that can occur anywhere on the body, including the hands, fingers, palms, soles of the feet, and heels. Calluses are usually larger than corns. What are the causes? Corns and calluses are caused by rubbing (friction) or pressure, such as from shoes that are too tight or do not fit properly. What increases the risk? Corns are more likely to develop in people   who have misshapen toes (toe deformities), such as hammer toes. Calluses can occur with friction to any area of the skin. They are more likely to develop in people who:  Work with their hands.  Wear shoes that fit poorly, are too tight, or are high-heeled.  Have toe deformities. What are the signs or symptoms? Symptoms of a corn or callus include:  A hard growth on the skin.  Pain or tenderness under the skin.  Redness and swelling.  Increased discomfort while wearing tight-fitting shoes, if your feet are  affected. If a corn or callus becomes infected, symptoms may include:  Redness and swelling that gets worse.  Pain.  Fluid, blood, or pus draining from the corn or callus. How is this diagnosed? Corns and calluses may be diagnosed based on your symptoms, your medical history, and a physical exam. How is this treated? Treatment for corns and calluses may include:  Removing the cause of the friction or pressure. This may involve: ? Changing your shoes. ? Wearing shoe inserts (orthotics) or other protective layers in your shoes, such as a corn pad. ? Wearing gloves.  Applying medicine to the skin (topical medicine) to help soften skin in the hardened, thickened areas.  Removing layers of dead skin with a file to reduce the size of the corn or callus.  Removing the corn or callus with a scalpel or laser.  Taking antibiotic medicines, if your corn or callus is infected.  Having surgery, if a toe deformity is the cause. Follow these instructions at home:   Take over-the-counter and prescription medicines only as told by your health care provider.  If you were prescribed an antibiotic, take it as told by your health care provider. Do not stop taking it even if your condition starts to improve.  Wear shoes that fit well. Avoid wearing high-heeled shoes and shoes that are too tight or too loose.  Wear any padding, protective layers, gloves, or orthotics as told by your health care provider.  Soak your hands or feet and then use a file or pumice stone to soften your corn or callus. Do this as told by your health care provider.  Check your corn or callus every day for symptoms of infection. Contact a health care provider if you:  Notice that your symptoms do not improve with treatment.  Have redness or swelling that gets worse.  Notice that your corn or callus becomes painful.  Have fluid, blood, or pus coming from your corn or callus.  Have new symptoms. Summary  Corns are  small areas of thickened skin that occur on the top, sides, or tip of a toe.  Calluses are areas of thickened skin that can occur anywhere on the body, including the hands, fingers, palms, and soles of the feet. Calluses are usually larger than corns.  Corns and calluses are caused by rubbing (friction) or pressure, such as from shoes that are too tight or do not fit properly.  Treatment may include wearing any padding, protective layers, gloves, or orthotics as told by your health care provider. This information is not intended to replace advice given to you by your health care provider. Make sure you discuss any questions you have with your health care provider. Document Released: 05/13/2004 Document Revised: 06/20/2017 Document Reviewed: 06/20/2017 Elsevier Interactive Patient Education  2019 [employer]  

## 2018-12-11 NOTE — Progress Notes (Signed)
Subjective: David Parsons presents today with painful, thick toenails 1-5 b/l that he cannot cut and which interfere with daily activities.  Pain is aggravated when wearing enclosed shoe gear and relieved with periodic professional debridement.  [name], MD is his PCP and last visit was 06/07/2018.   Current Outpatient Medications:  .  amLODipine (NORVASC) 10 MG tablet, TAKE 1 TABLET BY MOUTH EVERY DAY, Disp: 90 tablet, Rfl: 0 .  aspirin EC 81 MG tablet, Take 81 mg by mouth daily., Disp: , Rfl:  .  augmented betamethasone dipropionate (DIPROLENE-AF) 0.05 % cream, Apply topically 2 (two) times daily., Disp: 15 g, Rfl: 0 .  azelastine (ASTELIN) 0.1 % nasal spray, Place 2 sprays into both nostrils 2 (two) times daily. Use in each nostril as directed, Disp: 30 mL, Rfl: 2 .  carvedilol (COREG) 25 MG tablet, TAKE 1 TABLET BY MOUTH 2 TIMES DAILY WITH A MEAL., Disp: 180 tablet, Rfl: 0 .  ciclopirox (LOPROX) 0.77 % cream, Apply to both feet and between toes twice daily for 4 weeks., Disp: 30 g, Rfl: 1 .  clobetasol ointment (TEMOVATE) 0.05 %, apply to affected area twice a day UPON ITCHING AND IRRITATION(NOT TO FACE OR GROIN), Disp: , Rfl: 0 .  diclofenac sodium (VOLTAREN) 1 % GEL, Apply 4 g topically 4 (four) times daily. To affected joint., Disp: 100 g, Rfl: 11 .  ferrous sulfate 325 (65 FE) MG tablet, TAKE 1 TABLET BY MOUTH 2 (TWO) TIMES DAILY BEFORE A MEAL., Disp: 90 tablet, Rfl: 1 .  fluticasone (FLONASE) 50 MCG/ACT nasal spray, Place 2 sprays into both nostrils daily., Disp: 16 g, Rfl: 1 .  furosemide (LASIX) 40 MG tablet, Take 1 tablet (40 mg total) by mouth 2 (two) times daily., Disp: 60 tablet, Rfl: 12 .  hydrocortisone 2.5 % cream, Apply 1 application topically 2 (two) times daily. , Disp: , Rfl: 0 .  KLOR-CON M20 20 MEQ tablet, TAKE 3 TABLETS (60 MEQ TOTAL) BY MOUTH 3 (THREE) TIMES DAILY., Disp: 810 tablet, Rfl: 1 .  levocetirizine (XYZAL) 5 MG tablet, TAKE 1 TABLET EVERY EVENING,  Disp: 30 tablet, Rfl: 0 .  losartan (COZAAR) 100 MG tablet, TAKE 1 TABLET (100 MG TOTAL) BY MOUTH DAILY. PLEASE KEEP UPCOMING APPT FOR FUTURE REFILLS. THANK YOU, Disp: 90 tablet, Rfl: 2 .  Multiple Vitamin (MULITIVITAMIN WITH MINERALS) TABS, Take 1 tablet by mouth daily., Disp: , Rfl:  .  omeprazole (PRILOSEC) 40 MG capsule, Take 1 capsule (40 mg total) by mouth daily., Disp: 90 capsule, Rfl: 3 .  timolol (TIMOPTIC) 0.5 % ophthalmic solution, INSTILL 1 DROP INTO BOTH EYES TWICE A DAY, Disp: , Rfl:  .  triamterene-hydrochlorothiazide (MAXZIDE-25) 37.5-25 MG tablet, TAKE 1 TABLET BY MOUTH EVERY DAY, Disp: 90 tablet, Rfl: 0  Allergies  Allergen Reactions  . Latex Hives    Latex gloves     Objective:  Vascular Examination: Capillary refill time immediate x 10 digits  Dorsalis pedis and Posterior tibial pulses palpable b/l  Digital hair absent x 10 digits  Skin temperature gradient WNL b/l  Dermatological Examination: Skin dry with normal turgor and tone b/l.   Toenails 1-5 b/l discolored, thick, dystrophic with subungual debris and pain with palpation to nailbeds due to thickness of nails.  Musculoskeletal: Muscle strength 5/5 to all LE muscle groups  No gross bony deformities b/l.  No pain, crepitus or joint limitation noted with ROM.   Neurological: Sensation intact with 10 gram monofilament.  Vibratory sensation  intact.  Assessment: Painful onychomycosis toenails 1-5 b/l   Plan: 1. Toenails 1-5 b/l were debrided in length and girth without iatrogenic bleeding. 2. Patient to continue soft, supportive shoe gear daily. 3. Patient to report any pedal injuries to medical professional immediately. 4. Follow up 3 months.  5. Patient/POA to call should there be a concern in the interim.

## 2018-12-13 ENCOUNTER — Other Ambulatory Visit: Payer: Self-pay | Admitting: Family Medicine

## 2019-01-03 ENCOUNTER — Other Ambulatory Visit: Payer: Self-pay | Admitting: Family Medicine

## 2019-01-09 ENCOUNTER — Other Ambulatory Visit: Payer: Self-pay | Admitting: Family Medicine

## 2019-01-09 ENCOUNTER — Ambulatory Visit (INDEPENDENT_AMBULATORY_CARE_PROVIDER_SITE_OTHER): Payer: Medicare Other | Admitting: Family Medicine

## 2019-01-09 ENCOUNTER — Other Ambulatory Visit: Payer: Self-pay

## 2019-01-09 DIAGNOSIS — I10 Essential (primary) hypertension: Secondary | ICD-10-CM

## 2019-01-09 DIAGNOSIS — I509 Heart failure, unspecified: Secondary | ICD-10-CM

## 2019-01-09 DIAGNOSIS — R739 Hyperglycemia, unspecified: Secondary | ICD-10-CM | POA: Diagnosis not present

## 2019-01-13 NOTE — Assessment & Plan Note (Signed)
Asymptomatic minimize sodium and attempt modest weight loss.

## 2019-01-13 NOTE — Assessment & Plan Note (Signed)
Encouraged DASH diet, decrease po intake and increase exercise as tolerated. Needs 7-8 hours of sleep nightly. Avoid trans fats, eat small, frequent meals every 4-5 hours with lean proteins, complex carbs and healthy fats. Minimize simple carbs, consider weight watchers APP

## 2019-01-13 NOTE — Assessment & Plan Note (Signed)
hgba1c acceptable, minimize simple carbs. Increase exercise as tolerated.  

## 2019-01-13 NOTE — Progress Notes (Signed)
Virtual Visit via Telephone Note  I connected with David Parsons on 01/13/19 at  3:00 PM EDT by a telephone enabled telemedicine application and verified that I am speaking with the correct person using two identifiers.  Location: Patient: home Provider: office   I discussed the limitations of evaluation and management by telemedicine and the availability of in person appointments. The patient expressed understanding and agreed to proceed. David Parsons CMA attempted to get the patient set up on a video visit but was unsuccessful so visit was completed via Telephone.     Subjective:    Patient ID: David Parsons, male    DOB: [birth year], 78 y.o.   MRN: [id number]  No chief complaint on file.   HPI Patient is in today for follow up on hypertension, obesity, congestive heart failure and more. He feels well. No recent febrile illness or hospitalizations. Is trying to stay active and is maintaining his quarantine well. He is at home with his wife and they are trying to maintain a heart healthy diet. Denies CP/palp/SOB/HA/congestion/fevers/GI or GU c/o. Taking meds as prescribed  Past Medical History:  Diagnosis Date  . Aortic insufficiency     02/16/09 Chest CT:  The aortic root has a diameter measuring 4.6 cm, image 64 of the coronal series.  The ascending    thoracic aorta has a diameter of 3.8 cm, image 65 of the coronal series.  At the level of the aortic arch    the thoracic aorta has a maximum diameter of 3.2 cm.  The descending thoracic aorta has a maximum           diameter of 3.2 cm.  There is no evidence for aortic disse        . Aortic root enlargement (HCC)    The aortic root has a diameter measuring 4.6 cm, image 64 of the coronal series.  The ascending  thoracic aorta has a diameter of 3.8 cm, image 65 of the coronal series.  At the level of the aortic arch   the thoracic aorta has a maximum diameter of 3.2 cm.  The descending thoracic aorta has a maximum   diameter of 3.2 cm.   There is no evidence for aortic dissection.       Marland Kitchen CAD (coronary artery disease)   . Cardiomyopathy     01/2009 - Cath - nonobs. dzs.  EF 35%.  . CHF (congestive heart failure) ([place])   . Diabetes mellitus   . Dyslipidemia 01/14/2013  . Gallstones    s/p cholecystectomy    . GERD (gastroesophageal reflux disease)   . Glaucoma 10/05/2016  . Heart murmur   . Helicobacter pylori gastritis    (Tx Pylera 4/10)  . Hypertension   . Hypokalemia   . Iron deficiency anemia   . Obesity, Class III, BMI 40-49.9 (morbid obesity) ([place])   . Onychomycosis 03/09/2015  . PONV (postoperative nausea and vomiting)   . 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 Sleep apnea    occasionally wears CPAP at night    Past Surgical History:  Procedure Laterality Date  . ADRENALECTOMY    . BENTALL PROCEDURE  12/28/2011   Procedure: BENTALL PROCEDURE;  Surgeon: Rexene Alberts, MD;  Location: [facility];  Service: Open Heart Surgery;  Laterality: N/A;  . CARDIAC CATHETERIZATION     2013  . CARDIAC VALVE REPLACEMENT  12/28/2011   Bentall with tissue valve  . CHOLECYSTECTOMY    . COLONOSCOPY  09/25/2011  . COLONOSCOPY W/ BIOPSIES AND POLYPECTOMY  11/04/2008   colon polyps, diverticulosis   . CORONARY ARTERY BYPASS GRAFT  12/28/2011   Procedure: CORONARY ARTERY BYPASS GRAFTING (CABG);  Surgeon: Rexene Alberts, MD;  Location: [facility];  Service: Open Heart Surgery;  Laterality: N/A;  cabg x 1  . LEFT AND RIGHT HEART CATHETERIZATION WITH CORONARY ANGIOGRAM N/A 11/22/2011   Procedure: LEFT AND RIGHT HEART CATHETERIZATION WITH CORONARY ANGIOGRAM;  Surgeon: Burnell Blanks, MD;  Location[facility];  Service: Cardiovascular;  Laterality: N/A;  . MULTIPLE TOOTH EXTRACTIONS    .  TEE WITHOUT CARDIOVERSION  11/22/2011   Procedure: TRANSESOPHAGEAL ECHOCARDIOGRAM (TEE);  Surgeon: Larey Dresser, MD;  Location: [facility];  Service: Cardiovascular;  Laterality: N/A;  . UPPER GASTROINTESTINAL ENDOSCOPY  11/04/2008   w/bx, H pylori gastritis  . UPPER GASTROINTESTINAL ENDOSCOPY  09/25/2011    Family History  Problem Relation Age of Onset  . Breast cancer Sister   . Diabetes Sister   . Colon cancer Brother 70       at least 40  . Diabetes Brother   . Cancer Brother        colon, prostate  . Prostate cancer Brother   . Diabetes Brother   . Diabetes Sister   . Cancer Sister        breast, colon, melanoma  . Heart disease Sister   . Hypertension Mother   . Arthritis Mother   . Kidney failure Sister   . Colon cancer Sister   . Breast cancer Sister   . Kidney disease Sister        s/p transplant  . Hypertension Sister   . Diabetes Sister   . Stroke Father   . Hypertension Father     Social History   Socioeconomic History  . Marital status: Married    Spouse name: David Parsons  . Number of children: 0  . Years of education: Not on file  . 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 Financial resource strain: Not on file  . Food insecurity:    Worry: Not on file    Inability: Not on file  . Transportation needs:    Medical: Not on file    Non-medical: Not on file  Tobacco Use  . Smoking status: Never Smoker  . Smokeless tobacco: Never Used  Substance and Sexual Activity  . Alcohol use: No  . Drug use: No  . Sexual activity: Not Currently    Comment: lives with wife, pastros 2 churches  Lifestyle  . Physical activity:    Days per week: Not on file    Minutes per session: Not on file  . Stress: Not on file  Relationships  . Social connections:    Talks on phone: Not on file    Gets together: Not on file    Attends religious service: Not on  file    Active member of club or organization: Not on file    Attends meetings of clubs or organizations: Not on file    Relationship status: Not on file  . Intimate partner violence:  Fear of current or ex partner: Not on file    Emotionally abused: Not on file    Physically abused: Not on file    Forced sexual activity: Not on file  Other Topics Concern  . Not on file  Social History Narrative   The patient is married, lives with his wife in  [place].  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  He  lives a very sedentary lifestyle.  He is a nonsmoker.  He denies alcohol consumption.     Outpatient Medications Prior to Visit  Medication Sig Dispense Refill  . amLODipine (NORVASC) 10 MG tablet TAKE 1 TABLET BY MOUTH EVERY DAY 90 tablet 0  . aspirin EC 81 MG tablet Take 81 mg by mouth daily.    Marland Kitchen augmented betamethasone dipropionate (DIPROLENE-AF) 0.05 % cream Apply topically 2 (two) times daily. 15 g 0  . azelastine (ASTELIN) 0.1 % nasal spray Place 2 sprays into both nostrils 2 (two) times daily. Use in each nostril as directed 30 mL 2  . carvedilol (COREG) 25 MG tablet TAKE 1 TABLET BY MOUTH 2 TIMES DAILY WITH A MEAL. 180 tablet 0  . ciclopirox (LOPROX) 0.77 % cream Apply to both feet and between toes twice daily for 4 weeks. 30 g 1  . clobetasol ointment (TEMOVATE) 0.05 % apply to affected area twice a day UPON ITCHING AND IRRITATION(NOT TO FACE OR GROIN)  0  . diclofenac sodium (VOLTAREN) 1 % GEL Apply 4 g topically 4 (four) times daily. To affected joint. 100 g 11  . ferrous sulfate 325 (65 FE) MG tablet TAKE 1 TABLET BY MOUTH 2 (TWO) TIMES DAILY BEFORE A MEAL. 90 tablet 1  . fluticasone (FLONASE) 50 MCG/ACT nasal spray Place 2 sprays into both nostrils daily. 16 g 1  . furosemide (LASIX) 40 MG tablet Take 1 tablet (40 mg total) by mouth 2 (two) times daily. 60 tablet 12  . hydrocortisone 2.5 % cream Apply 1 application topically 2 (two) times daily.   0  .  KLOR-CON M20 20 MEQ tablet TAKE 3 TABLETS (60 MEQ TOTAL) BY MOUTH 3 (THREE) TIMES DAILY. 810 tablet 1  . levocetirizine (XYZAL) 5 MG tablet TAKE 1 TABLET EVERY EVENING 30 tablet 0  . losartan (COZAAR) 100 MG tablet TAKE 1 TABLET (100 MG TOTAL) BY MOUTH DAILY. PLEASE KEEP UPCOMING APPT FOR FUTURE REFILLS. THANK YOU 90 tablet 2  . Multiple Vitamin (MULITIVITAMIN WITH MINERALS) TABS Take 1 tablet by mouth daily.    Marland Kitchen omeprazole (PRILOSEC) 40 MG capsule Take 1 capsule (40 mg total) by mouth daily. 90 capsule 3  . timolol (TIMOPTIC) 0.5 % ophthalmic solution INSTILL 1 DROP INTO BOTH EYES TWICE A DAY    . triamterene-hydrochlorothiazide (MAXZIDE-25) 37.5-25 MG tablet TAKE 1 TABLET BY MOUTH EVERY DAY 90 tablet 0   No facility-administered medications prior to visit.     Allergies  Allergen Reactions  . Latex Hives    Latex gloves     Review of Systems  Constitutional: Negative for fever and malaise/fatigue.  HENT: Negative for congestion.   Eyes: Negative for blurred vision.  Respiratory: Negative for shortness of breath.   Cardiovascular: Negative for chest pain, palpitations and leg swelling.  Gastrointestinal: Negative for abdominal pain, blood in stool and nausea.  Genitourinary: Negative for dysuria and frequency.  Musculoskeletal: Negative for falls.  Skin: Negative for rash.  Neurological: Negative for dizziness, loss of consciousness and headaches.  Endo/Heme/Allergies: Negative for environmental allergies.  Psychiatric/Behavioral: Negative for depression. The patient is not nervous/anxious.        Objective:    Physical Exam Unable to obtain via telephone BP 127/64  Wt Readings from Last 3 Encounters:  08/12/18 (!) 335 lb 3.2 oz (152 kg)  05/14/18 (!) 334 lb 12.8 oz (151.9 kg)  02/25/18 (!) 337 lb (152.9 kg)    Diabetic Foot Exam - Simple   No data filed     Lab Results  Component Value Date   WBC 8.8 05/14/2018   HGB 14.4 05/14/2018   HCT 43.1 05/14/2018   PLT  269.0 05/14/2018   GLUCOSE 137 (H) 08/12/2018   CHOL 146 05/14/2018   TRIG 111.0 05/14/2018   HDL 34.20 (L) 05/14/2018   LDLCALC 90 05/14/2018   ALT 12 08/12/2018   AST 15 08/12/2018   NA 142 08/12/2018   K 3.9 08/12/2018   CL 103 08/12/2018   CREATININE 1.01 08/12/2018   BUN 12 08/12/2018   CO2 32 08/12/2018   TSH 1.89 05/14/2018   PSA 7.32 (H) 09/07/2010   INR 1.21 01/02/2012   HGBA1C 5.8 05/14/2018    Lab Results  Component Value Date   TSH 1.89 05/14/2018   Lab Results  Component Value Date   WBC 8.8 05/14/2018   HGB 14.4 05/14/2018   HCT 43.1 05/14/2018   MCV 87.7 05/14/2018   PLT 269.0 05/14/2018   Lab Results  Component Value Date   NA 142 08/12/2018   K 3.9 08/12/2018   CO2 32 08/12/2018   GLUCOSE 137 (H) 08/12/2018   BUN 12 08/12/2018   CREATININE 1.01 08/12/2018   BILITOT 1.3 (H) 08/12/2018   ALKPHOS 61 08/12/2018   AST 15 08/12/2018   ALT 12 08/12/2018   PROT 6.6 08/12/2018   ALBUMIN 3.9 08/12/2018   CALCIUM 9.4 08/12/2018   GFR 92.25 08/12/2018   Lab Results  Component Value Date   CHOL 146 05/14/2018   Lab Results  Component Value Date   HDL 34.20 (L) 05/14/2018   Lab Results  Component Value Date   LDLCALC 90 05/14/2018   Lab Results  Component Value Date   TRIG 111.0 05/14/2018   Lab Results  Component Value Date   CHOLHDL 4 05/14/2018   Lab Results  Component Value Date   HGBA1C 5.8 05/14/2018       Assessment & Plan:   Problem List Items Addressed This Visit    Obesity, Class III, BMI 40-49.9 (morbid obesity) ([place])    Encouraged DASH diet, decrease po intake and increase exercise as tolerated. Needs 7-8 hours of sleep nightly. Avoid trans fats, eat small, frequent meals every 4-5 hours with lean proteins, complex carbs and healthy fats. Minimize simple carbs, consider weight watchers APP      Essential hypertension    Well controlled, no changes to meds. Encouraged heart healthy diet such as the DASH diet and exercise  as tolerated.       Congestive heart failure (HCC)    Asymptomatic minimize sodium and attempt modest weight loss.       Hyperglycemia    hgba1c acceptable, minimize simple carbs. Increase exercise as tolerated.          I am having David Parsons. David Parsons maintain his multivitamin with minerals, aspirin EC, clobetasol ointment, hydrocortisone, fluticasone, azelastine, levocetirizine, augmented betamethasone dipropionate, diclofenac sodium, furosemide, omeprazole, triamterene-hydrochlorothiazide, timolol, ciclopirox, carvedilol, losartan, Klor-Con M20, ferrous sulfate, and amLODipine.  No orders of the defined types were placed in this encounter.  I discussed the assessment and treatment plan with the patient. The patient was provided an opportunity to ask questions and all were answered. The patient agreed with the plan and demonstrated an understanding of the instructions.   The patient was advised to call back or seek an in-person evaluation if the symptoms worsen or if the condition fails to improve as anticipated.  I provided 25 minutes of non-face-to-face time during this encounter.   [name], MD

## 2019-01-13 NOTE — Assessment & Plan Note (Signed)
Well controlled, no changes to meds. Encouraged heart healthy diet such as the DASH diet and exercise as tolerated.  °

## 2019-01-30 ENCOUNTER — Other Ambulatory Visit: Payer: Self-pay | Admitting: Family Medicine

## 2019-02-11 ENCOUNTER — Telehealth: Payer: Self-pay | Admitting: Cardiovascular Disease

## 2019-02-11 NOTE — Telephone Encounter (Signed)
Disability parking application received. Placed in box for Dr. Angelena Form to sign. 02/11/19 vlm

## 2019-02-12 ENCOUNTER — Other Ambulatory Visit: Payer: Self-pay | Admitting: Family Medicine

## 2019-02-14 ENCOUNTER — Other Ambulatory Visit: Payer: Self-pay | Admitting: Family Medicine

## 2019-02-19 ENCOUNTER — Telehealth: Payer: Self-pay | Admitting: Cardiovascular Disease

## 2019-02-19 MED ORDER — LOSARTAN POTASSIUM 100 MG PO TABS: 100.0000 mg | ORAL_TABLET | Freq: Every day | ORAL | 2 refills | Status: DC

## 2019-02-19 NOTE — Telephone Encounter (Signed)
[facility] in [place], stating that medication Losartan 100 mg tablet is on backorder and would like to know if Dr. Angelena Form would like to prescribe an alternative, so pt can get his medication. Please address

## 2019-02-19 NOTE — Telephone Encounter (Signed)
I checked with [facility] in [place] and they have losartan 100 mg in stock.  I placed call to pt to see if prescription could be sent to [facility].  Left message to call back

## 2019-02-19 NOTE — Telephone Encounter (Signed)
Follow up ° ° °Patient's wife is returning call. Please call. °

## 2019-02-19 NOTE — Telephone Encounter (Signed)
Thanks

## 2019-02-19 NOTE — Telephone Encounter (Signed)
I spoke with pt's wife.  She would like me to send prescription to [facility] on [address] in [place]. [facility]

## 2019-02-20 ENCOUNTER — Other Ambulatory Visit: Payer: Self-pay | Admitting: Family Medicine

## 2019-02-20 NOTE — Telephone Encounter (Signed)
Dr. Angelena Form has filled out parking placard information.  Informed the patient's wife, who requests the paperwork mailed to their home. She was grateful for assistance.

## 2019-02-26 ENCOUNTER — Other Ambulatory Visit: Payer: Self-pay | Admitting: Family Medicine

## 2019-03-12 ENCOUNTER — Ambulatory Visit: Payer: Medicare Other | Admitting: Podiatry

## 2019-03-28 ENCOUNTER — Encounter: Payer: Self-pay | Admitting: Podiatry

## 2019-03-28 ENCOUNTER — Other Ambulatory Visit: Payer: Self-pay

## 2019-03-28 ENCOUNTER — Ambulatory Visit (INDEPENDENT_AMBULATORY_CARE_PROVIDER_SITE_OTHER): Payer: Medicare Other | Admitting: Podiatry

## 2019-03-28 VITALS — Temp 98.0°F

## 2019-03-28 DIAGNOSIS — B351 Tinea unguium: Secondary | ICD-10-CM

## 2019-03-28 DIAGNOSIS — M79674 Pain in right toe(s): Secondary | ICD-10-CM

## 2019-03-28 DIAGNOSIS — M79675 Pain in left toe(s): Secondary | ICD-10-CM | POA: Diagnosis not present

## 2019-03-28 NOTE — Patient Instructions (Signed)

## 2019-04-02 NOTE — Progress Notes (Signed)
Subjective:  David Parsons presents to clinic today with cc of  painful, thick, discolored, elongated toenails 1-5 b/l that become tender and cannot cut because of thickness.  Pain is aggravated when wearing enclosed shoe gear.  [name], MD is his PCP.    Current Outpatient Medications:  .  amLODipine (NORVASC) 10 MG tablet, TAKE 1 TABLET BY MOUTH EVERY DAY, Disp: 90 tablet, Rfl: 0 .  aspirin EC 81 MG tablet, Take 81 mg by mouth daily., Disp: , Rfl:  .  augmented betamethasone dipropionate (DIPROLENE-AF) 0.05 % cream, Apply topically 2 (two) times daily., Disp: 15 g, Rfl: 0 .  azelastine (ASTELIN) 0.1 % nasal spray, Place 2 sprays into both nostrils 2 (two) times daily. Use in each nostril as directed, Disp: 30 mL, Rfl: 2 .  carvedilol (COREG) 25 MG tablet, TAKE 1 TABLET BY MOUTH 2 TIMES DAILY WITH A MEAL., Disp: 180 tablet, Rfl: 0 .  ciclopirox (LOPROX) 0.77 % cream, Apply to both feet and between toes twice daily for 4 weeks., Disp: 30 g, Rfl: 1 .  clobetasol ointment (TEMOVATE) 0.05 %, apply to affected area twice a day UPON ITCHING AND IRRITATION(NOT TO FACE OR GROIN), Disp: , Rfl: 0 .  diclofenac sodium (VOLTAREN) 1 % GEL, Apply 4 g topically 4 (four) times daily. To affected joint., Disp: 100 g, Rfl: 11 .  ferrous sulfate 325 (65 FE) MG tablet, TAKE 1 TABLET BY MOUTH 2 (TWO) TIMES DAILY BEFORE A MEAL., Disp: 180 tablet, Rfl: 0 .  fluticasone (FLONASE) 50 MCG/ACT nasal spray, Place 2 sprays into both nostrils daily., Disp: 16 g, Rfl: 1 .  furosemide (LASIX) 40 MG tablet, Take 1 tablet (40 mg total) by mouth 2 (two) times daily., Disp: 60 tablet, Rfl: 12 .  hydrocortisone 2.5 % cream, Apply 1 application topically 2 (two) times daily. , Disp: , Rfl: 0 .  KLOR-CON M20 20 MEQ tablet, TAKE 3 TABLETS (60 MEQ TOTAL) BY MOUTH 3 (THREE) TIMES DAILY., Disp: 270 tablet, Rfl: 0 .  levocetirizine (XYZAL) 5 MG tablet, TAKE 1 TABLET EVERY EVENING, Disp: 30 tablet, Rfl: 0 .  losartan (COZAAR)  100 MG tablet, Take 1 tablet (100 mg total) by mouth daily., Disp: 90 tablet, Rfl: 2 .  Multiple Vitamin (MULITIVITAMIN WITH MINERALS) TABS, Take 1 tablet by mouth daily., Disp: , Rfl:  .  omeprazole (PRILOSEC) 40 MG capsule, Take 1 capsule (40 mg total) by mouth daily., Disp: 90 capsule, Rfl: 3 .  timolol (TIMOPTIC) 0.5 % ophthalmic solution, INSTILL 1 DROP INTO BOTH EYES TWICE A DAY, Disp: , Rfl:  .  triamterene-hydrochlorothiazide (MAXZIDE-25) 37.5-25 MG tablet, TAKE 1 TABLET BY MOUTH EVERY DAY, Disp: 90 tablet, Rfl: 1   Allergies  Allergen Reactions  . Latex Hives    Latex gloves      Objective: Vitals:   03/28/19 1608  Temp: 98 F (36.7 C)    Physical Examination:  Vascular Examination: Capillary refill time immediate x 10 digits.  Palpable DP/PT pulses b/l.  Digital hair absent b/l.  Trace edema noted b/l feet.   Skin temperature gradient WNL b/l.  Dermatological Examination: Skin with normal turgor, texture and tone b/l.  No open wounds b/l.  No interdigital macerations noted b/l.  Elongated, thick, discolored brittle toenails with subungual debris and pain on dorsal palpation of nailbeds 1-5 b/l.  Musculoskeletal Examination: Muscle strength 5/5 to all muscle groups b/l.  No pain, crepitus or joint discomfort with active/passive ROM.  Neurological Examination:  Sensation intact 5/5 b/l with 10 gram monofilament.  Vibratory sensation intact b/l.  Assessment: Mycotic nail infection with pain 1-5 b/l  Plan: 1. Toenails 1-5 b/l were debrided in length and girth without iatrogenic laceration. 2.  Continue soft, supportive shoe gear daily. 3.  Report any pedal injuries to medical professional. 4.  Follow up 3 months. 5.  Patient/POA to call should there be a question/concern in there interim.

## 2019-04-15 ENCOUNTER — Ambulatory Visit (INDEPENDENT_AMBULATORY_CARE_PROVIDER_SITE_OTHER): Payer: Medicare Other | Admitting: Family Medicine

## 2019-04-15 ENCOUNTER — Other Ambulatory Visit: Payer: Self-pay

## 2019-04-15 ENCOUNTER — Other Ambulatory Visit: Payer: Self-pay | Admitting: Family Medicine

## 2019-04-15 DIAGNOSIS — E785 Hyperlipidemia, unspecified: Secondary | ICD-10-CM | POA: Diagnosis not present

## 2019-04-15 DIAGNOSIS — D509 Iron deficiency anemia, unspecified: Secondary | ICD-10-CM

## 2019-04-15 DIAGNOSIS — I2510 Atherosclerotic heart disease of native coronary artery without angina pectoris: Secondary | ICD-10-CM | POA: Diagnosis not present

## 2019-04-15 DIAGNOSIS — R739 Hyperglycemia, unspecified: Secondary | ICD-10-CM | POA: Diagnosis not present

## 2019-04-15 DIAGNOSIS — I10 Essential (primary) hypertension: Secondary | ICD-10-CM

## 2019-04-15 DIAGNOSIS — R972 Elevated prostate specific antigen [PSA]: Secondary | ICD-10-CM | POA: Diagnosis not present

## 2019-04-16 NOTE — Progress Notes (Signed)
Chief Complaint  Patient presents with  . 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 He was referred in May 2010 for SOB, abdominal swelling and LE edema. He was diagnosed with a non-ischemic CM, moderate AI, non-obstructive CAD and a dilated aortic root. His aortic root continued to enlarge. Cardiac cath March 2013 showed enlarged root, no obstructive disease in the left system. I could not engage the RCA secondary to the abnormal takeoff with the enlarged aortic root. CTA was performed and showed dilated aortic root with patent RCA. He underwent aortic root replacement, aortic valve replacement and SVG to RCA on 12/28/11.  Echo April 2016 with normal LV function, mild gradient across the valve.   He is here today for follow up. The patient denies any chest pain, dyspnea, palpitations, lower extremity edema, orthopnea, PND, dizziness, near syncope or syncope.   Primary Care Physician: [name], MD  Past Medical History:  Diagnosis Date  . Aortic insufficiency     02/16/09 Chest CT:  The aortic root has a diameter measuring 4.6 cm, image 64 of the coronal series.  The ascending    thoracic aorta has a diameter of 3.8 cm, image 65 of the coronal series.  At the level of the aortic arch    the thoracic aorta has a maximum diameter of 3.2 cm.  The descending thoracic aorta has a maximum           diameter of 3.2 cm.  There is no evidence for aortic disse        . Aortic root enlargement (HCC)    The aortic root has a diameter measuring 4.6 cm, image 64 of the coronal series.  The ascending  thoracic aorta has a diameter of 3.8 cm, image 65 of the coronal series.  At the level of the aortic arch   the thoracic aorta has a  maximum diameter of 3.2 cm.  The descending thoracic aorta has a maximum   diameter of 3.2 cm.  There is no evidence for aortic dissection.       Marland Kitchen CAD (coronary artery disease)   . Cardiomyopathy     01/2009 - Cath - nonobs. dzs.  EF 35%.  . CHF (congestive heart failure) ([place])   . Diabetes mellitus   . Dyslipidemia 01/14/2013  . Gallstones    s/p cholecystectomy    . GERD (gastroesophageal reflux disease)   . Glaucoma 10/05/2016  . Heart murmur   . Helicobacter pylori gastritis    (Tx Pylera 4/10)  . Hypertension   . Hypokalemia   . Iron deficiency anemia   . Obesity, Class III, BMI 40-49.9 (morbid obesity) ([place])   . Onychomycosis 03/09/2015  . PONV (postoperative nausea and vomiting)   . 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 Sleep apnea    occasionally wears CPAP at night    Past Surgical History:  Procedure Laterality Date  . ADRENALECTOMY    . BENTALL  PROCEDURE  12/28/2011   Procedure: BENTALL PROCEDURE;  Surgeon: Rexene Alberts, MD;  Location: [facility];  Service: Open Heart Surgery;  Laterality: N/A;  . CARDIAC CATHETERIZATION     2013  . CARDIAC VALVE REPLACEMENT  12/28/2011   Bentall with tissue valve  . CHOLECYSTECTOMY    . COLONOSCOPY  09/25/2011  . COLONOSCOPY W/ BIOPSIES AND POLYPECTOMY  11/04/2008   colon polyps, diverticulosis   . CORONARY ARTERY BYPASS GRAFT  12/28/2011   Procedure: CORONARY ARTERY BYPASS GRAFTING (CABG);  Surgeon: Rexene Alberts, MD;  Location: [place];  Service: Open Heart Surgery;  Laterality: N/A;  cabg x 1  . LEFT AND RIGHT HEART CATHETERIZATION WITH CORONARY ANGIOGRAM N/A 11/22/2011   Procedure: LEFT AND RIGHT HEART CATHETERIZATION WITH CORONARY ANGIOGRAM;  Surgeon: Burnell Blanks, MD;  Location:  [facility] CATH LAB;  Service: Cardiovascular;  Laterality: N/A;  . MULTIPLE TOOTH EXTRACTIONS    . TEE WITHOUT CARDIOVERSION  11/22/2011   Procedure: TRANSESOPHAGEAL ECHOCARDIOGRAM (TEE);  Surgeon: Larey Dresser, MD;  Location: [place];  Service: Cardiovascular;  Laterality: N/A;  . UPPER GASTROINTESTINAL ENDOSCOPY  11/04/2008   w/bx, H pylori gastritis  . UPPER GASTROINTESTINAL ENDOSCOPY  09/25/2011    Current Outpatient Medications  Medication Sig Dispense Refill  . amLODipine (NORVASC) 10 MG tablet TAKE 1 TABLET BY MOUTH EVERY DAY 90 tablet 0  . aspirin EC 81 MG tablet Take 81 mg by mouth daily.    Marland Kitchen augmented betamethasone dipropionate (DIPROLENE-AF) 0.05 % cream Apply topically 2 (two) times daily. 15 g 0  . azelastine (ASTELIN) 0.1 % nasal spray Place 2 sprays into both nostrils 2 (two) times daily. Use in each nostril as directed 30 mL 2  . carvedilol (COREG) 25 MG tablet TAKE 1 TABLET BY MOUTH 2 TIMES DAILY WITH A MEAL. 180 tablet 0  . ciclopirox (LOPROX) 0.77 % cream Apply to both feet and between toes twice daily for 4 weeks. 30 g 1  . clobetasol ointment (TEMOVATE) 0.05 % apply to affected area twice a day UPON ITCHING AND IRRITATION(NOT TO FACE OR GROIN)  0  . diclofenac sodium (VOLTAREN) 1 % GEL Apply 4 g topically 4 (four) times daily. To affected joint. 100 g 11  . ferrous sulfate 325 (65 FE) MG tablet TAKE 1 TABLET BY MOUTH 2 (TWO) TIMES DAILY BEFORE A MEAL. 180 tablet 0  . fluticasone (FLONASE) 50 MCG/ACT nasal spray Place 2 sprays into both nostrils daily. 16 g 1  . furosemide (LASIX) 40 MG tablet Take 1 tablet (40 mg total) by mouth 2 (two) times daily. 60 tablet 12  . hydrocortisone 2.5 % cream Apply 1 application topically 2 (two) times daily.   0  . KLOR-CON M20 20 MEQ tablet TAKE 3 TABLETS (60 MEQ TOTAL) BY MOUTH 3 (THREE) TIMES DAILY. 270 tablet 1  . levocetirizine (XYZAL) 5 MG tablet TAKE 1 TABLET EVERY EVENING 30 tablet 0  . losartan (COZAAR) 100 MG tablet Take 1 tablet  (100 mg total) by mouth daily. 90 tablet 2  . Multiple Vitamin (MULITIVITAMIN WITH MINERALS) TABS Take 1 tablet by mouth daily.    Marland Kitchen omeprazole (PRILOSEC) 40 MG capsule Take 1 capsule (40 mg total) by mouth daily. 90 capsule 3  . timolol (TIMOPTIC) 0.5 % ophthalmic solution INSTILL 1 DROP INTO BOTH EYES TWICE A DAY    . triamterene-hydrochlorothiazide (MAXZIDE-25) 37.5-25 MG tablet TAKE 1 TABLET BY MOUTH EVERY DAY 90 tablet 1   No current facility-administered medications  for this visit.     Allergies  Allergen Reactions  . Latex Hives    Latex gloves     Social History   Socioeconomic History  . Marital status: Married    Spouse name: [name]  . Number of children: 0  . Years of education: Not on file  . 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 Financial resource strain: Not on file  . Food insecurity    Worry: Not on file    Inability: Not on file  . Transportation needs    Medical: Not on file    Non-medical: Not on file  Tobacco Use  . Smoking status: Never Smoker  . Smokeless tobacco: Never Used  Substance and Sexual Activity  . Alcohol use: No  . Drug use: No  . Sexual activity: Not Currently    Comment: lives with wife, pastros 2 churches  Lifestyle  . Physical activity    Days per week: Not on file    Minutes per session: Not on file  . Stress: Not on file  Relationships  . Social Herbalist on phone: Not on file    Gets together: Not on file    Attends religious service: Not on file    Active member of club or organization: Not on file    Attends meetings of clubs or organizations: Not on file    Relationship status: Not on file  . Intimate partner violence    Fear of current or ex partner: Not on file    Emotionally abused: Not on file    Physically abused: Not on file    Forced sexual activity: Not on file  Other Topics  Concern  . Not on file  Social History Narrative   The patient is married, lives with his wife in  [place].  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  He  lives a very sedentary lifestyle.  He is a nonsmoker.  He denies alcohol consumption.     Family History  Problem Relation Age of Onset  . Breast cancer Sister   . Diabetes Sister   . Colon cancer Brother 70       at least 61  . Diabetes Brother   . Cancer Brother        colon, prostate  . Prostate cancer Brother   . Diabetes Brother   . Diabetes Sister   . Cancer Sister        breast, colon, melanoma  . Heart disease Sister   . Hypertension Mother   . Arthritis Mother   . Kidney failure Sister   . Colon cancer Sister   . Breast cancer Sister   . Kidney disease Sister        s/p transplant  . Hypertension Sister   . Diabetes Sister   . Stroke Father   . Hypertension Father     Review of Systems:  As stated in the HPI and otherwise negative.   BP (!) 150/80   Pulse 73   Ht 6' (1.829 m)   Wt (!) 330 lb (149.7 kg)   SpO2 97%   BMI 44.76 kg/m   Physical Examination:  General: Well developed, well nourished, NAD  HEENT: OP clear, mucus membranes moist  SKIN: warm, dry. No rashes. Neuro: No focal deficits  Musculoskeletal:  Muscle strength 5/5 all ext  Psychiatric: Mood and affect normal  Neck: No JVD, no carotid bruits, no thyromegaly, no lymphadenopathy.  Lungs:Clear bilaterally, no wheezes, rhonci, crackles Cardiovascular: Regular rate and rhythm. No murmurs, gallops or rubs. Abdomen:Soft. Bowel sounds present. Non-tender.  Extremities: No lower extremity edema. Pulses are 2 + in the bilateral DP/PT.  Echo 12/16/14: Left ventricle: The cavity size was normal. Wall thickness was increased in a pattern of moderate LVH. Systolic function was normal. The estimated ejection fraction was in the range of 60% to 65%. Although no diagnostic regional wall motion abnormality was identified,  this possibility cannot be completely excluded on the basis of this study. Doppler parameters are consistent with abnormal left ventricular relaxation (grade 1 diastolic dysfunction). - Ventricular septum: Mild D-shape to the interventricular septum, suggestive of RV pressure/volume overload. - Aortic valve: Bioprosthetic aortic valve is present. The gradient across the bioprosthetic aortic valve was not significantly elevated. There was no regurgitation. Mean gradient (S): 14 mm Hg. - Aorta: Mildly dilated aortic root. Aortic root dimension: 38 mm (ED). - Mitral valve: Mildly calcified annulus. Mildly calcified leaflets . There was no significant regurgitation. - Left atrium: The atrium was mildly dilated. - Right ventricle: Poorly visualized. The cavity size was mildly dilated. Systolic function was mildly reduced. - Right atrium: The atrium was mildly dilated. - Pulmonary arteries: No complete TR doppler jet so unable to estimate PA systolic pressure. - Inferior vena cava: The vessel was normal in size. The respirophasic diameter changes were in the normal range (>= 50%), consistent with normal central venous pressure.  Impressions:  - Technically difficult study with poor acoustic windows. Normal LV size with moderate LV hypertrophy. EF 60-65%. Mildly D-shaped interventricular septum, suggestive of RV pressure/volume overload. The RV was poorly visualized but appeared mildly dilated with mildly decreased systolic function. Bioprosthetic aortic valve appeared to function normally.   EKG:  EKG is ordered today. The ekg ordered today demonstrates NSR with 1st degree AV block. Rate 73 bpm. RBBB  Recent Labs: 05/14/2018: Hemoglobin 14.4; Platelets 269.0; TSH 1.89 08/12/2018: ALT 12; BUN 12; Creatinine, Ser 1.01; Potassium 3.9; Sodium 142   Lipid Panel    Component Value Date/Time   CHOL 146 05/14/2018 0947   TRIG 111.0 05/14/2018 0947    HDL 34.20 (L) 05/14/2018 0947   CHOLHDL 4 05/14/2018 0947   VLDL 22.2 05/14/2018 0947   LDLCALC 90 05/14/2018 0947     Wt Readings from Last 3 Encounters:  04/17/19 (!) 330 lb (149.7 kg)  08/12/18 (!) 335 lb 3.2 oz (152 kg)  05/14/18 (!) 334 lb 12.8 oz (151.9 kg)     Other studies Reviewed: Additional studies/ records that were reviewed today include: . Review of the above records demonstrates:   Assessment and Plan:   1. CAD s/p CABG without angina: He had a single vessel bypass to RCA at the time of his root replacement and AVR. No significant coronary atherosclerosis noted prior to his surgery. No chest pain. Continue ASA and beta blocker. He has not been on a statin. (no CAD noted on pre surgery cath).     2. Chronic diastolic CHF:  No evidence of volume overload. Weight is stable. Continue Lasix 40 mg twice daily.    3. HYPERTENSION: BP has been managed in primary care. BP is well controlled at home.   4. S/P aortic valve replacement and aortoplasty: LV function normal by echo April 2016. AVR working well with mild expected gradient across valve.  He will continue using SBP prophylaxis prior to dental procedures Repeat echo now.   Current medicines are reviewed at length with the patient today.  The patient does not have concerns regarding medicines.  The following changes have been made:  no change  Labs/ tests ordered today include:   Orders Placed This Encounter  Procedures  . EKG 12-Lead  . ECHOCARDIOGRAM COMPLETE    Disposition:   FU with me in 12 months  Signed, Lauree Chandler, MD 04/17/2019 10:44 AM    [facility] [address], [place], [place]  [address] Phone: [phone]; Fax: [phone]

## 2019-04-17 ENCOUNTER — Ambulatory Visit (HOSPITAL_COMMUNITY): Payer: Medicare Other | Attending: Cardiology

## 2019-04-17 ENCOUNTER — Other Ambulatory Visit: Payer: Self-pay

## 2019-04-17 ENCOUNTER — Encounter: Payer: Self-pay | Admitting: Cardiovascular Disease

## 2019-04-17 ENCOUNTER — Ambulatory Visit (INDEPENDENT_AMBULATORY_CARE_PROVIDER_SITE_OTHER): Payer: Medicare Other | Admitting: Cardiovascular Disease

## 2019-04-17 VITALS — BP 150/80 | HR 73 | Ht 72.0 in | Wt 330.0 lb

## 2019-04-17 DIAGNOSIS — I5032 Chronic diastolic (congestive) heart failure: Secondary | ICD-10-CM

## 2019-04-17 DIAGNOSIS — I2510 Atherosclerotic heart disease of native coronary artery without angina pectoris: Secondary | ICD-10-CM | POA: Diagnosis not present

## 2019-04-17 DIAGNOSIS — I10 Essential (primary) hypertension: Secondary | ICD-10-CM | POA: Diagnosis not present

## 2019-04-17 DIAGNOSIS — I359 Nonrheumatic aortic valve disorder, unspecified: Secondary | ICD-10-CM | POA: Insufficient documentation

## 2019-04-17 LAB — ECHOCARDIOGRAM COMPLETE
Height: 72 in
Weight: 5280 oz

## 2019-04-17 MED ORDER — PERFLUTREN LIPID MICROSPHERE
1.0000 mL | INTRAVENOUS | Status: AC | PRN
  Administered 2019-04-17: 3 mL via INTRAVENOUS

## 2019-04-17 NOTE — Patient Instructions (Signed)
Medication Instructions:  Your physician recommends that you continue on your current medications as directed. Please refer to the Current Medication list given to you today.  If you need a refill on your cardiac medications before your next appointment, please call your pharmacy.   Lab work: None If you have labs (blood work) drawn today and your tests are completely normal, you will receive your results only by: . MyChart Message (if you have MyChart) OR . A paper copy in the mail If you have any lab test that is abnormal or we need to change your treatment, we will call you to review the results.  Testing/Procedures: Your physician has requested that you have an echocardiogram. Echocardiography is a painless test that uses sound waves to create images of your heart. It provides your doctor with information about the size and shape of your heart and how well your heart's chambers and valves are working. This procedure takes approximately one hour. There are no restrictions for this procedure.    Follow-Up: At [facility], you and your health needs are our priority.  As part of our continuing mission to provide you with exceptional heart care, we have created designated Provider Care Teams.  These Care Teams include your primary Cardiologist (physician) and Advanced Practice Providers (APPs -  Physician Assistants and Nurse Practitioners) who all work together to provide you with the care you need, when you need it. You will need a follow up appointment in 12 months.  Please call our office 2 months in advance to schedule this appointment.  You may see Christopher McAlhany, MD or one of the following Advanced Practice Providers on your designated Care Team:   Brittainy Simmons, PA-C Dayna Dunn, PA-C . Michele Lenze, PA-C  Any Other Special Instructions Will Be Listed Below (If Applicable).    

## 2019-04-20 NOTE — Assessment & Plan Note (Signed)
Encouraged to check vitals weekly no changes to meds. Encouraged heart healthy diet such as the DASH diet and exercise as tolerated.  

## 2019-04-20 NOTE — Progress Notes (Addendum)
Virtual Visit via phone Note  I connected with David Parsons on 04/15/19 at 10:40 AM EDT by a phone enabled telemedicine application and verified that I am speaking with the correct person using two identifiers.  Location: Patient: home Provider: office   I discussed the limitations of evaluation and management by telemedicine and the availability of in person appointments. The patient expressed understanding and agreed to proceed. [name], CMA was able to get patient set up on visit, phone after being unable to set up the video.    Subjective:    Patient ID: David Parsons, male    DOB: [birth year], 78 y.o.   MRN: [id number]  No chief complaint on file.   HPI Patient is in today for follow up on chronic medicl concerns including coronary artery disease, obesity, hypertension and more. No recent febrile illness or hospitalizations. No polyuria or polydipsia. Denies CP/palp/SOB/HA/congestion/fevers/GI or GU c/o. Taking meds as prescribed  Past Medical History:  Diagnosis Date  . Aortic insufficiency     02/16/09 Chest CT:  The aortic root has a diameter measuring 4.6 cm, image 64 of the coronal series.  The ascending    thoracic aorta has a diameter of 3.8 cm, image 65 of the coronal series.  At the level of the aortic arch    the thoracic aorta has a maximum diameter of 3.2 cm.  The descending thoracic aorta has a maximum           diameter of 3.2 cm.  There is no evidence for aortic disse        . Aortic root enlargement (HCC)    The aortic root has a diameter measuring 4.6 cm, image 64 of the coronal series.  The ascending  thoracic aorta has a diameter of 3.8 cm, image 65 of the coronal series.  At the level of the aortic arch   the thoracic aorta has a maximum diameter of 3.2 cm.  The descending thoracic aorta has a maximum   diameter of 3.2 cm.  There is no evidence for aortic dissection.       Marland Kitchen CAD (coronary artery disease)   . Cardiomyopathy     01/2009 - Cath - nonobs. dzs.   EF 35%.  . CHF (congestive heart failure) ([place])   . Diabetes mellitus   . Dyslipidemia 01/14/2013  . Gallstones    s/p cholecystectomy    . GERD (gastroesophageal reflux disease)   . Glaucoma 10/05/2016  . Heart murmur   . Helicobacter pylori gastritis    (Tx Pylera 4/10)  . Hypertension   . Hypokalemia   . Iron deficiency anemia   . Obesity, Class III, BMI 40-49.9 (morbid obesity) ([place])   . Onychomycosis 03/09/2015  . PONV (postoperative nausea and vomiting)   . 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 Sleep apnea    occasionally wears CPAP at night    Past Surgical History:  Procedure Laterality Date  . ADRENALECTOMY    . BENTALL PROCEDURE  12/28/2011   Procedure: BENTALL PROCEDURE;  Surgeon: Rexene Alberts, MD;  Location: [facility];  Service: Open Heart Surgery;  Laterality: N/A;  . CARDIAC CATHETERIZATION     2013  . CARDIAC VALVE REPLACEMENT  12/28/2011   Deneen Harts  with tissue valve  . CHOLECYSTECTOMY    . COLONOSCOPY  09/25/2011  . COLONOSCOPY W/ BIOPSIES AND POLYPECTOMY  11/04/2008   colon polyps, diverticulosis   . CORONARY ARTERY BYPASS GRAFT  12/28/2011   Procedure: CORONARY ARTERY BYPASS GRAFTING (CABG);  Surgeon: Rexene Alberts, MD;  Location: [facility];  Service: Open Heart Surgery;  Laterality: N/A;  cabg x 1  . LEFT AND RIGHT HEART CATHETERIZATION WITH CORONARY ANGIOGRAM N/A 11/22/2011   Procedure: LEFT AND RIGHT HEART CATHETERIZATION WITH CORONARY ANGIOGRAM;  Surgeon: Burnell Blanks, MD;  Location: [facility] CATH LAB;  Service: Cardiovascular;  Laterality: N/A;  . MULTIPLE TOOTH EXTRACTIONS    . TEE WITHOUT CARDIOVERSION  11/22/2011   Procedure: TRANSESOPHAGEAL ECHOCARDIOGRAM (TEE);  Surgeon: Larey Dresser, MD;  Location: [facility];  Service: Cardiovascular;  Laterality: N/A;  . UPPER GASTROINTESTINAL ENDOSCOPY  11/04/2008   w/bx, H pylori gastritis  . UPPER GASTROINTESTINAL ENDOSCOPY  09/25/2011    Family History  Problem Relation Age of Onset  . Breast cancer Sister   . Diabetes Sister   . Colon cancer Brother 70       at least 68  . Diabetes Brother   . Cancer Brother        colon, prostate  . Prostate cancer Brother   . Diabetes Brother   . Diabetes Sister   . Cancer Sister        breast, colon, melanoma  . Heart disease Sister   . Hypertension Mother   . Arthritis Mother   . Kidney failure Sister   . Colon cancer Sister   . Breast cancer Sister   . Kidney disease Sister        s/p transplant  . Hypertension Sister   . Diabetes Sister   . Stroke Father   . Hypertension Father     Social History   Socioeconomic History  . Marital status: Married    Spouse name: [name]  . Number of children: 0  . Years of education: Not on file  . 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 Financial resource strain: Not on file  . Food insecurity    Worry: Not on file    Inability: Not on file  . Transportation needs    Medical: Not on file    Non-medical: Not on file  Tobacco Use  . Smoking status: Never Smoker  . Smokeless tobacco: Never Used  Substance and Sexual Activity  . Alcohol use: No  . Drug use: No  . Sexual activity: Not Currently    Comment: lives with wife, pastros 2 churches  Lifestyle  . Physical activity    Days per week: Not on file    Minutes per session: Not on file  . Stress: Not on file  Relationships  . Social Herbalist on phone: Not on file    Gets together: Not on file    Attends religious service: Not on file    Active member of club or organization: Not on file    Attends meetings of clubs or organizations: Not on file    Relationship  status: Not on file  . Intimate partner violence    Fear of current or ex partner: Not on file    Emotionally abused: Not on file    Physically abused: Not on file  Forced sexual activity: Not on file  Other Topics Concern  . Not on file  Social History Narrative   The patient is married, lives with his wife in  [place].  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  He  lives a very sedentary lifestyle.  He is a nonsmoker.  He denies alcohol consumption.     Outpatient Medications Prior to Visit  Medication Sig Dispense Refill  . amLODipine (NORVASC) 10 MG tablet TAKE 1 TABLET BY MOUTH EVERY DAY 90 tablet 0  . aspirin EC 81 MG tablet Take 81 mg by mouth daily.    Marland Kitchen augmented betamethasone dipropionate (DIPROLENE-AF) 0.05 % cream Apply topically 2 (two) times daily. 15 g 0  . azelastine (ASTELIN) 0.1 % nasal spray Place 2 sprays into both nostrils 2 (two) times daily. Use in each nostril as directed 30 mL 2  . carvedilol (COREG) 25 MG tablet TAKE 1 TABLET BY MOUTH 2 TIMES DAILY WITH A MEAL. 180 tablet 0  . ciclopirox (LOPROX) 0.77 % cream Apply to both feet and between toes twice daily for 4 weeks. 30 g 1  . clobetasol ointment (TEMOVATE) 0.05 % apply to affected area twice a day UPON ITCHING AND IRRITATION(NOT TO FACE OR GROIN)  0  . diclofenac sodium (VOLTAREN) 1 % GEL Apply 4 g topically 4 (four) times daily. To affected joint. 100 g 11  . ferrous sulfate 325 (65 FE) MG tablet TAKE 1 TABLET BY MOUTH 2 (TWO) TIMES DAILY BEFORE A MEAL. 180 tablet 0  . fluticasone (FLONASE) 50 MCG/ACT nasal spray Place 2 sprays into both nostrils daily. 16 g 1  . furosemide (LASIX) 40 MG tablet Take 1 tablet (40 mg total) by mouth 2 (two) times daily. 60 tablet 12  . hydrocortisone 2.5 % cream Apply 1 application topically 2 (two) times daily.   0  . KLOR-CON M20 20 MEQ tablet TAKE 3 TABLETS (60 MEQ TOTAL) BY MOUTH 3 (THREE) TIMES DAILY. 270 tablet 1  . levocetirizine (XYZAL) 5 MG tablet  TAKE 1 TABLET EVERY EVENING 30 tablet 0  . losartan (COZAAR) 100 MG tablet Take 1 tablet (100 mg total) by mouth daily. 90 tablet 2  . Multiple Vitamin (MULITIVITAMIN WITH MINERALS) TABS Take 1 tablet by mouth daily.    Marland Kitchen omeprazole (PRILOSEC) 40 MG capsule Take 1 capsule (40 mg total) by mouth daily. 90 capsule 3  . timolol (TIMOPTIC) 0.5 % ophthalmic solution INSTILL 1 DROP INTO BOTH EYES TWICE A DAY    . triamterene-hydrochlorothiazide (MAXZIDE-25) 37.5-25 MG tablet TAKE 1 TABLET BY MOUTH EVERY DAY 90 tablet 1   No facility-administered medications prior to visit.     Allergies  Allergen Reactions  . Latex Hives    Latex gloves     Review of Systems  Constitutional: Positive for malaise/fatigue. Negative for fever.  HENT: Negative for congestion.   Eyes: Negative for blurred vision.  Respiratory: Negative for shortness of breath.   Cardiovascular: Negative for chest pain, palpitations and leg swelling.  Gastrointestinal: Negative for abdominal pain, blood in stool and nausea.  Genitourinary: Negative for dysuria and frequency.  Musculoskeletal: Negative for falls.  Skin: Negative for rash.  Neurological: Negative for dizziness, loss of consciousness and headaches.  Endo/Heme/Allergies: Negative for environmental allergies.  Psychiatric/Behavioral: Negative for depression. The patient is not nervous/anxious.        Objective:    Physical Exam unable to obtain via phone  There were no vitals taken for this  visit. Wt Readings from Last 3 Encounters:  04/17/19 (!) 330 lb (149.7 kg)  08/12/18 (!) 335 lb 3.2 oz (152 kg)  05/14/18 (!) 334 lb 12.8 oz (151.9 kg)    Diabetic Foot Exam - Simple   No data filed     Lab Results  Component Value Date   WBC 8.8 05/14/2018   HGB 14.4 05/14/2018   HCT 43.1 05/14/2018   PLT 269.0 05/14/2018   GLUCOSE 137 (H) 08/12/2018   CHOL 146 05/14/2018   TRIG 111.0 05/14/2018   HDL 34.20 (L) 05/14/2018   LDLCALC 90 05/14/2018   ALT 12  08/12/2018   AST 15 08/12/2018   NA 142 08/12/2018   K 3.9 08/12/2018   CL 103 08/12/2018   CREATININE 1.01 08/12/2018   BUN 12 08/12/2018   CO2 32 08/12/2018   TSH 1.89 05/14/2018   PSA 7.32 (H) 09/07/2010   INR 1.21 01/02/2012   HGBA1C 5.8 05/14/2018    Lab Results  Component Value Date   TSH 1.89 05/14/2018   Lab Results  Component Value Date   WBC 8.8 05/14/2018   HGB 14.4 05/14/2018   HCT 43.1 05/14/2018   MCV 87.7 05/14/2018   PLT 269.0 05/14/2018   Lab Results  Component Value Date   NA 142 08/12/2018   K 3.9 08/12/2018   CO2 32 08/12/2018   GLUCOSE 137 (H) 08/12/2018   BUN 12 08/12/2018   CREATININE 1.01 08/12/2018   BILITOT 1.3 (H) 08/12/2018   ALKPHOS 61 08/12/2018   AST 15 08/12/2018   ALT 12 08/12/2018   PROT 6.6 08/12/2018   ALBUMIN 3.9 08/12/2018   CALCIUM 9.4 08/12/2018   GFR 92.25 08/12/2018   Lab Results  Component Value Date   CHOL 146 05/14/2018   Lab Results  Component Value Date   HDL 34.20 (L) 05/14/2018   Lab Results  Component Value Date   LDLCALC 90 05/14/2018   Lab Results  Component Value Date   TRIG 111.0 05/14/2018   Lab Results  Component Value Date   CHOLHDL 4 05/14/2018   Lab Results  Component Value Date   HGBA1C 5.8 05/14/2018       Assessment & Plan:   Problem List Items Addressed This Visit    Essential hypertension    Encouraged to check vitals weekly no changes to meds. Encouraged heart healthy diet such as the DASH diet and exercise as tolerated.       Relevant Orders   CBC   Comprehensive metabolic panel   TSH   CAD, NATIVE VESSEL    Following with cardiology and asymptomatic.       Hyperglycemia - Primary    hgba1c acceptable, minimize simple carbs. Increase exercise as tolerated.       Relevant Orders   Hemoglobin A1c   PSA, INCREASED    Continues to follow with urology      Iron deficiency anemia    Increase leafy greens, consider increased lean red meat and using cast iron  cookware. Continue to monitor, report any concerns.       Dyslipidemia    Encouraged heart healthy diet, increase exercise, avoid trans fats, consider a krill oil cap daily      Relevant Orders   Lipid panel      I am having David Parsons. Carreau maintain his multivitamin with minerals, aspirin EC, clobetasol ointment, hydrocortisone, fluticasone, azelastine, levocetirizine, augmented betamethasone dipropionate, diclofenac sodium, furosemide, omeprazole, timolol, ciclopirox, amLODipine, triamterene-hydrochlorothiazide, ferrous sulfate, losartan, carvedilol,  and Klor-Con M20.  No orders of the defined types were placed in this encounter.    I discussed the assessment and treatment plan with the patient. The patient was provided an opportunity to ask questions and all were answered. The patient agreed with the plan and demonstrated an understanding of the instructions.   The patient was advised to call back or seek an in-person evaluation if the symptoms worsen or if the condition fails to improve as anticipated.  I provided 25 minutes of non-face-to-face time during this encounter.   [name], MD

## 2019-04-20 NOTE — Assessment & Plan Note (Signed)
Encouraged heart healthy diet, increase exercise, avoid trans fats, consider a krill oil cap daily 

## 2019-04-20 NOTE — Assessment & Plan Note (Signed)
Following with cardiology and asymptomatic.

## 2019-04-20 NOTE — Assessment & Plan Note (Signed)
Continues to follow with urology.

## 2019-04-20 NOTE — Assessment & Plan Note (Signed)
hgba1c acceptable, minimize simple carbs. Increase exercise as tolerated.  

## 2019-04-20 NOTE — Assessment & Plan Note (Signed)
Increase leafy greens, consider increased lean red meat and using cast iron cookware. Continue to monitor, report any concerns 

## 2019-04-21 ENCOUNTER — Other Ambulatory Visit: Payer: Self-pay | Admitting: Family Medicine

## 2019-04-22 ENCOUNTER — Other Ambulatory Visit: Payer: Self-pay

## 2019-04-22 ENCOUNTER — Other Ambulatory Visit (INDEPENDENT_AMBULATORY_CARE_PROVIDER_SITE_OTHER): Payer: Medicare Other

## 2019-04-22 ENCOUNTER — Ambulatory Visit (INDEPENDENT_AMBULATORY_CARE_PROVIDER_SITE_OTHER): Payer: Medicare Other

## 2019-04-22 DIAGNOSIS — R739 Hyperglycemia, unspecified: Secondary | ICD-10-CM

## 2019-04-22 DIAGNOSIS — E785 Hyperlipidemia, unspecified: Secondary | ICD-10-CM | POA: Diagnosis not present

## 2019-04-22 DIAGNOSIS — Z23 Encounter for immunization: Secondary | ICD-10-CM | POA: Diagnosis not present

## 2019-04-22 DIAGNOSIS — I10 Essential (primary) hypertension: Secondary | ICD-10-CM

## 2019-04-22 LAB — CBC
HCT: 42.4 % (ref 39.0–52.0)
Hemoglobin: 14.2 g/dL (ref 13.0–17.0)
MCHC: 33.6 g/dL (ref 30.0–36.0)
MCV: 89.2 fl (ref 78.0–100.0)
Platelets: 280 10*3/uL (ref 150.0–400.0)
RBC: 4.75 Mil/uL (ref 4.22–5.81)
RDW: 13.7 % (ref 11.5–15.5)
WBC: 9.1 10*3/uL (ref 4.0–10.5)

## 2019-04-22 LAB — LIPID PANEL
Cholesterol: 156 mg/dL (ref 0–200)
HDL: 35.3 mg/dL — ABNORMAL LOW (ref >39.00)
LDL Cholesterol: 96 mg/dL (ref 0–99)
NonHDL: 120.22
Total CHOL/HDL Ratio: 4
Triglycerides: 119 mg/dL (ref 0.0–149.0)
VLDL: 23.8 mg/dL (ref 0.0–40.0)

## 2019-04-22 LAB — COMPREHENSIVE METABOLIC PANEL
ALT: 14 U/L (ref 0–53)
AST: 14 U/L (ref 0–37)
Albumin: 4 g/dL (ref 3.5–5.2)
Alkaline Phosphatase: 71 U/L (ref 39–117)
BUN: 11 mg/dL (ref 6–23)
CO2: 30 mEq/L (ref 19–32)
Calcium: 9.2 mg/dL (ref 8.4–10.5)
Chloride: 99 mEq/L (ref 96–112)
Creatinine, Ser: 0.88 mg/dL (ref 0.40–1.50)
GFR: 101.56 mL/min (ref >60.00)
Glucose, Bld: 116 mg/dL — ABNORMAL HIGH (ref 70–99)
Potassium: 3.6 mEq/L (ref 3.5–5.1)
Sodium: 140 mEq/L (ref 135–145)
Total Bilirubin: 1.1 mg/dL (ref 0.2–1.2)
Total Protein: 6.8 g/dL (ref 6.0–8.3)

## 2019-04-22 LAB — HEMOGLOBIN A1C: Hgb A1c MFr Bld: 5.6 % (ref 4.6–6.5)

## 2019-04-22 LAB — TSH: TSH: 2.36 u[IU]/mL (ref 0.35–4.50)

## 2019-04-22 MED ORDER — TETANUS-DIPHTHERIA TOXOIDS TD 5-2 LFU IM INJ: 0.5000 mL | INJECTION | Freq: Once | INTRAMUSCULAR | 0 refills | Status: AC

## 2019-04-22 NOTE — Progress Notes (Signed)
Patient came in today to have his High dose flu shot per Dr. Charlett Blake. He tolerated 0.16mL in his left deltoid  with no complications. Patient also was given an rx for a TD vaccine to take to his pharmacy.

## 2019-05-04 ENCOUNTER — Other Ambulatory Visit: Payer: Self-pay | Admitting: Family Medicine

## 2019-05-17 ENCOUNTER — Other Ambulatory Visit: Payer: Self-pay | Admitting: Family Medicine

## 2019-06-12 ENCOUNTER — Other Ambulatory Visit: Payer: Self-pay | Admitting: Family Medicine

## 2019-07-04 ENCOUNTER — Other Ambulatory Visit: Payer: Self-pay

## 2019-07-04 ENCOUNTER — Encounter: Payer: Self-pay | Admitting: Podiatry

## 2019-07-04 ENCOUNTER — Ambulatory Visit (INDEPENDENT_AMBULATORY_CARE_PROVIDER_SITE_OTHER): Payer: Medicare Other | Admitting: Podiatry

## 2019-07-04 DIAGNOSIS — M79674 Pain in right toe(s): Secondary | ICD-10-CM

## 2019-07-04 DIAGNOSIS — M79675 Pain in left toe(s): Secondary | ICD-10-CM | POA: Diagnosis not present

## 2019-07-04 DIAGNOSIS — L84 Corns and callosities: Secondary | ICD-10-CM | POA: Diagnosis not present

## 2019-07-04 DIAGNOSIS — B351 Tinea unguium: Secondary | ICD-10-CM | POA: Diagnosis not present

## 2019-07-04 DIAGNOSIS — L853 Xerosis cutis: Secondary | ICD-10-CM | POA: Diagnosis not present

## 2019-07-04 DIAGNOSIS — E119 Type 2 diabetes mellitus without complications: Secondary | ICD-10-CM

## 2019-07-04 MED ORDER — AMMONIUM LACTATE 12 % EX LOTN: 1.0000 "application " | TOPICAL_LOTION | Freq: Two times a day (BID) | CUTANEOUS | 4 refills | Status: DC

## 2019-07-04 NOTE — Patient Instructions (Signed)

## 2019-07-10 NOTE — Progress Notes (Signed)
Subjective: David Parsons is seen today for follow up painful, elongated, thickened toenails 1-5 b/l feet that he cannot cut. Pain interferes with daily activities. Aggravating factor includes wearing enclosed shoe gear and relieved with periodic debridement.   He voices no new pedal concerns on today's visit.   Current Outpatient Medications on File Prior to Visit  Medication Sig  . amLODipine (NORVASC) 10 MG tablet TAKE 1 TABLET BY MOUTH EVERY DAY  . aspirin EC 81 MG tablet Take 81 mg by mouth daily.  Marland Kitchen augmented betamethasone dipropionate (DIPROLENE-AF) 0.05 % cream Apply topically 2 (two) times daily.  Marland Kitchen azelastine (ASTELIN) 0.1 % nasal spray Place 2 sprays into both nostrils 2 (two) times daily. Use in each nostril as directed  . carvedilol (COREG) 25 MG tablet TAKE 1 TABLET TWICE A DAY WITH FOOD  . ciclopirox (LOPROX) 0.77 % cream Apply to both feet and between toes twice daily for 4 weeks.  . clobetasol ointment (TEMOVATE) 0.05 % apply to affected area twice a day UPON ITCHING AND IRRITATION(NOT TO FACE OR GROIN)  . diclofenac sodium (VOLTAREN) 1 % GEL Apply 4 g topically 4 (four) times daily. To affected joint.  . ferrous sulfate 325 (65 FE) MG tablet TAKE 1 TABLET BY MOUTH 2 (TWO) TIMES DAILY BEFORE A MEAL.  . fluticasone (FLONASE) 50 MCG/ACT nasal spray Place 2 sprays into both nostrils daily.  . furosemide (LASIX) 40 MG tablet Take 1 tablet (40 mg total) by mouth 2 (two) times daily.  . hydrocortisone 2.5 % cream Apply 1 application topically 2 (two) times daily.   Marland Kitchen KLOR-CON M20 20 MEQ tablet TAKE 3 TABLETS (60 MEQ TOTAL) BY MOUTH 3 (THREE) TIMES DAILY.  Marland Kitchen levocetirizine (XYZAL) 5 MG tablet TAKE 1 TABLET EVERY EVENING  . losartan (COZAAR) 100 MG tablet Take 1 tablet (100 mg total) by mouth daily.  . Multiple Vitamin (MULITIVITAMIN WITH MINERALS) TABS Take 1 tablet by mouth daily.  Marland Kitchen omeprazole (PRILOSEC) 40 MG capsule TAKE 1 CAPSULE BY MOUTH EVERY DAY  . timolol (TIMOPTIC) 0.5 %  ophthalmic solution INSTILL 1 DROP INTO BOTH EYES TWICE A DAY  . triamterene-hydrochlorothiazide (MAXZIDE-25) 37.5-25 MG tablet TAKE 1 TABLET BY MOUTH EVERY DAY   No current facility-administered medications on file prior to visit.      Allergies  Allergen Reactions  . Latex Hives    Latex gloves    Objective: 78 y.o. AAM, morbidly obese in NAD. AAO x 3.   Vascular Examination: Capillary refill time immediate x 10 digits.  Dorsalis pedis present b/l.  Posterior tibial pulses present b/l.  Digital hair absent b/l.  Skin temperature gradient WNL b/l.   Dermatological Examination: Skin with normal turgor, texture and tone b/l.  Toenails 1-5 b/l discolored, thick, dystrophic with subungual debris and pain with palpation to nailbeds due to thickness of nails.  Hyperkeratotic lesion submet head 5 b/l with tenderness to palpation. No edema, no erythema, no drainage, no flocculence.  Dry, flaky skin noted b/l feet.  Musculoskeletal: Muscle strength 5/5 b/l to all LE muscle groups.  No gross bony deformities b/l.  No pain, crepitus or joint limitation noted with ROM.   Neurological Examination: Protective sensation intact 5/5 with 10 gram monofilament bilaterally.  Vibratory sensation intact bilaterally.   Assessment: Painful onychomycosis toenails 1-5 b/l  Calluses submet head 5 b/l Xerosis cutis b/l NIDDM  Plan: 1. Toenails 1-5 b/l were debrided in length and girth without iatrogenic bleeding. 2. Calluses pared submetatarsal head 5 b/l  utilizing sterile scalpel blade without incident.   3. For xerosis, prescription for AmLactin lotion 12% was written.  Patient is to apply to both feet twice a day avoiding application in between the toes. 4. Patient to continue soft, supportive shoe gear. 5. Patient to report any pedal injuries to medical professional immediately. 6. Follow up 3 months.  7. Patient/POA to call should there be a concern in the interim.

## 2019-07-28 DIAGNOSIS — H04123 Dry eye syndrome of bilateral lacrimal glands: Secondary | ICD-10-CM | POA: Diagnosis not present

## 2019-07-28 DIAGNOSIS — H25813 Combined forms of age-related cataract, bilateral: Secondary | ICD-10-CM | POA: Diagnosis not present

## 2019-07-28 DIAGNOSIS — H401132 Primary open-angle glaucoma, bilateral, moderate stage: Secondary | ICD-10-CM | POA: Diagnosis not present

## 2019-07-28 DIAGNOSIS — H35371 Puckering of macula, right eye: Secondary | ICD-10-CM | POA: Diagnosis not present

## 2019-07-29 ENCOUNTER — Other Ambulatory Visit: Payer: Self-pay

## 2019-07-29 ENCOUNTER — Ambulatory Visit (INDEPENDENT_AMBULATORY_CARE_PROVIDER_SITE_OTHER): Payer: Medicare Other | Admitting: Family Medicine

## 2019-07-29 DIAGNOSIS — I2510 Atherosclerotic heart disease of native coronary artery without angina pectoris: Secondary | ICD-10-CM

## 2019-07-29 DIAGNOSIS — E785 Hyperlipidemia, unspecified: Secondary | ICD-10-CM

## 2019-07-29 DIAGNOSIS — I509 Heart failure, unspecified: Secondary | ICD-10-CM

## 2019-07-29 DIAGNOSIS — F410 Panic disorder [episodic paroxysmal anxiety] without agoraphobia: Secondary | ICD-10-CM

## 2019-07-29 DIAGNOSIS — R739 Hyperglycemia, unspecified: Secondary | ICD-10-CM | POA: Diagnosis not present

## 2019-07-29 DIAGNOSIS — I10 Essential (primary) hypertension: Secondary | ICD-10-CM | POA: Diagnosis not present

## 2019-07-29 MED ORDER — DIAZEPAM 5 MG PO TABS: 2.5000 mg | ORAL_TABLET | Freq: Every day | ORAL | 1 refills | Status: DC | PRN

## 2019-07-31 ENCOUNTER — Ambulatory Visit: Payer: Medicare Other | Admitting: Family Medicine

## 2019-08-04 DIAGNOSIS — F410 Panic disorder [episodic paroxysmal anxiety] without agoraphobia: Secondary | ICD-10-CM | POA: Insufficient documentation

## 2019-08-04 NOTE — Assessment & Plan Note (Signed)
hgba1c acceptable, minimize simple carbs. Increase exercise as tolerated.  

## 2019-08-04 NOTE — Assessment & Plan Note (Signed)
No recent exacerbation. Minimize sodium, stay as active as tolerated.

## 2019-08-04 NOTE — Assessment & Plan Note (Signed)
Patient will monitor and report any concerns, no changes to meds. Encouraged heart healthy diet such as the DASH diet and exercise as tolerated.

## 2019-08-04 NOTE — Progress Notes (Signed)
Virtual Visit via phone Note  I connected with David Parsons on 07/29/19 at 10:20 AM EST by a phone enabled telemedicine application and verified that I am speaking with the correct person using two identifiers.  Location: Patient: home Provider: home   I discussed the limitations of evaluation and management by telemedicine and the availability of in person appointments. The patient expressed understanding and agreed to proceed. CMA was able to get the patient set up on a phone visit after being unable to set up a video visit   Subjective:    Patient ID: David Parsons, male    DOB: 1940-12-18, 78 y.o.   MRN: [id number]  No chief complaint on file.   HPI Patient is in today for follow up on chronic medical concerns and discussion of worsening stressors. No recent febrile illness or hospitalizations. 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 He is having some anxiety attacks with shortness of breath, racing thoughts and more. No recent febrile illness or hospitalizations. Denies CP/HA/congestion/fevers/GI or GU c/o. Taking meds as prescribed  Past Medical History:  Diagnosis Date  . Aortic insufficiency     02/16/09 Chest CT:  The aortic root has a diameter measuring 4.6 cm, image 64 of the coronal series.  The ascending    thoracic aorta has a diameter of 3.8 cm, image 65 of the coronal series.  At the level of the aortic arch    the thoracic aorta has a maximum diameter of 3.2 cm.  The descending thoracic aorta has a maximum           diameter of 3.2 cm.  There is no evidence for aortic disse        . Aortic root enlargement (HCC)    The aortic root has a diameter measuring 4.6 cm, image 64 of the coronal series.  The ascending  thoracic aorta has a diameter of 3.8 cm, image 65 of the coronal series.  At the level of the aortic arch   the thoracic aorta has a maximum diameter of 3.2 cm.  The descending thoracic aorta has a maximum   diameter of 3.2 cm.   There is no evidence for aortic dissection.       Marland Kitchen CAD (coronary artery disease)   . Cardiomyopathy     01/2009 - Cath - nonobs. dzs.  EF 35%.  . CHF (congestive heart failure) ([place])   . Diabetes mellitus   . Dyslipidemia 01/14/2013  . Gallstones    s/p cholecystectomy    . GERD (gastroesophageal reflux disease)   . Glaucoma 10/05/2016  . Heart murmur   . Helicobacter pylori gastritis    (Tx Pylera 4/10)  . Hypertension   . Hypokalemia   . Iron deficiency anemia   . Obesity, Class III, BMI 40-49.9 (morbid obesity) ([place])   . Onychomycosis 03/09/2015  . PONV (postoperative nausea and vomiting)   . 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 Sleep apnea    occasionally wears CPAP at night    Past Surgical History:  Procedure Laterality Date  . ADRENALECTOMY    . BENTALL PROCEDURE  12/28/2011   Procedure: BENTALL PROCEDURE;  Surgeon: Rexene Alberts, MD;  Location:  [facility];  Service: Open Heart Surgery;  Laterality: N/A;  . CARDIAC CATHETERIZATION     2013  . CARDIAC VALVE REPLACEMENT  12/28/2011   Bentall with tissue valve  . CHOLECYSTECTOMY    . COLONOSCOPY  09/25/2011  . COLONOSCOPY W/ BIOPSIES AND POLYPECTOMY  11/04/2008   colon polyps, diverticulosis   . CORONARY ARTERY BYPASS GRAFT  12/28/2011   Procedure: CORONARY ARTERY BYPASS GRAFTING (CABG);  Surgeon: Rexene Alberts, MD;  Location: [facility];  Service: Open Heart Surgery;  Laterality: N/A;  cabg x 1  . LEFT AND RIGHT HEART CATHETERIZATION WITH CORONARY ANGIOGRAM N/A 11/22/2011   Procedure: LEFT AND RIGHT HEART CATHETERIZATION WITH CORONARY ANGIOGRAM;  Surgeon: Burnell Blanks, MD;  Location: [facility] CATH LAB;  Service: Cardiovascular;  Laterality: N/A;  . MULTIPLE TOOTH EXTRACTIONS    .  TEE WITHOUT CARDIOVERSION  11/22/2011   Procedure: TRANSESOPHAGEAL ECHOCARDIOGRAM (TEE);  Surgeon: Larey Dresser, MD;  Location: [facility];  Service: Cardiovascular;  Laterality: N/A;  . UPPER GASTROINTESTINAL ENDOSCOPY  11/04/2008   w/bx, H pylori gastritis  . UPPER GASTROINTESTINAL ENDOSCOPY  09/25/2011    Family History  Problem Relation Age of Onset  . Breast cancer Sister   . Diabetes Sister   . Colon cancer Brother 70       at least 102  . Diabetes Brother   . Cancer Brother        colon, prostate  . Prostate cancer Brother   . Diabetes Brother   . Diabetes Sister   . Cancer Sister        breast, colon, melanoma  . Heart disease Sister   . Hypertension Mother   . Arthritis Mother   . Kidney failure Sister   . Colon cancer Sister   . Breast cancer Sister   . Kidney disease Sister        s/p transplant  . Hypertension Sister   . Diabetes Sister   . Stroke Father   . Hypertension Father     Social History   Socioeconomic History  . Marital status: Married    Spouse name: [name]  . Number of children: 0  . Years of education: Not on file  . 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 Smoking status: Never Smoker  . Smokeless tobacco: Never Used  Substance and Sexual Activity  . Alcohol use: No  . Drug use: No  . Sexual activity: Not Currently    Comment: lives with wife, pastros 2 churches  Other Topics Concern  . Not on file  Social History Narrative   The patient is married, lives with his wife in  [place].  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  He  lives a very sedentary lifestyle.  He is a nonsmoker.  He denies alcohol consumption.    Social Determinants of Health   Financial Resource Strain:   . Difficulty of Paying Living Expenses: Not on file  Food Insecurity:   . Worried About Charity fundraiser in the Last Year: Not  on file  . Ran Out of Food in the Last Year: Not on file  Transportation Needs:   . Lack of Transportation (Medical): Not on file  . Lack of Transportation (Non-Medical): Not on file  Physical Activity:   . Days of Exercise per Week: Not on  file  . Minutes of Exercise per Session: Not on file  Stress:   . Feeling of Stress : Not on file  Social Connections:   . Frequency of Communication with Friends and Family: Not on file  . Frequency of Social Gatherings with Friends and Family: Not on file  . Attends Religious Services: Not on file  . Active Member of Clubs or Organizations: Not on file  . Attends Archivist Meetings: Not on file  . Marital Status: Not on file  Intimate Partner Violence:   . Fear of Current or Ex-Partner: Not on file  . Emotionally Abused: Not on file  . Physically Abused: Not on file  . Sexually Abused: Not on file    Outpatient Medications Prior to Visit  Medication Sig Dispense Refill  . amLODipine (NORVASC) 10 MG tablet TAKE 1 TABLET BY MOUTH EVERY DAY 90 tablet 0  . ammonium lactate (AMLACTIN) 12 % lotion Apply 1 application topically 2 (two) times daily. 400 g 4  . aspirin EC 81 MG tablet Take 81 mg by mouth daily.    Marland Kitchen augmented betamethasone dipropionate (DIPROLENE-AF) 0.05 % cream Apply topically 2 (two) times daily. 15 g 0  . azelastine (ASTELIN) 0.1 % nasal spray Place 2 sprays into both nostrils 2 (two) times daily. Use in each nostril as directed 30 mL 2  . carvedilol (COREG) 25 MG tablet TAKE 1 TABLET TWICE A DAY WITH FOOD 180 tablet 0  . ciclopirox (LOPROX) 0.77 % cream Apply to both feet and between toes twice daily for 4 weeks. 30 g 1  . clobetasol ointment (TEMOVATE) 0.05 % apply to affected area twice a day UPON ITCHING AND IRRITATION(NOT TO FACE OR GROIN)  0  . diclofenac sodium (VOLTAREN) 1 % GEL Apply 4 g topically 4 (four) times daily. To affected joint. 100 g 11  . ferrous sulfate 325 (65 FE) MG tablet TAKE 1 TABLET BY MOUTH 2  (TWO) TIMES DAILY BEFORE A MEAL. 180 tablet 0  . fluticasone (FLONASE) 50 MCG/ACT nasal spray Place 2 sprays into both nostrils daily. 16 g 1  . furosemide (LASIX) 40 MG tablet Take 1 tablet (40 mg total) by mouth 2 (two) times daily. 60 tablet 12  . hydrocortisone 2.5 % cream Apply 1 application topically 2 (two) times daily.   0  . KLOR-CON M20 20 MEQ tablet TAKE 3 TABLETS (60 MEQ TOTAL) BY MOUTH 3 (THREE) TIMES DAILY. 270 tablet 1  . levocetirizine (XYZAL) 5 MG tablet TAKE 1 TABLET EVERY EVENING 30 tablet 0  . losartan (COZAAR) 100 MG tablet Take 1 tablet (100 mg total) by mouth daily. 90 tablet 2  . Multiple Vitamin (MULITIVITAMIN WITH MINERALS) TABS Take 1 tablet by mouth daily.    Marland Kitchen omeprazole (PRILOSEC) 40 MG capsule TAKE 1 CAPSULE BY MOUTH EVERY DAY 90 capsule 3  . timolol (TIMOPTIC) 0.5 % ophthalmic solution INSTILL 1 DROP INTO BOTH EYES TWICE A DAY    . triamterene-hydrochlorothiazide (MAXZIDE-25) 37.5-25 MG tablet TAKE 1 TABLET BY MOUTH EVERY DAY 90 tablet 1   No facility-administered medications prior to visit.    Allergies  Allergen Reactions  . Latex Hives    Latex gloves     Review of Systems  Constitutional: Positive for malaise/fatigue. Negative for fever.  HENT: Negative for congestion.   Eyes: Negative for blurred vision.  Respiratory: Positive for shortness of breath.   Cardiovascular: Negative for chest pain, palpitations and leg swelling.  Gastrointestinal: Negative for abdominal  pain, blood in stool and nausea.  Genitourinary: Negative for dysuria and frequency.  Musculoskeletal: Negative for falls.  Skin: Negative for rash.  Neurological: Negative for dizziness, loss of consciousness and headaches.  Endo/Heme/Allergies: Negative for environmental allergies.  Psychiatric/Behavioral: Negative for depression. The patient is nervous/anxious.        Objective:    Physical Exam Unable to obtain via phone  There were no vitals taken for this visit. Wt  Readings from Last 3 Encounters:  04/17/19 (!) 330 lb (149.7 kg)  08/12/18 (!) 335 lb 3.2 oz (152 kg)  05/14/18 (!) 334 lb 12.8 oz (151.9 kg)    Diabetic Foot Exam - Simple   No data filed     Lab Results  Component Value Date   WBC 9.1 04/22/2019   HGB 14.2 04/22/2019   HCT 42.4 04/22/2019   PLT 280.0 04/22/2019   GLUCOSE 116 (H) 04/22/2019   CHOL 156 04/22/2019   TRIG 119.0 04/22/2019   HDL 35.30 (L) 04/22/2019   LDLCALC 96 04/22/2019   ALT 14 04/22/2019   AST 14 04/22/2019   NA 140 04/22/2019   K 3.6 04/22/2019   CL 99 04/22/2019   CREATININE 0.88 04/22/2019   BUN 11 04/22/2019   CO2 30 04/22/2019   TSH 2.36 04/22/2019   PSA 7.32 (H) 09/07/2010   INR 1.21 01/02/2012   HGBA1C 5.6 04/22/2019    Lab Results  Component Value Date   TSH 2.36 04/22/2019   Lab Results  Component Value Date   WBC 9.1 04/22/2019   HGB 14.2 04/22/2019   HCT 42.4 04/22/2019   MCV 89.2 04/22/2019   PLT 280.0 04/22/2019   Lab Results  Component Value Date   NA 140 04/22/2019   K 3.6 04/22/2019   CO2 30 04/22/2019   GLUCOSE 116 (H) 04/22/2019   BUN 11 04/22/2019   CREATININE 0.88 04/22/2019   BILITOT 1.1 04/22/2019   ALKPHOS 71 04/22/2019   AST 14 04/22/2019   ALT 14 04/22/2019   PROT 6.8 04/22/2019   ALBUMIN 4.0 04/22/2019   CALCIUM 9.2 04/22/2019   GFR 101.56 04/22/2019   Lab Results  Component Value Date   CHOL 156 04/22/2019   Lab Results  Component Value Date   HDL 35.30 (L) 04/22/2019   Lab Results  Component Value Date   LDLCALC 96 04/22/2019   Lab Results  Component Value Date   TRIG 119.0 04/22/2019   Lab Results  Component Value Date   CHOLHDL 4 04/22/2019   Lab Results  Component Value Date   HGBA1C 5.6 04/22/2019       Assessment & Plan:   Problem List Items Addressed This Visit    Essential hypertension    Patient will monitor and report any concerns, no changes to meds. Encouraged heart healthy diet such as the DASH diet and exercise as  tolerated.       Congestive heart failure (HCC)    No recent exacerbation. Minimize sodium, stay as active as tolerated.       Hyperglycemia    hgba1c acceptable, minimize simple carbs. Increase exercise as tolerated.       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 He has used benzodiazepine sparingly in the distant past and  is currently describing some panic attacks recently. Is allowed a small amount of Diazepam to use prn. If worsens he should give [place] a call to discuss.       Relevant Medications   diazepam (VALIUM) 5 MG tablet      I am having Elna Breslow. Vickrey start on diazepam. I am also having him maintain his multivitamin with minerals, aspirin EC, clobetasol ointment, hydrocortisone, fluticasone, azelastine, levocetirizine, augmented betamethasone dipropionate, diclofenac sodium, furosemide, timolol, ciclopirox, triamterene-hydrochlorothiazide, losartan, Klor-Con M20, omeprazole, ferrous sulfate, carvedilol, amLODipine, and ammonium lactate.  Meds ordered this encounter  Medications  . diazepam (VALIUM) 5 MG tablet    Sig: Take 0.5-2 tablets (2.5-10 mg total) by mouth daily as needed for anxiety.    Dispense:  10 tablet    Refill:  1     I discussed the assessment and treatment plan with the patient. The patient was provided an opportunity to ask questions and all were answered. The patient agreed with the plan and demonstrated an understanding of the instructions.   The patient was advised to call back or seek an in-person evaluation if the symptoms worsen or if the condition fails to improve as anticipated.  I provided 25 minutes of non-face-to-face time during this encounter.   [name], MD

## 2019-08-04 NOTE — Assessment & Plan Note (Signed)
Encouraged heart healthy diet, increase exercise, avoid trans fats, consider a krill oil cap daily 

## 2019-08-04 NOTE — Assessment & Plan Note (Signed)
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 He has used benzodiazepine sparingly in the distant past and is currently describing some panic attacks recently. Is allowed a small amount of Diazepam to use prn. If worsens he should give [place] a call to discuss.

## 2019-08-12 NOTE — Progress Notes (Signed)
Virtual Visit via Video Note  I connected with patient on 08/18/19 at  8:00 AM EST by audio enabled telemedicine application and verified that I am speaking with the correct person using two identifiers.   THIS ENCOUNTER IS A VIRTUAL VISIT DUE TO COVID-19 - PATIENT WAS NOT SEEN IN THE OFFICE. PATIENT HAS CONSENTED TO VIRTUAL VISIT / TELEMEDICINE VISIT   Location of patient: home  Location of provider: office  I discussed the limitations of evaluation and management by telemedicine and the availability of in person appointments. The patient expressed understanding and agreed to proceed.   Subjective:   GERRED ARNTZEN is a 78 y.o. male who presents for Medicare Annual/Subsequent preventive examination.  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   Review of Systems:  Home Safety/Smoke Alarms: Feels safe in home. Smoke alarms in place.  Lives w/ wife in 1 story home. Walk-in shower.   Male:   CCS-  09/25/11   PSA-  Lab Results  Component Value Date   PSA 7.32 (H) 09/07/2010       Objective:    Vitals: Unable to assess. This visit is enabled though telemedicine due to Covid 19.   Advanced Directives 08/18/2019 08/12/2018 08/09/2017 08/01/2016 04/22/2015 04/20/2014 12/28/2011  Does Patient Have a Medical Advance Directive? No Yes No No No No Patient does not have advance directive  Type of Advance Directive - [place];Living will - - - - -  Does patient want to make changes to medical advance directive? - - - Yes (MAU/Ambulatory/Procedural Areas - Information given) - - -  Copy of [place] in Chart? - Yes - validated most recent copy scanned in chart (See row information) - - - - -  Would patient like information on creating a medical advance directive? No - Patient declined - Yes (MAU/Ambulatory/Procedural Areas - Information given) - Yes - Educational materials given Yes - Scientist, clinical (histocompatibility and immunogenetics) given -  Pre-existing out of  facility DNR order (yellow form or pink MOST form) - - - - - - No    Tobacco Social History   Tobacco Use  Smoking Status Never Smoker  Smokeless Tobacco Never Used     Counseling given: Not Answered   Clinical Intake: Pain : No/denies pain     Past Medical History:  Diagnosis Date  . Aortic insufficiency     02/16/09 Chest CT:  The aortic root has a diameter measuring 4.6 cm, image 64 of the coronal series.  The ascending    thoracic aorta has a diameter of 3.8 cm, image 65 of the coronal series.  At the level of the aortic arch    the thoracic aorta has a maximum diameter of 3.2 cm.  The descending thoracic aorta has a maximum           diameter of 3.2 cm.  There is no evidence for aortic disse        . Aortic root enlargement (HCC)    The aortic root has a diameter measuring 4.6 cm, image 64 of the coronal series.  The ascending  thoracic aorta has a diameter of 3.8 cm, image 65 of the coronal series.  At the level of the aortic arch   the thoracic aorta has a maximum diameter of 3.2 cm.  The descending thoracic aorta has a maximum   diameter of 3.2 cm.  There is no evidence for aortic dissection.       Marland Kitchen CAD (  coronary artery disease)   . Cardiomyopathy     01/2009 - Cath - nonobs. dzs.  EF 35%.  . CHF (congestive heart failure) ([place])   . Diabetes mellitus   . Dyslipidemia 01/14/2013  . Gallstones    s/p cholecystectomy    . GERD (gastroesophageal reflux disease)   . Glaucoma 10/05/2016  . Heart murmur   . Helicobacter pylori gastritis    ([place] 4/10)  . Hypertension   . Hypokalemia   . Iron deficiency anemia   . Obesity, Class III, BMI 40-49.9 (morbid obesity) ([place])   . Onychomycosis 03/09/2015  . PONV (postoperative nausea and vomiting)   . 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 Sleep apnea    occasionally wears CPAP at night   Past Surgical History:  Procedure Laterality Date  . ADRENALECTOMY    . BENTALL PROCEDURE  12/28/2011   Procedure: BENTALL PROCEDURE;  Surgeon: Rexene Alberts, MD;  Location: [facility];  Service: Open Heart Surgery;  Laterality: N/A;  . CARDIAC CATHETERIZATION     2013  . CARDIAC VALVE REPLACEMENT  12/28/2011   Bentall with tissue valve  . CHOLECYSTECTOMY    . COLONOSCOPY  09/25/2011  . COLONOSCOPY W/ BIOPSIES AND POLYPECTOMY  11/04/2008   colon polyps, diverticulosis   . CORONARY ARTERY BYPASS GRAFT  12/28/2011   Procedure: CORONARY ARTERY BYPASS GRAFTING (CABG);  Surgeon: Rexene Alberts, MD;  Location: [facility];  Service: Open Heart Surgery;  Laterality: N/A;  cabg x 1  . LEFT AND RIGHT HEART CATHETERIZATION WITH CORONARY ANGIOGRAM N/A 11/22/2011   Procedure: LEFT AND RIGHT HEART CATHETERIZATION WITH CORONARY ANGIOGRAM;  Surgeon: Burnell Blanks, MD;  [facility] C[facility] LAB;  Service: Cardiovascular;  Laterality: N/A;  . MULTIPLE TOOTH EXTRACTIONS    . TEE WITHOUT CARDIOVERSION  11/22/2011   Procedure: TRANSESOPHAGEAL ECHOCARDIOGRAM (TEE);  Surgeon: Larey Dresser, MD;  Location: [facility];  Service: Cardiovascular;  Laterality: N/A;  . UPPER GASTROINTESTINAL ENDOSCOPY  11/04/2008   w/bx, H pylori gastritis  . UPPER GASTROINTESTINAL ENDOSCOPY  09/25/2011   Family History  Problem Relation Age of Onset  . Breast cancer Sister   . Diabetes Sister   . Colon cancer Brother 70       at least 91  . Diabetes Brother   . Cancer Brother        colon, prostate  . Prostate cancer Brother   . Diabetes Brother   . Diabetes Sister   . Cancer Sister        breast, colon, melanoma  . Heart disease Sister   . Hypertension Mother   . Arthritis Mother   . Kidney failure Sister   . Colon cancer Sister   . Breast cancer Sister   . Kidney disease Sister        s/p transplant  .  Hypertension Sister   . Diabetes Sister   . Stroke Father   . Hypertension Father    Social History   Socioeconomic History  . Marital status: Married    Spouse name: [name]  . Number of children: 0  . Years of education: Not on file  . 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 Smoking status: Never Smoker  . Smokeless tobacco: Never Used  Substance and Sexual Activity  . Alcohol use: No  . Drug use: No  . Sexual activity: Not Currently    Comment: lives with wife, pastros 2 churches  Other Topics Concern  . Not on file  Social History Narrative   The patient is married, lives with his wife in  [place].  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  He  lives a very sedentary lifestyle.  He is a nonsmoker.  He denies alcohol consumption.    Social Determinants of Health   Financial Resource Strain:   . Difficulty of Paying Living Expenses: Not on file  Food Insecurity:   . Worried About Charity fundraiser in the Last Year: Not on file  . Ran Out of Food in the Last Year: Not on file  Transportation Needs:   . Lack of Transportation (Medical): Not on file  . Lack of Transportation (Non-Medical): Not on file  Physical Activity:   . Days of Exercise per Week: Not on file  . Minutes of Exercise per Session: Not on file  Stress:   . Feeling of Stress : Not on file  Social Connections:   . Frequency of Communication with Friends and Family: Not on file  . Frequency of Social Gatherings with Friends and Family: Not on file  . Attends Religious Services: Not on file  . Active Member of Clubs or Organizations: Not on file  . Attends Archivist Meetings: Not on file  . Marital Status: Not on file    Outpatient Encounter Medications as of 08/18/2019  Medication Sig  . amLODipine (NORVASC) 10 MG tablet TAKE 1 TABLET BY MOUTH EVERY DAY  .  ammonium lactate (AMLACTIN) 12 % lotion Apply 1 application topically 2 (two) times daily.  Marland Kitchen aspirin EC 81 MG tablet Take 81 mg by mouth daily.  Marland Kitchen augmented betamethasone dipropionate (DIPROLENE-AF) 0.05 % cream Apply topically 2 (two) times daily.  Marland Kitchen azelastine (ASTELIN) 0.1 % nasal spray Place 2 sprays into both nostrils 2 (two) times daily. Use in each nostril as directed  . carvedilol (COREG) 25 MG tablet TAKE 1 TABLET TWICE A DAY WITH FOOD  . ciclopirox (LOPROX) 0.77 % cream Apply to both feet and between toes twice daily for 4 weeks.  . clobetasol ointment (TEMOVATE) 0.05 % apply to affected area twice a day UPON ITCHING AND IRRITATION(NOT TO FACE OR GROIN)  . diazepam (VALIUM) 5 MG tablet Take 0.5-2 tablets (2.5-10 mg total) by mouth daily as needed for anxiety.  . diclofenac sodium (VOLTAREN) 1 % GEL Apply 4 g topically 4 (four) times daily. To affected joint.  . ferrous sulfate 325 (65 FE) MG tablet TAKE 1 TABLET BY MOUTH 2 (TWO) TIMES DAILY BEFORE A MEAL.  . fluticasone (FLONASE) 50 MCG/ACT nasal spray Place 2 sprays into both nostrils daily.  . furosemide (LASIX) 40 MG tablet Take 1 tablet (40 mg total) by mouth 2 (two) times daily.  . hydrocortisone 2.5 % cream Apply 1 application topically 2 (two) times daily.   Marland Kitchen KLOR-CON M20 20 MEQ tablet TAKE 3 TABLETS (60 MEQ TOTAL) BY MOUTH 3 (THREE) TIMES DAILY.  Marland Kitchen losartan (COZAAR) 100 MG tablet Take 1 tablet (100 mg total) by mouth daily.  . Multiple Vitamin (MULITIVITAMIN WITH MINERALS) TABS Take 1 tablet by mouth daily.  Marland Kitchen  omeprazole (PRILOSEC) 40 MG capsule TAKE 1 CAPSULE BY MOUTH EVERY DAY  . timolol (TIMOPTIC) 0.5 % ophthalmic solution INSTILL 1 DROP INTO BOTH EYES TWICE A DAY  . triamterene-hydrochlorothiazide (MAXZIDE-25) 37.5-25 MG tablet TAKE 1 TABLET BY MOUTH EVERY DAY  . levocetirizine (XYZAL) 5 MG tablet TAKE 1 TABLET EVERY EVENING (Patient not taking: Reported on 08/18/2019)   No facility-administered encounter medications on  file as of 08/18/2019.    Activities of Daily Living In your present state of health, do you have any difficulty performing the following activities: 08/18/2019  Hearing? N  Vision? N  Difficulty concentrating or making decisions? N  Walking or climbing stairs? N  Dressing or bathing? N  Doing errands, shopping? N  Preparing Food and eating ? N  Using the Toilet? N  In the past six months, have you accidently leaked urine? N  Do you have problems with loss of bowel control? N  Managing your Medications? N  Managing your Finances? N  Housekeeping or managing your Housekeeping? N  Some recent data might be hidden    Patient Care Team: [name], MD as PCP - General (Family Medicine) Burnell Blanks, MD as Consulting Physician (Cardiology) Gatha Mayer, MD as Consulting Physician (Gastroenterology) Druscilla Brownie, MD as Consulting Physician (Dermatology) Calvert Cantor, MD as Consulting Physician (Ophthalmology) Carolan Clines, MD (Inactive) as Consulting Physician (Urology) Gardiner Barefoot, DPM as Consulting Physician (Podiatry)   Assessment:   This is a routine wellness examination for [employer]. Physical assessment deferred to PCP.  Exercise Activities and Dietary recommendations Current Exercise Habits: Home exercise routine, Type of exercise: walking, Time (Minutes): 30, Frequency (Times/Week): 5, Weekly Exercise (Minutes/Week): 150, Intensity: Mild, Exercise limited by: None identified Diet (meal preparation, eat out, water intake, caffeinated beverages, dairy products, fruits and vegetables): well balanced      Goals    . Increase water intake    . Weight (lb) < 300 lb (136.1 kg)       Fall Risk Fall Risk  08/18/2019 07/11/2018 08/09/2017 08/01/2016 04/22/2015  Falls in the past year? 0 1 No No No  Comment - Emmi Telephone Survey: data to providers prior to load - - -  Number falls in past yr: - 1 - - -  Comment - Emmi Telephone Survey Actual  Response = 1 - - -  Injury with Fall? - 1 - - -  Follow up Education provided;Falls prevention discussed - - - -    Depression Screen PHQ 2/9 Scores 08/18/2019 08/09/2017 08/01/2016 04/22/2015  PHQ - 2 Score 0 0 0 0    Cognitive Function   MMSE - Mini Mental State Exam 08/09/2017 08/01/2016  Orientation to time 5 5  Orientation to Place 4 5  Registration 3 3  Attention/ Calculation 5 3  Recall 2 2  Language- name 2 objects 2 2  Language- repeat 1 1  Language- follow 3 step command 3 3  Language- read & follow direction 1 1  Write a sentence 1 1  Copy design 1 1  Total score 28 27     6CIT Screen 08/18/2019  What Year? 0 points  What month? 0 points  What time? 0 points  Count back from 20 0 points  Months in reverse 0 points  Repeat phrase 0 points  Total Score 0    Immunization History  Administered Date(s) Administered  . Fluad Quad(high Dose 65+) 04/22/2019  . Influenza Split 05/15/2011, 05/22/2012  . Influenza Whole 06/07/2009, 05/11/2010  .  Influenza, High Dose Seasonal PF 04/22/2015, 05/07/2017, 05/14/2018  . Influenza,inj,Quad PF,6+ Mos 05/09/2013, 04/20/2014  . Influenza-Unspecified 05/17/2016  . Pneumococcal Conjugate-13 05/09/2013  . Pneumococcal Polysaccharide-23 04/16/2006, 04/22/2015  . Td 04/14/2008    Screening Tests Health Maintenance  Topic Date Due  . TETANUS/TDAP  04/14/2018  . INFLUENZA VACCINE  Completed  . PNA vac Low Risk Adult  Completed       Plan:   See you next year!  Continue to eat heart healthy diet (full of fruits, vegetables, whole grains, lean protein, water--limit salt, fat, and sugar intake) and increase physical activity as tolerated.  Continue doing brain stimulating activities (puzzles, reading, adult coloring books, staying active) to keep memory sharp.   Bring a copy of your living will and/or healthcare power of attorney to your next office visit.   I have personally reviewed and noted the following in the  patient's chart:   . Medical and social history . Use of alcohol, tobacco or illicit drugs  . Current medications and supplements . Functional ability and status . Nutritional status . Physical activity . Advanced directives . List of other physicians . Hospitalizations, surgeries, and ER visits in previous 12 months . Vitals . Screenings to include cognitive, depression, and falls . Referrals and appointments  In addition, I have reviewed and discussed with patient certain preventive protocols, quality metrics, and best practice recommendations. A written personalized care plan for preventive services as well as general preventive health recommendations were provided to patient.     Shela Nevin, [place]  08/18/2019

## 2019-08-18 ENCOUNTER — Encounter: Payer: Self-pay | Admitting: *Deleted

## 2019-08-18 ENCOUNTER — Other Ambulatory Visit: Payer: Self-pay

## 2019-08-18 ENCOUNTER — Ambulatory Visit (INDEPENDENT_AMBULATORY_CARE_PROVIDER_SITE_OTHER): Payer: Medicare Other | Admitting: *Deleted

## 2019-08-18 DIAGNOSIS — Z0000 Encounter for general adult medical examination without abnormal findings: Secondary | ICD-10-CM | POA: Diagnosis not present

## 2019-08-18 NOTE — Patient Instructions (Signed)
See you next year!  Continue to eat heart healthy diet (full of fruits, vegetables, whole grains, lean protein, water--limit salt, fat, and sugar intake) and increase physical activity as tolerated.  Continue doing brain stimulating activities (puzzles, reading, adult coloring books, staying active) to keep memory sharp.   Bring a copy of your living will and/or healthcare power of attorney to your next office visit.   David Parsons , Thank you for taking time to come for your Medicare Wellness Visit. I appreciate your ongoing commitment to your health goals. Please review the following plan we discussed and let me know if I can assist you in the future.   These are the goals we discussed: Goals    . Increase water intake    . Weight (lb) < 300 lb (136.1 kg)       This is a list of the screening recommended for you and due dates:  Health Maintenance  Topic Date Due  . Tetanus Vaccine  04/14/2018  . Flu Shot  Completed  . Pneumonia vaccines  Completed    Preventive Care 54 Years and Older, Male Preventive care refers to lifestyle choices and visits with your health care provider that can promote health and wellness. This includes:  A yearly physical exam. This is also called an annual well check.  Regular dental and eye exams.  Immunizations.  Screening for certain conditions.  Healthy lifestyle choices, such as diet and exercise. What can I expect for my preventive care visit? Physical exam Your health care provider will check:  Height and weight. These may be used to calculate body mass index (BMI), which is a measurement that tells if you are at a healthy weight.  Heart rate and blood pressure.  Your skin for abnormal spots. Counseling Your health care provider may ask you questions about:  Alcohol, tobacco, and drug use.  Emotional well-being.  Home and relationship well-being.  Sexual activity.  Eating habits.  History of falls.  Memory and ability to  understand (cognition).  Work and work Statistician. What immunizations do I need?  Influenza (flu) vaccine  This is recommended every year. Tetanus, diphtheria, and pertussis (Tdap) vaccine  You may need a Td booster every 10 years. Varicella (chickenpox) vaccine  You may need this vaccine if you have not already been vaccinated. Zoster (shingles) vaccine  You may need this after age 46. Pneumococcal conjugate (PCV13) vaccine  One dose is recommended after age 73. Pneumococcal polysaccharide (PPSV23) vaccine  One dose is recommended after age 31. Measles, mumps, and rubella (MMR) vaccine  You may need at least one dose of MMR if you were born in 1957 or later. You may also need a second dose. Meningococcal conjugate (MenACWY) vaccine  You may need this if you have certain conditions. Hepatitis A vaccine  You may need this if you have certain conditions or if you travel or work in places where you may be exposed to hepatitis A. Hepatitis B vaccine  You may need this if you have certain conditions or if you travel or work in places where you may be exposed to hepatitis B. Haemophilus influenzae type b (Hib) vaccine  You may need this if you have certain conditions. You may receive vaccines as individual doses or as more than one vaccine together in one shot (combination vaccines). Talk with your health care provider about the risks and benefits of combination vaccines. What tests do I need? Blood tests  Lipid and cholesterol levels. These may  be checked every 5 years, or more frequently depending on your overall health.  Hepatitis C test.  Hepatitis B test. Screening  Lung cancer screening. You may have this screening every year starting at age 88 if you have a 30-pack-year history of smoking and currently smoke or have quit within the past 15 years.  Colorectal cancer screening. All adults should have this screening starting at age 20 and continuing until age 59.  Your health care provider may recommend screening at age 81 if you are at increased risk. You will have tests every 1-10 years, depending on your results and the type of screening test.  Prostate cancer screening. Recommendations will vary depending on your family history and other risks.  Diabetes screening. This is done by checking your blood sugar (glucose) after you have not eaten for a while (fasting). You may have this done every 1-3 years.  Abdominal aortic aneurysm (AAA) screening. You may need this if you are a current or former smoker.  Sexually transmitted disease (STD) testing. Follow these instructions at home: Eating and drinking  Eat a diet that includes fresh fruits and vegetables, whole grains, lean protein, and low-fat dairy products. Limit your intake of foods with high amounts of sugar, saturated fats, and salt.  Take vitamin and mineral supplements as recommended by your health care provider.  Do not drink alcohol if your health care provider tells you not to drink.  If you drink alcohol: ? Limit how much you have to 0-2 drinks a day. ? Be aware of how much alcohol is in your drink. In the [place] one drink equals one 12 oz bottle of beer (355 mL), one 5 oz glass of wine (148 mL), or one 1 oz glass of hard liquor (44 mL). Lifestyle  Take daily care of your teeth and gums.  Stay active. Exercise for at least 30 minutes on 5 or more days each week.  Do not use any products that contain nicotine or tobacco, such as cigarettes, e-cigarettes, and chewing tobacco. If you need help quitting, ask your health care provider.  If you are sexually active, practice safe sex. Use a condom or other form of protection to prevent STIs (sexually transmitted infections).  Talk with your health care provider about taking a low-dose aspirin or statin. What's next?  Visit your health care provider once a year for a well check visit.  Ask your health care provider how often you  should have your eyes and teeth checked.  Stay up to date on all vaccines. This information is not intended to replace advice given to you by your health care provider. Make sure you discuss any questions you have with your health care provider. Document Released: 09/03/2015 Document Revised: 08/01/2018 Document Reviewed: 08/01/2018 Elsevier Patient Education  2020 [employer].

## 2019-08-24 ENCOUNTER — Other Ambulatory Visit: Payer: Self-pay | Admitting: Family Medicine

## 2019-08-25 ENCOUNTER — Other Ambulatory Visit: Payer: Self-pay | Admitting: Family Medicine

## 2019-08-29 ENCOUNTER — Other Ambulatory Visit: Payer: Self-pay

## 2019-08-29 ENCOUNTER — Ambulatory Visit (INDEPENDENT_AMBULATORY_CARE_PROVIDER_SITE_OTHER): Payer: Medicare Other | Admitting: Family Medicine

## 2019-08-29 DIAGNOSIS — I10 Essential (primary) hypertension: Secondary | ICD-10-CM

## 2019-08-29 DIAGNOSIS — E785 Hyperlipidemia, unspecified: Secondary | ICD-10-CM

## 2019-08-29 DIAGNOSIS — I509 Heart failure, unspecified: Secondary | ICD-10-CM

## 2019-08-29 DIAGNOSIS — R739 Hyperglycemia, unspecified: Secondary | ICD-10-CM

## 2019-08-29 DIAGNOSIS — F410 Panic disorder [episodic paroxysmal anxiety] without agoraphobia: Secondary | ICD-10-CM

## 2019-08-29 NOTE — Assessment & Plan Note (Signed)
He has been doing better and no recent panic attacks. 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 Can use lorazepam prn if things worsen

## 2019-08-29 NOTE — Assessment & Plan Note (Signed)
No recent exacerbation, no change in medications at this time.

## 2019-08-29 NOTE — Assessment & Plan Note (Signed)
hgba1c acceptable, minimize simple carbs. Increase exercise as tolerated.  

## 2019-08-29 NOTE — Progress Notes (Signed)
Virtual Visit via home Note  I connected with [name] on 08/29/19 at  9:20 AM EST by a home enabled telemedicine application and verified that I am speaking with the correct person using two identifiers.  Location: Patient: home Provider: home   I discussed the limitations of evaluation and management by telemedicine and the availability of in person appointments. The patient expressed understanding and agreed to proceed. David Parsons, CMA was able to get the patient set up on a visit, phone after being unable to set up video visit   Subjective:    Patient ID: David Parsons, male    DOB: 04/23/1941, 80 y.o.   MRN: [id number]  No chief complaint on file.   HPI Patient is in today for follow up on chronic medical concerns including anxiety, hypertension and more. No recent febrile illness or hospitalizations. 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 No recent febrile illness or hospitalizations. Denies CP/palp/SOB/HA/congestion/fevers/GI or GU c/o. Taking meds as prescribed  Past Medical History:  Diagnosis Date  . Aortic insufficiency     02/16/09 Chest CT:  The aortic root has a diameter measuring 4.6 cm, image 64 of the coronal series.  The ascending    thoracic aorta has a diameter of 3.8 cm, image 65 of the coronal series.  At the level of the aortic arch    the thoracic aorta has a maximum diameter of 3.2 cm.  The descending thoracic aorta has a maximum           diameter of 3.2 cm.  There is no evidence for aortic disse        . Aortic root enlargement (HCC)    The aortic root has a diameter measuring 4.6 cm, image 64 of the coronal series.  The ascending  thoracic aorta has a diameter of 3.8 cm, image 65 of the coronal series.  At the level of the aortic arch   the thoracic aorta has a maximum diameter of 3.2 cm.  The descending thoracic aorta has a maximum   diameter of 3.2 cm.  There is no  evidence for aortic dissection.       Marland Kitchen CAD (coronary artery disease)   . Cardiomyopathy     01/2009 - Cath - nonobs. dzs.  EF 35%.  . CHF (congestive heart failure) ([place])   . Diabetes mellitus   . Dyslipidemia 01/14/2013  . Gallstones    s/p cholecystectomy    . GERD (gastroesophageal reflux disease)   . Glaucoma 10/05/2016  . Heart murmur   . Helicobacter pylori gastritis    ([place] 4/10)  . Hypertension   . Hypokalemia   . Iron deficiency anemia   . Obesity, Class III, BMI 40-49.9 (morbid obesity) ([place])   . Onychomycosis 03/09/2015  . PONV (postoperative nausea and vomiting)   . 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 Sleep apnea    occasionally wears CPAP at night    Past Surgical History:  Procedure Laterality Date  . ADRENALECTOMY    . BENTALL PROCEDURE  12/28/2011   Procedure: BENTALL PROCEDURE;  Surgeon: Rexene Alberts, MD;  Location: [facility];  Service: Open Heart Surgery;  Laterality: N/A;  . CARDIAC CATHETERIZATION     2013  . CARDIAC VALVE REPLACEMENT  12/28/2011   Bentall with tissue valve  . CHOLECYSTECTOMY    . COLONOSCOPY  09/25/2011  . COLONOSCOPY W/ BIOPSIES AND POLYPECTOMY  11/04/2008   colon polyps, diverticulosis   . CORONARY ARTERY BYPASS GRAFT  12/28/2011   Procedure: CORONARY ARTERY BYPASS GRAFTING (CABG);  Surgeon: Rexene Alberts, MD;  Location: [facility];  Service: Open Heart Surgery;  Laterality: N/A;  cabg x 1  . LEFT AND RIGHT HEART CATHETERIZATION WITH CORONARY ANGIOGRAM N/A 11/22/2011   Procedure: LEFT AND RIGHT HEART CATHETERIZATION WITH CORONARY ANGIOGRAM;  Surgeon: Burnell Blanks, MD;  Location: [facility] CATH LAB;  Service: Cardiovascular;  Laterality: N/A;  . MULTIPLE TOOTH EXTRACTIONS    . TEE WITHOUT  CARDIOVERSION  11/22/2011   Procedure: TRANSESOPHAGEAL ECHOCARDIOGRAM (TEE);  Surgeon: Larey Dresser, MD;  Location: [facility];  Service: Cardiovascular;  Laterality: N/A;  . UPPER GASTROINTESTINAL ENDOSCOPY  11/04/2008   w/bx, H pylori gastritis  . UPPER GASTROINTESTINAL ENDOSCOPY  09/25/2011    Family History  Problem Relation Age of Onset  . Breast cancer Sister   . Diabetes Sister   . Colon cancer Brother 70       at least 61  . Diabetes Brother   . Cancer Brother        colon, prostate  . Prostate cancer Brother   . Diabetes Brother   . Diabetes Sister   . Cancer Sister        breast, colon, melanoma  . Heart disease Sister   . Hypertension Mother   . Arthritis Mother   . Kidney failure Sister   . Colon cancer Sister   . Breast cancer Sister   . Kidney disease Sister        s/p transplant  . Hypertension Sister   . Diabetes Sister   . Stroke Father   . Hypertension Father     Social History   Socioeconomic History  . Marital status: Married    Spouse name: [name]  . Number of children: 0  . Years of education: Not on file  . 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 Smoking status: Never Smoker  . Smokeless tobacco: Never Used  Substance and Sexual Activity  . Alcohol use: No  . Drug use: No  . Sexual activity: Not Currently    Comment: lives with wife, pastros 2 churches  Other Topics Concern  . Not on file  Social History Narrative   The patient is married, lives with his wife in  [place].  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  He  lives a very sedentary lifestyle.  He is a nonsmoker.  He denies alcohol consumption.    Social Determinants of Health   Financial Resource Strain:   . Difficulty of Paying Living Expenses: Not on file  Food Insecurity:   . Worried About Charity fundraiser in the Last Year: Not on file  .  Ran Out of Food in the Last Year: Not on file  Transportation Needs:   . Lack of Transportation (Medical): Not on file  . Lack of Transportation (Non-Medical): Not on file  Physical Activity:   . Days of Exercise per Week: Not on file  .  Minutes of Exercise per Session: Not on file  Stress:   . Feeling of Stress : Not on file  Social Connections:   . Frequency of Communication with Friends and Family: Not on file  . Frequency of Social Gatherings with Friends and Family: Not on file  . Attends Religious Services: Not on file  . Active Member of Clubs or Organizations: Not on file  . Attends Archivist Meetings: Not on file  . Marital Status: Not on file  Intimate Partner Violence:   . Fear of Current or Ex-Partner: Not on file  . Emotionally Abused: Not on file  . Physically Abused: Not on file  . Sexually Abused: Not on file    Outpatient Medications Prior to Visit  Medication Sig Dispense Refill  . amLODipine (NORVASC) 10 MG tablet TAKE 1 TABLET BY MOUTH EVERY DAY 90 tablet 0  . ammonium lactate (AMLACTIN) 12 % lotion Apply 1 application topically 2 (two) times daily. 400 g 4  . aspirin EC 81 MG tablet Take 81 mg by mouth daily.    Marland Kitchen augmented betamethasone dipropionate (DIPROLENE-AF) 0.05 % cream Apply topically 2 (two) times daily. 15 g 0  . azelastine (ASTELIN) 0.1 % nasal spray Place 2 sprays into both nostrils 2 (two) times daily. Use in each nostril as directed 30 mL 2  . carvedilol (COREG) 25 MG tablet TAKE 1 TABLET BY MOUTH TWICE A DAY WITH FOOD 180 tablet 0  . ciclopirox (LOPROX) 0.77 % cream Apply to both feet and between toes twice daily for 4 weeks. 30 g 1  . clobetasol ointment (TEMOVATE) 0.05 % apply to affected area twice a day UPON ITCHING AND IRRITATION(NOT TO FACE OR GROIN)  0  . diazepam (VALIUM) 5 MG tablet Take 0.5-2 tablets (2.5-10 mg total) by mouth daily as needed for anxiety. 10 tablet 1  . diclofenac sodium (VOLTAREN) 1 % GEL Apply 4 g topically  4 (four) times daily. To affected joint. 100 g 11  . ferrous sulfate 325 (65 FE) MG tablet TAKE 1 TABLET BY MOUTH 2 TIMES A DAY BEFORE A MEAL 180 tablet 0  . fluticasone (FLONASE) 50 MCG/ACT nasal spray Place 2 sprays into both nostrils daily. 16 g 1  . furosemide (LASIX) 40 MG tablet Take 1 tablet (40 mg total) by mouth 2 (two) times daily. 60 tablet 12  . hydrocortisone 2.5 % cream Apply 1 application topically 2 (two) times daily.   0  . KLOR-CON M20 20 MEQ tablet TAKE 3 TABLETS (60 MEQ TOTAL) BY MOUTH 3 (THREE) TIMES DAILY. 270 tablet 1  . levocetirizine (XYZAL) 5 MG tablet TAKE 1 TABLET EVERY EVENING (Patient not taking: Reported on 08/18/2019) 30 tablet 0  . losartan (COZAAR) 100 MG tablet Take 1 tablet (100 mg total) by mouth daily. 90 tablet 2  . Multiple Vitamin (MULITIVITAMIN WITH MINERALS) TABS Take 1 tablet by mouth daily.    Marland Kitchen omeprazole (PRILOSEC) 40 MG capsule TAKE 1 CAPSULE BY MOUTH EVERY DAY 90 capsule 3  . timolol (TIMOPTIC) 0.5 % ophthalmic solution INSTILL 1 DROP INTO BOTH EYES TWICE A DAY    . triamterene-hydrochlorothiazide (MAXZIDE-25) 37.5-25 MG tablet TAKE 1 TABLET BY MOUTH EVERY DAY 90 tablet 1   No facility-administered medications prior to visit.    Allergies  Allergen Reactions  . Latex Hives    Latex gloves     Review of Systems  Constitutional: Positive for malaise/fatigue. Negative for fever.  HENT: Negative for congestion.  Eyes: Negative for blurred vision.  Respiratory: Negative for shortness of breath.   Cardiovascular: Negative for chest pain, palpitations and leg swelling.  Gastrointestinal: Negative for abdominal pain, blood in stool and nausea.  Genitourinary: Negative for dysuria and frequency.  Musculoskeletal: Negative for falls.  Skin: Negative for rash.  Neurological: Negative for dizziness, loss of consciousness and headaches.  Endo/Heme/Allergies: Negative for environmental allergies.  Psychiatric/Behavioral: Negative for depression.  The patient is nervous/anxious.        Objective:    Physical Exam unable to obtain via phone  BP 135/72   Pulse 69   Temp 97.7 F (36.5 C)   Wt (!) 322 lb (146.1 kg)   SpO2 98%   BMI 43.67 kg/m  Wt Readings from Last 3 Encounters:  08/29/19 (!) 322 lb (146.1 kg)  04/17/19 (!) 330 lb (149.7 kg)  08/12/18 (!) 335 lb 3.2 oz (152 kg)    Diabetic Foot Exam - Simple   No data filed     Lab Results  Component Value Date   WBC 9.1 04/22/2019   HGB 14.2 04/22/2019   HCT 42.4 04/22/2019   PLT 280.0 04/22/2019   GLUCOSE 116 (H) 04/22/2019   CHOL 156 04/22/2019   TRIG 119.0 04/22/2019   HDL 35.30 (L) 04/22/2019   LDLCALC 96 04/22/2019   ALT 14 04/22/2019   AST 14 04/22/2019   NA 140 04/22/2019   K 3.6 04/22/2019   CL 99 04/22/2019   CREATININE 0.88 04/22/2019   BUN 11 04/22/2019   CO2 30 04/22/2019   TSH 2.36 04/22/2019   PSA 7.32 (H) 09/07/2010   INR 1.21 01/02/2012   HGBA1C 5.6 04/22/2019    Lab Results  Component Value Date   TSH 2.36 04/22/2019   Lab Results  Component Value Date   WBC 9.1 04/22/2019   HGB 14.2 04/22/2019   HCT 42.4 04/22/2019   MCV 89.2 04/22/2019   PLT 280.0 04/22/2019   Lab Results  Component Value Date   NA 140 04/22/2019   K 3.6 04/22/2019   CO2 30 04/22/2019   GLUCOSE 116 (H) 04/22/2019   BUN 11 04/22/2019   CREATININE 0.88 04/22/2019   BILITOT 1.1 04/22/2019   ALKPHOS 71 04/22/2019   AST 14 04/22/2019   ALT 14 04/22/2019   PROT 6.8 04/22/2019   ALBUMIN 4.0 04/22/2019   CALCIUM 9.2 04/22/2019   GFR 101.56 04/22/2019   Lab Results  Component Value Date   CHOL 156 04/22/2019   Lab Results  Component Value Date   HDL 35.30 (L) 04/22/2019   Lab Results  Component Value Date   LDLCALC 96 04/22/2019   Lab Results  Component Value Date   TRIG 119.0 04/22/2019   Lab Results  Component Value Date   CHOLHDL 4 04/22/2019   Lab Results  Component Value Date   HGBA1C 5.6 04/22/2019       Assessment & Plan:    Problem List Items Addressed This Visit    Essential hypertension    Well controlled, no changes to meds. Encouraged heart healthy diet such as the DASH diet and exercise as tolerated.       Congestive heart failure (HCC)    No recent exacerbation, no change in medications at this time.      Hyperglycemia    hgba1c acceptable, minimize simple carbs. Increase exercise as tolerated.       Dyslipidemia    Encouraged heart healthy diet, increase exercise, avoid trans fats, consider a krill  oil cap daily      Anxiety attack    He has been doing better and no recent panic attacks. 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 Can use lorazepam prn if things worsen         I am having Elna Breslow. Beahan maintain his multivitamin with minerals, aspirin EC, clobetasol ointment, hydrocortisone, fluticasone, azelastine, levocetirizine, augmented betamethasone dipropionate, diclofenac sodium, furosemide, timolol, ciclopirox, losartan, Klor-Con M20, omeprazole, amLODipine, ammonium lactate, diazepam, triamterene-hydrochlorothiazide, carvedilol, and ferrous sulfate.  No orders of the defined types were placed in this encounter.    I discussed the assessment and treatment plan with the patient. The patient was provided an opportunity to ask questions and all were answered. The patient agreed with the plan and demonstrated an understanding of the instructions.   The patient was advised to call back or seek an in-person evaluation if the symptoms worsen or if the condition fails to improve as anticipated.  I provided 25 minutes of non-face-to-face time during this encounter.   [name], MD

## 2019-08-29 NOTE — Assessment & Plan Note (Signed)
Well controlled, no changes to meds. Encouraged heart healthy diet such as the DASH diet and exercise as tolerated.  °

## 2019-08-29 NOTE — Assessment & Plan Note (Signed)
Encouraged heart healthy diet, increase exercise, avoid trans fats, consider a krill oil cap daily 

## 2019-09-04 ENCOUNTER — Other Ambulatory Visit: Payer: Self-pay | Admitting: Family Medicine

## 2019-10-06 ENCOUNTER — Ambulatory Visit (INDEPENDENT_AMBULATORY_CARE_PROVIDER_SITE_OTHER): Payer: Medicare Other | Admitting: Podiatry

## 2019-10-06 ENCOUNTER — Other Ambulatory Visit: Payer: Self-pay

## 2019-10-06 ENCOUNTER — Encounter: Payer: Self-pay | Admitting: Podiatry

## 2019-10-06 DIAGNOSIS — M79674 Pain in right toe(s): Secondary | ICD-10-CM | POA: Diagnosis not present

## 2019-10-06 DIAGNOSIS — L84 Corns and callosities: Secondary | ICD-10-CM

## 2019-10-06 DIAGNOSIS — M79675 Pain in left toe(s): Secondary | ICD-10-CM

## 2019-10-06 DIAGNOSIS — E119 Type 2 diabetes mellitus without complications: Secondary | ICD-10-CM | POA: Diagnosis not present

## 2019-10-06 DIAGNOSIS — B351 Tinea unguium: Secondary | ICD-10-CM | POA: Diagnosis not present

## 2019-10-06 NOTE — Patient Instructions (Signed)
Diabetes Mellitus and Foot Care Foot care is an important part of your health, especially when you have diabetes. Diabetes may cause you to have problems because of poor blood flow (circulation) to your feet and legs, which can cause your skin to:  Become thinner and drier.  Break more easily.  Heal more slowly.  Peel and crack. You may also have nerve damage (neuropathy) in your legs and feet, causing decreased feeling in them. This means that you may not notice minor injuries to your feet that could lead to more serious problems. Noticing and addressing any potential problems early is the best way to prevent future foot problems. How to care for your feet Foot hygiene  Wash your feet daily with warm water and mild soap. Do not use hot water. Then, pat your feet and the areas between your toes until they are completely dry. Do not soak your feet as this can dry your skin.  Trim your toenails straight across. Do not dig under them or around the cuticle. File the edges of your nails with an emery board or nail file.  Apply a moisturizing lotion or petroleum jelly to the skin on your feet and to dry, brittle toenails. Use lotion that does not contain alcohol and is unscented. Do not apply lotion between your toes. Shoes and socks  Wear clean socks or stockings every day. Make sure they are not too tight. Do not wear knee-high stockings since they may decrease blood flow to your legs.  Wear shoes that fit properly and have enough cushioning. Always look in your shoes before you put them on to be sure there are no objects inside.  To break in new shoes, wear them for just a few hours a day. This prevents injuries on your feet. Wounds, scrapes, corns, and calluses  Check your feet daily for blisters, cuts, bruises, sores, and redness. If you cannot see the bottom of your feet, use a mirror or ask someone for help.  Do not cut corns or calluses or try to remove them with medicine.  If you  find a minor scrape, cut, or break in the skin on your feet, keep it and the skin around it clean and dry. You may clean these areas with mild soap and water. Do not clean the area with peroxide, alcohol, or iodine.  If you have a wound, scrape, corn, or callus on your foot, look at it several times a day to make sure it is healing and not infected. Check for: ? Redness, swelling, or pain. ? Fluid or blood. ? Warmth. ? Pus or a bad smell. General instructions  Do not cross your legs. This may decrease blood flow to your feet.  Do not use heating pads or hot water bottles on your feet. They may burn your skin. If you have lost feeling in your feet or legs, you may not know this is happening until it is too late.  Protect your feet from hot and cold by wearing shoes, such as at the beach or on hot pavement.  Schedule a complete foot exam at least once a year (annually) or more often if you have foot problems. If you have foot problems, report any cuts, sores, or bruises to your health care provider immediately. Contact a health care provider if:  You have a medical condition that increases your risk of infection and you have any cuts, sores, or bruises on your feet.  You have an injury that is not   healing.  You have redness on your legs or feet.  You feel burning or tingling in your legs or feet.  You have pain or cramps in your legs and feet.  Your legs or feet are numb.  Your feet always feel cold.  You have pain around a toenail. Get help right away if:  You have a wound, scrape, corn, or callus on your foot and: ? You have pain, swelling, or redness that gets worse. ? You have fluid or blood coming from the wound, scrape, corn, or callus. ? Your wound, scrape, corn, or callus feels warm to the touch. ? You have pus or a bad smell coming from the wound, scrape, corn, or callus. ? You have a fever. ? You have a red line going up your leg. Summary  Check your feet every day  for cuts, sores, red spots, swelling, and blisters.  Moisturize feet and legs daily.  Wear shoes that fit properly and have enough cushioning.  If you have foot problems, report any cuts, sores, or bruises to your health care provider immediately.  Schedule a complete foot exam at least once a year (annually) or more often if you have foot problems. This information is not intended to replace advice given to you by your health care provider. Make sure you discuss any questions you have with your health care provider. Document Revised: 04/30/2019 Document Reviewed: 09/08/2016 Elsevier Patient Education  2020 [employer] Inc.  

## 2019-10-06 NOTE — Progress Notes (Signed)
Subjective: David Parsons presents today for follow up of preventative diabetic foot care and painful mycotic nails b/l that are difficult to trim. Pain interferes with ambulation. Aggravating factors include wearing enclosed shoe gear. Pain is relieved with periodic professional debridement.   Allergies  Allergen Reactions  . Latex Hives    Latex gloves      Objective: There were no vitals filed for this visit.  Vascular Examination:  Capillary refill time to digits immediate b/l, palpable DP pulses b/l, palpable PT pulses b/l, pedal hair absent b/l and skin temperature gradient within normal limits b/l  Dermatological Examination: Pedal skin with normal turgor, texture and tone bilaterally, no open wounds bilaterally, no interdigital macerations bilaterally, toenails 1-5 b/l elongated, dystrophic, thickened, crumbly with subungual debris and hyperkeratotic lesion(s) submet head 5 b/l.  No erythema, no edema, no drainage, no flocculence  Musculoskeletal: Normal muscle strength 5/5 to all lower extremity muscle groups bilaterally, no pain crepitus or joint limitation noted with ROM b/l and pes planus deformity noted  Neurological: Protective sensation intact 5/5 intact bilaterally with 10g monofilament b/l and vibratory sensation intact b/l  Assessment: 1. Pain due to onychomycosis of toenails of both feet   2. Callus   3. Controlled type 2 diabetes mellitus without complication, without long-term current use of insulin ([place])    Plan: -Continue diabetic foot care principles. Literature dispensed on today.  -Toenails 1-5 b/l were debrided in length and girth without iatrogenic bleeding. -calluses were debrided without complication or incident. Total number debrided =2 submet head 5 b/l -Patient to continue soft, supportive shoe gear daily. -Patient to report any pedal injuries to medical professional immediately. -Patient/POA to call should there be question/concern in the  interim.  Return in about 3 months (around 01/03/2020) for diabetic nail and callus trim.

## 2019-10-13 ENCOUNTER — Other Ambulatory Visit: Payer: Self-pay | Admitting: Family Medicine

## 2019-10-17 ENCOUNTER — Other Ambulatory Visit: Payer: Self-pay | Admitting: Family Medicine

## 2019-10-28 ENCOUNTER — Encounter: Payer: Self-pay | Admitting: Medical

## 2019-10-28 ENCOUNTER — Other Ambulatory Visit: Payer: Self-pay

## 2019-10-28 ENCOUNTER — Ambulatory Visit (INDEPENDENT_AMBULATORY_CARE_PROVIDER_SITE_OTHER): Payer: Medicare Other | Admitting: Medical

## 2019-10-28 VITALS — BP 130/80 | HR 94 | Temp 96.6°F | Resp 16 | Ht 73.0 in | Wt 322.0 lb

## 2019-10-28 DIAGNOSIS — R739 Hyperglycemia, unspecified: Secondary | ICD-10-CM | POA: Diagnosis not present

## 2019-10-28 DIAGNOSIS — B49 Unspecified mycosis: Secondary | ICD-10-CM

## 2019-10-28 LAB — COMPREHENSIVE METABOLIC PANEL
ALT: 14 U/L (ref 0–53)
AST: 16 U/L (ref 0–37)
Albumin: 3.9 g/dL (ref 3.5–5.2)
Alkaline Phosphatase: 72 U/L (ref 39–117)
BUN: 13 mg/dL (ref 6–23)
CO2: 32 mEq/L (ref 19–32)
Calcium: 9.5 mg/dL (ref 8.4–10.5)
Chloride: 100 mEq/L (ref 96–112)
Creatinine, Ser: 0.99 mg/dL (ref 0.40–1.50)
GFR: 88.54 mL/min (ref >60.00)
Glucose, Bld: 115 mg/dL — ABNORMAL HIGH (ref 70–99)
Potassium: 3.4 mEq/L — ABNORMAL LOW (ref 3.5–5.1)
Sodium: 139 mEq/L (ref 135–145)
Total Bilirubin: 1 mg/dL (ref 0.2–1.2)
Total Protein: 6.9 g/dL (ref 6.0–8.3)

## 2019-10-28 LAB — HEMOGLOBIN A1C: Hgb A1c MFr Bld: 5.5 % (ref 4.6–6.5)

## 2019-10-28 MED ORDER — NYSTATIN 100000 UNIT/GM EX CREA: 1.0000 "application " | TOPICAL_CREAM | Freq: Two times a day (BID) | CUTANEOUS | 1 refills | Status: DC

## 2019-10-28 NOTE — Patient Instructions (Signed)
Probable fungal infection will rx nystatin cream to apply twice daily.   Keep area as dry as possible.   Give me up date in 5-7 by my chart. Might consider brief course of oral antifungal or refer to dermatologist.

## 2019-10-28 NOTE — Progress Notes (Signed)
Subjective:    Patient ID: David Parsons, male    DOB: 1941-05-30, 79 y.o.   MRN: [id number]  HPI  Pt in states his groin region has rash and he states some on scrotum. Pt states area it itches a lot at 2 am. Notices more itching when feels hot. He will apply cold compress and he states itch stops.   Pt has this on and off for 7 years at least. Pt states no diabetes. Last a1c was 5.6  Last 2 years was not itching but just worse recently.   Pt states one dermatologist gave cream but it was something over the counter.     Review of Systems  Constitutional: Negative for chills, fatigue and fever.  Respiratory: Negative for cough, chest tightness, shortness of breath and wheezing.   Cardiovascular: Negative for chest pain and palpitations.  Gastrointestinal: Negative for abdominal pain, anal bleeding, constipation and diarrhea.  Musculoskeletal: Negative for back pain.  Neurological: Negative for dizziness, seizures, weakness, numbness and headaches.  Hematological: Negative for adenopathy. Does not bruise/bleed easily.  Psychiatric/Behavioral: Negative for behavioral problems.   Past Medical History:  Diagnosis Date  . Aortic insufficiency     02/16/09 Chest CT:  The aortic root has a diameter measuring 4.6 cm, image 64 of the coronal series.  The ascending    thoracic aorta has a diameter of 3.8 cm, image 65 of the coronal series.  At the level of the aortic arch    the thoracic aorta has a maximum diameter of 3.2 cm.  The descending thoracic aorta has a maximum           diameter of 3.2 cm.  There is no evidence for aortic disse        . Aortic root enlargement (HCC)    The aortic root has a diameter measuring 4.6 cm, image 64 of the coronal series.  The ascending  thoracic aorta has a diameter of 3.8 cm, image 65 of the coronal series.  At the level of the aortic arch   the thoracic aorta has a maximum diameter of 3.2 cm.  The descending thoracic aorta has a maximum   diameter of  3.2 cm.  There is no evidence for aortic dissection.       Marland Kitchen CAD (coronary artery disease)   . Cardiomyopathy     01/2009 - Cath - nonobs. dzs.  EF 35%.  . CHF (congestive heart failure) ([place])   . Diabetes mellitus   . Dyslipidemia 01/14/2013  . Gallstones    s/p cholecystectomy    . GERD (gastroesophageal reflux disease)   . Glaucoma 10/05/2016  . Heart murmur   . Helicobacter pylori gastritis    (Tx Pylera 4/10)  . Hypertension   . Hypokalemia   . Iron deficiency anemia   . Obesity, Class III, BMI 40-49.9 (morbid obesity) ([place])   . Onychomycosis 03/09/2015  . PONV (postoperative nausea and vomiting)   . 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 Sleep apnea    occasionally wears CPAP at night     Social History   Socioeconomic History  . Marital status: Married    Spouse name: [name]  . Number of children: 0  . Years of education: Not on  file  . 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 Smoking status: Never Smoker  . Smokeless tobacco: Never Used  Substance and Sexual Activity  . Alcohol use: No  . Drug use: No  . Sexual activity: Not Currently    Comment: lives with wife, pastros 2 churches  Other Topics Concern  . Not on file  Social History Narrative   The patient is married, lives with his wife in  [place].  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  He  lives a very sedentary lifestyle.  He is a nonsmoker.  He denies alcohol consumption.    Social Determinants of Health   Financial Resource Strain:   . Difficulty of Paying Living Expenses: Not on file  Food Insecurity:   . Worried About  Charity fundraiser in the Last Year: Not on file  . Ran Out of Food in the Last Year: Not on file  Transportation Needs:   . Lack of Transportation (Medical): Not on file  . Lack of Transportation (Non-Medical): Not on file  Physical Activity:   . Days of Exercise per Week: Not on file  . Minutes of Exercise per Session: Not on file  Stress:   . Feeling of Stress : Not on file  Social Connections:   . Frequency of Communication with Friends and Family: Not on file  . Frequency of Social Gatherings with Friends and Family: Not on file  . Attends Religious Services: Not on file  . Active Member of Clubs or Organizations: Not on file  . Attends Archivist Meetings: Not on file  . Marital Status: Not on file  Intimate Partner Violence:   . Fear of Current or Ex-Partner: Not on file  . Emotionally Abused: Not on file  . Physically Abused: Not on file  . Sexually Abused: Not on file    Past Surgical History:  Procedure Laterality Date  . ADRENALECTOMY    . BENTALL PROCEDURE  12/28/2011   Procedure: BENTALL PROCEDURE;  Surgeon: Rexene Alberts, MD;  Location: [facility];  Service: Open Heart Surgery;  Laterality: N/A;  . CARDIAC CATHETERIZATION     2013  . CARDIAC VALVE REPLACEMENT  12/28/2011   Bentall with tissue valve  . CHOLECYSTECTOMY    . COLONOSCOPY  09/25/2011  . COLONOSCOPY W/ BIOPSIES AND POLYPECTOMY  11/04/2008   colon polyps, diverticulosis   . CORONARY ARTERY BYPASS GRAFT  12/28/2011   Procedure: CORONARY ARTERY BYPASS GRAFTING (CABG);  Surgeon: Rexene Alberts, MD;  Location: [facility];  Service: Open Heart Surgery;  Laterality: N/A;  cabg x 1  . LEFT AND RIGHT HEART CATHETERIZATION WITH CORONARY ANGIOGRAM N/A 11/22/2011   Procedure: LEFT AND RIGHT HEART CATHETERIZATION WITH CORONARY ANGIOGRAM;  Surgeon: Burnell Blanks, MD;  Location: [facility] C[facility] LAB;  Service: Cardiovascular;  Laterality: N/A;  . MULTIPLE TOOTH EXTRACTIONS    . TEE WITHOUT CARDIOVERSION  11/22/2011    Procedure: TRANSESOPHAGEAL ECHOCARDIOGRAM (TEE);  Surgeon: Larey Dresser, MD;  Location: [facility];  Service: Cardiovascular;  Laterality: N/A;  . UPPER GASTROINTESTINAL ENDOSCOPY  11/04/2008   w/bx, H pylori gastritis  . UPPER GASTROINTESTINAL ENDOSCOPY  09/25/2011    Family History  Problem Relation Age of Onset  . Breast cancer Sister   . Diabetes Sister   . Colon cancer Brother 49  at least 74  . Diabetes Brother   . Cancer Brother        colon, prostate  . Prostate cancer Brother   . Diabetes Brother   . Diabetes Sister   . Cancer Sister        breast, colon, melanoma  . Heart disease Sister   . Hypertension Mother   . Arthritis Mother   . Kidney failure Sister   . Colon cancer Sister   . Breast cancer Sister   . Kidney disease Sister        s/p transplant  . Hypertension Sister   . Diabetes Sister   . Stroke Father   . Hypertension Father     Allergies  Allergen Reactions  . Latex Hives    Latex gloves     Current Outpatient Medications on File Prior to Visit  Medication Sig Dispense Refill  . amLODipine (NORVASC) 10 MG tablet TAKE 1 TABLET BY MOUTH EVERY DAY 90 tablet 0  . ammonium lactate (AMLACTIN) 12 % lotion Apply 1 application topically 2 (two) times daily. 400 g 4  . aspirin EC 81 MG tablet Take 81 mg by mouth daily.    Marland Kitchen augmented betamethasone dipropionate (DIPROLENE-AF) 0.05 % cream Apply topically 2 (two) times daily. 15 g 0  . azelastine (ASTELIN) 0.1 % nasal spray Place 2 sprays into both nostrils 2 (two) times daily. Use in each nostril as directed 30 mL 2  . carvedilol (COREG) 25 MG tablet TAKE 1 TABLET BY MOUTH TWICE A DAY WITH FOOD 180 tablet 0  . ciclopirox (LOPROX) 0.77 % cream Apply to both feet and between toes twice daily for 4 weeks. 30 g 1  . clobetasol ointment (TEMOVATE) 0.05 % apply to affected area twice a day UPON ITCHING AND IRRITATION(NOT TO FACE OR GROIN)  0  . diazepam (VALIUM) 5 MG tablet Take 0.5-2 tablets (2.5-10 mg  total) by mouth daily as needed for anxiety. 10 tablet 1  . diclofenac sodium (VOLTAREN) 1 % GEL Apply 4 g topically 4 (four) times daily. To affected joint. 100 g 11  . ferrous sulfate 325 (65 FE) MG tablet TAKE 1 TABLET BY MOUTH 2 TIMES A DAY BEFORE A MEAL 180 tablet 1  . fluticasone (FLONASE) 50 MCG/ACT nasal spray Place 2 sprays into both nostrils daily. 16 g 1  . furosemide (LASIX) 40 MG tablet Take 1 tablet (40 mg total) by mouth 2 (two) times daily. 60 tablet 12  . hydrocortisone 2.5 % cream Apply 1 application topically 2 (two) times daily.   0  . KLOR-CON M20 20 MEQ tablet TAKE 3 TABLETS (60 MEQ TOTAL) BY MOUTH 3 (THREE) TIMES DAILY. 270 tablet 1  . levocetirizine (XYZAL) 5 MG tablet TAKE 1 TABLET EVERY EVENING (Patient not taking: Reported on 08/18/2019) 30 tablet 0  . losartan (COZAAR) 100 MG tablet Take 1 tablet (100 mg total) by mouth daily. 90 tablet 2  . Multiple Vitamin (MULITIVITAMIN WITH MINERALS) TABS Take 1 tablet by mouth daily.    Marland Kitchen omeprazole (PRILOSEC) 40 MG capsule TAKE 1 CAPSULE BY MOUTH EVERY DAY 90 capsule 3  . timolol (TIMOPTIC) 0.5 % ophthalmic solution INSTILL 1 DROP INTO BOTH EYES TWICE A DAY    . triamterene-hydrochlorothiazide (MAXZIDE-25) 37.5-25 MG tablet TAKE 1 TABLET BY MOUTH EVERY DAY 90 tablet 1   No current facility-administered medications on file prior to visit.    BP 130/80 (BP Location: Left Arm, Patient Position: Sitting, Cuff Size: Large)  Pulse 94   Temp (!) 96.6 F (35.9 C) (Temporal)   Resp 16   Ht 6\' 1"  (1.854 m)   Wt (!) 322 lb (146.1 kg)   SpO2 98%   BMI 42.48 kg/m       Objective:   Physical Exam   General- No acute distress. Pleasant patient. Neck- Full range of motion, no jvd Lungs- Clear, even and unlabored. Heart- regular rate and rhythm. Neurologic- CNII- XII grossly intact.  Genital- external genitalia no rash on penis. Mild rash groin both sides adjacent to scrotum. The scrotum itself looks normal.          Assessment & Plan:  Probable fungal infection will rx nystatin cream to apply twice daily.   Keep area as dry as possible.   Give me up date in 5-7 by my chart. Might consider brief course of oral antifungal or refer to dermatologist.  Mackie Pai, PA-C

## 2019-10-30 ENCOUNTER — Other Ambulatory Visit: Payer: Self-pay | Admitting: Family Medicine

## 2019-11-05 ENCOUNTER — Other Ambulatory Visit: Payer: Self-pay | Admitting: Family Medicine

## 2019-11-28 ENCOUNTER — Other Ambulatory Visit: Payer: Self-pay | Admitting: Family Medicine

## 2019-12-12 ENCOUNTER — Other Ambulatory Visit: Payer: Self-pay | Admitting: Family Medicine

## 2019-12-17 ENCOUNTER — Other Ambulatory Visit: Payer: Self-pay

## 2019-12-17 MED ORDER — LOSARTAN POTASSIUM 100 MG PO TABS: 100.0000 mg | ORAL_TABLET | Freq: Every day | ORAL | 1 refills | Status: DC

## 2019-12-23 DIAGNOSIS — H401132 Primary open-angle glaucoma, bilateral, moderate stage: Secondary | ICD-10-CM | POA: Diagnosis not present

## 2020-01-05 ENCOUNTER — Ambulatory Visit: Payer: Medicare Other | Admitting: Podiatry

## 2020-01-12 ENCOUNTER — Other Ambulatory Visit: Payer: Self-pay | Admitting: Family Medicine

## 2020-02-04 ENCOUNTER — Other Ambulatory Visit: Payer: Self-pay | Admitting: Family Medicine

## 2020-02-04 DIAGNOSIS — F410 Panic disorder [episodic paroxysmal anxiety] without agoraphobia: Secondary | ICD-10-CM

## 2020-02-06 NOTE — Telephone Encounter (Signed)
Last written: 07/29/19 Last ov: 08/29/19 Next ov: none Contract: none UDS: none

## 2020-02-21 ENCOUNTER — Other Ambulatory Visit: Payer: Self-pay | Admitting: Family Medicine

## 2020-03-05 ENCOUNTER — Encounter: Payer: Self-pay | Admitting: Podiatry

## 2020-03-05 ENCOUNTER — Other Ambulatory Visit: Payer: Self-pay

## 2020-03-05 ENCOUNTER — Ambulatory Visit (INDEPENDENT_AMBULATORY_CARE_PROVIDER_SITE_OTHER): Payer: Medicare Other | Admitting: Podiatry

## 2020-03-05 VITALS — Temp 95.3°F

## 2020-03-05 DIAGNOSIS — M2042 Other hammer toe(s) (acquired), left foot: Secondary | ICD-10-CM

## 2020-03-05 DIAGNOSIS — B351 Tinea unguium: Secondary | ICD-10-CM | POA: Diagnosis not present

## 2020-03-05 DIAGNOSIS — M79675 Pain in left toe(s): Secondary | ICD-10-CM

## 2020-03-05 DIAGNOSIS — M2041 Other hammer toe(s) (acquired), right foot: Secondary | ICD-10-CM

## 2020-03-05 DIAGNOSIS — L84 Corns and callosities: Secondary | ICD-10-CM | POA: Diagnosis not present

## 2020-03-05 DIAGNOSIS — M2142 Flat foot [pes planus] (acquired), left foot: Secondary | ICD-10-CM

## 2020-03-05 DIAGNOSIS — M79674 Pain in right toe(s): Secondary | ICD-10-CM | POA: Diagnosis not present

## 2020-03-05 DIAGNOSIS — E119 Type 2 diabetes mellitus without complications: Secondary | ICD-10-CM | POA: Diagnosis not present

## 2020-03-05 DIAGNOSIS — M2141 Flat foot [pes planus] (acquired), right foot: Secondary | ICD-10-CM

## 2020-03-05 NOTE — Progress Notes (Signed)
Subjective: David Parsons presents today for diabetic foot evaluation and painful callus(es) b/l feet and painful thick toenails that are difficult to trim. Pain interferes with ambulation. Aggravating factors include wearing enclosed shoe gear. Pain is relieved with periodic professional debridement.  He relates no new pedal concerns on today's visit. He states he lost his sister about 2 weeks ago.   [name], MD is patient's PCP. Last visit was: 08/29/2019, but did see PA Mackie Pai on 10/28/2019.  Past Medical History:  Diagnosis Date  . Aortic insufficiency     02/16/09 Chest CT:  The aortic root has a diameter measuring 4.6 cm, image 64 of the coronal series.  The ascending    thoracic aorta has a diameter of 3.8 cm, image 65 of the coronal series.  At the level of the aortic arch    the thoracic aorta has a maximum diameter of 3.2 cm.  The descending thoracic aorta has a maximum           diameter of 3.2 cm.  There is no evidence for aortic disse        . Aortic root enlargement (HCC)    The aortic root has a diameter measuring 4.6 cm, image 64 of the coronal series.  The ascending  thoracic aorta has a diameter of 3.8 cm, image 65 of the coronal series.  At the level of the aortic arch   the thoracic aorta has a maximum diameter of 3.2 cm.  The descending thoracic aorta has a maximum   diameter of 3.2 cm.  There is no evidence for aortic dissection.       Marland Kitchen CAD (coronary artery disease)   . Cardiomyopathy     01/2009 - Cath - nonobs. dzs.  EF 35%.  . CHF (congestive heart failure) ([place])   . Diabetes mellitus   . Dyslipidemia 01/14/2013  . Gallstones    s/p cholecystectomy    . GERD (gastroesophageal reflux disease)   . Glaucoma 10/05/2016  . Heart murmur   . Helicobacter pylori gastritis    (Tx Pylera 4/10)  . Hypertension   . Hypokalemia   . Iron deficiency anemia   . Obesity, Class III, BMI 40-49.9 (morbid obesity) ([place])   . Onychomycosis 03/09/2015  . PONV  (postoperative nausea and vomiting)   . 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 Sleep apnea    occasionally wears CPAP at night     Patient Active Problem List   Diagnosis Date Noted  . Anxiety attack 08/04/2019  . Glaucoma 10/05/2016  . Onychomycosis 03/09/2015  . Hypokalemia 07/26/2014  . Medicare annual wellness visit, subsequent 04/20/2014  . Dyslipidemia 01/14/2013  . 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 Follow-up examination, following unspecified surgery 01/22/2012  . 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 Dystrophic nail 10/23/2011  . Iron deficiency anemia 06/29/2011  . Personal history of colonic polyps 06/29/2011  . Family history of malignant neoplasm of gastrointestinal tract 06/29/2011  . PSA, INCREASED 09/12/2010  . CAD, NATIVE VESSEL 01/18/2010  . Obesity, Class III, BMI 40-49.9 (morbid obesity) ([place]) 11/29/2009  . Essential hypertension 04/05/2009  . Hyperglycemia 04/05/2009  . 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  . AORTIC INSUFFICIENCY, MODERATE 02/17/2009  .  CARDIOMYOPATHY, DILATED 02/17/2009  . Thoracic aneurysm without mention of rupture 01/26/2009  . Congestive heart failure ([place]) 01/04/2009  . NONSPECIFIC ABNORMAL ELECTROCARDIOGRAM 01/04/2009    Current Outpatient Medications on File Prior to Visit  Medication Sig Dispense Refill  . amLODipine (NORVASC) 10 MG tablet TAKE 1 TABLET BY MOUTH EVERY DAY 90 tablet 1  . ammonium lactate (AMLACTIN) 12 % lotion Apply 1 application topically 2 (two) times daily. 400 g 4  . aspirin EC 81 MG tablet Take 81 mg by mouth daily.    Marland Kitchen augmented betamethasone dipropionate (DIPROLENE-AF) 0.05 % cream Apply topically 2 (two) times daily. 15 g 0   . azelastine (ASTELIN) 0.1 % nasal spray Place 2 sprays into both nostrils 2 (two) times daily. Use in each nostril as directed 30 mL 2  . carvedilol (COREG) 25 MG tablet Take 1 tablet (25 mg total) by mouth 2 (two) times daily with a meal. 180 tablet 0  . ciclopirox (LOPROX) 0.77 % cream Apply to both feet and between toes twice daily for 4 weeks. 30 g 1  . clobetasol ointment (TEMOVATE) 0.05 % apply to affected area twice a day UPON ITCHING AND IRRITATION(NOT TO FACE OR GROIN)  0  . diazepam (VALIUM) 5 MG tablet TAKE 1/2-2 TABLETS BY MOUTH DAILY AS NEEDED FOR ANXIETY. 10 tablet 0  . diclofenac sodium (VOLTAREN) 1 % GEL Apply 4 g topically 4 (four) times daily. To affected joint. 100 g 11  . ferrous sulfate 325 (65 FE) MG tablet TAKE 1 TABLET BY MOUTH 2 TIMES A DAY BEFORE A MEAL 180 tablet 1  . fluticasone (FLONASE) 50 MCG/ACT nasal spray Place 2 sprays into both nostrils daily. 16 g 1  . furosemide (LASIX) 40 MG tablet Take 1 tablet (40 mg total) by mouth 2 (two) times daily. 60 tablet 12  . hydrocortisone 2.5 % cream Apply 1 application topically 2 (two) times daily.   0  . KLOR-CON M20 20 MEQ tablet TAKE 3 TABLETS (60 MEQ TOTAL) BY MOUTH 3 (THREE) TIMES DAILY. 810 tablet 1  . levocetirizine (XYZAL) 5 MG tablet TAKE 1 TABLET EVERY EVENING 30 tablet 0  . losartan (COZAAR) 100 MG tablet Take 1 tablet (100 mg total) by mouth daily. 90 tablet 1  . Multiple Vitamin (MULITIVITAMIN WITH MINERALS) TABS Take 1 tablet by mouth daily.    Marland Kitchen nystatin cream (MYCOSTATIN) Apply 1 application topically 2 (two) times daily. 30 g 1  . omeprazole (PRILOSEC) 40 MG capsule TAKE 1 CAPSULE BY MOUTH EVERY DAY 90 capsule 3  . timolol (TIMOPTIC) 0.5 % ophthalmic solution INSTILL 1 DROP INTO BOTH EYES TWICE A DAY    . triamterene-hydrochlorothiazide (MAXZIDE-25) 37.5-25 MG tablet TAKE 1 TABLET BY MOUTH EVERY DAY 90 tablet 1   No current facility-administered medications on file prior to visit.     Allergies   Allergen Reactions  . Latex Hives    Latex gloves     Objective: David Parsons is a pleasant 79 y.o. African American male morbidly obese in NAD. AAO x 3.  Vitals:   03/05/20 0815  Temp: (!) 95.3 F (35.2 C)    Vascular Examination: Capillary refill time to digits immediate b/l. Palpable pedal pulses b/l LE. Pedal hair absent. Lower extremity skin temperature gradient within normal limits. No pain with calf compression b/l. Trace edema noted b/l lower extremities.  Dermatological Examination: Pedal skin with normal turgor, texture and tone bilaterally. No open wounds bilaterally. No interdigital macerations bilaterally. Toenails 1-5  b/l elongated, discolored, dystrophic, thickened, crumbly with subungual debris and tenderness to dorsal palpation. Hyperkeratotic lesion(s) submet head 5 left foot and submet head 5 right foot.  No erythema, no edema, no drainage, no flocculence.  Musculoskeletal: Normal muscle strength 5/5 to all lower extremity muscle groups bilaterally. No pain crepitus or joint limitation noted with ROM b/l. Hammertoes noted to the b/l lower extremities. Pes planus deformity noted b/l.   Neurological Examination: Protective sensation intact 5/5 intact bilaterally with 10g monofilament b/l. Vibratory sensation intact b/l. Proprioception intact bilaterally. Clonus negative b/l.  Last A1c: Hemoglobin A1C Latest Ref Rng & Units 10/28/2019 04/22/2019  HGBA1C 4.6 - 6.5 % 5.5 5.6  Some recent data might be hidden   Assessment: 1. Pain due to onychomycosis of toenails of both feet   2. Callus   3. Acquired hammertoes of both feet   4. Pes planus of both feet   5. Controlled type 2 diabetes mellitus without complication, without long-term current use of insulin ([place])    Plan: -Examined patient. -No new findings. No new orders. -Continue diabetic foot care principles. -Toenails 1-5 b/l were debrided in length and girth with sterile nail nippers and dremel without  iatrogenic bleeding.  -Callus(es) submet head 5 left foot and submet head 5 right foot pared utilizing sterile scalpel blade without complication or incident. Total number debrided =2. -Patient to report any pedal injuries to medical professional immediately. -Patient to continue soft, supportive shoe gear daily. Start procedure for diabetic shoes. Patient qualifies based on diagnoses. -Patient to continue soft, supportive shoe gear daily. -Patient/POA to call should there be question/concern in the interim.  Return in about 3 months (around 06/05/2020).  Marzetta Board, DPM

## 2020-04-02 ENCOUNTER — Other Ambulatory Visit: Payer: Medicare Other | Admitting: Orthotics

## 2020-04-07 ENCOUNTER — Other Ambulatory Visit: Payer: Medicare Other | Admitting: Orthotics

## 2020-04-12 ENCOUNTER — Ambulatory Visit: Payer: Medicare Other | Admitting: Orthotics

## 2020-04-12 ENCOUNTER — Other Ambulatory Visit: Payer: Self-pay

## 2020-04-12 ENCOUNTER — Other Ambulatory Visit: Payer: Medicare Other | Admitting: Orthotics

## 2020-04-12 DIAGNOSIS — M79675 Pain in left toe(s): Secondary | ICD-10-CM

## 2020-04-12 DIAGNOSIS — M2142 Flat foot [pes planus] (acquired), left foot: Secondary | ICD-10-CM

## 2020-04-12 NOTE — Progress Notes (Signed)

## 2020-05-01 ENCOUNTER — Encounter: Payer: Self-pay | Admitting: Family Medicine

## 2020-05-01 ENCOUNTER — Other Ambulatory Visit: Payer: Self-pay

## 2020-05-01 ENCOUNTER — Telehealth: Payer: Self-pay

## 2020-05-01 ENCOUNTER — Emergency Department (INDEPENDENT_AMBULATORY_CARE_PROVIDER_SITE_OTHER)
Admission: EM | Admit: 2020-05-01 | Discharge: 2020-05-01 | Disposition: A | Discharge status: Home / Self Care | Payer: Medicare Other | Source: Home / Self Care

## 2020-05-01 DIAGNOSIS — R509 Fever, unspecified: Secondary | ICD-10-CM | POA: Diagnosis not present

## 2020-05-01 DIAGNOSIS — R319 Hematuria, unspecified: Secondary | ICD-10-CM | POA: Diagnosis not present

## 2020-05-01 DIAGNOSIS — R8281 Pyuria: Secondary | ICD-10-CM

## 2020-05-01 DIAGNOSIS — Z9189 Other specified personal risk factors, not elsewhere classified: Secondary | ICD-10-CM | POA: Diagnosis not present

## 2020-05-01 LAB — POCT URINALYSIS DIP (MANUAL ENTRY)
Glucose, UA: 100 mg/dL — AB
Nitrite, UA: POSITIVE — AB
Protein Ur, POC: >=300 mg/dL — AB
Spec Grav, UA: 1.02 (ref 1.010–1.025)
Urobilinogen, UA: >=8 E.U./dL — AB
pH, UA: 5.5 (ref 5.0–8.0)

## 2020-05-01 MED ORDER — CIPROFLOXACIN HCL 500 MG PO TABS: 500.0000 mg | ORAL_TABLET | Freq: Two times a day (BID) | ORAL | 0 refills | Status: DC

## 2020-05-01 MED ORDER — TAMSULOSIN HCL 0.4 MG PO CAPS: 0.4000 mg | ORAL_CAPSULE | Freq: Every day | ORAL | 0 refills | Status: DC

## 2020-05-01 NOTE — ED Provider Notes (Signed)
David Parsons CARE    CSN: [id number] Arrival date & time: 05/01/20  1056      History   Chief Complaint Chief Complaint  Patient presents with  . Fever  . Chills    HPI David Parsons is a 79 y.o. male.   Established [facility] patient here for evaluation of fever and chills.  Chills & fever x 24 hours TMax at home 101 last night  Tylenol at 0930 Pt has been vaccinated Chills have resolved  Denies any congestion or sore throat     Past Medical History:  Diagnosis Date  . Aortic insufficiency     02/16/09 Chest CT:  The aortic root has a diameter measuring 4.6 cm, image 64 of the coronal series.  The ascending    thoracic aorta has a diameter of 3.8 cm, image 65 of the coronal series.  At the level of the aortic arch    the thoracic aorta has a maximum diameter of 3.2 cm.  The descending thoracic aorta has a maximum           diameter of 3.2 cm.  There is no evidence for aortic disse        . Aortic root enlargement (HCC)    The aortic root has a diameter measuring 4.6 cm, image 64 of the coronal series.  The ascending  thoracic aorta has a diameter of 3.8 cm, image 65 of the coronal series.  At the level of the aortic arch   the thoracic aorta has a maximum diameter of 3.2 cm.  The descending thoracic aorta has a maximum   diameter of 3.2 cm.  There is no evidence for aortic dissection.       Marland Kitchen CAD (coronary artery disease)   . Cardiomyopathy     01/2009 - Cath - nonobs. dzs.  EF 35%.  . CHF (congestive heart failure) ([place])   . Diabetes mellitus   . Dyslipidemia 01/14/2013  . Gallstones    s/p cholecystectomy    . GERD (gastroesophageal reflux disease)   . Glaucoma 10/05/2016  . Heart murmur   . Helicobacter pylori gastritis    ([facility] 4/10)  . Hypertension   . Hypokalemia   . Iron deficiency anemia   . Obesity, Class III, BMI 40-49.9 (morbid obesity) (David Parsons)   . Onychomycosis 03/09/2015  . PONV (postoperative nausea and vomiting)   . 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 Sleep apnea    occasionally wears CPAP at night    Patient Active Problem List   Diagnosis Date Noted  . Anxiety attack 08/04/2019  . Glaucoma 10/05/2016  . Onychomycosis 03/09/2015  . Hypokalemia 07/26/2014  . Medicare annual wellness visit, subsequent 04/20/2014  . Dyslipidemia 01/14/2013  . 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 Follow-up examination, following unspecified surgery 01/22/2012  . 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 Dystrophic nail 10/23/2011  . Iron deficiency anemia 06/29/2011  . Personal history of colonic polyps 06/29/2011  . Family history of malignant neoplasm of gastrointestinal tract 06/29/2011  . PSA, INCREASED 09/12/2010  . CAD, NATIVE VESSEL 01/18/2010  . Obesity, Class III, BMI 40-49.9 (morbid obesity) (David Parsons) 11/29/2009  . Essential hypertension 04/05/2009  . Hyperglycemia 04/05/2009  . Hypertensive heart  disease without heart failure 03/11/2009  . AORTIC INSUFFICIENCY, MODERATE 02/17/2009  . CARDIOMYOPATHY, DILATED 02/17/2009  . Thoracic aneurysm without mention of rupture 01/26/2009  . Congestive heart failure (David Parsons) 01/04/2009  . NONSPECIFIC ABNORMAL ELECTROCARDIOGRAM 01/04/2009    Past Surgical History:  Procedure Laterality Date  . ADRENALECTOMY    . BENTALL PROCEDURE  12/28/2011   Procedure: BENTALL PROCEDURE;  Surgeon: Rexene Alberts, MD;  Location: [place];  Service: Open Heart Surgery;  Laterality: N/A;  . CARDIAC CATHETERIZATION     2013  . CARDIAC VALVE REPLACEMENT  12/28/2011   Bentall with tissue valve  . CHOLECYSTECTOMY    . COLONOSCOPY  09/25/2011  . COLONOSCOPY W/ BIOPSIES AND POLYPECTOMY  11/04/2008   colon polyps, diverticulosis   . CORONARY ARTERY BYPASS GRAFT   12/28/2011   Procedure: CORONARY ARTERY BYPASS GRAFTING (CABG);  Surgeon: Rexene Alberts, MD;  Location: [place];  Service: Open Heart Surgery;  Laterality: N/A;  cabg x 1  . LEFT AND RIGHT HEART CATHETERIZATION WITH CORONARY ANGIOGRAM N/A 11/22/2011   Procedure: LEFT AND RIGHT HEART CATHETERIZATION WITH CORONARY ANGIOGRAM;  Surgeon: Burnell Blanks, MD;  Location: [facility] CATH LAB;  Service: Cardiovascular;  Laterality: N/A;  . MULTIPLE TOOTH EXTRACTIONS    . TEE WITHOUT CARDIOVERSION  11/22/2011   Procedure: TRANSESOPHAGEAL ECHOCARDIOGRAM (TEE);  Surgeon: Larey Dresser, MD;  Location: [place];  Service: Cardiovascular;  Laterality: N/A;  . UPPER GASTROINTESTINAL ENDOSCOPY  11/04/2008   w/bx, H pylori gastritis  . UPPER GASTROINTESTINAL ENDOSCOPY  09/25/2011       Home Medications    Prior to Admission medications   Medication Sig Start Date End Date Taking? Authorizing Provider  amLODipine (NORVASC) 10 MG tablet TAKE 1 TABLET BY MOUTH EVERY DAY 02/06/20  Yes [name], MD  aspirin EC 81 MG tablet Take 81 mg by mouth daily.   Yes [provider]  azelastine (ASTELIN) 0.1 % nasal spray Place 2 sprays into both nostrils 2 (two) times daily. Use in each nostril as directed 12/20/16  Yes Saguier, Percell Miller, PA-C  carvedilol (COREG) 25 MG tablet Take 1 tablet (25 mg total) by mouth 2 (two) times daily with a meal. 02/24/20  Yes [name], MD  KLOR-CON M20 20 MEQ tablet TAKE 3 TABLETS (60 MEQ TOTAL) BY MOUTH 3 (THREE) TIMES DAILY. 12/12/19  Yes [name], MD  levocetirizine (XYZAL) 5 MG tablet TAKE 1 TABLET EVERY EVENING 03/26/17  Yes [name], MD  losartan (COZAAR) 100 MG tablet Take 1 tablet (100 mg total) by mouth daily. 12/17/19  Yes Burnell Blanks, MD  Multiple Vitamin (MULITIVITAMIN WITH MINERALS) TABS Take 1 tablet by mouth daily.   Yes [provider]  omeprazole (PRILOSEC) 40 MG capsule TAKE 1 CAPSULE BY MOUTH EVERY DAY 02/06/20  Yes [name], MD  ammonium lactate (AMLACTIN) 12 % lotion Apply 1 application topically 2 (two) times daily. 07/04/19   Marzetta Board, DPM  augmented betamethasone dipropionate (DIPROLENE-AF) 0.05 % cream Apply topically 2 (two) times daily. 08/31/17   Colon Branch, MD  ciclopirox (LOPROX) 0.77 % cream Apply to both feet and between toes twice daily for 4 weeks. 09/11/18   Marzetta Board, DPM  ciprofloxacin (CIPRO) 500 MG tablet Take 1 tablet (500 mg total) by mouth 2 (two) times daily. 05/01/20   Robyn Haber, MD  clobetasol ointment (TEMOVATE) 0.05 % apply to affected area twice a day UPON ITCHING AND IRRITATION(NOT TO  FACE OR GROIN) 01/21/15   [provider]  diazepam (VALIUM) 5 MG tablet TAKE 1/2-2 TABLETS BY MOUTH DAILY AS NEEDED FOR ANXIETY. 02/09/20   Copland, Gay Filler, MD  diclofenac sodium (VOLTAREN) 1 % GEL Apply 4 g topically 4 (four) times daily. To affected joint. 02/04/18   Gregor Hams, MD  ferrous sulfate 325 (65 FE) MG tablet TAKE 1 TABLET BY MOUTH 2 TIMES A DAY BEFORE A MEAL 02/06/20   [name], MD  fluticasone (FLONASE) 50 MCG/ACT nasal spray Place 2 sprays into both nostrils daily. 12/01/15   Saguier, Percell Miller, PA-C  furosemide (LASIX) 40 MG tablet Take 1 tablet (40 mg total) by mouth 2 (two) times daily. 02/18/18   Burnell Blanks, MD  hydrocortisone 2.5 % cream Apply 1 application topically 2 (two) times daily.  10/15/15   [provider]  nystatin cream (MYCOSTATIN) Apply 1 application topically 2 (two) times daily. 10/28/19   Saguier, Percell Miller, PA-C  timolol (TIMOPTIC) 0.5 % ophthalmic solution INSTILL 1 DROP INTO BOTH EYES TWICE A DAY 08/07/18   [provider]  triamterene-hydrochlorothiazide (MAXZIDE-25) 37.5-25 MG tablet TAKE 1 TABLET BY MOUTH EVERY DAY 01/12/20   [name], MD    Family History Family History  Problem Relation Age of Onset  . Breast cancer Sister   . Diabetes Sister   . Colon cancer Brother 70       at least 64    . Diabetes Brother   . Cancer Brother        colon, prostate  . Prostate cancer Brother   . Diabetes Brother   . Diabetes Sister   . Cancer Sister        breast, colon, melanoma  . Heart disease Sister   . Hypertension Mother   . Arthritis Mother   . Kidney failure Sister   . Colon cancer Sister   . Breast cancer Sister   . Kidney disease Sister        s/p transplant  . Hypertension Sister   . Diabetes Sister   . Stroke Father   . Hypertension Father     Social History Social History   Tobacco Use  . Smoking status: Never Smoker  . Smokeless tobacco: Never Used  Vaping Use  . Vaping Use: Never used  Substance Use Topics  . Alcohol use: No  . Drug use: No     Allergies   Latex   Review of Systems Review of Systems   Physical Exam Triage Vital Signs ED Triage Vitals  Enc Vitals Group     BP      Pulse      Resp      Temp      Temp src      SpO2      Weight      Height      Head Circumference      Peak Flow      Pain Score      Pain Loc      Pain Edu?      Excl. in [place]?    No data found.  Updated Vital Signs BP (!) 143/78 (BP Location: Right Arm)   Pulse 88   Temp 99 F (37.2 C)   Resp 18   SpO2 96%    Physical Exam   UC Treatments / Results  Labs (all labs ordered are listed, but only abnormal results are displayed) Labs Reviewed  POCT URINALYSIS DIP (MANUAL  ENTRY) - Abnormal; Notable for the following components:      Result Value   Color, UA brown (*)    Clarity, UA turbid (*)    Glucose, UA =100 (*)    Bilirubin, UA moderate (*)    Ketones, POC UA small (15) (*)    Blood, UA large (*)    Protein Ur, POC >=300 (*)    Urobilinogen, UA >=8.0 (*)    Nitrite, UA Positive (*)    Leukocytes, UA Small (1+) (*)    All other components within normal limits  SARS-COV-2 RNA,(COVID-19) QUALITATIVE NAAT  URINE CULTURE    EKG   Radiology No results found.  Procedures Procedures (including critical care time)  Medications  Ordered in UC Medications - No data to display  Initial Impression / Assessment and Plan / UC Course  I have reviewed the triage vital signs and the nursing notes.  Pertinent labs & imaging results that were available during my care of the patient were reviewed by me and considered in my medical decision making (see chart for details).    Final Clinical Impressions(s) / UC Diagnoses   Final diagnoses:  Fever, unspecified  At increased risk of exposure to COVID-19 virus  Pyuria  Hematuria, unspecified type   Discharge Instructions   None    ED Prescriptions    Medication Sig Dispense Auth. Provider   ciprofloxacin (CIPRO) 500 MG tablet Take 1 tablet (500 mg total) by mouth 2 (two) times daily. 20 tablet Robyn Haber, MD     PDMP not reviewed this encounter.   Robyn Haber, MD 05/01/20 1213

## 2020-05-01 NOTE — Discharge Instructions (Addendum)
Follow up with your doctor next week to make sure the infection is clearing up

## 2020-05-01 NOTE — Telephone Encounter (Signed)
Flomax called into preferred pharmacy per Dr. Federico Flake.

## 2020-05-01 NOTE — ED Triage Notes (Addendum)
Chills & fever x 24 hours TMax at home 101 last night  Tylenol at 0930 Pt has been vaccinated Chills have resolved  Denies any congestion or sore throat Pt on antibiotics in august for dental work

## 2020-05-02 LAB — SARS-COV-2 RNA,(COVID-19) QUALITATIVE NAAT: SARS CoV2 RNA: NOT DETECTED

## 2020-05-03 ENCOUNTER — Telehealth: Payer: Self-pay

## 2020-05-03 ENCOUNTER — Other Ambulatory Visit: Payer: Self-pay

## 2020-05-03 ENCOUNTER — Ambulatory Visit (INDEPENDENT_AMBULATORY_CARE_PROVIDER_SITE_OTHER): Payer: Medicare Other | Admitting: Medical

## 2020-05-03 VITALS — BP 143/85 | HR 99 | Temp 97.7°F | Resp 20 | Ht 73.0 in | Wt 312.4 lb

## 2020-05-03 DIAGNOSIS — R350 Frequency of micturition: Secondary | ICD-10-CM

## 2020-05-03 DIAGNOSIS — R82998 Other abnormal findings in urine: Secondary | ICD-10-CM | POA: Diagnosis not present

## 2020-05-03 DIAGNOSIS — R319 Hematuria, unspecified: Secondary | ICD-10-CM

## 2020-05-03 LAB — CBC WITH DIFFERENTIAL/PLATELET
Absolute Monocytes: 1667 cells/uL — ABNORMAL HIGH (ref 200–950)
Basophils Absolute: 116 cells/uL (ref 0–200)
Basophils Relative: 0.7 %
Eosinophils Absolute: 66 cells/uL (ref 15–500)
Eosinophils Relative: 0.4 %
HCT: 39.3 % (ref 38.5–50.0)
Hemoglobin: 13.1 g/dL — ABNORMAL LOW (ref 13.2–17.1)
Lymphs Abs: 1106 cells/uL (ref 850–3900)
MCH: 29.4 pg (ref 27.0–33.0)
MCHC: 33.3 g/dL (ref 32.0–36.0)
MCV: 88.3 fL (ref 80.0–100.0)
MPV: 12.2 fL (ref 7.5–12.5)
Monocytes Relative: 10.1 %
Neutro Abs: 13547 cells/uL — ABNORMAL HIGH (ref 1500–7800)
Neutrophils Relative %: 82.1 %
Platelets: 255 10*3/uL (ref 140–400)
RBC: 4.45 10*6/uL (ref 4.20–5.80)
RDW: 12.1 % (ref 11.0–15.0)
Total Lymphocyte: 6.7 %
WBC: 16.5 10*3/uL — ABNORMAL HIGH (ref 3.8–10.8)

## 2020-05-03 LAB — PSA: PSA: 46.25 ng/mL — ABNORMAL HIGH (ref <=4.0)

## 2020-05-03 NOTE — Telephone Encounter (Signed)
Nurse Assessment Nurse: Waymond Cera, RN, Benjamine Mola Date/Time (Eastern Time): 05/02/2020 3:04:03 PM Confirm and document reason for call. If symptomatic, describe symptoms. ---Wife states that husband is runny a fever. States UTI and antibiotics started on Saturday at [facility]. Has the patient had close contact with a person known or suspected to have the novel coronavirus illness OR traveled / lives in area with major community spread (including international travel) in the last 14 days from the onset of symptoms? * If Asymptomatic, screen for exposure and travel within the last 14 days. ---No Does the patient have any new or worsening symptoms? ---Yes Will a triage be completed? ---Yes Related visit to physician within the last 2 weeks? ---Yes Does the PT have any chronic conditions? (i.e. diabetes, asthma, this includes High risk factors for pregnancy, etc.) ---Yes List chronic conditions. ---aortic valve replacement "heart trouble" Is this a behavioral health or substance abuse call? ---No Guidelines Guideline Title Affirmed Question Affirmed Notes Nurse Date/Time (Eastern Time) Urinary Tract Infection on Antibiotic Follow-up Call - Male [1] Taking antibiotic < 24 hours for UTI AND [2] fever persists Cantrell, RN, Benjamine Mola 05/02/2020 3:06:22 PM Disp. Time Eilene Ghazi Time) Disposition Final User PLEASE NOTE: All timestamps contained within this report are represented as [place] Standard Time. CONFIDENTIALTY NOTICE: This fax transmission is intended only for the addressee. It contains information that is legally privileged, confidential or otherwise protected from use or disclosure. If you are not the intended recipient, you are strictly prohibited from reviewing, disclosing, copying using or disseminating any of this information or taking any action in reliance on or regarding this information. If you have received this fax in error, please notify us immediately by telephone so that we can  arrange for its return to [place]. Phone: [phone], Toll-Free: [phone], Fax: [phone] Page: 2 of 2 Call Id: [id number] 05/02/2020 3:15:19 PM Home Care Yes Cantrell, RN, Lorin Glass Disagree/Comply Comply Caller Understands Yes PreDisposition Go to ED Care Advice Given Per Guideline HOME CARE: * You should be able to treat this at home. REASSURANCE AND EDUCATION: * Most bacterial infections begin responding to antibiotics over a 1-3 day period. * The fever should disappear within 24 hours of starting the antibiotic. FOR ALL FEVERS: * Drink cold fluids to prevent dehydration. FEVER MEDICINES: * ACETAMINOPHEN - EXTRA STRENGTH TYLENOL: Take 1,000 mg (two 500 mg pills) every 8 hours as needed. Each Extra Strength Tylenol pill has 500 mg of acetaminophen. The most you should take each day is 3,000 mg (6 pills a day). * For fevers above 101 F (38.3 C) take either acetaminophen or ibuprofen. DRINK EXTRA FLUIDS: * Drink extra fluids. CALL BACK IF: * Fever lasts over 24 hours on antibiotics * Pain does not improve by day 4 on antibiotics * Urine symptoms do not improve by day 4 on antibiotics * You become worse CARE ADVICE given per Urinary Tract Infection on Antibiotic Follow-Up Call, Male (Adult) guideline

## 2020-05-03 NOTE — Patient Instructions (Addendum)
You do appear to have uti by urine studies done at [facility] and prostatitis can occur together.  Can continue tylenol for fever. Sounds like you are improving.  You urine culture from ED is pending. Will get psa and cbc today.  Continue cipro as you appear to be improving per your report.  Will repeat urine for blood in 7 days. If psa is elevated may need to refer you to your urologist.  Follow up in 7 days or as needed

## 2020-05-03 NOTE — Telephone Encounter (Signed)
Looks like patient went to urgent care on 9/11 and started on antibiotic and now is running a fever.  Was advised to go to ED but has not went yet.  Call was taken yesterday.

## 2020-05-03 NOTE — Telephone Encounter (Signed)
Nurse Assessment Nurse: Luvenia Heller, RN, Adriana Date/Time (Eastern Time): 05/02/2020 10:59:00 AM Confirm and document reason for call. If symptomatic, describe symptoms. ---Caller reports spouse had a fever since Friday. Sat.UC visit revealed pus and blood in urine. ABT RX on Sat. C/O fever and chills persist. Has the patient had close contact with a person known or suspected to have the novel coronavirus illness OR traveled / lives in area with major community spread (including international travel) in the last 14 days from the onset of symptoms? * If Asymptomatic, screen for exposure and travel within the last 14 days. ---No Does the patient have any new or worsening symptoms? ---Yes Will a triage be completed? ---Yes Related visit to physician within the last 2 weeks? ---Yes Does the PT have any chronic conditions? (i.e. diabetes, asthma, this includes High risk factors for pregnancy, etc.) ---Yes List chronic conditions. ---hear valve replacement, htn Is this a behavioral health or substance abuse call? ---No PLEASE NOTE: All timestamps contained within this report are represented as [place] Standard Time. CONFIDENTIALTY NOTICE: This fax transmission is intended only for the addressee. It contains information that is legally privileged, confidential or otherwise protected from use or disclosure. If you are not the intended recipient, you are strictly prohibited from reviewing, disclosing, copying using or disseminating any of this information or taking any action in reliance on or regarding this information. If you have received this fax in error, please notify us immediately by telephone so that we can arrange for its return to [place]. Phone: [phone], Toll-Free: [phone], Fax: [phone] Page: 2 of 2 Call Id: [id number] Guidelines Guideline Title Affirmed Question Affirmed Notes Nurse Date/Time Eilene Ghazi Time) Urinary Tract Infection on Antibiotic Follow-up Call - Male [1]  Taking antibiotic < 24 hours for UTI AND [2] fever persists [place], RN, Adriana 05/02/2020 11:01:10 AM Disp. Time Eilene Ghazi Time) Disposition Final User 05/02/2020 11:04:59 AM Home Care Yes Cisneros, RN, Fabio Bering Caller Disagree/Comply Comply Caller Understands Yes PreDisposition Call Doctor Care Advice Given Per Guideline HOME CARE: * You should be able to treat this at home. * Most bacterial infections begin responding to antibiotics over a 1-3 day period. REASSURANCE AND EDUCATION: * The fever should disappear within 24 hours of starting the antibiotic. FOR ALL FEVERS: * Drink cold fluids to prevent dehydration. * Dress in 1 layer of lightweight clothing and sleep with 1 light blanket. FEVER MEDICINES: * For fevers above 101 F (38.3 C) take either acetaminophen or ibuprofen. * They are over-the-counter (OTC) drugs that help treat both fever and pain. You can buy them at the drugstore. DRINK EXTRA FLUIDS: * Drink extra fluids. * Drink 8 to 10 cups (1,800 to 2,400 ml) of liquids a day. * Reason: This will water-down your urine and make it less painful to pass. If there is an infection, this will help wash out the germs from your bladder. * Dosage 100% Cranberry Juice: 1 oz (30 ml) twice a day. CRANBERRY JUICE: * You become worse CALL BACK IF: * Fever lasts over 24 hours on antibiotics CARE ADVICE given per Urinary Tract Infection on Antibiotic Follow-Up Call, Male (Adult) guideline

## 2020-05-03 NOTE — Telephone Encounter (Signed)
He should get COVID tested and be seen. I can see him virtually in next 2 days but if his fever is going up and he is feeling poorly he needs to be evaluated emergently as he has numerous risk factors. What symptoms is he having?

## 2020-05-03 NOTE — Telephone Encounter (Signed)
LMOM to call back

## 2020-05-03 NOTE — Progress Notes (Signed)
   Subjective:    Patient ID: David Parsons, male    DOB: [birth year], 79 y.o.   MRN: [id number]  HPI  Pt in for recent fever and blood in his urine. He states Saturday he got acute onset of symptoms of only fever at first.  He went to UC and he had negative covid test.  Pt had ua done at uc.   HPI at [facility].  Chills & fever x 24 hours TMax at home 101 last night  Tylenol at 0930 Pt has been vaccinated Chills have resolved  Denies any congestion or sore throat  Pt ua at UC    Color, UA brown (*)    Clarity, UA turbid (*)    Glucose, UA =100 (*)    Bilirubin, UA moderate (*)    Ketones, POC UA small (15) (*)    Blood, UA large (*)    Protein Ur, POC >=300 (*)    Urobilinogen, UA >=8.0 (*)    Nitrite, UA Positive (*)    Leukocytes, UA Small (1+)     Pt urinated before I got to room.   Pt states urine looked dark the other day. Now looks clearer when he urinates.  At [facility] they did not do psa.   Pt was given cipro for 10 days.  Pt has been able to urinate well. Just urinated as arrived and could not urinate again before leaving.   Review of Systems  Constitutional: Positive for fever. Negative for chills and fatigue.       Now resolving.  Respiratory: Negative for chest tightness, shortness of breath and wheezing.   Cardiovascular: Negative for chest pain and palpitations.  Gastrointestinal: Negative for abdominal pain.  Genitourinary: Positive for frequency. Negative for difficulty urinating, flank pain, testicular pain and urgency.       Frequency resolving.   Musculoskeletal: Negative for back pain.  Skin: Negative for rash.  Neurological: Negative for dizziness and headaches.  Hematological: Negative for adenopathy. Does not bruise/bleed easily.  Psychiatric/Behavioral: Negative for behavioral problems and confusion.       Objective:   Physical Exam  General- No acute distress. Pleasant patient. Neck- Full range of motion, no jvd Lungs-  Clear, even and unlabored. Heart- regular rate and rhythm. Neurologic- CNII- XII grossly intact. Abdomen-soft, nt, nd, +bs. No rebound or guarding. No suprapubic pressure. Back- no cva pain.     Assessment & Plan:  You do appear to have uti and prostatitis can occur together.  Can continue tylenol for fever. Sounds like you are improving.  You urine culture from ED is pending. Will get psa and cbc today.  Continue cipro as you appear to be improving per your report.  Will repeat urine for blood in 7 days. If psa is elevated may need to refer you to your urologist.  Follow up in 7 days or as needed

## 2020-05-04 LAB — URINE CULTURE
MICRO NUMBER:: 10940268
SPECIMEN QUALITY:: ADEQUATE

## 2020-05-04 NOTE — Telephone Encounter (Signed)
TY

## 2020-05-04 NOTE — Telephone Encounter (Signed)
Patient seen David Parsons 9/13

## 2020-05-07 ENCOUNTER — Ambulatory Visit (INDEPENDENT_AMBULATORY_CARE_PROVIDER_SITE_OTHER): Payer: Medicare Other

## 2020-05-07 ENCOUNTER — Other Ambulatory Visit: Payer: Self-pay

## 2020-05-07 DIAGNOSIS — Z23 Encounter for immunization: Secondary | ICD-10-CM

## 2020-05-10 ENCOUNTER — Ambulatory Visit (INDEPENDENT_AMBULATORY_CARE_PROVIDER_SITE_OTHER): Payer: Medicare Other | Admitting: Medical

## 2020-05-10 ENCOUNTER — Other Ambulatory Visit: Payer: Self-pay

## 2020-05-10 ENCOUNTER — Encounter: Payer: Self-pay | Admitting: Medical

## 2020-05-10 VITALS — BP 130/70 | HR 72 | Resp 18 | Ht 73.0 in | Wt 317.0 lb

## 2020-05-10 DIAGNOSIS — R319 Hematuria, unspecified: Secondary | ICD-10-CM

## 2020-05-10 DIAGNOSIS — N3001 Acute cystitis with hematuria: Secondary | ICD-10-CM

## 2020-05-10 DIAGNOSIS — N410 Acute prostatitis: Secondary | ICD-10-CM | POA: Diagnosis not present

## 2020-05-10 DIAGNOSIS — D72829 Elevated white blood cell count, unspecified: Secondary | ICD-10-CM

## 2020-05-10 LAB — POC URINALSYSI DIPSTICK (AUTOMATED)
Bilirubin, UA: NEGATIVE
Blood, UA: NEGATIVE
Glucose, UA: NEGATIVE
Ketones, UA: NEGATIVE
Leukocytes, UA: NEGATIVE
Nitrite, UA: NEGATIVE
Protein, UA: POSITIVE — AB
Spec Grav, UA: 1.02 (ref 1.010–1.025)
Urobilinogen, UA: 0.2 E.U./dL
pH, UA: 6 (ref 5.0–8.0)

## 2020-05-10 NOTE — Patient Instructions (Addendum)
For history of uti, hematuria and prostatitis, will get repeat UA, psa and culture.  After review of labs might need to extend course of antibiotic and may need to refer you to urologist.   Bp on second check normal. Last week when you check 130/70 range. Continue to check. If any bp over 140/90 notify us.  Follow up date to be determined after lab review.

## 2020-05-10 NOTE — Progress Notes (Signed)
Subjective:    Patient ID: David Parsons, male    DOB: 16-Aug-1941, 79 y.o.   MRN: [id number]  HPI  Pt in for follow up.  Pt states feels good/back to old self. Pt states urine looks clear. No urinary symptoms. See ros.  Pt psa was elevated. Pt was given cipro. Pt has one more tablet of cipro left.  Pt culture showed uti. UA showed large blood.  Now in for follow up.     Review of Systems  Constitutional: Negative for chills, fatigue and fever.  Respiratory: Negative for cough, chest tightness, shortness of breath and wheezing.   Cardiovascular: Negative for chest pain and palpitations.  Gastrointestinal: Negative for abdominal distention, abdominal pain, diarrhea, nausea and vomiting.  Genitourinary: Negative for difficulty urinating, flank pain, frequency, hematuria, penile swelling, scrotal swelling and urgency.  Musculoskeletal: Negative for back pain, joint swelling and neck pain.  Skin: Negative for rash.  Hematological: Negative for adenopathy. Does not bruise/bleed easily.    Past Medical History:  Diagnosis Date  . Aortic insufficiency     02/16/09 Chest CT:  The aortic root has a diameter measuring 4.6 cm, image 64 of the coronal series.  The ascending    thoracic aorta has a diameter of 3.8 cm, image 65 of the coronal series.  At the level of the aortic arch    the thoracic aorta has a maximum diameter of 3.2 cm.  The descending thoracic aorta has a maximum           diameter of 3.2 cm.  There is no evidence for aortic disse        . Aortic root enlargement (HCC)    The aortic root has a diameter measuring 4.6 cm, image 64 of the coronal series.  The ascending  thoracic aorta has a diameter of 3.8 cm, image 65 of the coronal series.  At the level of the aortic arch   the thoracic aorta has a maximum diameter of 3.2 cm.  The descending thoracic aorta has a maximum   diameter of 3.2 cm.  There is no evidence for aortic dissection.       Marland Kitchen CAD (coronary artery disease)     . Cardiomyopathy     01/2009 - Cath - nonobs. dzs.  EF 35%.  . CHF (congestive heart failure) ([place])   . Diabetes mellitus   . Dyslipidemia 01/14/2013  . Gallstones    s/p cholecystectomy    . GERD (gastroesophageal reflux disease)   . Glaucoma 10/05/2016  . Heart murmur   . Helicobacter pylori gastritis    (Tx Pylera 4/10)  . Hypertension   . Hypokalemia   . Iron deficiency anemia   . Obesity, Class III, BMI 40-49.9 (morbid obesity) ([place])   . Onychomycosis 03/09/2015  . PONV (postoperative nausea and vomiting)   . 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 Sleep apnea    occasionally wears CPAP at night     Social History   Socioeconomic History  . Marital status: Married    Spouse name: [name]  . Number of children: 0  . Years of education: Not on file  . 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 Smoking status: Never Smoker  . Smokeless tobacco: Never Used  Vaping Use  . Vaping Use: Never used  Substance and Sexual Activity  . Alcohol use: No  . Drug use: No  . Sexual activity: Not Currently    Comment: lives with wife, pastros 2 churches  Other Topics Concern  . Not on file  Social History Narrative   The patient is married, lives with his wife in  [place].  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  He  lives a very sedentary lifestyle.  He is a nonsmoker.  He denies alcohol consumption.    Social Determinants of Health   Financial Resource Strain:   . Difficulty of Paying Living Expenses: Not on file  Food Insecurity:   . Worried About Charity fundraiser in the Last Year: Not on file  . Ran  Out of Food in the Last Year: Not on file  Transportation Needs:   . Lack of Transportation (Medical): Not on file  . Lack of Transportation (Non-Medical): Not on file  Physical Activity:   . Days of Exercise per Week: Not on file  . Minutes of Exercise per Session: Not on file  Stress:   . Feeling of Stress : Not on file  Social Connections:   . Frequency of Communication with Friends and Family: Not on file  . Frequency of Social Gatherings with Friends and Family: Not on file  . Attends Religious Services: Not on file  . Active Member of Clubs or Organizations: Not on file  . Attends Archivist Meetings: Not on file  . Marital Status: Not on file  Intimate Partner Violence:   . Fear of Current or Ex-Partner: Not on file  . Emotionally Abused: Not on file  . Physically Abused: Not on file  . Sexually Abused: Not on file    Past Surgical History:  Procedure Laterality Date  . ADRENALECTOMY    . BENTALL PROCEDURE  12/28/2011   Procedure: BENTALL PROCEDURE;  Surgeon: Rexene Alberts, MD;  Location: [facility];  Service: Open Heart Surgery;  Laterality: N/A;  . CARDIAC CATHETERIZATION     2013  . CARDIAC VALVE REPLACEMENT  12/28/2011   Bentall with tissue valve  . CHOLECYSTECTOMY    . COLONOSCOPY  09/25/2011  . COLONOSCOPY W/ BIOPSIES AND POLYPECTOMY  11/04/2008   colon polyps, diverticulosis   . CORONARY ARTERY BYPASS GRAFT  12/28/2011   Procedure: CORONARY ARTERY BYPASS GRAFTING (CABG);  Surgeon: Rexene Alberts, MD;  Location: [facility];  Service: Open Heart Surgery;  Laterality: N/A;  cabg x 1  . LEFT AND RIGHT HEART CATHETERIZATION WITH CORONARY ANGIOGRAM N/A 11/22/2011   Procedure: LEFT AND RIGHT HEART CATHETERIZATION WITH CORONARY ANGIOGRAM;  Surgeon: Burnell Blanks, MD;  Location: [facility] CATH LAB;  Service: Cardiovascular;  Laterality: N/A;  . MULTIPLE TOOTH EXTRACTIONS    . TEE WITHOUT CARDIOVERSION  11/22/2011   Procedure: TRANSESOPHAGEAL ECHOCARDIOGRAM (TEE);  Surgeon:  Larey Dresser, MD;  Location: [facility];  Service: Cardiovascular;  Laterality: N/A;  . UPPER GASTROINTESTINAL ENDOSCOPY  11/04/2008   w/bx, H pylori gastritis  . UPPER GASTROINTESTINAL ENDOSCOPY  09/25/2011    Family History  Problem Relation Age of Onset  . Breast cancer Sister   . Diabetes Sister   . Colon cancer Brother 70       at least 55  .  Diabetes Brother   . Cancer Brother        colon, prostate  . Prostate cancer Brother   . Diabetes Brother   . Diabetes Sister   . Cancer Sister        breast, colon, melanoma  . Heart disease Sister   . Hypertension Mother   . Arthritis Mother   . Kidney failure Sister   . Colon cancer Sister   . Breast cancer Sister   . Kidney disease Sister        s/p transplant  . Hypertension Sister   . Diabetes Sister   . Stroke Father   . Hypertension Father     Allergies  Allergen Reactions  . Latex Hives    Latex gloves     Current Outpatient Medications on File Prior to Visit  Medication Sig Dispense Refill  . amLODipine (NORVASC) 10 MG tablet TAKE 1 TABLET BY MOUTH EVERY DAY 90 tablet 1  . ammonium lactate (AMLACTIN) 12 % lotion Apply 1 application topically 2 (two) times daily. (Patient not taking: Reported on 05/03/2020) 400 g 4  . aspirin EC 81 MG tablet Take 81 mg by mouth daily.    Marland Kitchen augmented betamethasone dipropionate (DIPROLENE-AF) 0.05 % cream Apply topically 2 (two) times daily. 15 g 0  . azelastine (ASTELIN) 0.1 % nasal spray Place 2 sprays into both nostrils 2 (two) times daily. Use in each nostril as directed 30 mL 2  . carvedilol (COREG) 25 MG tablet Take 1 tablet (25 mg total) by mouth 2 (two) times daily with a meal. 180 tablet 0  . ciclopirox (LOPROX) 0.77 % cream Apply to both feet and between toes twice daily for 4 weeks. 30 g 1  . ciprofloxacin (CIPRO) 500 MG tablet Take 1 tablet (500 mg total) by mouth 2 (two) times daily. 20 tablet 0  . clobetasol ointment (TEMOVATE) 0.05 % apply to affected area twice a  day UPON ITCHING AND IRRITATION(NOT TO FACE OR GROIN)  0  . diazepam (VALIUM) 5 MG tablet TAKE 1/2-2 TABLETS BY MOUTH DAILY AS NEEDED FOR ANXIETY. 10 tablet 0  . diclofenac sodium (VOLTAREN) 1 % GEL Apply 4 g topically 4 (four) times daily. To affected joint. 100 g 11  . ferrous sulfate 325 (65 FE) MG tablet TAKE 1 TABLET BY MOUTH 2 TIMES A DAY BEFORE A MEAL 180 tablet 1  . fluticasone (FLONASE) 50 MCG/ACT nasal spray Place 2 sprays into both nostrils daily. 16 g 1  . furosemide (LASIX) 40 MG tablet Take 1 tablet (40 mg total) by mouth 2 (two) times daily. 60 tablet 12  . hydrocortisone 2.5 % cream Apply 1 application topically 2 (two) times daily.   0  . KLOR-CON M20 20 MEQ tablet TAKE 3 TABLETS (60 MEQ TOTAL) BY MOUTH 3 (THREE) TIMES DAILY. 810 tablet 1  . levocetirizine (XYZAL) 5 MG tablet TAKE 1 TABLET EVERY EVENING 30 tablet 0  . losartan (COZAAR) 100 MG tablet Take 1 tablet (100 mg total) by mouth daily. 90 tablet 1  . Multiple Vitamin (MULITIVITAMIN WITH MINERALS) TABS Take 1 tablet by mouth daily.    Marland Kitchen nystatin cream (MYCOSTATIN) Apply 1 application topically 2 (two) times daily. 30 g 1  . omeprazole (PRILOSEC) 40 MG capsule TAKE 1 CAPSULE BY MOUTH EVERY DAY 90 capsule 3  . tamsulosin (FLOMAX) 0.4 MG CAPS capsule Take 1 capsule (0.4 mg total) by mouth daily. 30 capsule 0  . timolol (TIMOPTIC) 0.5 % ophthalmic solution INSTILL  1 DROP INTO BOTH EYES TWICE A DAY    . triamterene-hydrochlorothiazide (MAXZIDE-25) 37.5-25 MG tablet TAKE 1 TABLET BY MOUTH EVERY DAY 90 tablet 1   No current facility-administered medications on file prior to visit.    BP (!) 161/70   Pulse 72   Resp 18   Ht 6\' 1"  (1.854 m)   Wt (!) 317 lb (143.8 kg)   SpO2 96%   BMI 41.82 kg/m       Objective:   Physical Exam  General- No acute distress. Pleasant patient. Neck- Full range of motion, no jvd Lungs- Clear, even and unlabored. Heart- regular rate and rhythm. Neurologic- CNII- XII grossly  intact. Abdomen- soft, nt, nd, +bs, no rebound or guarding.  Back- no cva tenderness.      Assessment & Plan:  For history of uti, hematuria and prostatitis, will get repeat UA, psa and culture.  After review of labs might need to extend course of antibiotic and may need to refer you to urologist.   Follow up date to be determined after lab review.

## 2020-05-11 ENCOUNTER — Telehealth: Payer: Self-pay | Admitting: Medical

## 2020-05-11 DIAGNOSIS — R972 Elevated prostate specific antigen [PSA]: Secondary | ICD-10-CM

## 2020-05-11 MED ORDER — CIPROFLOXACIN HCL 500 MG PO TABS: 500.0000 mg | ORAL_TABLET | Freq: Two times a day (BID) | ORAL | 0 refills | Status: DC

## 2020-05-11 NOTE — Telephone Encounter (Signed)
Referral to urologist placed. 

## 2020-05-12 LAB — CBC WITH DIFFERENTIAL/PLATELET
Absolute Monocytes: 840 cells/uL (ref 200–950)
Basophils Absolute: 130 cells/uL (ref 0–200)
Basophils Relative: 1.3 %
Eosinophils Absolute: 370 cells/uL (ref 15–500)
Eosinophils Relative: 3.7 %
HCT: 40.3 % (ref 38.5–50.0)
Hemoglobin: 13.1 g/dL — ABNORMAL LOW (ref 13.2–17.1)
Lymphs Abs: 2050 cells/uL (ref 850–3900)
MCH: 29.3 pg (ref 27.0–33.0)
MCHC: 32.5 g/dL (ref 32.0–36.0)
MCV: 90.2 fL (ref 80.0–100.0)
MPV: 11.1 fL (ref 7.5–12.5)
Monocytes Relative: 8.4 %
Neutro Abs: 6610 cells/uL (ref 1500–7800)
Neutrophils Relative %: 66.1 %
Platelets: 454 10*3/uL — ABNORMAL HIGH (ref 140–400)
RBC: 4.47 10*6/uL (ref 4.20–5.80)
RDW: 12.5 % (ref 11.0–15.0)
Total Lymphocyte: 20.5 %
WBC: 10 10*3/uL (ref 3.8–10.8)

## 2020-05-12 LAB — PSA: PSA: 23.37 ng/mL — ABNORMAL HIGH (ref <=4.0)

## 2020-05-12 LAB — URINE CULTURE
MICRO NUMBER:: 10971343
SPECIMEN QUALITY:: ADEQUATE

## 2020-05-17 ENCOUNTER — Telehealth: Payer: Self-pay | Admitting: Podiatry

## 2020-05-17 NOTE — Telephone Encounter (Signed)
Pt left message checking on status of diabetic shoes and inserts.   Returned call after checking paperwork from [facility] and [name] stated pt needs appt to discuss as they do not have pt as diabetic. I talked with pts wife and she stated he is not diabetic and I explained that he would not qualify for the diabetic shoe program thru medicare and they will not cover the shoes. She will tell pt and have him discuss at his next appt with Dr Elisha Ponder.

## 2020-05-18 NOTE — Telephone Encounter (Signed)
I've reviewed his medical record as far back as 2013 and diabetes is listed on chart notes, but I will clarify with Dr. Charlett Blake. Thanks.

## 2020-06-03 ENCOUNTER — Telehealth: Payer: Self-pay | Admitting: Family Medicine

## 2020-06-03 NOTE — Progress Notes (Signed)
  Chronic Care Management   Outreach Note  06/03/2020 Name: David Parsons MRN: [id number] DOB: Dec 27, 1940  Referred by: [name], MD Reason for referral : No chief complaint on file.   An unsuccessful telephone outreach was attempted today. The patient was referred to the pharmacist for assistance with care management and care coordination.   Follow Up Plan:   Carley Perdue [employer] Scheduler

## 2020-06-04 ENCOUNTER — Telehealth: Payer: Self-pay | Admitting: Family Medicine

## 2020-06-04 NOTE — Progress Notes (Signed)
  Chronic Care Management   Outreach Note  06/04/2020 Name: David Parsons MRN: [id number] DOB: Dec 26, 1940  Referred by: [name], MD Reason for referral : No chief complaint on file.   A second unsuccessful telephone outreach was attempted today. The patient was referred to pharmacist for assistance with care management and care coordination.  Follow Up Plan:   Carley Perdue [employer] Scheduler

## 2020-06-07 NOTE — Telephone Encounter (Signed)
CallerHassan Rowan Call Back # [phone]   Pt's wife called regarding getting back in touch with someone who had been reaching out to them and is requesting a call back as soon as possible.  Please Advise   Thank you!

## 2020-06-08 ENCOUNTER — Telehealth: Payer: Self-pay | Admitting: Family Medicine

## 2020-06-08 NOTE — Progress Notes (Signed)
  Chronic Care Management   Outreach Note  06/08/2020 Name: David Parsons MRN: [id number] DOB: July 29, 1941  Referred by: [name], MD Reason for referral : No chief complaint on file.   A second unsuccessful telephone outreach was attempted today. The patient was referred to pharmacist for assistance with care management and care coordination.  Follow Up Plan:   Carley Perdue [employer] Scheduler

## 2020-06-08 NOTE — Progress Notes (Signed)
  Chronic Care Management   Note  06/08/2020 Name: David Parsons MRN: [id number] DOB: July 14, 1941  David Parsons is a 79 y.o. year old male who is a primary care patient of [name], MD. I reached out to [name] by phone today in response to a referral sent by [name]'s PCP, [name], MD.   [name] was given information about Chronic Care Management services today including:  1. CCM service includes personalized support from designated clinical staff supervised by his physician, including individualized plan of care and coordination with other care providers 2. 24/7 contact phone numbers for assistance for urgent and routine care needs. 3. Service will only be billed when office clinical staff spend 20 minutes or more in a month to coordinate care. 4. Only one practitioner may furnish and bill the service in a calendar month. 5. The patient may stop CCM services at any time (effective at the end of the month) by phone call to the office staff.   Patient agreed to services and verbal consent obtained.   Follow up plan:   Carley Perdue [employer] Scheduler

## 2020-06-17 ENCOUNTER — Emergency Department (INDEPENDENT_AMBULATORY_CARE_PROVIDER_SITE_OTHER)
Admission: EM | Admit: 2020-06-17 | Discharge: 2020-06-17 | Disposition: A | Discharge status: Home / Self Care | Payer: Medicare Other | Source: Home / Self Care

## 2020-06-17 ENCOUNTER — Emergency Department (INDEPENDENT_AMBULATORY_CARE_PROVIDER_SITE_OTHER): Payer: Medicare Other

## 2020-06-17 ENCOUNTER — Other Ambulatory Visit: Payer: Self-pay

## 2020-06-17 DIAGNOSIS — Z9889 Other specified postprocedural states: Secondary | ICD-10-CM | POA: Diagnosis not present

## 2020-06-17 DIAGNOSIS — R0989 Other specified symptoms and signs involving the circulatory and respiratory systems: Secondary | ICD-10-CM

## 2020-06-17 DIAGNOSIS — I6522 Occlusion and stenosis of left carotid artery: Secondary | ICD-10-CM | POA: Diagnosis not present

## 2020-06-17 MED ORDER — ALUM & MAG HYDROXIDE-SIMETH 200-200-20 MG/5ML PO SUSP
30.0000 mL | Freq: Once | ORAL | Status: AC
  Administered 2020-06-17: 30 mL via ORAL

## 2020-06-17 MED ORDER — LIDOCAINE VISCOUS HCL 2 % MT SOLN
15.0000 mL | Freq: Once | OROMUCOSAL | Status: AC
  Administered 2020-06-17: 15 mL via ORAL

## 2020-06-17 NOTE — ED Triage Notes (Signed)
Patient presents to Urgent Care with complaints of feeling like there is still a cheeto stuck in his throat since about 2 hours ago. Patient reports he has been able to eat and drink since the sensation began, but just cannot shake the feeling that something is still in his throat. Pt is maintaining airway and controlling secretions, in NAD at this time.

## 2020-06-17 NOTE — ED Provider Notes (Signed)
[facility]    CSN: [id number] Arrival date & time: 06/17/20  1723      History   Chief Complaint Chief Complaint  Patient presents with  . foreign body in throat    HPI David Parsons is a 79 y.o. male.   HPI  David Parsons is a 79 y.o. male presenting to UC with c/o feeling like a cheeto is still stuck in his throat since eating one about 2 hours ago.  He has been able to eat and drink other things since then but the sensation is bothersome. Denies cough, nausea or vomiting. No chest pain or abdominal pain.  Hx of GERD, which he does take medication for.     Past Medical History:  Diagnosis Date  . Aortic insufficiency     02/16/09 Chest CT:  The aortic root has a diameter measuring 4.6 cm, image 64 of the coronal series.  The ascending    thoracic aorta has a diameter of 3.8 cm, image 65 of the coronal series.  At the level of the aortic arch    the thoracic aorta has a maximum diameter of 3.2 cm.  The descending thoracic aorta has a maximum           diameter of 3.2 cm.  There is no evidence for aortic disse        . Aortic root enlargement (HCC)    The aortic root has a diameter measuring 4.6 cm, image 64 of the coronal series.  The ascending  thoracic aorta has a diameter of 3.8 cm, image 65 of the coronal series.  At the level of the aortic arch   the thoracic aorta has a maximum diameter of 3.2 cm.  The descending thoracic aorta has a maximum   diameter of 3.2 cm.  There is no evidence for aortic dissection.       Marland Kitchen CAD (coronary artery disease)   . Cardiomyopathy     01/2009 - Cath - nonobs. dzs.  EF 35%.  . CHF (congestive heart failure) ([place])   . Diabetes mellitus   . Dyslipidemia 01/14/2013  . Gallstones    s/p cholecystectomy    . GERD (gastroesophageal reflux disease)   . Glaucoma 10/05/2016  . Heart murmur   . Helicobacter pylori gastritis    ([place] 4/10)  . Hypertension   . Hypokalemia   . Iron deficiency anemia   . Obesity, Class III, BMI  40-49.9 (morbid obesity) ([name])   . Onychomycosis 03/09/2015  . PONV (postoperative nausea and vomiting)   . 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 Sleep apnea    occasionally wears CPAP at night    Patient Active Problem List   Diagnosis Date Noted  . Anxiety attack 08/04/2019  . Glaucoma 10/05/2016  . Onychomycosis 03/09/2015  . Hypokalemia 07/26/2014  . Medicare annual wellness visit, subsequent 04/20/2014  . Dyslipidemia 01/14/2013  . 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 Follow-up examination, following unspecified surgery 01/22/2012  . 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 Dystrophic nail 10/23/2011  . Iron deficiency anemia 06/29/2011  . Personal history of colonic polyps 06/29/2011  . Family history of malignant neoplasm of gastrointestinal tract 06/29/2011  . PSA, INCREASED 09/12/2010  .  CAD, NATIVE VESSEL 01/18/2010  . Obesity, Class III, BMI 40-49.9 (morbid obesity) ([name]) 11/29/2009  . Essential hypertension 04/05/2009  . Hyperglycemia 04/05/2009  . 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  . AORTIC INSUFFICIENCY, MODERATE 02/17/2009  . CARDIOMYOPATHY, DILATED 02/17/2009  . Thoracic aneurysm without mention of rupture 01/26/2009  . Congestive heart failure ([place]) 01/04/2009  . NONSPECIFIC ABNORMAL ELECTROCARDIOGRAM 01/04/2009    Past Surgical History:  Procedure Laterality Date  . ADRENALECTOMY    . BENTALL PROCEDURE  12/28/2011   Procedure: BENTALL PROCEDURE;  Surgeon: Rexene Alberts, MD;  Location: [facility];  Service: Open Heart Surgery;  Laterality: N/A;  . CARDIAC CATHETERIZATION     2013  . CARDIAC VALVE REPLACEMENT  12/28/2011   Bentall with tissue valve  . CHOLECYSTECTOMY    .  COLONOSCOPY  09/25/2011  . COLONOSCOPY W/ BIOPSIES AND POLYPECTOMY  11/04/2008   colon polyps, diverticulosis   . CORONARY ARTERY BYPASS GRAFT  12/28/2011   Procedure: CORONARY ARTERY BYPASS GRAFTING (CABG);  Surgeon: Rexene Alberts, MD;  Location: [facility];  Service: Open Heart Surgery;  Laterality: N/A;  cabg x 1  . LEFT AND RIGHT HEART CATHETERIZATION WITH CORONARY ANGIOGRAM N/A 11/22/2011   Procedure: LEFT AND RIGHT HEART CATHETERIZATION WITH CORONARY ANGIOGRAM;  Surgeon: Burnell Blanks, MD;  Location: [facility] CATH LAB;  Service: Cardiovascular;  Laterality: N/A;  . MULTIPLE TOOTH EXTRACTIONS    . TEE WITHOUT CARDIOVERSION  11/22/2011   Procedure: TRANSESOPHAGEAL ECHOCARDIOGRAM (TEE);  Surgeon: Larey Dresser, MD;  Location: [place];  Service: Cardiovascular;  Laterality: N/A;  . UPPER GASTROINTESTINAL ENDOSCOPY  11/04/2008   w/bx, H pylori gastritis  . UPPER GASTROINTESTINAL ENDOSCOPY  09/25/2011       Home Medications    Prior to Admission medications   Medication Sig Start Date End Date Taking? Authorizing Provider  amLODipine (NORVASC) 10 MG tablet TAKE 1 TABLET BY MOUTH EVERY DAY 02/06/20   [name], MD  ammonium lactate (AMLACTIN) 12 % lotion Apply 1 application topically 2 (two) times daily. Patient not taking: Reported on 05/03/2020 07/04/19   Marzetta Board, DPM  aspirin EC 81 MG tablet Take 81 mg by mouth daily.    [provider]  augmented betamethasone dipropionate (DIPROLENE-AF) 0.05 % cream Apply topically 2 (two) times daily. 08/31/17   Colon Branch, MD  azelastine (ASTELIN) 0.1 % nasal spray Place 2 sprays into both nostrils 2 (two) times daily. Use in each nostril as directed 12/20/16   Saguier, Percell Miller, PA-C  carvedilol (COREG) 25 MG tablet Take 1 tablet (25 mg total) by mouth 2 (two) times daily with a meal. 02/24/20   [name], MD  ciclopirox (LOPROX) 0.77 % cream Apply to both feet and between toes twice daily for 4 weeks. 09/11/18   Marzetta Board, DPM  ciprofloxacin (CIPRO) 500 MG tablet Take 1 tablet (500 mg total) by mouth 2 (two) times daily. 05/11/20   Saguier, Percell Miller, PA-C  clobetasol ointment (TEMOVATE) 0.05 % apply to affected area twice a day UPON ITCHING AND IRRITATION(NOT TO FACE OR GROIN) 01/21/15   [provider]  diazepam (VALIUM) 5 MG tablet TAKE 1/2-2 TABLETS BY MOUTH DAILY AS NEEDED FOR ANXIETY. 02/09/20   Copland, Gay Filler, MD  diclofenac sodium (VOLTAREN) 1 % GEL Apply 4 g topically 4 (four) times daily. To affected joint. 02/04/18   Gregor Hams, MD  ferrous sulfate 325 (65 FE) MG tablet TAKE 1 TABLET BY MOUTH  2 TIMES A DAY BEFORE A MEAL 02/06/20   [name], MD  fluticasone [facility]) 50 MCG/ACT nasal spray Place 2 sprays into both nostrils daily. 12/01/15   Saguier, Percell Miller, PA-C  furosemide (LASIX) 40 MG tablet Take 1 tablet (40 mg total) by mouth 2 (two) times daily. 02/18/18   Burnell Blanks, MD  hydrocortisone 2.5 % cream Apply 1 application topically 2 (two) times daily.  10/15/15   [provider]  KLOR-CON M20 20 MEQ tablet TAKE 3 TABLETS (60 MEQ TOTAL) BY MOUTH 3 (THREE) TIMES DAILY. 12/12/19   [name], MD  levocetirizine (XYZAL) 5 MG tablet TAKE 1 TABLET EVERY EVENING 03/26/17   [name], MD  losartan (COZAAR) 100 MG tablet Take 1 tablet (100 mg total) by mouth daily. 12/17/19   Burnell Blanks, MD  Multiple Vitamin (MULITIVITAMIN WITH MINERALS) TABS Take 1 tablet by mouth daily.    [provider]  nystatin cream (MYCOSTATIN) Apply 1 application topically 2 (two) times daily. 10/28/19   Saguier, Percell Miller, PA-C  omeprazole (PRILOSEC) 40 MG capsule TAKE 1 CAPSULE BY MOUTH EVERY DAY 02/06/20   [name], MD  tamsulosin (FLOMAX) 0.4 MG CAPS capsule Take 1 capsule (0.4 mg total) by mouth daily. 05/01/20   Robyn Haber, MD  timolol (TIMOPTIC) 0.5 % ophthalmic solution INSTILL 1 DROP INTO BOTH EYES TWICE A DAY 08/07/18   [provider]    triamterene-hydrochlorothiazide (MAXZIDE-25) 37.5-25 MG tablet TAKE 1 TABLET BY MOUTH EVERY DAY 01/12/20   [name], MD    Family History Family History  Problem Relation Age of Onset  . Breast cancer Sister   . Diabetes Sister   . Colon cancer Brother 70       at least 10  . Diabetes Brother   . Cancer Brother        colon, prostate  . Prostate cancer Brother   . Diabetes Brother   . Diabetes Sister   . Cancer Sister        breast, colon, melanoma  . Heart disease Sister   . Hypertension Mother   . Arthritis Mother   . Kidney failure Sister   . Colon cancer Sister   . Breast cancer Sister   . Kidney disease Sister        s/p transplant  . Hypertension Sister   . Diabetes Sister   . Stroke Father   . Hypertension Father     Social History Social History   Tobacco Use  . Smoking status: Never Smoker  . Smokeless tobacco: Never Used  Vaping Use  . Vaping Use: Never used  Substance Use Topics  . Alcohol use: No  . Drug use: No     Allergies   Latex   Review of Systems Review of Systems  HENT: Positive for sore throat (irritation). Negative for drooling, trouble swallowing and voice change.   Cardiovascular: Negative for chest pain and palpitations.  Gastrointestinal: Negative for abdominal pain, nausea and vomiting.     Physical Exam Triage Vital Signs ED Triage Vitals  Enc Vitals Group     BP 06/17/20 1735 (!) 161/92     Pulse Rate 06/17/20 1735 86     Resp 06/17/20 1735 16     Temp 06/17/20 1735 98.4 F (36.9 C)     Temp Source 06/17/20 1735 Oral     SpO2 06/17/20 1735 97 %     Weight --      Height --  Head Circumference --      Peak Flow --      Pain Score 06/17/20 1734 0     Pain Loc --      Pain Edu? --      Excl. in [place]? --    No data found.  Updated Vital Signs BP (!) 161/92 (BP Location: Right Arm)   Pulse 86   Temp 98.4 F (36.9 C) (Oral)   Resp 16   SpO2 97%   Visual Acuity Right Eye Distance:   Left Eye  Distance:   Bilateral Distance:    Right Eye Near:   Left Eye Near:    Bilateral Near:     Physical Exam Vitals and nursing note reviewed.  Constitutional:      General: He is not in acute distress.    Appearance: Normal appearance. He is well-developed. He is obese. He is not ill-appearing, toxic-appearing or diaphoretic.  HENT:     Head: Normocephalic and atraumatic.     Right Ear: Tympanic membrane and ear canal normal.     Left Ear: Tympanic membrane and ear canal normal.     Nose: Nose normal.     Mouth/Throat:     Mouth: Mucous membranes are moist.     Pharynx: Oropharynx is clear. No oropharyngeal exudate or posterior oropharyngeal erythema.  Cardiovascular:     Rate and Rhythm: Normal rate and regular rhythm.  Pulmonary:     Effort: Pulmonary effort is normal. No respiratory distress.     Breath sounds: Normal breath sounds. No stridor. No wheezing, rhonchi or rales.  Abdominal:     General: There is no distension.     Palpations: Abdomen is soft.     Tenderness: There is no abdominal tenderness.  Musculoskeletal:        General: Normal range of motion.     Cervical back: Normal range of motion and neck supple. No tenderness.  Lymphadenopathy:     Cervical: No cervical adenopathy.  Skin:    General: Skin is warm and dry.  Neurological:     Mental Status: He is alert and oriented to person, place, and time.  Psychiatric:        Behavior: Behavior normal.      UC Treatments / Results  Labs (all labs ordered are listed, but only abnormal results are displayed) Labs Reviewed - No data to display  EKG   Radiology DG Neck Soft Tissue  Result Date: 06/17/2020 CLINICAL DATA:  Foreign body sensation, feels like Cheeto it stuck in his throat since 2 hours ago EXAM: NECK SOFT TISSUES - 1+ VIEW COMPARISON:  Chest radiograph 01/22/2012, CT angiography 12/13/2011 FINDINGS: No radiopaque foreign body is identified. Airways are patent. No significant retropharyngeal  soft tissue swelling or gas is seen. Multilevel cervical spondylitic changes are present with several larger anterior osteophytes at C3-4 and C6-7. Postsurgical changes from prior sternotomy. Left cervical carotid atherosclerosis is noted incidentally. Remaining cervical soft tissues are unremarkable. Calcified aortic arch appears quite prominent some lamellated calcifications. No other acute abnormality in the upper chest or imaged lung apices. IMPRESSION: 1. No radiopaque foreign body is identified. 2. Multilevel cervical spondylitic changes. 3. Left cervical carotid atherosclerosis. 4. Prominent, calcified aortic arch compatible with dilatation seen on comparison CT angiography. Most recent angiographic evaluation available is from 2013. Correlate for outside imaging and if not recently obtained, consider further evaluation with CT angiography of the chest on a nonemergent basis. Electronically Signed   By: March Rummage  [facility] M.D.   On: 06/17/2020 18:40    Procedures Procedures (including critical care time)  Medications Ordered in UC Medications  alum & mag hydroxide-simeth (MAALOX/MYLANTA) 200-200-20 MG/5ML suspension 30 mL (30 mLs Oral Given 06/17/20 1757)    And  lidocaine (XYLOCAINE) 2 % viscous mouth solution 15 mL (15 mLs Oral Given 06/17/20 1757)    Initial Impression / Assessment and Plan / UC Course  I have reviewed the triage vital signs and the nursing notes.  Pertinent labs & imaging results that were available during my care of the patient were reviewed by me and considered in my medical decision making (see chart for details).     Discussed plain films with pt Reassured normal soft tissue neck exam Discussed cardiac findings and recommendations of nonemergent CT, pt states he has routine f/u with cardiologist coming up soon. Encouraged to keep that appointment.  Pt was given GI cocktail in UC, states sensation has nearly resolved. He was able to drink a glass of water prior to  discharge without difficulty.    Call 911 or have someone drive you to the hospital if symptoms significantly worsening. AVS given   Final Clinical Impressions(s) / UC Diagnoses   Final diagnoses:  Foreign body sensation in throat     Discharge Instructions      Please call your primary care provider tomorrow for recheck of symptoms if not improving, or you may call [facility] to schedule an appointment with a gastroenterologist for further evaluation of the sensation in your throat.   Call 911 or have someone drive you to the hospital if symptoms significantly worsening including worsening throat pain, trouble breathing, trouble swallowing, vomiting or gagging shortly after eating or drinking or other new concerning symptoms develop.      ED Prescriptions    None     PDMP not reviewed this encounter.   Noe Gens, [place] 06/17/20 1958

## 2020-06-17 NOTE — Discharge Instructions (Signed)
°  Please call your primary care provider tomorrow for recheck of symptoms if not improving, or you may call [facility] to schedule an appointment with a gastroenterologist for further evaluation of the sensation in your throat.   Call 911 or have someone drive you to the hospital if symptoms significantly worsening including worsening throat pain, trouble breathing, trouble swallowing, vomiting or gagging shortly after eating or drinking or other new concerning symptoms develop.

## 2020-06-18 ENCOUNTER — Ambulatory Visit (INDEPENDENT_AMBULATORY_CARE_PROVIDER_SITE_OTHER): Payer: Medicare Other | Admitting: Podiatry

## 2020-06-18 ENCOUNTER — Encounter: Payer: Self-pay | Admitting: Podiatry

## 2020-06-18 DIAGNOSIS — M2041 Other hammer toe(s) (acquired), right foot: Secondary | ICD-10-CM

## 2020-06-18 DIAGNOSIS — M2042 Other hammer toe(s) (acquired), left foot: Secondary | ICD-10-CM

## 2020-06-18 DIAGNOSIS — M79674 Pain in right toe(s): Secondary | ICD-10-CM | POA: Diagnosis not present

## 2020-06-18 DIAGNOSIS — B351 Tinea unguium: Secondary | ICD-10-CM

## 2020-06-18 DIAGNOSIS — M79675 Pain in left toe(s): Secondary | ICD-10-CM | POA: Diagnosis not present

## 2020-06-18 DIAGNOSIS — M2142 Flat foot [pes planus] (acquired), left foot: Secondary | ICD-10-CM

## 2020-06-18 DIAGNOSIS — M2141 Flat foot [pes planus] (acquired), right foot: Secondary | ICD-10-CM

## 2020-06-18 DIAGNOSIS — L84 Corns and callosities: Secondary | ICD-10-CM

## 2020-06-18 NOTE — Progress Notes (Signed)
Subjective:  Patient ID: David Parsons, male    DOB: 1941/03/18,  MRN: [id number]  David Parsons presents to clinic today for painful callus(es) b/l and painful thick toenails that are difficult to trim. Painful toenails interfere with ambulation. Aggravating factors include wearing enclosed shoe gear. Pain is relieved with periodic professional debridement. Painful calluses are aggravated when weightbearing with and without shoegear. Pain is relieved with periodic professional debridement..  79 y.o. male presents with the above complaint.    Review of Systems: Negative except as noted in the HPI. Past Medical History:  Diagnosis Date   Aortic insufficiency     02/16/09 Chest CT:  The aortic root has a diameter measuring 4.6 cm, image 64 of the coronal series.  The ascending    thoracic aorta has a diameter of 3.8 cm, image 65 of the coronal series.  At the level of the aortic arch    the thoracic aorta has a maximum diameter of 3.2 cm.  The descending thoracic aorta has a maximum           diameter of 3.2 cm.  There is no evidence for aortic disse         Aortic root enlargement (HCC)    The aortic root has a diameter measuring 4.6 cm, image 64 of the coronal series.  The ascending  thoracic aorta has a diameter of 3.8 cm, image 65 of the coronal series.  At the level of the aortic arch   the thoracic aorta has a maximum diameter of 3.2 cm.  The descending thoracic aorta has a maximum   diameter of 3.2 cm.  There is no evidence for aortic dissection.        CAD (coronary artery disease)    Cardiomyopathy     01/2009 - Cath - nonobs. dzs.  EF 35%.   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   Biological Bentall Aortic Root Replacement using 25 mm Edwards Magna Ease pericardial tissue valve and 28 mm Vascutek Gelweave Valsalva conduit with reimplantation of left main and right coronary artery and CABG x1 using SVG to RCA   Sleep apnea    occasionally wears CPAP at night   Past Surgical History:  Procedure Laterality Date   ADRENALECTOMY     BENTALL PROCEDURE  12/28/2011   Procedure: BENTALL PROCEDURE;  Surgeon: Rexene Alberts, MD;  Location: [place];  Service: Open Heart Surgery;  Laterality: N/A;   CARDIAC CATHETERIZATION     2013   CARDIAC VALVE REPLACEMENT  12/28/2011   Bentall with tissue valve   CHOLECYSTECTOMY     COLONOSCOPY  09/25/2011   COLONOSCOPY W/ BIOPSIES AND POLYPECTOMY  11/04/2008   colon polyps, diverticulosis    CORONARY ARTERY BYPASS GRAFT  12/28/2011   Procedure: CORONARY ARTERY BYPASS GRAFTING (CABG);  Surgeon: Rexene Alberts, MD;  Location: [place];  Service: Open Heart Surgery;  Laterality: N/A;  cabg x 1   LEFT  AND RIGHT HEART CATHETERIZATION WITH CORONARY ANGIOGRAM N/A 11/22/2011   Procedure: LEFT AND RIGHT HEART CATHETERIZATION WITH CORONARY ANGIOGRAM;  Surgeon: Burnell Blanks, MD;  Location: [facility] C[facility] LAB;  Service: Cardiovascular;  Laterality: N/A;   MULTIPLE TOOTH EXTRACTIONS     TEE WITHOUT CARDIOVERSION  11/22/2011   Procedure: TRANSESOPHAGEAL ECHOCARDIOGRAM (TEE);  Surgeon: Larey Dresser, MD;  Location: [place];  Service: Cardiovascular;  Laterality: N/A;   UPPER GASTROINTESTINAL ENDOSCOPY  11/04/2008   w/bx, H pylori gastritis   UPPER GASTROINTESTINAL ENDOSCOPY  09/25/2011    Current Outpatient Medications:    amLODipine (NORVASC) 10 MG tablet, TAKE 1 TABLET BY MOUTH EVERY DAY, Disp: 90 tablet, Rfl: 1   ammonium lactate (AMLACTIN) 12 % lotion, Apply 1  application topically 2 (two) times daily., Disp: 400 g, Rfl: 4   aspirin EC 81 MG tablet, Take 81 mg by mouth daily., Disp: , Rfl:    augmented betamethasone dipropionate (DIPROLENE-AF) 0.05 % cream, Apply topically 2 (two) times daily., Disp: 15 g, Rfl: 0   azelastine (ASTELIN) 0.1 % nasal spray, Place 2 sprays into both nostrils 2 (two) times daily. Use in each nostril as directed, Disp: 30 mL, Rfl: 2   carvedilol (COREG) 25 MG tablet, Take 1 tablet (25 mg total) by mouth 2 (two) times daily with a meal., Disp: 180 tablet, Rfl: 0   ciclopirox (LOPROX) 0.77 % cream, Apply to both feet and between toes twice daily for 4 weeks., Disp: 30 g, Rfl: 1   ciprofloxacin (CIPRO) 500 MG tablet, Take 1 tablet (500 mg total) by mouth 2 (two) times daily., Disp: 20 tablet, Rfl: 0   clobetasol ointment (TEMOVATE) 0.05 %, apply to affected area twice a day UPON ITCHING AND IRRITATION(NOT TO FACE OR GROIN), Disp: , Rfl: 0   diazepam (VALIUM) 5 MG tablet, TAKE 1/2-2 TABLETS BY MOUTH DAILY AS NEEDED FOR ANXIETY., Disp: 10 tablet, Rfl: 0   diclofenac sodium (VOLTAREN) 1 % GEL, Apply 4 g topically 4 (four) times daily. To affected joint., Disp: 100 g, Rfl: 11   ferrous sulfate 325 (65 FE) MG tablet, TAKE 1 TABLET BY MOUTH 2 TIMES A DAY BEFORE A MEAL, Disp: 180 tablet, Rfl: 1   fluticasone (FLONASE) 50 MCG/ACT nasal spray, Place 2 sprays into both nostrils daily., Disp: 16 g, Rfl: 1   furosemide (LASIX) 40 MG tablet, Take 1 tablet (40 mg total) by mouth 2 (two) times daily., Disp: 60 tablet, Rfl: 12   hydrocortisone 2.5 % cream, Apply 1 application topically 2 (two) times daily. , Disp: , Rfl: 0   KLOR-CON M20 20 MEQ tablet, TAKE 3 TABLETS (60 MEQ TOTAL) BY MOUTH 3 (THREE) TIMES DAILY., Disp: 810 tablet, Rfl: 1   levocetirizine (XYZAL) 5 MG tablet, TAKE 1 TABLET EVERY EVENING, Disp: 30 tablet, Rfl: 0   losartan (COZAAR) 100 MG tablet, Take 1 tablet (100 mg total) by mouth daily., Disp: 90 tablet, Rfl:  1   Multiple Vitamin (MULITIVITAMIN WITH MINERALS) TABS, Take 1 tablet by mouth daily., Disp: , Rfl:    nystatin cream (MYCOSTATIN), Apply 1 application topically 2 (two) times daily., Disp: 30 g, Rfl: 1   omeprazole (PRILOSEC) 40 MG capsule, TAKE 1 CAPSULE BY MOUTH EVERY DAY, Disp: 90 capsule, Rfl: 3   tamsulosin (FLOMAX) 0.4 MG CAPS capsule, Take 1 capsule (0.4 mg total) by mouth daily., Disp: 30 capsule, Rfl: 0   timolol (TIMOPTIC) 0.5 % ophthalmic solution, INSTILL 1 DROP INTO BOTH EYES TWICE A DAY,  Disp: , Rfl:    triamterene-hydrochlorothiazide (MAXZIDE-25) 37.5-25 MG tablet, TAKE 1 TABLET BY MOUTH EVERY DAY, Disp: 90 tablet, Rfl: 1 Allergies  Allergen Reactions   Latex Hives    Latex gloves    Social History   Occupational History    Employer: [employer]    Comment: Owner of 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 African American male, morbidly obese in NAD. AAO x 3.   Vascular Capillary refill time to digits immediate b/l. Palpable pedal pulses b/l LE. Pedal hair absent. Lower extremity skin temperature gradient within normal limits. No pain with calf compression b/l. Trace edema noted b/l lower extremities. No cyanosis or clubbing noted.  Neurologic Normal speech. Oriented to person, place, and time. Protective sensation intact 5/5 intact bilaterally with 10g monofilament b/l. Vibratory sensation intact b/l. Proprioception intact bilaterally.  Dermatologic Pedal skin with normal turgor, texture and tone bilaterally. No open wounds bilaterally. No interdigital macerations bilaterally. Toenails 1-5 b/l elongated, discolored, dystrophic, thickened,  crumbly with subungual debris and tenderness to dorsal palpation. Hyperkeratotic lesion(s) submet head 5 left foot and submet head 5 right foot.  No erythema, no edema, no drainage, no flocculence.  Orthopedic: Normal muscle strength 5/5 to all lower extremity muscle groups bilaterally. No pain crepitus or joint limitation noted with ROM b/l. Hammertoes noted to the b/l lower extremities. Pes planus deformity noted b/l.    Radiographs: None Assessment:   1. Pain due to onychomycosis of toenails of both feet   2. Callus   3. Pes planus of both feet   4. Acquired hammertoes of both feet    Plan:  Patient was evaluated and treated and all questions answered.  Onychomycosis with pain -Nails palliatively debridement as below -Educated on self-care  Procedure: Nail Debridement Rationale: Pain Type of Debridement: manual, sharp debridement. Instrumentation: Nail nipper, rotary burr. Number of Nails: 10 -Examined patient. -Patient declined callus paring today. Medicare ABN signed for this year. Copy given to patient on today's visit and copy placed in patient's chart. -Toenails 1-5 b/l were debrided in length and girth with sterile nail nippers and dremel without iatrogenic bleeding.  -Patient to report any pedal injuries to medical professional immediately. -Patient to continue soft, supportive shoe gear daily. -Patient/POA to call should there be question/concern in the interim.  Return in about 3 months (around 09/18/2020).  Marzetta Board, DPM

## 2020-06-24 ENCOUNTER — Other Ambulatory Visit: Payer: Self-pay | Admitting: Cardiovascular Disease

## 2020-06-25 DIAGNOSIS — R972 Elevated prostate specific antigen [PSA]: Secondary | ICD-10-CM | POA: Diagnosis not present

## 2020-06-25 DIAGNOSIS — N3000 Acute cystitis without hematuria: Secondary | ICD-10-CM | POA: Diagnosis not present

## 2020-07-07 ENCOUNTER — Other Ambulatory Visit: Payer: Self-pay | Admitting: Family Medicine

## 2020-08-03 ENCOUNTER — Other Ambulatory Visit: Payer: Self-pay | Admitting: Family Medicine

## 2020-08-30 ENCOUNTER — Ambulatory Visit: Payer: Medicare Other | Admitting: Pharmacist

## 2020-08-30 NOTE — Patient Instructions (Addendum)
Visit Information  Goals Addressed            This Visit's Progress   . Chronic Care Management Pharmacy Care Plan       CARE PLAN ENTRY (see longitudinal plan of care for additional care plan information)  Current Barriers:  . Chronic Disease Management support, education, and care coordination needs related to Hypertension, Hyperlipidemia/CAD, Heart Failure, Anxiety, GERD, BPH, Glaucoma, Anemia, Onychomycosis   Hypertension BP Readings from Last 3 Encounters:  06/17/20 (!) 161/92  05/10/20 130/70  05/03/20 (!) 143/85   . Pharmacist Clinical Goal(s): o Over the next 90 days, patient will work with PharmD and providers to achieve BP goal <130/80 . Current regimen:  . Carvedilol 25mg  twice daily  . Triamterene/hctz 37.5/25mg  daily . Amlodipine 10mg  daily . Losartan 100mg  daily . Interventions: o Discussed BP goal o Requested patient to record BP readings . Patient self care activities - Over the next 90 days, patient will: o Check BP 3 times week, document, and provide at future appointments o Ensure daily salt intake < 2300 mg/Marjarie Irion  Hyperlipidemia/Hx of CAD Lab Results  Component Value Date/Time   LDLCALC 96 04/22/2019 08:32 AM   . Pharmacist Clinical Goal(s): o Over the next 90 days, patient will work with PharmD and providers to achieve LDL goal < 70 . Current regimen:  o Aspirin 81mg  daily . Interventions: o Discussed LDL goal o Recommend patient have discussion with cardiologist about statin therapy noting hx of CAD . Patient self care activities - Over the next 90 days, patient will: o Speak to cardiologist about statin therapy  BPH . Pharmacist Clinical Goal(s) o Over the next 90 days, patient will work with PharmD and providers to reduce symptoms associated with BPH . Current regimen:  o Tamsulosin 0.4mg  daily (prescribed, but pt does not have) . Interventions: o Recommended patient to consult with urologist to determine if he should still continue  tamsulosin . Patient self care activities - Over the next 90 days, patient will: o Discuss continuation vs discontinuation of tamsulosin  Jock Itch . Pharmacist Clinical Goal(s) o Over the next 90 days, patient will work with PharmD and providers to reduce symptoms associated with jock itch . Current regimen:  o Nystatin Cream . Interventions: o Collaboration with provider regarding medication management (refill of nystatin pending Dr. Charlett Blake approval) . Patient self care activities - Over the next 90 days, patient will: o Maintain medication regimen for jock itch   Health Maintenance  . Pharmacist Clinical Goal(s) o Over the next 90 days, patient will work with PharmD and providers to complete health maintenance screenings/vaccinations . Interventions: o Recommend patient receive Td booster . Patient self care activities - Over the next 90 days, patient will: o Receive Td booster    Medication management . Pharmacist Clinical Goal(s): o Over the next 90 days, patient will work with PharmD and providers to maintain optimal medication adherence . Current pharmacy: [facility] . Interventions o Comprehensive medication review performed. o Continue current medication management strategy . Patient self care activities - Over the next 90 days, patient will: o Focus on medication adherence by filling and taking medications appropriately  o Take medications as prescribed o Report any questions or concerns to PharmD and/or provider(s)  Initial goal documentation        Mr. Mcphatter was given information about Chronic Care Management services today including:  1. CCM service includes personalized support from designated clinical staff supervised by his physician, including individualized plan of  care and coordination with other care providers 2. 24/7 contact phone numbers for assistance for urgent and routine care needs. 3. Standard insurance, coinsurance, copays and deductibles apply for  chronic care management only during months in which we provide at least 20 minutes of these services. Most insurances cover these services at 100%, however patients may be responsible for any copay, coinsurance and/or deductible if applicable. This service may help you avoid the need for more expensive face-to-face services. 4. Only one practitioner may furnish and bill the service in a calendar month. 5. The patient may stop CCM services at any time (effective at the end of the month) by phone call to the office staff.  Patient agreed to services and verbal consent obtained.   The patient verbalized understanding of instructions, educational materials, and care plan provided today and agreed to receive a mailed copy of patient instructions, educational materials, and care plan.  Telephone follow up appointment with pharmacy team member scheduled for: 11/29/20  Melvenia Beam Kamorah Nevils, [facility]

## 2020-08-30 NOTE — Chronic Care Management (AMB) (Signed)
Chronic Care Management Pharmacy  Name: David Parsons  MRN: [id number] DOB: 1941-01-20  Chief Complaint/ HPI  David Parsons,  80 y.o. , male presents for their Initial CCM visit with the clinical pharmacist via telephone.  PCP : [name], MD (Oversight that patient's last visit w/ Dr. Charlett Blake was on 08/29/2019, will conduct as non billable CCM encounter)  Their chronic conditions include: Hypertension, Hyperlipidemia/CAD, Heart Failure, Anxiety, GERD, BPH, Glaucoma, Anemia, Onychomycosis  Office Visits: 05/10/20: Visit w/ Mackie Pai, PA-C - Hx of UTI, hematuria, and prostatitis, will get repeat UA, psa, and culture. Extend duration of abx and refer to urology  05/03/20: Visit w/ Mackie Pai, PA-C - UTI and prostatitis. Continue cipro.  Repeat labs in 7 days.  Consult Visit: 06/18/20: Podiatry visit w/ Dr. Elisha Ponder - Nail debridement  ED Visit 06/17/20: [facility] - Foreign body sensation in throat. Felt a cheeto was stuck in his throat. No radiopaque foreign body identified. Airways patent. Left cervical carotid atherosclerosis is noted incidentally. Calcified aortic arch appears quite prominent some lamellated calcifications. Discussed cardiac findings and recommendations for nonemergent CT. Encouraged for patient to keep cardio followup. GI cocktail given.  05/01/20: [facility]. Prescribed cipro.   Medications: Outpatient Encounter Medications as of 08/30/2020  Medication Sig  . amLODipine (NORVASC) 10 MG tablet TAKE 1 TABLET BY MOUTH EVERY Lakisha Peyser  . ammonium lactate (AMLACTIN) 12 % lotion Apply 1 application topically 2 (two) times daily.  Marland Kitchen aspirin EC 81 MG tablet Take 81 mg by mouth daily.  . carvedilol (COREG) 25 MG tablet Take 1 tablet (25 mg total) by mouth 2 (two) times daily with a meal.  . clobetasol ointment (TEMOVATE) 0.05 % apply to affected area twice a Demari Kropp UPON ITCHING AND  IRRITATION(NOT TO FACE OR GROIN)  . diazepam (VALIUM) 5 MG tablet TAKE 1/2-2 TABLETS BY MOUTH DAILY AS NEEDED FOR ANXIETY.  . ferrous sulfate 325 (65 FE) MG tablet Take 1 tablet (325 mg total) by mouth 2 (two) times daily with a meal.  . furosemide (LASIX) 40 MG tablet Take 1 tablet (40 mg total) by mouth 2 (two) times daily.  . hydrocortisone 2.5 % cream Apply 1 application topically 2 (two) times daily.   Marland Kitchen KLOR-CON M20 20 MEQ tablet TAKE 3 TABLETS (60 MEQ TOTAL) BY MOUTH 3 (THREE) TIMES DAILY.  Marland Kitchen losartan (COZAAR) 100 MG tablet Take 1 tablet (100 mg total) by mouth daily. Please keep upcoming appt in March 2022 with Dr. Angelena Form before anymore refills. Thank you  . Multiple Vitamin (MULITIVITAMIN WITH MINERALS) TABS Take 1 tablet by mouth daily.  Marland Kitchen nystatin cream (MYCOSTATIN) Apply 1 application topically 2 (two) times daily.  Marland Kitchen omeprazole (PRILOSEC) 40 MG capsule TAKE 1 CAPSULE BY MOUTH EVERY Alando Colleran  . timolol (TIMOPTIC) 0.5 % ophthalmic solution INSTILL 1 DROP INTO BOTH EYES TWICE A Talise Sligh  . triamterene-hydrochlorothiazide (MAXZIDE-25) 37.5-25 MG tablet Take 1 tablet by mouth daily.  . [DISCONTINUED] azelastine (ASTELIN) 0.1 % nasal spray Place 2 sprays into both nostrils 2 (two) times daily. Use in each nostril as directed  . tamsulosin (FLOMAX) 0.4 MG CAPS capsule Take 1 capsule (0.4 mg total) by mouth daily. (Patient not taking: No sig reported)  . [DISCONTINUED] augmented betamethasone dipropionate (DIPROLENE-AF) 0.05 % cream Apply topically 2 (two) times daily.  . [DISCONTINUED] ciclopirox (LOPROX) 0.77 % cream Apply to both feet and between toes twice daily for 4  weeks.  . [DISCONTINUED] ciprofloxacin (CIPRO) 500 MG tablet Take 1 tablet (500 mg total) by mouth 2 (two) times daily.  . [DISCONTINUED] diclofenac sodium (VOLTAREN) 1 % GEL Apply 4 g topically 4 (four) times daily. To affected joint.  . [DISCONTINUED] fluticasone (FLONASE) 50 MCG/ACT nasal spray Place 2 sprays into both nostrils  daily.  . [DISCONTINUED] levocetirizine (XYZAL) 5 MG tablet TAKE 1 TABLET EVERY EVENING   No facility-administered encounter medications on file as of 08/30/2020.   SDOH Screenings   Alcohol Screen: Not on file  Depression (PHQ2-9): Low Risk   . PHQ-2 Score: 0  Financial Resource Strain: Not on file  Food Insecurity: Not on file  Housing: Not on file  Physical Activity: Not on file  Social Connections: Not on file  Stress: Not on file  Tobacco Use: Low Risk   . Smoking Tobacco Use: Never Smoker  . Smokeless Tobacco Use: Never Used  Transportation Needs: Not on file     Current Diagnosis/Assessment:  Goals Addressed            This Visit's Progress   . Chronic Care Management Pharmacy Care Plan       CARE PLAN ENTRY (see longitudinal plan of care for additional care plan information)  Current Barriers:  . Chronic Disease Management support, education, and care coordination needs related to Hypertension, Hyperlipidemia/CAD, Heart Failure, Anxiety, GERD, BPH, Glaucoma, Anemia, Onychomycosis   Hypertension BP Readings from Last 3 Encounters:  06/17/20 (!) 161/92  05/10/20 130/70  05/03/20 (!) 143/85   . Pharmacist Clinical Goal(s): o Over the next 90 days, patient will work with PharmD and providers to achieve BP goal <130/80 . Current regimen:  . Carvedilol 95m twice daily  . Triamterene/hctz 37.5/25mg daily . Amlodipine 119mdaily . Losartan 10037maily . Interventions: o Discussed BP goal o Requested patient to record BP readings . Patient self care activities - Over the next 90 days, patient will: o Check BP 3 times week, document, and provide at future appointments o Ensure daily salt intake < 2300 mg/Olivea Sonnen  Hyperlipidemia/Hx of CAD Lab Results  Component Value Date/Time   LDLCALC 96 04/22/2019 08:32 AM   . Pharmacist Clinical Goal(s): o Over the next 90 days, patient will work with PharmD and providers to achieve LDL goal < 70 . Current regimen:   o Aspirin 69m37mily . Interventions: o Discussed LDL goal o Recommend patient have discussion with cardiologist about statin therapy noting hx of CAD . Patient self care activities - Over the next 90 days, patient will: o Speak to cardiologist about statin therapy  BPH . Pharmacist Clinical Goal(s) o Over the next 90 days, patient will work with PharmD and providers to reduce symptoms associated with BPH . Current regimen:  o Tamsulosin 0.4mg 14mly (prescribed, but pt does not have) . Interventions: o Recommended patient to consult with urologist to determine if he should still continue tamsulosin . Patient self care activities - Over the next 90 days, patient will: o Discuss continuation vs discontinuation of tamsulosin  Jock Itch . Pharmacist Clinical Goal(s) o Over the next 90 days, patient will work with PharmD and providers to reduce symptoms associated with jock itch . Current regimen:  o Nystatin Cream . Interventions: o Collaboration with provider regarding medication management (refill of nystatin pending Dr. BlythCharlett Blakeoval) . Patient self care activities - Over the next 90 days, patient will: o Maintain medication regimen for jock itch   Health Maintenance  . Pharmacist Clinical Goal(s)  o Over the next 90 days, patient will work with PharmD and providers to complete health maintenance screenings/vaccinations . Interventions: o Recommend patient receive Td booster . Patient self care activities - Over the next 90 days, patient will: o Receive Td booster    Medication management . Pharmacist Clinical Goal(s): o Over the next 90 days, patient will work with PharmD and providers to maintain optimal medication adherence . Current [facility] . Interventions o Comprehensive medication review performed. o Continue current medication management strategy . 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 No apparent reason for D/C) Patient is currently uncontrolled per clinic BP, but states home BP is at goal on the following medications:  . Carvedilol 1m twice daily  . Triamterene/hctz 37.5/25mg daily . Amlodipine 17mdaily . Losartan 10072maily  We discussed BP goal  Plan -Continue current medications  -Check BP 3 times weekly and record   Hyperlipidemia/CAD   LDL goal < 70  Last lipids Lab Results  Component Value Date   CHOL 156 04/22/2019   HDL 35.30 (L) 04/22/2019   LDLCALC 96 04/22/2019   TRIG 119.0 04/22/2019   CHOLHDL 4 04/22/2019   Hepatic Function Latest Ref Rng & Units 10/28/2019 04/22/2019 08/12/2018  Total Protein 6.0 - 8.3 g/dL 6.9 6.8 6.6  Albumin 3.5 - 5.2 g/dL 3.9 4.0 3.9  AST 0 - 37 U/L _0 ALT 0 - 53 U/L _1 Alk Phosphatase 39 - 117 U/L 72 71 61  Total Bilirubin 0.2 - 1.2 mg/dL 1.0 1.1 1.3(H)  Bilirubin, Direct 0.0 - 0.3 mg/dL - - -     The 10-year ASCVD risk score (GoMikey Bussing Jr., et al., 2013) is: 56.8%   Values used to calculate the score:     [age]     Sex: Male     Is Non-Hispanic African American: Yes     Diabetic: Yes     Tobacco smoker: No     Systolic Blood Pressure: 161025Hg     Is BP treated: Yes     HDL Cholesterol: 35.3 mg/dL     Total Cholesterol: 156 mg/dL   Patient has failed these meds in past: None noted Patient  is currently controlled on the following medications:  . Aspirin 62m32mily  Tried statin before?? No Pt states he has a visit with Dr. McAlAngelena Formt month  We discussed:  LDL goal and benefit of statin therapy noting history of CAD  Plan -Continue current medications  -Discuss statin therapy noting history of CAD with cardiologist  Heart Failure   Type: Diastolic  Last ejection fraction: 04/17/2019 55-60% NYHA Class: II (slight limitation of activity) AHA HF Stage: B (Heart disease present - no symptoms present)  Patient has failed these  meds in past: hydralazine (listed in D/C meds. No apparent reason for D/C) Patient is currently controlled on the following medications:  Furosemide 80m twice daily  Klor Con 269m #3 tabs three times daily  Often forgets middle of the Johnrobert Foti dose of klor con Denies swelling unless sitting for prolong periods of time SOB when walking up steps Weighs about 2-3 times per week Aware of weight recommendations  We discussed weighing daily; if you gain more than 3 pounds in one Emary Zalar or 5 pounds in one week call your doctor  Plan -Continue current medications   Anxiety/Depression Screening   Dep[facility] screen [facility] 08/30/2020 08/18/2019 08/09/2017  Decreased Interest 0 0 0  Down, Depressed, Hopeless 0 0 0  PHQ - 2 Score 0 0 0  Altered sleeping 0 - -  Tired, decreased energy 0 - -  Change in appetite 0 - -  Feeling bad or failure about yourself  0 - -  Trouble concentrating 0 - -  Moving slowly or fidgety/restless 0 - -  Suicidal thoughts 0 - -  PHQ-9 Score 0 - -  Some recent data might be hidden   Patient has failed these meds in past: None noted  Patient is currently controlled on the following medications:  . Diazepam 58m41m.5-2 tabs daily as needed  Rarely uses Had a hard time caring for his sister who had dementia and cancer of spleen.  Pt and wife cared for her until she died  Plan -Continue current medications   GERD    Patient has failed these meds in past: None noted Patient is currently controlled on the following medications:  . Omeprazole 16m48mily  He feels this is very helpful for him Hx of H. Pylori per patient  Plan -Continue current medications  BPH   PSA  Date Value Ref Range Status  05/10/2020 23.37 (H) < OR = 4.0 ng/mL Final    Comment:    The total PSA value from this assay system is  standardized against the WHO standard. The test  result will be approximately 20% lower when compared  to the equimolar-standardized total PSA (Beckman  Coulter). Comparison of serial PSA results should be  interpreted with this fact in mind. . This test was performed using the Siemens  chemiluminescent method. Values obtained from  different assay methods cannot be used interchangeably. PSA levels, regardless of value, should not be interpreted as absolute evidence of the presence or absence of disease.   05/03/2020 46.25 (H) < OR = 4.0 ng/mL Final    Comment:    The total PSA value from this assay system is  standardized against the WHO standard. The test  result will be approximately 20% lower when compared  to the equimolar-standardized total PSA (Beckman  Coulter). Comparison of serial PSA results should be  interpreted with this fact in mind. . This test was performed using the Siemens  chemiluminescent method. Values obtained from  different assay methods cannot be used interchangeably. PSA levels, regardless of value, should not be interpreted as absolute evidence of the presence or absence of disease.   09/07/2010 7.32 (H) <=4.00 ng/mL Final    Comment:    See lab report for associated comment(s)     Patient has failed these meds in past: None noted  Patient is currently queri uncontrolled, but improving on the following medications:  . Tamsulosin 0.4mg 14mly (prescribed, but pt does not have)  Overnight urination: 2  Bladder Emptying: Yes Stream vs  Dribbling:  Stream   Followed by urology?? Yes, and states he has an appt on 2/16 with Dr. Dorina Hoyer (labs on 09/29/20)  Doesn't have tamsulosin available at home now. Hasn't taken for about 2 months Advised patient to discuss if he needs to continue tamsulosin with Dr. Dorina Hoyer.   Plan -Continue current management  -Ask Dr. Dorina Hoyer if he should continue tamsulosin   Glaucoma   Followed by Dr. Bing Plume  Patient has failed these meds in past: None noted  Patient is currently controlled on the following medications: . Timolol 0.5% 1 drop into both eyes twice daily . Travatan (having issues with cost, receiving samples from office)  Plan -Continue current medications   Anemia   CBC Latest Ref Rng & Units 05/10/2020 05/03/2020 04/22/2019  WBC 3.8 - 10.8 Thousand/uL 10.0 16.5(H) 9.1  Hemoglobin 13.2 - 17.1 g/dL 13.1(L) 13.1(L) 14.2  Hematocrit 38.5 - 50.0 % 40.3 39.3 42.4  Platelets 140 - 400 Thousand/uL 454(H) 255 280.0    Patient has failed these meds in past: None noted  Patient is currently controlled on the following medications: . Ferrous Sulfate 359m twice daily  Plan -Continue current medications   Onychomycosis    Patient has failed these meds in past: None noted  Patient is currently controlled on the following medications:  Ammonium Lactate 12% lotion twice daily (for balls of feet)  Diprolene AF 0.05% cream twice daily  Ciclopirox 0.77% cream Apply to both feet and between toes twice daily x 4 weeks  Plan -Continue current medications   Jock Itch    Patient has failed these meds in past: None noted  Patient is currently uncontrolled on the following medications:  Nystatin cream  Would like to have a refill, doesn't have any remaining. Rash flares when he gets hot, itch is unbearable at night when he gets hot He has completely used the tube he had.   Plan -Continue current medications  -Coordinate with PCP team to see if patient can receive refills of nystatin  cream (realize patient may require visit before refills approved)   Vaccines   Reviewed and discussed patient's vaccination history.    Immunization History  Administered Date(s) Administered  . Fluad Quad(high Dose 65+) 04/22/2019, 05/07/2020  . Influenza Split 05/15/2011, 05/22/2012  . Influenza Whole 06/07/2009, 05/11/2010  . Influenza, High Dose Seasonal PF 04/22/2015, 05/07/2017, 05/14/2018  . Influenza,inj,Quad PF,6+ Mos 05/09/2013, 04/20/2014  . Influenza-Unspecified 05/17/2016  . Moderna Sars-Covid-2 Vaccination 09/20/2019, 10/28/2019, 07/06/2020  . Pneumococcal Conjugate-13 05/09/2013  . Pneumococcal Polysaccharide-23 04/16/2006, 04/22/2015  . Td 04/14/2008   Moderna Vaccine Dates 09/20/19 10/28/19 07/06/20  Discussed Td booster  Plan -Recommended patient receive Td vaccine in pharmacy.  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

## 2020-09-14 ENCOUNTER — Encounter: Payer: Self-pay | Admitting: Family Medicine

## 2020-09-14 ENCOUNTER — Other Ambulatory Visit: Payer: Self-pay | Admitting: Family Medicine

## 2020-09-14 DIAGNOSIS — M79644 Pain in right finger(s): Secondary | ICD-10-CM

## 2020-09-16 NOTE — Telephone Encounter (Signed)
Patient scheduled for 11/08/20 at 240pm

## 2020-09-22 DIAGNOSIS — M79644 Pain in right finger(s): Secondary | ICD-10-CM | POA: Diagnosis not present

## 2020-09-22 DIAGNOSIS — M6530 Trigger finger, unspecified finger: Secondary | ICD-10-CM | POA: Insufficient documentation

## 2020-09-22 DIAGNOSIS — M65331 Trigger finger, right middle finger: Secondary | ICD-10-CM | POA: Diagnosis not present

## 2020-09-24 ENCOUNTER — Ambulatory Visit (INDEPENDENT_AMBULATORY_CARE_PROVIDER_SITE_OTHER): Payer: Medicare Other | Admitting: Podiatry

## 2020-09-24 ENCOUNTER — Other Ambulatory Visit: Payer: Self-pay

## 2020-09-24 ENCOUNTER — Encounter: Payer: Self-pay | Admitting: Podiatry

## 2020-09-24 DIAGNOSIS — M79674 Pain in right toe(s): Secondary | ICD-10-CM

## 2020-09-24 DIAGNOSIS — B351 Tinea unguium: Secondary | ICD-10-CM

## 2020-09-24 DIAGNOSIS — M79675 Pain in left toe(s): Secondary | ICD-10-CM

## 2020-09-24 NOTE — Progress Notes (Signed)
Subjective:  Patient ID: David Parsons, male    DOB: 05-21-1941,  MRN: [id number]  David Parsons presents to clinic today for painful callus(es) b/l and painful thick toenails that are difficult to trim. Painful toenails interfere with ambulation. Aggravating factors include wearing enclosed shoe gear. Pain is relieved with periodic professional debridement. Painful calluses are aggravated when weightbearing with and without shoegear. Pain is relieved with periodic professional debridement.  80 y.o. male presents with the above complaint. It has been confirmed that patient is not diabetic.  Review of Systems: Negative except as noted in the HPI. Past Medical History:  Diagnosis Date  . Aortic insufficiency     02/16/09 Chest CT:  The aortic root has a diameter measuring 4.6 cm, image 64 of the coronal series.  The ascending    thoracic aorta has a diameter of 3.8 cm, image 65 of the coronal series.  At the level of the aortic arch    the thoracic aorta has a maximum diameter of 3.2 cm.  The descending thoracic aorta has a maximum           diameter of 3.2 cm.  There is no evidence for aortic disse        . Aortic root enlargement (HCC)    The aortic root has a diameter measuring 4.6 cm, image 64 of the coronal series.  The ascending  thoracic aorta has a diameter of 3.8 cm, image 65 of the coronal series.  At the level of the aortic arch   the thoracic aorta has a maximum diameter of 3.2 cm.  The descending thoracic aorta has a maximum   diameter of 3.2 cm.  There is no evidence for aortic dissection.       Marland Kitchen CAD (coronary artery disease)   . Cardiomyopathy     01/2009 - Cath - nonobs. dzs.  EF 35%.  . CHF (congestive heart failure) ([place])   . Diabetes mellitus   . Dyslipidemia 01/14/2013  . Gallstones    s/p cholecystectomy    . GERD (gastroesophageal reflux disease)   . Glaucoma 10/05/2016  . Heart murmur   . Helicobacter pylori gastritis    (Tx Pylera 4/10)  . Hypertension   .  Hypokalemia   . Iron deficiency anemia   . Obesity, Class III, BMI 40-49.9 (morbid obesity) ([place])   . Onychomycosis 03/09/2015  . PONV (postoperative nausea and vomiting)   . 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 Sleep apnea    occasionally wears CPAP at night   Past Surgical History:  Procedure Laterality Date  . ADRENALECTOMY    . BENTALL PROCEDURE  12/28/2011   Procedure: BENTALL PROCEDURE;  Surgeon: Rexene Alberts, MD;  Location: [facility];  Service: Open Heart Surgery;  Laterality: N/A;  . CARDIAC CATHETERIZATION     2013  . CARDIAC VALVE REPLACEMENT  12/28/2011   Bentall with tissue valve  . CHOLECYSTECTOMY    . COLONOSCOPY  09/25/2011  . COLONOSCOPY W/ BIOPSIES AND POLYPECTOMY  11/04/2008   colon polyps, diverticulosis   . CORONARY ARTERY BYPASS GRAFT  12/28/2011   Procedure: CORONARY ARTERY BYPASS GRAFTING (CABG);  Surgeon: Rexene Alberts, MD;  Location: [facility];  Service: Open Heart Surgery;  Laterality: N/A;  cabg x 1  . LEFT AND RIGHT HEART CATHETERIZATION WITH CORONARY ANGIOGRAM N/A 11/22/2011   Procedure: LEFT AND RIGHT HEART CATHETERIZATION WITH CORONARY ANGIOGRAM;  Surgeon: Burnell Blanks, MD;  Location: [facility] C[facility] LAB;  Service: Cardiovascular;  Laterality: N/A;  . MULTIPLE TOOTH EXTRACTIONS    . TEE WITHOUT CARDIOVERSION  11/22/2011   Procedure: TRANSESOPHAGEAL ECHOCARDIOGRAM (TEE);  Surgeon: Larey Dresser, MD;  Location: [facility];  Service: Cardiovascular;  Laterality: N/A;  . UPPER GASTROINTESTINAL ENDOSCOPY  11/04/2008   w/bx, H pylori gastritis  . UPPER GASTROINTESTINAL ENDOSCOPY  09/25/2011    Current Outpatient Medications:  .  amLODipine (NORVASC) 10 MG tablet, TAKE 1 TABLET BY MOUTH EVERY DAY, Disp: 90 tablet, Rfl:  1 .  ammonium lactate (AMLACTIN) 12 % lotion, Apply 1 application topically 2 (two) times daily., Disp: 400 g, Rfl: 4 .  aspirin EC 81 MG tablet, Take 81 mg by mouth daily., Disp: , Rfl:  .  carvedilol (COREG) 25 MG tablet, Take 1 tablet (25 mg total) by mouth 2 (two) times daily with a meal., Disp: 180 tablet, Rfl: 0 .  clobetasol ointment (TEMOVATE) 0.05 %, apply to affected area twice a day UPON ITCHING AND IRRITATION(NOT TO FACE OR GROIN), Disp: , Rfl: 0 .  diazepam (VALIUM) 5 MG tablet, TAKE 1/2-2 TABLETS BY MOUTH DAILY AS NEEDED FOR ANXIETY., Disp: 10 tablet, Rfl: 0 .  ferrous sulfate 325 (65 FE) MG tablet, Take 1 tablet (325 mg total) by mouth 2 (two) times daily with a meal., Disp: 180 tablet, Rfl: 0 .  furosemide (LASIX) 40 MG tablet, Take 1 tablet (40 mg total) by mouth 2 (two) times daily., Disp: 60 tablet, Rfl: 12 .  hydrocortisone 2.5 % cream, Apply 1 application topically 2 (two) times daily. , Disp: , Rfl: 0 .  KLOR-CON M20 20 MEQ tablet, TAKE 3 TABLETS (60 MEQ TOTAL) BY MOUTH 3 (THREE) TIMES DAILY., Disp: 810 tablet, Rfl: 1 .  losartan (COZAAR) 100 MG tablet, Take 1 tablet (100 mg total) by mouth daily. Please keep upcoming appt in March 2022 with Dr. Angelena Form before anymore refills. Thank you, Disp: 90 tablet, Rfl: 1 .  Multiple Vitamin (MULITIVITAMIN WITH MINERALS) TABS, Take 1 tablet by mouth daily., Disp: , Rfl:  .  nystatin cream (MYCOSTATIN), Apply 1 application topically 2 (two) times daily., Disp: 30 g, Rfl: 1 .  omeprazole (PRILOSEC) 40 MG capsule, TAKE 1 CAPSULE BY MOUTH EVERY DAY, Disp: 90 capsule, Rfl: 3 .  tamsulosin (FLOMAX) 0.4 MG CAPS capsule, Take 1 capsule (0.4 mg total) by mouth daily. (Patient not taking: No sig reported), Disp: 30 capsule, Rfl: 0 .  timolol (TIMOPTIC) 0.5 % ophthalmic solution, INSTILL 1 DROP INTO BOTH EYES TWICE A DAY, Disp: , Rfl:  .  triamterene-hydrochlorothiazide (MAXZIDE-25) 37.5-25 MG tablet, Take 1 tablet by mouth daily., Disp: 90 tablet,  Rfl: 0 Allergies  Allergen Reactions  . 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 Smoking status: Never Smoker  . Smokeless tobacco: Never Used  Vaping Use  . Vaping Use: Never used  Substance and Sexual Activity  . Alcohol use: No  . Drug use: No  . 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 African American male, morbidly obese in NAD. AAO x 3.   Vascular Capillary refill time to digits immediate b/l. Palpable pedal pulses b/l LE. Pedal hair absent. Lower extremity skin temperature gradient within normal limits. No pain with calf compression b/l. Trace edema noted b/l lower extremities. No cyanosis or clubbing noted.  Neurologic Normal speech. Oriented to person, place, and time. Protective sensation intact 5/5 intact bilaterally with 10g monofilament b/l. Vibratory sensation intact b/l. Proprioception intact bilaterally.  Dermatologic Pedal skin with normal turgor, texture and tone bilaterally. No open wounds bilaterally. No interdigital macerations bilaterally. Toenails 1-5 b/l elongated, discolored, dystrophic, thickened, crumbly with subungual debris and tenderness to dorsal palpation. Hyperkeratotic lesion(s) submet head 5 left foot and submet head 5 right foot.  No erythema, no edema, no drainage, no flocculence.  Orthopedic: Normal muscle strength 5/5 to all lower extremity muscle groups bilaterally. No pain crepitus or joint limitation noted with ROM b/l. Hammertoes noted to the b/l lower extremities. Pes planus deformity noted b/l.    Radiographs: None Assessment:   No diagnosis found. Plan:  Patient was evaluated and treated and all questions answered.  Onychomycosis with pain -Nails palliatively debridement as below -Educated on  self-care  Procedure: Nail Debridement Rationale: Pain Type of Debridement: manual, sharp debridement. Instrumentation: Nail nipper, rotary burr. Number of Nails: 10 -Examined patient. -Patient declined callus paring today. Medicare ABN signed for this year. Copy given to patient on today's visit and copy placed in patient's chart. -Toenails 1-5 b/l were debrided in length and girth with sterile nail nippers and dremel without iatrogenic bleeding.  -Patient to report any pedal injuries to medical professional immediately. -Patient to continue soft, supportive shoe gear daily. -Patient/POA to call should there be question/concern in the interim.  Return in about 3 months (around 12/22/2020).  Marzetta Board, DPM

## 2020-09-29 DIAGNOSIS — R972 Elevated prostate specific antigen [PSA]: Secondary | ICD-10-CM | POA: Diagnosis not present

## 2020-10-06 DIAGNOSIS — R972 Elevated prostate specific antigen [PSA]: Secondary | ICD-10-CM | POA: Diagnosis not present

## 2020-10-06 DIAGNOSIS — N200 Calculus of kidney: Secondary | ICD-10-CM | POA: Diagnosis not present

## 2020-10-08 DIAGNOSIS — H401132 Primary open-angle glaucoma, bilateral, moderate stage: Secondary | ICD-10-CM | POA: Diagnosis not present

## 2020-10-08 DIAGNOSIS — H524 Presbyopia: Secondary | ICD-10-CM | POA: Diagnosis not present

## 2020-10-08 DIAGNOSIS — H52223 Regular astigmatism, bilateral: Secondary | ICD-10-CM | POA: Diagnosis not present

## 2020-10-08 DIAGNOSIS — H25813 Combined forms of age-related cataract, bilateral: Secondary | ICD-10-CM | POA: Diagnosis not present

## 2020-10-08 DIAGNOSIS — H04123 Dry eye syndrome of bilateral lacrimal glands: Secondary | ICD-10-CM | POA: Diagnosis not present

## 2020-10-25 ENCOUNTER — Ambulatory Visit (INDEPENDENT_AMBULATORY_CARE_PROVIDER_SITE_OTHER): Payer: Medicare Other | Admitting: Cardiovascular Disease

## 2020-10-25 ENCOUNTER — Encounter: Payer: Self-pay | Admitting: Cardiovascular Disease

## 2020-10-25 ENCOUNTER — Other Ambulatory Visit: Payer: Self-pay

## 2020-10-25 VITALS — BP 114/86 | HR 66 | Ht 73.0 in | Wt 310.0 lb

## 2020-10-25 DIAGNOSIS — I2510 Atherosclerotic heart disease of native coronary artery without angina pectoris: Secondary | ICD-10-CM

## 2020-10-25 DIAGNOSIS — I5032 Chronic diastolic (congestive) heart failure: Secondary | ICD-10-CM | POA: Diagnosis not present

## 2020-10-25 DIAGNOSIS — I359 Nonrheumatic aortic valve disorder, unspecified: Secondary | ICD-10-CM

## 2020-10-25 DIAGNOSIS — I10 Essential (primary) hypertension: Secondary | ICD-10-CM

## 2020-10-25 NOTE — Progress Notes (Signed)
Chief Complaint  Patient presents with  . 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 He was referred in May 2010 for SOB, abdominal swelling and LE edema. He was diagnosed with a non-ischemic CM, moderate AI, non-obstructive CAD and a dilated aortic root. His aortic root continued to enlarge. Cardiac cath March 2013 showed enlarged root, no obstructive disease in the left system. I could not engage the RCA secondary to the abnormal takeoff with the enlarged aortic root. CTA was performed and showed dilated aortic root with patent RCA. He underwent aortic root replacement, aortic valve replacement and SVG to RCA on 12/28/11.  Echo August 2020 with LVEF=55-60%. Severe LVH.  AVR working well.   He is here today for follow up. The patient denies any chest pain, dyspnea, palpitations, lower extremity edema, orthopnea, PND, near syncope or syncope. He has some sinus issues and had a little dizziness when he got here today but that is rare for him.   Primary Care Physician: [name], MD  Past Medical History:  Diagnosis Date  . Aortic insufficiency     02/16/09 Chest CT:  The aortic root has a diameter measuring 4.6 cm, image 64 of the coronal series.  The ascending    thoracic aorta has a diameter of 3.8 cm, image 65 of the coronal series.  At the level of the aortic arch    the thoracic aorta has a maximum diameter of 3.2 cm.  The descending thoracic aorta has a maximum           diameter of 3.2 cm.  There is no evidence for aortic disse        . Aortic root enlargement (HCC)    The aortic root has a diameter measuring 4.6 cm, image 64 of the coronal series.  The ascending  thoracic aorta has a diameter of  3.8 cm, image 65 of the coronal series.  At the level of the aortic arch   the thoracic aorta has a maximum diameter of 3.2 cm.  The descending thoracic aorta has a maximum   diameter of 3.2 cm.  There is no evidence for aortic dissection.       Marland Kitchen CAD (coronary artery disease)   . Cardiomyopathy     01/2009 - Cath - nonobs. dzs.  EF 35%.  . CHF (congestive heart failure) ([place])   . Diabetes mellitus   . Dyslipidemia 01/14/2013  . Gallstones    s/p cholecystectomy    . GERD (gastroesophageal reflux disease)   . Glaucoma 10/05/2016  . Heart murmur   . Helicobacter pylori gastritis    (Tx Pylera 4/10)  . Hypertension   . Hypokalemia   . Iron deficiency anemia   . Obesity, Class III, BMI 40-49.9 (morbid obesity) ([place])   . Onychomycosis 03/09/2015  . PONV (postoperative nausea and vomiting)   . 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 Sleep apnea    occasionally  wears CPAP at night    Past Surgical History:  Procedure Laterality Date  . ADRENALECTOMY    . BENTALL PROCEDURE  12/28/2011   Procedure: BENTALL PROCEDURE;  Surgeon: Rexene Alberts, MD;  Location: [facility];  Service: Open Heart Surgery;  Laterality: N/A;  . CARDIAC CATHETERIZATION     2013  . CARDIAC VALVE REPLACEMENT  12/28/2011   Bentall with tissue valve  . CHOLECYSTECTOMY    . COLONOSCOPY  09/25/2011  . COLONOSCOPY W/ BIOPSIES AND POLYPECTOMY  11/04/2008   colon polyps, diverticulosis   . CORONARY ARTERY BYPASS GRAFT  12/28/2011   Procedure: CORONARY ARTERY BYPASS GRAFTING (CABG);  Surgeon: Rexene Alberts, MD;  Location: [facility];  Service: Open Heart Surgery;  Laterality: N/A;  cabg x 1  . LEFT AND RIGHT HEART CATHETERIZATION WITH CORONARY ANGIOGRAM N/A 11/22/2011   Procedure: LEFT AND  RIGHT HEART CATHETERIZATION WITH CORONARY ANGIOGRAM;  Surgeon: Burnell Blanks, MD;  Location: [facility] CATH LAB;  Service: Cardiovascular;  Laterality: N/A;  . MULTIPLE TOOTH EXTRACTIONS    . TEE WITHOUT CARDIOVERSION  11/22/2011   Procedure: TRANSESOPHAGEAL ECHOCARDIOGRAM (TEE);  Surgeon: Larey Dresser, MD;  Location: [facility];  Service: Cardiovascular;  Laterality: N/A;  . UPPER GASTROINTESTINAL ENDOSCOPY  11/04/2008   w/bx, H pylori gastritis  . UPPER GASTROINTESTINAL ENDOSCOPY  09/25/2011    Current Outpatient Medications  Medication Sig Dispense Refill  . amLODipine (NORVASC) 10 MG tablet TAKE 1 TABLET BY MOUTH EVERY DAY 90 tablet 1  . ammonium lactate (AMLACTIN) 12 % lotion Apply 1 application topically 2 (two) times daily. 400 g 4  . aspirin EC 81 MG tablet Take 81 mg by mouth daily.    . carvedilol (COREG) 25 MG tablet Take 1 tablet (25 mg total) by mouth 2 (two) times daily with a meal. 180 tablet 0  . clobetasol ointment (TEMOVATE) 0.05 % apply to affected area twice a day UPON ITCHING AND IRRITATION(NOT TO FACE OR GROIN)  0  . diazepam (VALIUM) 5 MG tablet TAKE 1/2-2 TABLETS BY MOUTH DAILY AS NEEDED FOR ANXIETY. 10 tablet 0  . ferrous sulfate 325 (65 FE) MG tablet Take 1 tablet (325 mg total) by mouth 2 (two) times daily with a meal. 180 tablet 0  . furosemide (LASIX) 40 MG tablet Take 1 tablet (40 mg total) by mouth 2 (two) times daily. 60 tablet 12  . hydrocortisone 2.5 % cream Apply 1 application topically 2 (two) times daily.   0  . KLOR-CON M20 20 MEQ tablet TAKE 3 TABLETS (60 MEQ TOTAL) BY MOUTH 3 (THREE) TIMES DAILY. 810 tablet 1  . losartan (COZAAR) 100 MG tablet Take 1 tablet (100 mg total) by mouth daily. Please keep upcoming appt in March 2022 with Dr. Angelena Form before anymore refills. Thank you 90 tablet 1  . Multiple Vitamin (MULITIVITAMIN WITH MINERALS) TABS Take 1 tablet by mouth daily.    Marland Kitchen nystatin cream (MYCOSTATIN) Apply 1 application topically 2 (two) times  daily. 30 g 1  . omeprazole (PRILOSEC) 40 MG capsule TAKE 1 CAPSULE BY MOUTH EVERY DAY 90 capsule 3  . tamsulosin (FLOMAX) 0.4 MG CAPS capsule Take 1 capsule (0.4 mg total) by mouth daily. 30 capsule 0  . timolol (TIMOPTIC) 0.5 % ophthalmic solution INSTILL 1 DROP INTO BOTH EYES TWICE A DAY    . triamterene-hydrochlorothiazide (MAXZIDE-25) 37.5-25 MG tablet Take 1 tablet by mouth daily. 90 tablet 0   No current facility-administered medications for this visit.  Allergies  Allergen Reactions  . Latex Hives    Latex gloves     Social History   Socioeconomic History  . Marital status: Married    Spouse name: [name]  . Number of children: 0  . Years of education: Not on file  . 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 Smoking status: Never Smoker  . Smokeless tobacco: Never Used  Vaping Use  . Vaping Use: Never used  Substance and Sexual Activity  . Alcohol use: No  . Drug use: No  . Sexual activity: Not Currently    Comment: lives with wife, pastros 2 churches  Other Topics Concern  . Not on file  Social History Narrative   The patient is married, lives with his wife in  [place].  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  He  lives a very sedentary lifestyle.  He is a nonsmoker.  He denies alcohol consumption.    Social Determinants of Health   Financial Resource Strain: Not on file  Food Insecurity: Not on file  Transportation Needs: Not on file  Physical Activity: Not on file  Stress: Not on file  Social Connections: Not on file  Intimate Partner Violence: Not on file    Family History  Problem Relation Age of Onset  . Breast cancer Sister   . Diabetes Sister   . Colon cancer Brother 70       at least 76  . Diabetes Brother   . Cancer Brother        colon, prostate  . Prostate cancer Brother   . Diabetes Brother   . Diabetes  Sister   . Cancer Sister        breast, colon, melanoma  . Heart disease Sister   . Hypertension Mother   . Arthritis Mother   . Kidney failure Sister   . Colon cancer Sister   . Breast cancer Sister   . Kidney disease Sister        s/p transplant  . Hypertension Sister   . Diabetes Sister   . Stroke Father   . Hypertension Father     Review of Systems:  As stated in the HPI and otherwise negative.   BP 114/86   Pulse 66   Ht 6\' 1"  (1.854 m)   Wt (!) 310 lb (140.6 kg)   SpO2 94%   BMI 40.90 kg/m   Physical Examination:  General: Well developed, well nourished, NAD  HEENT: OP clear, mucus membranes moist  SKIN: warm, dry. No rashes. Neuro: No focal deficits  Musculoskeletal: Muscle strength 5/5 all ext  Psychiatric: Mood and affect normal  Neck: No JVD, no carotid bruits, no thyromegaly, no lymphadenopathy.  Lungs:Clear bilaterally, no wheezes, rhonci, crackles Cardiovascular: Regular rate and rhythm. No murmurs, gallops or rubs. Abdomen:Soft. Bowel sounds present. Non-tender.  Extremities: No lower extremity edema. Pulses are 2 + in the bilateral DP/PT.  Echo August 2020:  1. The left ventricle has normal systolic function, with an ejection  fraction of 55-60%. The cavity size was normal. There is severely  increased left ventricular wall thickness. Left ventricular diastolic  Doppler parameters are consistent with impaired  relaxation. No evidence of left ventricular regional wall motion  abnormalities.  2. The right ventricle has normal systolic function. The cavity was  moderately enlarged. There is  no increase in right ventricular wall  thickness.  3. S/P 46mm Edwards Magna Ease Pericardial tissue valve which is poorly  visualized but appear to have grossly normal function with some degree of  leaflet thickening. The AV Mean Grad:14.2 mmHg.  4. The aorta is normal unless otherwise noted.   EKG:  EKG is ordered today. The ekg ordered today demonstrates  sinus, RBBB  Recent Labs: 10/28/2019: ALT 14; BUN 13; Creatinine, Ser 0.99; Potassium 3.4; Sodium 139 05/10/2020: Hemoglobin 13.1; Platelets 454   Lipid Panel    Component Value Date/Time   CHOL 156 04/22/2019 0832   TRIG 119.0 04/22/2019 0832   HDL 35.30 (L) 04/22/2019 0832   CHOLHDL 4 04/22/2019 0832   VLDL 23.8 04/22/2019 0832   LDLCALC 96 04/22/2019 0832     Wt Readings from Last 3 Encounters:  10/25/20 (!) 310 lb (140.6 kg)  05/10/20 (!) 317 lb (143.8 kg)  05/03/20 (!) 312 lb 6.4 oz (141.7 kg)     Other studies Reviewed: Additional studies/ records that were reviewed today include: . Review of the above records demonstrates:   Assessment and Plan:   1. CAD s/p CABG without angina: He had a single vessel bypass to RCA at the time of his root replacement and AVR. No significant coronary atherosclerosis noted prior to his surgery.He has no chest pain. Will continue ASA and beta blocker. He has not been on a statin. (no CAD noted on pre surgery cath).     2. Chronic diastolic CHF:  Weight is stable. No volume overload on exam. Continue Lasix  3. HYPERTENSION: BP has been managed in primary care. BP is well controlled. No changes  4. S/P aortic valve replacement and aortoplasty: LV function normal by echo August 2020. AVR working well with mild expected gradient across valve. He will continue using SBP prophylaxis prior to dental procedures.   Current medicines are reviewed at length with the patient today.  The patient does not have concerns regarding medicines.  The following changes have been made:  no change  Labs/ tests ordered today include:   Orders Placed This Encounter  Procedures  . EKG 12-Lead    Disposition:   F/U with me in 12 months  Signed, Lauree Chandler, MD 10/25/2020 4:57 PM    [facility] [address], [place], [place]  [id number] Phone: [phone]; Fax: [phone]

## 2020-10-25 NOTE — Patient Instructions (Signed)

## 2020-10-26 DIAGNOSIS — H401132 Primary open-angle glaucoma, bilateral, moderate stage: Secondary | ICD-10-CM | POA: Diagnosis not present

## 2020-10-31 ENCOUNTER — Other Ambulatory Visit: Payer: Self-pay | Admitting: Family Medicine

## 2020-11-03 DIAGNOSIS — M65331 Trigger finger, right middle finger: Secondary | ICD-10-CM | POA: Diagnosis not present

## 2020-11-08 ENCOUNTER — Ambulatory Visit: Payer: Medicare Other | Admitting: Family Medicine

## 2020-11-15 DIAGNOSIS — H401132 Primary open-angle glaucoma, bilateral, moderate stage: Secondary | ICD-10-CM | POA: Diagnosis not present

## 2020-11-15 DIAGNOSIS — H25813 Combined forms of age-related cataract, bilateral: Secondary | ICD-10-CM | POA: Diagnosis not present

## 2020-11-15 DIAGNOSIS — H04123 Dry eye syndrome of bilateral lacrimal glands: Secondary | ICD-10-CM | POA: Diagnosis not present

## 2020-11-23 ENCOUNTER — Other Ambulatory Visit: Payer: Self-pay

## 2020-11-23 ENCOUNTER — Telehealth (INDEPENDENT_AMBULATORY_CARE_PROVIDER_SITE_OTHER): Payer: Medicare Other | Admitting: Family Medicine

## 2020-11-23 DIAGNOSIS — E785 Hyperlipidemia, unspecified: Secondary | ICD-10-CM | POA: Diagnosis not present

## 2020-11-23 DIAGNOSIS — I509 Heart failure, unspecified: Secondary | ICD-10-CM | POA: Diagnosis not present

## 2020-11-23 DIAGNOSIS — D509 Iron deficiency anemia, unspecified: Secondary | ICD-10-CM

## 2020-11-23 DIAGNOSIS — I10 Essential (primary) hypertension: Secondary | ICD-10-CM

## 2020-11-23 DIAGNOSIS — R739 Hyperglycemia, unspecified: Secondary | ICD-10-CM | POA: Diagnosis not present

## 2020-11-23 DIAGNOSIS — R972 Elevated prostate specific antigen [PSA]: Secondary | ICD-10-CM | POA: Diagnosis not present

## 2020-11-23 DIAGNOSIS — I119 Hypertensive heart disease without heart failure: Secondary | ICD-10-CM | POA: Diagnosis not present

## 2020-11-24 NOTE — Assessment & Plan Note (Signed)
Encouraged to monitor and report any concerns, no changes to meds. Encouraged heart healthy diet such as the DASH diet and exercise as tolerated. He was supposed to be in for an inperson visit today but at the last minute had car trouble so his visit is completed virtually and he agrees to come to office soon for lab work.

## 2020-11-24 NOTE — Progress Notes (Signed)
Virtual telephone visit    Virtual Visit via Telephone Note   This visit type was conducted due to national recommendations for restrictions regarding the COVID-19 Pandemic (e.g. social distancing) in an effort to limit this patient's exposure and mitigate transmission in our community. Due to his co-morbid illnesses, this patient is at least at moderate risk for complications without adequate follow up. This format is felt to be most appropriate for this patient at this time. The patient did not have access to video technology or had technical difficulties with video requiring transitioning to audio format only (telephone). Physical exam was limited to content and character of the telephone converstion. Nena Alexander, CMA was able to get the patient set up on a telephone visit.   Patient location: home Patient, and wife and provider in visit Provider location: Office  I discussed the limitations of evaluation and management by telemedicine and the availability of in person appointments. The patient expressed understanding and agreed to proceed.   Visit Date: 11/23/2020  Today's healthcare provider: [name], MD     Subjective:    Patient ID: David Parsons, male    DOB: May 24, 1941, 80 y.o.   MRN: [id number]  No chief complaint on file.   HPI Patient is in today for follow up on chronic medical concerns. No recent febrile illness or hospitalizations. He is trying to follow a heart healthy diet. He has been walking a bit more. He reports he feels well. No c/o polyuria or polydipsia. Denies CP/palp/SOB/HA/congestion/fevers/GI or GU c/o. Taking meds as prescribed  Past Medical History:  Diagnosis Date  . Aortic insufficiency     02/16/09 Chest CT:  The aortic root has a diameter measuring 4.6 cm, image 64 of the coronal series.  The ascending    thoracic aorta has a diameter of 3.8 cm, image 65 of the coronal series.  At the level of the aortic arch    the thoracic aorta has a  maximum diameter of 3.2 cm.  The descending thoracic aorta has a maximum           diameter of 3.2 cm.  There is no evidence for aortic disse        . Aortic root enlargement (HCC)    The aortic root has a diameter measuring 4.6 cm, image 64 of the coronal series.  The ascending  thoracic aorta has a diameter of 3.8 cm, image 65 of the coronal series.  At the level of the aortic arch   the thoracic aorta has a maximum diameter of 3.2 cm.  The descending thoracic aorta has a maximum   diameter of 3.2 cm.  There is no evidence for aortic dissection.       Marland Kitchen CAD (coronary artery disease)   . Cardiomyopathy     01/2009 - Cath - nonobs. dzs.  EF 35%.  . CHF (congestive heart failure) ([place])   . Diabetes mellitus   . Dyslipidemia 01/14/2013  . Gallstones    s/p cholecystectomy    . GERD (gastroesophageal reflux disease)   . Glaucoma 10/05/2016  . Heart murmur   . Helicobacter pylori gastritis    (Tx Pyl[place] 4/10)  . Hypertension   . Hypokalemia   . Iron deficiency anemia   . Obesity, Class III, BMI 40-49.9 (morbid obesity) ([place])   . Onychomycosis 03/09/2015  . PONV (postoperative nausea and vomiting)   . 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 Sleep apnea    occasionally wears CPAP at night    Past Surgical History:  Procedure Laterality Date  . ADRENALECTOMY    . BENTALL PROCEDURE  12/28/2011   Procedure: BENTALL PROCEDURE;  Surgeon: Rexene Alberts, MD;  Location: [place];  Service: Open Heart Surgery;  Laterality: N/A;  . CARDIAC CATHETERIZATION     2013  . CARDIAC VALVE REPLACEMENT  12/28/2011   Bentall with tissue valve  . CHOLECYSTECTOMY    . COLONOSCOPY  09/25/2011  . COLONOSCOPY W/ BIOPSIES AND POLYPECTOMY  11/04/2008   colon polyps,  diverticulosis   . CORONARY ARTERY BYPASS GRAFT  12/28/2011   Procedure: CORONARY ARTERY BYPASS GRAFTING (CABG);  Surgeon: Rexene Alberts, MD;  Location: [place];  Service: Open Heart Surgery;  Laterality: N/A;  cabg x 1  . LEFT AND RIGHT HEART CATHETERIZATION WITH CORONARY ANGIOGRAM N/A 11/22/2011   Procedure: LEFT AND RIGHT HEART CATHETERIZATION WITH CORONARY ANGIOGRAM;  Surgeon: Burnell Blanks, MD;  Location[facility] C[facility] LAB;  Service: Cardiovascular;  Laterality: N/A;  . MULTIPLE TOOTH EXTRACTIONS    . TEE WITHOUT CARDIOVERSION  11/22/2011   Procedure: TRANSESOPHAGEAL ECHOCARDIOGRAM (TEE);  Surgeon: Larey Dresser, MD;  Location: [place];  Service: Cardiovascular;  Laterality: N/A;  . UPPER GASTROINTESTINAL ENDOSCOPY  11/04/2008   w/bx, H pylori gastritis  . UPPER GASTROINTESTINAL ENDOSCOPY  09/25/2011    Family History  Problem Relation Age of Onset  . Breast cancer Sister   . Diabetes Sister   . Colon cancer Brother 70       at least 30  . Diabetes Brother   . Cancer Brother        colon, prostate  . Prostate cancer Brother   . Diabetes Brother   . Diabetes Sister   . Cancer Sister        breast, colon, melanoma  . Heart disease Sister   . Hypertension Mother   . Arthritis Mother   . Kidney failure Sister   . Colon cancer Sister   . Breast cancer Sister   . Kidney disease Sister        s/p transplant  . Hypertension Sister   . Diabetes Sister   . Stroke Father   . Hypertension Father     Social History   Socioeconomic History  . Marital status: Married    Spouse name: [name]  . Number of children: 0  . Years of education: Not on file  . 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 Smoking status: Never Smoker  . Smokeless tobacco: Never Used  Vaping Use  . Vaping Use: Never used  Substance and Sexual Activity  . Alcohol use: No  . Drug use: No  .  Sexual activity: Not Currently    Comment: lives with wife, pastros 2 churches  Other Topics Concern  . Not on file  Social History Narrative   The patient is married, lives with his wife in  [place].  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  He  lives a very sedentary lifestyle.  He is a nonsmoker.  He denies alcohol consumption.    Social  Determinants of Health   Financial Resource Strain: Not on file  Food Insecurity: Not on file  Transportation Needs: Not on file  Physical Activity: Not on file  Stress: Not on file  Social Connections: Not on file  Intimate Partner Violence: Not on file    Outpatient Medications Prior to Visit  Medication Sig Dispense Refill  . amLODipine (NORVASC) 10 MG tablet TAKE 1 TABLET BY MOUTH EVERY DAY 90 tablet 1  . ammonium lactate (AMLACTIN) 12 % lotion Apply 1 application topically 2 (two) times daily. 400 g 4  . aspirin EC 81 MG tablet Take 81 mg by mouth daily.    . carvedilol (COREG) 25 MG tablet Take 1 tablet (25 mg total) by mouth 2 (two) times daily with a meal. 180 tablet 0  . clobetasol ointment (TEMOVATE) 0.05 % apply to affected area twice a day UPON ITCHING AND IRRITATION(NOT TO FACE OR GROIN)  0  . diazepam (VALIUM) 5 MG tablet TAKE 1/2-2 TABLETS BY MOUTH DAILY AS NEEDED FOR ANXIETY. 10 tablet 0  . ferrous sulfate 325 (65 FE) MG tablet Take 1 tablet (325 mg total) by mouth 2 (two) times daily with a meal. 180 tablet 0  . furosemide (LASIX) 40 MG tablet Take 1 tablet (40 mg total) by mouth 2 (two) times daily. 60 tablet 12  . hydrocortisone 2.5 % cream Apply 1 application topically 2 (two) times daily.   0  . KLOR-CON M20 20 MEQ tablet TAKE 3 TABLETS (60 MEQ TOTAL) BY MOUTH 3 (THREE) TIMES DAILY. 810 tablet 1  . losartan (COZAAR) 100 MG tablet Take 1 tablet (100 mg total) by mouth daily. Please keep upcoming appt in March 2022 with Dr. Angelena Form before anymore refills. Thank you 90 tablet 1  . Multiple Vitamin (MULITIVITAMIN  WITH MINERALS) TABS Take 1 tablet by mouth daily.    Marland Kitchen nystatin cream (MYCOSTATIN) Apply 1 application topically 2 (two) times daily. 30 g 1  . omeprazole (PRILOSEC) 40 MG capsule TAKE 1 CAPSULE BY MOUTH EVERY DAY 90 capsule 3  . tamsulosin (FLOMAX) 0.4 MG CAPS capsule Take 1 capsule (0.4 mg total) by mouth daily. 30 capsule 0  . timolol (TIMOPTIC) 0.5 % ophthalmic solution INSTILL 1 DROP INTO BOTH EYES TWICE A DAY    . triamterene-hydrochlorothiazide (MAXZIDE-25) 37.5-25 MG tablet TAKE 1 TABLET BY MOUTH EVERY DAY 90 tablet 0   No facility-administered medications prior to visit.    Allergies  Allergen Reactions  . Latex Hives    Latex gloves     Review of Systems  Constitutional: Negative for fever and malaise/fatigue.  HENT: Negative for congestion.   Eyes: Negative for blurred vision.  Respiratory: Negative for shortness of breath.   Cardiovascular: Negative for chest pain, palpitations and leg swelling.  Gastrointestinal: Negative for abdominal pain, blood in stool and nausea.  Genitourinary: Negative for dysuria and frequency.  Musculoskeletal: Negative for falls.  Skin: Negative for rash.  Neurological: Negative for dizziness, loss of consciousness and headaches.  Endo/Heme/Allergies: Negative for environmental allergies.  Psychiatric/Behavioral: Negative for depression. The patient is not nervous/anxious.        Objective:    Physical Exam unable to obtain via phone visit  There were no vitals taken for this visit. Wt Readings from Last 3 Encounters:  10/25/20 (!) 310 lb (140.6 kg)  05/10/20 (!) 317 lb (143.8 kg)  05/03/20 (!) 312 lb 6.4 oz (141.7 kg)    Diabetic Foot Exam - Simple   No data filed  Lab Results  Component Value Date   WBC 10.0 05/10/2020   HGB 13.1 (L) 05/10/2020   HCT 40.3 05/10/2020   PLT 454 (H) 05/10/2020   GLUCOSE 115 (H) 10/28/2019   CHOL 156 04/22/2019   TRIG 119.0 04/22/2019   HDL 35.30 (L) 04/22/2019   LDLCALC 96 04/22/2019    ALT 14 10/28/2019   AST 16 10/28/2019   NA 139 10/28/2019   K 3.4 (L) 10/28/2019   CL 100 10/28/2019   CREATININE 0.99 10/28/2019   BUN 13 10/28/2019   CO2 32 10/28/2019   TSH 2.36 04/22/2019   PSA 23.37 (H) 05/10/2020   INR 1.21 01/02/2012   HGBA1C 5.5 10/28/2019    Lab Results  Component Value Date   TSH 2.36 04/22/2019   Lab Results  Component Value Date   WBC 10.0 05/10/2020   HGB 13.1 (L) 05/10/2020   HCT 40.3 05/10/2020   MCV 90.2 05/10/2020   PLT 454 (H) 05/10/2020   Lab Results  Component Value Date   NA 139 10/28/2019   K 3.4 (L) 10/28/2019   CO2 32 10/28/2019   GLUCOSE 115 (H) 10/28/2019   BUN 13 10/28/2019   CREATININE 0.99 10/28/2019   BILITOT 1.0 10/28/2019   ALKPHOS 72 10/28/2019   AST 16 10/28/2019   ALT 14 10/28/2019   PROT 6.9 10/28/2019   ALBUMIN 3.9 10/28/2019   CALCIUM 9.5 10/28/2019   GFR 88.54 10/28/2019   Lab Results  Component Value Date   CHOL 156 04/22/2019   Lab Results  Component Value Date   HDL 35.30 (L) 04/22/2019   Lab Results  Component Value Date   LDLCALC 96 04/22/2019   Lab Results  Component Value Date   TRIG 119.0 04/22/2019   Lab Results  Component Value Date   CHOLHDL 4 04/22/2019   Lab Results  Component Value Date   HGBA1C 5.5 10/28/2019       Assessment & Plan:   Problem List Items Addressed This Visit    Essential hypertension - Primary   Relevant Orders   Comprehensive metabolic panel   TSH   Hypertensive heart disease without heart failure    Encouraged to monitor and report any concerns, no changes to meds. Encouraged heart healthy diet such as the DASH diet and exercise as tolerated. He was supposed to be in for an inperson visit today but at the last minute had car trouble so his visit is completed virtually and he agrees to come to office soon for lab work.       Congestive heart failure (HCC)    No recent exacerbation, he reports he is doing well. No changes to therapy today       Hyperglycemia    hgba1c acceptable, minimize simple carbs. Increase exercise as tolerated.      Relevant Orders   Hemoglobin A1c   CBC   PSA, INCREASED    He is following closely with urology now and is due for a procedure soon.       Relevant Orders   Comprehensive metabolic panel   Iron deficiency anemia   Relevant Orders   Comprehensive metabolic panel   Dyslipidemia    Encouraged heart healthy diet, increase exercise, avoid trans fats, consider a krill oil cap daily      Relevant Orders   Comprehensive metabolic panel   Lipid panel      I am having Elna Breslow. Vanecek maintain his multivitamin with minerals, aspirin EC, clobetasol ointment, hydrocortisone, furosemide,  timolol, ammonium lactate, nystatin cream, Klor-Con M20, omeprazole, diazepam, tamsulosin, losartan, ferrous sulfate, carvedilol, triamterene-hydrochlorothiazide, and amLODipine.  No orders of the defined types were placed in this encounter.    I discussed the assessment and treatment plan with the patient. The patient was provided an opportunity to ask questions and all were answered. The patient agreed with the plan and demonstrated an understanding of the instructions.   The patient was advised to call back or seek an in-person evaluation if the symptoms worsen or if the condition fails to improve as anticipated.  I provided 26 minutes of non-face-to-face time during this encounter.   [name], MD [facility] [phone] (phone) [phone] (fax)  [place]

## 2020-11-24 NOTE — Assessment & Plan Note (Signed)
No recent exacerbation, he reports he is doing well. No changes to therapy today

## 2020-11-24 NOTE — Assessment & Plan Note (Signed)
He is following closely with urology now and is due for a procedure soon.

## 2020-11-24 NOTE — Assessment & Plan Note (Signed)
Encouraged heart healthy diet, increase exercise, avoid trans fats, consider a krill oil cap daily 

## 2020-11-24 NOTE — Assessment & Plan Note (Signed)
hgba1c acceptable, minimize simple carbs. Increase exercise as tolerated.  

## 2020-11-26 DIAGNOSIS — C61 Malignant neoplasm of prostate: Secondary | ICD-10-CM | POA: Diagnosis not present

## 2020-11-26 DIAGNOSIS — R972 Elevated prostate specific antigen [PSA]: Secondary | ICD-10-CM | POA: Diagnosis not present

## 2020-11-28 NOTE — Assessment & Plan Note (Signed)
Monitor and report any concerns. no changes to meds. Encouraged heart healthy diet such as the DASH diet and exercise as tolerated.  

## 2020-11-29 ENCOUNTER — Ambulatory Visit (INDEPENDENT_AMBULATORY_CARE_PROVIDER_SITE_OTHER): Payer: Medicare Other | Admitting: Pharmacist

## 2020-11-29 DIAGNOSIS — I2510 Atherosclerotic heart disease of native coronary artery without angina pectoris: Secondary | ICD-10-CM | POA: Diagnosis not present

## 2020-11-29 DIAGNOSIS — I10 Essential (primary) hypertension: Secondary | ICD-10-CM

## 2020-11-29 DIAGNOSIS — E785 Hyperlipidemia, unspecified: Secondary | ICD-10-CM | POA: Diagnosis not present

## 2020-11-29 DIAGNOSIS — R972 Elevated prostate specific antigen [PSA]: Secondary | ICD-10-CM

## 2020-11-29 DIAGNOSIS — R739 Hyperglycemia, unspecified: Secondary | ICD-10-CM

## 2020-11-29 NOTE — Chronic Care Management (AMB) (Signed)
Chronic Care Management Pharmacy Note  11/29/2020 Name:  David Parsons MRN:  [id number] DOB:  [birth year]  Subjective: David Parsons is an 80 y.o. year old male who is a primary patient of [name], MD.  The CCM team was consulted for assistance with disease management and care coordination needs.    Engaged with patient by telephone for follow up visit in response to provider referral for pharmacy case management and/or care coordination services.   Consent to Services:  The patient was given information about Chronic Care Management services, agreed to services, and gave verbal consent prior to initiation of services.  Please see initial visit note for detailed documentation.   Patient Care Team: [name], MD as PCP - General (Family Medicine) Burnell Blanks, MD as Consulting Physician (Cardiology) Gatha Mayer, MD as Consulting Physician (Gastroenterology) Druscilla Brownie, MD as Consulting Physician (Dermatology) Calvert Cantor, MD as Consulting Physician (Ophthalmology) Carolan Clines, MD (Inactive) as Consulting Physician (Urology) Gardiner Barefoot, DPM as Consulting Physician (Podiatry) [name], PharmD (Pharmacist)  Recent office visits: 11/23/2020 - PCP ([name]) Video visit to f/u chronic medical concerns. Labs ordered - A1c, CBC, lipids, CMP and TSH for future. No medication changes. Recent consult visits: 11/03/2020 - Ortho (Dr Ivin Booty) Follow-up right long trigger finger. Symptoms have resolved after injection. No medication changes   10/25/2020 - Cardio (Dr Angelena Form) for cardiac follow up. No medication changes   09/24/2020 - Podiatry (Dr Elisha Ponder) presents to clinic today for painful callus(es) b/l and painful thick toenails that are difficult to trim. No medication changes Hospital visits: None in previous 6 months  Objective:  Lab Results  Component Value Date   CREATININE 0.99 10/28/2019   CREATININE 0.88 04/22/2019    CREATININE 1.01 08/12/2018    Lab Results  Component Value Date   HGBA1C 5.5 10/28/2019   Last diabetic Eye exam: No results found for: HMDIABEYEEXA  Last diabetic Foot exam: No results found for: HMDIABFOOTEX      Component Value Date/Time   CHOL 156 04/22/2019 0832   TRIG 119.0 04/22/2019 0832   HDL 35.30 (L) 04/22/2019 0832   CHOLHDL 4 04/22/2019 0832   VLDL 23.8 04/22/2019 0832   LDLCALC 96 04/22/2019 0832    Hepatic Function Latest Ref Rng & Units 10/28/2019 04/22/2019 08/12/2018  Total Protein 6.0 - 8.3 g/dL 6.9 6.8 6.6  Albumin 3.5 - 5.2 g/dL 3.9 4.0 3.9  AST 0 - 37 U/L _0 ALT 0 - 53 U/L _1 Alk Phosphatase 39 - 117 U/L 72 71 61  Total Bilirubin 0.2 - 1.2 mg/dL 1.0 1.1 1.3(H)  Bilirubin, Direct 0.0 - 0.3 mg/dL - - -    Lab Results  Component Value Date/Time   TSH 2.36 04/22/2019 08:32 AM   TSH 1.89 05/14/2018 09:47 AM    CBC Latest Ref Rng & Units 05/10/2020 05/03/2020 04/22/2019  WBC 3.8 - 10.8 Thousand/uL 10.0 16.5(H) 9.1  Hemoglobin 13.2 - 17.1 g/dL 13.1(L) 13.1(L) 14.2  Hematocrit 38.5 - 50.0 % 40.3 39.3 42.4  Platelets 140 - 400 Thousand/uL 454(H) 255 280.0    No results found for: VD25OH  Clinical ASCVD: Yes  The 10-year ASCVD risk score Mikey Bussing DC Jr., et al., 2013) is: 35.2%   Values used to calculate the score:     Age: 65 years     Sex: Male     Is Non-Hispanic African American: Yes     Diabetic: Yes  Tobacco smoker: No     Systolic Blood Pressure: 712 mmHg     Is BP treated: Yes     HDL Cholesterol: 35.3 mg/dL     Total Cholesterol: 156 mg/dL    Other: (CHADS2VASc if Afib, PHQ9 if depression, MMRC or CAT for COPD, ACT, DEXA)  Social History   Tobacco Use  Smoking Status Never Smoker  Smokeless Tobacco Never Used   BP Readings from Last 3 Encounters:  10/25/20 114/86  06/17/20 (!) 161/92  05/10/20 130/70   Pulse Readings from Last 3 Encounters:  10/25/20 66  06/17/20 86  05/10/20 72   Wt Readings from Last 3  Encounters:  10/25/20 (!) 310 lb (140.6 kg)  05/10/20 (!) 317 lb (143.8 kg)  05/03/20 (!) 312 lb 6.4 oz (141.7 kg)    Assessment: Review of patient past medical history, allergies, medications, health status, including review of consultants reports, laboratory and other test data, was performed as part of comprehensive evaluation and provision of chronic care management services.   SDOH:  (Social Determinants of Health) assessments and interventions performed:  SDOH Interventions   Flowsheet Row Most Recent Value  SDOH Interventions   Food Insecurity Interventions Intervention Not Indicated  Physical Activity Interventions Other (Comments)  [Discussed increasing physical activity to goal of at leat 150 mintues per week]  Social Connections Interventions Intervention Not Indicated      CCM Care Plan  Allergies  Allergen Reactions  . Latex Hives    Latex gloves     Medications Reviewed Today    Reviewed by [name], PharmD (Pharmacist) on 11/29/20 at 1001  Med List Status: <None>  Medication Order Taking? Sig Documenting Provider Last Dose Status Informant  amLODipine (NORVASC) 10 MG tablet [id number] Yes TAKE 1 TABLET BY MOUTH EVERY DAY [name], MD Taking Active   ammonium lactate (AMLACTIN) 12 % lotion [id number] Yes Apply 1 application topically 2 (two) times daily. Marzetta Board, DPM Taking Active   aspirin EC 81 MG tablet [id number] Yes Take 81 mg by mouth daily. [provider] Taking Active   carvedilol (COREG) 25 MG tablet [id number] Yes Take 1 tablet (25 mg total) by mouth 2 (two) times daily with a meal. [name], MD Taking Active   clobetasol ointment (TEMOVATE) 0.05 % [id number] Yes apply to affected area twice a day UPON ITCHING AND IRRITATION(NOT TO FACE OR GROIN) [provider] Taking Active            Med Note (CANTER, Vilma Prader D   Fri Aug 31, 2017 10:23 AM)    diazepam (VALIUM) 5 MG tablet [id number] Yes TAKE 1/2-2 TABLETS BY  MOUTH DAILY AS NEEDED FOR ANXIETY. Copland, Gay Filler, MD Taking Active   ferrous sulfate 325 (65 FE) MG tablet [id number] Yes Take 1 tablet (325 mg total) by mouth 2 (two) times daily with a meal. [name], MD Taking Active   furosemide (LASIX) 40 MG tablet [id number] Yes Take 1 tablet (40 mg total) by mouth 2 (two) times daily. Burnell Blanks, MD Taking Active   hydrocortisone 2.5 % cream [id number] Yes Apply 1 application topically 2 (two) times daily.  [provider] Taking Active            Med Note Harrel Lemon, Dreonna Hussein T   Thu Sep 14, 2016  8:16 AM)    KLOR-CON M20 20 MEQ tablet [id number] Yes TAKE 3 TABLETS (60 MEQ TOTAL) BY MOUTH 3 (THREE) TIMES DAILY. [name], MD  Taking Active   losartan (COZAAR) 100 MG tablet [id number] Yes Take 1 tablet (100 mg total) by mouth daily. Please keep upcoming appt in March 2022 with Dr. Angelena Form before anymore refills. Thank you Burnell Blanks, MD Taking Active   Multiple Vitamin [facility] WITH MINERALS) TABS [id number] Yes Take 1 tablet by mouth daily. [provider] Taking Active Spouse/Significant Other  nystatin cream (MYCOSTATIN) [id number] Yes Apply 1 application topically 2 (two) times daily. Saguier, Percell Miller, PA-C Taking Active   omeprazole (PRILOSEC) 40 MG capsule [id number] Yes TAKE 1 CAPSULE BY MOUTH EVERY DAY [name], MD Taking Active   tamsulosin (FLOMAX) 0.4 MG CAPS capsule [id number] No Take 1 capsule (0.4 mg total) by mouth daily.  Patient not taking: Reported on 11/29/2020   Robyn Haber, MD Not Taking Active   timolol (TIMOPTIC) 0.5 % ophthalmic solution [id number] Yes INSTILL 1 DROP INTO BOTH EYES TWICE A DAY [provider] Taking Active   triamterene-hydrochlorothiazide (MAXZIDE-25) 37.5-25 MG tablet [id number] Yes TAKE 1 TABLET BY MOUTH EVERY DAY [name], MD Taking Active           Patient Active Problem List   Diagnosis Date Noted  . Trigger finger of right hand  09/22/2020  . Anxiety attack 08/04/2019  . Glaucoma 10/05/2016  . Onychomycosis 03/09/2015  . Hypokalemia 07/26/2014  . Medicare annual wellness visit, subsequent 04/20/2014  . Dyslipidemia 01/14/2013  . 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 Follow-up examination, following unspecified surgery 01/22/2012  . 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 Dystrophic nail 10/23/2011  . Iron deficiency anemia 06/29/2011  . Personal history of colonic polyps 06/29/2011  . Family history of malignant neoplasm of gastrointestinal tract 06/29/2011  . PSA, INCREASED 09/12/2010  . CAD, NATIVE VESSEL 01/18/2010  . Obesity, Class III, BMI 40-49.9 (morbid obesity) ([place]) 11/29/2009  . Essential hypertension 04/05/2009  . Hyperglycemia 04/05/2009  . 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  . AORTIC INSUFFICIENCY, MODERATE 02/17/2009  . CARDIOMYOPATHY, DILATED 02/17/2009  . Thoracic aneurysm without mention of rupture 01/26/2009  . Congestive heart failure ([place]) 01/04/2009  . NONSPECIFIC ABNORMAL ELECTROCARDIOGRAM 01/04/2009    Immunization History  Administered Date(s) Administered  . Fluad Quad(high Dose 65+) 04/22/2019, 05/07/2020  . Influenza Split 05/15/2011, 05/22/2012  . Influenza Whole 06/07/2009, 05/11/2010  . Influenza, High Dose Seasonal PF 04/22/2015, 05/07/2017, 05/14/2018  . Influenza,inj,Quad PF,6+ Mos 05/09/2013, 04/20/2014  . Influenza-Unspecified 05/17/2016  . Moderna Sars-Covid-2 Vaccination 09/20/2019, 10/28/2019, 07/06/2020  . Pneumococcal Conjugate-13 05/09/2013  . Pneumococcal Polysaccharide-23 04/16/2006, 04/22/2015  . Td 04/14/2008    Conditions to be addressed/monitored: CHF, CAD, HTN, HLD, Anxiety and pre DM; glaucoma; elevated PSA; hypokalemia (chronic); iron deficiency anemia  Care Plan : General Pharmacy (Adult)  Updates made by [name], PHARMD since 11/29/2020 12:00 AM    Problem: . Chronic Disease Management support, education,  and care coordination needs related to Hypertension, Hyperlipidemia/CAD, Heart Failure, Anxiety, GERD, BPH, Glaucoma, Anemia   Priority: High  Onset Date: 11/29/2020  Note:   Current Barriers:  . Unable to maintain control of HTN . History of CAD found when having AVR surgery; No current statin therapy  Pharmacist Clinical Goal(s):  Marland Kitchen Over the next 90 days, patient will achieve adherence to monitoring guidelines and medication adherence to achieve therapeutic efficacy . achieve control of hyperlipidemia as evidenced by LDL <70 . maintain control of HTN as evidenced by BP < 130/80  through collaboration with PharmD and provider.   Interventions: .  1:1 collaboration with [name], MD regarding development and update of comprehensive plan of care as evidenced by provider attestation and co-signature . Inter-disciplinary care team collaboration (see longitudinal plan of care) . Comprehensive medication review performed; medication list updated in electronic medical record  Pre -Diabetes / Obesity: . Improving; Patient reports losing about 20 lbs over the last few months thru dietary changes . Current glucose readings: does not check BG at home . Current meal patterns: eating more vegetables; has stopped dirnking regular sodas (previously was drinking 2 per day at least); increase in water intake; no fried foods . Current exercise: none  . Counseled on increasing physical activity to keep weight down and prevent diabetes - goal is 150 minutes per week; continue with dietary changes that have lead to weight loss.   Hypertension / CHF: . Varidable BP reading in office; .       Current regimen:  . Carvedilol 60m twice daily  . Triamterene/hctz 37.5/25mg daily . Amlodipine 116mdaily . Losartan 10069maily  . Furosemide 104m75mice a day . Denies/reports hypotensive/hypertensive symptoms; Does not check BP at home . Recommended continue current medications; recommended check BP 2 to 3  times per week and record  Hyperlipidemia / CAD: . Uncontrolled; with no current treatment: . Medications previously tried: none . Current dietary patterns: limiting serving sizes and sodas; increase in vegetable . Educated on physical activity as able; continue to avoid sodas and high fat foods;  Recommended trial of statin if next LDL is >70  BPH . Current regimen:  o Tamsulosin 0.4mg 34mly (prescribed, but pt does not have) Discussed reason for taking tamsulosin and patient feels that he should restart. Waiting on test results from biopsy from urologist; recommended he consult with urology about Rx for tamsulosin  Health Maintenance  . Interventions: o Recommend patient receive Td booster o Recommend patient receive Shingrix. Check price for Shingrix at local pharmacy and receive if cost is acceptable.  Medication management . [facility] . Interventions o Comprehensive medication review performed and medication list updated.  o Continue current medication management strategy . Recommendations: o Focus on medication adherence by filling and taking medications appropriately  o Take medications as prescribed o Report any questions or concerns to PharmD and/or provider(s)  Patient Goals/Self-Care Activities . Over the next 90 days, patient will:  take medications as prescribed, check blood pressure 2 to 3 times per week, document, and provide at future appointments, target a minimum of 150 minutes of moderate intensity exercise weekly, and engage in dietary modifications by continuing to avoid sodas, eat more vegetable and limit high fat foods  Follow Up Plan: Telephone follow up appointment with care management team member scheduled for:  3 to 4 months      Medication Assistance: None required.  Patient affirms current coverage meets needs.  Patient's preferred pharmacy is:  [id number] [address]: [phone] [address]-[address]: [phone]  Patient agrees to Care Plan and Follow-up.  Plan: Telephone follow up appointment with care management team member scheduled for:  3 months  Taz Vanness Cherre RobinsmD Clinical Pharmacist [facility]

## 2020-11-29 NOTE — Patient Instructions (Signed)
Visit Information  PATIENT GOALS: Goals Addressed            This Visit's Progress   . Chronic Care Management Pharmacy Care Plan   On track    CARE PLAN ENTRY (see longitudinal plan of care for additional care plan information)  Current Barriers:  . Chronic Disease Management support, education, and care coordination needs related to Hypertension, Hyperlipidemia/CAD, Heart Failure, Anxiety, GERD, BPH, Glaucoma, Anemia, Onychomycosis   Hypertension / Thoracic aneurysm / CHF BP Readings from Last 3 Encounters:  10/25/20 114/86  06/17/20 (!) 161/92  05/10/20 130/70   . Pharmacist Clinical Goal(s): o Over the next 90 days, patient will work with PharmD and providers to maintain BP goal <130/80 . Current regimen:  . Carvedilol 25mg  twice daily  . Triamterene/hctz 37.5/25mg  daily . Amlodipine 10mg  daily . Losartan 100mg  daily . Furosemide 40mg  twice daily . Interventions: o Discussed BP goal o Recommended check blood pressure at home 2 to 3 times per week and record . Patient self care activities - Over the next 90 days, patient will: o Check BP 2 to 3 times week, document, and provide at future appointments o Ensure daily salt intake < 2300 mg/day  Hyperlipidemia/Hx of CAD Lab Results  Component Value Date/Time   LDLCALC 96 04/22/2019 08:32 AM   . Pharmacist Clinical Goal(s): o Over the next 90 days, patient will work with PharmD and providers to achieve LDL goal < 70 . Current regimen:  o Aspirin 81mg  daily . Interventions: o Discussed LDL goal o Patient is to have labs checked soon. If LDL still >70, then consider initiation of statin . Patient self care activities - Over the next 90 days, patient will: o Have lipids checked in next 1-2 weeks (PCP has already ordered.   BPH . Pharmacist Clinical Goal(s) o Over the next 90 days, patient will work with PharmD and providers to reduce symptoms associated with BPH . Current regimen:  o Tamsulosin 0.4mg  daily  (prescribed, but pt does not have) . Interventions: o Discussed reason for taking tamsulosin and patient feels that he should restart. He is waiting on test results from biopsy from urologist; recommended he consult with urology about Rx for tamsulosin . Patient self care activities - Over the next 90 days, patient will: o Discuss with urologist if should restart tamsulosin  Health Maintenance  . Pharmacist Clinical Goal(s) o Over the next 90 days, patient will work with PharmD and providers to complete health maintenance screenings/vaccinations . Interventions: o Recommend patient receive Td booster o Recommend patient receive Shingrix . Patient self care activities - Over the next 90 days, patient will: o Receive Td booster  o Check price for Shingrix and receive is cost is acceptable.   Medication management . Pharmacist Clinical Goal(s): o Over the next 90 days, patient will work with PharmD and providers to maintain optimal medication adherence . [facility] . Interventions o Comprehensive medication review performed and medication list updated.  o Continue current medication management strategy . Patient self care activities - Over the next 90 days, patient will: o Focus on medication adherence by filling and taking medications appropriately  o Take medications as prescribed o Report any questions or concerns to PharmD and/or provider(s)       . Increase water intake   On track      The patient verbalized understanding of instructions, educational materials, and care plan provided today and declined offer to receive copy of patient instructions, educational materials,  and care plan.   Telephone follow up appointment with care management team member scheduled for: 3 to 4 months  [name], PharmD Clinical Pharmacist [place] [facility] [phone]

## 2020-12-07 ENCOUNTER — Other Ambulatory Visit (INDEPENDENT_AMBULATORY_CARE_PROVIDER_SITE_OTHER): Payer: Medicare Other

## 2020-12-07 ENCOUNTER — Other Ambulatory Visit: Payer: Self-pay

## 2020-12-07 DIAGNOSIS — R739 Hyperglycemia, unspecified: Secondary | ICD-10-CM | POA: Diagnosis not present

## 2020-12-07 DIAGNOSIS — E785 Hyperlipidemia, unspecified: Secondary | ICD-10-CM

## 2020-12-07 DIAGNOSIS — R972 Elevated prostate specific antigen [PSA]: Secondary | ICD-10-CM

## 2020-12-07 DIAGNOSIS — D509 Iron deficiency anemia, unspecified: Secondary | ICD-10-CM | POA: Diagnosis not present

## 2020-12-07 DIAGNOSIS — I10 Essential (primary) hypertension: Secondary | ICD-10-CM | POA: Diagnosis not present

## 2020-12-07 LAB — COMPREHENSIVE METABOLIC PANEL
ALT: 12 U/L (ref 0–53)
AST: 12 U/L (ref 0–37)
Albumin: 3.9 g/dL (ref 3.5–5.2)
Alkaline Phosphatase: 75 U/L (ref 39–117)
BUN: 12 mg/dL (ref 6–23)
CO2: 32 mEq/L (ref 19–32)
Calcium: 9.9 mg/dL (ref 8.4–10.5)
Chloride: 100 mEq/L (ref 96–112)
Creatinine, Ser: 1 mg/dL (ref 0.40–1.50)
GFR: 71.94 mL/min (ref >60.00)
Glucose, Bld: 130 mg/dL — ABNORMAL HIGH (ref 70–99)
Potassium: 3.7 mEq/L (ref 3.5–5.1)
Sodium: 140 mEq/L (ref 135–145)
Total Bilirubin: 1.4 mg/dL — ABNORMAL HIGH (ref 0.2–1.2)
Total Protein: 6.9 g/dL (ref 6.0–8.3)

## 2020-12-07 LAB — LIPID PANEL
Cholesterol: 160 mg/dL (ref 0–200)
HDL: 36.1 mg/dL — ABNORMAL LOW (ref >39.00)
LDL Cholesterol: 95 mg/dL (ref 0–99)
NonHDL: 124.15
Total CHOL/HDL Ratio: 4
Triglycerides: 145 mg/dL (ref 0.0–149.0)
VLDL: 29 mg/dL (ref 0.0–40.0)

## 2020-12-07 LAB — TSH: TSH: 1.79 u[IU]/mL (ref 0.35–4.50)

## 2020-12-07 LAB — CBC
HCT: 42.1 % (ref 39.0–52.0)
Hemoglobin: 13.9 g/dL (ref 13.0–17.0)
MCHC: 32.9 g/dL (ref 30.0–36.0)
MCV: 88.5 fl (ref 78.0–100.0)
Platelets: 284 10*3/uL (ref 150.0–400.0)
RBC: 4.75 Mil/uL (ref 4.22–5.81)
RDW: 13.2 % (ref 11.5–15.5)
WBC: 8.4 10*3/uL (ref 4.0–10.5)

## 2020-12-07 LAB — HEMOGLOBIN A1C: Hgb A1c MFr Bld: 5.4 % (ref 4.6–6.5)

## 2020-12-08 ENCOUNTER — Other Ambulatory Visit (HOSPITAL_COMMUNITY): Payer: Self-pay | Admitting: Urology

## 2020-12-08 ENCOUNTER — Other Ambulatory Visit: Payer: Self-pay | Admitting: Urology

## 2020-12-08 DIAGNOSIS — C61 Malignant neoplasm of prostate: Secondary | ICD-10-CM

## 2020-12-09 ENCOUNTER — Telehealth: Payer: Self-pay | Admitting: Family Medicine

## 2020-12-09 NOTE — Telephone Encounter (Signed)
Patient was calling back to go over lab results.

## 2020-12-09 NOTE — Telephone Encounter (Signed)
Pt is aware of lab result and set up for appointment in August

## 2020-12-20 ENCOUNTER — Other Ambulatory Visit: Payer: Self-pay | Admitting: Family Medicine

## 2020-12-24 ENCOUNTER — Encounter (HOSPITAL_COMMUNITY)
Admission: RE | Admit: 2020-12-24 | Discharge: 2020-12-24 | Disposition: A | Discharge status: Home / Self Care | Payer: Medicare Other | Source: Ambulatory Visit | Attending: Urology | Admitting: Urology

## 2020-12-24 ENCOUNTER — Other Ambulatory Visit: Payer: Self-pay

## 2020-12-24 DIAGNOSIS — C61 Malignant neoplasm of prostate: Secondary | ICD-10-CM | POA: Diagnosis not present

## 2020-12-24 DIAGNOSIS — K769 Liver disease, unspecified: Secondary | ICD-10-CM | POA: Diagnosis not present

## 2020-12-24 DIAGNOSIS — Z8546 Personal history of malignant neoplasm of prostate: Secondary | ICD-10-CM | POA: Diagnosis not present

## 2020-12-24 DIAGNOSIS — M160 Bilateral primary osteoarthritis of hip: Secondary | ICD-10-CM | POA: Diagnosis not present

## 2020-12-24 DIAGNOSIS — N2889 Other specified disorders of kidney and ureter: Secondary | ICD-10-CM | POA: Diagnosis not present

## 2020-12-24 DIAGNOSIS — N400 Enlarged prostate without lower urinary tract symptoms: Secondary | ICD-10-CM | POA: Diagnosis not present

## 2020-12-24 DIAGNOSIS — J329 Chronic sinusitis, unspecified: Secondary | ICD-10-CM | POA: Diagnosis not present

## 2020-12-24 DIAGNOSIS — J3489 Other specified disorders of nose and nasal sinuses: Secondary | ICD-10-CM | POA: Diagnosis not present

## 2020-12-24 MED ORDER — TECHNETIUM TC 99M MEDRONATE IV KIT
21.8000 | PACK | Freq: Once | INTRAVENOUS | Status: AC
  Administered 2020-12-24: 21.8 via INTRAVENOUS

## 2020-12-30 ENCOUNTER — Other Ambulatory Visit: Payer: Self-pay | Admitting: Cardiovascular Disease

## 2021-01-07 ENCOUNTER — Ambulatory Visit (INDEPENDENT_AMBULATORY_CARE_PROVIDER_SITE_OTHER): Payer: Medicare Other | Admitting: Podiatry

## 2021-01-07 ENCOUNTER — Other Ambulatory Visit: Payer: Self-pay

## 2021-01-07 ENCOUNTER — Encounter: Payer: Self-pay | Admitting: Podiatry

## 2021-01-07 DIAGNOSIS — L84 Corns and callosities: Secondary | ICD-10-CM

## 2021-01-07 DIAGNOSIS — M79674 Pain in right toe(s): Secondary | ICD-10-CM | POA: Diagnosis not present

## 2021-01-07 DIAGNOSIS — M2142 Flat foot [pes planus] (acquired), left foot: Secondary | ICD-10-CM

## 2021-01-07 DIAGNOSIS — M2141 Flat foot [pes planus] (acquired), right foot: Secondary | ICD-10-CM

## 2021-01-07 DIAGNOSIS — M2042 Other hammer toe(s) (acquired), left foot: Secondary | ICD-10-CM

## 2021-01-07 DIAGNOSIS — M79675 Pain in left toe(s): Secondary | ICD-10-CM

## 2021-01-07 DIAGNOSIS — B351 Tinea unguium: Secondary | ICD-10-CM | POA: Diagnosis not present

## 2021-01-07 DIAGNOSIS — M2041 Other hammer toe(s) (acquired), right foot: Secondary | ICD-10-CM

## 2021-01-07 NOTE — Progress Notes (Signed)
  Subjective:  Patient ID: David Parsons, male    DOB: 06-26-1941,  MRN: [id number]  David Parsons presents to clinic today for callus(es) submet head 5 b/l and painful thick toenails that are difficult to trim. Painful toenails interfere with ambulation. Aggravating factors include wearing enclosed shoe gear. Pain is relieved with periodic professional debridement. Painful calluses are aggravated when weightbearing with and without shoegear. Pain is relieved with periodic professional debridement.  Allergies  Allergen Reactions  . Latex Hives    Latex gloves     Review of Systems: Negative except as noted in the HPI. Objective:   Constitutional TIVON LEMOINE is a pleasant 80 y.o. African American male, morbidly obese in NAD. AAO x 3.   Vascular Capillary refill time to digits immediate b/l. Palpable pedal pulses b/l LE. Pedal hair absent. Lower extremity skin temperature gradient within normal limits. No pain with calf compression b/l. No edema noted b/l lower extremities. No cyanosis or clubbing noted.  Neurologic Normal speech. Oriented to person, place, and time. Protective sensation intact 5/5 intact bilaterally with 10g monofilament b/l. Vibratory sensation intact b/l. Proprioception intact bilaterally.  Dermatologic Pedal skin with normal turgor, texture and tone bilaterally. No open wounds bilaterally. No interdigital macerations bilaterally. Toenails 1-5 b/l elongated, discolored, dystrophic, thickened, crumbly with subungual debris and tenderness to dorsal palpation. Hyperkeratotic lesion(s) submet head 5 left foot and submet head 5 right foot.  No erythema, no edema, no drainage, no fluctuance.  Orthopedic: Normal muscle strength 5/5 to all lower extremity muscle groups bilaterally. No pain crepitus or joint limitation noted with ROM b/l. Hammertoe(s) noted to the b/l lower extremities. Pes planus deformity noted b/l.    Radiographs: None Assessment:   1. Pain due to  onychomycosis of toenails of both feet   2. Callus   3. Pes planus of both feet   4. Acquired hammertoes of both feet    Plan:  Patient was evaluated and treated and all questions answered.  Onychomycosis with pain -Nails palliatively debridement as below -Educated on self-care  Procedure: Nail Debridement Rationale: Pain Type of Debridement: manual, sharp debridement. Instrumentation: Nail nipper, rotary burr. Number of Nails: 10 -Examined patient. -Medicare ABN signed for this year. Patient refuses services of paring of calluses submet head 5 b/l  today. Copy given to patient on today's visit and copy placed in patient's chart. -Patient to continue soft, supportive shoe gear daily. -Toenails 1-5 b/l were debrided in length and girth with sterile nail nippers and dremel without iatrogenic bleeding.  -Patient to report any pedal injuries to medical professional immediately. -Patient/POA to call should there be question/concern in the interim.  Return in about 3 months (around 04/09/2021).  Marzetta Board, DPM

## 2021-01-10 ENCOUNTER — Other Ambulatory Visit (HOSPITAL_COMMUNITY): Payer: Self-pay | Admitting: Urology

## 2021-01-10 ENCOUNTER — Other Ambulatory Visit: Payer: Self-pay

## 2021-01-10 ENCOUNTER — Ambulatory Visit (HOSPITAL_COMMUNITY)
Admission: RE | Admit: 2021-01-10 | Discharge: 2021-01-10 | Disposition: A | Discharge status: Home / Self Care | Payer: Medicare Other | Source: Ambulatory Visit | Attending: Urology | Admitting: Urology

## 2021-01-10 DIAGNOSIS — C61 Malignant neoplasm of prostate: Secondary | ICD-10-CM | POA: Diagnosis not present

## 2021-01-18 ENCOUNTER — Other Ambulatory Visit: Payer: Self-pay | Admitting: Family Medicine

## 2021-02-03 ENCOUNTER — Other Ambulatory Visit: Payer: Self-pay | Admitting: Urology

## 2021-02-03 ENCOUNTER — Ambulatory Visit
Admission: RE | Admit: 2021-02-03 | Discharge: 2021-02-03 | Disposition: A | Discharge status: Home / Self Care | Payer: Medicare Other | Source: Ambulatory Visit | Attending: Radiation Oncology | Admitting: Radiation Oncology

## 2021-02-03 ENCOUNTER — Encounter: Payer: Self-pay | Admitting: Radiation Oncology

## 2021-02-03 ENCOUNTER — Other Ambulatory Visit: Payer: Self-pay

## 2021-02-03 DIAGNOSIS — Z800 Family history of malignant neoplasm of digestive organs: Secondary | ICD-10-CM | POA: Insufficient documentation

## 2021-02-03 DIAGNOSIS — R011 Cardiac murmur, unspecified: Secondary | ICD-10-CM | POA: Diagnosis not present

## 2021-02-03 DIAGNOSIS — D509 Iron deficiency anemia, unspecified: Secondary | ICD-10-CM | POA: Diagnosis not present

## 2021-02-03 DIAGNOSIS — Z803 Family history of malignant neoplasm of breast: Secondary | ICD-10-CM | POA: Diagnosis not present

## 2021-02-03 DIAGNOSIS — I2510 Atherosclerotic heart disease of native coronary artery without angina pectoris: Secondary | ICD-10-CM | POA: Diagnosis not present

## 2021-02-03 DIAGNOSIS — K219 Gastro-esophageal reflux disease without esophagitis: Secondary | ICD-10-CM | POA: Insufficient documentation

## 2021-02-03 DIAGNOSIS — G4730 Sleep apnea, unspecified: Secondary | ICD-10-CM | POA: Diagnosis not present

## 2021-02-03 DIAGNOSIS — Z79899 Other long term (current) drug therapy: Secondary | ICD-10-CM | POA: Diagnosis not present

## 2021-02-03 DIAGNOSIS — I10 Essential (primary) hypertension: Secondary | ICD-10-CM | POA: Insufficient documentation

## 2021-02-03 DIAGNOSIS — E785 Hyperlipidemia, unspecified: Secondary | ICD-10-CM | POA: Diagnosis not present

## 2021-02-03 DIAGNOSIS — I429 Cardiomyopathy, unspecified: Secondary | ICD-10-CM | POA: Diagnosis not present

## 2021-02-03 DIAGNOSIS — E119 Type 2 diabetes mellitus without complications: Secondary | ICD-10-CM | POA: Diagnosis not present

## 2021-02-03 DIAGNOSIS — C61 Malignant neoplasm of prostate: Secondary | ICD-10-CM | POA: Insufficient documentation

## 2021-02-03 DIAGNOSIS — R972 Elevated prostate specific antigen [PSA]: Secondary | ICD-10-CM | POA: Diagnosis not present

## 2021-02-03 DIAGNOSIS — Z7982 Long term (current) use of aspirin: Secondary | ICD-10-CM | POA: Diagnosis not present

## 2021-02-03 DIAGNOSIS — Z8546 Personal history of malignant neoplasm of prostate: Secondary | ICD-10-CM | POA: Insufficient documentation

## 2021-02-03 HISTORY — DX: Malignant neoplasm of prostate: C61

## 2021-02-03 MED ORDER — TAMSULOSIN HCL 0.4 MG PO CAPS: 0.4000 mg | ORAL_CAPSULE | Freq: Every day | ORAL | 3 refills | Status: AC

## 2021-02-03 NOTE — Progress Notes (Signed)
Radiation Oncology         [phone]) [phone] ________________________________  Initial Outpatient Consultation - Conducted via Telephone due to current COVID-19 concerns for limiting patient exposure  Name: LOGON UTTECH MRN: [id number]  Date: 02/03/2021  DOB: Sep 01, 1940  OI:[id number], Bonnita Levan, MD  Franchot Gallo, MD   REFERRING PHYSICIAN: Franchot Gallo, MD  DIAGNOSIS: 80 y.o. gentleman with Stage T1c adenocarcinoma of the prostate with Gleason score of 4+3, and PSA of 13.40.    ICD-10-CM   1. Malignant neoplasm of prostate ([place])  C61       HISTORY OF PRESENT ILLNESS: NAZIM KADLEC is a 80 y.o. male with a diagnosis of prostate cancer. He was previously followed by Dr. Karsten Ro at [facility] for an elevated PSA since at least 2012 but had not had any prior prostate biopsy.  His PSAs have fluctuated between 5 and 6 until January 2012 when the PSA was noted further elevated at 7.32.  However, on repeat PSA approximately 3 months later this had returned to his baseline and has fluctuated since that time.  Most recently, he was noted to have an elevated PSA of 46.25  on 05/03/2020.  After a course of antibiotics, this did decrease to 23.37, 1-week later on 05/10/2020 and continued to gradually decrease at 15.7 on 06/25/2020 and most recently, 13.40 on 09/29/2020, following with Dr. Diona Fanti. Digital rectal examination has remained without concerning findings aside from enlarged prostate.  The patient proceeded to transrectal ultrasound with 12 biopsies of the prostate on 11/26/2020.  The prostate volume measured 212 cc.  Out of 12 core biopsies, 7 were positive.  The maximum Gleason score was 4+3, and this was seen in the right mid.  Additionally, Gleason 3+4 was noted in the right base, right apex and right apex lateral as well as Gleason 3+3 in the left mid lateral, left apex and left apex lateral.  He had a CT A/P and bone scan on 12/24/2020 for disease staging.  The CT scan was without any  evidence of visceral or osseous metastatic disease but the bone scan did show a questionable lesion in the right distal femoral diaphysis.  This was further evaluated with plain radiographs of the right femur which did not show any sclerotic lesions concerning for prostate cancer metastasis.  The patient reviewed the biopsy and imaging results with his urologist and he has kindly been referred today for discussion of potential radiation treatment options.  PREVIOUS RADIATION THERAPY: No  PAST MEDICAL HISTORY:  Past Medical History:  Diagnosis Date   Aortic insufficiency     02/16/09 Chest CT:  The aortic root has a diameter measuring 4.6 cm, image 64 of the coronal series.  The ascending    thoracic aorta has a diameter of 3.8 cm, image 65 of the coronal series.  At the level of the aortic arch    the thoracic aorta has a maximum diameter of 3.2 cm.  The descending thoracic aorta has a maximum           diameter of 3.2 cm.  There is no evidence for aortic disse         Aortic root enlargement (HCC)    The aortic root has a diameter measuring 4.6 cm, image 64 of the coronal series.  The ascending  thoracic aorta has a diameter of 3.8 cm, image 65 of the coronal series.  At the level of the aortic arch   the thoracic aorta has a maximum diameter of  3.2 cm.  The descending thoracic aorta has a maximum   diameter of 3.2 cm.  There is no evidence for aortic dissection.        CAD (coronary artery disease)    Cardiomyopathy     01/2009 - Cath - nonobs. dzs.  EF 35%.   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   Biological Bentall Aortic Root Replacement using 25 mm Edwards Magna Ease pericardial tissue valve and 28 mm Vascutek Gelweave Valsalva conduit with reimplantation of left main and right coronary artery and CABG x1 using SVG to RCA   Sleep apnea    occasionally wears CPAP at night      PAST SURGICAL HISTORY: Past Surgical History:  Procedure Laterality Date   ADRENALECTOMY     BENTALL PROCEDURE  12/28/2011   Procedure: BENTALL PROCEDURE;  Surgeon: Rexene Alberts, MD;  Location: [place];  Service: Open Heart Surgery;  Laterality: N/A;   CARDIAC CATHETERIZATION     2013   CARDIAC VALVE REPLACEMENT  12/28/2011   Bentall with tissue valve   CHOLECYSTECTOMY     COLONOSCOPY  09/25/2011   COLONOSCOPY W/ BIOPSIES AND POLYPECTOMY  11/04/2008   colon polyps, diverticulosis    CORONARY ARTERY BYPASS GRAFT  12/28/2011   Procedure: CORONARY ARTERY BYPASS GRAFTING (CABG);  Surgeon: Rexene Alberts, MD;  Location: [place];  Service: Open Heart Surgery;  Laterality: N/A;  cabg x 1   LEFT AND RIGHT HEART CATHETERIZATION WITH CORONARY ANGIOGRAM N/A 11/22/2011   Procedure: LEFT AND RIGHT HEART CATHETERIZATION WITH CORONARY ANGIOGRAM;  Surgeon: Burnell Blanks, MD;  Location: [facility] CATH LAB;  Service: Cardiovascular;  Laterality: N/A;   MULTIPLE TOOTH EXTRACTIONS     PROSTATE BIOPSY Bilateral    TEE WITHOUT CARDIOVERSION  11/22/2011   Procedure: TRANSESOPHAGEAL ECHOCARDIOGRAM (TEE);  Surgeon: Larey Dresser, MD;  Location: [place];  Service: Cardiovascular;  Laterality: N/A;   UPPER GASTROINTESTINAL ENDOSCOPY  11/04/2008   w/bx, H pylori gastritis   UPPER GASTROINTESTINAL ENDOSCOPY  09/25/2011    FAMILY HISTORY:  Family History  Problem Relation Age of Onset   Breast cancer Sister    Diabetes Sister    Colon cancer Brother 88       at least 72   Diabetes Brother    Cancer  Brother        colon, prostate   Prostate cancer Brother    Diabetes Brother    Diabetes Sister    Cancer Sister        breast, colon, melanoma   Heart disease Sister    Hypertension Mother    Arthritis Mother    Kidney failure Sister    Colon cancer Sister    Breast cancer Sister    Kidney disease Sister        s/p transplant   Hypertension Sister    Diabetes Sister    Stroke Father  Hypertension Father     SOCIAL HISTORY:  Social History   Socioeconomic History   Marital status: Married    Spouse name: [name]   Number of children: 0   Years of education: Not on file   Highest education level: Not on file  Occupational History    Employer: [employer]    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  He  lives a very sedentary lifestyle.  He is a nonsmoker.  He denies alcohol consumption.    Social Determinants of Radio broadcast assistant Strain: Not on file  Food Insecurity: No Food Insecurity   Worried About Charity fundraiser in the Last Year: Never true   Ran Out of Food in the Last Year: Never true  Transportation Needs: Not on file  Physical Activity: Inactive   Days of Exercise per Week: 0 days   Minutes of Exercise per Session: 0 min  Stress: Not on file  Social Connections: Socially Integrated   Frequency of Communication with Friends and Family: Three times a week   Frequency of Social Gatherings with Friends and Family: Three times a week   Attends Religious Services: More than 4 times per year   Active Member of  Clubs or Organizations: Yes   Attends Music therapist: More than 4 times per year   Marital Status: Married  Human resources officer Violence: Not on file    ALLERGIES: Latex  MEDICATIONS:  Current Outpatient Medications  Medication Sig Dispense Refill   amLODipine (NORVASC) 10 MG tablet TAKE 1 TABLET BY MOUTH EVERY DAY 90 tablet 1   aspirin EC 81 MG tablet Take 81 mg by mouth daily.     carvedilol (COREG) 25 MG tablet Take 1 tablet (25 mg total) by mouth 2 (two) times daily with a meal. 180 tablet 0   diazepam (VALIUM) 5 MG tablet TAKE 1/2-2 TABLETS BY MOUTH DAILY AS NEEDED FOR ANXIETY. 10 tablet 0   ferrous sulfate 325 (65 FE) MG tablet Take 1 tablet (325 mg total) by mouth 2 (two) times daily with a meal. 180 tablet 0   furosemide (LASIX) 40 MG tablet Take 1 tablet (40 mg total) by mouth 2 (two) times daily. 60 tablet 12   KLOR-CON M20 20 MEQ tablet TAKE 3 TABLETS (60 MEQ TOTAL) BY MOUTH 3 (THREE) TIMES DAILY. 270 tablet 1   losartan (COZAAR) 100 MG tablet TAKE 1 TABLET BY MOUTH EVERY DAY 90 tablet 3   Multiple Vitamin (MULITIVITAMIN WITH MINERALS) TABS Take 1 tablet by mouth daily.     omeprazole (PRILOSEC) 40 MG capsule TAKE 1 CAPSULE BY MOUTH EVERY DAY 90 capsule 3   timolol (TIMOPTIC) 0.5 % ophthalmic solution INSTILL 1 DROP INTO BOTH EYES TWICE A DAY     triamterene-hydrochlorothiazide (MAXZIDE-25) 37.5-25 MG tablet TAKE 1 TABLET BY MOUTH EVERY DAY 90 tablet 0   ammonium lactate (AMLACTIN) 12 % lotion Apply 1 application topically 2 (two) times  daily. (Patient not taking: Reported on 02/03/2021) 400 g 4   clobetasol ointment (TEMOVATE) 0.05 % apply to affected area twice a day UPON ITCHING AND IRRITATION(NOT TO FACE OR GROIN) (Patient not taking: Reported on 02/03/2021)  0   hydrocortisone 2.5 % cream Apply 1 application topically 2 (two) times daily.  (Patient not taking: Reported on 02/03/2021)  0   levofloxacin (LEVAQUIN) 750 MG tablet  (Patient not taking: Reported on  02/03/2021)     nystatin cream (MYCOSTATIN) Apply 1 application topically 2 (two) times daily. (Patient not taking: Reported on 02/03/2021) 30 g 1   tamsulosin (FLOMAX) 0.4 MG CAPS capsule Take 1 capsule (0.4 mg total) by mouth daily. 30 capsule 3   No current facility-administered medications for this encounter.    REVIEW OF SYSTEMS:  On review of systems, the patient reports that he is doing well overall. He denies any chest pain, shortness of breath, cough, fevers, chills, night sweats, unintended weight changes. He denies any bowel disturbances, and denies abdominal pain, nausea or vomiting. He denies any new musculoskeletal or joint aches or pains. His IPSS was 7, indicating mild urinary symptoms with weak flow of stream, frequency and nocturia x2 but reports that this is significantly improved on Flomax daily. His SHIM was low, indicating he has erectile dysfunction. A complete review of systems is obtained and is otherwise negative.    PHYSICAL EXAM:  Wt Readings from Last 3 Encounters:  10/25/20 (!) 310 lb (140.6 kg)  05/10/20 (!) 317 lb (143.8 kg)  05/03/20 (!) 312 lb 6.4 oz (141.7 kg)   Temp Readings from Last 3 Encounters:  06/17/20 98.4 F (36.9 C) (Oral)  05/03/20 97.7 F (36.5 C) (Oral)  05/01/20 99 F (37.2 C)   BP Readings from Last 3 Encounters:  10/25/20 114/86  06/17/20 (!) 161/92  05/10/20 130/70   Pulse Readings from Last 3 Encounters:  10/25/20 66  06/17/20 86  05/10/20 72   Pain Assessment Pain Score: 0-No pain Unable to assess due to telephone consult visit format.   KPS = 90  100 - Normal; no complaints; no evidence of disease. 90   - Able to carry on normal activity; minor signs or symptoms of disease. 80   - Normal activity with effort; some signs or symptoms of disease. 22   - Cares for self; unable to carry on normal activity or to do active work. 60   - Requires occasional assistance, but is able to care for most of his personal needs. 50   -  Requires considerable assistance and frequent medical care. 55   - Disabled; requires special care and assistance. 56   - Severely disabled; hospital admission is indicated although death not imminent. 25   - Very sick; hospital admission necessary; active supportive treatment necessary. 10   - Moribund; fatal processes progressing rapidly. 0     - Dead  Karnofsky DA, Abelmann [place], Craver LS and [facility] [phone]) The use of the nitrogen mustards in the palliative treatment of carcinoma: with particular reference to bronchogenic carcinoma Cancer 1 634-56  LABORATORY DATA:  Lab Results  Component Value Date   WBC 8.4 12/07/2020   HGB 13.9 12/07/2020   HCT 42.1 12/07/2020   MCV 88.5 12/07/2020   PLT 284.0 12/07/2020   Lab Results  Component Value Date   NA 140 12/07/2020   K 3.7 12/07/2020   CL 100 12/07/2020   CO2 32 12/07/2020   Lab Results  Component Value Date  ALT 12 12/07/2020   AST 12 12/07/2020   ALKPHOS 75 12/07/2020   BILITOT 1.4 (H) 12/07/2020     RADIOGRAPHY: DG FEMUR, MIN 2 VIEWS RIGHT  Result Date: 01/12/2021 CLINICAL DATA:  Prostate cancer EXAM: RIGHT FEMUR 2 VIEWS COMPARISON:  None. Findings are correlated with bone scan 12/24/2020 FINDINGS: Two view radiograph of the right femur demonstrates normal alignment. No fracture or dislocation. There is a poorly circumscribed sclerotic lesion within the medullary canal of the distal right femoral diaphysis demonstrating arc like calcification corresponding to the sclerotic lesion on prior bone scan and compatible with a intramedullary enchondroma. Soft tissues are unremarkable. 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 No sclerotic metastasis is identified within the right femur. Electronically Signed   By: Fidela Salisbury MD   On: 01/12/2021 06:48      IMPRESSION/PLAN: This visit was conducted via Telephone to spare the patient unnecessary potential  exposure in the healthcare setting during the current COVID-19 pandemic. 1. 80 y.o. gentleman with Stage T1c adenocarcinoma of the prostate with Gleason Score of 4+3, and PSA of 13.4.  We discussed the patient's workup and outlined the nature of prostate cancer in this setting. The patient's T stage, Gleason's score, and PSA put him into the unfavorable intermediate risk group. Accordingly, he is eligible for a variety of potential treatment options including brachytherapy, 5.5-8 weeks of external radiation, or prostatectomy. We discussed the available radiation techniques, and focused on the details and logistics and delivery. The patient is not an ideal candidate for brachytherapy with a prostate volume of 212 gm. We discussed and outlined the risks, benefits, short and long-term effects associated with radiotherapy and compared and contrasted these with prostatectomy.  We did share our concern that he may possibly have more pronounced LUTS in the setting of hypofractionated radiotherapy given the extremely large size of his prostate.  We discussed the role of SpaceOAR in reducing the rectal toxicity associated with radiotherapy.  He was encouraged to ask questions that were answered to his stated satisfaction.  At the end of the conversation the patient is interested in moving forward with 5.5- 8 weeks of external beam therapy.  He is leaning towards a 5-1/2-week course but giving consideration to the 8-week course to reduce LUTS associated with the radiation treatments.  We will share our discussion with Dr. Diona Fanti and make arrangements for fiducial marker s and SpaceOAR gel, first available, prior to simulation, to reduce rectal toxicity from radiotherapy. The patient appears to have a good understanding of his disease and our treatment recommendations which are of curative intent and is in agreement with the stated plan.  Therefore, we will move forward with treatment planning accordingly, in  anticipation of beginning IMRT in the near future.  Given current concerns for patient exposure during the COVID-19 pandemic, this encounter was conducted via telephone. The patient was notified in advance and was offered a MyChart meeting to allow for face to face communication but unfortunately reported that he did not have the appropriate resources/technology to support such a visit and instead preferred to proceed with telephone consult. The patient has given verbal consent for this type of encounter. The attendants for this meeting include Tyler Pita MD, Ashlyn Bruning PA-C, and patient REGGINALD PASK. During the encounter, Tyler Pita MD, and Andi Devon, were located at [facility].  Patient REYMOND MAYNEZ was located at home.   We personally  spent 75 minutes in this encounter including chart review, reviewing radiological studies, meeting face-to-face with the patient, entering orders and completing documentation.    Nicholos Johns, PA-C    Tyler Pita, MD  [facility] Direct Dial: [phone]  Fax: [phone] [email or web address]  Skype  [email or web address]   This document serves as a record of services personally performed by Tyler Pita, MD and Freeman Caldron, PA-C. It was created on their behalf by Wilburn Mylar, a trained medical scribe. The creation of this record is based on the scribe's personal observations and the provider's statements to them. This document has been checked and approved by the attending provider.

## 2021-02-03 NOTE — Progress Notes (Signed)
AUA 7 distress 0. Patient does complain of some obstructive symptoms when he was seen for this in the urgent care. He was given an RX for Flomax and has run out and would like to have a new prescription if possible. Patient denies any complaints of pain at this time. Gleason is 3 plus 3 biopsy positive for adenocarcinoma.

## 2021-02-04 ENCOUNTER — Ambulatory Visit: Payer: Medicare Other | Admitting: Radiation Oncology

## 2021-02-04 ENCOUNTER — Ambulatory Visit: Payer: Medicare Other

## 2021-02-07 ENCOUNTER — Telehealth: Payer: Self-pay | Admitting: *Deleted

## 2021-02-07 NOTE — Telephone Encounter (Signed)
CALLED PATIENT TO INFORM OF FID. MARKERS AND SPACE OAR PLACEMENT ON 02-22-21 @ [facility] AND HIS SIM ON 03-04-21- ARRIVAL TIME- 8:15 AM @ [place], SPOKE WITH PATIENT AND HE IS AWARE OF THESE APPTS.

## 2021-02-09 ENCOUNTER — Other Ambulatory Visit: Payer: Self-pay | Admitting: Family Medicine

## 2021-02-13 ENCOUNTER — Other Ambulatory Visit: Payer: Self-pay | Admitting: Family Medicine

## 2021-02-15 ENCOUNTER — Other Ambulatory Visit: Payer: Self-pay | Admitting: Family Medicine

## 2021-02-22 ENCOUNTER — Ambulatory Visit: Payer: Medicare Other | Admitting: Family Medicine

## 2021-02-22 DIAGNOSIS — C61 Malignant neoplasm of prostate: Secondary | ICD-10-CM | POA: Diagnosis not present

## 2021-02-28 ENCOUNTER — Other Ambulatory Visit: Payer: Self-pay | Admitting: Family Medicine

## 2021-02-28 ENCOUNTER — Ambulatory Visit (INDEPENDENT_AMBULATORY_CARE_PROVIDER_SITE_OTHER): Payer: Medicare Other | Admitting: Pharmacist

## 2021-02-28 DIAGNOSIS — I2510 Atherosclerotic heart disease of native coronary artery without angina pectoris: Secondary | ICD-10-CM | POA: Diagnosis not present

## 2021-02-28 DIAGNOSIS — I10 Essential (primary) hypertension: Secondary | ICD-10-CM | POA: Diagnosis not present

## 2021-02-28 DIAGNOSIS — E785 Hyperlipidemia, unspecified: Secondary | ICD-10-CM | POA: Diagnosis not present

## 2021-02-28 DIAGNOSIS — R739 Hyperglycemia, unspecified: Secondary | ICD-10-CM

## 2021-03-03 ENCOUNTER — Telehealth: Payer: Self-pay | Admitting: *Deleted

## 2021-03-03 NOTE — Patient Instructions (Signed)
Visit Information  PATIENT GOALS:  Goals Addressed             This Visit's Progress    Chronic Care Management Pharmacy Care Plan       CARE PLAN ENTRY (see longitudinal plan of care for additional care plan information)  Current Barriers:  Chronic Disease Management support, education, and care coordination needs related to Hypertension, Hyperlipidemia/CAD, Heart Failure, Anxiety, GERD, BPH, Glaucoma, Anemia, Onychomycosis   Hypertension / Thoracic aneurysm / CHF BP Readings from Last 3 Encounters:  10/25/20 114/86  06/17/20 (!) 161/92  05/10/20 130/70  Pharmacist Clinical Goal(s): Over the next 90 days, patient will work with PharmD and providers to maintain BP goal <130/80 Current regimen:  Carvedilol 25mg  twice daily  Triamterene/hctz 37.5/25mg  daily Amlodipine 10mg  daily Losartan 100mg  daily Furosemide 40mg  twice daily Interventions: Discussed blood pressure goal Recommended check blood pressure at home 2 to 3 times per week and record Patient self care activities - Over the next 90 days, patient will: Check blood pressure 2 to 3 times week, document, and provide at future appointments Ensure daily salt intake < 2300 mg/day Continue current regimen for blood pressure control  Hyperlipidemia/Hx of CAD Lab Results  Component Value Date/Time   [facility] 95 12/07/2020 09:28 AM  Pharmacist Clinical Goal(s): Over the next 90 days, patient will work with PharmD and providers to achieve LDL goal < 70 Current regimen:  Aspirin 81mg  daily Interventions: Discussed LDL goal Considering statin therapy - will hold off until after upcoming radiation therpay Patient self care activities - Over the next 90 days, patient will: Consider adding statin after completed radiation therapy  BPH / Prostate cancer Pharmacist Clinical Goal(s) Over the next 90 days, patient will work with PharmD and providers to reduce symptoms associated with BPH Current regimen:  Tamsulosin 0.4mg   daily  Patient self care activities - Over the next 90 days, patient will: Continue current therapy Continue to follow up with Dr Tammi Klippel and urologist  GERD / acid reflux  Pharmacist Clinical Goal(s) Over the next 90 days, patient will work with PharmD and providers to prevent acid reflux symptoms and assess if omeprazole is still needed.  Current therapy:  Omeprazole 40mg  daily  Interventions:  Discussed benefits versus risks of PPI therpay.  Recommended trial of taking omeprazole 40mg  every other day. If no reflux symptoms after 1 month, then consider lowering dose to 20mg  every other day for 1 months, then stop if able.  Patient self care activities - Over the next 90 days, patient will: Decreased omeprazole to take 40mg  every OTHER day for the next month Monitor for symptoms of acid reflux and report to office if experience:  A burning sensation in your chest (heartburn), usually after eating, which might be worse at night. Chest pain. Difficulty swallowing. Regurgitation of food or sour liquid. Sensation of a lump in your throat.  Health Maintenance  Pharmacist Clinical Goal(s) Over the next 90 days, patient will work with PharmD and providers to complete health maintenance screenings/vaccinations Interventions: Recommend patient receive Td booster Recommend patient receive Shingrix Patient self care activities - Over the next 90 days, patient will: Receive Td booster (will get after completed radiation therapy)  Check price for Shingrix and receive if cost is acceptable. (will check after completed radiation therapy)  Recommend starting walking program for exercise after completing radiation. Exercise can help keep blood glucose, blood pressure and cholesterol down. Goal it to get at least 150 minutes of physical activity per week.  Medication management Pharmacist Clinical Goal(s): Over the next 90 days, patient will work with PharmD and providers to maintain optimal  medication adherence Current pharmacy: [facility] Interventions Comprehensive medication review performed and medication list updated.  Continue current medication management strategy Patient self care activities - Over the next 90 days, patient will: Focus on medication adherence by filling and taking medications appropriately  Take medications as prescribed Report any questions or concerns to PharmD and/or provider(s)           Patient verbalizes understanding of instructions provided today and agrees to view in [place].   Telephone follow up appointment with care management team member scheduled for: 1 month  [name], PharmD Clinical Pharmacist [place] [phone]

## 2021-03-03 NOTE — Chronic Care Management (AMB) (Signed)
Chronic Care Management Pharmacy Note  03/03/2021 Name:  David Parsons MRN:  [id number] DOB:  [birth year]  Summary: Patient starting radiation next week for prostate cancer LDL not at goal  Long term PPI therapy without recent reflux symptoms  Recommendations/Changes made from today's visit: Consider starting statin after completes radiation therapy Lower dose of omeprazole form 33m daily to 432mevery other day. Monitor for reflux symptoms.   Plan: Pharmacist phone visit to check in - 1 month.  Subjective: David Parsons an [age].o. year old male who is a primary patient of BlMosie LukesMD.  The CCM team was consulted for assistance with disease management and care coordination needs.    Engaged with patient by telephone for follow up visit in response to provider referral for pharmacy case management and/or care coordination services.   Consent to Services:  The patient was given information about Chronic Care Management services, agreed to services, and gave verbal consent prior to initiation of services.  Please see initial visit note for detailed documentation.   Patient Care Team: BlMosie LukesMD as PCP - General (Family Medicine) McBurnell BlanksMD as Consulting Physician (Cardiology) GeGatha MayerMD as Consulting Physician (Gastroenterology) LuDruscilla BrownieMD as Consulting Physician (Dermatology) DiCalvert CantorMD as Consulting Physician (Ophthalmology) TaCarolan ClinesMD (Inactive) as Consulting Physician (Urology) MaGardiner BarefootDPM as Consulting Physician (Podiatry) EcCherre RobinsPharmD (Pharmacist)  Recent office visits: 11/23/2020 - PCP (Dr BlCharlett BlakeVideo visit to f/u chronic medical concerns. Labs ordered - A1c, CBC, lipids, CMP and TSH for future. No medication changes.  Recent consult visits: 02/03/2021 - Rad Onc (Dr MaTammi KlippelPhone Visit - Consult for possible radiation.  Stage T1c adenocarcinoma of the prostate with Gleason  score of 4+3, and PSA of 13.40. At the end of the conversation the patient is interested in moving forward with 5.5- 8 weeks of external beam therapy.  He is leaning towards a 5-1/2-week course but giving consideration to the 8-week course to reduce LUTS associated with the radiation treatments.   11/03/2020 - Ortho (Dr JaIvin BootyFollow-up right long trigger finger. Symptoms have resolved after injection. No medication changes  10/25/2020 - Cardio (Dr McAngelena Formfor cardiac follow up. No medication changes   09/24/2020 - Podiatry (Dr GaElisha Ponderpresents to clinic today for painful callus(es) b/l and painful thick toenails that are difficult to trim. No medication changes Hospital visits: None in previous 6 months  Objective:  Lab Results  Component Value Date   CREATININE 1.00 12/07/2020   CREATININE 0.99 10/28/2019   CREATININE 0.88 04/22/2019    Lab Results  Component Value Date   HGBA1C 5.4 12/07/2020   Last diabetic Eye exam: No results found for: HMDIABEYEEXA  Last diabetic Foot exam: No results found for: HMDIABFOOTEX      Component Value Date/Time   CHOL 160 12/07/2020 0928   TRIG 145.0 12/07/2020 0928   HDL 36.10 (L) 12/07/2020 0928   CHOLHDL 4 12/07/2020 0928   VLDL 29.0 12/07/2020 0928   LDLCALC 95 12/07/2020 0928    Hepatic Function Latest Ref Rng & Units 12/07/2020 10/28/2019 04/22/2019  Total Protein 6.0 - 8.3 g/dL 6.9 6.9 6.8  Albumin 3.5 - 5.2 g/dL 3.9 3.9 4.0  AST 0 - 37 U/L 12 16 14   ALT 0 - 53 U/L 12 14 14   Alk Phosphatase 39 - 117 U/L 75 72 71  Total Bilirubin 0.2 - 1.2 mg/dL 1.4(H) 1.0 1.1  Bilirubin, Direct 0.0 -  0.3 mg/dL - - -    Lab Results  Component Value Date/Time   TSH 1.79 12/07/2020 09:28 AM   TSH 2.36 04/22/2019 08:32 AM    CBC Latest Ref Rng & Units 12/07/2020 05/10/2020 05/03/2020  WBC 4.0 - 10.5 K/uL 8.4 10.0 16.5(H)  Hemoglobin 13.0 - 17.0 g/dL 13.9 13.1(L) 13.1(L)  Hematocrit 39.0 - 52.0 % 42.1 40.3 39.3  Platelets 150.0 - 400.0 K/uL  284.0 454(H) 255    No results found for: VD25OH  Clinical ASCVD: Yes  The 10-year ASCVD risk score Mikey Bussing DC Jr., et al., 2013) is: 36.1%   Values used to calculate the score:     Age: 33 years     Sex: Male     Is Non-Hispanic African American: Yes     Diabetic: Yes     Tobacco smoker: No     Systolic Blood Pressure: 098 mmHg     Is BP treated: Yes     HDL Cholesterol: 36.1 mg/dL     Total Cholesterol: 160 mg/dL     Social History   Tobacco Use  Smoking Status Never  Smokeless Tobacco Never   BP Readings from Last 3 Encounters:  10/25/20 114/86  06/17/20 (!) 161/92  05/10/20 130/70   Pulse Readings from Last 3 Encounters:  10/25/20 66  06/17/20 86  05/10/20 72   Wt Readings from Last 3 Encounters:  10/25/20 (!) 310 lb (140.6 kg)  05/10/20 (!) 317 lb (143.8 kg)  05/03/20 (!) 312 lb 6.4 oz (141.7 kg)    Assessment: Review of patient past medical history, allergies, medications, health status, including review of consultants reports, laboratory and other test data, was performed as part of comprehensive evaluation and provision of chronic care management services.   SDOH:  (Social Determinants of Health) assessments and interventions performed:  SDOH Interventions    Flowsheet Row Most Recent Value  SDOH Interventions   Financial Strain Interventions Intervention Not Indicated  Physical Activity Interventions Other (Comments)  [Discussed increasing exercise by adding 2 to 3 days of walking after he completes 28 days of radiation for prostate cancer.]       CCM Care Plan  Allergies  Allergen Reactions   Latex Hives    Latex gloves     Medications Reviewed Today     Reviewed by [name], PharmD (Pharmacist) on 02/28/21 at [place] List Status: <None>   Medication Order Taking? Sig Documenting Provider Last Dose Status Informant  amLODipine (NORVASC) 10 MG tablet [id number] Yes TAKE 1 TABLET BY MOUTH EVERY DAY [name], MD Taking Active    aspirin EC 81 MG tablet [id number] Yes Take 81 mg by mouth daily. [provider] Taking Active   bimatoprost (LUMIGAN) 0.01 % SOLN [id number] Yes Place 1 drop into both eyes at bedtime. [provider] Taking Active   carvedilol (COREG) 25 MG tablet [id number] Yes TAKE 1 TABLET TWICE A DAY WITH FOOD [name], MD Taking Active   Cholecalciferol (VITAMIN D3) 50 MCG (2000 UT) CAPS [id number] Yes Take 1 capsule by mouth daily. [provider] Taking Active   clobetasol ointment (TEMOVATE) 0.05 % [id number] No apply to affected area twice a day UPON ITCHING AND IRRITATION(NOT TO FACE OR GROIN)  Patient not taking: No sig reported   [provider] Not Taking Active            Med Note (CANTER, Cheri Rous   Fri Aug 31, 2017 10:23 AM)  diazepam (VALIUM) 5 MG tablet [id number] Yes TAKE 1/2-2 TABLETS BY MOUTH DAILY AS NEEDED FOR ANXIETY. Copland, Gay Filler, MD Taking Active   ferrous sulfate 325 (65 FE) MG tablet [id number] Yes Take 1 tablet (325 mg total) by mouth 2 (two) times daily with a meal. [name], MD Taking Active   furosemide (LASIX) 40 MG tablet [id number] Yes Take 1 tablet (40 mg total) by mouth 2 (two) times daily. Burnell Blanks, MD Taking Active   hydrocortisone 2.5 % cream [id number] Yes Apply 1 application topically 2 (two) times daily. [provider] Taking Active            Med Note Harrel Lemon, Karem Tomaso T   Thu Sep 14, 2016  8:16 AM)    KLOR-CON M20 20 MEQ tablet [id number] Yes TAKE 3 TABLETS (60 MEQ TOTAL) BY MOUTH 3 (THREE) TIMES DAILY. [name], MD Taking Active   losartan (COZAAR) 100 MG tablet [id number] Yes TAKE 1 TABLET BY MOUTH EVERY DAY Burnell Blanks, MD Taking Active   Multiple Vitamin (MULITIVITAMIN WITH MINERALS) TABS [id number] Yes Take 1 tablet by mouth daily. [provider] Taking Active Spouse/Significant Other  nystatin cream (MYCOSTATIN) [id number] No Apply 1 application topically 2 (two) times  daily.  Patient not taking: No sig reported   Mackie Pai, [place] Not Taking Active   omeprazole (PRILOSEC) 40 MG capsule [id number] Yes TAKE 1 CAPSULE BY MOUTH EVERY DAY [name], MD Taking Active   tamsulosin (FLOMAX) 0.4 MG CAPS capsule [id number] Yes Take 1 capsule (0.4 mg total) by mouth daily. Bruning, Ashlyn, PA-C Taking Active   timolol (TIMOPTIC) 0.5 % ophthalmic solution [id number] Yes INSTILL 1 DROP INTO BOTH EYES TWICE A DAY [provider] Taking Active   triamterene-hydrochlorothiazide (MAXZIDE-25) 37.5-25 MG tablet [id number] Yes TAKE 1 TABLET BY MOUTH EVERY DAY [name], MD Taking Active             Patient Active Problem List   Diagnosis Date Noted   Malignant neoplasm of prostate ([place]) 02/03/2021   Trigger finger of right hand 09/22/2020   Anxiety attack 08/04/2019   Glaucoma 10/05/2016   Onychomycosis 03/09/2015   Hypokalemia 07/26/2014   Medicare annual wellness visit, subsequent 04/20/2014   Dyslipidemia 01/14/2013   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   Follow-up examination, following unspecified surgery 01/22/2012   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   Dystrophic nail 10/23/2011   Iron deficiency anemia 06/29/2011   Personal history of colonic polyps 06/29/2011   Family history of malignant neoplasm of gastrointestinal tract 06/29/2011   PSA, INCREASED 09/12/2010   CAD, NATIVE VESSEL 01/18/2010   Obesity, Class III, BMI 40-49.9 (morbid obesity) ([place]) 11/29/2009   Essential hypertension 04/05/2009   Hyperglycemia 04/05/2009   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   AORTIC INSUFFICIENCY, MODERATE 02/17/2009   CARDIOMYOPATHY, DILATED 02/17/2009   Thoracic aneurysm without mention of rupture 01/26/2009   Congestive heart failure ([place]) 01/04/2009   NONSPECIFIC ABNORMAL ELECTROCARDIOGRAM 01/04/2009    Immunization History  Administered Date(s) Administered   Fluad Quad(high Dose 65+) 04/22/2019,  05/07/2020   Influenza Split 05/15/2011, 05/22/2012   Influenza Whole 06/07/2009, 05/11/2010   Influenza, High Dose Seasonal PF 04/22/2015, 05/07/2017, 05/14/2018   Influenza,inj,Quad PF,6+ Mos 05/09/2013, 04/20/2014   Influenza-Unspecified 05/17/2016   Moderna Sars-Covid-2 Vaccination 09/20/2019, 10/28/2019, 07/06/2020   Pneumococcal Conjugate-13 05/09/2013   Pneumococcal Polysaccharide-23 04/16/2006, 04/22/2015   Td 04/14/2008    Conditions to be addressed/monitored: CHF, CAD, HTN,  HLD, Anxiety, and glaucoma, prostate CA, elevated PSA; BPH; iron def anemia;  chronic hypokalemia; GERD  Care Plan : General Pharmacy (Adult)  Updates made by [name], PHARMD since 03/03/2021 12:00 AM     Problem:  Chronic Disease Management support, education, and care coordination needs related to Hypertension, Hyperlipidemia/CAD, Heart Failure, Anxiety, GERD, BPH, Glaucoma, Anemia   Priority: High  Onset Date: 11/29/2020  Note:   Current Barriers:  Unable to maintain control of HTN History of CAD found when having AVR surgery; No current statin therapy  Pharmacist Clinical Goal(s):  Over the next 90 days, patient will achieve adherence to monitoring guidelines and medication adherence to achieve therapeutic efficacy achieve control of hyperlipidemia as evidenced by LDL <70 maintain control of HTN as evidenced by BP < 130/80  through collaboration with PharmD and provider.   Interventions: 1:1 collaboration with [name], MD regarding development and update of comprehensive plan of care as evidenced by provider attestation and co-signature Inter-disciplinary care team collaboration (see longitudinal plan of care) Comprehensive medication review performed; medication list updated in electronic medical record  Pre -Diabetes / Obesity: Improving; Patient reports losing about 26 lbs over the last few months thru dietary changes Initial weight 327lbs; Current weight 299 to 301lbs per  patient Current glucose readings: does not check BG at home Current meal patterns: eating more vegetables; has stopped drinking regular sodas (previously was drinking 2 per day at least); increase in water intake; no fried foods Current exercise: none (patient to start radiation x 28 days soon)  Interventions:  Plan to increase physical activity / walking after completes 28 days of radiation to keep weight down and prevent diabetes - goal is 150 minutes per week;  continue with dietary changes that have lead to weight loss.   Hypertension / CHF: Variable BP reading in office; BG goal <130/80  Current regimen:  Carvedilol 41m twice daily  Triamterene/hctz 37.5/25mg daily Amlodipine 182mdaily Losartan 10028maily  Furosemide 53m46mice a day Had 1 episode of dizziness which actually occurred when he was at cardiologist's office. EKG check and no reason for dizziness noted. Patient advised to contact office if dizziness returns.  Has been checking BP recently;  SBP range 130 to 140 DBP range 70's Interventions:  Recommended continue current medications;  Continue to check BP 2 to 3 times per week and record Discussed s/s of hypotension; patient reminded ot notify PCP or cardiologist if experience dizziness again.  Hyperlipidemia / CAD: Uncontrolled; LDL goal <70 Last LDL was 95 (12/07/2020) No current treatment Medications previously tried: none Current dietary patterns: limiting serving sizes and sodas; increase in vegetable Interventions:  Educated on physical activity as able;  continue to avoid sodas and high fat foods;  Recommended trial of statin if LDL remains >70  BPH / prostate cancer New diagnosis of prostate cancer 11/26/2020 Patient to start 28 days of radiation next week.  Current regimen:  Tamsulosin 0.4mg 29mly Interventions:  Continue current therapy and plan for prostate cancer treatment F/U with Rad Onc and urology as planned.   GERD:  Controlled Patient  denies reflux symptoms Current therapy:  Omeprazole 53mg 52my  Patient reports omprazole was started in hospital after his heart surgery. He does not recall ever having symptoms of reflux.  Interventions:  Discussed benefits versus risks of PPI therpay.  Recommended trial of taking omeprazole 53mg e24m other day. If no reflux symptoms after 1 month, then consider lowering dose to 20mg ev63mother day for  1 months, then stop if able.   Health Maintenance  Interventions: Recommend patient receive Td booster Recommend patient receive Shingrix. Check price for Shingrix at local pharmacy and receive if cost is acceptable. Recommend postponing vaccines until after completes 28 days of radiation  Medication management [facility] Comprehensive medication review performed and medication list updated.  Continue current medication management strategy Recommendations: Focus on medication adherence by filling and taking medications appropriately  Take medications as prescribed Report any questions or concerns to PharmD and/or provider(s)  Patient Goals/Self-Care Activities Over the next 90 days, patient will:  take medications as prescribed, check blood pressure 2 to 3 times per week, document, and provide at future appointments, target a minimum of 150 minutes of moderate intensity exercise weekly, and engage in dietary modifications by continuing to avoid sodas, eat more vegetable and limit high fat foods  Follow Up Plan: Telephone follow up appointment with care management team member scheduled for:  3 months      Medication Assistance: None required.  Patient affirms current coverage meets needs.  [facility]'s [id number] [id number]: [phone]: [phone]  Follow Up:  Patient agrees to Care Plan and Follow-up.  Plan: Telephone follow  up appointment with care management team member scheduled for:  1 month  TCherre Robins PharmD Clinical Pharmacist [facility]

## 2021-03-03 NOTE — Progress Notes (Signed)
  Radiation Oncology         [phone] ________________________________  Name: David Parsons MRN: [id number]  Date: 03/04/2021  DOB: 22-Aug-1940  SIMULATION AND TREATMENT PLANNING NOTE    ICD-10-CM   1. Malignant neoplasm of prostate ([place])  C61       DIAGNOSIS:  80 y.o. gentleman with Stage T1c adenocarcinoma of the prostate with Gleason score of 4+3, and PSA of 13.40.  NARRATIVE:  The patient was brought to the [facility].  Identity was confirmed.  All relevant records and images related to the planned course of therapy were reviewed.  The patient freely provided informed written consent to proceed with treatment after reviewing the details related to the planned course of therapy. The consent form was witnessed and verified by the simulation staff.  Then, the patient was set-up in a stable reproducible supine position for radiation therapy.  A vacuum lock pillow device was custom fabricated to position his legs in a reproducible immobilized position.  Then, I performed a urethrogram under sterile conditions to identify the prostatic apex.  CT images were obtained.  Surface markings were placed.  The CT images were loaded into the planning software.  Then the prostate target and avoidance structures including the rectum, bladder, bowel and hips were contoured.  Treatment planning then occurred.  The radiation prescription was entered and confirmed.  A total of one complex treatment devices was fabricated. I have requested : Intensity Modulated Radiotherapy (IMRT) is medically necessary for this case for the following reason:  Rectal sparing.Marland Kitchen  PLAN:  The patient will receive 70 Gy in 28 fractions.  ________________________________  Sheral Apley Tammi Klippel, M.D.

## 2021-03-03 NOTE — Telephone Encounter (Signed)
Called patient to remind of sim for 03-04-21- arrival time- 8:15 am @ [facility], spoke with patient and he is aware of this appt.

## 2021-03-04 ENCOUNTER — Ambulatory Visit
Admission: RE | Admit: 2021-03-04 | Discharge: 2021-03-04 | Disposition: A | Discharge status: Home / Self Care | Payer: Medicare Other | Source: Ambulatory Visit | Attending: Radiation Oncology | Admitting: Radiation Oncology

## 2021-03-04 ENCOUNTER — Other Ambulatory Visit: Payer: Self-pay

## 2021-03-04 DIAGNOSIS — C61 Malignant neoplasm of prostate: Secondary | ICD-10-CM | POA: Diagnosis not present

## 2021-03-04 DIAGNOSIS — Z510 Encounter for antineoplastic radiation therapy: Secondary | ICD-10-CM | POA: Diagnosis not present

## 2021-03-14 DIAGNOSIS — C61 Malignant neoplasm of prostate: Secondary | ICD-10-CM | POA: Diagnosis not present

## 2021-03-14 DIAGNOSIS — Z510 Encounter for antineoplastic radiation therapy: Secondary | ICD-10-CM | POA: Diagnosis not present

## 2021-03-15 ENCOUNTER — Ambulatory Visit
Admission: RE | Admit: 2021-03-15 | Discharge: 2021-03-15 | Disposition: A | Discharge status: Home / Self Care | Payer: Medicare Other | Source: Ambulatory Visit | Attending: Radiation Oncology | Admitting: Radiation Oncology

## 2021-03-15 ENCOUNTER — Other Ambulatory Visit: Payer: Self-pay

## 2021-03-15 DIAGNOSIS — Z510 Encounter for antineoplastic radiation therapy: Secondary | ICD-10-CM | POA: Diagnosis not present

## 2021-03-15 DIAGNOSIS — C61 Malignant neoplasm of prostate: Secondary | ICD-10-CM | POA: Diagnosis not present

## 2021-03-16 ENCOUNTER — Ambulatory Visit
Admission: RE | Admit: 2021-03-16 | Discharge: 2021-03-16 | Disposition: A | Discharge status: Home / Self Care | Payer: Medicare Other | Source: Ambulatory Visit | Attending: Radiation Oncology | Admitting: Radiation Oncology

## 2021-03-16 DIAGNOSIS — C61 Malignant neoplasm of prostate: Secondary | ICD-10-CM | POA: Diagnosis not present

## 2021-03-16 DIAGNOSIS — Z510 Encounter for antineoplastic radiation therapy: Secondary | ICD-10-CM | POA: Diagnosis not present

## 2021-03-17 ENCOUNTER — Other Ambulatory Visit: Payer: Self-pay

## 2021-03-17 ENCOUNTER — Ambulatory Visit
Admission: RE | Admit: 2021-03-17 | Discharge: 2021-03-17 | Disposition: A | Discharge status: Home / Self Care | Payer: Medicare Other | Source: Ambulatory Visit | Attending: Radiation Oncology | Admitting: Radiation Oncology

## 2021-03-17 DIAGNOSIS — Z510 Encounter for antineoplastic radiation therapy: Secondary | ICD-10-CM | POA: Diagnosis not present

## 2021-03-17 DIAGNOSIS — C61 Malignant neoplasm of prostate: Secondary | ICD-10-CM | POA: Diagnosis not present

## 2021-03-18 ENCOUNTER — Ambulatory Visit
Admission: RE | Admit: 2021-03-18 | Discharge: 2021-03-18 | Disposition: A | Discharge status: Home / Self Care | Payer: Medicare Other | Source: Ambulatory Visit | Attending: Radiation Oncology | Admitting: Radiation Oncology

## 2021-03-18 DIAGNOSIS — Z510 Encounter for antineoplastic radiation therapy: Secondary | ICD-10-CM | POA: Diagnosis not present

## 2021-03-18 DIAGNOSIS — C61 Malignant neoplasm of prostate: Secondary | ICD-10-CM | POA: Diagnosis not present

## 2021-03-21 ENCOUNTER — Other Ambulatory Visit: Payer: Self-pay

## 2021-03-21 ENCOUNTER — Ambulatory Visit
Admission: RE | Admit: 2021-03-21 | Discharge: 2021-03-21 | Disposition: A | Discharge status: Home / Self Care | Payer: Medicare Other | Source: Ambulatory Visit | Attending: Radiation Oncology | Admitting: Radiation Oncology

## 2021-03-21 DIAGNOSIS — H25813 Combined forms of age-related cataract, bilateral: Secondary | ICD-10-CM | POA: Diagnosis not present

## 2021-03-21 DIAGNOSIS — Z510 Encounter for antineoplastic radiation therapy: Secondary | ICD-10-CM | POA: Diagnosis not present

## 2021-03-21 DIAGNOSIS — H04123 Dry eye syndrome of bilateral lacrimal glands: Secondary | ICD-10-CM | POA: Diagnosis not present

## 2021-03-21 DIAGNOSIS — H401132 Primary open-angle glaucoma, bilateral, moderate stage: Secondary | ICD-10-CM | POA: Diagnosis not present

## 2021-03-21 DIAGNOSIS — C61 Malignant neoplasm of prostate: Secondary | ICD-10-CM | POA: Insufficient documentation

## 2021-03-21 DIAGNOSIS — H35371 Puckering of macula, right eye: Secondary | ICD-10-CM | POA: Diagnosis not present

## 2021-03-22 ENCOUNTER — Ambulatory Visit
Admission: RE | Admit: 2021-03-22 | Discharge: 2021-03-22 | Disposition: A | Discharge status: Home / Self Care | Payer: Medicare Other | Source: Ambulatory Visit | Attending: Radiation Oncology | Admitting: Radiation Oncology

## 2021-03-22 DIAGNOSIS — C61 Malignant neoplasm of prostate: Secondary | ICD-10-CM | POA: Diagnosis not present

## 2021-03-22 DIAGNOSIS — Z510 Encounter for antineoplastic radiation therapy: Secondary | ICD-10-CM | POA: Diagnosis not present

## 2021-03-23 ENCOUNTER — Ambulatory Visit
Admission: RE | Admit: 2021-03-23 | Discharge: 2021-03-23 | Disposition: A | Discharge status: Home / Self Care | Payer: Medicare Other | Source: Ambulatory Visit | Attending: Radiation Oncology | Admitting: Radiation Oncology

## 2021-03-23 ENCOUNTER — Other Ambulatory Visit: Payer: Self-pay

## 2021-03-23 DIAGNOSIS — Z510 Encounter for antineoplastic radiation therapy: Secondary | ICD-10-CM | POA: Diagnosis not present

## 2021-03-23 DIAGNOSIS — C61 Malignant neoplasm of prostate: Secondary | ICD-10-CM | POA: Diagnosis not present

## 2021-03-24 ENCOUNTER — Ambulatory Visit
Admission: RE | Admit: 2021-03-24 | Discharge: 2021-03-24 | Disposition: A | Discharge status: Home / Self Care | Payer: Medicare Other | Source: Ambulatory Visit | Attending: Radiation Oncology | Admitting: Radiation Oncology

## 2021-03-24 DIAGNOSIS — C61 Malignant neoplasm of prostate: Secondary | ICD-10-CM | POA: Diagnosis not present

## 2021-03-24 DIAGNOSIS — Z510 Encounter for antineoplastic radiation therapy: Secondary | ICD-10-CM | POA: Diagnosis not present

## 2021-03-25 ENCOUNTER — Ambulatory Visit
Admission: RE | Admit: 2021-03-25 | Discharge: 2021-03-25 | Disposition: A | Discharge status: Home / Self Care | Payer: Medicare Other | Source: Ambulatory Visit | Attending: Radiation Oncology | Admitting: Radiation Oncology

## 2021-03-25 DIAGNOSIS — C61 Malignant neoplasm of prostate: Secondary | ICD-10-CM | POA: Diagnosis not present

## 2021-03-25 DIAGNOSIS — Z510 Encounter for antineoplastic radiation therapy: Secondary | ICD-10-CM | POA: Diagnosis not present

## 2021-03-28 ENCOUNTER — Ambulatory Visit
Admission: RE | Admit: 2021-03-28 | Discharge: 2021-03-28 | Disposition: A | Discharge status: Home / Self Care | Payer: Medicare Other | Source: Ambulatory Visit | Attending: Radiation Oncology | Admitting: Radiation Oncology

## 2021-03-28 ENCOUNTER — Other Ambulatory Visit: Payer: Self-pay

## 2021-03-28 DIAGNOSIS — C61 Malignant neoplasm of prostate: Secondary | ICD-10-CM | POA: Diagnosis not present

## 2021-03-28 DIAGNOSIS — Z510 Encounter for antineoplastic radiation therapy: Secondary | ICD-10-CM | POA: Diagnosis not present

## 2021-03-29 ENCOUNTER — Ambulatory Visit
Admission: RE | Admit: 2021-03-29 | Discharge: 2021-03-29 | Disposition: A | Discharge status: Home / Self Care | Payer: Medicare Other | Source: Ambulatory Visit | Attending: Radiation Oncology | Admitting: Radiation Oncology

## 2021-03-29 DIAGNOSIS — Z510 Encounter for antineoplastic radiation therapy: Secondary | ICD-10-CM | POA: Diagnosis not present

## 2021-03-29 DIAGNOSIS — C61 Malignant neoplasm of prostate: Secondary | ICD-10-CM | POA: Diagnosis not present

## 2021-03-30 ENCOUNTER — Other Ambulatory Visit: Payer: Self-pay

## 2021-03-30 ENCOUNTER — Ambulatory Visit
Admission: RE | Admit: 2021-03-30 | Discharge: 2021-03-30 | Disposition: A | Discharge status: Home / Self Care | Payer: Medicare Other | Source: Ambulatory Visit | Attending: Radiation Oncology | Admitting: Radiation Oncology

## 2021-03-30 ENCOUNTER — Ambulatory Visit (INDEPENDENT_AMBULATORY_CARE_PROVIDER_SITE_OTHER): Payer: Medicare Other | Admitting: Pharmacist

## 2021-03-30 DIAGNOSIS — C61 Malignant neoplasm of prostate: Secondary | ICD-10-CM

## 2021-03-30 DIAGNOSIS — E66813 Obesity, class 3: Secondary | ICD-10-CM

## 2021-03-30 DIAGNOSIS — E785 Hyperlipidemia, unspecified: Secondary | ICD-10-CM

## 2021-03-30 DIAGNOSIS — I10 Essential (primary) hypertension: Secondary | ICD-10-CM

## 2021-03-30 DIAGNOSIS — I2510 Atherosclerotic heart disease of native coronary artery without angina pectoris: Secondary | ICD-10-CM | POA: Diagnosis not present

## 2021-03-30 DIAGNOSIS — R739 Hyperglycemia, unspecified: Secondary | ICD-10-CM

## 2021-03-30 DIAGNOSIS — I509 Heart failure, unspecified: Secondary | ICD-10-CM

## 2021-03-30 DIAGNOSIS — Z510 Encounter for antineoplastic radiation therapy: Secondary | ICD-10-CM | POA: Diagnosis not present

## 2021-03-30 NOTE — Patient Instructions (Signed)
Visit Information  PATIENT GOALS:  Goals Addressed             This Visit's Progress    Chronic Care Management Pharmacy Care Plan   On track    CARE PLAN ENTRY (see longitudinal plan of care for additional care plan information)  Current Barriers:  Chronic Disease Management support, education, and care coordination needs related to Hypertension, Hyperlipidemia/CAD, Heart Failure, Anxiety, GERD, BPH, Glaucoma, Anemia, Onychomycosis   Hypertension / Thoracic aneurysm / CHF BP Readings from Last 3 Encounters:  10/25/20 114/86  06/17/20 (!) 161/92  05/10/20 130/70  Pharmacist Clinical Goal(s): Over the next 90 days, patient will work with PharmD and providers to maintain BP goal <130/80 Current regimen:  Carvedilol '25mg'$  twice daily  Triamterene/hctz 37.5/'25mg'$  daily Amlodipine '10mg'$  daily Losartan '100mg'$  daily Furosemide '40mg'$  twice daily Interventions: Discussed blood pressure goal Recommended check blood pressure at home 2 to 3 times per week and record Patient self care activities - Over the next 90 days, patient will: Check blood pressure 2 to 3 times week, document, and provide at future appointments Ensure daily salt intake < 2300 mg/day Continue current regimen for blood pressure control Ask nurse at radiation appointment to check blood pressure sitting and standing. Report readings back to me.  Hyperlipidemia/Hx of CAD Lab Results  Component Value Date/Time   [facility] 95 12/07/2020 09:28 AM  Pharmacist Clinical Goal(s): Over the next 90 days, patient will work with PharmD and providers to achieve LDL goal < 70 Current regimen:  Aspirin '81mg'$  daily Interventions: Discussed LDL goal Considering statin therapy - will hold off until after upcoming radiation therpay Patient self care activities - Over the next 90 days, patient will: Consider adding statin after completed radiation therapy Continue to avoid sodas and high fat foods;   BPH / Prostate cancer Pharmacist  Clinical Goal(s) Over the next 90 days, patient will work with PharmD and providers to reduce symptoms associated with BPH Current regimen:  Tamsulosin 0.'4mg'$  daily  Patient self care activities - Over the next 90 days, patient will: Continue current therapy Continue to follow up with Dr Tammi Klippel and urologist  GERD / acid reflux  Pharmacist Clinical Goal(s) Over the next 90 days, patient will work with PharmD and providers to prevent acid reflux symptoms and assess if omeprazole is still needed.  Current therapy:  Omeprazole '40mg'$  daily  Interventions:  Discussed benefits versus risks of PPI therpay.  Recommended trial of taking omeprazole '40mg'$  every other day. If no reflux symptoms after 1 month, then consider lowering dose to '20mg'$  every other day for 1 months, then stop if able.  Patient self care activities - Over the next 90 days, patient will:  Continue to take omeprazole '40mg'$  daily.    Health Maintenance  Pharmacist Clinical Goal(s) Over the next 90 days, patient will work with PharmD and providers to complete health maintenance screenings/vaccinations Interventions: Recommend patient receive Td booster Recommend patient receive Shingrix Patient self care activities - Over the next 90 days, patient will: Receive Td booster (will get after completed radiation therapy)  Check price for Shingrix and receive if cost is acceptable. (will check after completed radiation therapy)  Recommend starting walking program for exercise after completing radiation. Exercise can help keep blood glucose, blood pressure and cholesterol down. Goal it to get at least 150 minutes of physical activity per week.   Medication management Pharmacist Clinical Goal(s): Over the next 90 days, patient will work with PharmD and providers to maintain optimal medication  adherence [facility] Comprehensive medication review performed and medication list updated.  Continue current  medication management strategy Patient self care activities - Over the next 90 days, patient will: Focus on medication adherence by filling and taking medications appropriately  Take medications as prescribed Report any questions or concerns to PharmD and/or provider(s)        Increase water intake   On track    Weight (lb) < 300 lb (136.1 kg)          The patient verbalized understanding of instructions, educational materials, and care plan provided today and declined offer to receive copy of patient instructions, educational materials, and care plan.   Telephone follow up appointment with care management team member scheduled for: 3 months  [name], PharmD Clinical Pharmacist [facility]

## 2021-03-30 NOTE — Chronic Care Management (AMB) (Signed)
Chronic Care Management Pharmacy Note  03/30/2021 Name:  David Parsons MRN:  [id number] DOB:  [birth year]  Summary: Patient started radiation July 2022 for prostate cancer LDL not at goal  Experiencing occasional dizziness with standing from seated position  Recommendations/Changes made from today's visit: Consider starting statin after completes radiation therapy Check orthostatic BPs at next appt 04/12/21   Subjective: David Parsons is an 80 y.o. year old male who is a primary patient of [name], MD.  The CCM team was consulted for assistance with disease management and care coordination needs.    Engaged with patient by telephone for follow up visit in response to provider referral for pharmacy case management and/or care coordination services.   Consent to Services:  The patient was given information about Chronic Care Management services, agreed to services, and gave verbal consent prior to initiation of services.  Please see initial visit note for detailed documentation.   Patient Care Team: [name], MD as PCP - General (Family Medicine) Burnell Blanks, MD as Consulting Physician (Cardiology) Gatha Mayer, MD as Consulting Physician (Gastroenterology) Druscilla Brownie, MD as Consulting Physician (Dermatology) Calvert Cantor, MD as Consulting Physician (Ophthalmology) Carolan Clines, MD (Inactive) as Consulting Physician (Urology) Gardiner Barefoot, DPM as Consulting Physician (Podiatry) [name], PharmD (Pharmacist)  Recent office visits: 11/23/2020 - PCP ([name]) Video visit to f/u chronic medical concerns. Labs ordered - A1c, CBC, lipids, CMP and TSH for future. No medication changes.  Recent consult visits: 02/03/2021 - Rad Onc (Dr Tammi Klippel) Phone Visit - Consult for possible radiation.  Stage T1c adenocarcinoma of the prostate with Gleason score of 4+3, and PSA of 13.40. At the end of the conversation the patient is interested in  moving forward with 5.5- 8 weeks of external beam therapy.  He is leaning towards a 5-1/2-week course but giving consideration to the 8-week course to reduce LUTS associated with the radiation treatments.   11/03/2020 - Ortho (Dr Ivin Booty) Follow-up right long trigger finger. Symptoms have resolved after injection. No medication changes  10/25/2020 - Cardio (Dr Angelena Form) for cardiac follow up. No medication changes   09/24/2020 - Podiatry (Dr Elisha Ponder) presents to clinic today for painful callus(es) b/l and painful thick toenails that are difficult to trim. No medication changes Hospital visits: None in previous 6 months  Objective:  Lab Results  Component Value Date   CREATININE 1.00 12/07/2020   CREATININE 0.99 10/28/2019   CREATININE 0.88 04/22/2019    Lab Results  Component Value Date   HGBA1C 5.4 12/07/2020   Last diabetic Eye exam: No results found for: HMDIABEYEEXA  Last diabetic Foot exam: No results found for: HMDIABFOOTEX      Component Value Date/Time   CHOL 160 12/07/2020 0928   TRIG 145.0 12/07/2020 0928   HDL 36.10 (L) 12/07/2020 0928   CHOLHDL 4 12/07/2020 0928   VLDL 29.0 12/07/2020 0928   LDLCALC 95 12/07/2020 0928    Hepatic Function Latest Ref Rng & Units 12/07/2020 10/28/2019 04/22/2019  Total Protein 6.0 - 8.3 g/dL 6.9 6.9 6.8  Albumin 3.5 - 5.2 g/dL 3.9 3.9 4.0  AST 0 - 37 U/L _0 ALT 0 - 53 U/L _1 Alk Phosphatase 39 - 117 U/L 75 72 71  Total Bilirubin 0.2 - 1.2 mg/dL 1.4(H) 1.0 1.1  Bilirubin, Direct 0.0 - 0.3 mg/dL - - -    Lab Results  Component Value Date/Time   TSH 1.79 12/07/2020  09:28 AM   TSH 2.36 04/22/2019 08:32 AM    CBC Latest Ref Rng & Units 12/07/2020 05/10/2020 05/03/2020  WBC 4.0 - 10.5 K/uL 8.4 10.0 16.5(H)  Hemoglobin 13.0 - 17.0 g/dL 13.9 13.1(L) 13.1(L)  Hematocrit 39.0 - 52.0 % 42.1 40.3 39.3  Platelets 150.0 - 400.0 K/uL 284.0 454(H) 255    No results found for: VD25OH  Clinical ASCVD: Yes  The 10-year ASCVD  risk score Mikey Bussing DC Jr., et al., 2013) is: 36.1%   Values used to calculate the score:     Age: 73 years     Sex: Male     Is Non-Hispanic African American: Yes     Diabetic: Yes     Tobacco smoker: No     Systolic Blood Pressure: 128 mmHg     Is BP treated: Yes     HDL Cholesterol: 36.1 mg/dL     Total Cholesterol: 160 mg/dL     Social History   Tobacco Use  Smoking Status Never  Smokeless Tobacco Never   BP Readings from Last 3 Encounters:  10/25/20 114/86  06/17/20 (!) 161/92  05/10/20 130/70   Pulse Readings from Last 3 Encounters:  10/25/20 66  06/17/20 86  05/10/20 72   Wt Readings from Last 3 Encounters:  10/25/20 (!) 310 lb (140.6 kg)  05/10/20 (!) 317 lb (143.8 kg)  05/03/20 (!) 312 lb 6.4 oz (141.7 kg)    Assessment: Review of patient past medical history, allergies, medications, health status, including review of consultants reports, laboratory and other test data, was performed as part of comprehensive evaluation and provision of chronic care management services.   SDOH:  (Social Determinants of Health) assessments and interventions performed:     CCM Care Plan  Allergies  Allergen Reactions   Latex Hives    Latex gloves     Medications Reviewed Today     Reviewed by [name], PharmD (Pharmacist) on 03/30/21 at 1107  Med List Status: <None>   Medication Order Taking? Sig Documenting Provider Last Dose Status Informant  amLODipine (NORVASC) 10 MG tablet [id number] Yes TAKE 1 TABLET BY MOUTH EVERY DAY [name], MD Taking Active   aspirin EC 81 MG tablet [id number] Yes Take 81 mg by mouth daily. [provider] Taking Active   bimatoprost (LUMIGAN) 0.01 % SOLN [id number] Yes Place 1 drop into both eyes at bedtime. [provider] Taking Active   carvedilol (COREG) 25 MG tablet [id number] Yes TAKE 1 TABLET TWICE A DAY WITH FOOD [name], MD Taking Active   Cholecalciferol (VITAMIN D3) 50 MCG (2000 UT) CAPS [id number]  Yes Take 1 capsule by mouth daily. [provider] Taking Active   clobetasol ointment (TEMOVATE) 0.05 % [id number] No apply to affected area twice a day UPON ITCHING AND IRRITATION(NOT TO FACE OR GROIN)  Patient not taking: No sig reported   [provider] Not Taking Active            Med Note (CANTER, Vilma Prader D   Fri Aug 31, 2017 10:23 AM)    diazepam (VALIUM) 5 MG tablet [id number] Yes TAKE 1/2-2 TABLETS BY MOUTH DAILY AS NEEDED FOR ANXIETY. Copland, Gay Filler, MD Taking Active   ferrous sulfate 325 (65 FE) MG tablet [id number] Yes Take 1 tablet (325 mg total) by mouth 2 (two) times daily with a meal. [name], MD Taking Active   furosemide (LASIX) 40 MG tablet [id number] Yes Take 1 tablet (40 mg total) by  mouth 2 (two) times daily. Burnell Blanks, MD Taking Active   hydrocortisone 2.5 % cream [id number] Yes Apply 1 application topically 2 (two) times daily. [provider] Taking Active            Med Note Harrel Lemon, Addylin Manke T   Thu Sep 14, 2016  8:16 AM)    KLOR-CON M20 20 MEQ tablet [id number] Yes TAKE 3 TABLETS (60 MEQ TOTAL) BY MOUTH 3 (THREE) TIMES DAILY. [name], MD Taking Active   losartan (COZAAR) 100 MG tablet [id number] Yes TAKE 1 TABLET BY MOUTH EVERY DAY Burnell Blanks, MD Taking Active   Multiple Vitamin (MULITIVITAMIN WITH MINERALS) TABS [id number] Yes Take 1 tablet by mouth daily. [provider] Taking Active Spouse/Significant Other  nystatin cream (MYCOSTATIN) [id number] No Apply 1 application topically 2 (two) times daily.  Patient not taking: No sig reported   Mackie Pai, [place] Not Taking Active   omeprazole (PRILOSEC) 40 MG capsule [id number] Yes TAKE 1 CAPSULE BY MOUTH EVERY DAY [name], MD Taking Active   tamsulosin (FLOMAX) 0.4 MG CAPS capsule [id number] Yes Take 1 capsule (0.4 mg total) by mouth daily. Bruning, Ashlyn, PA-C Taking Active   timolol (TIMOPTIC) 0.5 % ophthalmic solution [id number] Yes INSTILL  1 DROP INTO BOTH EYES TWICE A DAY [provider] Taking Active   triamterene-hydrochlorothiazide (MAXZIDE-25) 37.5-25 MG tablet [id number] Yes TAKE 1 TABLET BY MOUTH EVERY DAY [name], MD Taking Active             Patient Active Problem List   Diagnosis Date Noted   Malignant neoplasm of prostate ([place]) 02/03/2021   Trigger finger of right hand 09/22/2020   Anxiety attack 08/04/2019   Glaucoma 10/05/2016   Onychomycosis 03/09/2015   Hypokalemia 07/26/2014   Medicare annual wellness visit, subsequent 04/20/2014   Dyslipidemia 01/14/2013   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   Follow-up examination, following unspecified surgery 01/22/2012   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   Dystrophic nail 10/23/2011   Iron deficiency anemia 06/29/2011   Personal history of colonic polyps 06/29/2011   Family history of malignant neoplasm of gastrointestinal tract 06/29/2011   PSA, INCREASED 09/12/2010   CAD, NATIVE VESSEL 01/18/2010   Obesity, Class III, BMI 40-49.9 (morbid obesity) ([place]) 11/29/2009   Essential hypertension 04/05/2009   Hyperglycemia 04/05/2009   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   AORTIC INSUFFICIENCY, MODERATE 02/17/2009   CARDIOMYOPATHY, DILATED 02/17/2009   Thoracic aneurysm without mention of rupture 01/26/2009   Congestive heart failure ([place]) 01/04/2009   NONSPECIFIC ABNORMAL ELECTROCARDIOGRAM 01/04/2009    Immunization History  Administered Date(s) Administered   Fluad Quad(high Dose 65+) 04/22/2019, 05/07/2020   Influenza Split 05/15/2011, 05/22/2012   Influenza Whole 06/07/2009, 05/11/2010   Influenza, High Dose Seasonal PF 04/22/2015, 05/07/2017, 05/14/2018   Influenza,inj,Quad PF,6+ Mos 05/09/2013, 04/20/2014   Influenza-Unspecified 05/17/2016   Moderna Sars-Covid-2 Vaccination 09/20/2019, 10/28/2019, 07/06/2020   Pneumococcal Conjugate-13 05/09/2013   Pneumococcal Polysaccharide-23 04/16/2006, 04/22/2015    Td 04/14/2008    Conditions to be addressed/monitored: CHF, CAD, HTN, HLD, Anxiety, and glaucoma, prostate CA, elevated PSA; BPH; iron def anemia;  chronic hypokalemia; GERD  Care Plan : General Pharmacy (Adult)  Updates made by [name], PHARMD since 03/30/2021 12:00 AM     Problem:  Chronic Disease Management support, education, and care coordination needs related to Hypertension, Hyperlipidemia/CAD, Heart Failure, Anxiety, GERD, BPH, Glaucoma, Anemia   Priority: High  Onset Date: 11/29/2020  Note:  Current Barriers:  Unable to maintain control of HTN History of CAD found when having AVR surgery; No current statin therapy  Pharmacist Clinical Goal(s):  Over the next 90 days, patient will achieve adherence to monitoring guidelines and medication adherence to achieve therapeutic efficacy achieve control of hyperlipidemia as evidenced by LDL <70 maintain control of HTN as evidenced by BP < 130/80  through collaboration with PharmD and provider.   Interventions: 1:1 collaboration with [name], MD regarding development and update of comprehensive plan of care as evidenced by provider attestation and co-signature Inter-disciplinary care team collaboration (see longitudinal plan of care) Comprehensive medication review performed; medication list updated in electronic medical record  Pre -Diabetes / Obesity: Improving; Patient reports losing about 30 lbs over the last few months thru dietary changes Initial weight 327lbs; Current weight 297 lbs per patient Current glucose readings: does not check BG at home Current meal patterns: eating more vegetables; has stopped drinking regular sodas (previously was drinking 2 per day at least); increase in water intake; no fried foods Current exercise: none (patient to started radiation in July 2022 - has 17 treatments left)  Interventions:  Plan to increase physical activity / walking after completes 28 days of radiation to keep  weight down and prevent diabetes - goal is 150 minutes per week;  continue with dietary changes that have lead to weight loss.   Hypertension / CHF: Recent BP reading at radiation appointments have been improved; BG goal <130/80 03/25/2021 BP was 133/72 03/18/2021 BP was 137/84  BP Readings from Last 3 Encounters:  10/25/20 114/86  06/17/20 (!) 161/92  05/10/20 130/70   Current regimen:  Carvedilol 35m twice daily  Triamterene/hctz 37.5/25mg daily Amlodipine 133mdaily Losartan 10033maily  Furosemide 84m32mice a day Patient reports he has experience occasional dizziness when rising form chair or car. Does not feel that he will fall.  Interventions:  Recommended continue current medications;  Continue to check BP 2 to 3 times per week and record Discussed s/s of hypotension; Recommended he ask nurse at radiation appointment to check blood pressure sitting and standing. Patient report reading back to me.  Hyperlipidemia / CAD: Uncontrolled; LDL goal <70 Last LDL was 95 (12/07/2020) No current treatment Medications previously tried: none Current dietary patterns: limiting serving sizes and sodas; increase in vegetable Interventions:  Educated on physical activity as able;  continue to avoid sodas and high fat foods;  Recommended trial of statin if LDL remains >70  BPH / prostate cancer New diagnosis of prostate cancer 11/26/2020 Patient receiving 28 days of radiation - has about 17 treatments left. Current regimen:  Tamsulosin 0.4mg 21mly Interventions:  Continue current therapy and plan for prostate cancer treatment F/U with Rad Onc and urology as planned.   GERD:  Controlled Current therapy:  Omeprazole 84mg 17my  Patient reports omprazole was started in hospital after his heart surgery. He does not recall ever having symptoms of reflux.  He tried taking omeprazole every other day after or last appointment and noticed reflux symptoms when he laid down at night. He  restart daily omeprazole and symptoms improved.  Interventions:  Continue to take omeprazole 84mg d51m.   Health Maintenance  Interventions: Recommend patient receive Td booster Recommend patient receive Shingrix. Check price for Shingrix at local pharmacy and receive if cost is acceptable. Recommend postponing vaccines until after completes 28 days of radiation  Medication management Current pharmacy: [facility] Interventions Comprehensive medication review performed and medication list updated.  Continue  current medication management strategy Recommendations: Focus on medication adherence by filling and taking medications appropriately  Take medications as prescribed Report any questions or concerns to PharmD and/or provider(s)  Patient Goals/Self-Care Activities Over the next 90 days, patient will:  take medications as prescribed, check blood pressure 2 to 3 times per week, document, and provide at future appointments, target a minimum of 150 minutes of moderate intensity exercise weekly, and engage in dietary modifications by continuing to avoid sodas, eat more vegetable and limit high fat foods  Follow Up Plan: Telephone follow up appointment with care management team member scheduled for:  3 months      Medication Assistance: None required.  Patient affirms current coverage meets needs.  Patient's preferred pharmacy is:  [id number] [id number]: [phone]: [phone]  Follow Up:  Patient agrees to Care Plan and Follow-up.  Plan: Telephone follow up appointment with care management team member scheduled for:  3 months  TCherre Robins PharmD Clinical Pharmacist [facility]

## 2021-03-31 ENCOUNTER — Ambulatory Visit
Admission: RE | Admit: 2021-03-31 | Discharge: 2021-03-31 | Disposition: A | Discharge status: Home / Self Care | Payer: Medicare Other | Source: Ambulatory Visit | Attending: Radiation Oncology | Admitting: Radiation Oncology

## 2021-03-31 ENCOUNTER — Other Ambulatory Visit: Payer: Self-pay | Admitting: Family Medicine

## 2021-03-31 DIAGNOSIS — C61 Malignant neoplasm of prostate: Secondary | ICD-10-CM | POA: Diagnosis not present

## 2021-03-31 DIAGNOSIS — Z510 Encounter for antineoplastic radiation therapy: Secondary | ICD-10-CM | POA: Diagnosis not present

## 2021-04-01 ENCOUNTER — Other Ambulatory Visit: Payer: Self-pay

## 2021-04-01 ENCOUNTER — Ambulatory Visit
Admission: RE | Admit: 2021-04-01 | Discharge: 2021-04-01 | Disposition: A | Discharge status: Home / Self Care | Payer: Medicare Other | Source: Ambulatory Visit | Attending: Radiation Oncology | Admitting: Radiation Oncology

## 2021-04-01 DIAGNOSIS — C61 Malignant neoplasm of prostate: Secondary | ICD-10-CM | POA: Diagnosis not present

## 2021-04-01 DIAGNOSIS — Z510 Encounter for antineoplastic radiation therapy: Secondary | ICD-10-CM | POA: Diagnosis not present

## 2021-04-04 ENCOUNTER — Ambulatory Visit
Admission: RE | Admit: 2021-04-04 | Discharge: 2021-04-04 | Disposition: A | Discharge status: Home / Self Care | Payer: Medicare Other | Source: Ambulatory Visit | Attending: Radiation Oncology | Admitting: Radiation Oncology

## 2021-04-04 ENCOUNTER — Other Ambulatory Visit: Payer: Self-pay

## 2021-04-04 DIAGNOSIS — C61 Malignant neoplasm of prostate: Secondary | ICD-10-CM | POA: Diagnosis not present

## 2021-04-04 DIAGNOSIS — Z510 Encounter for antineoplastic radiation therapy: Secondary | ICD-10-CM | POA: Diagnosis not present

## 2021-04-05 ENCOUNTER — Ambulatory Visit
Admission: RE | Admit: 2021-04-05 | Discharge: 2021-04-05 | Disposition: A | Discharge status: Home / Self Care | Payer: Medicare Other | Source: Ambulatory Visit | Attending: Radiation Oncology | Admitting: Radiation Oncology

## 2021-04-05 DIAGNOSIS — C61 Malignant neoplasm of prostate: Secondary | ICD-10-CM | POA: Diagnosis not present

## 2021-04-05 DIAGNOSIS — Z510 Encounter for antineoplastic radiation therapy: Secondary | ICD-10-CM | POA: Diagnosis not present

## 2021-04-06 ENCOUNTER — Ambulatory Visit
Admission: RE | Admit: 2021-04-06 | Discharge: 2021-04-06 | Disposition: A | Discharge status: Home / Self Care | Payer: Medicare Other | Source: Ambulatory Visit | Attending: Radiation Oncology | Admitting: Radiation Oncology

## 2021-04-06 ENCOUNTER — Other Ambulatory Visit: Payer: Self-pay

## 2021-04-06 DIAGNOSIS — C61 Malignant neoplasm of prostate: Secondary | ICD-10-CM | POA: Diagnosis not present

## 2021-04-06 DIAGNOSIS — Z510 Encounter for antineoplastic radiation therapy: Secondary | ICD-10-CM | POA: Diagnosis not present

## 2021-04-07 ENCOUNTER — Ambulatory Visit
Admission: RE | Admit: 2021-04-07 | Discharge: 2021-04-07 | Disposition: A | Discharge status: Home / Self Care | Payer: Medicare Other | Source: Ambulatory Visit | Attending: Radiation Oncology | Admitting: Radiation Oncology

## 2021-04-07 DIAGNOSIS — Z510 Encounter for antineoplastic radiation therapy: Secondary | ICD-10-CM | POA: Diagnosis not present

## 2021-04-07 DIAGNOSIS — C61 Malignant neoplasm of prostate: Secondary | ICD-10-CM | POA: Diagnosis not present

## 2021-04-08 ENCOUNTER — Other Ambulatory Visit: Payer: Self-pay

## 2021-04-08 ENCOUNTER — Ambulatory Visit
Admission: RE | Admit: 2021-04-08 | Discharge: 2021-04-08 | Disposition: A | Discharge status: Home / Self Care | Payer: Medicare Other | Source: Ambulatory Visit | Attending: Radiation Oncology | Admitting: Radiation Oncology

## 2021-04-08 DIAGNOSIS — C61 Malignant neoplasm of prostate: Secondary | ICD-10-CM | POA: Diagnosis not present

## 2021-04-08 DIAGNOSIS — Z510 Encounter for antineoplastic radiation therapy: Secondary | ICD-10-CM | POA: Diagnosis not present

## 2021-04-11 ENCOUNTER — Ambulatory Visit
Admission: RE | Admit: 2021-04-11 | Discharge: 2021-04-11 | Disposition: A | Discharge status: Home / Self Care | Payer: Medicare Other | Source: Ambulatory Visit | Attending: Radiation Oncology | Admitting: Radiation Oncology

## 2021-04-11 ENCOUNTER — Other Ambulatory Visit: Payer: Self-pay

## 2021-04-11 ENCOUNTER — Ambulatory Visit (INDEPENDENT_AMBULATORY_CARE_PROVIDER_SITE_OTHER): Payer: Medicare Other | Admitting: Family Medicine

## 2021-04-11 VITALS — BP 132/68 | HR 76 | Temp 98.0°F | Resp 16 | Ht 73.0 in | Wt 310.0 lb

## 2021-04-11 DIAGNOSIS — I509 Heart failure, unspecified: Secondary | ICD-10-CM | POA: Diagnosis not present

## 2021-04-11 DIAGNOSIS — R739 Hyperglycemia, unspecified: Secondary | ICD-10-CM | POA: Diagnosis not present

## 2021-04-11 DIAGNOSIS — E785 Hyperlipidemia, unspecified: Secondary | ICD-10-CM

## 2021-04-11 DIAGNOSIS — C61 Malignant neoplasm of prostate: Secondary | ICD-10-CM

## 2021-04-11 DIAGNOSIS — Z510 Encounter for antineoplastic radiation therapy: Secondary | ICD-10-CM | POA: Diagnosis not present

## 2021-04-11 DIAGNOSIS — I10 Essential (primary) hypertension: Secondary | ICD-10-CM

## 2021-04-11 DIAGNOSIS — Z1159 Encounter for screening for other viral diseases: Secondary | ICD-10-CM | POA: Diagnosis not present

## 2021-04-11 DIAGNOSIS — F410 Panic disorder [episodic paroxysmal anxiety] without agoraphobia: Secondary | ICD-10-CM | POA: Diagnosis not present

## 2021-04-11 DIAGNOSIS — I2510 Atherosclerotic heart disease of native coronary artery without angina pectoris: Secondary | ICD-10-CM | POA: Diagnosis not present

## 2021-04-11 NOTE — Assessment & Plan Note (Signed)
Encourage heart healthy diet such as MIND or DASH diet, increase exercise, avoid trans fats, simple carbohydrates and processed foods, consider a krill or fish or flaxseed oil cap daily.  °

## 2021-04-11 NOTE — Progress Notes (Signed)
Subjective:   By signing my name below, I, Zite Okoli, attest that this documentation has been prepared under the direction and in the presence of [name], MD. 04/11/2021   Patient ID: David Parsons, male    DOB: Jun 12, 1941, 80 y.o.   MRN: [id number]  Chief Complaint  Patient presents with   Hypertension    Here for follow up, reports dizziness much better now.     HPI Patient is in today for an office visit.  During his visit, his blood pressure is 132/68.   He has started radiation to treat his prostate cancer and is tolerating the treatment well. He is doing well but experiences fatigue in the night. He has been drinking a lot of fluids.  At his last visit to his cardiologist, he experienced a bout of dizziness  and weakness after he pulled into the parking lot. After he got to the office, he was feeling better and was cleared when checked out. He mentions this has not occurred again.   His mood has been okay and he has a good support system. There has been no loss of appetite.   He will be finishing his treatment on 04/21/2021 and he sees Dr Tammi Klippel every Friday.  He is willing to get a Hepatitis C screening today. He has 3 Moderna Covid-19 vaccines at this time and is willing to get the 2nd booster vaccine after he gets the approval from his oncologist. He is not UTD on the tetanus vaccines. He is not UTD on the shingles vaccines.  Past Medical History:  Diagnosis Date   Aortic insufficiency     02/16/09 Chest CT:  The aortic root has a diameter measuring 4.6 cm, image 64 of the coronal series.  The ascending    thoracic aorta has a diameter of 3.8 cm, image 65 of the coronal series.  At the level of the aortic arch    the thoracic aorta has a maximum diameter of 3.2 cm.  The descending thoracic aorta has a maximum           diameter of 3.2 cm.  There is no evidence for aortic disse         Aortic root enlargement (HCC)    The aortic root has a diameter measuring 4.6  cm, image 64 of the coronal series.  The ascending  thoracic aorta has a diameter of 3.8 cm, image 65 of the coronal series.  At the level of the aortic arch   the thoracic aorta has a maximum diameter of 3.2 cm.  The descending thoracic aorta has a maximum   diameter of 3.2 cm.  There is no evidence for aortic dissection.        CAD (coronary artery disease)    Cardiomyopathy     01/2009 - Cath - nonobs. dzs.  EF 35%.   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  Biological Bentall Aortic Root Replacement using 25 mm Edwards Magna Ease pericardial tissue valve and 28 mm Vascutek Gelweave Valsalva conduit with reimplantation of left main and right coronary artery and CABG x1 using SVG to RCA   Sleep apnea    occasionally wears CPAP at night    Past Surgical History:  Procedure Laterality Date   ADRENALECTOMY     BENTALL PROCEDURE  12/28/2011   Procedure: BENTALL PROCEDURE;  Surgeon: Rexene Alberts, MD;  Location: [facility];  Service: Open Heart Surgery;  Laterality: N/A;   CARDIAC CATHETERIZATION     2013   CARDIAC VALVE REPLACEMENT  12/28/2011   Bentall with tissue valve   CHOLECYSTECTOMY     COLONOSCOPY  09/25/2011   COLONOSCOPY W/ BIOPSIES AND POLYPECTOMY  11/04/2008   colon polyps, diverticulosis    CORONARY ARTERY BYPASS GRAFT  12/28/2011   Procedure: CORONARY ARTERY BYPASS GRAFTING (CABG);  Surgeon: Rexene Alberts, MD;  Location: [facility];  Service: Open Heart Surgery;  Laterality:  N/A;  cabg x 1   LEFT AND RIGHT HEART CATHETERIZATION WITH CORONARY ANGIOGRAM N/A 11/22/2011   Procedure: LEFT AND RIGHT HEART CATHETERIZATION WITH CORONARY ANGIOGRAM;  Surgeon: Burnell Blanks, MD;  Location: [facility] CATH LAB;  Service: Cardiovascular;  Laterality: N/A;   MULTIPLE TOOTH EXTRACTIONS     PROSTATE BIOPSY Bilateral    TEE WITHOUT CARDIOVERSION  11/22/2011   Procedure: TRANSESOPHAGEAL ECHOCARDIOGRAM (TEE);  Surgeon: Larey Dresser, MD;  Location: [facility] ENDOSCOPY;  Service: Cardiovascular;  Laterality: N/A;   UPPER GASTROINTESTINAL ENDOSCOPY  11/04/2008   w/bx, H pylori gastritis   UPPER GASTROINTESTINAL ENDOSCOPY  09/25/2011    Family History  Problem Relation Age of Onset   Breast cancer Sister    Diabetes Sister    Colon cancer Brother 29       at least 93   Diabetes Brother    Cancer Brother        colon, prostate   Prostate cancer Brother    Diabetes Brother    Diabetes Sister    Cancer Sister        breast, colon, melanoma   Heart disease Sister    Hypertension Mother    Arthritis Mother    Kidney failure Sister    Colon cancer Sister    Breast cancer Sister    Kidney disease Sister        s/p transplant   Hypertension Sister    Diabetes Sister    Stroke Father    Hypertension Father     Social History   Socioeconomic History   Marital status: Married    Spouse name: [name]   Number of children: 0   Years of education: Not on file   Highest education level: Not on file  Occupational History    Employer: [employer]    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  He  lives a very sedentary lifestyle.  He is a  nonsmoker.  He denies alcohol consumption.    Social Determinants of Health   Financial Resource Strain: Low Risk    Difficulty of Paying Living Expenses: Not very hard  Food Insecurity: No Food Insecurity   Worried About Charity fundraiser in the Last Year: Never true   Arboriculturist in the Last Year: Never true  Transportation Needs: Not on file  Physical Activity: Inactive   Days of Exercise per Week: 0 days   Minutes of Exercise per Session: 0 min  Stress: Not on file  Social Connections: Socially Integrated   Frequency of Communication with Friends and Family: Three times a week   Frequency of Social Gatherings with Friends and Family: Three times a week   Attends Religious Services: More than 4 times per year   Active Member of Clubs or Organizations: Yes   Attends Music therapist: More than 4 times per year   Marital Status: Married  Human resources officer Violence: Not on file    Outpatient Medications Prior to Visit  Medication Sig Dispense Refill   amLODipine (NORVASC) 10 MG tablet TAKE 1 TABLET BY MOUTH EVERY DAY 90 tablet 1   aspirin EC 81 MG tablet Take 81 mg by mouth daily.     bimatoprost (LUMIGAN) 0.01 % SOLN Place 1 drop into both eyes at bedtime.     carvedilol (COREG) 25 MG tablet TAKE 1 TABLET TWICE A DAY WITH FOOD 180 tablet 1   Cholecalciferol (VITAMIN D3) 50 MCG (2000 UT) CAPS Take 1 capsule by mouth daily.     clobetasol ointment (TEMOVATE) 0.05 % apply to affected area twice a day UPON ITCHING AND IRRITATION(NOT TO FACE OR GROIN) (Patient not taking: No sig reported)  0   diazepam (VALIUM) 5 MG tablet TAKE 1/2-2 TABLETS BY MOUTH DAILY AS NEEDED FOR ANXIETY. 10 tablet 0   ferrous sulfate 325 (65 FE) MG tablet TAKE 1 TABLET BY MOUTH 2 TIMES DAILY WITH A MEAL. 180 tablet 0   furosemide (LASIX) 40 MG tablet Take 1 tablet (40 mg total) by mouth 2 (two)  times daily. 60 tablet 12   hydrocortisone 2.5 % cream Apply 1 application topically 2 (two) times daily.  0   KLOR-CON M20 20 MEQ tablet TAKE 3 TABLETS (60 MEQ TOTAL) BY MOUTH 3 (THREE) TIMES DAILY. 810 tablet 1   losartan (COZAAR) 100 MG tablet TAKE 1 TABLET BY MOUTH EVERY DAY 90 tablet 3   Multiple Vitamin (MULITIVITAMIN WITH MINERALS) TABS Take 1 tablet by mouth daily.     nystatin cream (MYCOSTATIN) Apply 1 application topically 2 (two) times daily. (Patient not taking: No sig reported) 30 g 1   omeprazole (PRILOSEC) 40 MG capsule TAKE 1 CAPSULE BY MOUTH EVERY DAY 90 capsule 1   tamsulosin (FLOMAX) 0.4 MG CAPS capsule Take 1 capsule (0.4 mg total) by mouth daily. 30 capsule 3   timolol (TIMOPTIC) 0.5 % ophthalmic solution INSTILL 1 DROP INTO BOTH EYES TWICE A DAY     triamterene-hydrochlorothiazide (MAXZIDE-25) 37.5-25 MG tablet TAKE 1 TABLET BY MOUTH EVERY DAY 90 tablet 0   No facility-administered medications prior to visit.    Allergies  Allergen Reactions   Latex Hives    Latex gloves     Review of Systems  Constitutional:  Positive for malaise/fatigue (in the night). Negative for fever.  HENT:  Negative for congestion.   Eyes:  Negative for redness.  Respiratory:  Negative for shortness of breath.   Cardiovascular:  Negative for chest pain, palpitations and leg swelling.  Gastrointestinal:  Negative for abdominal pain, blood in stool and nausea.  Genitourinary:  Negative for dysuria and frequency.  Musculoskeletal:  Negative for falls.  Skin:  Negative for rash.  Neurological:  Negative for dizziness, loss of consciousness and headaches.  Endo/Heme/Allergies:  Negative for polydipsia.  Psychiatric/Behavioral:  Negative for depression. The patient is not nervous/anxious.       Objective:    Physical Exam Constitutional:      Appearance: Normal appearance. He is not ill-appearing.  HENT:     Head: Normocephalic and atraumatic.     Right Ear: Tympanic membrane, ear  canal and external ear normal.     Left Ear: Tympanic membrane, ear canal and external ear normal.  Eyes:     Conjunctiva/sclera: Conjunctivae normal.  Cardiovascular:     Rate and Rhythm: Normal rate and regular rhythm.     Heart sounds: Normal heart sounds. No murmur heard. Pulmonary:     Breath sounds: Normal breath sounds. No wheezing.  Abdominal:     General: Bowel sounds are normal. There is no distension.     Palpations: Abdomen is soft.     Tenderness: There is no abdominal tenderness.     Hernia: No hernia is present.  Musculoskeletal:     Cervical back: Neck supple.  Lymphadenopathy:     Cervical: No cervical adenopathy.  Skin:    General: Skin is warm and dry.  Neurological:     Mental Status: He is alert and oriented to person, place, and time.  Psychiatric:        Behavior: Behavior normal.    BP 132/68   Pulse 76   Temp 98 F (36.7 C) (Oral)   Resp 16   Ht '6\' 1"'$  (1.854 m)   Wt (!) 310 lb (140.6 kg)   SpO2 99%   BMI 40.90 kg/m  Wt Readings from Last 3 Encounters:  04/11/21 (!) 310 lb (140.6 kg)  10/25/20 (!) 310 lb (140.6 kg)  05/10/20 (!) 317 lb (143.8 kg)    Diabetic Foot Exam - Simple   No data filed    Lab Results  Component Value Date   WBC 8.4 12/07/2020   HGB 13.9 12/07/2020   HCT 42.1 12/07/2020   PLT 284.0 12/07/2020   GLUCOSE 130 (H) 12/07/2020   CHOL 160 12/07/2020   TRIG 145.0 12/07/2020   HDL 36.10 (L) 12/07/2020   LDLCALC 95 12/07/2020   ALT 12 12/07/2020   AST 12 12/07/2020   NA 140 12/07/2020   K 3.7 12/07/2020   CL 100 12/07/2020   CREATININE 1.00 12/07/2020   BUN 12 12/07/2020   CO2 32 12/07/2020   TSH 1.79 12/07/2020   PSA 23.37 (H) 05/10/2020   INR 1.21 01/02/2012   HGBA1C 5.4 12/07/2020    Lab Results  Component Value Date   TSH 1.79 12/07/2020   Lab Results  Component Value Date   WBC 8.4 12/07/2020   HGB 13.9 12/07/2020   HCT 42.1 12/07/2020   MCV 88.5 12/07/2020   PLT 284.0 12/07/2020   Lab Results   Component Value Date   NA 140 12/07/2020   K 3.7 12/07/2020   CO2 32 12/07/2020   GLUCOSE 130 (H) 12/07/2020   BUN 12 12/07/2020   CREATININE 1.00 12/07/2020   BILITOT 1.4 (H) 12/07/2020   ALKPHOS 75 12/07/2020   AST 12 12/07/2020   ALT 12 12/07/2020   PROT 6.9 12/07/2020  ALBUMIN 3.9 12/07/2020   CALCIUM 9.9 12/07/2020   GFR 71.94 12/07/2020   Lab Results  Component Value Date   CHOL 160 12/07/2020   Lab Results  Component Value Date   HDL 36.10 (L) 12/07/2020   Lab Results  Component Value Date   LDLCALC 95 12/07/2020   Lab Results  Component Value Date   TRIG 145.0 12/07/2020   Lab Results  Component Value Date   CHOLHDL 4 12/07/2020   Lab Results  Component Value Date   HGBA1C 5.4 12/07/2020       Assessment & Plan:   Problem List Items Addressed This Visit     Essential hypertension    Well controlled, no changes to meds. Encouraged heart healthy diet such as the DASH diet and exercise as tolerated.       Relevant Orders   CBC   Comprehensive metabolic panel   TSH   CAD, NATIVE VESSEL    Following with cardiology Dr Julianne Handler, he had an episode of feeling dizzy at his office but it has not recurred and work up was negative      Congestive heart failure ([place])    No recent exacerbation      Hyperglycemia    hgba1c acceptable, minimize simple carbs. Increase exercise as tolerated.       Relevant Orders   Hemoglobin A1c   Dyslipidemia    Encourage heart healthy diet such as MIND or DASH diet, increase exercise, avoid trans fats, simple carbohydrates and processed foods, consider a krill or fish or flaxseed oil cap daily.       Relevant Orders   Lipid panel   Anxiety attack    He is managing his anxiety is doing well. He has not needed the Valium hardly at all. He had a few when his sister past in June 2021      Malignant neoplasm of prostate [facility])    He is following with dr Tammi Klippel of Oncology and [facility] and tolerating his  therapy well.      Other Visit Diagnoses     Encounter for hepatitis C screening test for low risk patient    -  Primary   Relevant Orders   Hepatitis C Antibody       No orders of the defined types were placed in this encounter.   I,Zite Okoli,acting as a Education administrator for [name], MD.,have documented all relevant documentation on the behalf of [name], MD,as directed by  [name], MD while in the presence of [name], MD.   I, [name], MD., personally preformed the services described in this documentation.  All medical record entries made by the scribe were at my direction and in my presence.  I have reviewed the chart and discharge instructions (if applicable) and agree that the record reflects my personal performance and is accurate and complete. 04/11/2021

## 2021-04-11 NOTE — Assessment & Plan Note (Signed)
Well controlled, no changes to meds. Encouraged heart healthy diet such as the DASH diet and exercise as tolerated.  °

## 2021-04-11 NOTE — Assessment & Plan Note (Signed)
He is managing his anxiety is doing well. He has not needed the Valium hardly at all. He had a few when his sister past in June 2021

## 2021-04-11 NOTE — Assessment & Plan Note (Signed)
Following with cardiology Dr Julianne Handler, he had an episode of feeling dizzy at his office but it has not recurred and work up was negative

## 2021-04-11 NOTE — Assessment & Plan Note (Signed)
No recent exacerbation 

## 2021-04-11 NOTE — Patient Instructions (Addendum)
Tdap at pharmacy  Second covid booster at pharmacy including [facility]  Shingrix is the new shingles shot, 2 shots over 2-6 months, confirm coverage with insurance and document, then can return here for shots with nurse appt or at pharmacy .  100-140/60-90 for BP   Paxlovid is the new COVID medication we can give you if you get COVID so make sure you test if you have symptoms because we have to treat by day 5 of symptoms for it to be effective. If you are positive let us know so we can treat. If a home test is negative and your symptoms are persistent get a PCR test. Can check testing locations at [email or web address] If you are positive we will make an appointment with [place] and we will send in Paxlovid if you would like it. Check with your pharmacy before we meet to confirm they have it in stock, if they do not then we can get the prescription at the [facility].

## 2021-04-11 NOTE — Assessment & Plan Note (Signed)
He is following with dr Tammi Klippel of Oncology and [facility] and tolerating his therapy well.

## 2021-04-11 NOTE — Assessment & Plan Note (Signed)
hgba1c acceptable, minimize simple carbs. Increase exercise as tolerated.  

## 2021-04-12 ENCOUNTER — Ambulatory Visit
Admission: RE | Admit: 2021-04-12 | Discharge: 2021-04-12 | Disposition: A | Discharge status: Home / Self Care | Payer: Medicare Other | Source: Ambulatory Visit | Attending: Radiation Oncology | Admitting: Radiation Oncology

## 2021-04-12 DIAGNOSIS — C61 Malignant neoplasm of prostate: Secondary | ICD-10-CM | POA: Diagnosis not present

## 2021-04-12 DIAGNOSIS — Z510 Encounter for antineoplastic radiation therapy: Secondary | ICD-10-CM | POA: Diagnosis not present

## 2021-04-12 LAB — CBC
HCT: 36.6 % — ABNORMAL LOW (ref 39.0–52.0)
Hemoglobin: 12.3 g/dL — ABNORMAL LOW (ref 13.0–17.0)
MCHC: 33.6 g/dL (ref 30.0–36.0)
MCV: 89.9 fl (ref 78.0–100.0)
Platelets: 190 10*3/uL (ref 150.0–400.0)
RBC: 4.07 Mil/uL — ABNORMAL LOW (ref 4.22–5.81)
RDW: 14.6 % (ref 11.5–15.5)
WBC: 5.6 10*3/uL (ref 4.0–10.5)

## 2021-04-12 LAB — HEMOGLOBIN A1C: Hgb A1c MFr Bld: 5.4 % (ref 4.6–6.5)

## 2021-04-12 LAB — HEPATITIS C ANTIBODY
Hepatitis C Ab: NONREACTIVE
SIGNAL TO CUT-OFF: 0.01 (ref <1.00)

## 2021-04-12 LAB — TSH: TSH: 1.83 u[IU]/mL (ref 0.35–5.50)

## 2021-04-13 ENCOUNTER — Ambulatory Visit
Admission: RE | Admit: 2021-04-13 | Discharge: 2021-04-13 | Disposition: A | Discharge status: Home / Self Care | Payer: Medicare Other | Source: Ambulatory Visit | Attending: Radiation Oncology | Admitting: Radiation Oncology

## 2021-04-13 ENCOUNTER — Other Ambulatory Visit: Payer: Self-pay

## 2021-04-13 DIAGNOSIS — Z510 Encounter for antineoplastic radiation therapy: Secondary | ICD-10-CM | POA: Diagnosis not present

## 2021-04-13 DIAGNOSIS — C61 Malignant neoplasm of prostate: Secondary | ICD-10-CM | POA: Diagnosis not present

## 2021-04-14 ENCOUNTER — Ambulatory Visit
Admission: RE | Admit: 2021-04-14 | Discharge: 2021-04-14 | Disposition: A | Discharge status: Home / Self Care | Payer: Medicare Other | Source: Ambulatory Visit | Attending: Radiation Oncology | Admitting: Radiation Oncology

## 2021-04-14 ENCOUNTER — Telehealth: Payer: Self-pay

## 2021-04-14 DIAGNOSIS — Z510 Encounter for antineoplastic radiation therapy: Secondary | ICD-10-CM | POA: Diagnosis not present

## 2021-04-14 DIAGNOSIS — C61 Malignant neoplasm of prostate: Secondary | ICD-10-CM | POA: Diagnosis not present

## 2021-04-14 NOTE — Telephone Encounter (Signed)
Left message for patient to return call to lab

## 2021-04-14 NOTE — Telephone Encounter (Signed)
Patient called back,lab was unable to figure out why he was called.  He was told he would get a call back from [place] on 08/26.

## 2021-04-14 NOTE — Addendum Note (Signed)
Addended by: Manuela Schwartz on: 04/14/2021 08:11 AM   Modules accepted: Orders

## 2021-04-15 ENCOUNTER — Ambulatory Visit: Payer: Medicare Other | Admitting: Podiatry

## 2021-04-15 ENCOUNTER — Ambulatory Visit
Admission: RE | Admit: 2021-04-15 | Discharge: 2021-04-15 | Disposition: A | Discharge status: Home / Self Care | Payer: Medicare Other | Source: Ambulatory Visit | Attending: Radiation Oncology | Admitting: Radiation Oncology

## 2021-04-15 ENCOUNTER — Telehealth: Payer: Self-pay

## 2021-04-15 ENCOUNTER — Other Ambulatory Visit: Payer: Self-pay

## 2021-04-15 DIAGNOSIS — Z510 Encounter for antineoplastic radiation therapy: Secondary | ICD-10-CM | POA: Diagnosis not present

## 2021-04-15 DIAGNOSIS — C61 Malignant neoplasm of prostate: Secondary | ICD-10-CM | POA: Diagnosis not present

## 2021-04-15 NOTE — Telephone Encounter (Signed)
Left message for patient to return call and schedule a lab appointment for BMP orders are in epic for future.

## 2021-04-18 ENCOUNTER — Ambulatory Visit
Admission: RE | Admit: 2021-04-18 | Discharge: 2021-04-18 | Disposition: A | Discharge status: Home / Self Care | Payer: Medicare Other | Source: Ambulatory Visit | Attending: Radiation Oncology | Admitting: Radiation Oncology

## 2021-04-18 ENCOUNTER — Other Ambulatory Visit (INDEPENDENT_AMBULATORY_CARE_PROVIDER_SITE_OTHER): Payer: Medicare Other

## 2021-04-18 ENCOUNTER — Other Ambulatory Visit: Payer: Self-pay

## 2021-04-18 DIAGNOSIS — Z510 Encounter for antineoplastic radiation therapy: Secondary | ICD-10-CM | POA: Diagnosis not present

## 2021-04-18 DIAGNOSIS — I10 Essential (primary) hypertension: Secondary | ICD-10-CM | POA: Diagnosis not present

## 2021-04-18 DIAGNOSIS — C61 Malignant neoplasm of prostate: Secondary | ICD-10-CM | POA: Diagnosis not present

## 2021-04-18 LAB — COMPREHENSIVE METABOLIC PANEL
ALT: 11 U/L (ref 0–53)
AST: 15 U/L (ref 0–37)
Albumin: 3.6 g/dL (ref 3.5–5.2)
Alkaline Phosphatase: 58 U/L (ref 39–117)
BUN: 11 mg/dL (ref 6–23)
CO2: 25 mEq/L (ref 19–32)
Calcium: 9.1 mg/dL (ref 8.4–10.5)
Chloride: 104 mEq/L (ref 96–112)
Creatinine, Ser: 1 mg/dL (ref 0.40–1.50)
GFR: 71.76 mL/min (ref >60.00)
Glucose, Bld: 119 mg/dL — ABNORMAL HIGH (ref 70–99)
Potassium: 3.8 mEq/L (ref 3.5–5.1)
Sodium: 139 mEq/L (ref 135–145)
Total Bilirubin: 1.2 mg/dL (ref 0.2–1.2)
Total Protein: 6.2 g/dL (ref 6.0–8.3)

## 2021-04-19 ENCOUNTER — Ambulatory Visit
Admission: RE | Admit: 2021-04-19 | Discharge: 2021-04-19 | Disposition: A | Discharge status: Home / Self Care | Payer: Medicare Other | Source: Ambulatory Visit | Attending: Radiation Oncology | Admitting: Radiation Oncology

## 2021-04-19 DIAGNOSIS — C61 Malignant neoplasm of prostate: Secondary | ICD-10-CM | POA: Diagnosis not present

## 2021-04-19 DIAGNOSIS — Z510 Encounter for antineoplastic radiation therapy: Secondary | ICD-10-CM | POA: Diagnosis not present

## 2021-04-20 ENCOUNTER — Ambulatory Visit
Admission: RE | Admit: 2021-04-20 | Discharge: 2021-04-20 | Disposition: A | Discharge status: Home / Self Care | Payer: Medicare Other | Source: Ambulatory Visit | Attending: Radiation Oncology | Admitting: Radiation Oncology

## 2021-04-20 ENCOUNTER — Other Ambulatory Visit: Payer: Self-pay

## 2021-04-20 DIAGNOSIS — Z510 Encounter for antineoplastic radiation therapy: Secondary | ICD-10-CM | POA: Diagnosis not present

## 2021-04-20 DIAGNOSIS — C61 Malignant neoplasm of prostate: Secondary | ICD-10-CM | POA: Diagnosis not present

## 2021-04-21 ENCOUNTER — Encounter: Payer: Self-pay | Admitting: Radiation Oncology

## 2021-04-21 ENCOUNTER — Ambulatory Visit
Admission: RE | Admit: 2021-04-21 | Discharge: 2021-04-21 | Disposition: A | Discharge status: Home / Self Care | Payer: Medicare Other | Source: Ambulatory Visit | Attending: Radiation Oncology | Admitting: Radiation Oncology

## 2021-04-21 DIAGNOSIS — C61 Malignant neoplasm of prostate: Secondary | ICD-10-CM | POA: Diagnosis not present

## 2021-04-21 DIAGNOSIS — Z510 Encounter for antineoplastic radiation therapy: Secondary | ICD-10-CM | POA: Insufficient documentation

## 2021-04-28 ENCOUNTER — Telehealth: Payer: Self-pay

## 2021-04-28 NOTE — Telephone Encounter (Signed)
Called patient to discuss his skin. David Parsons talked with him yesterday and gave skin care instructions.

## 2021-05-09 ENCOUNTER — Telehealth: Payer: Self-pay

## 2021-05-09 NOTE — Telephone Encounter (Signed)
David Parsons wife called wanting to know if it was alright to get his Flu vaccination which was schedule for tomorrow morning 05/10/2021.  Returned called to David Parsons explaining that it was ok to get the vaccination.

## 2021-05-10 ENCOUNTER — Other Ambulatory Visit: Payer: Self-pay

## 2021-05-10 ENCOUNTER — Ambulatory Visit (INDEPENDENT_AMBULATORY_CARE_PROVIDER_SITE_OTHER): Payer: Medicare Other

## 2021-05-10 DIAGNOSIS — Z23 Encounter for immunization: Secondary | ICD-10-CM

## 2021-05-19 DIAGNOSIS — H401132 Primary open-angle glaucoma, bilateral, moderate stage: Secondary | ICD-10-CM | POA: Diagnosis not present

## 2021-05-19 DIAGNOSIS — H35371 Puckering of macula, right eye: Secondary | ICD-10-CM | POA: Diagnosis not present

## 2021-05-19 DIAGNOSIS — H04123 Dry eye syndrome of bilateral lacrimal glands: Secondary | ICD-10-CM | POA: Diagnosis not present

## 2021-05-19 DIAGNOSIS — H25813 Combined forms of age-related cataract, bilateral: Secondary | ICD-10-CM | POA: Diagnosis not present

## 2021-05-26 ENCOUNTER — Telehealth: Payer: Self-pay

## 2021-05-26 NOTE — Telephone Encounter (Signed)
Return call to Mrs. Finklea with concerns of treatment for radiation burns.  Currently using aloe, neosporin, and aqua-phor.  Stated the area in question skin was peeling and itchy.  Mrs. Brosky was advised to use OTC hydrocortisone cream for the itching and that peeling skin was to be expected.  Monitor for any other issues and call back if needed.  No further concerns.

## 2021-06-06 ENCOUNTER — Other Ambulatory Visit: Payer: Self-pay

## 2021-06-06 ENCOUNTER — Ambulatory Visit (INDEPENDENT_AMBULATORY_CARE_PROVIDER_SITE_OTHER): Payer: Medicare Other | Admitting: Podiatry

## 2021-06-06 ENCOUNTER — Encounter: Payer: Self-pay | Admitting: Urology

## 2021-06-06 ENCOUNTER — Encounter: Payer: Self-pay | Admitting: Podiatry

## 2021-06-06 DIAGNOSIS — B351 Tinea unguium: Secondary | ICD-10-CM

## 2021-06-06 DIAGNOSIS — M79675 Pain in left toe(s): Secondary | ICD-10-CM | POA: Diagnosis not present

## 2021-06-06 DIAGNOSIS — M79674 Pain in right toe(s): Secondary | ICD-10-CM | POA: Diagnosis not present

## 2021-06-06 NOTE — Progress Notes (Addendum)
Patient's spouse Edgar Reisz), who is cleared for me to speak with states patient doing well. No symptoms reported at this time.  Flomax 0.4 mg taken as directed. No current urology follow-up scheduled- per [facility].  I-PSS Score of 2 (mild).  Meaningful use complete.  Patient's spouse notified of 9:30-10/19/22 telephone appointment and verbalized understanding.

## 2021-06-07 NOTE — Progress Notes (Signed)
  Radiation Oncology         [phone] ________________________________  Name: David Parsons MRN: [id number]  Date: 04/21/2021  DOB: 17-Jun-1941  End of Treatment Note  Diagnosis:   80 y.o. gentleman with Stage T1c adenocarcinoma of the prostate with Gleason score of 4+3, and PSA of 13.40.     Indication for treatment:  Curative, Definitive Radiotherapy       Radiation treatment dates:   7/26-04/21/21  Site/dose:   The prostate was treated to 70 Gy in 28 fractions of 2.5 Gy  Beams/energy:   The patient was treated with IMRT using volumetric arc therapy delivering 6 MV X-rays to clockwise and counterclockwise circumferential arcs with a 90 degree collimator offset to avoid dose scalloping.  Image guidance was performed with daily cone beam CT prior to each fraction to align to gold markers in the prostate and assure proper bladder and rectal fill volumes.  Immobilization was achieved with BodyFix custom mold.  Narrative: The patient tolerated radiation treatment relatively well.   The patient experienced some minor urinary irritation and modest fatigue.    Plan: The patient has completed radiation treatment. He will return to radiation oncology clinic for routine followup in one month. I advised him to call or return sooner if he has any questions or concerns related to his recovery or treatment. ________________________________  Sheral Apley. Tammi Klippel, M.D.

## 2021-06-08 ENCOUNTER — Ambulatory Visit
Admission: RE | Admit: 2021-06-08 | Discharge: 2021-06-08 | Disposition: A | Discharge status: Home / Self Care | Payer: Medicare Other | Source: Ambulatory Visit | Attending: Urology | Admitting: Urology

## 2021-06-08 ENCOUNTER — Other Ambulatory Visit: Payer: Self-pay | Admitting: Urology

## 2021-06-08 DIAGNOSIS — C61 Malignant neoplasm of prostate: Secondary | ICD-10-CM

## 2021-06-08 NOTE — Progress Notes (Signed)
Radiation Oncology         [phone] ________________________________  Name: David Parsons MRN: [id number]  Date: 06/08/2021  DOB: 1941-08-20  Post Treatment Note  CC: [name], MD  Franchot Gallo, MD  Diagnosis:   80 y.o. gentleman with Stage T1c adenocarcinoma of the prostate with Gleason score of 4+3, and PSA of 13.40.    Interval Since Last Radiation:  7 weeks   7/26/220- 04/21/21:  The prostate was treated to 70 Gy in 28 fractions of 2.5 Gy  Narrative:  The patient returns today for routine follow-up.  He tolerated radiation treatment relatively well with only minor urinary irritation and modest fatigue.   He did report nocturia x4 with occasional dysuria at the end of his stream and mild hyperpigmentation in the treatment field. He continued taking Flomax daily as prescribed and reported very mild loose stools on occasion but did not require medication.                       On review of systems, the patient states that he is doing very well in general.  He reports complete resolution of his LUTS and is back to his baseline with nocturia x2 per night.  He specifically denies any gross hematuria, dysuria, excessive daytime frequency, urgency, incomplete bladder emptying or incontinence.  He reports a healthy appetite and is maintaining his weight.  He denies abdominal pain, nausea, vomiting, diarrhea or constipation.  He has not noticed any significant impact on his energy level and has remained quite active.  Overall, he is very pleased with his progress to date.  ALLERGIES:  is allergic to latex.  Meds: Current Outpatient Medications  Medication Sig Dispense Refill   amLODipine (NORVASC) 10 MG tablet TAKE 1 TABLET BY MOUTH EVERY DAY 90 tablet 1   aspirin EC 81 MG tablet Take 81 mg by mouth daily.     bimatoprost (LUMIGAN) 0.01 % SOLN Place 1 drop into both eyes at bedtime.     carvedilol (COREG) 25 MG tablet TAKE 1 TABLET TWICE A DAY WITH FOOD 180 tablet 1    Cholecalciferol (VITAMIN D3) 50 MCG (2000 UT) CAPS Take 1 capsule by mouth daily.     diazepam (VALIUM) 5 MG tablet TAKE 1/2-2 TABLETS BY MOUTH DAILY AS NEEDED FOR ANXIETY. 10 tablet 0   ferrous sulfate 325 (65 FE) MG tablet TAKE 1 TABLET BY MOUTH 2 TIMES DAILY WITH A MEAL. 180 tablet 0   furosemide (LASIX) 40 MG tablet Take 1 tablet (40 mg total) by mouth 2 (two) times daily. 60 tablet 12   hydrocortisone 2.5 % cream Apply 1 application topically 2 (two) times daily.  0   KLOR-CON M20 20 MEQ tablet TAKE 3 TABLETS (60 MEQ TOTAL) BY MOUTH 3 (THREE) TIMES DAILY. 810 tablet 1   losartan (COZAAR) 100 MG tablet TAKE 1 TABLET BY MOUTH EVERY DAY 90 tablet 3   Multiple Vitamin (MULITIVITAMIN WITH MINERALS) TABS Take 1 tablet by mouth daily.     omeprazole (PRILOSEC) 40 MG capsule TAKE 1 CAPSULE BY MOUTH EVERY DAY 90 capsule 1   tamsulosin (FLOMAX) 0.4 MG CAPS capsule Take 1 capsule (0.4 mg total) by mouth daily. 30 capsule 3   timolol (TIMOPTIC) 0.5 % ophthalmic solution INSTILL 1 DROP INTO BOTH EYES TWICE A DAY     triamterene-hydrochlorothiazide (MAXZIDE-25) 37.5-25 MG tablet TAKE 1 TABLET BY MOUTH EVERY DAY 90 tablet 0   clobetasol ointment (TEMOVATE) 0.05 %  apply to affected area twice a day UPON ITCHING AND IRRITATION(NOT TO FACE OR GROIN) (Patient not taking: No sig reported)  0   nystatin cream (MYCOSTATIN) Apply 1 application topically 2 (two) times daily. (Patient not taking: No sig reported) 30 g 1   sulfamethoxazole-trimethoprim (BACTRIM DS) 800-160 MG tablet      No current facility-administered medications for this encounter.    Physical Findings:  vitals were not taken for this visit.  Pain Assessment Pain Score: 0-No pain/10 Unable to assess due to telephone follow-up visit format.  Lab Findings: Lab Results  Component Value Date   WBC 5.6 04/11/2021   HGB 12.3 (L) 04/11/2021   HCT 36.6 (L) 04/11/2021   MCV 89.9 04/11/2021   PLT 190.0 04/11/2021     Radiographic  Findings: No results found.  Impression/Plan: 1. 80 y.o. gentleman with Stage T1c adenocarcinoma of the prostate with Gleason score of 4+3, and PSA of 13.40.    He will continue to follow up with urology for ongoing PSA determinations but does not currently have any follow-up appointments scheduled with Dr. Diona Fanti to his knowledge. He understands what to expect with regards to PSA monitoring going forward and we discussed that typically, the first posttreatment PSA will be checked around 3 months after completion of treatment which would be in December 2022. I will look forward to following his response to treatment via correspondence with urology, and would be happy to continue to participate in his care if clinically indicated. I talked to the patient about what to expect in the future, including his risk for erectile dysfunction and rectal bleeding. I encouraged him to call or return to the office if he has any questions regarding his previous radiation or possible radiation side effects. He was comfortable with this plan and will follow up as needed.     Nicholos Johns, PA-C

## 2021-06-09 ENCOUNTER — Other Ambulatory Visit: Payer: Self-pay | Admitting: Family Medicine

## 2021-06-11 NOTE — Progress Notes (Signed)
Subjective: David Parsons is a 80 y.o. male patient seen today for follow up of  painful thick toenails that are difficult to trim. Pain interferes with ambulation. Aggravating factors include wearing enclosed shoe gear. Pain is relieved with periodic professional debridement.  New problems reported today: None.  He states he has been very busy with appointments for treatment for prostate cancer. He has completed radiation. Patient states he feels pretty good now.  PCP is [name], MD. Last visit was: 03/22/2021.  Allergies  Allergen Reactions   Latex Hives    Latex gloves     PCP is [name], MD .  Objective: Physical Exam  General: Patient is a pleasant 80 y.o. African American male morbidly obese in NAD. AAO x 3.   Neurovascular Examination: Capillary refill time to digits immediate b/l lower extremities. Palpable DP pulse(s) b/l lower extremities Palpable PT pulse(s) b/l lower extremities Pedal hair absent. Lower extremity skin temperature gradient within normal limits. No pain with calf compression b/l. No edema noted b/l lower extremities.  Protective sensation intact 5/5 intact bilaterally with 10g monofilament b/l. Vibratory sensation intact b/l.  Dermatological:  Skin warm and supple b/l lower extremities. No open wounds b/l LE. No interdigital macerations b/l lower extremities. Toenails 1-5 b/l elongated, discolored, dystrophic, thickened, crumbly with subungual debris and tenderness to dorsal palpation. Hyperkeratotic lesion(s) submet head 5 b/l.  No erythema, no edema, no drainage, no fluctuance.  Musculoskeletal:  Normal muscle strength 5/5 to all lower extremity muscle groups bilaterally. Hammertoe(s) noted to the 2-5 bilaterally. 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  Assessment: 1. Pain due to onychomycosis of toenails of both feet    Plan: Patient was evaluated and treated and all questions answered. Consent given for treatment as  described below: -Examined patient. -Medicare ABN signed for this year. Patient refuses services of paring of calluses  today. Copy has been placed in patient's chart. -Patient to continue soft, supportive shoe gear daily. -Toenails 1-5 bilaterally were debrided in length and girth with sterile nail nippers and dremel. Pinpoint bleeding of L 4th toe addressed with Lumicain Hemostatic Solution, cleansed with alcohol. triple antibiotic ointment applied. Patient instructed to apply triple antibiotic ointment once daily for 7 days. -Patient to report any pedal injuries to medical professional immediately. -Patient/POA to call should there be question/concern in the interim.  Return in about 3 months (around 09/06/2021).  Marzetta Board, DPM

## 2021-06-30 ENCOUNTER — Ambulatory Visit (INDEPENDENT_AMBULATORY_CARE_PROVIDER_SITE_OTHER): Payer: Medicare Other | Admitting: Pharmacist

## 2021-06-30 DIAGNOSIS — E785 Hyperlipidemia, unspecified: Secondary | ICD-10-CM

## 2021-06-30 DIAGNOSIS — I10 Essential (primary) hypertension: Secondary | ICD-10-CM

## 2021-06-30 DIAGNOSIS — I509 Heart failure, unspecified: Secondary | ICD-10-CM

## 2021-06-30 DIAGNOSIS — I2510 Atherosclerotic heart disease of native coronary artery without angina pectoris: Secondary | ICD-10-CM

## 2021-06-30 NOTE — Patient Instructions (Addendum)
Visit Information  Hypertension / Thoracic aneurysm / Heart Failure: BP Readings from Last 3 Encounters:  04/11/21 132/68  10/25/20 114/86  06/17/20 (!) 161/92   Pharmacist Clinical Goal(s): Over the next 90 days, patient will work with PharmD and providers to maintain BP goal <130/80 Current regimen:  Carvedilol 25mg  twice daily  Triamterene/hctz 37.5/25mg  daily Amlodipine 10mg  daily Losartan 100mg  daily Furosemide 40mg  twice daily Interventions: Discussed blood pressure goal Recommended check blood pressure at home 2 to 3 times per week and record Patient self care activities - Over the next 90 days, patient will: Check blood pressure 2 to 3 times week, document, and provide at future appointments Ensure daily salt intake < 2300 mg/day Continue current regimen for blood pressure control  Hyperlipidemia/ heart disease Lab Results  Component Value Date/Time   LDLCALC 95 12/07/2020 09:28 AM   Pharmacist Clinical Goal(s): Over the next 90 days, patient will work with PharmD and providers to achieve LDL goal < 70 Current regimen:  Aspirin 81mg  daily Interventions: Discussed LDL goal Considering statin therapy - will hold off until after upcoming radiation therpay Patient self care activities - Over the next 90 days, patient will: Consider adding statin after completed radiation therapy Patient is due to have lipids rechecked - reminded to have labs drawn Continue to avoid sodas and high fat foods;   BPH / Prostate cancer Pharmacist Clinical Goal(s) Over the next 90 days, patient will work with PharmD and providers to reduce symptoms associated with BPH Current regimen:  Tamsulosin 0.4mg  daily  Patient self care activities - Over the next 90 days, patient will: Continue current therapy Continue to follow up with Dr Tammi Klippel and urologist - due to have labs 07/2021  GERD / acid reflux  Pharmacist Clinical Goal(s) Over the next 90 days, patient will work with PharmD and  providers to prevent acid reflux symptoms and assess if omeprazole is still needed.  Current therapy:  Omeprazole 40mg  daily   Patient self care activities - Over the next 90 days, patient will:  Continue to take omeprazole 40mg  daily.    Health Maintenance  Pharmacist Clinical Goal(s) Over the next 90 days, patient will work with PharmD and providers to complete health maintenance screenings/vaccinations Interventions: Recommend patient receive Tetanus booster in 2023 Recommend patient receive Shingrix in 2023 Patient self care activities - Over the next 90 days, patient will: Plan to get Tetanus booster and Shingrix in 2023 Plans to get COVID Booster this month. Provided information for hours of operation for [place] vaccination clinic Recommend starting walking program for exercise after completing radiation. Exercise can help keep blood glucose, blood pressure and cholesterol down. Goal it to get at least 150 minutes of physical activity per week.   Medication management Pharmacist Clinical Goal(s): Over the next 90 days, patient will work with PharmD and providers to maintain optimal medication adherence Current pharmacy: [facility] Comprehensive medication review performed and medication list updated.  Continue current medication management strategy Patient self care activities - Over the next 90 days, patient will: Focus on medication adherence by filling and taking medications appropriately  Take medications as prescribed Report any questions or concerns to PharmD and/or provider(s)  Patient verbalizes understanding of instructions provided today and agrees to view in [place].   Telephone follow up appointment with care management team member scheduled for: 3 months  [name], PharmD Clinical Pharmacist [facility]

## 2021-06-30 NOTE — Chronic Care Management (AMB) (Signed)
Chronic Care Management Pharmacy Note  06/30/2021 Name:  TAEVEON KEESLING MRN:  [id number] DOB:  [birth year]   Subjective: David Parsons is an 80 y.o. year old male who is a primary patient of [name], MD.  The CCM team was consulted for assistance with disease management and care coordination needs.    Engaged with patient by telephone for follow up visit in response to provider referral for pharmacy case management and/or care coordination services.   Consent to Services:  The patient was given information about Chronic Care Management services, agreed to services, and gave verbal consent prior to initiation of services.  Please see initial visit note for detailed documentation.   Patient Care Team: [name], MD as PCP - General (Family Medicine) Burnell Blanks, MD as Consulting Physician (Cardiology) Gatha Mayer, MD as Consulting Physician (Gastroenterology) Druscilla Brownie, MD as Consulting Physician (Dermatology) Calvert Cantor, MD as Consulting Physician (Ophthalmology) Carolan Clines, MD (Inactive) as Consulting Physician (Urology) Gardiner Barefoot, DPM as Consulting Physician (Podiatry) [name], RPH-CPP (Pharmacist)  Recent office visits: 04/11/2021 - Fam Med ([name]) F/U HTN. Reported dizziness improved.  11/23/2020 - PCP ([name]) Video visit to f/u chronic medical concerns. Labs ordered - A1c, CBC, lipids, CMP and TSH for future. No medication changes.  Recent consult visits: 06/08/2021 - Rad Onc (Bruning, PAC) F/U post radiation for prostate cancer. continue to follow up with urology for ongoing PSA determinations - due 07/2021. 06/06/2021 - Podiatry (Dr Elisha Ponder) Nail care for pain due to onychomycosis. Declined paring of calluses today. Toenails debrided and clipped.  04/21/2021 - Rad Onc (Dr Tammi Klippel) Seen for f/u prostate cancer. has completed radiation therapy. Experiencee some minor urinary irriation and modest fatigue.   02/03/2021 - Rad Onc (Dr Tammi Klippel) Phone Visit - Consult for possible radiation.  Stage T1c adenocarcinoma of the prostate with Gleason score of 4+3, and PSA of 13.40. At the end of the conversation the patient is interested in moving forward with 5.5- 8 weeks of external beam therapy.  He is leaning towards a 5-1/2-week course but giving consideration to the 8-week course to reduce LUTS associated with the radiation treatments.   Hospital visits: None in previous 6 months  Objective:  Lab Results  Component Value Date   CREATININE 1.00 04/18/2021   CREATININE 1.00 12/07/2020   CREATININE 0.99 10/28/2019    Lab Results  Component Value Date   HGBA1C 5.4 04/11/2021   Last diabetic Eye exam: No results found for: HMDIABEYEEXA  Last diabetic Foot exam: No results found for: HMDIABFOOTEX      Component Value Date/Time   CHOL 160 12/07/2020 0928   TRIG 145.0 12/07/2020 0928   HDL 36.10 (L) 12/07/2020 0928   CHOLHDL 4 12/07/2020 0928   VLDL 29.0 12/07/2020 0928   LDLCALC 95 12/07/2020 0928    Hepatic Function Latest Ref Rng & Units 04/18/2021 12/07/2020 10/28/2019  Total Protein 6.0 - 8.3 g/dL 6.2 6.9 6.9  Albumin 3.5 - 5.2 g/dL 3.6 3.9 3.9  AST 0 - 37 U/L _0 ALT 0 - 53 U/L _1 Alk Phosphatase 39 - 117 U/L 58 75 72  Total Bilirubin 0.2 - 1.2 mg/dL 1.2 1.4(H) 1.0  Bilirubin, Direct 0.0 - 0.3 mg/dL - - -    Lab Results  Component Value Date/Time   TSH 1.83 04/11/2021 02:39 PM   TSH 1.79 12/07/2020 09:28 AM    CBC Latest Ref Rng & Units  04/11/2021 12/07/2020 05/10/2020  WBC 4.0 - 10.5 K/uL 5.6 8.4 10.0  Hemoglobin 13.0 - 17.0 g/dL 12.3(L) 13.9 13.1(L)  Hematocrit 39.0 - 52.0 % 36.6(L) 42.1 40.3  Platelets 150.0 - 400.0 K/uL 190.0 284.0 454(H)    No results found for: VD25OH  Clinical ASCVD: Yes  The 10-year ASCVD risk score (Arnett DK, et al., 2019) is: 44.7%   Values used to calculate the score:     Age: 75 years     Sex: Male     Is Non-Hispanic African  American: Yes     Diabetic: Yes     Tobacco smoker: No     Systolic Blood Pressure: 161 mmHg     Is BP treated: Yes     HDL Cholesterol: 36.1 mg/dL     Total Cholesterol: 160 mg/dL     Social History   Tobacco Use  Smoking Status Never  Smokeless Tobacco Never   BP Readings from Last 3 Encounters:  04/11/21 132/68  10/25/20 114/86  06/17/20 (!) 161/92   Pulse Readings from Last 3 Encounters:  04/11/21 76  10/25/20 66  06/17/20 86   Wt Readings from Last 3 Encounters:  04/11/21 (!) 310 lb (140.6 kg)  10/25/20 (!) 310 lb (140.6 kg)  05/10/20 (!) 317 lb (143.8 kg)    Assessment: Review of patient past medical history, allergies, medications, health status, including review of consultants reports, laboratory and other test data, was performed as part of comprehensive evaluation and provision of chronic care management services.   SDOH:  (Social Determinants of Health) assessments and interventions performed:  SDOH Interventions    Flowsheet Row Most Recent Value  SDOH Interventions   Transportation Interventions Intervention Not Indicated        CCM Care Plan  Allergies  Allergen Reactions   Latex Hives    Latex gloves     Medications Reviewed Today     Reviewed by [name], RPH-CPP (Pharmacist) on 06/30/21 at 1058  Med List Status: <None>   Medication Order Taking? Sig Documenting Provider Last Dose Status Informant  amLODipine (NORVASC) 10 MG tablet [id number] Yes TAKE 1 TABLET BY MOUTH EVERY DAY [name], MD Taking Active   aspirin EC 81 MG tablet [id number] Yes Take 81 mg by mouth daily. [provider] Taking Active   bimatoprost (LUMIGAN) 0.01 % SOLN [id number] Yes Place 1 drop into both eyes at bedtime. [provider] Taking Active   carvedilol (COREG) 25 MG tablet [id number] Yes TAKE 1 TABLET TWICE A DAY WITH FOOD [name], MD Taking Active   Cholecalciferol (VITAMIN D3) 50 MCG (2000 UT) CAPS [id number] Yes Take 1  capsule by mouth daily. [provider] Taking Active   clobetasol ointment (TEMOVATE) 0.05 % [id number] No apply to affected area twice a day UPON ITCHING AND IRRITATION(NOT TO FACE OR GROIN)  Patient not taking: No sig reported   [provider] Not Taking Active            Med Note (CANTER, Vilma Prader D   Fri Aug 31, 2017 10:23 AM)    diazepam (VALIUM) 5 MG tablet [id number] No TAKE 1/2-2 TABLETS BY MOUTH DAILY AS NEEDED FOR ANXIETY.  Patient not taking: Reported on 06/30/2021   Copland, Gay Filler, MD Not Taking Active   ferrous sulfate 325 (65 FE) MG tablet [id number] Yes TAKE 1 TABLET BY MOUTH 2 TIMES DAILY WITH A MEAL. [name], MD Taking Active   furosemide (LASIX) 40 MG tablet  [id number] Yes Take 1 tablet (40 mg total) by mouth 2 (two) times daily. Burnell Blanks, MD Taking Active   hydrocortisone 2.5 % cream [id number] Yes Apply 1 application topically 2 (two) times daily. [provider] Taking Active            Med Note Harrel Lemon, Jaanai Salemi T   Thu Sep 14, 2016  8:16 AM)    KLOR-CON M20 20 MEQ tablet [id number] Yes TAKE 3 TABLETS (60 MEQ TOTAL) BY MOUTH 3 (THREE) TIMES DAILY. [name], MD Taking Active   losartan (COZAAR) 100 MG tablet [id number] Yes TAKE 1 TABLET BY MOUTH EVERY DAY Burnell Blanks, MD Taking Active   Multiple Vitamin (MULITIVITAMIN WITH MINERALS) TABS [id number] Yes Take 1 tablet by mouth daily. [provider] Taking Active Spouse/Significant Other  nystatin cream (MYCOSTATIN) [id number] No Apply 1 application topically 2 (two) times daily.  Patient not taking: No sig reported   Mackie Pai, [place] Not Taking Active   omeprazole (PRILOSEC) 40 MG capsule [id number] Yes TAKE 1 CAPSULE BY MOUTH EVERY DAY [name], MD Taking Active   tamsulosin (FLOMAX) 0.4 MG CAPS capsule [id number] Yes Take 1 capsule (0.4 mg total) by mouth daily. Bruning, Ashlyn, PA-C Taking Active   timolol (TIMOPTIC) 0.5 % ophthalmic solution  [id number] Yes INSTILL 1 DROP INTO BOTH EYES TWICE A DAY [provider] Taking Active   triamterene-hydrochlorothiazide (MAXZIDE-25) 37.5-25 MG tablet [id number] Yes TAKE 1 TABLET BY MOUTH EVERY DAY [name], MD Taking Active             Patient Active Problem List   Diagnosis Date Noted   Malignant neoplasm of prostate ([place]) 02/03/2021   Trigger finger of right hand 09/22/2020   Anxiety attack 08/04/2019   Glaucoma 10/05/2016   Onychomycosis 03/09/2015   Hypokalemia 07/26/2014   Medicare annual wellness visit, subsequent 04/20/2014   Dyslipidemia 01/14/2013   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   Follow-up examination, following unspecified surgery 01/22/2012   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   Dystrophic nail 10/23/2011   Iron deficiency anemia 06/29/2011   Personal history of colonic polyps 06/29/2011   Family history of malignant neoplasm of gastrointestinal tract 06/29/2011   CAD, NATIVE VESSEL 01/18/2010   Obesity, Class III, BMI 40-49.9 (morbid obesity) ([place]) 11/29/2009   Essential hypertension 04/05/2009   Hyperglycemia 04/05/2009   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   AORTIC INSUFFICIENCY, MODERATE 02/17/2009   CARDIOMYOPATHY, DILATED 02/17/2009   Thoracic aneurysm without mention of rupture 01/26/2009   Congestive heart failure ([place]) 01/04/2009   NONSPECIFIC ABNORMAL ELECTROCARDIOGRAM 01/04/2009    Immunization History  Administered Date(s) Administered   Fluad Quad(high Dose 65+) 04/22/2019, 05/07/2020, 05/10/2021   Influenza Split 05/15/2011, 05/22/2012   Influenza Whole 06/07/2009, 05/11/2010   Influenza, High Dose Seasonal PF 04/22/2015, 05/07/2017, 05/14/2018   Influenza,inj,Quad PF,6+ Mos 05/09/2013, 04/20/2014   Influenza-Unspecified 05/17/2016   Moderna Sars-Covid-2 Vaccination 09/20/2019, 10/28/2019, 07/06/2020   Pneumococcal Conjugate-13 05/09/2013   Pneumococcal Polysaccharide-23 04/16/2006,  04/22/2015   Td 04/14/2008    Conditions to be addressed/monitored: CHF, CAD, HTN, HLD, Anxiety, and glaucoma, prostate CA, elevated PSA; BPH; iron def anemia;  chronic hypokalemia; GERD  Care Plan : General Pharmacy (Adult)  Updates made by [name], RPH-CPP since 06/30/2021 12:00 AM     Problem:  Chronic Disease Management support, education, and care coordination needs related to Hypertension, Hyperlipidemia/CAD, Heart Failure, Anxiety, GERD, BPH, Glaucoma, Anemia   Priority: High  Onset  Date: 11/29/2020  Note:   Current Barriers:  Unable to maintain control of HTN History of CAD found when having AVR surgery; No current statin therapy  Pharmacist Clinical Goal(s):  Over the next 90 days, patient will achieve adherence to monitoring guidelines and medication adherence to achieve therapeutic efficacy achieve control of hyperlipidemia as evidenced by LDL <70 maintain control of HTN as evidenced by BP < 130/80  through collaboration with PharmD and provider.   Interventions: 1:1 collaboration with [name], MD regarding development and update of comprehensive plan of care as evidenced by provider attestation and co-signature Inter-disciplinary care team collaboration (see longitudinal plan of care) Comprehensive medication review performed; medication list updated in electronic medical record  Pre -Diabetes / Obesity: Improving; Patient reports losing about 30 lbs over the last few months thru dietary changes Initial weight 327lbs; Weight at last appt = 310lbs (04/11/2021) Current glucose readings: does not check BG at home Current meal patterns: eating more vegetables; has stopped drinking regular sodas (previously was drinking 2 per day at least); increase in water intake; no fried foods Current exercise: none (patient to started radiation in July 2022 - has 17 treatments left)  Interventions:  Plan to increase physical activity / walking to keep weight down and  prevent diabetes - goal is 150 minutes per week;  continue with dietary changes that have lead to weight loss.   Hypertension / CHF: Recent BP reading at radiation appointments have been improved; BG goal <130/80 Blood pressure at home: 130-140 / 60 - 73  Current regimen:  Carvedilol 83m twice daily  Triamterene/hctz 37.5/25mg daily Amlodipine 150mdaily Losartan 10081maily  Furosemide 96m59mice a day Patient reports he has not experienced dizziness in the last 1 to 2 months Interventions:  Recommended continue current medications;  Continue to check blood pressure 2 to 3 times per week and record  Hyperlipidemia / CAD: Uncontrolled; LDL goal <70 Last LDL was 95 (12/07/2020) No current treatment Medications previously tried: none Current dietary patterns: limiting serving sizes and sodas; increase in vegetable Interventions:  Educated on physical activity as able;  continue to avoid sodas and high fat foods;  Recommended trial of statin if LDL remains >70; has order for lipids - reminded patient to have lipids checked.   BPH / prostate cancer New diagnosis of prostate cancer 11/26/2020 Patient has completed radiation 04/21/2021.  States he tolerated well. He stayed positive and was grateful for the great radiation oncology staff.  He will have first PSA rechecked 07/2021 Patient denied nocturia (had a little nocturia during radiation but has resolved. Current regimen:  Tamsulosin 0.4mg 4mly Interventions:  Continue current therapy for BPH F/U with Rad Onc and urology as planned.   GERD:  Controlled Current therapy:  Omeprazole 96mg 27my  Patient reports omprazole was started in hospital after his heart surgery. He does not recall ever having symptoms of reflux.  He tried taking omeprazole every other day after or last appointment and noticed reflux symptoms when he laid down at night. He restart daily omeprazole and symptoms improved.  Interventions: (addressed at  previous visit) Continue to take omeprazole 96mg d51m.   Health Maintenance  Interventions: Recommend patient receive Td booster in 2023 Recommend patient receive Shingrix in 2023 (cost anticipated to be lower in 2023)  Recommend getting COVID bivalent booster  Medication management [facility] Interventions Comprehensive medication review performed and medication list updated.  Continue current medication management strategy Recommendations: Focus on medication adherence by filling  and taking medications appropriately  Take medications as prescribed Report any questions or concerns to PharmD and/or provider(s)  Patient Goals/Self-Care Activities Over the next 90 days, patient will:  take medications as prescribed, check blood pressure 2 to 3 times per week, document, and provide at future appointments, target a minimum of 150 minutes of moderate intensity exercise weekly, and engage in dietary modifications by continuing to avoid sodas, eat more vegetable and limit high fat foods  Follow Up Plan: Telephone follow up appointment with care management team member scheduled for:  3 months      Medication Assistance: None required.  Patient affirms current coverage meets needs.  Patient's [facility] pharmacy [facility]:  [id number] [id number]: [phone]: [phone]  Follow Up:  Patient agrees to Care Plan and Follow-up.  Plan: Telephone follow up appointment with care management team member scheduled for:  3 months  TCherre Robins PharmD Clinical Pharmacist [facility]

## 2021-07-01 ENCOUNTER — Ambulatory Visit: Payer: Medicare Other | Attending: Internal Medicine

## 2021-07-01 ENCOUNTER — Telehealth: Payer: Self-pay | Admitting: *Deleted

## 2021-07-01 DIAGNOSIS — Z23 Encounter for immunization: Secondary | ICD-10-CM

## 2021-07-01 NOTE — Progress Notes (Signed)
   Covid-19 Vaccination Clinic  Name:  MARLENE PFLUGER    MRN: [id number] DOB: 1940/12/02  07/01/2021  Mr. Square was observed post Covid-19 immunization for 15 minutes without incident. He was provided with Vaccine Information Sheet and instruction to access the V-Safe system.   Mr. Ensminger was instructed to call 911 with any severe reactions post vaccine: Difficulty breathing  Swelling of face and throat  A fast heartbeat  A bad rash all over body  Dizziness and weakness   Immunizations Administered     Name Date Dose VIS Date Route   Moderna Covid-19 vaccine Bivalent Booster 07/01/2021  2:40 PM 0.5 mL 04/02/2021 Intramuscular   Manufacturer: Moderna   Lot: [id number]   [place]: 80063-494-94

## 2021-07-05 DIAGNOSIS — L249 Irritant contact dermatitis, unspecified cause: Secondary | ICD-10-CM | POA: Diagnosis not present

## 2021-07-05 DIAGNOSIS — L589 Radiodermatitis, unspecified: Secondary | ICD-10-CM | POA: Diagnosis not present

## 2021-07-05 DIAGNOSIS — L821 Other seborrheic keratosis: Secondary | ICD-10-CM | POA: Diagnosis not present

## 2021-07-19 ENCOUNTER — Other Ambulatory Visit (HOSPITAL_BASED_OUTPATIENT_CLINIC_OR_DEPARTMENT_OTHER): Payer: Self-pay

## 2021-07-19 DIAGNOSIS — Z23 Encounter for immunization: Secondary | ICD-10-CM | POA: Diagnosis not present

## 2021-07-19 MED ORDER — MODERNA COVID-19 BIVAL BOOSTER 50 MCG/0.5ML IM SUSP
INTRAMUSCULAR | 0 refills | Status: DC
  Filled 2021-07-19: qty 0.5, 1d supply, fill #0

## 2021-07-20 DIAGNOSIS — C61 Malignant neoplasm of prostate: Secondary | ICD-10-CM

## 2021-07-20 DIAGNOSIS — I110 Hypertensive heart disease with heart failure: Secondary | ICD-10-CM | POA: Diagnosis not present

## 2021-07-20 DIAGNOSIS — I509 Heart failure, unspecified: Secondary | ICD-10-CM | POA: Diagnosis not present

## 2021-07-20 DIAGNOSIS — E785 Hyperlipidemia, unspecified: Secondary | ICD-10-CM

## 2021-07-20 DIAGNOSIS — I2510 Atherosclerotic heart disease of native coronary artery without angina pectoris: Secondary | ICD-10-CM

## 2021-08-01 ENCOUNTER — Other Ambulatory Visit: Payer: Self-pay | Admitting: Family Medicine

## 2021-08-02 ENCOUNTER — Inpatient Hospital Stay: Payer: Medicare Other | Attending: Radiation Oncology | Admitting: *Deleted

## 2021-08-02 ENCOUNTER — Other Ambulatory Visit: Payer: Self-pay

## 2021-08-02 ENCOUNTER — Encounter: Payer: Self-pay | Admitting: *Deleted

## 2021-08-02 DIAGNOSIS — C61 Malignant neoplasm of prostate: Secondary | ICD-10-CM

## 2021-08-02 DIAGNOSIS — B353 Tinea pedis: Secondary | ICD-10-CM | POA: Diagnosis not present

## 2021-08-02 DIAGNOSIS — L300 Nummular dermatitis: Secondary | ICD-10-CM | POA: Diagnosis not present

## 2021-08-02 NOTE — Progress Notes (Signed)
°  2 Identifiers used for verification purposes for this visit. Unable to get vital signs  since this was a telephone visit. Pt says, " I feel almost like my old self". Pt has some urgency during the day with urination.  No hesitancy with urination. He is sleeping well . Nocturia x 1 at night. Pt denies fatigue. States urine flow is almost normal. Pt will visit with urologist in January.Last colonoscopy was 09/25/11. Pt will see Dr. Anabel Bene, dermatologist for a f/u to discoloration of skin to groin area this week.

## 2021-08-10 ENCOUNTER — Telehealth: Payer: Self-pay | Admitting: Cardiovascular Disease

## 2021-08-10 DIAGNOSIS — I5033 Acute on chronic diastolic (congestive) heart failure: Secondary | ICD-10-CM

## 2021-08-10 MED ORDER — FUROSEMIDE 40 MG PO TABS: 40.0000 mg | ORAL_TABLET | Freq: Two times a day (BID) | ORAL | 0 refills | Status: DC

## 2021-08-10 NOTE — Telephone Encounter (Signed)
°*  STAT* If patient is at the pharmacy, call can be transferred to refill team.   1. Which medications need to be refilled? (please list name of each medication and dose if known) furosemide (LASIX) 40 MG tablet  2. Which pharmacy/location (including street and city if local pharmacy) is medication to be sent to? [id number] [place] [place]  3. Do they need a 30 day or 90 day supply?  90 day supply

## 2021-08-10 NOTE — Telephone Encounter (Signed)
Pt's medication was sent to pt's pharmacy as requested. Confirmation received.  °

## 2021-09-02 DIAGNOSIS — C61 Malignant neoplasm of prostate: Secondary | ICD-10-CM | POA: Diagnosis not present

## 2021-09-09 DIAGNOSIS — C61 Malignant neoplasm of prostate: Secondary | ICD-10-CM | POA: Diagnosis not present

## 2021-09-09 DIAGNOSIS — N400 Enlarged prostate without lower urinary tract symptoms: Secondary | ICD-10-CM | POA: Diagnosis not present

## 2021-09-14 ENCOUNTER — Encounter: Payer: Self-pay | Admitting: Podiatry

## 2021-09-14 ENCOUNTER — Ambulatory Visit (INDEPENDENT_AMBULATORY_CARE_PROVIDER_SITE_OTHER): Payer: Medicare Other | Admitting: Podiatry

## 2021-09-14 ENCOUNTER — Other Ambulatory Visit: Payer: Self-pay

## 2021-09-14 DIAGNOSIS — M79674 Pain in right toe(s): Secondary | ICD-10-CM

## 2021-09-14 DIAGNOSIS — M79675 Pain in left toe(s): Secondary | ICD-10-CM | POA: Diagnosis not present

## 2021-09-14 DIAGNOSIS — B351 Tinea unguium: Secondary | ICD-10-CM | POA: Diagnosis not present

## 2021-09-14 NOTE — Patient Instructions (Signed)
Recommend Skechers Loafers with stretchable uppers and memory foam insoles. They can be purchased at [employer]. Also on [email or web address].    Oofos Oomg Eezee Low Shoes with stretchable uppers and memory foam insoles can be purchased online at: www.oofos.com. They can also be purchased at [place] store [facility]), [place] or [employer]. Also available in clogs, houseslippers, slide styles.

## 2021-09-20 DIAGNOSIS — I639 Cerebral infarction, unspecified: Secondary | ICD-10-CM | POA: Diagnosis not present

## 2021-09-20 DIAGNOSIS — Z794 Long term (current) use of insulin: Secondary | ICD-10-CM | POA: Diagnosis not present

## 2021-09-20 DIAGNOSIS — E1149 Type 2 diabetes mellitus with other diabetic neurological complication: Secondary | ICD-10-CM | POA: Diagnosis not present

## 2021-09-20 DIAGNOSIS — I10 Essential (primary) hypertension: Secondary | ICD-10-CM | POA: Diagnosis not present

## 2021-09-20 DIAGNOSIS — I739 Peripheral vascular disease, unspecified: Secondary | ICD-10-CM | POA: Diagnosis not present

## 2021-09-20 NOTE — Progress Notes (Addendum)
°  Subjective:  Patient ID: David Parsons, male    DOB: Feb 05, 1941,  MRN: [id number]  David Parsons presents to clinic today for painful elongated mycotic toenails 1-5 bilaterally which are tender when wearing enclosed shoe gear. Pain is relieved with periodic professional debridement.  New problem(s): None.   PCP is [name], MD , and last visit was 04/11/2021.  Allergies  Allergen Reactions   Latex Hives    Latex gloves     Review of Systems: Negative except as noted in the HPI. Objective:   Constitutional David Parsons is a pleasant 81 y.o. African American male, morbidly obese in NAD. AAO x 3.   Vascular CFT immediate b/l LE. Palpable DP/PT pulses b/l LE. Digital hair absent b/l. Skin temperature gradient WNL b/l. No pain with calf compression b/l. No edema noted b/l. No cyanosis or clubbing noted b/l LE.  Neurologic Normal speech. Oriented to person, place, and time. Protective sensation intact 5/5 intact bilaterally with 10g monofilament b/l.  Dermatologic Pedal integument with normal turgor, texture and tone b/l LE. No open wounds b/l. No interdigital macerations b/l. Toenails 1-5 b/l elongated, thickened, discolored with subungual debris. +Tenderness with dorsal palpation of nailplates. Hyperkeratotic lesion(s) noted submet head 5 b/l.  Orthopedic: Normal muscle strength 5/5 to all lower extremity muscle groups bilaterally. Hammertoe deformity noted 2-5 b/l. Pes planus deformity noted bilateral LE.Marland Kitchen No pain, crepitus or joint limitation noted with ROM b/l LE.  Patient ambulates independently without assistive aids.   Radiographs: None  Last A1c:  Hemoglobin A1C Latest Ref Rng & Units 04/11/2021 12/07/2020  HGBA1C 4.6 - 6.5 % 5.4 5.4  Some recent data might be hidden   Assessment:   1. Pain due to onychomycosis of toenails of both feet    Plan:  Patient was evaluated and treated and all questions answered. Consent given for treatment as described below: -Medicare  ABN signed. Patient refuses services of corn(s)/callus(es)/porokeratos(es) today. Copy has been placed in patient chart. -Mycotic toenails 1-5 bilaterally were debrided in length and girth with sterile nail nippers and dremel without incident. -Recommended shoes with stretchable uppers and memory foam insoles such as Skechers or Oofos. -Patient/POA to call should there be question/concern in the interim.  Return in about 3 months (around 12/13/2021).  Marzetta Board, DPM

## 2021-09-23 DIAGNOSIS — R2990 Unspecified symptoms and signs involving the nervous system: Secondary | ICD-10-CM | POA: Diagnosis not present

## 2021-09-23 DIAGNOSIS — I10 Essential (primary) hypertension: Secondary | ICD-10-CM | POA: Diagnosis not present

## 2021-09-23 DIAGNOSIS — E1149 Type 2 diabetes mellitus with other diabetic neurological complication: Secondary | ICD-10-CM | POA: Diagnosis not present

## 2021-09-23 DIAGNOSIS — I739 Peripheral vascular disease, unspecified: Secondary | ICD-10-CM | POA: Diagnosis not present

## 2021-09-23 DIAGNOSIS — Z794 Long term (current) use of insulin: Secondary | ICD-10-CM | POA: Diagnosis not present

## 2021-09-23 DIAGNOSIS — R1110 Vomiting, unspecified: Secondary | ICD-10-CM | POA: Diagnosis not present

## 2021-09-23 DIAGNOSIS — I639 Cerebral infarction, unspecified: Secondary | ICD-10-CM | POA: Diagnosis not present

## 2021-09-24 DIAGNOSIS — R110 Nausea: Secondary | ICD-10-CM | POA: Diagnosis not present

## 2021-09-24 DIAGNOSIS — Z794 Long term (current) use of insulin: Secondary | ICD-10-CM | POA: Diagnosis not present

## 2021-09-24 DIAGNOSIS — I10 Essential (primary) hypertension: Secondary | ICD-10-CM | POA: Diagnosis not present

## 2021-09-24 DIAGNOSIS — I639 Cerebral infarction, unspecified: Secondary | ICD-10-CM | POA: Diagnosis not present

## 2021-09-24 DIAGNOSIS — K3184 Gastroparesis: Secondary | ICD-10-CM | POA: Diagnosis not present

## 2021-09-24 DIAGNOSIS — E1149 Type 2 diabetes mellitus with other diabetic neurological complication: Secondary | ICD-10-CM | POA: Diagnosis not present

## 2021-09-24 DIAGNOSIS — I739 Peripheral vascular disease, unspecified: Secondary | ICD-10-CM | POA: Diagnosis not present

## 2021-09-24 DIAGNOSIS — R2990 Unspecified symptoms and signs involving the nervous system: Secondary | ICD-10-CM | POA: Diagnosis not present

## 2021-09-24 DIAGNOSIS — R1110 Vomiting, unspecified: Secondary | ICD-10-CM | POA: Diagnosis not present

## 2021-09-25 DIAGNOSIS — K3184 Gastroparesis: Secondary | ICD-10-CM | POA: Diagnosis not present

## 2021-09-25 DIAGNOSIS — K5900 Constipation, unspecified: Secondary | ICD-10-CM | POA: Diagnosis not present

## 2021-09-25 DIAGNOSIS — Z794 Long term (current) use of insulin: Secondary | ICD-10-CM | POA: Diagnosis not present

## 2021-09-25 DIAGNOSIS — E1149 Type 2 diabetes mellitus with other diabetic neurological complication: Secondary | ICD-10-CM | POA: Diagnosis not present

## 2021-09-25 DIAGNOSIS — I639 Cerebral infarction, unspecified: Secondary | ICD-10-CM | POA: Diagnosis not present

## 2021-09-25 DIAGNOSIS — I10 Essential (primary) hypertension: Secondary | ICD-10-CM | POA: Diagnosis not present

## 2021-09-26 ENCOUNTER — Ambulatory Visit (INDEPENDENT_AMBULATORY_CARE_PROVIDER_SITE_OTHER): Payer: Medicare Other | Admitting: Pharmacist

## 2021-09-26 DIAGNOSIS — I509 Heart failure, unspecified: Secondary | ICD-10-CM

## 2021-09-26 DIAGNOSIS — E785 Hyperlipidemia, unspecified: Secondary | ICD-10-CM

## 2021-09-26 DIAGNOSIS — I2510 Atherosclerotic heart disease of native coronary artery without angina pectoris: Secondary | ICD-10-CM

## 2021-09-26 DIAGNOSIS — I10 Essential (primary) hypertension: Secondary | ICD-10-CM

## 2021-09-26 MED ORDER — LOSARTAN POTASSIUM 100 MG PO TABS: 100.0000 mg | ORAL_TABLET | Freq: Every day | ORAL | 0 refills | Status: DC

## 2021-09-26 NOTE — Chronic Care Management (AMB) (Signed)
Chronic Care Management Pharmacy Note  09/26/2021 Name:  David Parsons MRN:  [id number] DOB:  09-23-1940  Summary:  Check Lipids at next appointment in April 2023 - if LDL > 70 consider adding statin therapy Patient reminded to get Shingrix and tetanus vaccines- plan to get in next 2 months.  Assisted with refill for omeprazole for GERD.    Subjective: David Parsons is an 81 y.o. year old male who is a primary patient of [name], MD.  The CCM team was consulted for assistance with disease management and care coordination needs.    Engaged with patient by telephone for follow up visit in response to provider referral for pharmacy case management and/or care coordination services.   Consent to Services:  The patient was given information about Chronic Care Management services, agreed to services, and gave verbal consent prior to initiation of services.  Please see initial visit note for detailed documentation.   Patient Care Team: [name], MD as PCP - General (Family Medicine) Burnell Blanks, MD as Consulting Physician (Cardiology) Gatha Mayer, MD as Consulting Physician (Gastroenterology) Druscilla Brownie, MD as Consulting Physician (Dermatology) Calvert Cantor, MD as Consulting Physician (Ophthalmology) Carolan Clines, MD (Inactive) as Consulting Physician (Urology) Gardiner Barefoot, DPM as Consulting Physician (Podiatry) [name], [place] (Pharmacist) Franchot Gallo, MD as Consulting Physician (Urology) Delice Bison, Charlestine Massed, NP as Nurse Practitioner (Hematology and Oncology) Harmon Pier, RN as Registered Nurse Tyler Pita, MD as Consulting Physician (Radiation Oncology)  Recent office visits: 04/11/2021 - Fam Med ([name]) F/U HTN. Reported dizziness improved.   Recent consult visits: 09/14/2021 - Podiatry (Dr Elisha Ponder) Debridement perfomred for elongated mycotic tonails 09/09/2021 - Urology (Dr Luberta Robertson) F/U prostate  cancer and urinary urgency. PSA was 3.05. No radiation related issued except urgency. Note that tamsulosin was not on urology office med list. F/U 4 months.  07/21/2021 - Oncology (nurse visit) to f/u prostate neoplasm 06/08/2021 - Rad Onc (Bruning, PAC) F/U post radiation for prostate cancer. continue to follow up with urology for ongoing PSA determinations - due 07/2021. 06/06/2021 - Podiatry (Dr Elisha Ponder) Nail care for pain due to onychomycosis. Declined paring of calluses today. Toenails debrided and clipped.  04/21/2021 - Rad Onc (Dr Tammi Klippel) Seen for f/u prostate cancer. has completed radiation therapy. Experiencee some minor urinary irriation and modest fatigue.    Hospital visits: None in previous 6 months  Objective:  Lab Results  Component Value Date   CREATININE 1.00 04/18/2021   CREATININE 1.00 12/07/2020   CREATININE 0.99 10/28/2019    Lab Results  Component Value Date   HGBA1C 5.4 04/11/2021   Last diabetic Eye exam: No results found for: HMDIABEYEEXA  Last diabetic Foot exam: No results found for: HMDIABFOOTEX      Component Value Date/Time   CHOL 160 12/07/2020 0928   TRIG 145.0 12/07/2020 0928   HDL 36.10 (L) 12/07/2020 0928   CHOLHDL 4 12/07/2020 0928   VLDL 29.0 12/07/2020 0928   LDLCALC 95 12/07/2020 0928    Hepatic Function Latest Ref Rng & Units 04/18/2021 12/07/2020 10/28/2019  Total Protein 6.0 - 8.3 g/dL 6.2 6.9 6.9  Albumin 3.5 - 5.2 g/dL 3.6 3.9 3.9  AST 0 - 37 U/L 15 12 16   ALT 0 - 53 U/L 11 12 14   Alk Phosphatase 39 - 117 U/L 58 75 72  Total Bilirubin 0.2 - 1.2 mg/dL 1.2 1.4(H) 1.0  Bilirubin, Direct 0.0 - 0.3 mg/dL - - -  Lab Results  Component Value Date/Time   TSH 1.83 04/11/2021 02:39 PM   TSH 1.79 12/07/2020 09:28 AM    CBC Latest Ref Rng & Units 04/11/2021 12/07/2020 05/10/2020  WBC 4.0 - 10.5 K/uL 5.6 8.4 10.0  Hemoglobin 13.0 - 17.0 g/dL 12.3(L) 13.9 13.1(L)  Hematocrit 39.0 - 52.0 % 36.6(L) 42.1 40.3  Platelets 150.0 - 400.0 K/uL  190.0 284.0 454(H)    No results found for: VD25OH  Clinical ASCVD: Yes  The 10-year ASCVD risk score (Arnett DK, et al., 2019) is: 44.7%   Values used to calculate the score:     Age: 13 years     Sex: Male     Is Non-Hispanic African American: Yes     Diabetic: Yes     Tobacco smoker: No     Systolic Blood Pressure: 992 mmHg     Is BP treated: Yes     HDL Cholesterol: 36.1 mg/dL     Total Cholesterol: 160 mg/dL     Social History   Tobacco Use  Smoking Status Never  Smokeless Tobacco Never   BP Readings from Last 3 Encounters:  04/11/21 132/68  10/25/20 114/86  06/17/20 (!) 161/92   Pulse Readings from Last 3 Encounters:  04/11/21 76  10/25/20 66  06/17/20 86   Wt Readings from Last 3 Encounters:  04/11/21 (!) 310 lb (140.6 kg)  10/25/20 (!) 310 lb (140.6 kg)  05/10/20 (!) 317 lb (143.8 kg)    Assessment: Review of patient past medical history, allergies, medications, health status, including review of consultants reports, laboratory and other test data, was performed as part of comprehensive evaluation and provision of chronic care management services.   SDOH:  (Social Determinants of Health) assessments and interventions performed:      CCM Care Plan  Allergies  Allergen Reactions   Latex Hives    Latex gloves     Medications Reviewed Today     Reviewed by [name], RPH-CPP (Pharmacist) on 09/26/21 at 1051  Med List Status: <None>   Medication Order Taking? Sig Documenting Provider Last Dose Status Informant  amLODipine (NORVASC) 10 MG tablet [id number] Yes TAKE 1 TABLET BY MOUTH EVERY DAY [name], MD Taking Active   aspirin EC 81 MG tablet [id number] Yes Take 81 mg by mouth daily. [provider] Taking Active   bimatoprost (LUMIGAN) 0.01 % SOLN [id number] Yes Place 1 drop into both eyes at bedtime. [provider] Taking Active   carvedilol (COREG) 25 MG tablet [id number] Yes TAKE 1 TABLET TWICE A DAY WITH FOOD [name], MD Taking Active   Cholecalciferol (VITAMIN D3) 50 MCG (2000 UT) CAPS [id number] Yes Take 1 capsule by mouth daily. [provider] Taking Active   clobetasol ointment (TEMOVATE) 0.05 % [id number] Yes Apply 1 application topically 2 (two) times daily as needed. [provider] Taking Active            Med Note (CANTER, Vilma Prader D   Fri Aug 31, 2017 10:23 AM)    diazepam (VALIUM) 5 MG tablet [id number] Yes TAKE 1/2-2 TABLETS BY MOUTH DAILY AS NEEDED FOR ANXIETY. Copland, Gay Filler, MD Taking Active            Med Note Antony Contras, [place] B   Mon Sep 26, 2021 10:40 AM) Uses rarely - about 3 or 4 times per year  ferrous sulfate 325 (65 FE) MG tablet [id number] Yes TAKE 1 TABLET BY MOUTH 2 TIMES DAILY WITH A  MEAL. [name], MD Taking Active   furosemide (LASIX) 40 MG tablet [id number] Yes Take 1 tablet (40 mg total) by mouth 2 (two) times daily. Please keep upcoming appt in March 2023 with Dr. Angelena Form before anymore refills. Thank you Final Attempt Burnell Blanks, MD Taking Active   KLOR-CON M20 20 MEQ tablet [id number] Yes TAKE 3 TABLETS (60 MEQ TOTAL) BY MOUTH 3 (THREE) TIMES DAILY. [name], MD Taking Active   losartan (COZAAR) 100 MG tablet [id number] Yes TAKE 1 TABLET BY MOUTH EVERY DAY Burnell Blanks, MD Taking Active   Multiple Vitamin (MULITIVITAMIN WITH MINERALS) TABS [id number] Yes Take 1 tablet by mouth daily. [provider] Taking Active Spouse/Significant Other  omeprazole (PRILOSEC) 40 MG capsule [id number] Yes TAKE 1 CAPSULE BY MOUTH EVERY DAY [name], MD Taking Active   tamsulosin (FLOMAX) 0.4 MG CAPS capsule [id number] No Take 1 capsule (0.4 mg total) by mouth daily.  Patient not taking: Reported on 09/26/2021   Freeman Caldron, PA-C Not Taking Active            Med Note Barbaraann Boys Sep 26, 2021 10:49 AM) Patient wanted to keep on list - using as needed. Has not taken in last month. Is not on med list from urologist.   timolol (TIMOPTIC) 0.5 % ophthalmic solution [id number] Yes INSTILL 1 DROP INTO BOTH EYES TWICE A DAY [provider] Taking Active   triamterene-hydrochlorothiazide (MAXZIDE-25) 37.5-25 MG tablet [id number] Yes TAKE 1 TABLET BY MOUTH EVERY DAY [name], MD Taking Active             Patient Active Problem List   Diagnosis Date Noted   Malignant neoplasm of prostate ([place]) 02/03/2021   Trigger finger of right hand 09/22/2020   Anxiety attack 08/04/2019   Glaucoma 10/05/2016   Onychomycosis 03/09/2015   Hypokalemia 07/26/2014   Medicare annual wellness visit, subsequent 04/20/2014   Dyslipidemia 01/14/2013   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   Follow-up examination, following unspecified surgery 01/22/2012   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   Dystrophic nail 10/23/2011   Iron deficiency anemia 06/29/2011   Personal history of colonic polyps 06/29/2011   Family history of malignant neoplasm of gastrointestinal tract 06/29/2011   CAD, NATIVE VESSEL 01/18/2010   Obesity, Class III, BMI 40-49.9 (morbid obesity) ([place]) 11/29/2009   Essential hypertension 04/05/2009   Hyperglycemia 04/05/2009   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   AORTIC INSUFFICIENCY, MODERATE 02/17/2009   CARDIOMYOPATHY, DILATED 02/17/2009   Thoracic aneurysm without mention of rupture 01/26/2009   Congestive heart failure ([place]) 01/04/2009   NONSPECIFIC ABNORMAL ELECTROCARDIOGRAM 01/04/2009    Immunization History  Administered Date(s) Administered   Fluad Quad(high Dose 65+) 04/22/2019, 05/07/2020, 05/10/2021   Influenza Split 05/15/2011, 05/22/2012   Influenza Whole 06/07/2009, 05/11/2010   Influenza, High Dose Seasonal PF 04/22/2015, 05/07/2017, 05/14/2018   Influenza,inj,Quad PF,6+ Mos 05/09/2013, 04/20/2014   Influenza-Unspecified 05/17/2016   Moderna Covid-19 Vaccine Bivalent Booster 19yr & up 07/01/2021   Moderna Sars-Covid-2 Vaccination 09/20/2019,  10/28/2019, 07/06/2020   Pneumococcal Conjugate-13 05/09/2013   Pneumococcal Polysaccharide-23 04/16/2006, 04/22/2015   Td 04/14/2008    Conditions to be addressed/monitored: CHF, CAD, HTN, HLD, Anxiety, and glaucoma, prostate CA, elevated PSA; BPH; iron def anemia;  chronic hypokalemia; GERD  Care Plan : General Pharmacy (Adult)  Updates made by ECherre Robins RPH-CPP since 09/26/2021 12:00 AM     Problem:  Chronic Disease Management support, education,  and care coordination needs related to Hypertension, Hyperlipidemia/CAD, Heart Failure, Anxiety, GERD, BPH, Glaucoma, Anemia   Priority: High  Onset Date: 11/29/2020  Note:   Current Barriers:  Unable to maintain control of HTN History of CAD found when having AVR surgery; No current statin therapy  Pharmacist Clinical Goal(s):  Over the next 90 days, patient will achieve adherence to monitoring guidelines and medication adherence to achieve therapeutic efficacy achieve control of hyperlipidemia as evidenced by LDL <70 maintain control of HTN as evidenced by BP < 130/80  through collaboration with PharmD and provider.   Interventions: 1:1 collaboration with [name], MD regarding development and update of comprehensive plan of care as evidenced by provider attestation and co-signature Inter-disciplinary care team collaboration (see longitudinal plan of care) Comprehensive medication review performed; medication list updated in electronic medical record  Pre -Diabetes / Obesity: Improving; Patient reports losing about 30 lbs over the 12 months thru dietary changes Initial weight 327lbs; Weight at last appt = 310lbs (04/11/2021) Goal weight per patient = 250lbs Current glucose readings: does not check BG at home Current meal patterns: eating more vegetables; has stopped drinking regular sodas (previously was drinking 2 per day at least); increase in water intake; no fried foods; eating smaller portions Current exercise: walking  a few days per week - usually about 30 minutes 3 to 4 days per week. Interventions:  Continue with increased physical activity / walking to keep weight down and prevent diabetes - goal is 150 minutes per week;  Continue with dietary changes that have lead to weight loss.   Hypertension / CHF: Last office BP readings have been at goal; BG goal <130/80 Blood pressure at home: 120*130 / 60 - 70  Current regimen:  Carvedilol 79m twice daily  Triamterene/hydrochlorothiazide  37.5/25mg daily Amlodipine 195mdaily Losartan 10038maily  Furosemide 84m26mice a day Patient reports he has not experienced dizziness in the last 6 months Reports very little edema. Controlled with furosemide  Interventions:  Recommended continue current medications;  Continue to check blood pressure 2 to 3 times per week and record Discussed signs and symptoms of CHF exacerbation - weight gain, SOB, abdominal fullness, swelling in legs or abdomen, Fatigue and weakness, changes in ability to perform usual activities, persistent cough or wheezing with white or pink blood-tinged mucus, nausea and lack of appetite Continue to weigh daily - report weight gain of more than 3 lbs in 24 hours or 5 lbs in 1 week.  Hyperlipidemia / CAD: Uncontrolled; LDL goal <70 Last LDL was 95 (12/07/2020) - was supposed to be rechecked 04/11/2021 but looks like there was an issue with lab order.  No current treatment Medications previously tried: none Current dietary patterns: limiting serving sizes and sodas; increase in vegetable Interventions:  Educated on physical activity as able;  Continue to avoid sodas and high fat foods;  Recommended recheck lipids at upcoming appointment with PCP 11/22/2021. Consider trial of statin if LDL remains >70  BPH / prostate cancer New diagnosis of prostate cancer 11/26/2020 Patient has completed radiation 04/21/2021.  States he tolerated well. He stayed positive and was grateful for the great  radiation oncology staff.  Managed by Dr DahlLuberta Robertsonoted that our med list still has tamsulosin listed but Urology office did not have tamsulosin on med list. Per patient he takes tamsuloin if needed for improved urine flow but has not needed to take in the last 4 months. He requested to keep it on his med list.  Current regimen:  Tamsulosin 0.78m daily (as needed per patient)  Interventions:  Continue to follow up with Dr DBeatrix Fetters   GERD:  Controlled Current therapy:  Omeprazole 432mdaily  Patient reports omprazole was started in hospital after his heart surgery. He does not recall ever having symptoms of reflux.  He tried taking omeprazole every other day after or last appointment and noticed reflux symptoms when he laid down at night. He restart daily omeprazole and symptoms improved.  Interventions:  Continue to take omeprazole 4066maily.  Coordinated refill for omerpzole   Health Maintenance  Interventions: Recommend patient receive Tetanus booster in 2023 Recommend patient receive Shingrix in 2023   Medication management [facility] Interventions Comprehensive medication review performed and medication list updated.  Continue current medication management strategy Recommendations: Focus on medication adherence by filling and taking medications appropriately  Take medications as prescribed Report any questions or concerns to PharmD and/or provider(s)  Patient Goals/Self-Care Activities Over the next 90 days, patient will:  take medications as prescribed,  check blood pressure 2 to 3 times per week, document, and provide at future appointments,  target a minimum of 150 minutes of moderate intensity exercise weekly, and  engage in dietary modifications by continuing to avoid sodas, eat more vegetable and limit high fat foods Get cholesterol rechecked at next appointment with Dr BlyCharlett Blake/2023 Get Shingirx and Tetanus booster. Coverage should be better for  2023. [facility] get at [facility] or [facility] Follow Up Plan: Telephone follow up appointment with care management team member scheduled for:  3 months      Medication Assistance: None required.  Patient affirms current coverage meets needs.  [id number]: [phone]: [phone] Up:  Patient agrees to Care Plan and Follow-up.  Plan: Telephone follow up appointment with care management team member scheduled for:  3 months  TammCherre RobinsarmD Clinical Pharmacist [facility]

## 2021-09-26 NOTE — Patient Instructions (Signed)
David Parsons It was a pleasure speaking with you today.  I have attached a summary of our visit today and information about your health goals.   Patient Goals/Self-Care Activities Over the next 90 days, patient will:  take medications as prescribed,  check blood pressure 2 to 3 times per week, document, and provide at future appointments,  target a minimum of 150 minutes of moderate intensity exercise weekly, and  engage in dietary modifications by continuing to avoid sodas, eat more vegetable and limit high fat foods Get cholesterol rechecked at next appointment with Dr David Parsons 11/2021 Get Shingirx and Tetanus booster. Coverage should be better for 2023. Can get at [facility]) or when you are in our building in April at [facility].  Our next appointment is by telephone on Dec 19, 2021   Please call the care guide team at [phone] if you need to cancel or reschedule your appointment.   If you have any questions or concerns, please feel free to contact me either at the phone number below or with a MyChart message.   Keep up the good work!  David Parsons, PharmD Clinical Pharmacist [facility] [phone] (direct line)  [phone] (main office number)   CARE PLAN ENTRY   Hypertension / Thoracic aneurysm / Heart Failure: BP Readings from Last 3 Encounters:  04/11/21 132/68  10/25/20 114/86  06/17/20 (!) 161/92   Pharmacist Clinical Goal(s): Over the next 90 days, patient will work with PharmD and providers to maintain BP goal <130/80 Current regimen:  Carvedilol 25mg  twice daily  Triamterene/hydrochlorothiazide  37.5/25mg  daily Amlodipine 10mg  daily Losartan 100mg  daily Furosemide 40mg  twice daily Interventions: Discussed blood pressure goal Recommended check blood pressure at home 2 to 3 times per week and record Patient self care activities - Over the next 90 days, patient will: Check blood pressure 2 to 3  times week, document, and provide at future appointments Ensure daily salt intake < 2300 mg/day Continue current regimen for blood pressure control  Hyperlipidemia/ heart disease Lab Results  Component Value Date/Time   [facility] 95 12/07/2020 09:28 AM   Pharmacist Clinical Goal(s): Over the next 90 days, patient will work with PharmD and providers to achieve LDL goal < 70 Current regimen:  Aspirin 81mg  daily Interventions: Discussed LDL goal Considering statin therapy - will hold off until after upcoming radiation therpay Patient self care activities - Over the next 90 days, patient will: Consider adding statin if LDL >70 after rechecking cholesterol in April 2023.  Continue to avoid sodas and high fat foods;   BPH / Prostate cancer Pharmacist Clinical Goal(s) Over the next 90 days, patient will work with PharmD and providers to reduce symptoms associated with BPH Current regimen:  Tamsulosin 0.4mg  daily as needed  Patient self care activities - Over the next 90 days, patient will: Continue current therapy Continue to follow up with Dr. Luberta Parsons  GERD / acid reflux  Pharmacist Clinical Goal(s) Over the next 90 days, patient will work with PharmD and providers to prevent acid reflux symptoms and assess if omeprazole is still needed.  Current therapy:  Omeprazole 40mg  daily   Patient self care activities - Over the next 90 days, patient will:  Continue to take omeprazole 40mg  daily.  [facility]  Health Maintenance  Pharmacist Clinical Goal(s) Over the next 90 days, patient will work with PharmD and providers to complete health maintenance screenings/vaccinations Interventions: Recommend patient receive Tetanus booster  in 2023 Recommend patient receive Shingrix in 2023 Patient self care activities - Over the next 90 days, patient will: Plan to get Tetanus booster and Shingrix in 2023  Medication management Pharmacist Clinical Goal(s): Over the next 90  days, patient will work with PharmD and providers to maintain optimal medication adherence [facility] Interventions Comprehensive medication review performed and medication list updated.  Continue current medication management strategy Patient self care activities - Over the next 90 days, patient will: Focus on medication adherence by filling and taking medications appropriately  Take medications as prescribed Report any questions or concerns to PharmD and/or provider(s)  Patient Goals/Self-Care Activities Over the next 90 days, patient will:  take medications as prescribed,  check blood pressure 2 to 3 times per week, document, and provide at future appointments,  target a minimum of 150 minutes of moderate intensity exercise weekly, and  engage in dietary modifications by continuing to avoid sodas, eat more vegetable and limit high fat foods Get cholesterol rechecked at next appointment with Dr David Parsons 11/2021 Get Shingirx and Tetanus booster. Coverage should be better for 2023. Can get at [facility]) or when you are in our building in April at [facility].   Patient verbalizes understanding of instructions and care plan provided today and agrees to view in [place]. Active MyChart status confirmed with patient.

## 2021-09-28 DIAGNOSIS — E1149 Type 2 diabetes mellitus with other diabetic neurological complication: Secondary | ICD-10-CM | POA: Diagnosis not present

## 2021-09-28 DIAGNOSIS — I10 Essential (primary) hypertension: Secondary | ICD-10-CM | POA: Diagnosis not present

## 2021-09-28 DIAGNOSIS — K5900 Constipation, unspecified: Secondary | ICD-10-CM | POA: Diagnosis not present

## 2021-09-28 DIAGNOSIS — I639 Cerebral infarction, unspecified: Secondary | ICD-10-CM | POA: Diagnosis not present

## 2021-09-28 DIAGNOSIS — Z794 Long term (current) use of insulin: Secondary | ICD-10-CM | POA: Diagnosis not present

## 2021-09-28 DIAGNOSIS — K3184 Gastroparesis: Secondary | ICD-10-CM | POA: Diagnosis not present

## 2021-10-06 DIAGNOSIS — H401132 Primary open-angle glaucoma, bilateral, moderate stage: Secondary | ICD-10-CM | POA: Diagnosis not present

## 2021-10-06 DIAGNOSIS — H04123 Dry eye syndrome of bilateral lacrimal glands: Secondary | ICD-10-CM | POA: Diagnosis not present

## 2021-10-18 DIAGNOSIS — I509 Heart failure, unspecified: Secondary | ICD-10-CM

## 2021-10-18 DIAGNOSIS — I110 Hypertensive heart disease with heart failure: Secondary | ICD-10-CM

## 2021-10-18 DIAGNOSIS — E785 Hyperlipidemia, unspecified: Secondary | ICD-10-CM | POA: Diagnosis not present

## 2021-10-18 DIAGNOSIS — N400 Enlarged prostate without lower urinary tract symptoms: Secondary | ICD-10-CM

## 2021-10-18 DIAGNOSIS — I2510 Atherosclerotic heart disease of native coronary artery without angina pectoris: Secondary | ICD-10-CM

## 2021-11-01 ENCOUNTER — Other Ambulatory Visit: Payer: Self-pay | Admitting: Cardiovascular Disease

## 2021-11-01 DIAGNOSIS — I5033 Acute on chronic diastolic (congestive) heart failure: Secondary | ICD-10-CM

## 2021-11-02 ENCOUNTER — Other Ambulatory Visit: Payer: Self-pay | Admitting: Family Medicine

## 2021-11-10 ENCOUNTER — Ambulatory Visit (INDEPENDENT_AMBULATORY_CARE_PROVIDER_SITE_OTHER): Payer: Medicare Other | Admitting: Cardiovascular Disease

## 2021-11-10 ENCOUNTER — Encounter: Payer: Self-pay | Admitting: Cardiovascular Disease

## 2021-11-10 ENCOUNTER — Other Ambulatory Visit: Payer: Self-pay

## 2021-11-10 VITALS — BP 112/66 | HR 70 | Ht 73.0 in | Wt 305.0 lb

## 2021-11-10 DIAGNOSIS — I2510 Atherosclerotic heart disease of native coronary artery without angina pectoris: Secondary | ICD-10-CM

## 2021-11-10 DIAGNOSIS — I5032 Chronic diastolic (congestive) heart failure: Secondary | ICD-10-CM

## 2021-11-10 DIAGNOSIS — I359 Nonrheumatic aortic valve disorder, unspecified: Secondary | ICD-10-CM

## 2021-11-10 DIAGNOSIS — I10 Essential (primary) hypertension: Secondary | ICD-10-CM

## 2021-11-10 NOTE — Progress Notes (Signed)
? ? ?Chief Complaint  ?Patient presents with  ? Follow-up  ?  Thoracic aortic aneurysm  ? 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 He was referred in May 2010 for SOB, abdominal swelling and LE edema. He was diagnosed with a non-ischemic CM, moderate AI, non-obstructive CAD and a dilated aortic root. His aortic root continued to enlarge. Cardiac cath March 2013 showed enlarged root, no obstructive disease in the left system. I could not engage the RCA secondary to the abnormal takeoff with the enlarged aortic root. CTA was performed and showed dilated aortic root with patent RCA. He underwent aortic root replacement, aortic valve replacement and SVG to RCA on 12/28/11.  Echo August 2020 with LVEF=55-60%. Severe LVH.  AVR working well. He was diagnosed with prostate cancer in 2022 treated with radiation therapy.  ? ?He is here today for follow up. The patient denies any chest pain, dyspnea, palpitations, lower extremity edema, orthopnea, PND, dizziness, near syncope or syncope.  ?  ?Primary Care Physician: [name], MD ? ?Past Medical History:  ?Diagnosis Date  ? Aortic insufficiency   ?  02/16/09 Chest CT:  The aortic root has a diameter measuring 4.6 cm, image 64 of the coronal series.  The ascending    thoracic aorta has a diameter of 3.8 cm, image 65 of the coronal series.  At the level of the aortic arch    the thoracic aorta has a maximum diameter of 3.2 cm.  The descending thoracic aorta has a maximum           diameter of 3.2 cm.  There is no evidence for aortic disse        ? Aortic root enlargement ([place])   ? The aortic root has a diameter measuring 4.6 cm, image 64 of the coronal series.  The ascending  thoracic aorta has  a diameter of 3.8 cm, image 65 of the coronal series.  At the level of the aortic arch   the thoracic aorta has a maximum diameter of 3.2 cm.  The descending thoracic aorta has a maximum   diameter of 3.2 cm.  There is no evidence for aortic dissection.       ? CAD (coronary artery disease)   ? Cardiomyopathy   ?  01/2009 - Cath - nonobs. dzs.  EF 35%.  ? CHF (congestive heart failure) ([place])   ? Diabetes mellitus   ? Dyslipidemia 01/14/2013  ? Gallstones   ? s/p cholecystectomy    ? GERD (gastroesophageal reflux disease)   ? Glaucoma 10/05/2016  ? Heart murmur   ? Helicobacter pylori gastritis   ? (Tx Pylera 4/10)  ? Hypertension   ? Hypokalemia   ? Iron deficiency anemia   ? Malignant neoplasm of prostate ([place]) 02/03/2021  ? Obesity, Class III, BMI 40-49.9 (morbid obesity) ([place])   ? Onychomycosis 03/09/2015  ? PONV (postoperative nausea and vomiting)   ? 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 Biological Bentall Aortic Root Replacement using 25 mm Edwards Magna Ease pericardial tissue valve and 28 mm Vascutek Gelweave Valsalva conduit with reimplantation of left main and right coronary artery and CABG x1 using SVG to RCA  ? Sleep apnea   ?  occasionally wears CPAP at night  ? ? ?Past Surgical History:  ?Procedure Laterality Date  ? ADRENALECTOMY    ? BENTALL PROCEDURE  12/28/2011  ? Procedure: BENTALL PROCEDURE;  Surgeon: Rexene Alberts, MD;  Location: [place];  Service: Open Heart Surgery;  Laterality: N/A;  ? CARDIAC CATHETERIZATION    ? 2013  ? CARDIAC VALVE REPLACEMENT  12/28/2011  ? Bentall with tissue valve  ? CHOLECYSTECTOMY    ? COLONOSCOPY  09/25/2011  ? COLONOSCOPY W/ BIOPSIES AND POLYPECTOMY  11/04/2008  ? colon polyps, diverticulosis   ? CORONARY ARTERY BYPASS GRAFT  12/28/2011  ? Procedure: CORONARY ARTERY BYPASS GRAFTING (CABG);  Surgeon: Rexene Alberts, MD;  Location: [place];  Service: Open Heart Surgery;  Laterality: N/A;  cabg x 1  ? LEFT AND RIGHT HEART CATHETERIZATION WITH CORONARY  ANGIOGRAM N/A 11/22/2011  ? Procedure: LEFT AND RIGHT HEART CATHETERIZATION WITH CORONARY ANGIOGRAM;  Surgeon: Burnell Blanks, MD;  Location: [facility] C[facility] LAB;  Service: Cardiovascular;  Laterality: N/A;  ? MULTIPLE TOOTH EXTRACTIONS    ? PROSTATE BIOPSY Bilateral   ? TEE WITHOUT CARDIOVERSION  11/22/2011  ? Procedure: TRANSESOPHAGEAL ECHOCARDIOGRAM (TEE);  Surgeon: Larey Dresser, MD;  Location: [place];  Service: Cardiovascular;  Laterality: N/A;  ? UPPER GASTROINTESTINAL ENDOSCOPY  11/04/2008  ? w/bx, H pylori gastritis  ? UPPER GASTROINTESTINAL ENDOSCOPY  09/25/2011  ? ? ?Current Outpatient Medications  ?Medication Sig Dispense Refill  ? amLODipine (NORVASC) 10 MG tablet TAKE 1 TABLET BY MOUTH EVERY DAY 90 tablet 1  ? aspirin EC 81 MG tablet Take 81 mg by mouth daily.    ? bimatoprost (LUMIGAN) 0.01 % SOLN Place 1 drop into both eyes at bedtime.    ? carvedilol (COREG) 25 MG tablet TAKE 1 TABLET BY MOUTH TWICE A DAY WITH FOOD 180 tablet 1  ? Cholecalciferol (VITAMIN D3) 50 MCG (2000 UT) CAPS Take 1 capsule by mouth daily.    ? clobetasol ointment (TEMOVATE) 2.95 % Apply 1 application topically 2 (two) times daily as needed.  0  ? diazepam (VALIUM) 5 MG tablet TAKE 1/2-2 TABLETS BY MOUTH DAILY AS NEEDED FOR ANXIETY. 10 tablet 0  ? ferrous sulfate 325 (65 FE) MG tablet TAKE 1 TABLET BY MOUTH 2 TIMES DAILY WITH A MEAL. 180 tablet 2  ? furosemide (LASIX) 40 MG tablet TAKE 1 TABLET BY MOUTH 2 TIMES DAILY. PLEASE KEEP UPCOMING APPT IN [id number] BEFORE ANYMORE REFILLS 60 tablet 0  ? KLOR-CON M20 20 MEQ tablet TAKE 3 TABLETS (60 MEQ TOTAL) BY MOUTH 3 (THREE) TIMES DAILY. 810 tablet 1  ? losartan (COZAAR) 100 MG tablet Take 1 tablet (100 mg total) by mouth daily. 90 tablet 0  ? Multiple Vitamin (MULITIVITAMIN WITH MINERALS) TABS Take 1 tablet by mouth daily.    ? omeprazole (PRILOSEC) 40 MG capsule TAKE 1 CAPSULE BY MOUTH EVERY DAY 90 capsule 1  ? tamsulosin (FLOMAX) 0.4 MG CAPS capsule Take 1 capsule (0.4 mg  total) by mouth daily. 30 capsule 3  ? timolol (TIMOPTIC) 0.5 % ophthalmic solution INSTILL 1 DROP INTO BOTH EYES TWICE A DAY    ? triamterene-hydrochlorothiazide (MAXZIDE-25) 37.5-25 MG tablet TAKE 1 TABLET BY MOUTH EVERY DAY 90 tablet 2  ? ?No current facility-administered medications for this visit.  ? ? ?Allergies  ?Allergen Reactions  ? Latex Hives  ?  Latex gloves ?  ? ? ?Social History  ? ?Socioeconomic History  ? Marital status: Married  ?  Spouse name:  [name]  ? Number of children: 0  ? Years of education: Not on file  ? Highest education level: Not on file  ?Occupational History  ?  Employer: [employer]  ?  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 Smoking status: Never  ? Smokeless tobacco: Never  ?Vaping Use  ? Vaping Use: Never used  ?Substance and Sexual Activity  ? Alcohol use: No  ? Drug use: No  ? Sexual activity: Not Currently  ?  Comment: lives with wife, [name] 2 churches  ?Other Topics Concern  ? Not on file  ?Social History Narrative  ? The patient is married, lives with his wife in  [place].  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  He  lives a very sedentary lifestyle.  He is a nonsmoker.  He denies alcohol consumption.   ? ?Social Determinants of Health  ? ?Financial Resource Strain: Low Risk   ? Difficulty of Paying Living Expenses: Not very hard  ?Food Insecurity: No Food Insecurity  ? Worried About Charity fundraiser in the Last Year: Never true  ? Ran Out of Food in the Last Year: Never true  ?Transportation Needs: No Transportation Needs  ? Lack of Transportation (Medical): No  ? Lack of Transportation (Non-Medical): No  ?Physical Activity: Sufficiently Active  ? Days of Exercise per Week: 6 days  ? Minutes of Exercise per Session: 40 min  ?Stress: No Stress Concern Present  ? Feeling of Stress : Not at all  ?Social Connections: Socially Integrated  ? Frequency of Communication with Friends and Family: More than three times a week  ?  Frequency of Social Gatherings with Friends and Family: Three times a week  ? Attends Religious Services: More than 4 times per year  ? Active Member of Clubs or Organizations: Yes  ? Attends Club or Organization M

## 2021-11-10 NOTE — Patient Instructions (Signed)
Medication Instructions:  ?No changes ?*If you need a refill on your cardiac medications before your next appointment, please call your pharmacy* ? ? ?Lab Work: ?none ? ?Testing/Procedures: ?Your physician has requested that you have an echocardiogram. Echocardiography is a painless test that uses sound waves to create images of your heart. It provides your doctor with information about the size and shape of your heart and how well your heart?s chambers and valves are working. This procedure takes approximately one hour. There are no restrictions for this procedure. ? ? ?Follow-Up: ?At [facility], you and your health needs are our priority.  As part of our continuing mission to provide you with exceptional heart care, we have created designated Provider Care Teams.  These Care Teams include your primary Cardiologist (physician) and Advanced Practice Providers (APPs -  Physician Assistants and Nurse Practitioners) who all work together to provide you with the care you need, when you need it. ?   ? ?Your next appointment:   ?12 month(s) ? ?The format for your next appointment:   ?In Person ? ?Provider:   ?None { ? ?  ?

## 2021-11-21 NOTE — Progress Notes (Deleted)
? ?Subjective:  ? ? Patient ID: David Parsons, male    DOB: 04-30-1941, 81 y.o.   MRN: [id number] ? ?No chief complaint on file. ? ? ?HPI ?Patient is in today for a follow up. ? ?Past Medical History:  ?Diagnosis Date  ? Aortic insufficiency   ?  02/16/09 Chest CT:  The aortic root has a diameter measuring 4.6 cm, image 64 of the coronal series.  The ascending    thoracic aorta has a diameter of 3.8 cm, image 65 of the coronal series.  At the level of the aortic arch    the thoracic aorta has a maximum diameter of 3.2 cm.  The descending thoracic aorta has a maximum           diameter of 3.2 cm.  There is no evidence for aortic disse        ? Aortic root enlargement ([place])   ? The aortic root has a diameter measuring 4.6 cm, image 64 of the coronal series.  The ascending  thoracic aorta has a diameter of 3.8 cm, image 65 of the coronal series.  At the level of the aortic arch   the thoracic aorta has a maximum diameter of 3.2 cm.  The descending thoracic aorta has a maximum   diameter of 3.2 cm.  There is no evidence for aortic dissection.       ? CAD (coronary artery disease)   ? Cardiomyopathy   ?  01/2009 - Cath - nonobs. dzs.  EF 35%.  ? CHF (congestive heart failure) ([place])   ? Diabetes mellitus   ? Dyslipidemia 01/14/2013  ? Gallstones   ? s/p cholecystectomy    ? GERD (gastroesophageal reflux disease)   ? Glaucoma 10/05/2016  ? Heart murmur   ? Helicobacter pylori gastritis   ? (Tx Pylera 4/10)  ? Hypertension   ? Hypokalemia   ? Iron deficiency anemia   ? Malignant neoplasm of prostate ([place]) 02/03/2021  ? Obesity, Class III, BMI 40-49.9 (morbid obesity) ([place])   ? Onychomycosis 03/09/2015  ? PONV (postoperative nausea and vomiting)   ? 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 Biological Bentall Aortic Root Replacement using 25 mm Edwards Magna Ease pericardial tissue valve and 28 mm Vascutek Gelweave Valsalva conduit with reimplantation of left main and right coronary artery and CABG x1 using SVG to  RCA  ? Sleep apnea   ? occasionally wears CPAP at night  ? ? ?Past Surgical History:  ?Procedure Laterality Date  ? ADRENALECTOMY    ? BENTALL PROCEDURE  12/28/2011  ? Procedure: BENTALL PROCEDURE;  Surgeon: Rexene Alberts, MD;  Location: [place];  Service: Open Heart Surgery;  Laterality: N/A;  ? CARDIAC CATHETERIZATION    ? 2013  ? CARDIAC VALVE REPLACEMENT  12/28/2011  ? Bentall with tissue valve  ? CHOLECYSTECTOMY    ? COLONOSCOPY  09/25/2011  ? COLONOSCOPY W/ BIOPSIES AND POLYPECTOMY  11/04/2008  ? colon polyps, diverticulosis   ? CORONARY ARTERY BYPASS GRAFT  12/28/2011  ? Procedure: CORONARY ARTERY BYPASS GRAFTING (CABG);  Surgeon: Rexene Alberts, MD;  Location: [place];  Service: Open Heart Surgery;  Laterality: N/A;  cabg x 1  ? LEFT AND RIGHT HEART CATHETERIZATION WITH CORONARY ANGIOGRAM N/A 11/22/2011  ? Procedure: LEFT AND RIGHT HEART CATHETERIZATION WITH CORONARY ANGIOGRAM;  Surgeon: Burnell Blanks, MD;  Location[facility] C[facility] LAB;  Service: Cardiovascular;  Laterality: N/A;  ? MULTIPLE TOOTH EXTRACTIONS    ?  PROSTATE BIOPSY Bilateral   ? TEE WITHOUT CARDIOVERSION  11/22/2011  ? Procedure: TRANSESOPHAGEAL ECHOCARDIOGRAM (TEE);  Surgeon: Larey Dresser, MD;  Location: [place];  Service: Cardiovascular;  Laterality: N/A;  ? UPPER GASTROINTESTINAL ENDOSCOPY  11/04/2008  ? w/bx, H pylori gastritis  ? UPPER GASTROINTESTINAL ENDOSCOPY  09/25/2011  ? ? ?Family History  ?Problem Relation Age of Onset  ? Breast cancer Sister   ? Diabetes Sister   ? Colon cancer Brother 40  ?     at least 80  ? Diabetes Brother   ? Cancer Brother   ?     colon, prostate  ? Prostate cancer Brother   ? Diabetes Brother   ? Diabetes Sister   ? Cancer Sister   ?     breast, colon, melanoma  ? Heart disease Sister   ? Hypertension Mother   ? Arthritis Mother   ? Kidney failure Sister   ? Colon cancer Sister   ? Breast cancer Sister   ? Kidney disease Sister   ?     s/p transplant  ? Hypertension Sister   ? Diabetes Sister   ?  Stroke Father   ? Hypertension Father   ? ? ?Social History  ? ?Socioeconomic History  ? Marital status: Married  ?  Spouse name: David Parsons  ? Number of children: 0  ? Years of education: Not on file  ? Highest education level: Not on file  ?Occupational History  ?  Employer: [employer]  ?  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 Smoking status: Never  ? Smokeless tobacco: Never  ?Vaping Use  ? Vaping Use: Never used  ?Substance and Sexual Activity  ? Alcohol use: No  ? Drug use: No  ? Sexual activity: Not Currently  ?  Comment: lives with wife, David Parsons 2 churches  ?Other Topics Concern  ? Not on file  ?Social History Narrative  ? The patient is married, lives with his wife in  [place].  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  He  lives a very sedentary lifestyle.  He is a nonsmoker.  He denies alcohol consumption.   ? ?Social Determinants of Health  ? ?Financial Resource Strain: Low Risk   ? Difficulty of Paying Living Expenses: Not very hard  ?Food Insecurity: No Food Insecurity  ? Worried About Charity fundraiser in the Last Year: Never true  ? Ran Out of Food in the Last Year: Never true  ?Transportation Needs: No Transportation Needs  ? Lack of Transportation (Medical): No  ? Lack of Transportation (Non-Medical): No  ?Physical Activity: Sufficiently Active  ? Days of Exercise per Week: 6 days  ? Minutes of Exercise per Session: 40 min  ?Stress: No Stress Concern Present  ? Feeling of Stress : Not at all  ?Social Connections: Socially Integrated  ? Frequency of Communication with Friends and Family: More than three times a week  ? Frequency of Social Gatherings with Friends and Family: Three times a week  ? Attends Religious Services: More than 4 times per year  ? Active Member of Clubs or Organizations: Yes  ? Attends Archivist Meetings: More than 4 times per year  ? Marital Status: Married  ?Intimate Partner Violence: Not At Risk  ? Fear of Current or  Ex-Partner: No  ? Emotionally Abused: No  ? Physically Abused: No  ? Sexually Abused: No  ? ? ?Outpatient  Medications Prior to Visit  ?Medication Sig Dispense Refill  ? amLODipine (NORVASC) 10 MG tablet TAKE 1 TABLET BY MOUTH EVERY DAY 90 tablet 1  ? aspirin EC 81 MG tablet Take 81 mg by mouth daily.    ? bimatoprost (LUMIGAN) 0.01 % SOLN Place 1 drop into both eyes at bedtime.    ? carvedilol (COREG) 25 MG tablet TAKE 1 TABLET BY MOUTH TWICE A DAY WITH FOOD 180 tablet 1  ? Cholecalciferol (VITAMIN D3) 50 MCG (2000 UT) CAPS Take 1 capsule by mouth daily.    ? clobetasol ointment (TEMOVATE) 1.61 % Apply 1 application topically 2 (two) times daily as needed.  0  ? diazepam (VALIUM) 5 MG tablet TAKE 1/2-2 TABLETS BY MOUTH DAILY AS NEEDED FOR ANXIETY. 10 tablet 0  ? ferrous sulfate 325 (65 FE) MG tablet TAKE 1 TABLET BY MOUTH 2 TIMES DAILY WITH A MEAL. 180 tablet 2  ? furosemide (LASIX) 40 MG tablet TAKE 1 TABLET BY MOUTH 2 TIMES DAILY. PLEASE KEEP UPCOMING APPT IN [id number] BEFORE ANYMORE REFILLS 60 tablet 0  ? KLOR-CON M20 20 MEQ tablet TAKE 3 TABLETS (60 MEQ TOTAL) BY MOUTH 3 (THREE) TIMES DAILY. 810 tablet 1  ? losartan (COZAAR) 100 MG tablet Take 1 tablet (100 mg total) by mouth daily. 90 tablet 0  ? Multiple Vitamin (MULITIVITAMIN WITH MINERALS) TABS Take 1 tablet by mouth daily.    ? omeprazole (PRILOSEC) 40 MG capsule TAKE 1 CAPSULE BY MOUTH EVERY DAY 90 capsule 1  ? tamsulosin (FLOMAX) 0.4 MG CAPS capsule Take 1 capsule (0.4 mg total) by mouth daily. 30 capsule 3  ? timolol (TIMOPTIC) 0.5 % ophthalmic solution INSTILL 1 DROP INTO BOTH EYES TWICE A DAY    ? triamterene-hydrochlorothiazide (MAXZIDE-25) 37.5-25 MG tablet TAKE 1 TABLET BY MOUTH EVERY DAY 90 tablet 2  ? ?No facility-administered medications prior to visit.  ? ? ?Allergies  ?Allergen Reactions  ? Latex Hives  ?  Latex gloves ?  ? ? ?ROS ? ?   ?Objective:  ?  ?Physical Exam ? ?There were no vitals taken for this visit. ?Wt Readings from Last 3  Encounters:  ?11/10/21 (!) 305 lb (138.3 kg)  ?04/11/21 (!) 310 lb (140.6 kg)  ?10/25/20 (!) 310 lb (140.6 kg)  ? ? ?Diabetic Foot Exam - Simple   ?No data filed ?  ? ?Lab Results  ?Component Value Date  ? WBC 5.6

## 2021-11-22 ENCOUNTER — Ambulatory Visit (INDEPENDENT_AMBULATORY_CARE_PROVIDER_SITE_OTHER): Payer: Medicare Other | Admitting: Family Medicine

## 2021-11-22 ENCOUNTER — Encounter: Payer: Self-pay | Admitting: Family Medicine

## 2021-11-22 VITALS — BP 110/70 | HR 71 | Resp 20 | Ht 73.0 in | Wt 300.4 lb

## 2021-11-22 DIAGNOSIS — F410 Panic disorder [episodic paroxysmal anxiety] without agoraphobia: Secondary | ICD-10-CM

## 2021-11-22 DIAGNOSIS — I2510 Atherosclerotic heart disease of native coronary artery without angina pectoris: Secondary | ICD-10-CM

## 2021-11-22 DIAGNOSIS — I509 Heart failure, unspecified: Secondary | ICD-10-CM

## 2021-11-22 DIAGNOSIS — R739 Hyperglycemia, unspecified: Secondary | ICD-10-CM | POA: Diagnosis not present

## 2021-11-22 DIAGNOSIS — C61 Malignant neoplasm of prostate: Secondary | ICD-10-CM | POA: Diagnosis not present

## 2021-11-22 DIAGNOSIS — E785 Hyperlipidemia, unspecified: Secondary | ICD-10-CM | POA: Diagnosis not present

## 2021-11-22 DIAGNOSIS — I10 Essential (primary) hypertension: Secondary | ICD-10-CM | POA: Diagnosis not present

## 2021-11-22 DIAGNOSIS — Z0000 Encounter for general adult medical examination without abnormal findings: Secondary | ICD-10-CM

## 2021-11-22 LAB — HEMOGLOBIN A1C: Hgb A1c MFr Bld: 5.7 % (ref 4.6–6.5)

## 2021-11-22 LAB — CBC
HCT: 41.1 % (ref 39.0–52.0)
Hemoglobin: 13.9 g/dL (ref 13.0–17.0)
MCHC: 33.7 g/dL (ref 30.0–36.0)
MCV: 89.3 fl (ref 78.0–100.0)
Platelets: 279 10*3/uL (ref 150.0–400.0)
RBC: 4.6 Mil/uL (ref 4.22–5.81)
RDW: 13.6 % (ref 11.5–15.5)
WBC: 6.2 10*3/uL (ref 4.0–10.5)

## 2021-11-22 LAB — COMPREHENSIVE METABOLIC PANEL
ALT: 15 U/L (ref 0–53)
AST: 17 U/L (ref 0–37)
Albumin: 4.2 g/dL (ref 3.5–5.2)
Alkaline Phosphatase: 61 U/L (ref 39–117)
BUN: 11 mg/dL (ref 6–23)
CO2: 30 mEq/L (ref 19–32)
Calcium: 9.7 mg/dL (ref 8.4–10.5)
Chloride: 100 mEq/L (ref 96–112)
Creatinine, Ser: 1.18 mg/dL (ref 0.40–1.50)
GFR: 58.59 mL/min — ABNORMAL LOW (ref >60.00)
Glucose, Bld: 130 mg/dL — ABNORMAL HIGH (ref 70–99)
Potassium: 3.4 mEq/L — ABNORMAL LOW (ref 3.5–5.1)
Sodium: 139 mEq/L (ref 135–145)
Total Bilirubin: 1.6 mg/dL — ABNORMAL HIGH (ref 0.2–1.2)
Total Protein: 6.8 g/dL (ref 6.0–8.3)

## 2021-11-22 LAB — LIPID PANEL
Cholesterol: 155 mg/dL (ref 0–200)
HDL: 33 mg/dL — ABNORMAL LOW (ref >39.00)
LDL Cholesterol: 88 mg/dL (ref 0–99)
NonHDL: 121.73
Total CHOL/HDL Ratio: 5
Triglycerides: 169 mg/dL — ABNORMAL HIGH (ref 0.0–149.0)
VLDL: 33.8 mg/dL (ref 0.0–40.0)

## 2021-11-22 LAB — TSH: TSH: 1.62 u[IU]/mL (ref 0.35–5.50)

## 2021-11-22 NOTE — Assessment & Plan Note (Signed)
Encourage heart healthy diet such as MIND or DASH diet, increase exercise, avoid trans fats, simple carbohydrates and processed foods, consider a krill or fish or flaxseed oil cap daily.  °

## 2021-11-22 NOTE — Patient Instructions (Signed)

## 2021-11-22 NOTE — Assessment & Plan Note (Signed)
Well controlled, no changes to meds. Encouraged heart healthy diet such as the DASH diet and exercise as tolerated.  °

## 2021-11-22 NOTE — Assessment & Plan Note (Signed)
Tolerated his treatment and is feeling better now.  ?

## 2021-11-22 NOTE — Assessment & Plan Note (Addendum)
Encouraged DASH or MIND diet, decrease po intake and increase exercise as tolerated. Needs 7-8 hours of sleep nightly. Avoid trans fats, eat small, frequent meals every 4-5 hours with lean proteins, complex carbs and healthy fats. Minimize simple carbs, high fat foods and processed foods. He has been walking more and eating better so has had some decent weight loss, encouraged to continue the same.  ?

## 2021-11-22 NOTE — Assessment & Plan Note (Signed)
hgba1c acceptable, minimize simple carbs. Increase exercise as tolerated.  

## 2021-11-22 NOTE — Assessment & Plan Note (Signed)
No recent exacerbations.  

## 2021-11-22 NOTE — Progress Notes (Signed)
? ?Subjective:  ? ?By signing my name below, I, [name], attest that this documentation has been prepared under the direction and in the presence of [name] MD, 11/22/2021   ? ? Patient ID: David Parsons, male    DOB: Jan 12, 1941, 81 y.o.   MRN: [id number] ? ?Chief Complaint  ?Patient presents with  ? Follow-up  ? ? ?HPI ?Patient is in today for an office visit.  ? ?He reports no recent febrile illness or hospitalizations. He has been well.  ? ?As of today's visit, His weight is decreasing. He has been regularly walking and implementing a healthier diet. He reports to have a restful sleep schedule. Denies CP/palp/SOB/HA/congestion/fevers/GI or GU c/o. Taking meds as prescribed  ?Wt Readings from Last 3 Encounters:  ?11/22/21 (!) 300 lb 6.4 oz (136.3 kg)  ?11/10/21 (!) 305 lb (138.3 kg)  ?04/11/21 (!) 310 lb (140.6 kg)  ? ?As of today's visit, his A1C levels are good.  ?Lab Results  ?Component Value Date  ? HGBA1C 5.4 04/11/2021  ? ?He reports that he has completed his Shingles vaccine.  ? ?He denies CP/palp/SOB/HA/congestion/fevers/GI or GU c/o. Taking meds as prescribed  ? ?Past Medical History:  ?Diagnosis Date  ? Aortic insufficiency   ?  02/16/09 Chest CT:  The aortic root has a diameter measuring 4.6 cm, image 64 of the coronal series.  The ascending    thoracic aorta has a diameter of 3.8 cm, image 65 of the coronal series.  At the level of the aortic arch    the thoracic aorta has a maximum diameter of 3.2 cm.  The descending thoracic aorta has a maximum           diameter of 3.2 cm.  There is no evidence for aortic disse        ? 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 The aortic root has a diameter measuring 4.6 cm, image 64 of the coronal series.  The ascending  thoracic aorta has a diameter of 3.8 cm, image 65 of the coronal series.  At the level of the aortic arch   the thoracic aorta has a maximum diameter of 3.2 cm.  The descending thoracic aorta has a maximum   diameter of 3.2 cm.  There is no  evidence for aortic dissection.       ? CAD (coronary artery disease)   ? Cardiomyopathy   ?  01/2009 - Cath - nonobs. dzs.  EF 35%.  ? CHF (congestive heart failure) ([place])   ? Diabetes mellitus   ? Dyslipidemia 01/14/2013  ? Gallstones   ? s/p cholecystectomy    ? GERD (gastroesophageal reflux disease)   ? Glaucoma 10/05/2016  ? Heart murmur   ? Helicobacter pylori gastritis   ? (Tx Pylera 4/10)  ? Hypertension   ? Hypokalemia   ? Iron deficiency anemia   ? Malignant neoplasm of prostate ([place]) 02/03/2021  ? Obesity, Class III, BMI 40-49.9 (morbid obesity) ([place])   ? Onychomycosis 03/09/2015  ? PONV (postoperative nausea and vomiting)   ? 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 Biological Bentall Aortic Root Replacement using 25 mm Edwards Magna Ease pericardial tissue valve and 28 mm Vascutek Gelweave Valsalva conduit with reimplantation of left main and right coronary artery and CABG x1 using SVG to RCA  ? Sleep apnea   ? occasionally wears CPAP at night  ? ? ?Past Surgical History:  ?Procedure Laterality Date  ? ADRENALECTOMY    ?  BENTALL PROCEDURE  12/28/2011  ? Procedure: BENTALL PROCEDURE;  Surgeon: Rexene Alberts, MD;  Location: [place];  Service: Open Heart Surgery;  Laterality: N/A;  ? CARDIAC CATHETERIZATION    ? 2013  ? CARDIAC VALVE REPLACEMENT  12/28/2011  ? Bentall with tissue valve  ? CHOLECYSTECTOMY    ? COLONOSCOPY  09/25/2011  ? COLONOSCOPY W/ BIOPSIES AND POLYPECTOMY  11/04/2008  ? colon polyps, diverticulosis   ? CORONARY ARTERY BYPASS GRAFT  12/28/2011  ? Procedure: CORONARY ARTERY BYPASS GRAFTING (CABG);  Surgeon: Rexene Alberts, MD;  Location: [place];  Service: Open Heart Surgery;  Laterality: N/A;  cabg x 1  ? LEFT AND RIGHT HEART CATHETERIZATION WITH CORONARY ANGIOGRAM N/A 11/22/2011  ? Procedure: LEFT AND RIGHT HEART CATHETERIZATION WITH CORONARY ANGIOGRAM;  Surgeon: Burnell Blanks, MD;  Location: [facility] CATH LAB;  Service: Cardiovascular;  Laterality: N/A;  ? MULTIPLE TOOTH  EXTRACTIONS    ? PROSTATE BIOPSY Bilateral   ? TEE WITHOUT CARDIOVERSION  11/22/2011  ? Procedure: TRANSESOPHAGEAL ECHOCARDIOGRAM (TEE);  Surgeon: Larey Dresser, MD;  Location: [place];  Service: Cardiovascular;  Laterality: N/A;  ? UPPER GASTROINTESTINAL ENDOSCOPY  11/04/2008  ? w/bx, H pylori gastritis  ? UPPER GASTROINTESTINAL ENDOSCOPY  09/25/2011  ? ? ?Family History  ?Problem Relation Age of Onset  ? Breast cancer Sister   ? Diabetes Sister   ? Colon cancer Brother 35  ?     at least 6  ? Diabetes Brother   ? Cancer Brother   ?     colon, prostate  ? Prostate cancer Brother   ? Diabetes Brother   ? Diabetes Sister   ? Cancer Sister   ?     breast, colon, melanoma  ? Heart disease Sister   ? Hypertension Mother   ? Arthritis Mother   ? Kidney failure Sister   ? Colon cancer Sister   ? Breast cancer Sister   ? Kidney disease Sister   ?     s/p transplant  ? Hypertension Sister   ? Diabetes Sister   ? Stroke Father   ? Hypertension Father   ? ? ?Social History  ? ?Socioeconomic History  ? Marital status: Married  ?  Spouse name: [name]  ? Number of children: 0  ? Years of education: Not on file  ? Highest education level: Not on file  ?Occupational History  ?  Employer: [employer]  ?  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 Smoking status: Never  ? Smokeless tobacco: Never  ?Vaping Use  ? Vaping Use: Never used  ?Substance and Sexual Activity  ? Alcohol use: No  ? Drug use: No  ? Sexual activity: Not Currently  ?  Comment: lives with wife, [name] churches  ?Other Topics Concern  ? Not on file  ?Social History Narrative  ? The patient is married, lives with his wife in  [place].  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  He  lives a very sedentary lifestyle.  He is a nonsmoker.  He denies alcohol consumption.   ? ?Social Determinants of Health  ? ?Financial Resource Strain: Low Risk   ? Difficulty of Paying Living Expenses: Not very hard  ?Food Insecurity: No  Food Insecurity  ? Worried About Charity fundraiser in the Last Year: Never true  ? Ran Out of Food in the Last Year: Never true  ?Transportation Needs:  No Transportation Needs  ? Lack of Transportation (Medical): No  ? Lack of Transportation (Non-Medical): No  ?Physical Activity: Sufficiently Active  ? Days of Exercise per Week: 6 days  ? Minutes of Exercise per Session: 40 min  ?Stress: No Stress Concern Present  ? Feeling of Stress : Not at all  ?Social Connections: Socially Integrated  ? Frequency of Communication with Friends and Family: More than three times a week  ? Frequency of Social Gatherings with Friends and Family: Three times a week  ? Attends Religious Services: More than 4 times per year  ? Active Member of Clubs or Organizations: Yes  ? Attends Archivist Meetings: More than 4 times per year  ? Marital Status: Married  ?Intimate Partner Violence: Not At Risk  ? Fear of Current or Ex-Partner: No  ? Emotionally Abused: No  ? Physically Abused: No  ? Sexually Abused: No  ? ? ?Outpatient Medications Prior to Visit  ?Medication Sig Dispense Refill  ? amLODipine (NORVASC) 10 MG tablet TAKE 1 TABLET BY MOUTH EVERY DAY 90 tablet 1  ? aspirin EC 81 MG tablet Take 81 mg by mouth daily.    ? bimatoprost (LUMIGAN) 0.01 % SOLN Place 1 drop into both eyes at bedtime.    ? carvedilol (COREG) 25 MG tablet TAKE 1 TABLET BY MOUTH TWICE A DAY WITH FOOD 180 tablet 1  ? Cholecalciferol (VITAMIN D3) 50 MCG (2000 UT) CAPS Take 1 capsule by mouth daily.    ? clobetasol ointment (TEMOVATE) 6.22 % Apply 1 application topically 2 (two) times daily as needed.  0  ? diazepam (VALIUM) 5 MG tablet TAKE 1/2-2 TABLETS BY MOUTH DAILY AS NEEDED FOR ANXIETY. 10 tablet 0  ? ferrous sulfate 325 (65 FE) MG tablet TAKE 1 TABLET BY MOUTH 2 TIMES DAILY WITH A MEAL. 180 tablet 2  ? furosemide (LASIX) 40 MG tablet TAKE 1 TABLET BY MOUTH 2 TIMES DAILY. PLEASE KEEP UPCOMING APPT IN [id number] BEFORE ANYMORE REFILLS 60 tablet 0  ?  KLOR-CON M20 20 MEQ tablet TAKE 3 TABLETS (60 MEQ TOTAL) BY MOUTH 3 (THREE) TIMES DAILY. 810 tablet 1  ? losartan (COZAAR) 100 MG tablet Take 1 tablet (100 mg total) by mouth daily. 90 tablet 0  ? Multiple V

## 2021-11-24 ENCOUNTER — Other Ambulatory Visit: Payer: Self-pay | Admitting: Family Medicine

## 2021-11-24 ENCOUNTER — Other Ambulatory Visit: Payer: Self-pay | Admitting: Cardiovascular Disease

## 2021-11-24 DIAGNOSIS — I5033 Acute on chronic diastolic (congestive) heart failure: Secondary | ICD-10-CM

## 2021-11-29 ENCOUNTER — Ambulatory Visit (HOSPITAL_COMMUNITY): Payer: Medicare Other | Attending: Cardiology

## 2021-11-29 DIAGNOSIS — I2510 Atherosclerotic heart disease of native coronary artery without angina pectoris: Secondary | ICD-10-CM | POA: Insufficient documentation

## 2021-11-29 DIAGNOSIS — I359 Nonrheumatic aortic valve disorder, unspecified: Secondary | ICD-10-CM | POA: Diagnosis not present

## 2021-11-29 DIAGNOSIS — I10 Essential (primary) hypertension: Secondary | ICD-10-CM | POA: Diagnosis not present

## 2021-11-29 DIAGNOSIS — I5032 Chronic diastolic (congestive) heart failure: Secondary | ICD-10-CM | POA: Insufficient documentation

## 2021-11-29 LAB — ECHOCARDIOGRAM COMPLETE
AV Mean grad: 10 mmHg
AV Peak grad: 19.4 mmHg
Ao pk vel: 2.2 m/s
Area-P 1/2: 2.63 cm2
S' Lateral: 3.2 cm

## 2021-11-29 MED ORDER — PERFLUTREN LIPID MICROSPHERE
3.0000 mL | INTRAVENOUS | Status: AC | PRN
  Administered 2021-11-29: 3 mL via INTRAVENOUS

## 2021-12-05 ENCOUNTER — Other Ambulatory Visit (HOSPITAL_BASED_OUTPATIENT_CLINIC_OR_DEPARTMENT_OTHER): Payer: Self-pay

## 2021-12-05 ENCOUNTER — Ambulatory Visit (INDEPENDENT_AMBULATORY_CARE_PROVIDER_SITE_OTHER): Payer: Medicare Other | Admitting: Family Medicine

## 2021-12-05 ENCOUNTER — Encounter: Payer: Self-pay | Admitting: Family Medicine

## 2021-12-05 VITALS — BP 131/76 | HR 73 | Ht 73.0 in | Wt 294.8 lb

## 2021-12-05 DIAGNOSIS — B370 Candidal stomatitis: Secondary | ICD-10-CM | POA: Diagnosis not present

## 2021-12-05 MED ORDER — NYSTATIN 100000 UNIT/ML MT SUSP
Freq: Two times a day (BID) | OROMUCOSAL | 1 refills | Status: DC
  Filled 2021-12-05: qty 240, 12d supply, fill #0

## 2021-12-05 MED ORDER — MAGIC MOUTHWASH: 5.0000 mL | Freq: Four times a day (QID) | ORAL | 1 refills | Status: DC | PRN

## 2021-12-05 NOTE — Progress Notes (Signed)
? ?Acute Office Visit ? ?Subjective:  ? ? Patient ID: David Parsons, male    DOB: [birth year], 81 y.o.   MRN: [id number] ? ?CC: white coating on tongue ? ? ?HPI ?Patient is in today for white coating on tongue.  ? ?Patient reports that about 2 weeks ago he had some teeth pulled at the dentist and ended up having a few days of difficulty eating due to soreness.  States he was not on any antibiotics at the time.  Reports that about a week ago he started to notice a white coating on his tongue along with decreased ability to taste normally.  He denies any pain/burning, difficulty swallowing/breathing, recent antibiotics, inhalers..  Reports that he had something similar a very long time ago and remembers doing an oral rinse but cannot remember what it was. ? ? ? ?Past Medical History:  ?Diagnosis Date  ? Aortic insufficiency   ?  02/16/09 Chest CT:  The aortic root has a diameter measuring 4.6 cm, image 64 of the coronal series.  The ascending    thoracic aorta has a diameter of 3.8 cm, image 65 of the coronal series.  At the level of the aortic arch    the thoracic aorta has a maximum diameter of 3.2 cm.  The descending thoracic aorta has a maximum           diameter of 3.2 cm.  There is no evidence for aortic disse        ? 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 The aortic root has a diameter measuring 4.6 cm, image 64 of the coronal series.  The ascending  thoracic aorta has a diameter of 3.8 cm, image 65 of the coronal series.  At the level of the aortic arch   the thoracic aorta has a maximum diameter of 3.2 cm.  The descending thoracic aorta has a maximum   diameter of 3.2 cm.  There is no evidence for aortic dissection.       ? CAD (coronary artery disease)   ? Cardiomyopathy   ?  01/2009 - Cath - nonobs. dzs.  EF 35%.  ? CHF (congestive heart failure) ([place])   ? Diabetes mellitus   ? Dyslipidemia 01/14/2013  ? Gallstones   ? s/p cholecystectomy    ? GERD (gastroesophageal reflux disease)   ? Glaucoma 10/05/2016  ? Heart  murmur   ? Helicobacter pylori gastritis   ? (Tx Pylera 4/10)  ? Hypertension   ? Hypokalemia   ? Iron deficiency anemia   ? Malignant neoplasm of prostate ([place]) 02/03/2021  ? Obesity, Class III, BMI 40-49.9 (morbid obesity) ([place])   ? Onychomycosis 03/09/2015  ? PONV (postoperative nausea and vomiting)   ? 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 Biological Bentall Aortic Root Replacement using 25 mm Edwards Magna Ease pericardial tissue valve and 28 mm Vascutek Gelweave Valsalva conduit with reimplantation of left main and right coronary artery and CABG x1 using SVG to RCA  ? Sleep apnea   ? occasionally wears CPAP at night  ? ? ?Past Surgical History:  ?Procedure Laterality Date  ? ADRENALECTOMY    ? BENTALL PROCEDURE  12/28/2011  ? Procedure: BENTALL PROCEDURE;  Surgeon: Rexene Alberts, MD;  Location: [place];  Service: Open Heart Surgery;  Laterality: N/A;  ? CARDIAC CATHETERIZATION    ? 2013  ? CARDIAC VALVE REPLACEMENT  12/28/2011  ? Bentall with tissue valve  ? CHOLECYSTECTOMY    ?  COLONOSCOPY  09/25/2011  ? COLONOSCOPY W/ BIOPSIES AND POLYPECTOMY  11/04/2008  ? colon polyps, diverticulosis   ? CORONARY ARTERY BYPASS GRAFT  12/28/2011  ? Procedure: CORONARY ARTERY BYPASS GRAFTING (CABG);  Surgeon: Rexene Alberts, MD;  Location: [place];  Service: Open Heart Surgery;  Laterality: N/A;  cabg x 1  ? LEFT AND RIGHT HEART CATHETERIZATION WITH CORONARY ANGIOGRAM N/A 11/22/2011  ? Procedure: LEFT AND RIGHT HEART CATHETERIZATION WITH CORONARY ANGIOGRAM;  Surgeon: Burnell Blanks, MD;  Location: [facility] CATH LAB;  Service: Cardiovascular;  Laterality: N/A;  ? MULTIPLE TOOTH EXTRACTIONS    ? PROSTATE BIOPSY Bilateral   ? TEE WITHOUT CARDIOVERSION  11/22/2011  ? Procedure: TRANSESOPHAGEAL ECHOCARDIOGRAM (TEE);  Surgeon: Larey Dresser, MD;  Location: [place];  Service: Cardiovascular;  Laterality: N/A;  ? UPPER GASTROINTESTINAL ENDOSCOPY  11/04/2008  ? w/bx, H pylori gastritis  ? UPPER  GASTROINTESTINAL ENDOSCOPY  09/25/2011  ? ? ?Family History  ?Problem Relation Age of Onset  ? Breast cancer Sister   ? Diabetes Sister   ? Colon cancer Brother 76  ?     at least 54  ? Diabetes Brother   ? Cancer Brother   ?     colon, prostate  ? Prostate cancer Brother   ? Diabetes Brother   ? Diabetes Sister   ? Cancer Sister   ?     breast, colon, melanoma  ? Heart disease Sister   ? Hypertension Mother   ? Arthritis Mother   ? Kidney failure Sister   ? Colon cancer Sister   ? Breast cancer Sister   ? Kidney disease Sister   ?     s/p transplant  ? Hypertension Sister   ? Diabetes Sister   ? Stroke Father   ? Hypertension Father   ? ? ?Social History  ? ?Socioeconomic History  ? Marital status: Married  ?  Spouse name: [name]  ? Number of children: 0  ? Years of education: Not on file  ? Highest education level: Not on file  ?Occupational History  ?  Employer: [employer]  ?  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 Smoking status: Never  ? Smokeless tobacco: Never  ?Vaping Use  ? Vaping Use: Never used  ?Substance and Sexual Activity  ? Alcohol use: No  ? Drug use: No  ? Sexual activity: Not Currently  ?  Comment: lives with wife, [name] churches  ?Other Topics Concern  ? Not on file  ?Social History Narrative  ? The patient is married, lives with his wife in  [place].  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  He  lives a very sedentary lifestyle.  He is a nonsmoker.  He denies alcohol consumption.   ? ?Social Determinants of Health  ? ?Financial Resource Strain: Low Risk   ? Difficulty of Paying Living Expenses: Not very hard  ?Food Insecurity: No Food Insecurity  ? Worried About Charity fundraiser in the Last Year: Never true  ? Ran Out of Food in the Last Year: Never true  ?Transportation Needs: No Transportation Needs  ? Lack of Transportation (Medical): No  ? Lack of Transportation (Non-Medical): No  ?Physical Activity: Sufficiently Active  ? Days of Exercise  per Week: 6 days  ? Minutes of Exercise per Session: 40 min  ?Stress: No Stress Concern Present  ? Feeling of Stress : Not at all  ?  Social Connections: Socially Integrated  ? Frequency of Communication with Friends and Family: More than three times a week  ? Frequency of Social Gatherings with Friends and Family: Three times a week  ? Attends Religious Services: More than 4 times per year  ? Active Member of Clubs or Organizations: Yes  ? Attends Archivist Meetings: More than 4 times per year  ? Marital Status: Married  ?Intimate Partner Violence: Not At Risk  ? Fear of Current or Ex-Partner: No  ? Emotionally Abused: No  ? Physically Abused: No  ? Sexually Abused: No  ? ? ?Outpatient Medications Prior to Visit  ?Medication Sig Dispense Refill  ? amLODipine (NORVASC) 10 MG tablet TAKE 1 TABLET BY MOUTH EVERY DAY 90 tablet 1  ? aspirin EC 81 MG tablet Take 81 mg by mouth daily.    ? bimatoprost (LUMIGAN) 0.01 % SOLN Place 1 drop into both eyes at bedtime.    ? carvedilol (COREG) 25 MG tablet TAKE 1 TABLET BY MOUTH TWICE A DAY WITH FOOD 180 tablet 1  ? Cholecalciferol (VITAMIN D3) 50 MCG (2000 UT) CAPS Take 1 capsule by mouth daily.    ? clobetasol ointment (TEMOVATE) 5.88 % Apply 1 application topically 2 (two) times daily as needed.  0  ? diazepam (VALIUM) 5 MG tablet TAKE 1/2-2 TABLETS BY MOUTH DAILY AS NEEDED FOR ANXIETY. 10 tablet 0  ? ferrous sulfate 325 (65 FE) MG tablet TAKE 1 TABLET BY MOUTH 2 TIMES DAILY WITH A MEAL. 180 tablet 2  ? furosemide (LASIX) 40 MG tablet Take 1 tablet (40 mg total) by mouth daily. 180 tablet 3  ? KLOR-CON M20 20 MEQ tablet TAKE 3 TABLETS (60 MEQ TOTAL) BY MOUTH 3 (THREE) TIMES DAILY. 810 tablet 1  ? latanoprost (XALATAN) 0.005 % ophthalmic solution 1 drop at bedtime.    ? losartan (COZAAR) 100 MG tablet TAKE 1 TABLET BY MOUTH EVERY DAY 90 tablet 0  ? Multiple Vitamin (MULITIVITAMIN WITH MINERALS) TABS Take 1 tablet by mouth daily.    ? omeprazole (PRILOSEC) 40 MG capsule  TAKE 1 CAPSULE BY MOUTH EVERY DAY 90 capsule 1  ? tamsulosin (FLOMAX) 0.4 MG CAPS capsule Take 1 capsule (0.4 mg total) by mouth daily. 30 capsule 3  ? timolol (TIMOPTIC) 0.5 % ophthalmic solution INSTILL 1 DROP I

## 2021-12-05 NOTE — Patient Instructions (Signed)
Looks like thrush - let's try magic mouthwash and consider adding a probiotic to your daily routine. Follow-up if not improving.  ?

## 2021-12-07 ENCOUNTER — Telehealth: Payer: Self-pay | Admitting: Family Medicine

## 2021-12-07 NOTE — Telephone Encounter (Signed)
Pt's wife would like advice on pt's symptoms, white coating on tongue, no appetite or taste. Please advise.  ?

## 2021-12-07 NOTE — Telephone Encounter (Signed)
Advised pt's wife to continue magic mouth wash and have husband come in for an appointment. Pt's wife states he doesn't want to come in for an appointment. Advised pt's wife to seek emergency care if pt's symptoms worsen.  ?

## 2021-12-13 NOTE — Telephone Encounter (Signed)
Spoke to pt's wife. Pt agreed to come in for an appointment. Scheduled for tomorrow at 4 with Dr. Larose Kells. ?

## 2021-12-13 NOTE — Telephone Encounter (Signed)
Pt's wife would like advice on pt's symptoms. Pt still has white coating on tongue, little to no appetite, pt has been drinking ensures and/or protein shakes. Please advise.  ?

## 2021-12-14 ENCOUNTER — Encounter: Payer: Self-pay | Admitting: Podiatry

## 2021-12-14 ENCOUNTER — Ambulatory Visit (INDEPENDENT_AMBULATORY_CARE_PROVIDER_SITE_OTHER): Payer: Medicare Other | Admitting: Podiatry

## 2021-12-14 ENCOUNTER — Encounter: Payer: Self-pay | Admitting: Internal Medicine

## 2021-12-14 ENCOUNTER — Ambulatory Visit (INDEPENDENT_AMBULATORY_CARE_PROVIDER_SITE_OTHER): Payer: Medicare Other | Admitting: Internal Medicine

## 2021-12-14 VITALS — BP 126/74 | HR 68 | Temp 98.2°F | Resp 18 | Ht 73.0 in | Wt 295.2 lb

## 2021-12-14 DIAGNOSIS — B351 Tinea unguium: Secondary | ICD-10-CM

## 2021-12-14 DIAGNOSIS — M79674 Pain in right toe(s): Secondary | ICD-10-CM

## 2021-12-14 DIAGNOSIS — M79675 Pain in left toe(s): Secondary | ICD-10-CM

## 2021-12-14 DIAGNOSIS — I2510 Atherosclerotic heart disease of native coronary artery without angina pectoris: Secondary | ICD-10-CM

## 2021-12-14 DIAGNOSIS — B370 Candidal stomatitis: Secondary | ICD-10-CM | POA: Diagnosis not present

## 2021-12-14 MED ORDER — NYSTATIN 100000 UNIT/ML MT SUSP: 5.0000 mL | Freq: Four times a day (QID) | OROMUCOSAL | 0 refills | Status: DC

## 2021-12-14 NOTE — Progress Notes (Signed)
? ?Subjective:  ? ? Patient ID: David Parsons, male    DOB: 03-Aug-1941, 81 y.o.   MRN: [id number] ? ?DOS:  12/14/2021 ?Type of visit - description: Acute ? ?Symptoms a started 11/28/2020 shortly after he had some dental work. ?Reported white patches on his tongue and poor sense of taste. ? ?Was seen here, he was felt to have thrush.  Was prescribed Magic mouthwash. ? ?He is here because although better, his tongue still looks white to him. ? ? ?Review of Systems ?Denies fever chills ?No recent URI type of symptoms ?No GERD ?No dysphagia or odynophagia. ?No recent antibiotics ? ?Past Medical History:  ?Diagnosis Date  ? Aortic insufficiency   ?  02/16/09 Chest CT:  The aortic root has a diameter measuring 4.6 cm, image 64 of the coronal series.  The ascending    thoracic aorta has a diameter of 3.8 cm, image 65 of the coronal series.  At the level of the aortic arch    the thoracic aorta has a maximum diameter of 3.2 cm.  The descending thoracic aorta has a maximum           diameter of 3.2 cm.  There is no evidence for aortic disse        ? 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 The aortic root has a diameter measuring 4.6 cm, image 64 of the coronal series.  The ascending  thoracic aorta has a diameter of 3.8 cm, image 65 of the coronal series.  At the level of the aortic arch   the thoracic aorta has a maximum diameter of 3.2 cm.  The descending thoracic aorta has a maximum   diameter of 3.2 cm.  There is no evidence for aortic dissection.       ? CAD (coronary artery disease)   ? Cardiomyopathy   ?  01/2009 - Cath - nonobs. dzs.  EF 35%.  ? CHF (congestive heart failure) ([place])   ? Diabetes mellitus   ? Dyslipidemia 01/14/2013  ? Gallstones   ? s/p cholecystectomy    ? GERD (gastroesophageal reflux disease)   ? Glaucoma 10/05/2016  ? Heart murmur   ? Helicobacter pylori gastritis   ? (Tx Pylera 4/10)  ? Hypertension   ? Hypokalemia   ? Iron deficiency anemia   ? Malignant neoplasm of prostate ([place]) 02/03/2021  ? Obesity,  Class III, BMI 40-49.9 (morbid obesity) ([place])   ? Onychomycosis 03/09/2015  ? PONV (postoperative nausea and vomiting)   ? 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 Biological Bentall Aortic Root Replacement using 25 mm Edwards Magna Ease pericardial tissue valve and 28 mm Vascutek Gelweave Valsalva conduit with reimplantation of left main and right coronary artery and CABG x1 using SVG to RCA  ? Sleep apnea   ? occasionally wears CPAP at night  ? ? ?Past Surgical History:  ?Procedure Laterality Date  ? ADRENALECTOMY    ? BENTALL PROCEDURE  12/28/2011  ? Procedure: BENTALL PROCEDURE;  Surgeon: Rexene Alberts, MD;  Location: [place];  Service: Open Heart Surgery;  Laterality: N/A;  ? CARDIAC CATHETERIZATION    ? 2013  ? CARDIAC VALVE REPLACEMENT  12/28/2011  ? Bentall with tissue valve  ? CHOLECYSTECTOMY    ? COLONOSCOPY  09/25/2011  ? COLONOSCOPY W/ BIOPSIES AND POLYPECTOMY  11/04/2008  ? colon polyps, diverticulosis   ? CORONARY ARTERY BYPASS GRAFT  12/28/2011  ? Procedure: CORONARY ARTERY BYPASS GRAFTING (CABG);  Surgeon: Braulio Conte  Keturah Barre, MD;  Location: [place];  Service: Open Heart Surgery;  Laterality: N/A;  cabg x 1  ? LEFT AND RIGHT HEART CATHETERIZATION WITH CORONARY ANGIOGRAM N/A 11/22/2011  ? Procedure: LEFT AND RIGHT HEART CATHETERIZATION WITH CORONARY ANGIOGRAM;  Surgeon: Burnell Blanks, MD;  Location: [facility] CATH LAB;  Service: Cardiovascular;  Laterality: N/A;  ? MULTIPLE TOOTH EXTRACTIONS    ? PROSTATE BIOPSY Bilateral   ? TEE WITHOUT CARDIOVERSION  11/22/2011  ? Procedure: TRANSESOPHAGEAL ECHOCARDIOGRAM (TEE);  Surgeon: Larey Dresser, MD;  Location: [place];  Service: Cardiovascular;  Laterality: N/A;  ? UPPER GASTROINTESTINAL ENDOSCOPY  11/04/2008  ? w/bx, H pylori gastritis  ? UPPER GASTROINTESTINAL ENDOSCOPY  09/25/2011  ? ? ?Current Outpatient Medications  ?Medication Instructions  ? amLODipine (NORVASC) 10 MG tablet TAKE 1 TABLET BY MOUTH EVERY DAY  ? aspirin EC 81 mg, Daily   ? bimatoprost (LUMIGAN) 0.01 % SOLN 1 drop, Both Eyes, Daily at bedtime  ? carvedilol (COREG) 25 MG tablet TAKE 1 TABLET BY MOUTH TWICE A DAY WITH FOOD  ? Cholecalciferol (VITAMIN D3) 50 MCG (2000 UT) CAPS 1 capsule, Oral, Daily  ? clobetasol ointment (TEMOVATE) 9.62 % 1 application., Topical, 2 times daily PRN  ? diazepam (VALIUM) 5 MG tablet TAKE 1/2-2 TABLETS BY MOUTH DAILY AS NEEDED FOR ANXIETY.  ? ferrous sulfate 325 (65 FE) MG tablet TAKE 1 TABLET BY MOUTH 2 TIMES DAILY WITH A MEAL.  ? furosemide (LASIX) 40 mg, Oral, Daily  ? KLOR-CON M20 20 MEQ tablet TAKE 3 TABLETS (60 MEQ TOTAL) BY MOUTH 3 (THREE) TIMES DAILY.  ? latanoprost (XALATAN) 0.005 % ophthalmic solution 1 drop, Daily at bedtime  ? losartan (COZAAR) 100 MG tablet TAKE 1 TABLET BY MOUTH EVERY DAY  ? magic mouthwash SOLN 5 mLs, Oral, 4 times daily PRN, Swish and spit  ? Multiple Vitamin (MULITIVITAMIN WITH MINERALS) TABS 1 tablet, Daily  ? nystatin (MYCOSTATIN) 500,000 Units, Oral, 4 times daily  ? omeprazole (PRILOSEC) 40 MG capsule TAKE 1 CAPSULE BY MOUTH EVERY DAY  ? Probiotic Product (PROBIOTIC DAILY PO) Oral  ? tamsulosin (FLOMAX) 0.4 mg, Oral, Daily  ? timolol (TIMOPTIC) 0.5 % ophthalmic solution INSTILL 1 DROP INTO BOTH EYES TWICE A DAY  ? triamterene-hydrochlorothiazide (MAXZIDE-25) 37.5-25 MG tablet TAKE 1 TABLET BY MOUTH EVERY DAY  ? ? ?   ?Objective:  ? Physical Exam ?BP 126/74 (BP Location: Left Arm, Patient Position: Sitting, Cuff Size: Normal)   Pulse 68   Temp 98.2 ?F (36.8 ?C) (Oral)   Resp 18   Ht '6\' 1"'$  (1.854 m)   Wt 295 lb 4 oz (133.9 kg)   SpO2 97%   BMI 38.95 kg/m?  ?General:   ?Well developed, NAD, BMI noted. ?HEENT:  ?Normocephalic . Face symmetric, atraumatic ?Throat: Symmetric, not red.  Few low dental pieces , has a upper plate. ?Tongue: No ulcers, no blisters, surfaces is slightly white  but no clear-cut cotton type of lesions. ?Neck: Symmetric with no obvious lymphadenopathies ?Skin: Not pale. Not  jaundice ?Neurologic:  ?alert & oriented X3.  ?Speech normal, gait appropriate for age and unassisted ?Psych--  ?Cognition and judgment appear intact.  ?Cooperative with normal attention span and concentration.  ?Behavior appropriate. ?No anxious or depressed appearing.  ? ?   ?Assessment   ? ? 81 year old patient. ?History of CAD, cardiomyopathy, dyslipidemia, hypertension, among other conditions, presents for the following ? ?Partially treated thrush? ?Although today exam is not very impressive, he may have partially  treated thrush. ?Recommend 5 days use of nystatin. ?Prescription sent. ?Call if not better. ? ?   ?

## 2021-12-14 NOTE — Patient Instructions (Signed)
Use the new liquid: Nystatin 4 times a day for 5 days, swish and spit. ? ?If you are not much better in the next few days let us know ?

## 2021-12-15 NOTE — Telephone Encounter (Signed)
Patient's wife is calling back stating the medication given to her husband on 04/26 is making him dizzy. She declined an OV, and would like advice on what to do. Please advise.  ?

## 2021-12-16 NOTE — Telephone Encounter (Signed)
Spoke w/ Pt- he states initial dizziness with first few doses- but doing better today and w/ subsequent doses- however I still informed him of Dr. Ethel Rana recommendations. Pt verbalized understanding.  ?

## 2021-12-16 NOTE — Telephone Encounter (Signed)
(  Dizziness would be a very unusual side effects, perhaps gagging from swishing and spitting it.) ?Advise patient: ?Continue nystatin 4 times a day. ?Use only half teaspoon of it. ?Place it in the fridge so it is cold when he uses it. ?swishing briefly and spit. ?If severe dizziness, that is a different issue, needs to be seen ?

## 2021-12-19 ENCOUNTER — Ambulatory Visit (INDEPENDENT_AMBULATORY_CARE_PROVIDER_SITE_OTHER): Payer: Medicare Other | Admitting: Pharmacist

## 2021-12-19 DIAGNOSIS — I509 Heart failure, unspecified: Secondary | ICD-10-CM

## 2021-12-19 DIAGNOSIS — E785 Hyperlipidemia, unspecified: Secondary | ICD-10-CM

## 2021-12-19 DIAGNOSIS — I10 Essential (primary) hypertension: Secondary | ICD-10-CM

## 2021-12-19 NOTE — Patient Instructions (Signed)
Mr Ardito ?It was a pleasure speaking with you  ?Below is a summary of your health goals and care plan ? ?Patient Goals/Self-Care Activities ?Over the next 90 days, patient will:  ?take medications as prescribed,  ?check blood pressure 2 to 3 times per week, document, and provide at future appointments,  ?target a minimum of 150 minutes of moderate intensity exercise weekly, and  ?engage in dietary modifications by continuing to avoid sodas, eat more vegetable and limit high fat foods ?Request copy of potassium / Basic metabolic panel if checked by Dr Beatrix Fetters at upcoming appointment.  ?Recommend patient receive Tetanus booster in 2023 ?Recommend patient receive 2nd dose of Shingrix in between 01/15/2022 and 05/18/2022 ? ? ? ?If you have any questions or concerns, please feel free to contact me either at the phone number below or with a MyChart message.  ? ?Keep up the good work! ? ?[name], PharmD ?Clinical Pharmacist ?[facility] ?[place] ?[phone] (direct line)  ?[phone] (main office number) ? ?Chronic Care Management Care Plan ? 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 ?Pharmacist Clinical Goal(s): ?Over the next 90 days, patient will work with PharmD and providers to maintain BP goal <130/80 ?Current regimen:  ?Carvedilol '25mg'$  twice daily  ?Triamterene/hydrochlorothiazide  37.5/'25mg'$  daily ?Amlodipine '10mg'$  daily ?Losartan '100mg'$  daily ?Furosemide '40mg'$  twice daily ?Interventions: ?Discussed blood pressure goal ?Recommended check blood pressure at home 2 to 3 times per week and record ?Patient self care activities - Over the next 90 days, patient will: ?Check blood pressure 2 to 3 times week, document, and provide at future appointments ?Ensure daily salt intake < 2300 mg/day ?Continue current regimen for blood pressure control - sent updated prescription to [facility] for  losartan ? ?Hyperlipidemia/ heart disease ?Lab Results  ?Component Value Date/Time  ? [place] 88 11/22/2021 09:50 AM  ? ?Pharmacist Clinical Goal(s): ?Over the next 90 days, patient will work with PharmD and providers to achieve LDL goal < 70 ?Current regimen:  ?Aspirin '81mg'$  daily ?Interventions: ?Discussed LDL goal ?Patient self care activities - Over the next 90 days, patient will: ?Continue to avoid sodas and high fat foods;  ? ?BPH / Prostate cancer ?Pharmacist Clinical Goal(s) ?Over the next 90 days, patient will work with PharmD and providers to reduce symptoms associated with BPH ?Current regimen:  ?Tamsulosin 0.'4mg'$  daily as needed  ?Patient self care activities - Over the next 90 days, patient will: ?Continue current therapy ?Continue to follow up with [name] ? ?GERD / acid reflux  ?Pharmacist Clinical Goal(s) ?Over the next 90 days, patient will work with PharmD and providers to prevent acid reflux symptoms and assess if omeprazole is still needed.  ?Current therapy:  ?Omeprazole '40mg'$  daily   ?Patient self care activities - Over the next 90 days, patient will:  ?Continue to take omeprazole '40mg'$  daily.  ?Ordered refill at [facility] for you ? ?Health Maintenance  ?Pharmacist Clinical Goal(s) ?Over the next 90 days, patient will work with PharmD and providers to complete health maintenance screenings/vaccinations ?Interventions: ?Recommend patient receive Tetanus booster in 2023 ?Recommend patient receive 2nd Shingrix between 01/15/2022 and 05/18/2022 ?Patient self care activities - Over the next 90 days, patient will: ?Plan to get Tetanus booster and 2nd Shingrix in 2023 ? ?Medication management ?Pharmacist Clinical Goal(s): ?Over the next 90 days, patient will work with PharmD and providers to maintain optimal medication adherence ?[facility] ?Interventions ?Comprehensive medication review performed and medication list updated.  ?Continue current medication management strategy ?Patient self care  activities - Over the next 90 days, patient will: ?Focus on medication adherence by filling and taking medications appropriately  ?Take medications as prescribed ?Report any questions or concerns to PharmD and/or provider(s) ? ? ?Patient verbalizes understanding of instructions and care plan provided today and agrees to view in [place]. Active MyChart status confirmed with patient.    ?

## 2021-12-19 NOTE — Chronic Care Management (AMB) (Signed)
? ? ?Chronic Care Management ?Pharmacy Note ? ?12/19/2021 ?Name:  David Parsons MRN:  [id number] DOB:  07/27/1941 ? ?Summary:  ?Last potassium was low at 3.4 on 11/22/2021. Patient was to recheck in 3 to 4 weeks. Due to recheck now. However he is scheduled to have labs for Dr Luberta Robertson 12/22/2021. Suspect labs might include BMP. Patient will request copy of labs and bring to our office. If no BMP then will order.  ?Patient reminded to get 2nd Shingrix 01/15/2022 to 05/18/2022. Also reminded to get tetanus vaccine. ?Patient report mouth pain and appetite have improved since too Nystatin for thrush.  ? ?Subjective: ?David Parsons is an 81 y.o. year old male who is a primary patient of [name], MD.  The CCM team was consulted for assistance with disease management and care coordination needs.   ? ?Engaged with patient by telephone for follow up visit in response to provider referral for pharmacy case management and/or care coordination services.  ? ?Consent to Services:  ?The patient was given information about Chronic Care Management services, agreed to services, and gave verbal consent prior to initiation of services.  Please see initial visit note for detailed documentation.  ? ?Patient Care Team: ?[name], MD as PCP - General (Family Medicine) ?Burnell Blanks, MD as PCP - Cardiology (Cardiology) ?Burnell Blanks, MD as Consulting Physician (Cardiology) ?Gatha Mayer, MD as Consulting Physician (Gastroenterology) ?Druscilla Brownie, MD as Consulting Physician (Dermatology) ?Calvert Cantor, MD as Consulting Physician (Ophthalmology) ?Carolan Clines, MD (Inactive) as Consulting Physician (Urology) ?Gardiner Barefoot, DPM as Consulting Physician (Podiatry) ?[name], RPH-CPP (Pharmacist) ?Franchot Gallo, MD as Consulting Physician (Urology) ?Gardenia Phlegm, NP as Nurse Practitioner (Hematology and Oncology) ?Harmon Pier, RN as Registered Nurse ?Tyler Pita,  MD as Consulting Physician (Radiation Oncology) ? ?Recent office visits: ?12/14/2021 - PCP (Dr Larose Kells) acute visit for oral thrush. Not improved wiht Magic mouthwash. Prescribed nystatin suspension 4 times a day ?12/05/2021 - PCP Olevia Bowens, NP) Acute visit seen for oral thrush. Prescried Magic mouthwash - swish and spit 4 itmes a day as needed ?11/22/2021 - PCP ([name]) Preventative health visit. Labs checked. No med changes noted.  ?04/11/2021 - Fam Med ([name]) F/U HTN. Reported dizziness improved.  ? ?Recent consult visits: ?11/10/2021 - Cardio (Dr Angelena Form) F/U CAD and CHF. 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 No significant coronary atherosclerosis noted prior to his surgery. No chest pain. Continue ASA and beta blocker. He has not been on a statin. (no CAD noted on pre surgery cath). Ordered EKG and ECHO. 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 PSA was 3.05. No radiation related issued except urgency. Note that tamsulosin was not on urology office med list. F/U 4 months.  ?07/21/2021 - Oncology (nurse visit) to f/u prostate neoplasm ?06/08/2021 - Rad Onc (Bruning, PAC) F/U post radiation for prostate cancer. continue to follow up with urology for ongoing PSA determinations - due 07/2021. ?  ? ?Hospital visits: ?None in previous 6 months ? ?Objective: ? ?Lab Results  ?Component Value Date  ? CREATININE 1.18 11/22/2021  ? CREATININE 1.00 04/18/2021  ? CREATININE 1.00 12/07/2020  ? ? ?Lab Results  ?Component Value Date  ? HGBA1C 5.7 11/22/2021  ? ?  Last diabetic Eye exam: No results found for: HMDIABEYEEXA  ?Last diabetic Foot exam: No results found for: HMDIABFOOTEX  ? ?   ?Component Value Date/Time  ? CHOL 155 11/22/2021 0950  ? TRIG 169.0 (H)  11/22/2021 0950  ? HDL 33.00 (L) 11/22/2021 0950  ? CHOLHDL 5 11/22/2021 0950  ? VLDL 33.8 11/22/2021 0950  ? [place] 88 11/22/2021 0950  ? ? ? ?  Latest Ref Rng & Units 11/22/2021  ?  9:50 AM 04/18/2021  ? 10:18 AM 12/07/2020  ?  9:28 AM  ?Hepatic Function  ?Total Protein 6.0 - 8.3 g/dL 6.8   6.2   6.9    ?Albumin 3.5 - 5.2 g/dL 4.2   3.6   3.9    ?AST 0 - 37 U/L 17   15   12     ?ALT 0 - 53 U/L 15   11   12     ?Alk Phosphatase 39 - 117 U/L 61   58   75    ?Total Bilirubin 0.2 - 1.2 mg/dL 1.6   1.2   1.4    ? ? ?Lab Results  ?Component Value Date/Time  ? TSH 1.62 11/22/2021 09:50 AM  ? TSH 1.83 04/11/2021 02:39 PM  ? ? ? ?  Latest Ref Rng & Units 11/22/2021  ?  9:50 AM 04/11/2021  ?  2:39 PM 12/07/2020  ?  9:28 AM  ?CBC  ?WBC 4.0 - 10.5 K/uL 6.2   5.6   8.4    ?Hemoglobin 13.0 - 17.0 g/dL 13.9   12.3   13.9    ?Hematocrit 39.0 - 52.0 % 41.1   36.6   42.1    ?Platelets 150.0 - 400.0 K/uL 279.0   190.0   284.0    ? ? ?No results found for: VD25OH ? ?Clinical ASCVD: Yes  ?The 10-year ASCVD risk score (Arnett DK, et al., 2019) is: 42.4% ?  Values used to calculate the score: ?    Age: 29 years ?    Sex: Male ?    Is Non-Hispanic African American: Yes ?    Diabetic: Yes ?    Tobacco smoker: No ?    Systolic Blood Pressure: 546 mmHg ?    Is BP treated: Yes ?    HDL Cholesterol: 33 mg/dL ?    Total Cholesterol: 155 mg/dL   ? ? ?Social History  ? ?Tobacco Use  ?Smoking Status Never  ?Smokeless Tobacco Never  ? ?BP Readings from Last 3 Encounters:  ?12/14/21 126/74  ?12/05/21 131/76  ?11/22/21 110/70  ? ?Pulse Readings from Last 3 Encounters:  ?12/14/21 68  ?12/05/21 73  ?11/22/21 71  ? ?Wt Readings from Last 3 Encounters:  ?12/14/21 295 lb 4 oz (133.9 kg)  ?12/05/21 294 lb 12.8 oz (133.7 kg)  ?11/22/21 (!) 300 lb 6.4 oz (136.3 kg)  ? ? ?Assessment: Review of patient past medical history, allergies, medications, health status, including review of consultants reports, laboratory and other test data, was performed as part of  comprehensive evaluation and provision of chronic care management services.  ? ?SDOH:  (Social Determinants of Health) assessments and interventions performed:  ? ? ? ? ?[place] ? ?Allergies  ?Allergen Reactions  ? Latex Hives  ?  Latex gloves ?  ? ? ?Medications Reviewed Today   ? ? Reviewed by [name], RPH-CPP (Pharmacist) on 12/19/21 at [place] List Status: <None>  ? ?Medication Order Taking? Sig Documenting Provider Last Dose Status  Informant  ?amLODipine (NORVASC) 10 MG tablet [id number] Yes TAKE 1 TABLET BY MOUTH EVERY DAY [name], MD Taking Active   ?aspirin EC 81 MG tablet [id number] Yes Take 81 mg by mouth daily. [provider] Taking Active   ?carvedilol (COREG) 25 MG tablet [id number] Yes TAKE 1 TABLET BY MOUTH TWICE A DAY WITH FOOD [name], MD Taking Active   ?Cholecalciferol (VITAMIN D3) 50 MCG (2000 UT) CAPS [id number] Yes Take 1 capsule by mouth daily. [provider] Taking Active   ?clobetasol ointment (TEMOVATE) 0.05 % [id number] Yes Apply 1 application topically 2 (two) times daily as needed. [provider] Taking Active   ?         ?Med Note (CANTER, Cheri Rous   Fri Aug 31, 2017 10:23 AM)    ?diazepam (VALIUM) 5 MG tablet [id number] Yes TAKE 1/2-2 TABLETS BY MOUTH DAILY AS NEEDED FOR ANXIETY. Copland, Gay Filler, MD Taking Active   ?         ?Med Note Antony Contras, Tariyah Pendry B   Mon Sep 26, 2021 10:40 AM) Uses rarely - about 3 or 4 times per year  ?ferrous sulfate 325 (65 FE) MG tablet [id number] Yes TAKE 1 TABLET BY MOUTH 2 TIMES DAILY WITH A MEAL. [name], MD Taking Active   ?furosemide (LASIX) 40 MG tablet [id number] Yes Take 1 tablet (40 mg total) by mouth daily. Burnell Blanks, MD Taking Active   ?KLOR-CON M20 20 MEQ tablet [id number] Yes TAKE 3 TABLETS (60 MEQ TOTAL) BY MOUTH 3 (THREE) TIMES DAILY. [name], MD Taking Active   ?latanoprost (XALATAN) 0.005 % ophthalmic solution [id number] Yes 1 drop at bedtime. [provider] Taking Active   ?losartan (COZAAR) 100 MG tablet [id number] Yes TAKE 1 TABLET BY MOUTH EVERY DAY [name], MD Taking Active   ?Multiple Vitamin (MULITIVITAMIN WITH MINERALS) TABS [id number] Yes Take 1

## 2021-12-21 NOTE — Progress Notes (Signed)
?  Subjective:  ?Patient ID: David Parsons, male    DOB: June 04, 1941,  MRN: [id number] ? ?David Parsons presents to clinic today for painful elongated mycotic toenails 1-5 bilaterally which are tender when wearing enclosed shoe gear. Pain is relieved with periodic professional debridement. ? ?New problem(s): None.  ? ?PCP is [name], MD , and last visit was November 22, 2021. ? ?Allergies  ?Allergen Reactions  ? Latex Hives  ?  Latex gloves ?  ? ? ?Review of Systems: Negative except as noted in the HPI. ? ?Objective: No changes noted in today's physical examination. ?Constitutional David Parsons is a pleasant 81 y.o. African American male, morbidly obese in NAD. AAO x 3.   ?Vascular CFT immediate b/l LE. Palpable DP/PT pulses b/l LE. Digital hair absent b/l. Skin temperature gradient WNL b/l. No pain with calf compression b/l. No edema noted b/l. No cyanosis or clubbing noted b/l LE.  ?Neurologic Normal speech. Oriented to person, place, and time. Protective sensation intact 5/5 intact bilaterally with 10g monofilament b/l.  ?Dermatologic Pedal integument with normal turgor, texture and tone b/l LE. No open wounds b/l. No interdigital macerations b/l. Toenails 1-5 b/l elongated, thickened, discolored with subungual debris. +Tenderness with dorsal palpation of nailplates. Hyperkeratotic lesion(s) noted submet head 5 b/l.  ?Orthopedic: Normal muscle strength 5/5 to all lower extremity muscle groups bilaterally. Hammertoe deformity noted 2-5 b/l. Pes planus deformity noted bilateral LE.Marland Kitchen No pain, crepitus or joint limitation noted with ROM b/l LE.  Patient ambulates independently without assistive aids.  ? ?Radiographs: None ? ?  Latest Ref Rng & Units 11/22/2021  ?  9:50 AM 04/11/2021  ?  2:39 PM  ?Hemoglobin A1C  ?Hemoglobin-A1c 4.6 - 6.5 % 5.7   5.4    ? ?Assessment/Plan: ?1. Pain due to onychomycosis of toenails of both feet   ?  ? ?-Patient was evaluated and treated. All patient's and/or POA's  questions/concerns answered on today's visit. ?-Medicare ABN on file for refusal of paring of corn(s)/callus(es)/porokeratos(es) today. Copy in patient chart. ?-Patient to continue soft, supportive shoe gear daily. ?-Toenails 1-5 b/l were debrided in length and girth with sterile nail nippers and dremel without iatrogenic bleeding.  ?-Patient/POA to call should there be question/concern in the interim.  ? ?Return in about 3 months (around 03/15/2022). ? ?Marzetta Board, DPM  ?

## 2021-12-22 DIAGNOSIS — C61 Malignant neoplasm of prostate: Secondary | ICD-10-CM | POA: Diagnosis not present

## 2021-12-28 DIAGNOSIS — N400 Enlarged prostate without lower urinary tract symptoms: Secondary | ICD-10-CM | POA: Diagnosis not present

## 2021-12-28 DIAGNOSIS — Z8546 Personal history of malignant neoplasm of prostate: Secondary | ICD-10-CM | POA: Diagnosis not present

## 2022-01-05 ENCOUNTER — Encounter: Payer: Self-pay | Admitting: Family Medicine

## 2022-01-05 DIAGNOSIS — M65331 Trigger finger, right middle finger: Secondary | ICD-10-CM

## 2022-01-11 ENCOUNTER — Telehealth: Payer: Self-pay | Admitting: Family Medicine

## 2022-01-11 NOTE — Telephone Encounter (Signed)
Left message for patient to call back and schedule Medicare Annual Wellness Visit (AWV).   Please offer to do virtually or by telephone.  Left office number and my jabber [phone].  Last AWV:08/18/2019  Please schedule at anytime with Nurse Health Advisor.

## 2022-01-17 ENCOUNTER — Ambulatory Visit (INDEPENDENT_AMBULATORY_CARE_PROVIDER_SITE_OTHER): Payer: Medicare Other

## 2022-01-17 VITALS — Ht 73.0 in | Wt 290.0 lb

## 2022-01-17 DIAGNOSIS — Z0000 Encounter for general adult medical examination without abnormal findings: Secondary | ICD-10-CM | POA: Diagnosis not present

## 2022-01-17 NOTE — Patient Instructions (Addendum)
David Parsons , Thank you for taking time to complete your Medicare Wellness Visit. I appreciate your ongoing commitment to your health goals. Please review the following plan we discussed and let me know if I can assist you in the future.   Screening recommendations/referrals: Colonoscopy: No longer required Recommended yearly ophthalmology/optometry visit for glaucoma screening and checkup Recommended yearly dental visit for hygiene and checkup  Vaccinations: Influenza vaccine: Up to date Pneumococcal vaccine: Up to date Tdap vaccine: Due-May obtain vaccine at your local pharmacy. Shingles vaccine: Completed first dose   Covid-19: Up to date  Advanced directives: Per our conversation, you have forms at home  Conditions/risks identified: See problem list  Next appointment: Follow up in one year for your annual wellness visit.   Preventive Care 80 Years and Older, Male Preventive care refers to lifestyle choices and visits with your health care provider that can promote health and wellness. What does preventive care include? A yearly physical exam. This is also called an annual well check. Dental exams once or twice a year. Routine eye exams. Ask your health care provider how often you should have your eyes checked. Personal lifestyle choices, including: Daily care of your teeth and gums. Regular physical activity. Eating a healthy diet. Avoiding tobacco and drug use. Limiting alcohol use. Practicing safe sex. Taking low doses of aspirin every day. Taking vitamin and mineral supplements as recommended by your health care provider. What happens during an annual well check? The services and screenings done by your health care provider during your annual well check will depend on your age, overall health, lifestyle risk factors, and family history of disease. Counseling  Your health care provider may ask you questions about your: Alcohol use. Tobacco use. Drug use. Emotional  well-being. Home and relationship well-being. Sexual activity. Eating habits. History of falls. Memory and ability to understand (cognition). Work and work Statistician. Screening  You may have the following tests or measurements: Height, weight, and BMI. Blood pressure. Lipid and cholesterol levels. These may be checked every 5 years, or more frequently if you are over 80 years old. Skin check. Lung cancer screening. You may have this screening every year starting at age 80 if you have a 30-pack-year history of smoking and currently smoke or have quit within the past 15 years. Fecal occult blood test (FOBT) of the stool. You may have this test every year starting at age 80. Flexible sigmoidoscopy or colonoscopy. You may have a sigmoidoscopy every 5 years or a colonoscopy every 10 years starting at age 80. Prostate cancer screening. Recommendations will vary depending on your family history and other risks. Hepatitis C blood test. Hepatitis B blood test. Sexually transmitted disease (STD) testing. Diabetes screening. This is done by checking your blood sugar (glucose) after you have not eaten for a while (fasting). You may have this done every 1-3 years. Abdominal aortic aneurysm (AAA) screening. You may need this if you are a current or former smoker. Osteoporosis. You may be screened starting at age 80 if you are at high risk. Talk with your health care provider about your test results, treatment options, and if necessary, the need for more tests. Vaccines  Your health care provider may recommend certain vaccines, such as: Influenza vaccine. This is recommended every year. Tetanus, diphtheria, and acellular pertussis (Tdap, Td) vaccine. You may need a Td booster every 10 years. Zoster vaccine. You may need this after age 65. Pneumococcal 13-valent conjugate (PCV13) vaccine. One dose is recommended after age  80. Pneumococcal polysaccharide (PPSV23) vaccine. One dose is recommended after  age 80. Talk to your health care provider about which screenings and vaccines you need and how often you need them. This information is not intended to replace advice given to you by your health care provider. Make sure you discuss any questions you have with your health care provider. Document Released: 09/03/2015 Document Revised: 04/26/2016 Document Reviewed: 06/08/2015 Elsevier Interactive Patient Education  2017 [place] Prevention in the Home Falls can cause injuries. They can happen to people of all ages. There are many things you can do to make your home safe and to help prevent falls. What can I do on the outside of my home? Regularly fix the edges of walkways and driveways and fix any cracks. Remove anything that might make you trip as you walk through a door, such as a raised step or threshold. Trim any bushes or trees on the path to your home. Use bright outdoor lighting. Clear any walking paths of anything that might make someone trip, such as rocks or tools. Regularly check to see if handrails are loose or broken. Make sure that both sides of any steps have handrails. Any raised decks and porches should have guardrails on the edges. Have any leaves, snow, or ice cleared regularly. Use sand or salt on walking paths during winter. Clean up any spills in your garage right away. This includes oil or grease spills. What can I do in the bathroom? Use night lights. Install grab bars by the toilet and in the tub and shower. Do not use towel bars as grab bars. Use non-skid mats or decals in the tub or shower. If you need to sit down in the shower, use a plastic, non-slip stool. Keep the floor dry. Clean up any water that spills on the floor as soon as it happens. Remove soap buildup in the tub or shower regularly. Attach bath mats securely with double-sided non-slip rug tape. Do not have throw rugs and other things on the floor that can make you trip. What can I do in the  bedroom? Use night lights. Make sure that you have a light by your bed that is easy to reach. Do not use any sheets or blankets that are too big for your bed. They should not hang down onto the floor. Have a firm chair that has side arms. You can use this for support while you get dressed. Do not have throw rugs and other things on the floor that can make you trip. What can I do in the kitchen? Clean up any spills right away. Avoid walking on wet floors. Keep items that you use a lot in easy-to-reach places. If you need to reach something above you, use a strong step stool that has a grab bar. Keep electrical cords out of the way. Do not use floor polish or wax that makes floors slippery. If you must use wax, use non-skid floor wax. Do not have throw rugs and other things on the floor that can make you trip. What can I do with my stairs? Do not leave any items on the stairs. Make sure that there are handrails on both sides of the stairs and use them. Fix handrails that are broken or loose. Make sure that handrails are as long as the stairways. Check any carpeting to make sure that it is firmly attached to the stairs. Fix any carpet that is loose or worn. Avoid having throw rugs at  the top or bottom of the stairs. If you do have throw rugs, attach them to the floor with carpet tape. Make sure that you have a light switch at the top of the stairs and the bottom of the stairs. If you do not have them, ask someone to add them for you. What else can I do to help prevent falls? Wear shoes that: Do not have high heels. Have rubber bottoms. Are comfortable and fit you well. Are closed at the toe. Do not wear sandals. If you use a stepladder: Make sure that it is fully opened. Do not climb a closed stepladder. Make sure that both sides of the stepladder are locked into place. Ask someone to hold it for you, if possible. Clearly mark and make sure that you can see: Any grab bars or  handrails. First and last steps. Where the edge of each step is. Use tools that help you move around (mobility aids) if they are needed. These include: Canes. Walkers. Scooters. Crutches. Turn on the lights when you go into a dark area. Replace any light bulbs as soon as they burn out. Set up your furniture so you have a clear path. Avoid moving your furniture around. If any of your floors are uneven, fix them. If there are any pets around you, be aware of where they are. Review your medicines with your doctor. Some medicines can make you feel dizzy. This can increase your chance of falling. Ask your doctor what other things that you can do to help prevent falls. This information is not intended to replace advice given to you by your health care provider. Make sure you discuss any questions you have with your health care provider. Document Released: 06/03/2009 Document Revised: 01/13/2016 Document Reviewed: 09/11/2014 Elsevier Interactive Patient Education  2017 [employer].

## 2022-01-17 NOTE — Progress Notes (Signed)
Subjective:   David Parsons is a 81 y.o. male who presents for Medicare Annual/Subsequent preventive examination.  I connected with Purcell Nails today by telephone and verified that I am speaking with the correct person using two identifiers. Location patient: home Location provider: work Persons participating in the virtual visit: patient, Marine scientist.    I discussed the limitations, risks, security and privacy concerns of performing an evaluation and management service by telephone and the availability of in person appointments. I also discussed with the patient that there may be a patient responsible charge related to this service. The patient expressed understanding and verbally consented to this telephonic visit.    Interactive audio and video telecommunications were attempted between this provider and patient, however failed, due to patient having technical difficulties OR patient did not have access to video capability.  We continued and completed visit with audio only.  Some vital signs may be absent or patient reported.   Time Spent with patient on telephone encounter: 20 minutes   Review of Systems     Cardiac Risk Factors include: advanced age (>104mn, >>36women);male gender;hypertension;obesity (BMI >30kg/m2)     Objective:    Today's Vitals   01/17/22 0837  Weight: 290 lb (131.5 kg)  Height: '6\' 1"'$  (1.854 m)   Body mass index is 38.26 kg/m.     01/17/2022    8:41 AM 06/06/2021    4:10 PM 02/03/2021    1:56 PM 08/18/2019    8:10 AM 08/12/2018    9:36 AM 08/09/2017    9:30 AM 08/01/2016   11:52 AM  Advanced Directives  Does Patient Have a Medical Advance Directive? No No No No Yes No No  Type of APersonnel officerLiving will    Does patient want to make changes to medical advance directive?       Yes (MAU/Ambulatory/Procedural Areas - Information given)  Copy of HSunrisein Chart?     Yes - validated most recent  copy scanned in chart (See row information)    Would patient like information on creating a medical advance directive?  No - Patient declined  No - Patient declined  Yes (MAU/Ambulatory/Procedural Areas - Information given)     Current Medications (verified) Outpatient Encounter Medications as of 01/17/2022  Medication Sig   amLODipine (NORVASC) 10 MG tablet TAKE 1 TABLET BY MOUTH EVERY DAY   aspirin EC 81 MG tablet Take 81 mg by mouth daily.   carvedilol (COREG) 25 MG tablet TAKE 1 TABLET BY MOUTH TWICE A DAY WITH FOOD   Cholecalciferol (VITAMIN D3) 50 MCG (2000 UT) CAPS Take 1 capsule by mouth daily.   clobetasol ointment (TEMOVATE) 01.11% Apply 1 application topically 2 (two) times daily as needed.   diazepam (VALIUM) 5 MG tablet TAKE 1/2-2 TABLETS BY MOUTH DAILY AS NEEDED FOR ANXIETY.   ferrous sulfate 325 (65 FE) MG tablet TAKE 1 TABLET BY MOUTH 2 TIMES DAILY WITH A MEAL.   furosemide (LASIX) 40 MG tablet Take 1 tablet (40 mg total) by mouth daily.   KLOR-CON M20 20 MEQ tablet TAKE 3 TABLETS (60 MEQ TOTAL) BY MOUTH 3 (THREE) TIMES DAILY.   latanoprost (XALATAN) 0.005 % ophthalmic solution 1 drop at bedtime.   losartan (COZAAR) 100 MG tablet TAKE 1 TABLET BY MOUTH EVERY DAY   Multiple Vitamin (MULITIVITAMIN WITH MINERALS) TABS Take 1 tablet by mouth daily.   nystatin (MYCOSTATIN) 100000 UNIT/ML suspension Take 5  mLs (500,000 Units total) by mouth 4 (four) times daily.   omeprazole (PRILOSEC) 40 MG capsule TAKE 1 CAPSULE BY MOUTH EVERY DAY   Probiotic Product (PROBIOTIC DAILY PO) Take 1 tablet by mouth daily.   tamsulosin (FLOMAX) 0.4 MG CAPS capsule Take 1 capsule (0.4 mg total) by mouth daily.   timolol (TIMOPTIC) 0.5 % ophthalmic solution INSTILL 1 DROP INTO BOTH EYES TWICE A DAY   triamterene-hydrochlorothiazide (MAXZIDE-25) 37.5-25 MG tablet TAKE 1 TABLET BY MOUTH EVERY DAY   No facility-administered encounter medications on file as of 01/17/2022.    Allergies (verified) Latex    History: Past Medical History:  Diagnosis Date   Aortic insufficiency     02/16/09 Chest CT:  The aortic root has a diameter measuring 4.6 cm, image 64 of the coronal series.  The ascending    thoracic aorta has a diameter of 3.8 cm, image 65 of the coronal series.  At the level of the aortic arch    the thoracic aorta has a maximum diameter of 3.2 cm.  The descending thoracic aorta has a maximum           diameter of 3.2 cm.  There is no evidence for aortic disse         Aortic root enlargement (HCC)    The aortic root has a diameter measuring 4.6 cm, image 64 of the coronal series.  The ascending  thoracic aorta has a diameter of 3.8 cm, image 65 of the coronal series.  At the level of the aortic arch   the thoracic aorta has a maximum diameter of 3.2 cm.  The descending thoracic aorta has a maximum   diameter of 3.2 cm.  There is no evidence for aortic dissection.        CAD (coronary artery disease)    Cardiomyopathy     01/2009 - Cath - nonobs. dzs.  EF 35%.   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   Biological Bentall Aortic Root Replacement using 25 mm Edwards Magna Ease pericardial tissue valve and 28 mm Vascutek Gelweave Valsalva conduit with reimplantation of left main and right coronary artery and CABG x1 using SVG to RCA   Sleep apnea    occasionally wears CPAP at night   Past Surgical History:  Procedure Laterality Date   ADRENALECTOMY     BENTALL PROCEDURE  12/28/2011    Procedure: BENTALL PROCEDURE;  Surgeon: Rexene Alberts, MD;  Location: [facility];  Service: Open Heart Surgery;  Laterality: N/A;   CARDIAC CATHETERIZATION     2013   CARDIAC VALVE REPLACEMENT  12/28/2011   Bentall with tissue valve   CHOLECYSTECTOMY     COLONOSCOPY  09/25/2011   COLONOSCOPY W/ BIOPSIES AND POLYPECTOMY  11/04/2008   colon polyps, diverticulosis    CORONARY ARTERY BYPASS GRAFT  12/28/2011   Procedure: CORONARY ARTERY BYPASS GRAFTING (CABG);  Surgeon: Rexene Alberts, MD;  Location: [facility] OR;  Service: Open Heart Surgery;  Laterality: N/A;  cabg x 1   LEFT AND RIGHT HEART CATHETERIZATION WITH CORONARY ANGIOGRAM N/A 11/22/2011   Procedure: LEFT AND RIGHT HEART CATHETERIZATION WITH CORONARY ANGIOGRAM;  Surgeon: Burnell Blanks, MD;  Location[facility] C[facility] LAB;  Service: Cardiovascular;  Laterality: N/A;   MULTIPLE TOOTH EXTRACTIONS     PROSTATE BIOPSY Bilateral    TEE WITHOUT CARDIOVERSION  11/22/2011   Procedure: TRANSESOPHAGEAL ECHOCARDIOGRAM (TEE);  Surgeon: Larey Dresser, MD;  Location: [facility] ENDOSCOPY;  Service: Cardiovascular;  Laterality: N/A;   UPPER GASTROINTESTINAL ENDOSCOPY  11/04/2008   w/bx, H pylori gastritis   UPPER GASTROINTESTINAL ENDOSCOPY  09/25/2011   Family History  Problem Relation Age of Onset   Breast cancer Sister    Diabetes Sister    Colon cancer Brother 7       at least 17   Diabetes Brother    Cancer Brother        colon, prostate   Prostate cancer Brother    Diabetes Brother    Diabetes Sister    Cancer Sister        breast, colon, melanoma   Heart disease Sister    Hypertension Mother    Arthritis Mother    Kidney failure Sister    Colon cancer Sister    Breast cancer Sister    Kidney disease Sister        s/p transplant   Hypertension Sister    Diabetes Sister    Stroke Father    Hypertension Father    Social History   Socioeconomic History   Marital status: Married    Spouse name: [name]   Number of children: 0   Years of  education: Not on file   Highest education level: Not on file  Occupational History    Employer: [employer]    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  He  lives a very sedentary lifestyle.  He is a nonsmoker.  He denies alcohol consumption.    Social Determinants of Health   Financial Resource Strain: Low Risk    Difficulty of Paying Living Expenses: Not very hard  Food Insecurity: No Food Insecurity   Worried About Charity fundraiser in the Last Year: Never true   Ran Out of Food in the Last Year: Never true  Transportation Needs: No Transportation Needs   Lack of Transportation (Medical): No   Lack of Transportation (Non-Medical): No  Physical Activity: Sufficiently Active   Days of Exercise per Week: 6 days   Minutes of Exercise per Session: 40 min  Stress: No Stress Concern Present   Feeling of Stress : Not at all  Social Connections: Socially Integrated   Frequency of Communication with Friends and Family: More than three times a week   Frequency of Social Gatherings with Friends and Family: Three times a week   Attends Religious Services: More than 4 times per year  Active Member of Clubs or Organizations: Yes   Attends Music therapist: More than 4 times per year   Marital Status: Married    Tobacco Counseling Counseling given: Not Answered   Clinical Intake:  Pre-visit preparation completed: Yes  Pain : No/denies pain     BMI - recorded: 38.26 Nutritional Status: BMI > 30  Obese Nutritional Risks:  None Diabetes: No  How often do you need to have someone help you when you read instructions, pamphlets, or other written materials from your doctor or pharmacy?: 1 - Never  Diabetic?No  Interpreter Needed?: No  Information entered by :: Caroleen Hamman LPN   Activities of Daily Living    01/17/2022    8:45 AM  In your present state of health, do you have any difficulty performing the following activities:  Hearing? 0  Vision? 0  Difficulty concentrating or making decisions? 0  Walking or climbing stairs? 0  Dressing or bathing? 0  Doing errands, shopping? 0  Preparing Food and eating ? N  Using the Toilet? N  In the past six months, have you accidently leaked urine? N  Do you have problems with loss of bowel control? N  Managing your Medications? N  Managing your Finances? N  Housekeeping or managing your Housekeeping? N    Patient Care Team: [name], MD as PCP - General (Family Medicine) Burnell Blanks, MD as PCP - Cardiology (Cardiology) Burnell Blanks, MD as Consulting Physician (Cardiology) Gatha Mayer, MD as Consulting Physician (Gastroenterology) Druscilla Brownie, MD as Consulting Physician (Dermatology) Calvert Cantor, MD as Consulting Physician (Ophthalmology) Carolan Clines, MD (Inactive) as Consulting Physician (Urology) Gardiner Barefoot, DPM as Consulting Physician (Podiatry) [name], Morgan (Pharmacist) Franchot Gallo, MD as Consulting Physician (Urology) Delice Bison, Charlestine Massed, NP as Nurse Practitioner (Hematology and Oncology) Harmon Pier, RN as Registered Nurse Tyler Pita, MD as Consulting Physician (Radiation Oncology)  Indicate any recent [place] you may have received from other than [facility] providers in the past year (date may be approximate).     Assessment:   This is a routine wellness examination for [employer].  Hearing/Vision screen Hearing Screening - Comments:: No issues Vision  Screening - Comments:: Last eye exam- Dr. Florencia Reasons months  Dietary issues and exercise activities discussed: Current Exercise Habits: Home exercise routine, Type of exercise: walking, Time (Minutes): 30, Frequency (Times/Week): 7, Weekly Exercise (Minutes/Week): 210, Intensity: Mild, Exercise limited by: None identified   Goals Addressed             This Visit's Progress    Increase water intake   On track      Depression Screen    01/17/2022    8:45 AM 11/22/2021    9:15 AM 08/02/2021   12:04 PM 04/11/2021    2:06 PM 08/30/2020    9:48 AM 08/18/2019    8:14 AM 08/09/2017    9:34 AM  PHQ 2/9 Scores  PHQ - 2 Score 0 0 0 0 0 0 0  PHQ- 9 Score     0      Fall Risk    01/17/2022    8:43 AM 11/22/2021    9:15 AM 04/11/2021    2:06 PM 02/28/2021   10:41 AM 08/18/2019    8:14 AM  Fall Risk   Falls in the past year? 0 0 0 0 0  Number falls in past yr: 0 0 0 0   Injury with Fall? 0 0 0 0  Risk for fall due to :    Impaired vision   Follow up Falls prevention discussed Falls evaluation completed  Falls evaluation completed Education provided;Falls prevention discussed    FALL RISK PREVENTION PERTAINING TO THE HOME:  Any stairs in or around the home? No  Home free of loose throw rugs in walkways, pet beds, electrical cords, etc? Yes  Adequate lighting in your home to reduce risk of falls? Yes   ASSISTIVE DEVICES UTILIZED TO PREVENT FALLS:  Life alert? No  Use of a cane, walker or w/c? No  Grab bars in the bathroom? Yes  Shower chair or bench in shower? No  Elevated toilet seat or a handicapped toilet? No   TIMED UP AND GO:  Was the test performed? No . Phone visit   Cognitive Function:Normal cognitive status assessed by  this Nurse Health Advisor. No abnormalities found.      08/09/2017    9:35 AM 08/01/2016   11:54 AM  MMSE - Mini Mental State Exam  Orientation to time 5 5  Orientation to Place 4 5  Registration 3 3  Attention/ Calculation 5 3  Recall 2 2   Language- name 2 objects 2 2  Language- repeat 1 1  Language- follow 3 step command 3 3  Language- read & follow direction 1 1  Write a sentence 1 1  Copy design 1 1  Total score 28 27        08/18/2019    8:15 AM  6CIT Screen  What Year? 0 points  What month? 0 points  What time? 0 points  Count back from 20 0 points  Months in reverse 0 points  Repeat phrase 0 points  Total Score 0 points    Immunizations Immunization History  Administered Date(s) Administered   Fluad Quad(high Dose 65+) 04/22/2019, 05/07/2020, 05/10/2021   Influenza Split 05/15/2011, 05/22/2012   Influenza Whole 06/07/2009, 05/11/2010   Influenza, High Dose Seasonal PF 04/22/2015, 05/07/2017, 05/14/2018   Influenza,inj,Quad PF,6+ Mos 05/09/2013, 04/20/2014   Influenza-Unspecified 05/17/2016   Moderna Covid-19 Vaccine Bivalent Booster 55yr & up 07/01/2021   Moderna Sars-Covid-2 Vaccination 09/20/2019, 10/28/2019, 07/06/2020   Pneumococcal Conjugate-13 05/09/2013   Pneumococcal Polysaccharide-23 04/16/2006, 04/22/2015   Td 04/14/2008   Zoster Recombinat (Shingrix) 11/15/2021    TDAP status: Due, Education has been provided regarding the importance of this vaccine. Advised may receive this vaccine at local pharmacy or Health Dept. Aware to provide a copy of the vaccination record if obtained from local pharmacy or Health Dept. Verbalized acceptance and understanding.  Flu Vaccine status: Up to date  Pneumococcal vaccine status: Up to date  Covid-19 vaccine status: Completed vaccines  Qualifies for Shingles Vaccine? Yes   Zostavax completed No   Shingrix completed? Has completed first dose-11/15/2021  Screening Tests Health Maintenance  Topic Date Due   TETANUS/TDAP  04/14/2018   Zoster Vaccines- Shingrix (2 of 2) 01/10/2022   INFLUENZA VACCINE  03/21/2022   Pneumonia Vaccine 80 Years old  Completed   COVID-19 Vaccine  Completed   Hepatitis C Screening  Completed   HPV VACCINES  Aged Out     Health Maintenance  Health Maintenance Due  Topic Date Due   TETANUS/TDAP  04/14/2018   Zoster Vaccines- Shingrix (2 of 2) 01/10/2022    Colorectal cancer screening: No longer required.   Lung Cancer Screening: (Low Dose CT Chest recommended if Age 80-80years, 30 pack-year currently smoking OR have quit w/in 15years.) does not qualify.  Additional Screening:  Hepatitis C Screening: Completed 04/11/2021  Vision Screening: Recommended annual ophthalmology exams for early detection of glaucoma and other disorders of the eye. Is the patient up to date with their annual eye exam?  Yes  Who is the provider or what is the name of the office in which the patient attends annual eye exams? Dr. Bing Plume   Dental Screening: Recommended annual dental exams for proper oral hygiene  Community Resource Referral / Chronic Care Management: CRR required this visit?  No   CCM required this visit?  No      Plan:     I have personally reviewed and noted the following in the patient's chart:   Medical and social history Use of alcohol, tobacco or illicit drugs  Current medications and supplements including opioid prescriptions. Patient is not currently taking opioid prescriptions. Functional ability and status Nutritional status Physical activity Advanced directives List of other physicians Hospitalizations, surgeries, and ER visits in previous 12 months Vitals Screenings to include cognitive, depression, and falls Referrals and appointments  In addition, I have reviewed and discussed with patient certain preventive protocols, quality metrics, and best practice recommendations. A written personalized care plan for preventive services as well as general preventive health recommendations were provided to patient.   Due to this being a telephonic visit, the after visit summary with patients personalized plan was offered to patient via mail or my-chart. Patient would like to access on  my-chart.  Marta Antu, LPN   [id number]  Nurse Health Advisor  Nurse Notes: None

## 2022-01-18 DIAGNOSIS — I509 Heart failure, unspecified: Secondary | ICD-10-CM | POA: Diagnosis not present

## 2022-01-18 DIAGNOSIS — I519 Heart disease, unspecified: Secondary | ICD-10-CM

## 2022-01-18 DIAGNOSIS — I110 Hypertensive heart disease with heart failure: Secondary | ICD-10-CM

## 2022-01-18 DIAGNOSIS — E785 Hyperlipidemia, unspecified: Secondary | ICD-10-CM

## 2022-01-18 DIAGNOSIS — C61 Malignant neoplasm of prostate: Secondary | ICD-10-CM

## 2022-01-20 ENCOUNTER — Ambulatory Visit (INDEPENDENT_AMBULATORY_CARE_PROVIDER_SITE_OTHER): Payer: Medicare Other | Admitting: Orthopedic Surgery

## 2022-01-20 ENCOUNTER — Ambulatory Visit (HOSPITAL_BASED_OUTPATIENT_CLINIC_OR_DEPARTMENT_OTHER): Payer: Medicare Other | Admitting: Orthopaedic Surgery

## 2022-01-20 ENCOUNTER — Encounter: Payer: Self-pay | Admitting: Orthopedic Surgery

## 2022-01-20 VITALS — BP 136/87 | HR 69 | Ht 73.0 in | Wt 295.0 lb

## 2022-01-20 DIAGNOSIS — M65331 Trigger finger, right middle finger: Secondary | ICD-10-CM | POA: Diagnosis not present

## 2022-01-20 MED ORDER — BETAMETHASONE SOD PHOS & ACET 6 (3-3) MG/ML IJ SUSP
6.0000 mg | INTRAMUSCULAR | Status: AC | PRN
  Administered 2022-01-20: 6 mg via INTRA_ARTICULAR

## 2022-01-20 MED ORDER — LIDOCAINE HCL 1 % IJ SOLN
1.0000 mL | INTRAMUSCULAR | Status: AC | PRN
  Administered 2022-01-20: 1 mL

## 2022-01-20 NOTE — Progress Notes (Signed)
Office Visit Note   Patient: David Parsons           Date of Birth: 14-May-1941           MRN: [id number] Visit Date: 01/20/2022              Requested by: [name], MD [address] [place],  [place] [address] PCP: [name], MD   Assessment & Plan: Visit Diagnoses:  1. Trigger middle finger of right hand     Plan: Patient is symptomatic triggering in the right middle finger.  We reviewed the nature of trigger finger as well as his diagnosis, prognosis and both conservative and surgical treatment options.  Patient had a corticosteroid injection 2 or 3 years ago with complete symptom relief until very recently.  He would like to try  another corticosteroid injection into the finger.  I can see him back in 8 weeks if he remains symptomatic.  Follow-Up Instructions: No follow-ups on file.   Orders:  No orders of the defined types were placed in this encounter.  No orders of the defined types were placed in this encounter.     Procedures: Hand/UE Inj: R long A1 for trigger finger on 01/20/2022 4:00 PM Indications: tendon swelling and therapeutic Details: 25 G needle, volar approach Medications: 1 mL lidocaine 1 %; 6 mg betamethasone acetate-betamethasone sodium phosphate 6 (3-3) MG/ML Procedure, treatment alternatives, risks and benefits explained, specific risks discussed. Consent was given by the patient. Immediately prior to procedure a time out was called to verify the correct patient, procedure, equipment, support staff and site/side marked as required. Patient was prepped and draped in the usual sterile fashion.      Clinical Data: No additional findings.   Subjective: Chief Complaint  Patient presents with   Right Middle Finger - Pain    NKI, RIGHT Handed, hx of trigger finger 2-3 years ago had injection that helped, Pain 0/10    Is a 81 year old right-hand-dominant male who presents with a right middle trigger finger.  This actually began  2 or 3 years ago.  He had a corticosteroid injection at that time with complete symptom relief until just recently.  He thinks his symptoms started maybe 2 weeks ago.  This progressively worsened and is becoming more and more bothersome for him.  It is not painful.   Review of Systems   Objective: Vital Signs: BP 136/87 (BP Location: Left Arm, Patient Position: Sitting)   Pulse 69   Ht '6\' 1"'$  (1.854 m)   Wt 295 lb (133.8 kg)   BMI 38.92 kg/m   Physical Exam Constitutional:      Appearance: Normal appearance.  Cardiovascular:     Rate and Rhythm: Normal rate.     Pulses: Normal pulses.  Pulmonary:     Effort: Pulmonary effort is normal.  Skin:    General: Skin is warm and dry.     Capillary Refill: Capillary refill takes less than 2 seconds.  Neurological:     Mental Status: He is alert.    Right Hand Exam   Tenderness  The patient is experiencing no tenderness.   Other  Erythema: absent Sensation: normal Pulse: present  Comments:  Palpable and visible triggering of middle finger with nodule at A1 pulley     Specialty Comments:  No specialty comments available.  Imaging: No results found.   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   Follow-up examination, following unspecified surgery 01/22/2012   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   Dystrophic nail 10/23/2011   Iron deficiency anemia 06/29/2011   Personal history of colonic polyps 06/29/2011   Family history of malignant neoplasm of gastrointestinal tract 06/29/2011   CAD, NATIVE VESSEL 01/18/2010   Obesity, Class III, BMI 40-49.9 (morbid obesity) ([place])  11/29/2009   Essential hypertension 04/05/2009   Hyperglycemia 04/05/2009   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   AORTIC INSUFFICIENCY, MODERATE 02/17/2009   CARDIOMYOPATHY, DILATED 02/17/2009   Thoracic aneurysm without mention of rupture 01/26/2009   Congestive heart failure ([place]) 01/04/2009   NONSPECIFIC ABNORMAL ELECTROCARDIOGRAM 01/04/2009   Past Medical History:  Diagnosis Date   Aortic insufficiency     02/16/09 Chest CT:  The aortic root has a diameter measuring 4.6 cm, image 64 of the coronal series.  The ascending    thoracic aorta has a diameter of 3.8 cm, image 65 of the coronal series.  At the level of the aortic arch    the thoracic aorta has a maximum diameter of 3.2 cm.  The descending thoracic aorta has a maximum           diameter of 3.2 cm.  There is no evidence for aortic disse         Aortic root enlargement (HCC)    The aortic root has a diameter measuring 4.6 cm, image 64 of the coronal series.  The ascending  thoracic aorta has a diameter of 3.8 cm, image 65 of the coronal series.  At the level of the aortic arch   the thoracic aorta has a maximum diameter of 3.2 cm.  The descending thoracic aorta has a maximum   diameter of 3.2 cm.  There is no evidence for aortic dissection.        CAD (coronary artery disease)    Cardiomyopathy     01/2009 - Cath - nonobs. dzs.  EF 35%.   CHF (congestive heart failure) (HCC)    Diabetes mellitus    Dyslipidemia 01/14/2013   Gallstones    s/p cholecystectomy     GERD (gastroesophageal reflux disease)    Glaucoma 10/05/2016   Heart murmur    Helicobacter pylori gastritis    (Tx Pylera 4/10)   Hypertension    Hypokalemia    Iron deficiency anemia    Malignant neoplasm of prostate ([facility]) 02/03/2021   Obesity, Class III, BMI 40-49.9 (morbid obesity) ([place])    Onychomycosis 03/09/2015   PONV (postoperative nausea and vomiting)    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   Biological Bentall Aortic  Root Replacement using 25 mm Edwards Magna Ease pericardial tissue valve and 28 mm Vascutek Gelweave Valsalva conduit with reimplantation of left main and right coronary artery and CABG x1 using SVG to RCA   Sleep apnea    occasionally wears CPAP at night    Family History  Problem Relation Age of Onset   Breast cancer Sister    Diabetes Sister    Colon cancer Brother 69       at least 31   Diabetes Brother    Cancer Brother  colon, prostate   Prostate cancer Brother    Diabetes Brother    Diabetes Sister    Cancer Sister        breast, colon, melanoma   Heart disease Sister    Hypertension Mother    Arthritis Mother    Kidney failure Sister    Colon cancer Sister    Breast cancer Sister    Kidney disease Sister        s/p transplant   Hypertension Sister    Diabetes Sister    Stroke Father    Hypertension Father     Past Surgical History:  Procedure Laterality Date   ADRENALECTOMY     BENTALL PROCEDURE  12/28/2011   Procedure: BENTALL PROCEDURE;  Surgeon: Rexene Alberts, MD;  Location: [facility];  Service: Open Heart Surgery;  Laterality: N/A;   CARDIAC CATHETERIZATION     2013   CARDIAC VALVE REPLACEMENT  12/28/2011   Bentall with tissue valve   CHOLECYSTECTOMY     COLONOSCOPY  09/25/2011   COLONOSCOPY W/ BIOPSIES AND POLYPECTOMY  11/04/2008   colon polyps, diverticulosis    CORONARY ARTERY BYPASS GRAFT  12/28/2011   Procedure: CORONARY ARTERY BYPASS GRAFTING (CABG);  Surgeon: Rexene Alberts, MD;  Location: [facility];  Service: Open Heart Surgery;  Laterality: N/A;  cabg x 1   LEFT AND RIGHT HEART CATHETERIZATION WITH CORONARY ANGIOGRAM N/A 11/22/2011   Procedure: LEFT AND RIGHT HEART CATHETERIZATION WITH CORONARY ANGIOGRAM;  Surgeon: Burnell Blanks, MD;  Location: [facility] CATH LAB;  Service: Cardiovascular;  Laterality: N/A;   MULTIPLE TOOTH EXTRACTIONS     PROSTATE BIOPSY Bilateral    TEE WITHOUT CARDIOVERSION  11/22/2011   Procedure: TRANSESOPHAGEAL  ECHOCARDIOGRAM (TEE);  Surgeon: Larey Dresser, MD;  Location: [facility];  Service: Cardiovascular;  Laterality: N/A;   UPPER GASTROINTESTINAL ENDOSCOPY  11/04/2008   w/bx, H pylori gastritis   UPPER GASTROINTESTINAL ENDOSCOPY  09/25/2011   Social History   Occupational History    Employer: [employer]    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

## 2022-01-31 ENCOUNTER — Ambulatory Visit (INDEPENDENT_AMBULATORY_CARE_PROVIDER_SITE_OTHER): Payer: Medicare Other | Admitting: Orthopedic Surgery

## 2022-01-31 ENCOUNTER — Ambulatory Visit (INDEPENDENT_AMBULATORY_CARE_PROVIDER_SITE_OTHER): Payer: Medicare Other

## 2022-01-31 DIAGNOSIS — M25531 Pain in right wrist: Secondary | ICD-10-CM

## 2022-01-31 MED ORDER — PREDNISONE 10 MG (21) PO TBPK: ORAL_TABLET | ORAL | 0 refills | Status: DC

## 2022-01-31 NOTE — Progress Notes (Signed)
Office Visit Note   Patient: David Parsons           Date of Birth: May 14, 1941           MRN: [id number] Visit Date: 01/31/2022              Requested by: [name], MD [address] [place],  [place] [address] PCP: [name], MD   Assessment & Plan: Visit Diagnoses:  1. Pain in right wrist     Plan: Discussed with patient that his acute onset of atraumatic wrist pain seems most consistent with some sort of crystalline arthropathy.  The oblique view of his wrist today does look like there may be some chondral calcification which could suggest pseudogout.  I will try him on a steroid taper.  I will see him back once he has completed the prednisone taper.   Follow-Up Instructions: No follow-ups on file.   Orders:  Orders Placed This Encounter  Procedures   XR Wrist Complete Right   No orders of the defined types were placed in this encounter.     Procedures: No procedures performed   Clinical Data: No additional findings.   Subjective: Chief Complaint  Patient presents with   Right Wrist - Pain, Edema    This is a 81 year old right-hand-dominant male who presents with acute, atraumatic right wrist pain.  Patient woke up yesterday morning with significant swelling and pain in the wrist.  The wrist is diffusely painful and painful to the touch.  He denies any injury to the wrist.  He denies any cuts, scrapes, or other breaks in the skin.  He has no personal history of gout but does note that his entire family has had gout at one time or another.  He denies any fevers, chills, or other systemic symptoms.  His right middle finger triggering has resolved following corticosteroid injection.    Review of Systems   Objective: Vital Signs: There were no vitals taken for this visit.  Physical Exam Constitutional:      Appearance: Normal appearance.  Cardiovascular:     Rate and Rhythm: Normal rate.     Pulses: Normal pulses.  Pulmonary:      Effort: Pulmonary effort is normal.  Skin:    General: Skin is warm and dry.     Capillary Refill: Capillary refill takes less than 2 seconds.  Neurological:     Mental Status: He is alert.     Right Hand Exam   Tenderness  Right hand tenderness location: Diffusely TTP across the wrist w/ moderate and obvious swelling.  Other  Erythema: absent Sensation: normal Pulse: present  Comments:  Wrist ROM severely limited by pain and swelling.  No cuts or scrapes.  Full ROM of fingers.       Specialty Comments:  No specialty comments available.  Imaging: No results found.   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   Follow-up examination, following unspecified surgery 01/22/2012   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   Dystrophic nail 10/23/2011   Iron deficiency anemia 06/29/2011   Personal history of colonic polyps 06/29/2011  Family history of malignant neoplasm of gastrointestinal tract 06/29/2011   CAD, NATIVE VESSEL 01/18/2010   Obesity, Class III, BMI 40-49.9 (morbid obesity) ([place]) 11/29/2009   Essential hypertension 04/05/2009   Hyperglycemia 04/05/2009   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   AORTIC INSUFFICIENCY, MODERATE 02/17/2009   CARDIOMYOPATHY, DILATED 02/17/2009   Thoracic aneurysm without mention of rupture 01/26/2009   Congestive heart failure ([place]) 01/04/2009   NONSPECIFIC ABNORMAL ELECTROCARDIOGRAM 01/04/2009   Past Medical History:  Diagnosis Date   Aortic insufficiency     02/16/09 Chest CT:  The aortic root has a diameter measuring 4.6 cm, image 64 of the coronal series.  The  ascending    thoracic aorta has a diameter of 3.8 cm, image 65 of the coronal series.  At the level of the aortic arch    the thoracic aorta has a maximum diameter of 3.2 cm.  The descending thoracic aorta has a maximum           diameter of 3.2 cm.  There is no evidence for aortic disse         Aortic root enlargement (HCC)    The aortic root has a diameter measuring 4.6 cm, image 64 of the coronal series.  The ascending  thoracic aorta has a diameter of 3.8 cm, image 65 of the coronal series.  At the level of the aortic arch   the thoracic aorta has a maximum diameter of 3.2 cm.  The descending thoracic aorta has a maximum   diameter of 3.2 cm.  There is no evidence for aortic dissection.        CAD (coronary artery disease)    Cardiomyopathy     01/2009 - Cath - nonobs. dzs.  EF 35%.   CHF (congestive heart failure) (HCC)    Diabetes mellitus    Dyslipidemia 01/14/2013   Gallstones    s/p cholecystectomy     GERD (gastroesophageal reflux disease)    Glaucoma 10/05/2016   Heart murmur    Helicobacter pylori gastritis    (Tx Pylera 4/10)   Hypertension    Hypokalemia    Iron deficiency anemia    Malignant neoplasm of prostate ([facility]) 02/03/2021   Obesity, Class III, BMI 40-49.9 (morbid obesity) ([place])    Onychomycosis 03/09/2015   PONV (postoperative nausea and vomiting)    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   Biological Bentall Aortic Root Replacement using 25 mm Edwards Magna Ease pericardial tissue valve and 28 mm Vascutek Gelweave Valsalva conduit with reimplantation of left main and right coronary artery and CABG x1 using SVG to RCA   Sleep apnea    occasionally wears CPAP at night    Family History  Problem Relation Age of Onset   Breast cancer Sister    Diabetes Sister    Colon cancer Brother 28       at least 53   Diabetes Brother    Cancer Brother        colon, prostate   Prostate cancer Brother    Diabetes Brother    Diabetes Sister    Cancer Sister         breast, colon, melanoma   Heart disease Sister    Hypertension Mother    Arthritis Mother    Kidney failure Sister    Colon cancer Sister    Breast cancer Sister    Kidney disease Sister        s/p transplant   Hypertension Sister  Diabetes Sister    Stroke Father    Hypertension Father     Past Surgical History:  Procedure Laterality Date   ADRENALECTOMY     BENTALL PROCEDURE  12/28/2011   Procedure: BENTALL PROCEDURE;  Surgeon: Rexene Alberts, MD;  Location: [facility];  Service: Open Heart Surgery;  Laterality: N/A;   CARDIAC CATHETERIZATION     2013   CARDIAC VALVE REPLACEMENT  12/28/2011   Bentall with tissue valve   CHOLECYSTECTOMY     COLONOSCOPY  09/25/2011   COLONOSCOPY W/ BIOPSIES AND POLYPECTOMY  11/04/2008   colon polyps, diverticulosis    CORONARY ARTERY BYPASS GRAFT  12/28/2011   Procedure: CORONARY ARTERY BYPASS GRAFTING (CABG);  Surgeon: Rexene Alberts, MD;  Location: [facility];  Service: Open Heart Surgery;  Laterality: N/A;  cabg x 1   LEFT AND RIGHT HEART CATHETERIZATION WITH CORONARY ANGIOGRAM N/A 11/22/2011   Procedure: LEFT AND RIGHT HEART CATHETERIZATION WITH CORONARY ANGIOGRAM;  Surgeon: Burnell Blanks, MD;  Location: [facility] CATH LAB;  Service: Cardiovascular;  Laterality: N/A;   MULTIPLE TOOTH EXTRACTIONS     PROSTATE BIOPSY Bilateral    TEE WITHOUT CARDIOVERSION  11/22/2011   Procedure: TRANSESOPHAGEAL ECHOCARDIOGRAM (TEE);  Surgeon: Larey Dresser, MD;  Location: [facility];  Service: Cardiovascular;  Laterality: N/A;   UPPER GASTROINTESTINAL ENDOSCOPY  11/04/2008   w/bx, H pylori gastritis   UPPER GASTROINTESTINAL ENDOSCOPY  09/25/2011   Social History   Occupational History    Employer: [employer]    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

## 2022-02-02 ENCOUNTER — Encounter: Payer: Self-pay | Admitting: Family Medicine

## 2022-02-22 ENCOUNTER — Other Ambulatory Visit: Payer: Self-pay | Admitting: Family Medicine

## 2022-03-02 ENCOUNTER — Other Ambulatory Visit: Payer: Self-pay | Admitting: Family Medicine

## 2022-03-02 ENCOUNTER — Other Ambulatory Visit: Payer: Self-pay | Admitting: Internal Medicine

## 2022-03-03 ENCOUNTER — Telehealth: Payer: Self-pay

## 2022-03-03 NOTE — Telephone Encounter (Signed)
Pt's wife called in stated that he still has thrush on his tongue and needs medication sent in.

## 2022-03-03 NOTE — Telephone Encounter (Signed)
Pt aware.

## 2022-03-10 MED ORDER — FLUCONAZOLE 150 MG PO TABS: 150.0000 mg | ORAL_TABLET | Freq: Every day | ORAL | 0 refills | Status: AC

## 2022-03-10 NOTE — Addendum Note (Signed)
Addended by: Kem Boroughs D on: 03/10/2022 04:41 PM   Modules accepted: Orders

## 2022-03-10 NOTE — Telephone Encounter (Signed)
Left detailed on machine with note below.  Rx was sent in.

## 2022-03-10 NOTE — Telephone Encounter (Signed)
Pt's wife called. She states pt's thrush is not getting better. She states he doesn't want to go to UC and doesn't  want to see another provider here. He has been seen by Lovena Le and Larose Kells. Wife is wanting Dr. Frederik Pear opinion.

## 2022-03-21 ENCOUNTER — Telehealth: Payer: Self-pay | Admitting: Pharmacist

## 2022-03-21 ENCOUNTER — Other Ambulatory Visit: Payer: Self-pay | Admitting: Family Medicine

## 2022-03-21 ENCOUNTER — Ambulatory Visit (INDEPENDENT_AMBULATORY_CARE_PROVIDER_SITE_OTHER): Payer: Medicare Other | Admitting: Pharmacist

## 2022-03-21 DIAGNOSIS — I509 Heart failure, unspecified: Secondary | ICD-10-CM

## 2022-03-21 DIAGNOSIS — F410 Panic disorder [episodic paroxysmal anxiety] without agoraphobia: Secondary | ICD-10-CM

## 2022-03-21 DIAGNOSIS — B370 Candidal stomatitis: Secondary | ICD-10-CM

## 2022-03-21 DIAGNOSIS — R739 Hyperglycemia, unspecified: Secondary | ICD-10-CM

## 2022-03-21 DIAGNOSIS — E785 Hyperlipidemia, unspecified: Secondary | ICD-10-CM

## 2022-03-21 DIAGNOSIS — I10 Essential (primary) hypertension: Secondary | ICD-10-CM

## 2022-03-21 MED ORDER — DIAZEPAM 5 MG PO TABS: ORAL_TABLET | ORAL | 0 refills | Status: DC

## 2022-03-21 NOTE — Chronic Care Management (AMB) (Signed)
Chronic Care Management Pharmacy Note  03/21/2022 Name:  David Parsons MRN:  [id number] DOB:  [birth year]  Summary:  Hypokalemia: Last potassium was low at 3.4 on 11/22/2021. Patient was to recheck in 3 to 4 week. Past due to rechecked. Patient denies muscle weakness or cramping. He is taking potassium supplement daily as prescribed.  Hypertension / CHF: Home and office blood pressures have been at goal. Home blood pressure 120 to 132/60's. Good adherence to blood pressure meds per patient and refill history. Patient has c/o urinary frequency that he felt was related to furosemide a few months ago. He reports today it is better. He did discuss with cardiologist about furosemide and triamterene-hydrochlorothiazide and urinary frequency but was encouraged to continue for CHF. Obesity / pre DM: patient is exercising 3 days per week and limiting caloric intake. He has lost about 15 lbs. Last A1c was 57% Change in taste / thrush: Patient reports symptoms started after he had top teeth pulled. He has top dentures but they have not been able to get a proper fit yet. Treated initially in April 2023 with nystatin suspension and then with diflucan 116m x 3 doses in July. Patient states initially improved but symptoms have returned - decrease in taste and white patches on tongue.  Health maintenance: Patient due to have tetanus booster and 2nd Shingrix.  Anxiety: prescribed diazepam to use if needed. Patient reports he takes about 4 or 5 times per year. He states he only has 1 tablet left and is requesting refill.   Plan:  Will forward information regarding taste / white patches on tongue to doctor of the day since Dr BCharlett Blakeis away. Patient likely needs to be seen since was treated twice without complete resolution. Can also recheck potassium when he is in office.  Will send request for diazepam refill also. Report weight gain of more than 3 lbs in 24 hours or 5 lbs in 1 week or symptoms of worsening HF to  office.  Reminded to get 2nd Shingrix and tetanus vaccine.   Subjective: David NICKSONis an 81y.o. year old male who is a primary patient of BMosie Lukes MD.  The CCM team was consulted for assistance with disease management and care coordination needs.    Engaged with patient by telephone for follow up visit in response to provider referral for pharmacy case management and/or care coordination services.   Consent to Services:  The patient was given information about Chronic Care Management services, agreed to services, and gave verbal consent prior to initiation of services.  Please see initial visit note for detailed documentation.   Patient Care Team: BMosie Lukes MD as PCP - General (Family Medicine) MBurnell Blanks MD as PCP - Cardiology (Cardiology) MBurnell Blanks MD as Consulting Physician (Cardiology) GGatha Mayer MD as Consulting Physician (Gastroenterology) LDruscilla Brownie MD as Consulting Physician (Dermatology) DCalvert Cantor MD as Consulting Physician (Ophthalmology) TCarolan Clines MD (Inactive) as Consulting Physician (Urology) MGardiner Barefoot DPM as Consulting Physician (Podiatry) ECherre Robins RShawano(Pharmacist) DFranchot Gallo MD as Consulting Physician (Urology) CDelice Bison LCharlestine Massed NP as Nurse Practitioner (Hematology and Oncology) DHarmon Pier RN as Registered Nurse MTyler Pita MD as Consulting Physician (Radiation Oncology)  Recent office visits: 12/14/2021 - PCP (Dr PLarose Kells acute visit for oral thrush. Not improved wiht Magic mouthwash. Prescribed nystatin suspension 4 times a day 12/05/2021 - PCP (Olevia Bowens NP) Acute visit seen for oral thrush. Prescried Magic mouthwash - swish and  spit 4 itmes a day as needed 11/22/2021 - PCP ([name]) Preventative health visit. Labs checked. No med changes noted.  04/11/2021 - Fam Med ([name]) F/U HTN. Reported dizziness improved.   Recent consult visits: 11/10/2021  - Cardio (Dr Angelena Form) F/U CAD and CHF. 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 No significant coronary atherosclerosis noted prior to his surgery. No chest pain. Continue ASA and beta blocker. He has not been on a statin. (no CAD noted on pre surgery cath). Ordered EKG and ECHO. 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 PSA was 3.05. No radiation related issued except urgency. Note that tamsulosin was not on urology office med list. F/U 4 months.  07/21/2021 - Oncology (nurse visit) to f/u prostate neoplasm 06/08/2021 - Rad Onc (Bruning, PAC) F/U post radiation for prostate cancer. continue to follow up with urology for ongoing PSA determinations - due 07/2021.    Hospital visits: None in previous 6 months  Objective:  Lab Results  Component Value Date   CREATININE 1.18 11/22/2021   CREATININE 1.00 04/18/2021   CREATININE 1.00 12/07/2020    Lab Results  Component Value Date   HGBA1C 5.7 11/22/2021   Last diabetic Eye exam: No results found for: "HMDIABEYEEXA"  Last diabetic Foot exam: No results found for: "HMDIABFOOTEX"      Component Value Date/Time   CHOL 155 11/22/2021 0950   TRIG 169.0 (H) 11/22/2021 0950   HDL 33.00 (L) 11/22/2021 0950   CHOLHDL 5 11/22/2021 0950   VLDL 33.8 11/22/2021 0950   LDLCALC 88 11/22/2021 0950       Latest Ref Rng & Units 11/22/2021    9:50 AM 04/18/2021   10:18 AM 12/07/2020    9:28 AM  Hepatic Function  Total Protein 6.0 - 8.3 g/dL 6.8  6.2  6.9   Albumin 3.5 - 5.2 g/dL 4.2  3.6  3.9   AST 0 - 37 U/L 17  15  12    ALT 0 - 53 U/L 15  11  12    Alk Phosphatase 39 - 117 U/L 61  58  75   Total Bilirubin 0.2 - 1.2 mg/dL 1.6  1.2  1.4     Lab Results  Component Value Date/Time    TSH 1.62 11/22/2021 09:50 AM   TSH 1.83 04/11/2021 02:39 PM       Latest Ref Rng & Units 11/22/2021    9:50 AM 04/11/2021    2:39 PM 12/07/2020    9:28 AM  CBC  WBC 4.0 - 10.5 K/uL 6.2  5.6  8.4   Hemoglobin 13.0 - 17.0 g/dL 13.9  12.3  13.9   Hematocrit 39.0 - 52.0 % 41.1  36.6  42.1   Platelets 150.0 - 400.0 K/uL 279.0  190.0  284.0     No results found for: "VD25OH"  Clinical ASCVD: Yes  The ASCVD Risk score (Arnett DK, et al., 2019) failed to calculate for the following reasons:   The 2019 ASCVD risk score is only valid for ages 71 to 92     Social History   Tobacco Use  Smoking Status Never  Smokeless Tobacco Never   BP Readings from Last 3 Encounters:  01/20/22 136/87  12/14/21 126/74  12/05/21 131/76  Pulse Readings from Last 3 Encounters:  01/20/22 69  12/14/21 68  12/05/21 73   Wt Readings from Last 3 Encounters:  01/20/22 295 lb (133.8 kg)  01/17/22 290 lb (131.5 kg)  12/14/21 295 lb 4 oz (133.9 kg)    Assessment: Review of patient past medical history, allergies, medications, health status, including review of consultants reports, laboratory and other test data, was performed as part of comprehensive evaluation and provision of chronic care management services.   SDOH:  (Social Determinants of Health) assessments and interventions performed:  SDOH Interventions    Flowsheet Row Most Recent Value  SDOH Interventions   Financial Strain Interventions Intervention Not Indicated  Housing Interventions Intervention Not Indicated  Physical Activity Interventions Other (Comments)  [recommended he increase frequency to 5 days per week]  Transportation Interventions Intervention Not Indicated         CCM Care Plan  Allergies  Allergen Reactions   Latex Hives    Latex gloves     Medications Reviewed Today     Reviewed by [name], RPH-CPP (Pharmacist) on 03/21/22 at 3  Med List Status: <None>   Medication Order Taking? Sig Documenting  Provider Last Dose Status Informant  amLODipine (NORVASC) 10 MG tablet [id number] Yes TAKE 1 TABLET BY MOUTH EVERY DAY [name], MD Taking Active   aspirin EC 81 MG tablet [id number] Yes Take 81 mg by mouth daily. [provider] Taking Active   carvedilol (COREG) 25 MG tablet [id number] Yes TAKE 1 TABLET BY MOUTH TWICE A DAY WITH FOOD [name], MD Taking Active   Cholecalciferol (VITAMIN D3) 50 MCG (2000 UT) CAPS [id number] Yes Take 1 capsule by mouth daily. [provider] Taking Active   clobetasol ointment (TEMOVATE) 0.05 % [id number] No Apply 1 application topically 2 (two) times daily as needed.  Patient not taking: Reported on 03/21/2022   [provider] Not Taking Active            Med Note (CANTER, Cheri Rous   Fri Aug 31, 2017 10:23 AM)    diazepam (VALIUM) 5 MG tablet [id number] Yes TAKE 1/2-2 TABLETS BY MOUTH DAILY AS NEEDED FOR ANXIETY. Copland, Gay Filler, MD Taking Active            Med Note Antony Contras, [place] B   Mon Sep 26, 2021 10:40 AM) Uses rarely - about 3 or 4 times per year  ferrous sulfate 325 (65 FE) MG tablet [id number] Yes TAKE 1 TABLET BY MOUTH 2 TIMES DAILY WITH A MEAL. [name], MD Taking Active   furosemide (LASIX) 40 MG tablet [id number] Yes Take 1 tablet (40 mg total) by mouth daily. Burnell Blanks, MD Taking Active   KLOR-CON M20 20 MEQ tablet [id number] Yes TAKE 3 TABLETS (60 MEQ TOTAL) BY MOUTH 3 (THREE) TIMES DAILY. [name], MD Taking Active   latanoprost (XALATAN) 0.005 % ophthalmic solution [id number] Yes 1 drop at bedtime. [provider] Taking Active   losartan (COZAAR) 100 MG tablet [id number] Yes TAKE 1 TABLET BY MOUTH EVERY DAY [name], MD Taking Active   Multiple Vitamin (MULITIVITAMIN WITH MINERALS) TABS [id number] Yes Take 1 tablet by mouth daily. [provider] Taking Active Spouse/Significant Other  omeprazole (PRILOSEC) 40 MG capsule [id number] Yes TAKE 1 CAPSULE BY MOUTH EVERY  DAY [name], MD Taking Active   Probiotic Product (PROBIOTIC DAILY PO) [id number] Yes Take 1 tablet by mouth daily. [provider] Taking Active  tamsulosin (FLOMAX) 0.4 MG CAPS capsule [id number] Yes Take 1 capsule (0.4 mg total) by mouth daily. Freeman Caldron, PA-C Taking Active            Med Note Barbaraann Boys Sep 26, 2021 10:49 AM) Patient wanted to keep on list - using as needed. Has not taken in last month. Is not on med list from urologist.  timolol (TIMOPTIC) 0.5 % ophthalmic solution [id number] Yes INSTILL 1 DROP INTO BOTH EYES TWICE A DAY [provider] Taking Active   triamterene-hydrochlorothiazide (MAXZIDE-25) 37.5-25 MG tablet [id number] Yes TAKE 1 TABLET BY MOUTH EVERY DAY [name], MD Taking Active             Patient Active Problem List   Diagnosis Date Noted   Malignant neoplasm of prostate ([place]) 02/03/2021   Trigger finger of right hand 09/22/2020   Anxiety attack 08/04/2019   Glaucoma 10/05/2016   Onychomycosis 03/09/2015   Hypokalemia 07/26/2014   Medicare annual wellness visit, subsequent 04/20/2014   Dyslipidemia 01/14/2013   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   Follow-up examination, following unspecified surgery 01/22/2012   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   Dystrophic nail 10/23/2011   Iron deficiency anemia 06/29/2011   Personal history of colonic polyps 06/29/2011   Family history of malignant neoplasm of gastrointestinal tract 06/29/2011   CAD, NATIVE VESSEL 01/18/2010   Obesity, Class III, BMI 40-49.9 (morbid obesity) ([place]) 11/29/2009   Essential hypertension 04/05/2009   Hyperglycemia 04/05/2009   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   AORTIC INSUFFICIENCY, MODERATE 02/17/2009   CARDIOMYOPATHY, DILATED 02/17/2009   Thoracic aneurysm without mention of rupture 01/26/2009   Congestive heart failure ([place]) 01/04/2009   NONSPECIFIC ABNORMAL ELECTROCARDIOGRAM 01/04/2009     Immunization History  Administered Date(s) Administered   Fluad Quad(high Dose 65+) 04/22/2019, 05/07/2020, 05/10/2021   Influenza Split 05/15/2011, 05/22/2012   Influenza Whole 06/07/2009, 05/11/2010   Influenza, High Dose Seasonal PF 04/22/2015, 05/07/2017, 05/14/2018   Influenza,inj,Quad PF,6+ Mos 05/09/2013, 04/20/2014   Influenza-Unspecified 05/17/2016   Moderna Covid-19 Vaccine Bivalent Booster 41yr & up 07/01/2021   Moderna Sars-Covid-2 Vaccination 09/20/2019, 10/28/2019, 07/06/2020   Pneumococcal Conjugate-13 05/09/2013   Pneumococcal Polysaccharide-23 04/16/2006, 04/22/2015   Td 04/14/2008   Zoster Recombinat (Shingrix) 11/15/2021    Conditions to be addressed/monitored: CHF, CAD, HTN, HLD, Anxiety, and glaucoma, prostate CA, elevated PSA; BPH; iron def anemia;  chronic hypokalemia; GERD  There are no care plans that you recently modified to display for this patient.     Medication Assistance: None required.  Patient affirms current coverage meets needs.  Patient's [facility] pharmacy [facility]:  [id number] [place]: [phone]: [phone]  Follow Up:  Patient agrees to Care Plan and Follow-up.  Plan: Telephone follow up appointment with care management team member scheduled for:  3 months  TaCherre RobinsPharmD Clinical Pharmacist [facility]

## 2022-03-21 NOTE — Telephone Encounter (Signed)
Had follow up Chronic Care Management visit with patient today. He reported Change in taste / thrush Patient reports symptoms initially started after he had top teeth pulled a few months ago. He has top dentures but they have not been able to get a proper fit yet.  Treated initially in April 2023 with nystatin suspension and then with diflucan '150mg'$  x 3 doses in July. Patient states symptoms initially improved after each treatment but symptoms have returned - decrease in taste and white patches on tongue.  Patient might need appointment for evaluation. If he comes into office, also consider rechecking potassium.   He also is requesting refill on diazepam. Patient reports he takes about 4 or 5 times per year.  See below for last refills  Refill History:    Dispensed Days Supply Quantity Provider Pharmacy  DIAZEPAM 5 MG TABLET 02/09/2020 5 10 each COPLAND,JESSICA CAMPBELL [id number]  DIAZEPAM 5 MG TABLET 07/29/2019 5 10 each BLYTH,STACEY ANNE [id number]

## 2022-03-21 NOTE — Telephone Encounter (Signed)
Patient called back about 15 minutes after my call. Completed Chronic Care Management follow up phone visit - see notes.

## 2022-03-21 NOTE — Patient Instructions (Signed)
Mr. Lemen I was so glad to speak with you today.  Below is a summary of your health goals and our recent visit. You can also view your update Chronic Care Management Care plan at the end of this After Visit Summary  Patient Goals/Self-Care Activities take medications as prescribed,  check blood pressure 2 to 3 times per week, document, and provide at future appointments,  target a minimum of 150 minutes of moderate intensity exercise weekly, and  engage in dietary modifications by continuing to avoid sodas, eat more vegetable and limit high fat foods Have potassium check at next office visit.  Recommend patient receive Tetanus booster in 2023 Recommend patient receive 2nd dose of Shingrix before 05/18/2022   As always if you have any questions or concerns especially regarding medications, please feel free to contact me either at the phone number below or with a MyChart message.   Keep up the good work!  [name], PharmD Clinical Pharmacist [place] [phone] (direct line)  [phone] (main office number)   The patient verbalized understanding of instructions, educational materials, and care plan provided today and agreed to receive a mailed copy of patient instructions, educational materials, and care plan.

## 2022-03-21 NOTE — Telephone Encounter (Signed)
  Care Management   Follow Up Note   03/21/2022 Name: David Parsons MRN: [id number] DOB: 1940-09-22   Referred by: [name], MD Reason for referral : No chief complaint on file.   An unsuccessful telephone outreach was attempted today. The patient was referred to the case management team for assistance with care management and care coordination.  Left message on patient's cell phone VM and home VM with contact numbers [phone] or [phone]  Follow Up Plan: The care management team will reach out to the patient again over the next 30 days.   [name], PharmD Clinical Pharmacist [facility]

## 2022-03-24 ENCOUNTER — Ambulatory Visit (INDEPENDENT_AMBULATORY_CARE_PROVIDER_SITE_OTHER): Payer: Medicare Other | Admitting: Podiatry

## 2022-03-24 DIAGNOSIS — B351 Tinea unguium: Secondary | ICD-10-CM

## 2022-03-24 DIAGNOSIS — M79675 Pain in left toe(s): Secondary | ICD-10-CM | POA: Diagnosis not present

## 2022-03-24 DIAGNOSIS — M79674 Pain in right toe(s): Secondary | ICD-10-CM | POA: Diagnosis not present

## 2022-03-24 NOTE — Progress Notes (Signed)
  Subjective:  Patient ID: David Parsons, male    DOB: Nov 27, 1940,  MRN: [id number]  David Parsons presents to clinic today for painful thick toenails that are difficult to trim. Pain interferes with ambulation. Aggravating factors include wearing enclosed shoe gear. Pain is relieved with periodic professional debridement.  New problem(s): None.   PCP is [name], MD , and last visit was  November 22, 2021  Allergies  Allergen Reactions   Latex Hives    Latex gloves     Review of Systems: Negative except as noted in the HPI.  Objective: No changes noted in today's physical examination. Constitutional David Parsons is a pleasant 81 y.o. African American male, morbidly obese in NAD. AAO x 3.   Vascular CFT immediate b/l LE. Palpable DP/PT pulses b/l LE. Digital hair absent b/l. Skin temperature gradient WNL b/l. No pain with calf compression b/l. No edema noted b/l. No cyanosis or clubbing noted b/l LE.  Neurologic Normal speech. Oriented to person, place, and time. Protective sensation intact 5/5 intact bilaterally with 10g monofilament b/l.  Dermatologic Pedal integument with normal turgor, texture and tone b/l LE. No open wounds b/l. No interdigital macerations b/l. Toenails 1-5 b/l elongated, thickened, discolored with subungual debris. +Tenderness with dorsal palpation of nailplates. Hyperkeratotic lesion(s) noted submet head 5 b/l.  Orthopedic: Normal muscle strength 5/5 to all lower extremity muscle groups bilaterally. Hammertoe deformity noted 2-5 b/l. Pes planus deformity noted bilateral LE. No pain, crepitus or joint limitation noted with ROM b/l LE.  Patient ambulates independently without assistive aids.   Radiographs: None    Latest Ref Rng & Units 11/22/2021    9:50 AM 04/11/2021    2:39 PM  Hemoglobin A1C  Hemoglobin-A1c 4.6 - 6.5 % 5.7  5.4    Assessment/Plan: 1. Pain due to onychomycosis of toenails of both feet   -Examined patient. -No new findings. No new  orders. -Medicare ABN on file for refusal of paring of corn(s)/callus(es)/porokeratos(es). Copy in patient chart. -Mycotic toenails 1-5 bilaterally were debrided in length and girth with sterile nail nippers and dremel without incident. -Patient/POA to call should there be question/concern in the interim.   Return in about 3 months (around 06/24/2022).  Marzetta Board, DPM

## 2022-04-01 ENCOUNTER — Encounter: Payer: Self-pay | Admitting: Podiatry

## 2022-04-06 DIAGNOSIS — H35371 Puckering of macula, right eye: Secondary | ICD-10-CM | POA: Diagnosis not present

## 2022-04-06 DIAGNOSIS — H401132 Primary open-angle glaucoma, bilateral, moderate stage: Secondary | ICD-10-CM | POA: Diagnosis not present

## 2022-04-06 DIAGNOSIS — H25813 Combined forms of age-related cataract, bilateral: Secondary | ICD-10-CM | POA: Diagnosis not present

## 2022-04-06 DIAGNOSIS — H04123 Dry eye syndrome of bilateral lacrimal glands: Secondary | ICD-10-CM | POA: Diagnosis not present

## 2022-04-06 DIAGNOSIS — H524 Presbyopia: Secondary | ICD-10-CM | POA: Diagnosis not present

## 2022-04-11 DIAGNOSIS — Z23 Encounter for immunization: Secondary | ICD-10-CM | POA: Diagnosis not present

## 2022-04-12 ENCOUNTER — Other Ambulatory Visit: Payer: Self-pay | Admitting: Family Medicine

## 2022-04-12 ENCOUNTER — Telehealth: Payer: Self-pay

## 2022-04-12 MED ORDER — NYSTATIN 100000 UNIT/ML MT SUSP: 5.0000 mL | Freq: Four times a day (QID) | OROMUCOSAL | 1 refills | Status: DC

## 2022-04-12 NOTE — Telephone Encounter (Signed)
He has had nystatin suspension and fluconazole in the last 3 months.  He feels that nystatin helped the most and is asking for a refill.  Will forward to PCP for review and recommendation.

## 2022-04-12 NOTE — Telephone Encounter (Signed)
Patient report that he has had white patches / coating on his tongue since he has teeth pulled. No pain but he cannot taste food like usual. He thinks that it is thrush.  He has a

## 2022-04-12 NOTE — Telephone Encounter (Signed)
Pt's wife requesting call back from Tammy when available- CB number: [phone]

## 2022-04-20 DIAGNOSIS — I509 Heart failure, unspecified: Secondary | ICD-10-CM

## 2022-04-20 DIAGNOSIS — I110 Hypertensive heart disease with heart failure: Secondary | ICD-10-CM | POA: Diagnosis not present

## 2022-05-04 ENCOUNTER — Ambulatory Visit (INDEPENDENT_AMBULATORY_CARE_PROVIDER_SITE_OTHER): Payer: Medicare Other | Admitting: Family Medicine

## 2022-05-04 VITALS — BP 138/78 | HR 69 | Temp 97.8°F | Resp 16 | Ht 72.0 in | Wt 276.4 lb

## 2022-05-04 DIAGNOSIS — I10 Essential (primary) hypertension: Secondary | ICD-10-CM | POA: Diagnosis not present

## 2022-05-04 DIAGNOSIS — I119 Hypertensive heart disease without heart failure: Secondary | ICD-10-CM

## 2022-05-04 DIAGNOSIS — B370 Candidal stomatitis: Secondary | ICD-10-CM | POA: Diagnosis not present

## 2022-05-04 DIAGNOSIS — R739 Hyperglycemia, unspecified: Secondary | ICD-10-CM | POA: Diagnosis not present

## 2022-05-04 DIAGNOSIS — I2510 Atherosclerotic heart disease of native coronary artery without angina pectoris: Secondary | ICD-10-CM | POA: Diagnosis not present

## 2022-05-04 DIAGNOSIS — E785 Hyperlipidemia, unspecified: Secondary | ICD-10-CM

## 2022-05-04 MED ORDER — NYSTATIN 100000 UNIT/ML MT SUSP: 5.0000 mL | Freq: Four times a day (QID) | OROMUCOSAL | 1 refills | Status: DC

## 2022-05-04 MED ORDER — FLUCONAZOLE 150 MG PO TABS: ORAL_TABLET | ORAL | 0 refills | Status: DC

## 2022-05-04 NOTE — Patient Instructions (Addendum)
RSV (respiratory syncitial virus) vaccine at pharmacy, once and done, Arexvy Covid booster when new version out late September At pharmacy High dose flu shot mid Sept to mid Oct   Put 2 weeks between shots  Orgain high protein beverages   Less starchy carbohydrates ie white carbs, bread, pasta, rice, potato etc  Increase healthy fats  Regular oral hygiene followed tongue blade and non alcohol mouthwash

## 2022-05-05 ENCOUNTER — Telehealth: Payer: Self-pay | Admitting: Family Medicine

## 2022-05-05 ENCOUNTER — Other Ambulatory Visit: Payer: Self-pay

## 2022-05-05 DIAGNOSIS — I119 Hypertensive heart disease without heart failure: Secondary | ICD-10-CM

## 2022-05-05 LAB — CBC
HCT: 38.9 % — ABNORMAL LOW (ref 39.0–52.0)
Hemoglobin: 13 g/dL (ref 13.0–17.0)
MCHC: 33.5 g/dL (ref 30.0–36.0)
MCV: 88.4 fl (ref 78.0–100.0)
Platelets: 298 10*3/uL (ref 150.0–400.0)
RBC: 4.4 Mil/uL (ref 4.22–5.81)
RDW: 13.6 % (ref 11.5–15.5)
WBC: 6.7 10*3/uL (ref 4.0–10.5)

## 2022-05-05 LAB — LIPID PANEL
Cholesterol: 166 mg/dL (ref 0–200)
HDL: 33.5 mg/dL — ABNORMAL LOW (ref >39.00)
LDL Cholesterol: 99 mg/dL (ref 0–99)
NonHDL: 132.91
Total CHOL/HDL Ratio: 5
Triglycerides: 168 mg/dL — ABNORMAL HIGH (ref 0.0–149.0)
VLDL: 33.6 mg/dL (ref 0.0–40.0)

## 2022-05-05 LAB — COMPREHENSIVE METABOLIC PANEL
ALT: 11 U/L (ref 0–53)
AST: 14 U/L (ref 0–37)
Albumin: 4.1 g/dL (ref 3.5–5.2)
Alkaline Phosphatase: 70 U/L (ref 39–117)
BUN: 16 mg/dL (ref 6–23)
CO2: 32 mEq/L (ref 19–32)
Calcium: 9.8 mg/dL (ref 8.4–10.5)
Chloride: 98 mEq/L (ref 96–112)
Creatinine, Ser: 1.59 mg/dL — ABNORMAL HIGH (ref 0.40–1.50)
GFR: 40.83 mL/min — ABNORMAL LOW (ref >60.00)
Glucose, Bld: 96 mg/dL (ref 70–99)
Potassium: 4.2 mEq/L (ref 3.5–5.1)
Sodium: 139 mEq/L (ref 135–145)
Total Bilirubin: 1.6 mg/dL — ABNORMAL HIGH (ref 0.2–1.2)
Total Protein: 6.9 g/dL (ref 6.0–8.3)

## 2022-05-05 LAB — TSH: TSH: 1.57 u[IU]/mL (ref 0.35–5.50)

## 2022-05-05 LAB — HEMOGLOBIN A1C: Hgb A1c MFr Bld: 5.4 % (ref 4.6–6.5)

## 2022-05-05 NOTE — Telephone Encounter (Signed)
Patient's wife calling regarding the fluconazole (DIFLUCAN) 150 MG tablet [[id number]] prescription. In the flyer that comes with medication, it says to ask your doctor if you have signs of low heart rate. Patient has had open heart surgery so he wants to know if it's okay to take medication. Please call to advise.

## 2022-05-05 NOTE — Telephone Encounter (Signed)
Called pt was advised  

## 2022-05-07 NOTE — Progress Notes (Signed)
Subjective:    Patient ID: David Parsons, male    DOB: 1941/02/08, 81 y.o.   MRN: [id number]  Chief Complaint  Patient presents with   Thrush on Tongue    Here for thrush on Tongue    HPI Patient is in today for follow up on chronic medical concerns. He is here today with his wife. They note his appetite remains poor for a couple of months now. No vomiting but some occasional nausea. No notable abominal pain or changes in bowel or urinary symptoms. No fevers or chills. He did have his teeth pulled and has had trouble eating and with his appetite since then. He continues to struggle with oral thrush despite oral treatments. Denies CP/palp/SOB/HA/congestion/fevers/GI or GU c/o. Taking meds as prescribed   Past Medical History:  Diagnosis Date   Aortic insufficiency     02/16/09 Chest CT:  The aortic root has a diameter measuring 4.6 cm, image 64 of the coronal series.  The ascending    thoracic aorta has a diameter of 3.8 cm, image 65 of the coronal series.  At the level of the aortic arch    the thoracic aorta has a maximum diameter of 3.2 cm.  The descending thoracic aorta has a maximum           diameter of 3.2 cm.  There is no evidence for aortic disse         Aortic root enlargement (HCC)    The aortic root has a diameter measuring 4.6 cm, image 64 of the coronal series.  The ascending  thoracic aorta has a diameter of 3.8 cm, image 65 of the coronal series.  At the level of the aortic arch   the thoracic aorta has a maximum diameter of 3.2 cm.  The descending thoracic aorta has a maximum   diameter of 3.2 cm.  There is no evidence for aortic dissection.        CAD (coronary artery disease)    Cardiomyopathy     01/2009 - Cath - nonobs. dzs.  EF 35%.   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   Biological Bentall Aortic Root Replacement using 25 mm Edwards Magna Ease pericardial tissue valve and 28 mm Vascutek Gelweave Valsalva conduit with reimplantation of left main and right coronary artery and CABG x1 using SVG to RCA   Sleep apnea    occasionally wears CPAP at night    Past Surgical History:  Procedure Laterality Date   ADRENALECTOMY     BENTALL PROCEDURE  12/28/2011   Procedure: BENTALL PROCEDURE;  Surgeon: Rexene Alberts, MD;  Location: [facility];  Service: Open Heart Surgery;  Laterality: N/A;   CARDIAC CATHETERIZATION     2013   CARDIAC VALVE REPLACEMENT  12/28/2011   Bentall with tissue valve   CHOLECYSTECTOMY     COLONOSCOPY  09/25/2011   COLONOSCOPY W/ BIOPSIES AND POLYPECTOMY  11/04/2008   colon polyps, diverticulosis    CORONARY ARTERY BYPASS GRAFT  12/28/2011   Procedure: CORONARY ARTERY BYPASS GRAFTING (CABG);  Surgeon: Rexene Alberts, MD;  Location: [facility];  Service: Open Heart Surgery;  Laterality: N/A;  cabg x 1   LEFT AND RIGHT HEART CATHETERIZATION WITH CORONARY ANGIOGRAM N/A 11/22/2011   Procedure: LEFT AND RIGHT HEART CATHETERIZATION WITH CORONARY ANGIOGRAM;  Surgeon: Burnell Blanks, MD;  [facility] C[facility] LAB;  Service: Cardiovascular;  Laterality: N/A;   MULTIPLE TOOTH EXTRACTIONS     PROSTATE BIOPSY Bilateral    TEE WITHOUT CARDIOVERSION  11/22/2011   Procedure: TRANSESOPHAGEAL ECHOCARDIOGRAM (TEE);  Surgeon: Larey Dresser, MD;  Location: [facility] ENDOSCOPY;  Service: Cardiovascular;  Laterality: N/A;   UPPER GASTROINTESTINAL ENDOSCOPY  11/04/2008   w/bx, H pylori gastritis   UPPER GASTROINTESTINAL ENDOSCOPY  09/25/2011    Family  History  Problem Relation Age of Onset   Breast cancer Sister    Diabetes Sister    Colon cancer Brother 17       at least 35   Diabetes Brother    Cancer Brother        colon, prostate   Prostate cancer Brother    Diabetes Brother    Diabetes Sister    Cancer Sister        breast, colon, melanoma   Heart disease Sister    Hypertension Mother    Arthritis Mother    Kidney failure Sister    Colon cancer Sister    Breast cancer Sister    Kidney disease Sister        s/p transplant   Hypertension Sister    Diabetes Sister    Stroke Father    Hypertension Father     Social History   Socioeconomic History   Marital status: Married    Spouse name: [name]   Number of children: 0   Years of education: Not on file   Highest education level: Not on file  Occupational History    Employer: [employer]    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  He  lives a very sedentary lifestyle.  He is a nonsmoker.  He denies alcohol consumption.    Social Determinants of Health   Financial Resource Strain: Low Risk  (03/21/2022)   Overall Financial Resource Strain (CARDIA)    Difficulty of Paying Living Expenses: Not hard at all  Food Insecurity: No Food Insecurity (08/02/2021)   Hunger Vital Sign    Worried About Running Out of Food in the Last Year: Never true    Ran Out of Food in the Last Year: Never true  Transportation Needs: No Transportation Needs (03/21/2022)   PRAPARE - Hydrologist (Medical): No    Lack of  Transportation (Non-Medical): No  Physical Activity: Insufficiently Active (03/21/2022)   Exercise Vital Sign    Days of Exercise per Week: 3 days  Minutes of Exercise per Session: 40 min  Stress: No Stress Concern Present (08/02/2021)   [place]    Feeling of Stress : Not at all  Social Connections: [place] (08/02/2021)   Social Connection and Isolation Panel [NHANES]    Frequency of Communication with Friends and Family: More than three times a week    Frequency of Social Gatherings with Friends and Family: Three times a week    Attends Religious Services: More than 4 times per year    Active Member of Clubs or Organizations: Yes    Attends Archivist Meetings: More than 4 times per year    Marital Status: Married  Human resources officer Violence: Not At Risk (08/02/2021)   Humiliation, Afraid, Rape, and Kick questionnaire    Fear of Current or Ex-Partner: No    Emotionally Abused: No    Physically Abused: No    Sexually Abused: No    Outpatient Medications Prior to Visit  Medication Sig Dispense Refill   amLODipine (NORVASC) 10 MG tablet TAKE 1 TABLET BY MOUTH EVERY DAY 90 tablet 1   aspirin EC 81 MG tablet Take 81 mg by mouth daily.     carvedilol (COREG) 25 MG tablet TAKE 1 TABLET BY MOUTH TWICE A DAY WITH FOOD 180 tablet 1   Cholecalciferol (VITAMIN D3) 50 MCG (2000 UT) CAPS Take 1 capsule by mouth daily.     diazepam (VALIUM) 5 MG tablet TAKE 1/2-2 TABLETS BY MOUTH DAILY AS NEEDED FOR ANXIETY. 10 tablet 0   ferrous sulfate 325 (65 FE) MG tablet TAKE 1 TABLET BY MOUTH 2 TIMES DAILY WITH A MEAL. 180 tablet 2   furosemide (LASIX) 40 MG tablet Take 1 tablet (40 mg total) by mouth daily. 180 tablet 3   KLOR-CON M20 20 MEQ tablet TAKE 3 TABLETS (60 MEQ TOTAL) BY MOUTH 3 (THREE) TIMES DAILY. 810 tablet 1   latanoprost (XALATAN) 0.005 % ophthalmic solution 1 drop at bedtime.     losartan (COZAAR) 100  MG tablet TAKE 1 TABLET BY MOUTH EVERY DAY 90 tablet 0   Multiple Vitamin (MULITIVITAMIN WITH MINERALS) TABS Take 1 tablet by mouth daily.     omeprazole (PRILOSEC) 40 MG capsule TAKE 1 CAPSULE BY MOUTH EVERY DAY 90 capsule 1   Probiotic Product (PROBIOTIC DAILY PO) Take 1 tablet by mouth daily.     tamsulosin (FLOMAX) 0.4 MG CAPS capsule Take 1 capsule (0.4 mg total) by mouth daily. 30 capsule 3   timolol (TIMOPTIC) 0.5 % ophthalmic solution INSTILL 1 DROP INTO BOTH EYES TWICE A DAY     triamcinolone ointment (KENALOG) 0.1 % SMARTSIG:1 Topical Daily     triamterene-hydrochlorothiazide (MAXZIDE-25) 37.5-25 MG tablet TAKE 1 TABLET BY MOUTH EVERY DAY 90 tablet 2   nystatin (MYCOSTATIN) 100000 UNIT/ML suspension Take 5 mLs (500,000 Units total) by mouth 4 (four) times daily. 473 mL 1   No facility-administered medications prior to visit.    Allergies  Allergen Reactions   Latex Hives    Latex gloves     Review of Systems  Constitutional:  Positive for malaise/fatigue. Negative for fever.  HENT:  Negative for congestion.   Eyes:  Negative for blurred vision.  Respiratory:  Negative for shortness of breath.   Cardiovascular:  Negative for chest pain, palpitations and leg swelling.  Gastrointestinal:  Positive for nausea. Negative for blood in stool, constipation, diarrhea and melena.  Genitourinary:  Negative for dysuria and frequency.  Musculoskeletal:  Negative for falls.  Skin:  Negative for rash.  Neurological:  Negative for dizziness, loss of consciousness and headaches.  Endo/Heme/Allergies:  Negative for environmental allergies.  Psychiatric/Behavioral:  Negative for depression. The patient is not nervous/anxious.        Objective:    Physical Exam Constitutional:      General: He is not in acute distress.    Appearance: Normal appearance. He is not ill-appearing or toxic-appearing.  HENT:     Head: Normocephalic and atraumatic.     Right Ear: External ear normal.      Left Ear: External ear normal.     Nose: Nose normal.     Mouth/Throat:     Comments: Tongue with white plaque noted Eyes:     General:        Right eye: No discharge.        Left eye: No discharge.  Cardiovascular:     Rate and Rhythm: Normal rate.     Heart sounds: No murmur heard. Pulmonary:     Effort: Pulmonary effort is normal.  Musculoskeletal:        General: Normal range of motion.     Cervical back: Normal range of motion.  Skin:    Findings: No rash.  Neurological:     Mental Status: He is alert and oriented to person, place, and time.  Psychiatric:        Behavior: Behavior normal.     BP 138/78 (BP Location: Right Arm, Patient Position: Sitting, Cuff Size: Normal)   Pulse 69   Temp 97.8 F (36.6 C) (Oral)   Resp 16   Ht 6' (1.829 m)   Wt 276 lb 6.4 oz (125.4 kg)   SpO2 99%   BMI 37.49 kg/m  Wt Readings from Last 3 Encounters:  05/04/22 276 lb 6.4 oz (125.4 kg)  01/20/22 295 lb (133.8 kg)  01/17/22 290 lb (131.5 kg)    Diabetic Foot Exam - Simple   No data filed    Lab Results  Component Value Date   WBC 6.7 05/04/2022   HGB 13.0 05/04/2022   HCT 38.9 (L) 05/04/2022   PLT 298.0 05/04/2022   GLUCOSE 96 05/04/2022   CHOL 166 05/04/2022   TRIG 168.0 (H) 05/04/2022   HDL 33.50 (L) 05/04/2022   LDLCALC 99 05/04/2022   ALT 11 05/04/2022   AST 14 05/04/2022   NA 139 05/04/2022   K 4.2 05/04/2022   CL 98 05/04/2022   CREATININE 1.59 (H) 05/04/2022   BUN 16 05/04/2022   CO2 32 05/04/2022   TSH 1.57 05/04/2022   PSA 23.37 (H) 05/10/2020   INR 1.21 01/02/2012   HGBA1C 5.4 05/04/2022    Lab Results  Component Value Date   TSH 1.57 05/04/2022   Lab Results  Component Value Date   WBC 6.7 05/04/2022   HGB 13.0 05/04/2022   HCT 38.9 (L) 05/04/2022   MCV 88.4 05/04/2022   PLT 298.0 05/04/2022   Lab Results  Component Value Date   NA 139 05/04/2022   K 4.2 05/04/2022   CO2 32 05/04/2022   GLUCOSE 96 05/04/2022   BUN 16 05/04/2022    CREATININE 1.59 (H) 05/04/2022   BILITOT 1.6 (H) 05/04/2022   ALKPHOS 70 05/04/2022   AST 14 05/04/2022   ALT 11 05/04/2022   PROT 6.9 05/04/2022   ALBUMIN 4.1 05/04/2022   CALCIUM 9.8 05/04/2022   GFR 40.83 (L) 05/04/2022   Lab Results  Component  Value Date   CHOL 166 05/04/2022   Lab Results  Component Value Date   HDL 33.50 (L) 05/04/2022   Lab Results  Component Value Date   LDLCALC 99 05/04/2022   Lab Results  Component Value Date   TRIG 168.0 (H) 05/04/2022   Lab Results  Component Value Date   CHOLHDL 5 05/04/2022   Lab Results  Component Value Date   HGBA1C 5.4 05/04/2022       Assessment & Plan:   Problem List Items Addressed This Visit     Essential hypertension    Well controlled, no changes to meds. Encouraged heart healthy diet such as the DASH diet and exercise as tolerated.       Relevant Orders   CBC (Completed)   Comprehensive metabolic panel (Completed)   TSH (Completed)   Hypertensive heart disease without heart failure   Relevant Orders   CBC (Completed)   Comprehensive metabolic panel (Completed)   TSH (Completed)   Hyperglycemia - Primary    hgba1c acceptable, minimize simple carbs. Increase exercise as tolerated.       Relevant Orders   Hemoglobin A1c (Completed)   Thrush, oral    Has failed oral treatments, add Diflucan 150 mg po daily x 3 days and repeat once in a week. Continue Nystatin suspension. Swish and spit qid on other days for next couple of weeks. Minimize simple carbohydrates and increase protein intake      Relevant Medications   nystatin (MYCOSTATIN) 100000 UNIT/ML suspension   fluconazole (DIFLUCAN) 150 MG tablet   Dyslipidemia    Encourage heart healthy diet such as MIND or DASH diet, increase exercise, avoid trans fats, simple carbohydrates and processed foods, consider a krill or fish or flaxseed oil cap daily.       Relevant Orders   Lipid panel (Completed)    I am having David Parsons. David Parsons start on  fluconazole. I am also having him maintain his multivitamin with minerals, aspirin EC, timolol, tamsulosin, vitamin D3, triamterene-hydrochlorothiazide, latanoprost, furosemide, [place]-Con M20, omeprazole, Probiotic Product (PROBIOTIC DAILY PO), carvedilol, amLODipine, losartan, ferrous sulfate, diazepam, triamcinolone ointment, and nystatin.  Meds ordered this encounter  Medications   nystatin (MYCOSTATIN) 100000 UNIT/ML suspension    Sig: Take 5 mLs (500,000 Units total) by mouth 4 (four) times daily.    Dispense:  473 mL    Refill:  1   fluconazole (DIFLUCAN) 150 MG tablet    Sig: 1 tab po daily x 3 days can repeat  course in 1 week x 3 more doses    Dispense:  6 tablet    Refill:  0     [name], MD

## 2022-05-07 NOTE — Assessment & Plan Note (Signed)
hgba1c acceptable, minimize simple carbs. Increase exercise as tolerated.  

## 2022-05-07 NOTE — Assessment & Plan Note (Signed)
Well controlled, no changes to meds. Encouraged heart healthy diet such as the DASH diet and exercise as tolerated.  °

## 2022-05-07 NOTE — Assessment & Plan Note (Signed)
Encourage heart healthy diet such as MIND or DASH diet, increase exercise, avoid trans fats, simple carbohydrates and processed foods, consider a krill or fish or flaxseed oil cap daily.  °

## 2022-05-07 NOTE — Assessment & Plan Note (Signed)
Has failed oral treatments, add Diflucan 150 mg po daily x 3 days and repeat once in a week. Continue Nystatin suspension. Swish and spit qid on other days for next couple of weeks. Minimize simple carbohydrates and increase protein intake

## 2022-05-12 DIAGNOSIS — C61 Malignant neoplasm of prostate: Secondary | ICD-10-CM | POA: Diagnosis not present

## 2022-05-15 ENCOUNTER — Encounter: Payer: Self-pay | Admitting: Family Medicine

## 2022-05-17 ENCOUNTER — Other Ambulatory Visit (INDEPENDENT_AMBULATORY_CARE_PROVIDER_SITE_OTHER): Payer: Medicare Other

## 2022-05-17 ENCOUNTER — Other Ambulatory Visit: Payer: Medicare Other

## 2022-05-17 ENCOUNTER — Other Ambulatory Visit: Payer: Self-pay

## 2022-05-17 DIAGNOSIS — I119 Hypertensive heart disease without heart failure: Secondary | ICD-10-CM

## 2022-05-17 LAB — COMPREHENSIVE METABOLIC PANEL
ALT: 10 U/L (ref 0–53)
AST: 13 U/L (ref 0–37)
Albumin: 4.1 g/dL (ref 3.5–5.2)
Alkaline Phosphatase: 69 U/L (ref 39–117)
BUN: 11 mg/dL (ref 6–23)
CO2: 30 mEq/L (ref 19–32)
Calcium: 9.7 mg/dL (ref 8.4–10.5)
Chloride: 97 mEq/L (ref 96–112)
Creatinine, Ser: 1.25 mg/dL (ref 0.40–1.50)
GFR: 54.49 mL/min — ABNORMAL LOW (ref >60.00)
Glucose, Bld: 122 mg/dL — ABNORMAL HIGH (ref 70–99)
Potassium: 3.4 mEq/L — ABNORMAL LOW (ref 3.5–5.1)
Sodium: 137 mEq/L (ref 135–145)
Total Bilirubin: 1.1 mg/dL (ref 0.2–1.2)
Total Protein: 6.8 g/dL (ref 6.0–8.3)

## 2022-05-17 NOTE — Telephone Encounter (Signed)
Called pt was advised and pt stated no haven't had MRI since then . Pt stated he doing well other then thrush. No abdominal pain are any of the other things right now.

## 2022-05-18 ENCOUNTER — Other Ambulatory Visit: Payer: Self-pay | Admitting: Family Medicine

## 2022-05-18 ENCOUNTER — Other Ambulatory Visit (HOSPITAL_BASED_OUTPATIENT_CLINIC_OR_DEPARTMENT_OTHER): Payer: Self-pay

## 2022-05-18 DIAGNOSIS — N2889 Other specified disorders of kidney and ureter: Secondary | ICD-10-CM

## 2022-05-18 DIAGNOSIS — R160 Hepatomegaly, not elsewhere classified: Secondary | ICD-10-CM

## 2022-05-18 NOTE — Telephone Encounter (Signed)
The pt has called back stating they wanted the updated labs. I have given them that information and they stated understanding. Pt and his wife reports that the trush is still present and they have done everything that was recommended including the tongue scrapping and salt water gargling. They have not finished the diflucan one tab for the next 3 days. Also we are ordering the MRI, and someone will contact them to get that coordinated. Pt and his wife stated understanding.    Dr. Charlett Blake   Please order the MRI.

## 2022-05-19 DIAGNOSIS — N400 Enlarged prostate without lower urinary tract symptoms: Secondary | ICD-10-CM | POA: Diagnosis not present

## 2022-05-19 DIAGNOSIS — Z8546 Personal history of malignant neoplasm of prostate: Secondary | ICD-10-CM | POA: Diagnosis not present

## 2022-05-25 ENCOUNTER — Encounter: Payer: Self-pay | Admitting: *Deleted

## 2022-05-25 ENCOUNTER — Telehealth (INDEPENDENT_AMBULATORY_CARE_PROVIDER_SITE_OTHER): Payer: Medicare Other | Admitting: Family Medicine

## 2022-05-25 DIAGNOSIS — B370 Candidal stomatitis: Secondary | ICD-10-CM

## 2022-05-25 DIAGNOSIS — E785 Hyperlipidemia, unspecified: Secondary | ICD-10-CM

## 2022-05-25 DIAGNOSIS — D649 Anemia, unspecified: Secondary | ICD-10-CM | POA: Diagnosis not present

## 2022-05-25 DIAGNOSIS — E66813 Obesity, class 3: Secondary | ICD-10-CM

## 2022-05-25 DIAGNOSIS — I10 Essential (primary) hypertension: Secondary | ICD-10-CM | POA: Diagnosis not present

## 2022-05-25 DIAGNOSIS — I2510 Atherosclerotic heart disease of native coronary artery without angina pectoris: Secondary | ICD-10-CM | POA: Diagnosis not present

## 2022-05-25 DIAGNOSIS — R739 Hyperglycemia, unspecified: Secondary | ICD-10-CM

## 2022-05-25 MED ORDER — LOSARTAN POTASSIUM 50 MG PO TABS: 50.0000 mg | ORAL_TABLET | Freq: Every day | ORAL | 1 refills | Status: DC

## 2022-05-25 NOTE — Assessment & Plan Note (Signed)
Monitor and report any concerns, no changes to meds. Encouraged heart healthy diet such as the DASH diet and exercise as tolerated.  ?

## 2022-05-25 NOTE — Assessment & Plan Note (Signed)
Mild, new, asymptomatic, continue to monitor

## 2022-05-25 NOTE — Assessment & Plan Note (Addendum)
Has been treated with Nystatin and Fluconazole. Encouraged tongue blade and non alcohol mouthwash, saline gargles. He notes he is eating yogurt daily and taking probiotics and this seems to be helping.

## 2022-05-25 NOTE — Assessment & Plan Note (Signed)
hgba1c acceptable, minimize simple carbs. Increase exercise as tolerated.  

## 2022-05-25 NOTE — Assessment & Plan Note (Signed)
Maintain PURE, DASH or MIND diet, decrease po intake and increase exercise as tolerated. Needs 7-8 hours of sleep nightly. Avoid trans fats, eat small, frequent meals every 4-5 hours with lean proteins, complex carbs and healthy fats. Minimize simple carbs, high fat foods and processed foods.

## 2022-05-26 NOTE — Assessment & Plan Note (Signed)
Encourage heart healthy diet such as MIND or DASH diet, increase exercise, avoid trans fats, simple carbohydrates and processed foods, consider a krill or fish or flaxseed oil cap daily.  °

## 2022-05-26 NOTE — Progress Notes (Signed)
MyChart Video Visit    Virtual Visit via Video Note   This visit type was conducted due to national recommendations for restrictions regarding the COVID-19 Pandemic (e.g. social distancing) in an effort to limit this patient's exposure and mitigate transmission in our community. This patient is at least at moderate risk for complications without adequate follow up. This format is felt to be most appropriate for this patient at this time. Physical exam was limited by quality of the video and audio technology used for the visit. [name], CMA was able to get the patient set up on a video visit.  Patient location: home Patient and provider in visit Provider location: Office  I discussed the limitations of evaluation and management by telemedicine and the availability of in person appointments. The patient expressed understanding and agreed to proceed.  Visit Date: 05/25/2022  Today's healthcare provider: [name], MD     Subjective:    Patient ID: David Parsons, male    DOB: 1941-06-08, 81 y.o.   MRN: [id number]  No chief complaint on file.   HPI Patient is in today for follow-up on chronic medical concerns.  Overall he is doing well.  He is at his home with his wife.  They know his thrush seems to be responding to eating yogurt daily and he is also taking probiotics.  He is feeling better.  He has recently seen his urologist Dr. Diona Fanti and his prostate cancer has responded well to treatment and he is feeling much better.  No acute concerns noted. Denies CP/palp/SOB/HA/congestion/fevers/GI or GU c/o. Taking meds as prescribed   Past Medical History:  Diagnosis Date   Aortic insufficiency     02/16/09 Chest CT:  The aortic root has a diameter measuring 4.6 cm, image 64 of the coronal series.  The ascending    thoracic aorta has a diameter of 3.8 cm, image 65 of the coronal series.  At the level of the aortic arch    the thoracic aorta has a maximum diameter of 3.2 cm.  The  descending thoracic aorta has a maximum           diameter of 3.2 cm.  There is no evidence for aortic disse         Aortic root enlargement (HCC)    The aortic root has a diameter measuring 4.6 cm, image 64 of the coronal series.  The ascending  thoracic aorta has a diameter of 3.8 cm, image 65 of the coronal series.  At the level of the aortic arch   the thoracic aorta has a maximum diameter of 3.2 cm.  The descending thoracic aorta has a maximum   diameter of 3.2 cm.  There is no evidence for aortic dissection.        CAD (coronary artery disease)    Cardiomyopathy     01/2009 - Cath - nonobs. dzs.  EF 35%.   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   Biological Bentall Aortic Root Replacement using 25 mm Edwards Magna Ease pericardial tissue valve and 28 mm Vascutek Gelweave Valsalva conduit with reimplantation of left main and right coronary artery and CABG x1 using SVG to RCA   Sleep apnea    occasionally wears CPAP at night    Past Surgical History:  Procedure Laterality Date   ADRENALECTOMY     BENTALL PROCEDURE  12/28/2011   Procedure: BENTALL PROCEDURE;  Surgeon: Rexene Alberts, MD;  Location: [facility];  Service: Open Heart Surgery;  Laterality: N/A;   CARDIAC CATHETERIZATION     2013   CARDIAC VALVE REPLACEMENT  12/28/2011   Bentall with tissue valve   CHOLECYSTECTOMY     COLONOSCOPY  09/25/2011   COLONOSCOPY W/ BIOPSIES AND POLYPECTOMY  11/04/2008   colon polyps,  diverticulosis    CORONARY ARTERY BYPASS GRAFT  12/28/2011   Procedure: CORONARY ARTERY BYPASS GRAFTING (CABG);  Surgeon: Rexene Alberts, MD;  Location: [facility];  Service: Open Heart Surgery;  Laterality: N/A;  cabg x 1   LEFT AND RIGHT HEART CATHETERIZATION WITH CORONARY ANGIOGRAM N/A 11/22/2011   Procedure: LEFT AND RIGHT HEART CATHETERIZATION WITH CORONARY ANGIOGRAM;  Surgeon: Burnell Blanks, MD;  Location: [facility] CATH LAB;  Service: Cardiovascular;  Laterality: N/A;   MULTIPLE TOOTH EXTRACTIONS     PROSTATE BIOPSY Bilateral    TEE WITHOUT CARDIOVERSION  11/22/2011   Procedure: TRANSESOPHAGEAL ECHOCARDIOGRAM (TEE);  Surgeon: Larey Dresser, MD;  Location: [facility] ENDOSCOPY;  Service: Cardiovascular;  Laterality: N/A;   UPPER GASTROINTESTINAL ENDOSCOPY  11/04/2008   w/bx, H pylori gastritis   UPPER GASTROINTESTINAL ENDOSCOPY  09/25/2011    Family History  Problem Relation Age of Onset   Breast cancer Sister    Diabetes Sister    Colon cancer Brother 22       at least 2   Diabetes Brother    Cancer Brother        colon, prostate   Prostate cancer Brother    Diabetes Brother    Diabetes Sister    Cancer Sister        breast, colon, melanoma   Heart disease Sister    Hypertension Mother    Arthritis Mother    Kidney failure Sister    Colon cancer Sister    Breast cancer Sister    Kidney disease Sister        s/p transplant   Hypertension Sister    Diabetes Sister    Stroke Father    Hypertension Father     Social History   Socioeconomic History   Marital status: Married    Spouse name: [name]   Number of children: 0   Years of education: Not on file   Highest education level: Not on file  Occupational History    Employer: [employer]    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  He  lives a very sedentary lifestyle.  He is a nonsmoker.  He denies alcohol consumption.    Social Determinants of Health   Financial Resource Strain: Low Risk  (03/21/2022)   Overall Financial Resource Strain (CARDIA)    Difficulty of Paying Living Expenses: Not hard at all  Food Insecurity: No Food Insecurity (08/02/2021)   Hunger Vital Sign    Worried About Running Out of Food in the Last Year: Never true    Ran Out of Food in the Last Year: Never true  Transportation Needs: No Transportation Needs (03/21/2022)   PRAPARE - Hydrologist (Medical): No    Lack of Transportation (Non-Medical): No  Physical Activity: Insufficiently Active (03/21/2022)   Exercise Vital Sign    Days of Exercise per Week: 3 days    Minutes of Exercise per Session: 40 min  Stress: No Stress Concern Present (08/02/2021)   [place]    Feeling of Stress : Not at all  Social Connections: [place] (08/02/2021)   Social Connection and Isolation Panel [NHANES]    Frequency of Communication with Friends and Family: More than three times a week    Frequency of Social Gatherings with Friends and Family: Three times a week    Attends Religious Services: More than 4 times per year    Active Member of Clubs or Organizations: Yes    Attends Archivist Meetings: More than 4 times per year    Marital Status: Married  Human resources officer Violence: Not At Risk (08/02/2021)   Humiliation, Afraid, Rape, and Kick questionnaire    Fear of Current or Ex-Partner: No    Emotionally Abused: No    Physically Abused: No    Sexually Abused: No    Outpatient Medications  Prior to Visit  Medication Sig Dispense Refill   amLODipine (NORVASC) 10 MG tablet TAKE 1 TABLET BY MOUTH EVERY DAY 90 tablet 1   aspirin EC 81 MG tablet Take 81 mg by mouth daily.     carvedilol (COREG) 25 MG tablet TAKE 1 TABLET BY MOUTH TWICE A DAY WITH FOOD 180 tablet 1   Cholecalciferol (VITAMIN D3) 50 MCG (2000 UT) CAPS Take 1 capsule by mouth daily.     diazepam (VALIUM) 5 MG tablet TAKE 1/2-2 TABLETS BY MOUTH DAILY AS NEEDED FOR ANXIETY. 10 tablet 0   ferrous sulfate 325 (65 FE) MG tablet TAKE 1 TABLET BY MOUTH 2 TIMES DAILY WITH A MEAL. 180 tablet 2   fluconazole (DIFLUCAN) 150 MG tablet 1 tab po daily x 3 days can repeat  course in 1 week x 3 more doses 6 tablet 0   furosemide (LASIX) 40 MG tablet Take 1 tablet (40 mg total) by mouth daily. 180 tablet 3   KLOR-CON M20 20 MEQ tablet TAKE 3 TABLETS (60 MEQ TOTAL) BY MOUTH 3 (THREE) TIMES DAILY. 810 tablet 1   latanoprost (XALATAN) 0.005 % ophthalmic solution 1 drop at bedtime.     Multiple Vitamin (MULITIVITAMIN WITH MINERALS) TABS Take 1 tablet by mouth daily.     nystatin (MYCOSTATIN) 100000 UNIT/ML suspension Take 5 mLs (500,000 Units total) by mouth 4 (four) times daily. 473 mL 1   omeprazole (PRILOSEC) 40 MG capsule TAKE 1 CAPSULE BY MOUTH EVERY DAY 90 capsule 1   Probiotic Product (PROBIOTIC DAILY PO) Take 1 tablet by mouth daily.     tamsulosin (FLOMAX) 0.4 MG CAPS capsule Take 1 capsule (0.4 mg  total) by mouth daily. 30 capsule 3   timolol (TIMOPTIC) 0.5 % ophthalmic solution INSTILL 1 DROP INTO BOTH EYES TWICE A DAY     triamcinolone ointment (KENALOG) 0.1 % SMARTSIG:1 Topical Daily     triamterene-hydrochlorothiazide (MAXZIDE-25) 37.5-25 MG tablet TAKE 1 TABLET BY MOUTH EVERY DAY 90 tablet 2   losartan (COZAAR) 100 MG tablet TAKE 1 TABLET BY MOUTH EVERY DAY 90 tablet 0   No facility-administered medications prior to visit.    Allergies  Allergen Reactions   Latex Hives    Latex gloves     Review of Systems   Constitutional:  Negative for fever and malaise/fatigue.  HENT:  Negative for congestion.   Eyes:  Negative for blurred vision.  Respiratory:  Negative for shortness of breath.   Cardiovascular:  Negative for chest pain, palpitations and leg swelling.  Gastrointestinal:  Negative for abdominal pain, blood in stool and nausea.  Genitourinary:  Negative for dysuria and frequency.  Musculoskeletal:  Negative for falls.  Skin:  Negative for rash.  Neurological:  Negative for dizziness, loss of consciousness and headaches.  Endo/Heme/Allergies:  Negative for environmental allergies.  Psychiatric/Behavioral:  Negative for depression. The patient is not nervous/anxious.        Objective:    Physical Exam Constitutional:      General: He is not in acute distress.    Appearance: Normal appearance. He is not ill-appearing or toxic-appearing.  HENT:     Head: Normocephalic and atraumatic.     Right Ear: External ear normal.     Left Ear: External ear normal.     Nose: Nose normal.  Eyes:     General:        Right eye: No discharge.        Left eye: No discharge.  Pulmonary:     Effort: Pulmonary effort is normal.  Skin:    Findings: No rash.  Neurological:     Mental Status: He is alert and oriented to person, place, and time.  Psychiatric:        Behavior: Behavior normal.     There were no vitals taken for this visit. Wt Readings from Last 3 Encounters:  05/04/22 276 lb 6.4 oz (125.4 kg)  01/20/22 295 lb (133.8 kg)  01/17/22 290 lb (131.5 kg)    Diabetic Foot Exam - Simple   No data filed    Lab Results  Component Value Date   WBC 6.7 05/04/2022   HGB 13.0 05/04/2022   HCT 38.9 (L) 05/04/2022   PLT 298.0 05/04/2022   GLUCOSE 122 (H) 05/17/2022   CHOL 166 05/04/2022   TRIG 168.0 (H) 05/04/2022   HDL 33.50 (L) 05/04/2022   LDLCALC 99 05/04/2022   ALT 10 05/17/2022   AST 13 05/17/2022   NA 137 05/17/2022   K 3.4 (L) 05/17/2022   CL 97 05/17/2022   CREATININE  1.25 05/17/2022   BUN 11 05/17/2022   CO2 30 05/17/2022   TSH 1.57 05/04/2022   PSA 23.37 (H) 05/10/2020   INR 1.21 01/02/2012   HGBA1C 5.4 05/04/2022    Lab Results  Component Value Date   TSH 1.57 05/04/2022   Lab Results  Component Value Date   WBC 6.7 05/04/2022   HGB 13.0 05/04/2022   HCT 38.9 (L) 05/04/2022   MCV 88.4 05/04/2022   PLT 298.0 05/04/2022   Lab Results  Component Value Date   NA 137 05/17/2022   K 3.4 (L) 05/17/2022   CO2  30 05/17/2022   GLUCOSE 122 (H) 05/17/2022   BUN 11 05/17/2022   CREATININE 1.25 05/17/2022   BILITOT 1.1 05/17/2022   ALKPHOS 69 05/17/2022   AST 13 05/17/2022   ALT 10 05/17/2022   PROT 6.8 05/17/2022   ALBUMIN 4.1 05/17/2022   CALCIUM 9.7 05/17/2022   GFR 54.49 (L) 05/17/2022   Lab Results  Component Value Date   CHOL 166 05/04/2022   Lab Results  Component Value Date   HDL 33.50 (L) 05/04/2022   Lab Results  Component Value Date   LDLCALC 99 05/04/2022   Lab Results  Component Value Date   TRIG 168.0 (H) 05/04/2022   Lab Results  Component Value Date   CHOLHDL 5 05/04/2022   Lab Results  Component Value Date   HGBA1C 5.4 05/04/2022       Assessment & Plan:   Problem List Items Addressed This Visit     Obesity, Class III, BMI 40-49.9 (morbid obesity) ([place])    Maintain PURE, DASH or MIND diet, decrease po intake and increase exercise as tolerated. Needs 7-8 hours of sleep nightly. Avoid trans fats, eat small, frequent meals every 4-5 hours with lean proteins, complex carbs and healthy fats. Minimize simple carbs, high fat foods and processed foods.       Essential hypertension    Monitor and report any concerns, no changes to meds. Encouraged heart healthy diet such as the DASH diet and exercise as tolerated.       Relevant Medications   losartan (COZAAR) 50 MG tablet   Other Relevant Orders   CBC w/Diff   Comprehensive metabolic panel   Hyperglycemia    hgba1c acceptable, minimize simple carbs.  Increase exercise as tolerated.       Thrush, oral    Has been treated with Nystatin and Fluconazole. Encouraged tongue blade and non alcohol mouthwash, saline gargles. He notes he is eating yogurt daily and taking probiotics and this seems to be helping.       Dyslipidemia    Encourage heart healthy diet such as MIND or DASH diet, increase exercise, avoid trans fats, simple carbohydrates and processed foods, consider a krill or fish or flaxseed oil cap daily.       Anemia    Mild, new, asymptomatic, continue to monitor       I have discontinued Elna Breslow. Wholey's losartan. I am also having him start on losartan. Additionally, I am having him maintain his multivitamin with minerals, aspirin EC, timolol, tamsulosin, vitamin D3, triamterene-hydrochlorothiazide, latanoprost, furosemide, Klor-Con M20, omeprazole, Probiotic Product (PROBIOTIC DAILY PO), carvedilol, amLODipine, ferrous sulfate, diazepam, triamcinolone ointment, nystatin, and fluconazole.  Meds ordered this encounter  Medications   losartan (COZAAR) 50 MG tablet    Sig: Take 1 tablet (50 mg total) by mouth daily.    Dispense:  90 tablet    Refill:  1    I discussed the assessment and treatment plan with the patient. The patient was provided an opportunity to ask questions and all were answered. The patient agreed with the plan and demonstrated an understanding of the instructions.   The patient was advised to call back or seek an in-person evaluation if the symptoms worsen or if the condition fails to improve as anticipated.   [name], MD [facility] [phone] (phone) [phone] (fax)  [place]

## 2022-05-29 ENCOUNTER — Other Ambulatory Visit: Payer: Medicare Other

## 2022-05-31 ENCOUNTER — Other Ambulatory Visit (INDEPENDENT_AMBULATORY_CARE_PROVIDER_SITE_OTHER): Payer: Medicare Other

## 2022-05-31 DIAGNOSIS — I10 Essential (primary) hypertension: Secondary | ICD-10-CM

## 2022-05-31 LAB — CBC WITH DIFFERENTIAL/PLATELET
Basophils Absolute: 0.1 10*3/uL (ref 0.0–0.1)
Basophils Relative: 1.2 % (ref 0.0–3.0)
Eosinophils Absolute: 0.3 10*3/uL (ref 0.0–0.7)
Eosinophils Relative: 3.2 % (ref 0.0–5.0)
HCT: 37.5 % — ABNORMAL LOW (ref 39.0–52.0)
Hemoglobin: 12.4 g/dL — ABNORMAL LOW (ref 13.0–17.0)
Lymphocytes Relative: 13.3 % (ref 12.0–46.0)
Lymphs Abs: 1 10*3/uL (ref 0.7–4.0)
MCHC: 33.1 g/dL (ref 30.0–36.0)
MCV: 89.5 fl (ref 78.0–100.0)
Monocytes Absolute: 0.7 10*3/uL (ref 0.1–1.0)
Monocytes Relative: 9.6 % (ref 3.0–12.0)
Neutro Abs: 5.7 10*3/uL (ref 1.4–7.7)
Neutrophils Relative %: 72.7 % (ref 43.0–77.0)
Platelets: 296 10*3/uL (ref 150.0–400.0)
RBC: 4.18 Mil/uL — ABNORMAL LOW (ref 4.22–5.81)
RDW: 13.3 % (ref 11.5–15.5)
WBC: 7.8 10*3/uL (ref 4.0–10.5)

## 2022-05-31 LAB — COMPREHENSIVE METABOLIC PANEL
ALT: 13 U/L (ref 0–53)
AST: 13 U/L (ref 0–37)
Albumin: 3.9 g/dL (ref 3.5–5.2)
Alkaline Phosphatase: 68 U/L (ref 39–117)
BUN: 10 mg/dL (ref 6–23)
CO2: 30 mEq/L (ref 19–32)
Calcium: 9.4 mg/dL (ref 8.4–10.5)
Chloride: 100 mEq/L (ref 96–112)
Creatinine, Ser: 1.12 mg/dL (ref 0.40–1.50)
GFR: 62.15 mL/min (ref >60.00)
Glucose, Bld: 116 mg/dL — ABNORMAL HIGH (ref 70–99)
Potassium: 3.7 mEq/L (ref 3.5–5.1)
Sodium: 139 mEq/L (ref 135–145)
Total Bilirubin: 1.2 mg/dL (ref 0.2–1.2)
Total Protein: 6.5 g/dL (ref 6.0–8.3)

## 2022-06-01 ENCOUNTER — Other Ambulatory Visit: Payer: Self-pay

## 2022-06-01 DIAGNOSIS — D509 Iron deficiency anemia, unspecified: Secondary | ICD-10-CM

## 2022-06-01 DIAGNOSIS — I10 Essential (primary) hypertension: Secondary | ICD-10-CM

## 2022-06-05 ENCOUNTER — Other Ambulatory Visit (INDEPENDENT_AMBULATORY_CARE_PROVIDER_SITE_OTHER): Payer: Medicare Other

## 2022-06-05 ENCOUNTER — Other Ambulatory Visit: Payer: Self-pay | Admitting: Family Medicine

## 2022-06-05 DIAGNOSIS — D509 Iron deficiency anemia, unspecified: Secondary | ICD-10-CM | POA: Diagnosis not present

## 2022-06-06 LAB — FECAL OCCULT BLOOD, IMMUNOCHEMICAL: Fecal Occult Bld: NEGATIVE

## 2022-06-07 ENCOUNTER — Other Ambulatory Visit: Payer: Medicare Other

## 2022-06-08 ENCOUNTER — Other Ambulatory Visit (INDEPENDENT_AMBULATORY_CARE_PROVIDER_SITE_OTHER): Payer: Medicare Other

## 2022-06-08 DIAGNOSIS — I10 Essential (primary) hypertension: Secondary | ICD-10-CM | POA: Diagnosis not present

## 2022-06-08 DIAGNOSIS — D509 Iron deficiency anemia, unspecified: Secondary | ICD-10-CM

## 2022-06-08 LAB — CBC WITH DIFFERENTIAL/PLATELET
Basophils Absolute: 0.1 10*3/uL (ref 0.0–0.1)
Basophils Relative: 1.6 % (ref 0.0–3.0)
Eosinophils Absolute: 0.3 10*3/uL (ref 0.0–0.7)
Eosinophils Relative: 3.7 % (ref 0.0–5.0)
HCT: 38.8 % — ABNORMAL LOW (ref 39.0–52.0)
Hemoglobin: 12.9 g/dL — ABNORMAL LOW (ref 13.0–17.0)
Lymphocytes Relative: 15.8 % (ref 12.0–46.0)
Lymphs Abs: 1.3 10*3/uL (ref 0.7–4.0)
MCHC: 33.2 g/dL (ref 30.0–36.0)
MCV: 88.8 fl (ref 78.0–100.0)
Monocytes Absolute: 0.8 10*3/uL (ref 0.1–1.0)
Monocytes Relative: 10.1 % (ref 3.0–12.0)
Neutro Abs: 5.6 10*3/uL (ref 1.4–7.7)
Neutrophils Relative %: 68.8 % (ref 43.0–77.0)
Platelets: 314 10*3/uL (ref 150.0–400.0)
RBC: 4.37 Mil/uL (ref 4.22–5.81)
RDW: 13.2 % (ref 11.5–15.5)
WBC: 8.1 10*3/uL (ref 4.0–10.5)

## 2022-06-09 LAB — IRON,TIBC AND FERRITIN PANEL
%SAT: 27 % (calc) (ref 20–48)
Ferritin: 1034 ng/mL — ABNORMAL HIGH (ref 24–380)
Iron: 68 ug/dL (ref 50–180)
TIBC: 250 mcg/dL (calc) (ref 250–425)

## 2022-06-14 ENCOUNTER — Ambulatory Visit (INDEPENDENT_AMBULATORY_CARE_PROVIDER_SITE_OTHER): Payer: Medicare Other

## 2022-06-14 VITALS — BP 110/64

## 2022-06-14 DIAGNOSIS — I10 Essential (primary) hypertension: Secondary | ICD-10-CM | POA: Diagnosis not present

## 2022-06-14 NOTE — Progress Notes (Signed)
Pt here for Blood pressure check per Dr. Charlett Blake  Pt currently takes:  aspirin EC 81 MG tablet    Take 81 mg by mouth daily    furosemide (LASIX) 40 MG tablet    Take 1 tablet (40 mg total) by mouth daily    carvedilol (COREG) 25 MG tablet    TAKE 1 TABLET BY MOUTH TWICE A DAY WITH FOOD    amLODipine (NORVASC) 10 MG tablet    TAKE 1 TABLET BY MOUTH EVERY DAY    losartan (COZAAR) 50 MG tablet    Take 1 tablet (50 mg total) by mouth daily    triamterene-hydrochlorothiazide (MAXZIDE-25) 37.5-25 MG tablet    TAKE 1 TABLET BY MOUTH EVERY DAY   Pt reports compliance with medication.  BP today @ =105/60 left arm and 110/64 right arm HR =  Pt advised per : Per DOD Dr. Nani Ravens to cut the Amlodipine in half taking '5mg'$  and continue the other Medication the same  Follow up  Dr. Charlett Blake next month appt made 07/06/22

## 2022-06-21 ENCOUNTER — Ambulatory Visit (INDEPENDENT_AMBULATORY_CARE_PROVIDER_SITE_OTHER): Payer: Medicare Other | Admitting: Pharmacist

## 2022-06-21 DIAGNOSIS — R739 Hyperglycemia, unspecified: Secondary | ICD-10-CM

## 2022-06-21 DIAGNOSIS — I10 Essential (primary) hypertension: Secondary | ICD-10-CM

## 2022-06-21 DIAGNOSIS — E669 Obesity, unspecified: Secondary | ICD-10-CM

## 2022-06-21 DIAGNOSIS — D509 Iron deficiency anemia, unspecified: Secondary | ICD-10-CM

## 2022-06-21 MED ORDER — FERROUS SULFATE 325 (65 FE) MG PO TABS: 325.0000 mg | ORAL_TABLET | ORAL | Status: AC

## 2022-06-21 NOTE — Progress Notes (Signed)
Pharmacy Note  06/21/2022 Name: David Parsons MRN: [id number] DOB: [birth year]  Subjective: David Parsons is a 81 y.o. year old male who is a primary care patient of [name], MD. Clinical Pharmacist Practitioner referral was placed to assist with medication management.    Engaged with patient by telephone for follow up visit today.  Hypokalemia: Last potassium was 3.7 - at goal. Patient denies muscle weakness or cramping. He is taking potassium supplement daily as prescribed.  Hypertension / CHF: Home and office blood pressures have been at goal. Home blood pressure 120 to 138 / 65 to 75 recently. His dose of amlodipine and losartan have been lowered in the last month due to low blood pressure. Patient's weight has decreased in the last 6 months and he is exercising. Good adherence to blood pressure meds per patient and refill history.  Obesity / pre DM: patient is walking 3 to 5 days per week. He has been limiting his portion sizes and has stopped drinking sodas. He has lost about 20lbs in the last 6 months.  His last  A1c was 5.4% (05/04/2022) Change in taste / thrush: Patient reports symptoms have improved since he started eating a probiotic yogurt every day.  Health maintenance: Patient due to have updated COVID. Since our last visit he completed Tdap booster and annual flu vaccine   Anxiety: prescribed diazepam to use if needed. Patient reports he takes about 4 or 5 times per year. He states he only has 1 tablet left and is requesting refill however it was sent into his pharmacy in August 2023 - looks like it was not filled.  Anemia: Last ferritin was high. [name] has recommended he lower dose of iron supplement to take every other day until completed current refill and then stop. Patient states he has a lot of ferrous sulfate on hand. It appears it is on auto refill at [facility].  GERD - taking omeprazole '40mg'$  daily. Patient reports he has not had reflux symptoms in over a year.  Weight loss might be helping with GERD.      Recent office visits:  06/14/2022 - BP check. BP was 110/64. Dose of amlodipine lowered to '5mg'$  daily .  05/25/2022 - Fam Med ([name]) Video Visit - Seen for HTN, thrush. Lowered dose of losartan to '50mg'$  daily. Potassium now normal, anemia slightly worse please have him complete an ifob. And repeat cbc with iron studies in 1 week. Increase leafy greens, consider increased lean red meat and using cast iron cookware. Continue to monitor, report any concerns   Objective: Review of patient status, including review of consultants reports, laboratory and other test data, was performed as part of comprehensive evaluation and provision of chronic care management services.   Lab Results  Component Value Date   CREATININE 1.12 05/31/2022   CREATININE 1.25 05/17/2022   CREATININE 1.59 (H) 05/04/2022    Lab Results  Component Value Date   HGBA1C 5.4 05/04/2022       Component Value Date/Time   CHOL 166 05/04/2022 1415   TRIG 168.0 (H) 05/04/2022 1415   HDL 33.50 (L) 05/04/2022 1415   CHOLHDL 5 05/04/2022 1415   VLDL 33.6 05/04/2022 1415   LDLCALC 99 05/04/2022 1415     Clinical ASCVD: No  The ASCVD Risk score (Arnett DK, et al., 2019) failed to calculate for the following reasons:   The 2019 ASCVD risk score is only valid for ages 43 to 31  BP Readings from Last 3 Encounters:  06/14/22 110/64  05/25/22 (!) 122/50  05/04/22 138/78     Allergies  Allergen Reactions   Latex Hives    Latex gloves     Medications Reviewed Today     Reviewed by [name], MD (Physician) on 05/25/22 at 1308  Med List Status: <None>   Medication Order Taking? Sig Documenting Provider Last Dose Status Informant  amLODipine (NORVASC) 10 MG tablet [id number] No TAKE 1 TABLET BY MOUTH EVERY DAY [name], MD Taking Active   aspirin EC 81 MG tablet [id number] No Take 81 mg by mouth daily. [provider] Taking Active   carvedilol  (COREG) 25 MG tablet [id number] No TAKE 1 TABLET BY MOUTH TWICE A DAY WITH FOOD [name], MD Taking Active   Cholecalciferol (VITAMIN D3) 50 MCG (2000 UT) CAPS [id number] No Take 1 capsule by mouth daily. [provider] Taking Active   diazepam (VALIUM) 5 MG tablet [id number] No TAKE 1/2-2 TABLETS BY MOUTH DAILY AS NEEDED FOR ANXIETY. Ann Held, DO Taking Active   ferrous sulfate 325 (65 FE) MG tablet [id number] No TAKE 1 TABLET BY MOUTH 2 TIMES DAILY WITH A MEAL. [name], MD Taking Active   fluconazole (DIFLUCAN) 150 MG tablet [id number]  1 tab po daily x 3 days can repeat  course in 1 week x 3 more doses [name], MD  Active   furosemide (LASIX) 40 MG tablet [id number] No Take 1 tablet (40 mg total) by mouth daily. Burnell Blanks, MD Taking Active   KLOR-CON M20 20 MEQ tablet [id number] No TAKE 3 TABLETS (60 MEQ TOTAL) BY MOUTH 3 (THREE) TIMES DAILY. [name], MD Taking Active   latanoprost (XALATAN) 0.005 % ophthalmic solution [id number] No 1 drop at bedtime. [provider] Taking Active   losartan (COZAAR) 100 MG tablet [id number] No TAKE 1 TABLET BY MOUTH EVERY DAY [name], MD Taking Active   Multiple Vitamin (MULITIVITAMIN WITH MINERALS) TABS [id number] No Take 1 tablet by mouth daily. [provider] Taking Active Spouse/Significant Other  nystatin (MYCOSTATIN) 100000 UNIT/ML suspension [id number]  Take 5 mLs (500,000 Units total) by mouth 4 (four) times daily. [name], MD  Active   omeprazole (PRILOSEC) 40 MG capsule [id number] No TAKE 1 CAPSULE BY MOUTH EVERY DAY [name], MD Taking Active   Probiotic Product (PROBIOTIC DAILY PO) [id number] No Take 1 tablet by mouth daily. [provider] Taking Active   tamsulosin (FLOMAX) 0.4 MG CAPS capsule [id number] No Take 1 capsule (0.4 mg total) by mouth daily. Freeman Caldron, PA-C Taking Active            Med Note Barbaraann Boys Sep 26, 2021 10:49  AM) Patient wanted to keep on list - using as needed. Has not taken in last month. Is not on med list from urologist.  timolol (TIMOPTIC) 0.5 % ophthalmic solution [id number] No INSTILL 1 DROP INTO BOTH EYES TWICE A DAY [provider] Taking Active   triamcinolone ointment (KENALOG) 0.1 % [id number] No SMARTSIG:1 Topical Daily [provider] Taking Active   triamterene-hydrochlorothiazide (MAXZIDE-25) 37.5-25 MG tablet [id number] No TAKE 1 TABLET BY MOUTH EVERY DAY [name], MD Taking Active             Patient Active Problem List   Diagnosis Date Noted   Anemia 05/25/2022   Malignant neoplasm of prostate ([place]) 02/03/2021  Trigger finger of right hand 09/22/2020   Anxiety attack 08/04/2019   Glaucoma 10/05/2016   Onychomycosis 03/09/2015   Hypokalemia 07/26/2014   Medicare annual wellness visit, subsequent 04/20/2014   Dyslipidemia 01/14/2013   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   Thrush, oral 02/20/2012   Follow-up examination, following unspecified surgery 01/22/2012   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   Dystrophic nail 10/23/2011   Iron deficiency anemia 06/29/2011   Personal history of colonic polyps 06/29/2011   Family history of malignant neoplasm of gastrointestinal tract 06/29/2011   CAD, NATIVE VESSEL 01/18/2010   Obesity, Class III, BMI 40-49.9 (morbid obesity) ([place]) 11/29/2009   Essential hypertension 04/05/2009   Hyperglycemia 04/05/2009   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  Patient affirms current coverage meets needs.   Assessment / Plan: Medication Management:  Answered patient's questions about recent medication changes.  Updated medication list and mailed to patient at his request.  [facility] d[facility]am that was sent to pharmacy in August 2023. Also requested ferrous sulfate be removed from auto refill.  Hypokalemia: WNL Continue current dose of potassium Hypertension / CHF: Controlled Continue lower dose of amlodipine '10mg'$  - take 0.5 tablet daily and lower dose of losartan '50mg'$  daily. Patient to see PCP 07/06/2022 to recheck blood pressure.   Obesity / pre DM: Weight has decreased about 20 lbs in the last 6 months.  Commended patient on current lifestyle changes - continue to limit high calorie foods, increase in vegetables and to exercise at least 150 minutes per week.  Change in taste / thrush: Improved Continue to eat a probiotic yogurt every day.  Health maintenance:  Reminded to get updated COVID vaccine Anxiety: controlled with as needed diazempa (uses about 4 to 5 times per year)  [facility] from 03/2022 for diazepam Anemia:  Recommended per Dr Frederik Pear last lab note that aptient lower dose of ferrous sulfate to take '325mg'$  every OTHER day.  [facility] f[facility] sulfate from auto refill.  Mailed patient copy of last lab results.  Recheck iron studies in 3 months GERD - no reflux symptoms in over a year. Weight loss might be helping with GERD.  Continue omeprazole '40mg'$  daily - might be able to lower dose to either every other day or '20mg'$  daily.     Follow Up:  Telephone follow up appointment with care management team member scheduled for:  3 months   [name], PharmD Clinical Pharmacist [place] [place] [phone]

## 2022-06-21 NOTE — Patient Instructions (Addendum)
David Parsons,  It was a pleasure speaking with you today.  Below is a summary of your health goals and summary of our recent visit.    Hypertension / high blood pressure -  Controlled Blood pressure goal less than 140/90 Continue lower dose of amlodipine '10mg'$  - take 0.5 tablet daily and lower dose of losartan '50mg'$  daily.  Also continue carvedilol '25mg'$  twice a day  Weight Management / pre DM: Weight has decreased about 20 lbs in the last 6 months.  Great job! Continue to limit high calorie foods, increase vegetables and to exercise at least 150 minutes per week.   Change in taste / thrush: Improved Continue to eat a probiotic yogurt every day.   Health maintenance:  Get updated COVID vaccine - you can get at a local pharmacy or at the [place]: controlled  [facility] - you can pick this up at your convenience  Anemia:   lower dose of ferrous sulfate to take '325mg'$  every OTHER day.  [facility] f[facility] from auto refill.  Mailed patient copy of last lab results.  Recheck iron studies in 3 months  Acid Reflux - no reflux symptoms in over a year. Weight loss might be helping with acid reflux Continue omeprazole '40mg'$  daily - might be able to lower dose to either every other day or '20mg'$  daily in the future (discuss with [name] at next visit)   As always if you have any questions or concerns especially regarding medications, please feel free to contact me either at the phone number below or with a MyChart message.   Keep up the good work!  [name], PharmD Clinical Pharmacist [facility] [place] [phone] (direct line)  [phone] (main office number)   The patient verbalized understanding of instructions, educational materials, and care plan provided today and agreed to receive a mailed copy of patient instructions, educational materials, and care plan.

## 2022-07-05 NOTE — Assessment & Plan Note (Signed)
Increase leafy greens, consider increased lean red meat and using cast iron cookware. Continue to monitor, report any concerns 

## 2022-07-05 NOTE — Assessment & Plan Note (Signed)
Well controlled, no changes to meds. Encouraged heart healthy diet such as the DASH diet and exercise as tolerated.  °

## 2022-07-05 NOTE — Assessment & Plan Note (Signed)
Encouraged DASH or MIND diet, decrease po intake and increase exercise as tolerated. Needs 7-8 hours of sleep nightly. Avoid trans fats, eat small, frequent meals every 4-5 hours with lean proteins, complex carbs and healthy fats. Minimize simple carbs, high fat foods and processed foods 

## 2022-07-05 NOTE — Assessment & Plan Note (Addendum)
hgba1c acceptable, minimize simple carbs. Increase exercise as tolerated.  RSV (respiratory syncitial virus) vaccine at pharmacy, arexvy Covid booster at pharmacy

## 2022-07-05 NOTE — Assessment & Plan Note (Signed)
Encourage heart healthy diet such as MIND or DASH diet, increase exercise, avoid trans fats, simple carbohydrates and processed foods, consider a krill or fish or flaxseed oil cap daily.  °

## 2022-07-06 ENCOUNTER — Ambulatory Visit (INDEPENDENT_AMBULATORY_CARE_PROVIDER_SITE_OTHER): Payer: Medicare Other | Admitting: Family Medicine

## 2022-07-06 ENCOUNTER — Encounter: Payer: Self-pay | Admitting: Family Medicine

## 2022-07-06 VITALS — BP 118/72 | HR 70 | Temp 98.1°F | Resp 18 | Ht 72.0 in | Wt 270.2 lb

## 2022-07-06 DIAGNOSIS — E785 Hyperlipidemia, unspecified: Secondary | ICD-10-CM

## 2022-07-06 DIAGNOSIS — I10 Essential (primary) hypertension: Secondary | ICD-10-CM | POA: Diagnosis not present

## 2022-07-06 DIAGNOSIS — B370 Candidal stomatitis: Secondary | ICD-10-CM | POA: Diagnosis not present

## 2022-07-06 DIAGNOSIS — I2510 Atherosclerotic heart disease of native coronary artery without angina pectoris: Secondary | ICD-10-CM | POA: Diagnosis not present

## 2022-07-06 DIAGNOSIS — R739 Hyperglycemia, unspecified: Secondary | ICD-10-CM

## 2022-07-06 DIAGNOSIS — D649 Anemia, unspecified: Secondary | ICD-10-CM | POA: Diagnosis not present

## 2022-07-06 LAB — CBC WITH DIFFERENTIAL/PLATELET
Basophils Absolute: 0.2 10*3/uL — ABNORMAL HIGH (ref 0.0–0.1)
Basophils Relative: 2.3 % (ref 0.0–3.0)
Eosinophils Absolute: 0.2 10*3/uL (ref 0.0–0.7)
Eosinophils Relative: 3 % (ref 0.0–5.0)
HCT: 38.6 % — ABNORMAL LOW (ref 39.0–52.0)
Hemoglobin: 12.9 g/dL — ABNORMAL LOW (ref 13.0–17.0)
Lymphocytes Relative: 11.6 % — ABNORMAL LOW (ref 12.0–46.0)
Lymphs Abs: 0.8 10*3/uL (ref 0.7–4.0)
MCHC: 33.3 g/dL (ref 30.0–36.0)
MCV: 89.1 fl (ref 78.0–100.0)
Monocytes Absolute: 0.7 10*3/uL (ref 0.1–1.0)
Monocytes Relative: 9.5 % (ref 3.0–12.0)
Neutro Abs: 5.4 10*3/uL (ref 1.4–7.7)
Neutrophils Relative %: 73.6 % (ref 43.0–77.0)
Platelets: 316 10*3/uL (ref 150.0–400.0)
RBC: 4.34 Mil/uL (ref 4.22–5.81)
RDW: 13.4 % (ref 11.5–15.5)
WBC: 7.3 10*3/uL (ref 4.0–10.5)

## 2022-07-06 NOTE — Patient Instructions (Signed)
COVID in next 2 weeks then 2 weeks after that Arexy, RSV, Respiratory Syncitial Virus vaccinationHypertension, Adult High blood pressure (hypertension) is when the force of blood pumping through the arteries is too strong. The arteries are the blood vessels that carry blood from the heart throughout the body. Hypertension forces the heart to work harder to pump blood and may cause arteries to become narrow or stiff. Untreated or uncontrolled hypertension can lead to a heart attack, heart failure, a stroke, kidney disease, and other problems. A blood pressure reading consists of a higher number over a lower number. Ideally, your blood pressure should be below 120/80. The first ("top") number is called the systolic pressure. It is a measure of the pressure in your arteries as your heart beats. The second ("bottom") number is called the diastolic pressure. It is a measure of the pressure in your arteries as the heart relaxes. What are the causes? The exact cause of this condition is not known. There are some conditions that result in high blood pressure. What increases the risk? Certain factors may make you more likely to develop high blood pressure. Some of these risk factors are under your control, including: Smoking. Not getting enough exercise or physical activity. Being overweight. Having too much fat, sugar, calories, or salt (sodium) in your diet. Drinking too much alcohol. Other risk factors include: Having a personal history of heart disease, diabetes, high cholesterol, or kidney disease. Stress. Having a family history of high blood pressure and high cholesterol. Having obstructive sleep apnea. Age. The risk increases with age. What are the signs or symptoms? High blood pressure may not cause symptoms. Very high blood pressure (hypertensive crisis) may cause: Headache. Fast or irregular heartbeats (palpitations). Shortness of breath. Nosebleed. Nausea and vomiting. Vision  changes. Severe chest pain, dizziness, and seizures. How is this diagnosed? This condition is diagnosed by measuring your blood pressure while you are seated, with your arm resting on a flat surface, your legs uncrossed, and your feet flat on the floor. The cuff of the blood pressure monitor will be placed directly against the skin of your upper arm at the level of your heart. Blood pressure should be measured at least twice using the same arm. Certain conditions can cause a difference in blood pressure between your right and left arms. If you have a high blood pressure reading during one visit or you have normal blood pressure with other risk factors, you may be asked to: Return on a different day to have your blood pressure checked again. Monitor your blood pressure at home for 1 week or longer. If you are diagnosed with hypertension, you may have other blood or imaging tests to help your health care provider understand your overall risk for other conditions. How is this treated? This condition is treated by making healthy lifestyle changes, such as eating healthy foods, exercising more, and reducing your alcohol intake. You may be referred for counseling on a healthy diet and physical activity. Your health care provider may prescribe medicine if lifestyle changes are not enough to get your blood pressure under control and if: Your systolic blood pressure is above 130. Your diastolic blood pressure is above 80. Your personal target blood pressure may vary depending on your medical conditions, your age, and other factors. Follow these instructions at home: Eating and drinking  Eat a diet that is high in fiber and potassium, and low in sodium, added sugar, and fat. An example of this eating plan is called the  DASH diet. DASH stands for Dietary Approaches to Stop Hypertension. To eat this way: Eat plenty of fresh fruits and vegetables. Try to fill one half of your plate at each meal with fruits and  vegetables. Eat whole grains, such as whole-wheat pasta, brown rice, or whole-grain bread. Fill about one fourth of your plate with whole grains. Eat or drink low-fat dairy products, such as skim milk or low-fat yogurt. Avoid fatty cuts of meat, processed or cured meats, and poultry with skin. Fill about one fourth of your plate with lean proteins, such as fish, chicken without skin, beans, eggs, or tofu. Avoid pre-made and processed foods. These tend to be higher in sodium, added sugar, and fat. Reduce your daily sodium intake. Many people with hypertension should eat less than 1,500 mg of sodium a day. Do not drink alcohol if: Your health care provider tells you not to drink. You are pregnant, may be pregnant, or are planning to become pregnant. If you drink alcohol: Limit how much you have to: 0-1 drink a day for women. 0-2 drinks a day for men. Know how much alcohol is in your drink. In the [place] one drink equals one 12 oz bottle of beer (355 mL), one 5 oz glass of wine (148 mL), or one 1 oz glass of hard liquor (44 mL). Lifestyle  Work with your health care provider to maintain a healthy body weight or to lose weight. Ask what an ideal weight is for you. Get at least 30 minutes of exercise that causes your heart to beat faster (aerobic exercise) most days of the week. Activities may include walking, swimming, or biking. Include exercise to strengthen your muscles (resistance exercise), such as Pilates or lifting weights, as part of your weekly exercise routine. Try to do these types of exercises for 30 minutes at least 3 days a week. Do not use any products that contain nicotine or tobacco. These products include cigarettes, chewing tobacco, and vaping devices, such as e-cigarettes. If you need help quitting, ask your health care provider. Monitor your blood pressure at home as told by your health care provider. Keep all follow-up visits. This is important. Medicines Take  over-the-counter and prescription medicines only as told by your health care provider. Follow directions carefully. Blood pressure medicines must be taken as prescribed. Do not skip doses of blood pressure medicine. Doing this puts you at risk for problems and can make the medicine less effective. Ask your health care provider about side effects or reactions to medicines that you should watch for. Contact a health care provider if you: Think you are having a reaction to a medicine you are taking. Have headaches that keep coming back (recurring). Feel dizzy. Have swelling in your ankles. Have trouble with your vision. Get help right away if you: Develop a severe headache or confusion. Have unusual weakness or numbness. Feel faint. Have severe pain in your chest or abdomen. Vomit repeatedly. Have trouble breathing. These symptoms may be an emergency. Get help right away. Call 911. Do not wait to see if the symptoms will go away. Do not drive yourself to the hospital. Summary Hypertension is when the force of blood pumping through your arteries is too strong. If this condition is not controlled, it may put you at risk for serious complications. Your personal target blood pressure may vary depending on your medical conditions, your age, and other factors. For most people, a normal blood pressure is less than 120/80. Hypertension is treated with lifestyle  changes, medicines, or a combination of both. Lifestyle changes include losing weight, eating a healthy, low-sodium diet, exercising more, and limiting alcohol. This information is not intended to replace advice given to you by your health care provider. Make sure you discuss any questions you have with your health care provider. Document Revised: 06/14/2021 Document Reviewed: 06/14/2021 Elsevier Patient Education  [place].

## 2022-07-06 NOTE — Assessment & Plan Note (Signed)
Patient feeling better, no new concerns.

## 2022-07-06 NOTE — Progress Notes (Signed)
Subjective:   By signing my name below, I, [name], attest that this documentation has been prepared under the direction and in the presence of [name], MD., 07/06/2022.     Patient ID: David Parsons, male    DOB: May 21, 1941, 81 y.o.   MRN: [id number]  Chief Complaint  Patient presents with   Follow-up   HPI Patient is in today for an office visit.  Patient denies having any fever, chills, congestion, sinus pain, sore throat, chest pain, palpitations, wheezing, nausea, vomitting, abdominal pain, diarrhea, constipation, blood in stool, dysuria, urgency, frequency and hematuria.  Diet/Weight: He has been attempting to maintain a balanced diet to control his weight. Body mass index is 36.65 kg/m. Wt Readings from Last 3 Encounters:  07/06/22 270 lb 3.2 oz (122.6 kg)  05/04/22 276 lb 6.4 oz (125.4 kg)  01/20/22 295 lb (133.8 kg)   Hypertension: Patient's blood pressure is within normal range today. BP Readings from Last 3 Encounters:  07/06/22 118/72  06/14/22 110/64  05/25/22 (!) 122/50   Pulse Readings from Last 3 Encounters:  07/06/22 70  05/04/22 69  01/20/22 69   Immunizations: Patient is interested in receiving Covid-19 and RSV immunizations. Immunization History  Administered Date(s) Administered   Fluad Quad(high Dose 65+) 04/22/2019, 05/07/2020, 05/10/2021   Influenza Split 05/15/2011, 05/22/2012   Influenza Whole 06/07/2009, 05/11/2010   Influenza, High Dose Seasonal PF 04/22/2015, 05/07/2017, 05/14/2018, 04/11/2022   Influenza,inj,Quad PF,6+ Mos 05/09/2013, 04/20/2014   Influenza-Unspecified 05/17/2016   Moderna Covid-19 Vaccine Bivalent Booster 42yr & up 07/01/2021   Moderna Sars-Covid-2 Vaccination 09/20/2019, 10/28/2019, 07/06/2020   Pneumococcal Conjugate-13 05/09/2013   Pneumococcal Polysaccharide-23 04/16/2006, 04/22/2015   Td 04/14/2008   Tdap 05/01/2022   Zoster Recombinat (Shingrix) 11/15/2021, 04/11/2022   Past Medical History:   Diagnosis Date   Aortic insufficiency     02/16/09 Chest CT:  The aortic root has a diameter measuring 4.6 cm, image 64 of the coronal series.  The ascending    thoracic aorta has a diameter of 3.8 cm, image 65 of the coronal series.  At the level of the aortic arch    the thoracic aorta has a maximum diameter of 3.2 cm.  The descending thoracic aorta has a maximum           diameter of 3.2 cm.  There is no evidence for aortic disse         Aortic root enlargement (HCC)    The aortic root has a diameter measuring 4.6 cm, image 64 of the coronal series.  The ascending  thoracic aorta has a diameter of 3.8 cm, image 65 of the coronal series.  At the level of the aortic arch   the thoracic aorta has a maximum diameter of 3.2 cm.  The descending thoracic aorta has a maximum   diameter of 3.2 cm.  There is no evidence for aortic dissection.        CAD (coronary artery disease)    Cardiomyopathy     01/2009 - Cath - nonobs. dzs.  EF 35%.   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   Biological Bentall Aortic Root Replacement using 25 mm Edwards Magna Ease pericardial tissue valve and 28 mm Vascutek Gelweave Valsalva conduit with reimplantation of left main and right coronary artery and CABG x1 using SVG to RCA   Sleep apnea    occasionally wears CPAP at night   Past Surgical History:  Procedure Laterality Date   ADRENALECTOMY     BENTALL PROCEDURE  12/28/2011   Procedure: BENTALL PROCEDURE;   Surgeon: Rexene Alberts, MD;  Location: [place];  Service: Open Heart Surgery;  Laterality: N/A;   CARDIAC CATHETERIZATION     2013   CARDIAC VALVE REPLACEMENT  12/28/2011   Bentall with tissue valve   CHOLECYSTECTOMY     COLONOSCOPY  09/25/2011   COLONOSCOPY W/ BIOPSIES AND POLYPECTOMY  11/04/2008   colon polyps, diverticulosis    CORONARY ARTERY BYPASS GRAFT  12/28/2011   Procedure: CORONARY ARTERY BYPASS GRAFTING (CABG);  Surgeon: Rexene Alberts, MD;  Location: [place];  Service: Open Heart Surgery;  Laterality: N/A;  cabg x 1   LEFT AND RIGHT HEART CATHETERIZATION WITH CORONARY ANGIOGRAM N/A 11/22/2011   Procedure: LEFT AND RIGHT HEART CATHETERIZATION WITH CORONARY ANGIOGRAM;  Surgeon: Burnell Blanks, MD;  Location: [facility] CATH LAB;  Service: Cardiovascular;  Laterality: N/A;   MULTIPLE TOOTH EXTRACTIONS     PROSTATE BIOPSY Bilateral    TEE WITHOUT CARDIOVERSION  11/22/2011   Procedure: TRANSESOPHAGEAL ECHOCARDIOGRAM (TEE);  Surgeon: Larey Dresser, MD;  Location: [facility] ENDOSCOPY;  Service: Cardiovascular;  Laterality: N/A;   UPPER GASTROINTESTINAL ENDOSCOPY  11/04/2008   w/bx, H pylori gastritis   UPPER GASTROINTESTINAL ENDOSCOPY  09/25/2011   Family History  Problem Relation Age of Onset   Breast cancer Sister    Diabetes Sister    Colon cancer Brother 38       at least 55   Diabetes Brother    Cancer Brother        colon, prostate   Prostate cancer Brother    Diabetes Brother    Diabetes Sister    Cancer Sister        breast, colon, melanoma   Heart disease Sister    Hypertension Mother    Arthritis Mother    Kidney failure Sister    Colon cancer Sister    Breast cancer Sister    Kidney disease Sister        s/p transplant   Hypertension Sister    Diabetes Sister    Stroke Father    Hypertension Father    Social History   Socioeconomic History   Marital status: Married    Spouse name: [name]   Number of children: 0   Years of education: Not on file    Highest education level: Not on file  Occupational History    Employer: [employer]    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  He  lives a very sedentary lifestyle.  He is a nonsmoker.  He denies alcohol consumption.    Social Determinants of Health   Financial Resource Strain: Low Risk  (03/21/2022)   Overall Financial Resource Strain (CARDIA)    Difficulty of Paying Living Expenses: Not hard at all  Food Insecurity: No Food Insecurity (08/02/2021)   Hunger Vital Sign    Worried About Running Out of Food in the Last Year: Never true    Ran Out of Food in the Last Year: Never true  Transportation Needs: No Transportation Needs (03/21/2022)   PRAPARE - Hydrologist (Medical): No    Lack of Transportation (Non-Medical): No  Physical Activity: Insufficiently Active (03/21/2022)   Exercise Vital Sign    Days of Exercise per Week: 3 days    Minutes of Exercise per Session: 40 min  Stress: No Stress Concern Present (08/02/2021)   [place]    Feeling of Stress : Not at all  Social Connections: [place] (08/02/2021)   Social Connection and Isolation Panel [NHANES]    Frequency of Communication with Friends and Family: More than three times a week    Frequency of Social Gatherings with Friends and Family: Three times a week    Attends Religious Services: More than 4 times per year    Active Member of Clubs or Organizations: Yes    Attends Theatre manager Meetings: More than 4 times per year    Marital Status: Married  Human resources officer Violence: Not At Risk (08/02/2021)   Humiliation, Afraid, Rape, and Kick questionnaire    Fear of Current or Ex-Partner: No    Emotionally Abused: No    Physically Abused: No    Sexually Abused: No   Outpatient Medications Prior to Visit  Medication Sig Dispense Refill   amLODipine (NORVASC) 10 MG tablet Take 0.5 tablets (5 mg total) by mouth daily. 45 tablet 0   aspirin EC 81 MG tablet Take 81 mg by mouth daily.     carvedilol (COREG) 25 MG tablet TAKE 1 TABLET BY MOUTH TWICE A DAY WITH FOOD 180 tablet 1   Cholecalciferol (VITAMIN D3) 50 MCG (2000 UT) CAPS Take 1 capsule by mouth daily.     diazepam (VALIUM) 5 MG tablet TAKE 1/2-2 TABLETS BY MOUTH DAILY AS NEEDED FOR ANXIETY. 10 tablet 0   ferrous sulfate 325 (65 FE) MG tablet Take 1 tablet (325 mg total) by mouth every other day.     furosemide (LASIX) 40 MG tablet Take 1 tablet (40 mg total) by mouth daily. 180 tablet 3   KLOR-CON M20 20 MEQ tablet TAKE 3 TABLETS (60 MEQ TOTAL) BY MOUTH 3 (THREE) TIMES DAILY. 810 tablet 1   latanoprost (XALATAN) 0.005 % ophthalmic solution 1 drop at bedtime.     losartan (COZAAR) 50 MG tablet Take 1 tablet (50 mg total) by mouth daily. 90 tablet 1   Multiple Vitamin (MULITIVITAMIN WITH MINERALS) TABS Take 1 tablet by mouth daily.     omeprazole (PRILOSEC) 40 MG capsule TAKE 1 CAPSULE BY MOUTH EVERY DAY 90 capsule 1   Probiotic Product (PROBIOTIC DAILY PO) Take 1 tablet by mouth daily.     tamsulosin (FLOMAX) 0.4 MG CAPS capsule Take 1 capsule (0.4 mg total) by mouth daily. 30 capsule  3   timolol (TIMOPTIC) 0.5 % ophthalmic solution INSTILL 1 DROP INTO BOTH EYES TWICE A DAY     triamcinolone ointment (KENALOG) 0.1 % SMARTSIG:1 Topical Daily     triamterene-hydrochlorothiazide (MAXZIDE-25) 37.5-25 MG tablet TAKE 1 TABLET BY MOUTH EVERY DAY 90 tablet 2   No facility-administered medications prior to visit.    Allergies  Allergen Reactions   Latex Hives    Latex gloves    Review of Systems  Constitutional:  Negative for chills and fever.  HENT:  Negative for congestion, sinus pain and sore throat.   Respiratory:  Negative for cough, shortness of breath and wheezing.   Cardiovascular:  Negative for chest pain and palpitations.  Gastrointestinal:  Negative for abdominal pain, blood in stool, constipation, diarrhea, nausea and vomiting.  Genitourinary:  Negative for dysuria, frequency, hematuria and urgency.  Skin:        (-) New moles.  Neurological:  Negative for headaches.      Objective:    Physical Exam Constitutional:      General: He is not in acute distress.    Appearance: Normal appearance. He is obese. He is not ill-appearing.  HENT:     Head: Normocephalic and atraumatic.     Right Ear: External ear normal.     Left Ear: External ear normal.     Nose: Nose normal.     Mouth/Throat:     Mouth: Mucous membranes are moist.     Pharynx: Oropharynx is clear.  Eyes:     General:        Right eye: No discharge.        Left eye: No discharge.     Extraocular Movements: Extraocular movements intact.     Pupils: Pupils are equal, round, and reactive to light.  Cardiovascular:     Rate and Rhythm: Normal rate and regular rhythm.     Pulses: Normal pulses.     Heart sounds: Normal heart sounds. No murmur heard.    No gallop.  Pulmonary:     Effort: Pulmonary effort is normal. No respiratory distress.     Breath sounds: Normal breath sounds. No wheezing or rales.  Abdominal:     General: Bowel sounds are normal.  Musculoskeletal:        General: Normal range of motion.     Cervical back: Normal range of motion.     Right lower leg: No edema.     Left lower leg: No edema.  Skin:    General: Skin is warm and dry.  Neurological:     Mental Status: He is alert and oriented to person, place, and time.  Psychiatric:        Mood and Affect: Mood normal.        Behavior:  Behavior normal.        Judgment: Judgment normal.    BP 118/72 (BP Location: Left Arm, Patient Position: Sitting, Cuff Size: Large)   Pulse 70   Temp 98.1 F (36.7 C) (Oral)   Resp 18   Ht 6' (1.829 m)   Wt 270 lb 3.2 oz (122.6 kg)   SpO2 99%   BMI 36.65 kg/m  Wt Readings from Last 3 Encounters:  07/06/22 270 lb 3.2 oz (122.6 kg)  05/04/22 276 lb 6.4 oz (125.4 kg)  01/20/22 295 lb (133.8 kg)   Diabetic Foot Exam - Simple   No data filed    Lab Results  Component Value Date   WBC 7.3 07/06/2022  HGB 12.9 (L) 07/06/2022   HCT 38.6 (L) 07/06/2022   PLT 316.0 07/06/2022   GLUCOSE 116 (H) 05/31/2022   CHOL 166 05/04/2022   TRIG 168.0 (H) 05/04/2022   HDL 33.50 (L) 05/04/2022   LDLCALC 99 05/04/2022   ALT 13 05/31/2022   AST 13 05/31/2022   NA 139 05/31/2022   K 3.7 05/31/2022   CL 100 05/31/2022   CREATININE 1.12 05/31/2022   BUN 10 05/31/2022   CO2 30 05/31/2022   TSH 1.57 05/04/2022   PSA 23.37 (H) 05/10/2020   INR 1.21 01/02/2012   HGBA1C 5.4 05/04/2022   Lab Results  Component Value Date   TSH 1.57 05/04/2022   Lab Results  Component Value Date   WBC 7.3 07/06/2022   HGB 12.9 (L) 07/06/2022   HCT 38.6 (L) 07/06/2022   MCV 89.1 07/06/2022   PLT 316.0 07/06/2022   Lab Results  Component Value Date   NA 139 05/31/2022   K 3.7 05/31/2022   CO2 30 05/31/2022   GLUCOSE 116 (H) 05/31/2022   BUN 10 05/31/2022   CREATININE 1.12 05/31/2022   BILITOT 1.2 05/31/2022   ALKPHOS 68 05/31/2022   AST 13 05/31/2022   ALT 13 05/31/2022   PROT 6.5 05/31/2022   ALBUMIN 3.9 05/31/2022   CALCIUM 9.4 05/31/2022   GFR 62.15 05/31/2022   Lab Results  Component Value Date   CHOL 166 05/04/2022   Lab Results  Component Value Date   HDL 33.50 (L) 05/04/2022   Lab Results  Component Value Date   LDLCALC 99 05/04/2022   Lab Results  Component Value Date   TRIG 168.0 (H) 05/04/2022   Lab Results  Component Value Date   CHOLHDL 5 05/04/2022   Lab  Results  Component Value Date   HGBA1C 5.4 05/04/2022      Assessment & Plan:   Blood Work: Patient will complete a CBC today to evaluate the status of his anemia.  Immunizations: Patient has been advised to receive a Covid-19 booster and RSV immunization.  Problem List Items Addressed This Visit     Obesity, Class III, BMI 40-49.9 (morbid obesity) ([place])    Encouraged DASH or MIND diet, decrease po intake and increase exercise as tolerated. Needs 7-8 hours of sleep nightly. Avoid trans fats, eat small, frequent meals every 4-5 hours with lean proteins, complex carbs and healthy fats. Minimize simple carbs, high fat foods and processed foods       Essential hypertension    Well controlled, no changes to meds. Encouraged heart healthy diet such as the DASH diet and exercise as tolerated.        Hyperglycemia    hgba1c acceptable, minimize simple carbs. Increase exercise as tolerated.  RSV (respiratory syncitial virus) vaccine at pharmacy, arexvy Covid booster at [place], oral    Patient feeling better, no new concerns.       Dyslipidemia    Encourage heart healthy diet such as MIND or DASH diet, increase exercise, avoid trans fats, simple carbohydrates and processed foods, consider a krill or fish or flaxseed oil cap daily.        Anemia - Primary    Increase leafy greens, consider increased lean red meat and using cast iron cookware. Continue to monitor, report any concerns       Relevant Orders   CBC with Differential/Platelet (Completed)   No orders of the defined types were placed in this encounter.  I,  [name], MD, personally preformed the services described in this documentation.  All medical record entries made by the scribe were at my direction and in my presence.  I have reviewed the chart and discharge instructions (if applicable) and agree that the record reflects my personal performance and is accurate and complete. 07/06/2022  I,Mohammed  Iqbal,acting as a scribe for [name], MD.,have documented all relevant documentation on the behalf of [name], MD,as directed by  [name], MD while in the presence of [name], MD.  [name], MD

## 2022-07-07 ENCOUNTER — Ambulatory Visit (INDEPENDENT_AMBULATORY_CARE_PROVIDER_SITE_OTHER): Payer: Medicare Other | Admitting: Podiatry

## 2022-07-07 ENCOUNTER — Encounter: Payer: Self-pay | Admitting: Podiatry

## 2022-07-07 DIAGNOSIS — M79674 Pain in right toe(s): Secondary | ICD-10-CM | POA: Diagnosis not present

## 2022-07-07 DIAGNOSIS — M79675 Pain in left toe(s): Secondary | ICD-10-CM

## 2022-07-07 DIAGNOSIS — B351 Tinea unguium: Secondary | ICD-10-CM

## 2022-07-07 NOTE — Progress Notes (Unsigned)
  Subjective:  Patient ID: David Parsons, male    DOB: 1941/02/06,  MRN: [id number]  David Parsons presents to clinic today for {jgcomplaint:23593}  Chief Complaint  Patient presents with   Nail Problem    Routine foot care PCP-Stacy Charlett Blake PCP VST-07/06/2022   New problem(s): None. {jgcomplaint:23593}  PCP is [name], MD , and last visit was {Time; dates multiple:15870}.  Allergies  Allergen Reactions   Latex Hives    Latex gloves     Review of Systems: Negative except as noted in the HPI.  Objective: No changes noted in today's physical examination.  David Parsons is a pleasant 81 y.o. male {jgbodyhabitus:24098} AAO x 3.  Vascular CFT immediate b/l LE. Palpable DP/PT pulses b/l LE. Digital hair absent b/l. Skin temperature gradient WNL b/l. No pain with calf compression b/l. No edema noted b/l. No cyanosis or clubbing noted b/l LE.  Neurologic Normal speech. Oriented to person, place, and time. Protective sensation intact 5/5 intact bilaterally with 10g monofilament b/l.  Dermatologic Pedal integument with normal turgor, texture and tone b/l LE. No open wounds b/l. No interdigital macerations b/l. Toenails 1-5 b/l elongated, thickened, discolored with subungual debris. +Tenderness with dorsal palpation of nailplates.   Hyperkeratotic lesion(s) noted submet head 5 b/l.  Orthopedic: Normal muscle strength 5/5 to all lower extremity muscle groups bilaterally. Hammertoe deformity noted 2-5 b/l. Pes planus deformity noted bilateral LE. No pain, crepitus or joint limitation noted with ROM b/l LE.  Patient ambulates independently without assistive aids.   Radiographs: None Assessment/Plan: No diagnosis found.  No orders of the defined types were placed in this encounter.   {Jgplan:23602::"-Patient/POA to call should there be question/concern in the interim."}   Return in about 3 months (around 10/07/2022).  Marzetta Board, DPM

## 2022-08-15 ENCOUNTER — Other Ambulatory Visit: Payer: Self-pay | Admitting: Family Medicine

## 2022-08-29 ENCOUNTER — Other Ambulatory Visit: Payer: Self-pay | Admitting: Family Medicine

## 2022-09-19 ENCOUNTER — Encounter: Payer: Self-pay | Admitting: Family Medicine

## 2022-09-19 DIAGNOSIS — I10 Essential (primary) hypertension: Secondary | ICD-10-CM

## 2022-09-19 DIAGNOSIS — R739 Hyperglycemia, unspecified: Secondary | ICD-10-CM

## 2022-09-19 DIAGNOSIS — E785 Hyperlipidemia, unspecified: Secondary | ICD-10-CM

## 2022-09-21 ENCOUNTER — Ambulatory Visit: Payer: Medicare Other | Admitting: Family Medicine

## 2022-09-22 NOTE — Telephone Encounter (Signed)
Called pt was advised,Pt stated understand  Lab appt was ordered and made future.

## 2022-10-04 ENCOUNTER — Ambulatory Visit (INDEPENDENT_AMBULATORY_CARE_PROVIDER_SITE_OTHER): Payer: Medicare Other | Admitting: Pharmacist

## 2022-10-04 DIAGNOSIS — D509 Iron deficiency anemia, unspecified: Secondary | ICD-10-CM

## 2022-10-04 DIAGNOSIS — I10 Essential (primary) hypertension: Secondary | ICD-10-CM

## 2022-10-04 DIAGNOSIS — I2510 Atherosclerotic heart disease of native coronary artery without angina pectoris: Secondary | ICD-10-CM

## 2022-10-04 NOTE — Progress Notes (Signed)
Pharmacy Note  10/04/2022 Name: David Parsons MRN: [id number] DOB: [birth year]  Subjective: David Parsons is a 82 y.o. year old male who is a primary care patient of David Lukes, MD. Clinical Pharmacist Practitioner referral was placed to assist with medication management.    Engaged with patient by telephone for follow up visit today.  Hypokalemia: Last potassium was 3.7 - at goal. Patient denies muscle weakness or cramping. He is taking potassium supplement daily as prescribed - 60 mEq 3 times a day. Due to have labs rechecked 10/18/2022.  Patient has questions about fluid pills lowering potassium and wondered why he was taking 2 fluid pills.  Hypertension / CHF:  Current therapy - losartan 73m daily, amlodipine 112mdaily, carvedilol 2571mwice a day, furosemide 3m26mily, triamterene-hydrochlorothiazide daily. Checks blood pressure at home 2 times per week - recent readings 139/48 and 108/63.  Denies dizziness. No edema today but states he does have a little swelling from time to teim.  He checks weight daily and reports weight has been decreasing due to diet changes but does notice weight increase if he misses daily furosemide dose.  Denies shortness of breath.   Obesity / pre DM:  Current therapy - none Patient continues to walk regularly - 3 to 5 days per week. Diet - limiting his portion sizes and has stopped drinking sodas. He has lost about 30lbs in the last 8 months.  His last  A1c was 5.4% (05/04/2022)  Wt Readings from Last 3 Encounters:  07/06/22 270 lb 3.2 oz (122.6 kg)  05/04/22 276 lb 6.4 oz (125.4 kg)  01/20/22 295 lb (133.8 kg)   CAD / hyperlipidemia: not currently taking statin. Last LDL was 99. Due to recheck labs 10/18/2022  Anemia: Last ferritin was high. Dr BlytCharlett Blake recommended he lower dose of iron supplement to take every other day until completed current refill and then stop.   GERD - taking omeprazole 3mg80mly as needed now. He reports he has  not had reflux symptoms in over a year. Weight loss might be helping with GERD.     Objective: Review of patient status, including review of consultants reports, laboratory and other test data, was performed as part of comprehensive evaluation and provision of chronic care management services.   Lab Results  Component Value Date   CREATININE 1.12 05/31/2022   CREATININE 1.25 05/17/2022   CREATININE 1.59 (H) 05/04/2022    Lab Results  Component Value Date   HGBA1C 5.4 05/04/2022       Component Value Date/Time   CHOL 166 05/04/2022 1415   TRIG 168.0 (H) 05/04/2022 1415   HDL 33.50 (L) 05/04/2022 1415   CHOLHDL 5 05/04/2022 1415   VLDL 33.6 05/04/2022 1415   LDLCALC 99 05/04/2022 1415     Clinical ASCVD: Yes  The ASCVD Risk score (Arnett DK, et al., 2019) failed to calculate for the following reasons:   The 2019 ASCVD risk score is only valid for ages 40 to879   52P Readings from Last 3 Encounters:  07/06/22 118/72  06/14/22 110/64  05/25/22 (!) 122/50     Allergies  Allergen Reactions   Latex Hives    Latex gloves     Medications Reviewed Today     Reviewed by EckarCherre Robins-CPP (Pharmacist) on 10/04/22 at 1053  Med List Status: <None>   Medication Order Taking? Sig Documenting Provider Last Dose Status Informant  amLODipine (NORVASC) 10 MG tablet [id number]  TAKE 1 TABLET BY MOUTH EVERY DAY David Lukes, MD Taking Active   aspirin EC 81 MG tablet [id number] Yes Take 81 mg by mouth daily. [provider] Taking Active   carvedilol (COREG) 25 MG tablet [id number] Yes TAKE 1 TABLET BY MOUTH TWICE A DAY WITH FOOD David Lukes, MD Taking Active   Cholecalciferol (VITAMIN D3) 50 MCG (2000 UT) CAPS [id number] Yes Take 1 capsule by mouth daily. [provider] Taking Active   diazepam (VALIUM) 5 MG tablet [id number] Yes TAKE 1/2-2 TABLETS BY MOUTH DAILY AS NEEDED FOR ANXIETY. Ann Held, DO Taking Active   ferrous sulfate 325 (65  FE) MG tablet [id number] Yes Take 1 tablet (325 mg total) by mouth every other day. David Lukes, MD Taking Active   furosemide (LASIX) 40 MG tablet [id number] Yes Take 1 tablet (40 mg total) by mouth daily. Burnell Blanks, MD Taking Active   KLOR-CON M20 20 MEQ tablet [id number] Yes TAKE 3 TABLETS (60 MEQ TOTAL) BY MOUTH 3 (THREE) TIMES DAILY. David Lukes, MD Taking Active   latanoprost (XALATAN) 0.005 % ophthalmic solution [id number] Yes 1 drop at bedtime. [provider] Taking Active   losartan (COZAAR) 50 MG tablet [id number] Yes TAKE 1 TABLET BY MOUTH EVERY DAY David Lukes, MD Taking Active   Multiple Vitamin (MULITIVITAMIN WITH MINERALS) TABS [id number] Yes Take 1 tablet by mouth daily. [provider] Taking Active Spouse/Significant Other  omeprazole (PRILOSEC) 40 MG capsule [id number] Yes TAKE 1 CAPSULE BY MOUTH EVERY DAY  Patient taking differently: Take 40 mg by mouth daily. As needed   David Lukes, MD Taking Active   Probiotic Product (PROBIOTIC DAILY PO) [id number] No Take 1 tablet by mouth daily.  Patient not taking: Reported on 10/04/2022   [provider] Not Taking Active   tamsulosin (FLOMAX) 0.4 MG CAPS capsule [id number] No Take 1 capsule (0.4 mg total) by mouth daily.  Patient not taking: Reported on 10/04/2022   Freeman Caldron, PA-C Not Taking Active            Med Note Barbaraann Boys Sep 26, 2021 10:49 AM) Patient wanted to keep on list - using as needed. Has not taken in last month. Is not on med list from urologist.  timolol (TIMOPTIC) 0.5 % ophthalmic solution [id number] Yes INSTILL 1 DROP INTO BOTH EYES TWICE A DAY [provider] Taking Active   triamcinolone ointment (KENALOG) 0.1 % [id number]  SMARTSIG:1 Topical Daily [provider]  Active   triamterene-hydrochlorothiazide (MAXZIDE-25) 37.5-25 MG tablet [id number] Yes TAKE 1 TABLET BY MOUTH EVERY DAY David Lukes, MD Taking Active              Patient Active Problem List   Diagnosis Date Noted   Anemia 05/25/2022   Malignant neoplasm of prostate ([place]) 02/03/2021   Trigger finger of right hand 09/22/2020   Anxiety attack 08/04/2019   Glaucoma 10/05/2016   Onychomycosis 03/09/2015   Hypokalemia 07/26/2014   Medicare annual wellness visit, subsequent 04/20/2014   Dyslipidemia 01/14/2013   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   Thrush, oral 02/20/2012   Follow-up examination, following unspecified surgery 01/22/2012   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   Dystrophic nail 10/23/2011   Iron deficiency anemia 06/29/2011   Personal history of colonic polyps 06/29/2011   Family history of malignant neoplasm of gastrointestinal tract 06/29/2011   CAD, NATIVE VESSEL 01/18/2010   Obesity, Class III,  BMI 40-49.9 (morbid obesity) ([place]) 11/29/2009   Essential hypertension 04/05/2009   Hyperglycemia 04/05/2009   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  Patient affirms current coverage meets needs.   Assessment / Plan: Hypokalemia:  Continue current potassium supplement.  Recheck BMP planned 10/18/2022.  Continue to monitor edema and blood pressure.  Discussed why he is taking furosemide (For CHF / edema) and triamterene-hydrochlorothiazide (potassium sparing and hypertension). Could consider lowering dose of triamterene-HTCZ in future and monitor blood pressure and potassium.    Hypertension / CHF:  Continue current therapy - losartan 46m daily, amlodipine 166mdaily, carvedilol 2514mwice a day, furosemide 14m46mily, triamterene-hydrochlorothiazide daily. Continue to check blood pressure at home 2 times per  week Discussed limiting sodium intake in diet.   Continue to check weight daily.   Obesity / pre DM:  Continue limiting portion sizes and exercise regularly.  CAD / hyperlipidemia: LDL goal < 70 not currently taking statin.  Due to recheck labs 10/18/2022 - will start statin if OK with PCP after labs checked.   Anemia:  Continue iron supplement every other day until completed current refill and then stop.   Patient asked about refill for eye drop from Dr DigbBing Plumet is for dry eyes. I did not see eye drop for dry eyes on his list. Called Dr Digby's office. They have patient is taking over-the-counter Systane Balance twice a day for dry eyes.  Continue latanoprost drops at bedtime.  Follow Up:  Telephone follow up appointment with care management team member scheduled for:  3 months Made appt for AWV for 01/2023   TammCherre RobinsarmD Clinical Pharmacist [place] [phone]

## 2022-10-06 ENCOUNTER — Other Ambulatory Visit: Payer: Self-pay | Admitting: Family Medicine

## 2022-10-12 DIAGNOSIS — H401132 Primary open-angle glaucoma, bilateral, moderate stage: Secondary | ICD-10-CM | POA: Diagnosis not present

## 2022-10-12 DIAGNOSIS — H25813 Combined forms of age-related cataract, bilateral: Secondary | ICD-10-CM | POA: Diagnosis not present

## 2022-10-12 DIAGNOSIS — H04123 Dry eye syndrome of bilateral lacrimal glands: Secondary | ICD-10-CM | POA: Diagnosis not present

## 2022-10-18 ENCOUNTER — Other Ambulatory Visit (INDEPENDENT_AMBULATORY_CARE_PROVIDER_SITE_OTHER): Payer: Medicare Other

## 2022-10-18 DIAGNOSIS — I10 Essential (primary) hypertension: Secondary | ICD-10-CM

## 2022-10-18 DIAGNOSIS — E785 Hyperlipidemia, unspecified: Secondary | ICD-10-CM | POA: Diagnosis not present

## 2022-10-18 DIAGNOSIS — R739 Hyperglycemia, unspecified: Secondary | ICD-10-CM

## 2022-10-18 LAB — COMPREHENSIVE METABOLIC PANEL
ALT: 9 U/L (ref 0–53)
AST: 13 U/L (ref 0–37)
Albumin: 3.8 g/dL (ref 3.5–5.2)
Alkaline Phosphatase: 73 U/L (ref 39–117)
BUN: 12 mg/dL (ref 6–23)
CO2: 31 mEq/L (ref 19–32)
Calcium: 9.6 mg/dL (ref 8.4–10.5)
Chloride: 99 mEq/L (ref 96–112)
Creatinine, Ser: 1.01 mg/dL (ref 0.40–1.50)
GFR: 70.16 mL/min (ref >60.00)
Glucose, Bld: 114 mg/dL — ABNORMAL HIGH (ref 70–99)
Potassium: 3.4 mEq/L — ABNORMAL LOW (ref 3.5–5.1)
Sodium: 139 mEq/L (ref 135–145)
Total Bilirubin: 1 mg/dL (ref 0.2–1.2)
Total Protein: 6.5 g/dL (ref 6.0–8.3)

## 2022-10-18 LAB — LIPID PANEL
Cholesterol: 163 mg/dL (ref 0–200)
HDL: 35.2 mg/dL — ABNORMAL LOW (ref >39.00)
LDL Cholesterol: 90 mg/dL (ref 0–99)
NonHDL: 127.37
Total CHOL/HDL Ratio: 5
Triglycerides: 188 mg/dL — ABNORMAL HIGH (ref 0.0–149.0)
VLDL: 37.6 mg/dL (ref 0.0–40.0)

## 2022-10-18 LAB — CBC WITH DIFFERENTIAL/PLATELET
Basophils Absolute: 0.1 10*3/uL (ref 0.0–0.1)
Basophils Relative: 2.1 % (ref 0.0–3.0)
Eosinophils Absolute: 0.4 10*3/uL (ref 0.0–0.7)
Eosinophils Relative: 5.8 % — ABNORMAL HIGH (ref 0.0–5.0)
HCT: 39.1 % (ref 39.0–52.0)
Hemoglobin: 13.1 g/dL (ref 13.0–17.0)
Lymphocytes Relative: 17.1 % (ref 12.0–46.0)
Lymphs Abs: 1.1 10*3/uL (ref 0.7–4.0)
MCHC: 33.5 g/dL (ref 30.0–36.0)
MCV: 87.7 fl (ref 78.0–100.0)
Monocytes Absolute: 0.6 10*3/uL (ref 0.1–1.0)
Monocytes Relative: 9.9 % (ref 3.0–12.0)
Neutro Abs: 4.2 10*3/uL (ref 1.4–7.7)
Neutrophils Relative %: 65.1 % (ref 43.0–77.0)
Platelets: 281 10*3/uL (ref 150.0–400.0)
RBC: 4.46 Mil/uL (ref 4.22–5.81)
RDW: 13.5 % (ref 11.5–15.5)
WBC: 6.4 10*3/uL (ref 4.0–10.5)

## 2022-10-18 LAB — TSH: TSH: 2.59 u[IU]/mL (ref 0.35–5.50)

## 2022-10-18 LAB — HEMOGLOBIN A1C: Hgb A1c MFr Bld: 5.3 % (ref 4.6–6.5)

## 2022-10-25 ENCOUNTER — Encounter: Payer: Self-pay | Admitting: Podiatry

## 2022-10-27 ENCOUNTER — Ambulatory Visit: Payer: Medicare Other | Admitting: Podiatry

## 2022-11-03 ENCOUNTER — Encounter: Payer: Self-pay | Admitting: Podiatry

## 2022-11-03 ENCOUNTER — Ambulatory Visit (INDEPENDENT_AMBULATORY_CARE_PROVIDER_SITE_OTHER): Payer: Medicare Other | Admitting: Podiatry

## 2022-11-03 VITALS — BP 145/77 | HR 77

## 2022-11-03 DIAGNOSIS — M79674 Pain in right toe(s): Secondary | ICD-10-CM | POA: Diagnosis not present

## 2022-11-03 DIAGNOSIS — M79675 Pain in left toe(s): Secondary | ICD-10-CM

## 2022-11-03 DIAGNOSIS — B351 Tinea unguium: Secondary | ICD-10-CM | POA: Diagnosis not present

## 2022-11-03 NOTE — Progress Notes (Signed)
  Subjective:  Patient ID: David Parsons, male    DOB: 1941/06/03,  MRN: [id number]  David Parsons presents to clinic today for painful elongated mycotic toenails 1-5 bilaterally which are tender when wearing enclosed shoe gear. Pain is relieved with periodic professional debridement.  Chief Complaint  Patient presents with   Nail Problem    Trim,Blyth, Stacey A, MD-pcp  A1C:5.3 Not diabetic   New problem(s): None.   PCP is [name], MD.  Allergies  Allergen Reactions   Latex Hives    Latex gloves     Review of Systems: Negative except as noted in the HPI.  Objective: No changes noted in today's physical examination. Vitals:   11/03/22 0836  BP: (!) 145/77  Pulse: 77   David Parsons is a pleasant 82 y.o. male morbidly obese in NAD. AAO x 3.  Vascular CFT immediate b/l LE. Palpable DP/PT pulses b/l LE. Digital hair absent b/l. Skin temperature gradient WNL b/l. No pain with calf compression b/l. No edema noted b/l. No cyanosis or clubbing noted b/l LE.  Neurologic Normal speech. Oriented to person, place, and time. Protective sensation intact 5/5 intact bilaterally with 10g monofilament b/l.  Dermatologic Pedal integument with normal turgor, texture and tone b/l LE. No open wounds b/l. No interdigital macerations b/l. Toenails 1-5 b/l elongated, thickened, discolored with subungual debris. +Tenderness with dorsal palpation of nailplates.   Hyperkeratotic lesion(s) noted submet head 5 b/l.  Orthopedic: Normal muscle strength 5/5 to all lower extremity muscle groups bilaterally. Hammertoe deformity noted 2-5 b/l. Pes planus deformity noted bilateral LE. No pain, crepitus or joint limitation noted with ROM b/l LE.  Patient ambulates independently without assistive aids.   Radiographs: None  Assessment/Plan: 1. Pain due to onychomycosis of toenails of both feet    -Patient was evaluated and treated. All patient's and/or POA's questions/concerns answered on today's  visit. -No new findings. No new orders. -Medicare ABN on file for refusal of paring of corn(s)/callus(es)/porokeratos(es). Copy in patient chart. -Mycotic toenails 1-5 bilaterally were debrided in length and girth with sterile nail nippers and dremel without incident. -Patient/POA to call should there be question/concern in the interim.   Return in about 3 months (around 02/03/2023).  Marzetta Board, DPM

## 2022-11-07 ENCOUNTER — Ambulatory Visit (INDEPENDENT_AMBULATORY_CARE_PROVIDER_SITE_OTHER): Payer: Medicare Other | Admitting: Family Medicine

## 2022-11-07 VITALS — BP 128/74 | HR 67 | Temp 98.0°F | Resp 16 | Ht 73.0 in | Wt 267.6 lb

## 2022-11-07 DIAGNOSIS — R739 Hyperglycemia, unspecified: Secondary | ICD-10-CM

## 2022-11-07 DIAGNOSIS — B370 Candidal stomatitis: Secondary | ICD-10-CM | POA: Diagnosis not present

## 2022-11-07 DIAGNOSIS — I10 Essential (primary) hypertension: Secondary | ICD-10-CM | POA: Diagnosis not present

## 2022-11-07 DIAGNOSIS — E876 Hypokalemia: Secondary | ICD-10-CM

## 2022-11-07 DIAGNOSIS — E785 Hyperlipidemia, unspecified: Secondary | ICD-10-CM

## 2022-11-07 LAB — COMPREHENSIVE METABOLIC PANEL
ALT: 10 U/L (ref 0–53)
AST: 14 U/L (ref 0–37)
Albumin: 3.9 g/dL (ref 3.5–5.2)
Alkaline Phosphatase: 70 U/L (ref 39–117)
BUN: 11 mg/dL (ref 6–23)
CO2: 31 mEq/L (ref 19–32)
Calcium: 9.6 mg/dL (ref 8.4–10.5)
Chloride: 100 mEq/L (ref 96–112)
Creatinine, Ser: 1 mg/dL (ref 0.40–1.50)
GFR: 70.98 mL/min (ref >60.00)
Glucose, Bld: 123 mg/dL — ABNORMAL HIGH (ref 70–99)
Potassium: 3.5 mEq/L (ref 3.5–5.1)
Sodium: 142 mEq/L (ref 135–145)
Total Bilirubin: 1.3 mg/dL — ABNORMAL HIGH (ref 0.2–1.2)
Total Protein: 6.6 g/dL (ref 6.0–8.3)

## 2022-11-07 NOTE — Progress Notes (Signed)
Subjective:   By signing my name below, I, [name], attest that this documentation has been prepared under the direction and in the presence of [name], MD., 11/07/2022.   Patient ID: David Parsons, male    DOB: 1941/07/14, 82 y.o.   MRN: [id number]  Chief Complaint  Patient presents with   Follow-up    Follow up   HPI Patient is in today for an office visit.  Hypertension Patient reports that his blood pressure has been controlled and denies CP/palpitations/SOB/HA/myalgias/ fever/chills/GI or GU symptoms. BP Readings from Last 3 Encounters:  11/07/22 128/74  11/03/22 (!) 145/77  07/06/22 118/72   Pulse Readings from Last 3 Encounters:  11/07/22 67  11/03/22 77  07/06/22 70   Immunizations He is interested in receiving the RSV vaccination at his pharmacy.  Immunization History  Administered Date(s) Administered   Fluad Quad(high Dose 65+) 04/22/2019, 05/07/2020, 05/10/2021   Influenza Split 05/15/2011, 05/22/2012   Influenza Whole 06/07/2009, 05/11/2010   Influenza, High Dose Seasonal PF 04/22/2015, 05/07/2017, 05/14/2018, 04/11/2022   Influenza,inj,Quad PF,6+ Mos 05/09/2013, 04/20/2014   Influenza-Unspecified 05/17/2016   Moderna Covid-19 Vaccine Bivalent Booster 46yrs & up 07/01/2021   Moderna Sars-Covid-2 Vaccination 09/20/2019, 10/28/2019, 07/06/2020   Pneumococcal Conjugate-13 05/09/2013   Pneumococcal Polysaccharide-23 04/16/2006, 04/22/2015   Td 04/14/2008   Tdap 05/01/2022   Zoster Recombinat (Shingrix) 11/15/2021, 04/11/2022   Past Medical History:  Diagnosis Date   Aortic insufficiency     02/16/09 Chest CT:  The aortic root has a diameter measuring 4.6 cm, image 64 of the coronal series.  The ascending    thoracic aorta has a diameter of 3.8 cm, image 65 of the coronal series.  At the level of the aortic arch    the thoracic aorta has a maximum diameter of 3.2 cm.  The descending thoracic aorta has a maximum           diameter of 3.2 cm.   There is no evidence for aortic disse         Aortic root enlargement (HCC)    The aortic root has a diameter measuring 4.6 cm, image 64 of the coronal series.  The ascending  thoracic aorta has a diameter of 3.8 cm, image 65 of the coronal series.  At the level of the aortic arch   the thoracic aorta has a maximum diameter of 3.2 cm.  The descending thoracic aorta has a maximum   diameter of 3.2 cm.  There is no evidence for aortic dissection.        CAD (coronary artery disease)    Cardiomyopathy     01/2009 - Cath - nonobs. dzs.  EF 35%.   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   Biological Bentall Aortic Root Replacement using 25 mm Edwards Magna Ease pericardial tissue valve and 28 mm Vascutek Gelweave Valsalva conduit with reimplantation of left main and right  coronary artery and CABG x1 using SVG to RCA   Sleep apnea    occasionally wears CPAP at night    Past Surgical History:  Procedure Laterality Date   ADRENALECTOMY     BENTALL PROCEDURE  12/28/2011   Procedure: BENTALL PROCEDURE;  Surgeon: Rexene Alberts, MD;  Location: [facility];  Service: Open Heart Surgery;  Laterality: N/A;   CARDIAC CATHETERIZATION     2013   CARDIAC VALVE REPLACEMENT  12/28/2011   Bentall with tissue valve   CHOLECYSTECTOMY     COLONOSCOPY  09/25/2011   COLONOSCOPY W/ BIOPSIES AND POLYPECTOMY  11/04/2008   colon polyps, diverticulosis    CORONARY ARTERY BYPASS GRAFT  12/28/2011   Procedure: CORONARY  ARTERY BYPASS GRAFTING (CABG);  Surgeon: Rexene Alberts, MD;  Location: [facility];  Service: Open Heart Surgery;  Laterality: N/A;  cabg x 1   LEFT AND RIGHT HEART CATHETERIZATION WITH CORONARY ANGIOGRAM N/A 11/22/2011   Procedure: LEFT AND RIGHT HEART CATHETERIZATION WITH CORONARY ANGIOGRAM;  Surgeon: Burnell Blanks, MD;  Location: [facility];  Service: Cardiovascular;  Laterality: N/A;   MULTIPLE TOOTH EXTRACTIONS     PROSTATE BIOPSY Bilateral    TEE WITHOUT CARDIOVERSION  11/22/2011   Procedure: TRANSESOPHAGEAL ECHOCARDIOGRAM (TEE);  Surgeon: Larey Dresser, MD;  Location: [facility] ENDOSCOPY;  Service: Cardiovascular;  Laterality: N/A;   UPPER GASTROINTESTINAL ENDOSCOPY  11/04/2008   w/bx, H pylori gastritis   UPPER GASTROINTESTINAL ENDOSCOPY  09/25/2011    Family History  Problem Relation Age of Onset   Breast cancer Sister    Diabetes Sister    Colon cancer Brother 69       at least 54   Diabetes Brother    Cancer Brother        colon, prostate   Prostate cancer Brother    Diabetes Brother    Diabetes Sister    Cancer Sister        breast, colon, melanoma   Heart disease Sister    Hypertension Mother    Arthritis Mother    Kidney failure Sister    Colon cancer Sister    Breast cancer Sister    Kidney disease Sister        s/p transplant   Hypertension Sister    Diabetes Sister    Stroke Father    Hypertension Father     Social History   Socioeconomic History   Marital status: Married    Spouse name: [name]   Number of children: 0   Years of education: Not on file   Highest education level: Not on file  Occupational History    Employer: [employer]    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  He  lives a very sedentary lifestyle.  He is a nonsmoker.  He denies alcohol consumption.    Social Determinants of Health   Financial Resource Strain: Low Risk  (03/21/2022)   Overall Emergency planning/management officer Strain (  CARDIA)    Difficulty of Paying Living Expenses: Not hard at all  Food Insecurity: No Food Insecurity (08/02/2021)   Hunger Vital Sign    Worried About Running Out of Food in the Last Year: Never true    Ran Out of Food in the Last Year: Never true  Transportation Needs: No Transportation Needs (03/21/2022)   PRAPARE - Hydrologist (Medical): No    Lack of Transportation (Non-Medical): No  Physical Activity: Insufficiently Active (03/21/2022)   Exercise Vital Sign    Days of Exercise per Week: 3 days    Minutes of Exercise per Session: 40 min  Stress: No Stress Concern Present (08/02/2021)   [place]    Feeling of Stress : Not at all  Social Connections: [place] (08/02/2021)   Social Connection and Isolation Panel [NHANES]    Frequency of Communication with Friends and Family: More than three times a week    Frequency of Social Gatherings with Friends and Family: Three times a week    Attends Religious Services: More than 4 times per year    Active Member of Clubs or Organizations: Yes    Attends Archivist Meetings: More than 4 times per year    Marital Status: Married  Human resources officer Violence: Not At Risk (08/02/2021)   Humiliation, Afraid, Rape, and Kick questionnaire    Fear of Current or Ex-Partner: No    Emotionally Abused: No    Physically Abused: No    Sexually Abused: No    Outpatient Medications Prior to Visit  Medication Sig Dispense Refill   amLODipine (NORVASC) 10 MG  tablet TAKE 1 TABLET BY MOUTH EVERY DAY 90 tablet 1   aspirin EC 81 MG tablet Take 81 mg by mouth daily.     carvedilol (COREG) 25 MG tablet TAKE 1 TABLET BY MOUTH TWICE A DAY WITH FOOD 180 tablet 1   Cholecalciferol (VITAMIN D3) 50 MCG (2000 UT) CAPS Take 1 capsule by mouth daily.     diazepam (VALIUM) 5 MG tablet TAKE 1/2-2 TABLETS BY MOUTH DAILY AS NEEDED FOR ANXIETY. 10 tablet 0   ferrous sulfate 325 (65 FE) MG tablet Take 1 tablet (325 mg total) by mouth every other day.     furosemide (LASIX) 40 MG tablet Take 1 tablet (40 mg total) by mouth daily. 180 tablet 3   KLOR-CON M20 20 MEQ tablet TAKE 3 TABLETS (60 MEQ TOTAL) BY MOUTH 3 (THREE) TIMES DAILY. 810 tablet 1   latanoprost (XALATAN) 0.005 % ophthalmic solution 1 drop at bedtime.     losartan (COZAAR) 50 MG tablet TAKE 1 TABLET BY MOUTH EVERY DAY 90 tablet 1   Multiple Vitamin (MULITIVITAMIN WITH MINERALS) TABS Take 1 tablet by mouth daily.     omeprazole (PRILOSEC) 40 MG capsule TAKE 1 CAPSULE BY MOUTH EVERY DAY (Patient taking differently: Take 40 mg by mouth daily. As needed) 90 capsule 1   Probiotic Product (PROBIOTIC DAILY PO) Take 1 tablet by mouth daily.     Propylene Glycol (SYSTANE BALANCE OP) Place 1 drop into both eyes in the morning and at bedtime. For dry eyes.     tamsulosin (FLOMAX) 0.4 MG CAPS capsule Take 1 capsule (0.4 mg total) by mouth daily. 30 capsule 3   triamcinolone ointment (KENALOG) 0.1 % SMARTSIG:1 Topical Daily     triamterene-hydrochlorothiazide (MAXZIDE-25) 37.5-25 MG tablet TAKE 1 TABLET BY MOUTH EVERY DAY 90 tablet 2  No facility-administered medications prior to visit.    Allergies  Allergen Reactions   Latex Hives    Latex gloves     Review of Systems  Constitutional:  Negative for chills and fever.  Respiratory:  Negative for shortness of breath.   Cardiovascular:  Negative for chest pain and palpitations.  Gastrointestinal:  Negative for abdominal pain, blood in stool, constipation,  diarrhea, nausea and vomiting.  Genitourinary:  Negative for dysuria, frequency, hematuria and urgency.  Musculoskeletal:  Negative for myalgias.  Skin:           Neurological:  Negative for headaches.       Objective:    Physical Exam Constitutional:      General: He is not in acute distress.    Appearance: Normal appearance. He is normal weight. He is not ill-appearing.  HENT:     Head: Normocephalic and atraumatic.     Right Ear: External ear normal.     Left Ear: External ear normal.     Nose: Nose normal.     Mouth/Throat:     Mouth: Mucous membranes are moist.     Pharynx: Oropharynx is clear.  Eyes:     General:        Right eye: No discharge.        Left eye: No discharge.     Extraocular Movements: Extraocular movements intact.     Conjunctiva/sclera: Conjunctivae normal.     Pupils: Pupils are equal, round, and reactive to light.  Cardiovascular:     Rate and Rhythm: Normal rate and regular rhythm.     Pulses: Normal pulses.     Heart sounds: Normal heart sounds. No murmur heard.    No gallop.  Pulmonary:     Effort: Pulmonary effort is normal. No respiratory distress.     Breath sounds: Normal breath sounds. No wheezing or rales.  Abdominal:     General: Bowel sounds are normal.     Palpations: Abdomen is soft.     Tenderness: There is no abdominal tenderness. There is no guarding.  Musculoskeletal:        General: Normal range of motion.     Cervical back: Normal range of motion.     Right lower leg: No edema.     Left lower leg: No edema.  Skin:    General: Skin is warm and dry.  Neurological:     Mental Status: He is alert and oriented to person, place, and time.  Psychiatric:        Mood and Affect: Mood normal.        Behavior: Behavior normal.        Judgment: Judgment normal.     BP 128/74 (BP Location: Right Arm, Patient Position: Sitting, Cuff Size: Normal)   Pulse 67   Temp 98 F (36.7 C) (Oral)   Resp 16   Ht 6\' 1"  (1.854 m)   Wt  267 lb 9.6 oz (121.4 kg)   SpO2 99%   BMI 35.31 kg/m  Wt Readings from Last 3 Encounters:  11/07/22 267 lb 9.6 oz (121.4 kg)  07/06/22 270 lb 3.2 oz (122.6 kg)  05/04/22 276 lb 6.4 oz (125.4 kg)    Diabetic Foot Exam - Simple   No data filed    Lab Results  Component Value Date   WBC 6.4 10/18/2022   HGB 13.1 10/18/2022   HCT 39.1 10/18/2022   PLT 281.0 10/18/2022   GLUCOSE 114 (H) 10/18/2022  CHOL 163 10/18/2022   TRIG 188.0 (H) 10/18/2022   HDL 35.20 (L) 10/18/2022   LDLCALC 90 10/18/2022   ALT 9 10/18/2022   AST 13 10/18/2022   NA 139 10/18/2022   K 3.4 (L) 10/18/2022   CL 99 10/18/2022   CREATININE 1.01 10/18/2022   BUN 12 10/18/2022   CO2 31 10/18/2022   TSH 2.59 10/18/2022   PSA 23.37 (H) 05/10/2020   INR 1.21 01/02/2012   HGBA1C 5.3 10/18/2022    Lab Results  Component Value Date   TSH 2.59 10/18/2022   Lab Results  Component Value Date   WBC 6.4 10/18/2022   HGB 13.1 10/18/2022   HCT 39.1 10/18/2022   MCV 87.7 10/18/2022   PLT 281.0 10/18/2022   Lab Results  Component Value Date   NA 139 10/18/2022   K 3.4 (L) 10/18/2022   CO2 31 10/18/2022   GLUCOSE 114 (H) 10/18/2022   BUN 12 10/18/2022   CREATININE 1.01 10/18/2022   BILITOT 1.0 10/18/2022   ALKPHOS 73 10/18/2022   AST 13 10/18/2022   ALT 9 10/18/2022   PROT 6.5 10/18/2022   ALBUMIN 3.8 10/18/2022   CALCIUM 9.6 10/18/2022   GFR 70.16 10/18/2022   Lab Results  Component Value Date   CHOL 163 10/18/2022   Lab Results  Component Value Date   HDL 35.20 (L) 10/18/2022   Lab Results  Component Value Date   LDLCALC 90 10/18/2022   Lab Results  Component Value Date   TRIG 188.0 (H) 10/18/2022   Lab Results  Component Value Date   CHOLHDL 5 10/18/2022   Lab Results  Component Value Date   HGBA1C 5.3 10/18/2022      Assessment & Plan:  Hypertension: This is well-controlled.   Immunizations: Encouraged patient to receive annual COVID-19/Flu vaccinations and RSV  vaccination at pharmacy.  Labs: Routine blood work ordered today. Problem List Items Addressed This Visit     Dyslipidemia - Primary    Encourage heart healthy diet such as MIND or DASH diet, increase exercise, avoid trans fats, simple carbohydrates and processed foods, consider a krill or fish or flaxseed oil cap daily.        Essential hypertension    Well controlled, no changes to meds. Encouraged heart healthy diet such as the DASH diet and exercise as tolerated.        Hyperglycemia    hgba1c acceptable, minimize simple carbs. Increase exercise as tolerated.         Hypokalemia    Mild, asymptomatic, recheck today      Relevant Orders   Comp Met (CMET)   Thrush, oral    resolved      No orders of the defined types were placed in this encounter.  I, [name], MD, personally preformed the services described in this documentation.  All medical record entries made by the scribe were at my direction and in my presence.  I have reviewed the chart and discharge instructions (if applicable) and agree that the record reflects my personal performance and is accurate and complete. 11/07/2022  I,Mohammed Iqbal,acting as a scribe for [name], MD.,have documented all relevant documentation on the behalf of [name], MD,as directed by  [name], MD while in the presence of [name], MD.  [name], MD

## 2022-11-07 NOTE — Assessment & Plan Note (Signed)
resolved 

## 2022-11-07 NOTE — Assessment & Plan Note (Signed)
Well controlled, no changes to meds. Encouraged heart healthy diet such as the DASH diet and exercise as tolerated.  °

## 2022-11-07 NOTE — Assessment & Plan Note (Signed)
Mild, asymptomatic, recheck today 

## 2022-11-07 NOTE — Patient Instructions (Signed)
RSV, Respiratory Syncitial Virus Vaccine, Arexvy at pharmacy  Hypertension, Adult High blood pressure (hypertension) is when the force of blood pumping through the arteries is too strong. The arteries are the blood vessels that carry blood from the heart throughout the body. Hypertension forces the heart to work harder to pump blood and may cause arteries to become narrow or stiff. Untreated or uncontrolled hypertension can lead to a heart attack, heart failure, a stroke, kidney disease, and other problems. A blood pressure reading consists of a higher number over a lower number. Ideally, your blood pressure should be below 120/80. The first ("top") number is called the systolic pressure. It is a measure of the pressure in your arteries as your heart beats. The second ("bottom") number is called the diastolic pressure. It is a measure of the pressure in your arteries as the heart relaxes. What are the causes? The exact cause of this condition is not known. There are some conditions that result in high blood pressure. What increases the risk? Certain factors may make you more likely to develop high blood pressure. Some of these risk factors are under your control, including: Smoking. Not getting enough exercise or physical activity. Being overweight. Having too much fat, sugar, calories, or salt (sodium) in your diet. Drinking too much alcohol. Other risk factors include: Having a personal history of heart disease, diabetes, high cholesterol, or kidney disease. Stress. Having a family history of high blood pressure and high cholesterol. Having obstructive sleep apnea. Age. The risk increases with age. What are the signs or symptoms? High blood pressure may not cause symptoms. Very high blood pressure (hypertensive crisis) may cause: Headache. Fast or irregular heartbeats (palpitations). Shortness of breath. Nosebleed. Nausea and vomiting. Vision changes. Severe chest pain, dizziness, and  seizures. How is this diagnosed? This condition is diagnosed by measuring your blood pressure while you are seated, with your arm resting on a flat surface, your legs uncrossed, and your feet flat on the floor. The cuff of the blood pressure monitor will be placed directly against the skin of your upper arm at the level of your heart. Blood pressure should be measured at least twice using the same arm. Certain conditions can cause a difference in blood pressure between your right and left arms. If you have a high blood pressure reading during one visit or you have normal blood pressure with other risk factors, you may be asked to: Return on a different day to have your blood pressure checked again. Monitor your blood pressure at home for 1 week or longer. If you are diagnosed with hypertension, you may have other blood or imaging tests to help your health care provider understand your overall risk for other conditions. How is this treated? This condition is treated by making healthy lifestyle changes, such as eating healthy foods, exercising more, and reducing your alcohol intake. You may be referred for counseling on a healthy diet and physical activity. Your health care provider may prescribe medicine if lifestyle changes are not enough to get your blood pressure under control and if: Your systolic blood pressure is above 130. Your diastolic blood pressure is above 80. Your personal target blood pressure may vary depending on your medical conditions, your age, and other factors. Follow these instructions at home: Eating and drinking  Eat a diet that is high in fiber and potassium, and low in sodium, added sugar, and fat. An example of this eating plan is called the DASH diet. DASH stands for Dietary  Approaches to Stop Hypertension. To eat this way: Eat plenty of fresh fruits and vegetables. Try to fill one half of your plate at each meal with fruits and vegetables. Eat whole grains, such as  whole-wheat pasta, brown rice, or whole-grain bread. Fill about one fourth of your plate with whole grains. Eat or drink low-fat dairy products, such as skim milk or low-fat yogurt. Avoid fatty cuts of meat, processed or cured meats, and poultry with skin. Fill about one fourth of your plate with lean proteins, such as fish, chicken without skin, beans, eggs, or tofu. Avoid pre-made and processed foods. These tend to be higher in sodium, added sugar, and fat. Reduce your daily sodium intake. Many people with hypertension should eat less than 1,500 mg of sodium a day. Do not drink alcohol if: Your health care provider tells you not to drink. You are pregnant, may be pregnant, or are planning to become pregnant. If you drink alcohol: Limit how much you have to: 0-1 drink a day for women. 0-2 drinks a day for men. Know how much alcohol is in your drink. In the [place] one drink equals one 12 oz bottle of beer (355 mL), one 5 oz glass of wine (148 mL), or one 1 oz glass of hard liquor (44 mL). Lifestyle  Work with your health care provider to maintain a healthy body weight or to lose weight. Ask what an ideal weight is for you. Get at least 30 minutes of exercise that causes your heart to beat faster (aerobic exercise) most days of the week. Activities may include walking, swimming, or biking. Include exercise to strengthen your muscles (resistance exercise), such as Pilates or lifting weights, as part of your weekly exercise routine. Try to do these types of exercises for 30 minutes at least 3 days a week. Do not use any products that contain nicotine or tobacco. These products include cigarettes, chewing tobacco, and vaping devices, such as e-cigarettes. If you need help quitting, ask your health care provider. Monitor your blood pressure at home as told by your health care provider. Keep all follow-up visits. This is important. Medicines Take over-the-counter and prescription medicines only as  told by your health care provider. Follow directions carefully. Blood pressure medicines must be taken as prescribed. Do not skip doses of blood pressure medicine. Doing this puts you at risk for problems and can make the medicine less effective. Ask your health care provider about side effects or reactions to medicines that you should watch for. Contact a health care provider if you: Think you are having a reaction to a medicine you are taking. Have headaches that keep coming back (recurring). Feel dizzy. Have swelling in your ankles. Have trouble with your vision. Get help right away if you: Develop a severe headache or confusion. Have unusual weakness or numbness. Feel faint. Have severe pain in your chest or abdomen. Vomit repeatedly. Have trouble breathing. These symptoms may be an emergency. Get help right away. Call 911. Do not wait to see if the symptoms will go away. Do not drive yourself to the hospital. Summary Hypertension is when the force of blood pumping through your arteries is too strong. If this condition is not controlled, it may put you at risk for serious complications. Your personal target blood pressure may vary depending on your medical conditions, your age, and other factors. For most people, a normal blood pressure is less than 120/80. Hypertension is treated with lifestyle changes, medicines, or a combination of  both. Lifestyle changes include losing weight, eating a healthy, low-sodium diet, exercising more, and limiting alcohol. This information is not intended to replace advice given to you by your health care provider. Make sure you discuss any questions you have with your health care provider. Document Revised: 06/14/2021 Document Reviewed: 06/14/2021 Elsevier Patient Education  [place].

## 2022-11-07 NOTE — Assessment & Plan Note (Signed)
hgba1c acceptable, minimize simple carbs. Increase exercise as tolerated.  

## 2022-11-07 NOTE — Assessment & Plan Note (Signed)
Encourage heart healthy diet such as MIND or DASH diet, increase exercise, avoid trans fats, simple carbohydrates and processed foods, consider a krill or fish or flaxseed oil cap daily.  °

## 2022-12-08 DIAGNOSIS — C61 Malignant neoplasm of prostate: Secondary | ICD-10-CM | POA: Diagnosis not present

## 2022-12-12 ENCOUNTER — Other Ambulatory Visit: Payer: Self-pay | Admitting: Cardiovascular Disease

## 2022-12-12 DIAGNOSIS — I5033 Acute on chronic diastolic (congestive) heart failure: Secondary | ICD-10-CM

## 2022-12-15 DIAGNOSIS — Z8546 Personal history of malignant neoplasm of prostate: Secondary | ICD-10-CM | POA: Diagnosis not present

## 2022-12-15 DIAGNOSIS — N400 Enlarged prostate without lower urinary tract symptoms: Secondary | ICD-10-CM | POA: Diagnosis not present

## 2022-12-25 NOTE — Progress Notes (Signed)
No chief complaint on file.  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 He was referred in May 2010 for SOB, abdominal swelling and LE edema. He was diagnosed with a non-ischemic CM, moderate AI, non-obstructive CAD and a dilated aortic root. His aortic root continued to enlarge. Cardiac cath March 2013 showed enlarged root, no obstructive disease in the left system. I could not engage the RCA secondary to the abnormal takeoff with the enlarged aortic root. CTA was performed and showed dilated aortic root with patent RCA. He underwent aortic root replacement, aortic valve replacement and SVG to RCA on 12/28/11.  Echo August 2020 with LVEF=55-60%. Severe LVH.  AVR working well. He was diagnosed with prostate cancer in 2022 treated with radiation therapy. Echo April 2023 with LVEF=50-55%. LVH. AVR working well.   He is here today for follow up. The patient denies any chest pain, dyspnea, palpitations, lower extremity edema, orthopnea, PND, dizziness, near syncope or syncope.    Primary Care Physician: Bradd Canary, MD  Past Medical History:  Diagnosis Date   Aortic insufficiency     02/16/09 Chest CT:  The aortic root has a diameter measuring 4.6 cm, image 64 of the coronal series.  The ascending    thoracic aorta has a diameter of 3.8 cm, image 65 of the coronal series.  At the level of the aortic arch    the thoracic aorta has a maximum diameter of 3.2 cm.  The descending thoracic aorta has a maximum           diameter of 3.2 cm.  There is no evidence for aortic disse         Aortic root enlargement (HCC)    The aortic root has a diameter measuring 4.6 cm, image 64 of the coronal series.  The ascending  thoracic aorta  has a diameter of 3.8 cm, image 65 of the coronal series.  At the level of the aortic arch   the thoracic aorta has a maximum diameter of 3.2 cm.  The descending thoracic aorta has a maximum   diameter of 3.2 cm.  There is no evidence for aortic dissection.        CAD (coronary artery disease)    Cardiomyopathy     01/2009 - Cath - nonobs. dzs.  EF 35%.   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   Biological Bentall Aortic Root Replacement using 25 mm Edwards Magna Ease pericardial tissue valve and 28 mm Vascutek Gelweave Valsalva conduit with reimplantation of left main and right coronary artery and CABG x1 using SVG to RCA   Sleep apnea  occasionally wears CPAP at night    Past Surgical History:  Procedure Laterality Date   ADRENALECTOMY     BENTALL PROCEDURE  12/28/2011   Procedure: BENTALL PROCEDURE;  Surgeon: Purcell Nails, MD;  Location: [facility] OR;  Service: Open Heart Surgery;  Laterality: N/A;   CARDIAC CATHETERIZATION     2013   CARDIAC VALVE REPLACEMENT  12/28/2011   Bentall with tissue valve   CHOLECYSTECTOMY     COLONOSCOPY  09/25/2011   COLONOSCOPY W/ BIOPSIES AND POLYPECTOMY  11/04/2008   colon polyps, diverticulosis    CORONARY ARTERY BYPASS GRAFT  12/28/2011   Procedure: CORONARY ARTERY BYPASS GRAFTING (CABG);  Surgeon: Purcell Nails, MD;  Location: [facility] OR;  Service: Open Heart Surgery;  Laterality: N/A;  cabg x 1   LEFT AND RIGHT HEART CATHETERIZATION WITH CORONARY  ANGIOGRAM N/A 11/22/2011   Procedure: LEFT AND RIGHT HEART CATHETERIZATION WITH CORONARY ANGIOGRAM;  Surgeon: Kathleene Hazel, MD;  Location[facility] C[facility] LAB;  Service: Cardiovascular;  Laterality: N/A;   MULTIPLE TOOTH EXTRACTIONS     PROSTATE BIOPSY Bilateral    TEE WITHOUT CARDIOVERSION  11/22/2011   Procedure: TRANSESOPHAGEAL ECHOCARDIOGRAM (TEE);  Surgeon: Laurey Morale, MD;  Location: [facility] ENDOSCOPY;  Service: Cardiovascular;  Laterality: N/A;   UPPER GASTROINTESTINAL ENDOSCOPY  11/04/2008   w/bx, H pylori gastritis   UPPER GASTROINTESTINAL ENDOSCOPY  09/25/2011    Current Outpatient Medications  Medication Sig Dispense Refill   amLODipine (NORVASC) 10 MG tablet TAKE 1 TABLET BY MOUTH EVERY DAY 90 tablet 1   aspirin EC 81 MG tablet Take 81 mg by mouth daily.     carvedilol (COREG) 25 MG tablet TAKE 1 TABLET BY MOUTH TWICE A DAY WITH FOOD 180 tablet 1   Cholecalciferol (VITAMIN D3) 50 MCG (2000 UT) CAPS Take 1 capsule by mouth daily.     diazepam (VALIUM) 5 MG tablet TAKE 1/2-2 TABLETS BY MOUTH DAILY AS NEEDED FOR ANXIETY. 10 tablet 0   ferrous sulfate 325 (65 FE) MG tablet Take 1 tablet (325 mg total) by mouth every other day.     furosemide (LASIX) 40 MG tablet Take 1 tablet (40 mg total) by mouth daily. 90 tablet 1   KLOR-CON M20 20 MEQ tablet TAKE 3 TABLETS (60 MEQ TOTAL) BY MOUTH 3 (THREE) TIMES DAILY. 810 tablet 1   latanoprost (XALATAN) 0.005 % ophthalmic solution 1 drop at bedtime.     losartan (COZAAR) 50 MG tablet TAKE 1 TABLET BY MOUTH EVERY DAY 90 tablet 1   Multiple Vitamin (MULITIVITAMIN WITH MINERALS) TABS Take 1 tablet by mouth daily.     omeprazole (PRILOSEC) 40 MG capsule TAKE 1 CAPSULE BY MOUTH EVERY DAY (Patient taking differently: Take 40 mg by mouth daily. As needed) 90 capsule 1   Probiotic Product (PROBIOTIC DAILY PO) Take 1 tablet by mouth daily.     Propylene Glycol (SYSTANE BALANCE OP) Place 1 drop into both eyes in the morning and at bedtime. For dry  eyes.     tamsulosin (FLOMAX) 0.4 MG CAPS capsule Take 1 capsule (0.4 mg total) by mouth daily. 30 capsule 3   triamcinolone ointment (KENALOG) 0.1 % SMARTSIG:1 Topical Daily     triamterene-hydrochlorothiazide (MAXZIDE-25) 37.5-25 MG tablet TAKE 1 TABLET BY MOUTH EVERY DAY 90 tablet 2   No current facility-administered medications for this visit.    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  He  lives a very sedentary lifestyle.  He is a nonsmoker.  He denies alcohol consumption.    Social Determinants of Health   Financial Resource Strain: Low Risk  (03/21/2022)   Overall Financial Resource Strain (CARDIA)    Difficulty of Paying Living Expenses: Not hard at all  Food Insecurity: No Food Insecurity (08/02/2021)   Hunger Vital Sign    Worried About Running Out of Food in the Last Year: Never true    Ran Out of Food in the Last Year: Never true  Transportation Needs: No Transportation Needs (03/21/2022)   PRAPARE - Administrator, Civil Service (Medical): No    Lack of Transportation (Non-Medical): No  Physical Activity: Insufficiently Active (03/21/2022)   Exercise Vital Sign    Days of Exercise per Week: 3 days    Minutes of Exercise  per Session: 40 min  Stress: No Stress Concern Present (08/02/2021)   [employer] of Occupational Health - Occupational Stress Questionnaire    Feeling of Stress : Not at all  Social Connections: Socially Integrated (08/02/2021)   Social Connection and Isolation Panel [NHANES]    Frequency of Communication with Friends and Family: More than three times a week    Frequency of Social Gatherings with Friends and Family: Three times a week    Attends Religious Services: More than 4 times per year    Active Member of Clubs or Organizations: Yes    Attends Banker Meetings: More than 4 times per year    Marital Status: Married  Catering manager Violence: Not At Risk (08/02/2021)   Humiliation, Afraid, Rape, and Kick questionnaire    Fear of Current or Ex-Partner: No    Emotionally Abused: No    Physically Abused: No    Sexually Abused: No    Family History  Problem Relation Age of Onset   Breast cancer Sister    Diabetes Sister    Colon cancer Brother 8       at least 37   Diabetes Brother    Cancer Brother        colon, prostate   Prostate cancer Brother    Diabetes Brother    Diabetes Sister    Cancer Sister        breast, colon, melanoma   Heart disease Sister    Hypertension Mother    Arthritis Mother    Kidney failure Sister    Colon cancer Sister    Breast cancer Sister    Kidney disease Sister        s/p transplant   Hypertension Sister    Diabetes Sister    Stroke Father  Hypertension Father     Review of Systems:  As stated in the HPI and otherwise negative.   There were no vitals taken for this visit.  Physical Examination:  General: Well developed, well nourished, NAD  HEENT: OP clear, mucus membranes moist  SKIN: warm, dry. No rashes. Neuro: No focal deficits  Musculoskeletal: Muscle strength 5/5 all ext  Psychiatric: Mood and affect normal  Neck: No JVD, no carotid bruits, no thyromegaly, no lymphadenopathy.  Lungs:Clear  bilaterally, no wheezes, rhonci, crackles Cardiovascular: Regular rate and rhythm. No murmurs, gallops or rubs. Abdomen:Soft. Bowel sounds present. Non-tender.  Extremities: No lower extremity edema. Pulses are 2 + in the bilateral DP/PT.  Echo April 2023: 1. Limited study due to poor echo windows.   2. Left ventricular ejection fraction, by estimation, is 50 to 55%. The  left ventricle has low normal function. The left ventricle has no regional  wall motion abnormalities. There is moderate left ventricular hypertrophy.  Left ventricular diastolic  parameters are consistent with Grade I diastolic dysfunction (impaired  relaxation).   3. Right ventricular systolic function was not well visualized. The right  ventricular size is not well visualized.   4. The mitral valve is normal in structure. Trivial mitral valve  regurgitation.   5. The aortic valve has been repaired/replaced. There is a 25 mm [facility] Ease pericardial valve present in the aortic position. The valve  appears well seated with normal function by doppler interrogation. Aortic  valve mean gradient measures 10.0  mmHg. Aortic valve Vmax measures 2.20 m/s. DI 0.33. There is no aortic  regurgitation visualized. There is no aortic stenosis.   EKG:  EKG is *** ordered today. The ekg ordered today demonstrates  Recent Labs: 10/18/2022: Hemoglobin 13.1; Platelets 281.0; TSH 2.59 11/07/2022: ALT 10; BUN 11; Creatinine, Ser 1.00; Potassium 3.5; Sodium 142   Lipid Panel    Component Value Date/Time   CHOL 163 10/18/2022 0754   TRIG 188.0 (H) 10/18/2022 0754   HDL 35.20 (L) 10/18/2022 0754   CHOLHDL 5 10/18/2022 0754   VLDL 37.6 10/18/2022 0754   LDLCALC 90 10/18/2022 0754     Wt Readings from Last 3 Encounters:  11/07/22 121.4 kg  07/06/22 122.6 kg  05/04/22 125.4 kg    Assessment and Plan:   1. CAD s/p CABG without angina: He had a single vessel bypass to RCA at the time of his root replacement and AVR. No  significant coronary atherosclerosis noted prior to his surgery. No chest pain suggestive of angina. Will continue ASA and beta blocker. He has not been on a statin. (no CAD noted on pre surgery cath).     2. Chronic diastolic CHF:  No volume overload on exam. Weight is stable. Continue Lasix  3. HYPERTENSION: BP is well controlled. No changes  4. S/P aortic valve replacement and aortoplasty: LV function normal by echo April 2023. AVR working well with mild expected gradient across valve. He will continue using SBE prophylaxis prior to dental procedures.   Labs/ tests ordered today include:  No orders of the defined types were placed in this encounter.  Disposition:   F/U with me in 12 months  Signed, Verne Carrow, MD 12/25/2022 6:15 PM    [facility] [address] [place], [place], [place]  [address] Phone: [phone]; Fax: [phone]

## 2022-12-26 ENCOUNTER — Encounter: Payer: Self-pay | Admitting: Cardiovascular Disease

## 2022-12-26 ENCOUNTER — Ambulatory Visit: Payer: Medicare Other | Attending: Cardiovascular Disease | Admitting: Cardiovascular Disease

## 2022-12-26 VITALS — BP 122/78 | HR 70 | Ht 72.0 in | Wt 258.6 lb

## 2022-12-26 DIAGNOSIS — I5032 Chronic diastolic (congestive) heart failure: Secondary | ICD-10-CM | POA: Diagnosis not present

## 2022-12-26 DIAGNOSIS — I2510 Atherosclerotic heart disease of native coronary artery without angina pectoris: Secondary | ICD-10-CM | POA: Diagnosis not present

## 2022-12-26 DIAGNOSIS — I359 Nonrheumatic aortic valve disorder, unspecified: Secondary | ICD-10-CM | POA: Insufficient documentation

## 2022-12-26 DIAGNOSIS — I10 Essential (primary) hypertension: Secondary | ICD-10-CM | POA: Diagnosis not present

## 2022-12-26 NOTE — Patient Instructions (Signed)
Medication Instructions:  Your physician recommends that you continue on your current medications as directed. Please refer to the Current Medication list given to you today.  *If you need a refill on your cardiac medications before your next appointment, please call your pharmacy*   Lab Work: None ordered   If you have labs (blood work) drawn today and your tests are completely normal, you will receive your results only by: MyChart Message (if you have MyChart) OR A paper copy in the mail If you have any lab test that is abnormal or we need to change your treatment, we will call you to review the results.   Testing/Procedures: None ordered    Follow-Up: At [facility], you and your health needs are our priority.  As part of our continuing mission to provide you with exceptional heart care, we have created designated Provider Care Teams.  These Care Teams include your primary Cardiologist (physician) and Advanced Practice Providers (APPs -  Physician Assistants and Nurse Practitioners) who all work together to provide you with the care you need, when you need it.  We recommend signing up for the patient portal called "MyChart".  Sign up information is provided on this After Visit Summary.  MyChart is used to connect with patients for Virtual Visits (Telemedicine).  Patients are able to view lab/test results, encounter notes, upcoming appointments, etc.  Non-urgent messages can be sent to your provider as well.   To learn more about what you can do with MyChart, go to [email or web address].    Your next appointment:   12 month(s)  Provider:   Christopher McAlhany, MD     Other Instructions   

## 2023-01-07 ENCOUNTER — Other Ambulatory Visit: Payer: Self-pay | Admitting: Family Medicine

## 2023-01-22 ENCOUNTER — Other Ambulatory Visit: Payer: Self-pay | Admitting: Family Medicine

## 2023-01-22 DIAGNOSIS — F410 Panic disorder [episodic paroxysmal anxiety] without agoraphobia: Secondary | ICD-10-CM

## 2023-01-22 NOTE — Telephone Encounter (Signed)
Requesting: valium Contract:n/a UDS:n/a Last Visit:11/07/22 Next Visit:03/15/23 Last Refill:03/21/22  Please Advise

## 2023-01-24 ENCOUNTER — Ambulatory Visit (INDEPENDENT_AMBULATORY_CARE_PROVIDER_SITE_OTHER): Payer: Medicare Other | Admitting: *Deleted

## 2023-01-24 VITALS — Ht 72.0 in | Wt 257.0 lb

## 2023-01-24 DIAGNOSIS — Z0000 Encounter for general adult medical examination without abnormal findings: Secondary | ICD-10-CM

## 2023-01-24 NOTE — Progress Notes (Signed)
Subjective:   David Parsons is a 82 y.o. male who presents for Medicare Annual/Subsequent preventive examination.  I connected with  David Parsons on 01/24/23 by a audio enabled telemedicine application and verified that I am speaking with the correct person using two identifiers.  Patient Location: Home  Provider Location: Office/Clinic  I discussed the limitations of evaluation and management by telemedicine. The patient expressed understanding and agreed to proceed.   Review of Systems     Cardiac Risk Factors include: advanced age (>78men, >19 women);male gender;dyslipidemia;hypertension;obesity (BMI >30kg/m2)     Objective:    Today's Vitals   01/24/23 0904  Weight: 257 lb (116.6 kg)  Height: 6' (1.829 m)   Body mass index is 34.86 kg/m.     01/24/2023    9:00 AM 01/17/2022    8:41 AM 06/06/2021    4:10 PM 02/03/2021    1:56 PM 08/18/2019    8:10 AM 08/12/2018    9:36 AM 08/09/2017    9:30 AM  Advanced Directives  Does Patient Have a Medical Advance Directive? No No No No No Yes No  Type of Careers adviser;Living will   Copy of Healthcare Power of Attorney in Chart?      Yes - validated most recent copy scanned in chart (See row information)   Would patient like information on creating a medical advance directive? No - Patient declined  No - Patient declined  No - Patient declined  Yes (MAU/Ambulatory/Procedural Areas - Information given)    Current Medications (verified) Outpatient Encounter Medications as of 01/24/2023  Medication Sig   amLODipine (NORVASC) 10 MG tablet TAKE 1 TABLET BY MOUTH EVERY DAY   aspirin EC 81 MG tablet Take 81 mg by mouth daily.   carvedilol (COREG) 25 MG tablet TAKE 1 TABLET BY MOUTH TWICE A DAY WITH FOOD   Cholecalciferol (VITAMIN D3) 50 MCG (2000 UT) CAPS Take 1 capsule by mouth daily.   diazepam (VALIUM) 5 MG tablet TAKE 1/2-2 TABLETS BY MOUTH DAILY AS NEEDED FOR ANXIETY.   ferrous sulfate 325  (65 FE) MG tablet Take 1 tablet (325 mg total) by mouth every other day.   furosemide (LASIX) 40 MG tablet Take 1 tablet (40 mg total) by mouth daily.   KLOR-CON M20 20 MEQ tablet TAKE 3 TABLETS (60 MEQ TOTAL) BY MOUTH 3 (THREE) TIMES DAILY.   latanoprost (XALATAN) 0.005 % ophthalmic solution 1 drop at bedtime.   losartan (COZAAR) 50 MG tablet TAKE 1 TABLET BY MOUTH EVERY DAY   Multiple Vitamin (MULITIVITAMIN WITH MINERALS) TABS Take 1 tablet by mouth daily.   omeprazole (PRILOSEC) 40 MG capsule Take 1 capsule (40 mg total) by mouth daily.   Probiotic Product (PROBIOTIC DAILY PO) Take 1 tablet by mouth daily.   Propylene Glycol (SYSTANE BALANCE OP) Place 1 drop into both eyes in the morning and at bedtime. For dry eyes.   tamsulosin (FLOMAX) 0.4 MG CAPS capsule Take 1 capsule (0.4 mg total) by mouth daily.   triamcinolone ointment (KENALOG) 0.1 % SMARTSIG:1 Topical Daily   triamterene-hydrochlorothiazide (MAXZIDE-25) 37.5-25 MG tablet TAKE 1 TABLET BY MOUTH EVERY DAY   No facility-administered encounter medications on file as of 01/24/2023.    Allergies (verified) Latex   History: Past Medical History:  Diagnosis Date   Aortic insufficiency     02/16/09 Chest CT:  The aortic root has a diameter measuring 4.6 cm, image 64 of the coronal  series.  The ascending    thoracic aorta has a diameter of 3.8 cm, image 65 of the coronal series.  At the level of the aortic arch    the thoracic aorta has a maximum diameter of 3.2 cm.  The descending thoracic aorta has a maximum           diameter of 3.2 cm.  There is no evidence for aortic disse         Aortic root enlargement (HCC)    The aortic root has a diameter measuring 4.6 cm, image 64 of the coronal series.  The ascending  thoracic aorta has a diameter of 3.8 cm, image 65 of the coronal series.  At the level of the aortic arch   the thoracic aorta has a maximum diameter of 3.2 cm.  The descending thoracic aorta has a maximum   diameter of 3.2 cm.   There is no evidence for aortic dissection.        CAD (coronary artery disease)    Cardiomyopathy     01/2009 - Cath - nonobs. dzs.  EF 35%.   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   Biological Bentall Aortic Root Replacement using 25 mm Edwards Magna Ease pericardial tissue valve and 28 mm Vascutek Gelweave Valsalva conduit with reimplantation of left main and right coronary artery and CABG x1 using SVG to RCA   Sleep apnea    occasionally wears CPAP at night   Past Surgical History:  Procedure Laterality Date   ADRENALECTOMY     BENTALL PROCEDURE  12/28/2011   Procedure: BENTALL PROCEDURE;  Surgeon: Purcell Nails, MD;  Location: [facility] OR;  Service: Open Heart Surgery;  Laterality: N/A;   CARDIAC CATHETERIZATION     2013   CARDIAC VALVE REPLACEMENT  12/28/2011   Bentall with tissue valve   CHOLECYSTECTOMY     COLONOSCOPY  09/25/2011   COLONOSCOPY W/ BIOPSIES AND POLYPECTOMY  11/04/2008   colon polyps, diverticulosis    CORONARY ARTERY BYPASS GRAFT  12/28/2011   Procedure: CORONARY ARTERY BYPASS GRAFTING (CABG);  Surgeon: Purcell Nails, MD;  Location: [facility] OR;  Service: Open Heart Surgery;  Laterality: N/A;  cabg x 1   LEFT AND RIGHT HEART CATHETERIZATION WITH CORONARY ANGIOGRAM N/A 11/22/2011   Procedure: LEFT AND RIGHT HEART CATHETERIZATION WITH CORONARY ANGIOGRAM;  Surgeon: Kathleene Hazel, MD;  Location: [facility] C[facility] LAB;  Service: Cardiovascular;  Laterality: N/A;    MULTIPLE TOOTH EXTRACTIONS     PROSTATE BIOPSY Bilateral    TEE WITHOUT CARDIOVERSION  11/22/2011   Procedure: TRANSESOPHAGEAL ECHOCARDIOGRAM (TEE);  Surgeon: Laurey Morale, MD;  Location: [facility] ENDOSCOPY;  Service: Cardiovascular;  Laterality: N/A;   UPPER GASTROINTESTINAL ENDOSCOPY  11/04/2008   w/bx, H pylori gastritis   UPPER GASTROINTESTINAL ENDOSCOPY  09/25/2011   Family History  Problem Relation Age of Onset   Breast cancer Sister    Diabetes  Sister    Colon cancer Brother 43       at least 6   Diabetes Brother    Cancer Brother        colon, prostate   Prostate cancer Brother    Diabetes Brother    Diabetes Sister    Cancer Sister        breast, colon, melanoma   Heart disease Sister    Hypertension Mother    Arthritis Mother    Kidney failure Sister    Colon cancer Sister    Breast cancer Sister    Kidney disease Sister        s/p transplant   Hypertension Sister    Diabetes Sister    Stroke Father    Hypertension Father    Social History   Socioeconomic History   Marital status: Married    Spouse name: Steward Drone   Number of children: 0   Years of education: Not on file   Highest education level: Not on file  Occupational History    Employer: [employer]    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  He  lives a very sedentary lifestyle.  He is a nonsmoker.  He denies alcohol consumption.    Social Determinants of Health   Financial Resource Strain: Low Risk  (03/21/2022)   Overall Financial Resource Strain (CARDIA)     Difficulty of Paying Living Expenses: Not hard at all  Food Insecurity: No Food Insecurity (01/24/2023)   Hunger Vital Sign    Worried About Running Out of Food in the Last Year: Never true    Ran Out of Food in the Last Year: Never true  Transportation Needs: No Transportation Needs (01/24/2023)   PRAPARE - Administrator, Civil Service (Medical): No    Lack of Transportation (Non-Medical): No  Physical Activity: Insufficiently Active (03/21/2022)   Exercise Vital Sign    Days of Exercise per Week: 3 days    Minutes of Exercise per Session: 40 min  Stress: No Stress Concern Present (08/02/2021)   [employer] of Occupational Health - Occupational Stress Questionnaire    Feeling of Stress : Not at all  Social Connections: Socially Integrated (08/02/2021)   Social Connection and Isolation Panel [NHANES]    Frequency of Communication with Friends and Family: More than three times a week    Frequency of Social Gatherings with Friends and Family: Three times a week    Attends Religious Services: More than 4 times per year    Active Member of Clubs or Organizations: Yes    Attends Engineer, structural: More than 4 times per year    Marital Status: Married    Tobacco Counseling Counseling given: Not Answered   Clinical Intake:  Pre-visit preparation completed: Yes  Pain : No/denies pain  BMI - recorded: 34.86 Nutritional Status: BMI > 30  Obese Nutritional Risks: None Diabetes: No  How often do you need to  have someone help you when you read instructions, pamphlets, or other written materials from your doctor or pharmacy?: 1 - Never   Activities of Daily Living    01/24/2023    9:03 AM  In your present state of health, do you have any difficulty performing the following activities:  Hearing? 0  Vision? 0  Difficulty concentrating or making decisions? 0  Walking or climbing stairs? 0  Dressing or bathing? 0  Doing errands, shopping? 0  Preparing Food  and eating ? N  Using the Toilet? N  In the past six months, have you accidently leaked urine? N  Do you have problems with loss of bowel control? N  Managing your Medications? N  Managing your Finances? N  Housekeeping or managing your Housekeeping? N    Patient Care Team: Bradd Canary, MD as PCP - General (Family Medicine) Kathleene Hazel, MD as PCP - Cardiology (Cardiology) Kathleene Hazel, MD as Consulting Physician (Cardiology) Iva Boop, MD as Consulting Physician (Gastroenterology) Cherlyn Roberts, MD as Consulting Physician (Dermatology) Nelson Chimes, MD as Consulting Physician (Ophthalmology) Jethro Bolus, MD (Inactive) as Consulting Physician (Urology) Helane Gunther, DPM as Consulting Physician (Podiatry) Henrene Pastor, RPH-CPP (Pharmacist) Marcine Matar, MD as Consulting Physician (Urology) Axel Filler, Larna Daughters, NP as Nurse Practitioner (Hematology and Oncology) Maryclare Labrador, RN as Registered Nurse Margaretmary Dys, MD as Consulting Physician (Radiation Oncology)  Indicate any recent Medical Services you may have received from other than [facility] providers in the past year (date may be approximate).     Assessment:   This is a routine wellness examination for [employer].  Hearing/Vision screen No results found.  Dietary issues and exercise activities discussed: Current Exercise Habits: Home exercise routine, Type of exercise: walking;strength training/weights, Time (Minutes): 40, Frequency (Times/Week): 3, Weekly Exercise (Minutes/Week): 120, Intensity: Mild, Exercise limited by: None identified   Goals Addressed   None    Depression Screen    01/24/2023    9:03 AM 11/07/2022    9:03 AM 07/06/2022   11:39 AM 05/04/2022    1:25 PM 01/17/2022    8:45 AM 11/22/2021    9:15 AM 08/02/2021   12:04 PM  PHQ 2/9 Scores  PHQ - 2 Score 0 0 0 0 0 0 0  PHQ- 9 Score  0  0       Fall Risk    01/24/2023    9:02 AM 11/07/2022    9:03  AM 07/06/2022   11:38 AM 05/25/2022   11:45 AM 05/04/2022    1:25 PM  Fall Risk   Falls in the past year? 0 0 0 0 0  Number falls in past yr: 0 0 0 0 0  Injury with Fall? 0 0 0 0 0  Risk for fall due to : No Fall Risks  No Fall Risks    Follow up Falls evaluation completed Falls evaluation completed Falls evaluation completed Falls evaluation completed Falls evaluation completed    FALL RISK PREVENTION PERTAINING TO THE HOME:  Any stairs in or around the home? No  Home free of loose throw rugs in walkways, pet beds, electrical cords, etc? Yes  Adequate lighting in your home to reduce risk of falls? Yes   ASSISTIVE DEVICES UTILIZED TO PREVENT FALLS:  Life alert? No  Use of a cane, walker or w/c? No  Grab bars in the bathroom? No  Shower chair or bench in shower? No  Elevated toilet seat or a handicapped toilet? No  TIMED UP AND GO:  Was the test performed?  No, audio visit .    Cognitive Function:    08/09/2017    9:35 AM 08/01/2016   11:54 AM  MMSE - Mini Mental State Exam  Orientation to time 5 5  Orientation to Place 4 5  Registration 3 3  Attention/ Calculation 5 3  Recall 2 2  Language- name 2 objects 2 2  Language- repeat 1 1  Language- follow 3 step command 3 3  Language- read & follow direction 1 1  Write a sentence 1 1  Copy design 1 1  Total score 28 27        01/24/2023    9:10 AM 08/18/2019    8:15 AM  6CIT Screen  What Year? 0 points 0 points  What month? 0 points 0 points  What time? 0 points 0 points  Count back from 20 0 points 0 points  Months in reverse 0 points 0 points  Repeat phrase 4 points 0 points  Total Score 4 points 0 points    Immunizations Immunization History  Administered Date(s) Administered   Fluad Quad(high Dose 65+) 04/22/2019, 05/07/2020, 05/10/2021   Influenza Split 05/15/2011, 05/22/2012   Influenza Whole 06/07/2009, 05/11/2010   Influenza, High Dose Seasonal PF 04/22/2015, 05/07/2017, 05/14/2018, 04/11/2022    Influenza,inj,Quad PF,6+ Mos 05/09/2013, 04/20/2014   Influenza-Unspecified 05/17/2016   Moderna Covid-19 Vaccine Bivalent Booster 52yrs & up 07/01/2021   Moderna Sars-Covid-2 Vaccination 09/20/2019, 10/28/2019, 07/06/2020   Pneumococcal Conjugate-13 05/09/2013   Pneumococcal Polysaccharide-23 04/16/2006, 04/22/2015   Td 04/14/2008   Tdap 05/01/2022   Zoster Recombinat (Shingrix) 11/15/2021, 04/11/2022    TDAP status: Up to date  Flu Vaccine status: Up to date  Pneumococcal vaccine status: Up to date  Covid-19 vaccine status: Information provided on how to obtain vaccines.   Qualifies for Shingles Vaccine? Yes   Zostavax completed No   Shingrix Completed?: Yes  Screening Tests Health Maintenance  Topic Date Due   Medicare Annual Wellness (AWV)  01/18/2023   COVID-19 Vaccine (5 - 2023-24 season) 03/16/2023 (Originally 04/21/2022)   INFLUENZA VACCINE  03/22/2023   DTaP/Tdap/Td (3 - Td or Tdap) 05/01/2032   Pneumonia Vaccine 71+ Years old  Completed   Zoster Vaccines- Shingrix  Completed   HPV VACCINES  Aged Out   Hepatitis C Screening  Discontinued    Health Maintenance  Health Maintenance Due  Topic Date Due   Medicare Annual Wellness (AWV)  01/18/2023    Colorectal cancer screening: No longer required.   Lung Cancer Screening: (Low Dose CT Chest recommended if Age 39-80 years, 30 pack-year currently smoking OR have quit w/in 15years.) does not qualify.   Additional Screening:  Hepatitis C Screening: does qualify; Completed 04/11/21  Vision Screening: Recommended annual ophthalmology exams for early detection of glaucoma and other disorders of the eye. Is the patient up to date with their annual eye exam?  Yes  Who is the provider or what is the name of the office in which the patient attends annual eye exams? [facility]. If pt is not established with a provider, would they like to be referred to a provider to establish care? No .   Dental Screening:  Recommended annual dental exams for proper oral hygiene  Community Resource Referral / Chronic Care Management: CRR required this visit?  No   CCM required this visit?  No      Plan:     I have personally reviewed and  noted the following in the patient's chart:   Medical and social history Use of alcohol, tobacco or illicit drugs  Current medications and supplements including opioid prescriptions. Patient is not currently taking opioid prescriptions. Functional ability and status Nutritional status Physical activity Advanced directives List of other physicians Hospitalizations, surgeries, and ER visits in previous 12 months Vitals Screenings to include cognitive, depression, and falls Referrals and appointments  In addition, I have reviewed and discussed with patient certain preventive protocols, quality metrics, and best practice recommendations. A written personalized care plan for preventive services as well as general preventive health recommendations were provided to patient.   Due to this being a telephonic visit, the after visit summary with patients personalized plan was offered to patient via mail or my-chart. Patient would like to access on my-chart.  Donne Anon, [place]   01/24/2023   Nurse Notes: None

## 2023-01-24 NOTE — Patient Instructions (Signed)
[name] , Thank you for taking time to come for your Medicare Wellness Visit. I appreciate your ongoing commitment to your health goals. Please review the following plan we discussed and let me know if I can assist you in the future.      This is a list of the screening recommended for you and due dates:  Health Maintenance  Topic Date Due   COVID-19 Vaccine (5 - 2023-24 season) 03/16/2023*   Flu Shot  03/22/2023   Medicare Annual Wellness Visit  01/24/2024   DTaP/Tdap/Td vaccine (3 - Td or Tdap) 05/01/2032   Pneumonia Vaccine  Completed   Zoster (Shingles) Vaccine  Completed   HPV Vaccine  Aged Out   Hepatitis C Screening  Discontinued  *Topic was postponed. The date shown is not the original due date.    Next appointment: Follow up in one year for your annual wellness visit.   Preventive Care 64 Years and Older, Male Preventive care refers to lifestyle choices and visits with your health care provider that can promote health and wellness. What does preventive care include? A yearly physical exam. This is also called an annual well check. Dental exams once or twice a year. Routine eye exams. Ask your health care provider how often you should have your eyes checked. Personal lifestyle choices, including: Daily care of your teeth and gums. Regular physical activity. Eating a healthy diet. Avoiding tobacco and drug use. Limiting alcohol use. Practicing safe sex. Taking low doses of aspirin every day. Taking vitamin and mineral supplements as recommended by your health care provider. What happens during an annual well check? The services and screenings done by your health care provider during your annual well check will depend on your age, overall health, lifestyle risk factors, and family history of disease. Counseling  Your health care provider may ask you questions about your: Alcohol use. Tobacco use. Drug use. Emotional well-being. Home and relationship  well-being. Sexual activity. Eating habits. History of falls. Memory and ability to understand (cognition). Work and work Astronomer. Screening  You may have the following tests or measurements: Height, weight, and BMI. Blood pressure. Lipid and cholesterol levels. These may be checked every 5 years, or more frequently if you are over 81 years old. Skin check. Lung cancer screening. You may have this screening every year starting at age 81 if you have a 30-pack-year history of smoking and currently smoke or have quit within the past 15 years. Fecal occult blood test (FOBT) of the stool. You may have this test every year starting at age 9. Flexible sigmoidoscopy or colonoscopy. You may have a sigmoidoscopy every 5 years or a colonoscopy every 10 years starting at age 24. Prostate cancer screening. Recommendations will vary depending on your family history and other risks. Hepatitis C blood test. Hepatitis B blood test. Sexually transmitted disease (STD) testing. Diabetes screening. This is done by checking your blood sugar (glucose) after you have not eaten for a while (fasting). You may have this done every 1-3 years. Abdominal aortic aneurysm (AAA) screening. You may need this if you are a current or former smoker. Osteoporosis. You may be screened starting at age 16 if you are at high risk. Talk with your health care provider about your test results, treatment options, and if necessary, the need for more tests. Vaccines  Your health care provider may recommend certain vaccines, such as: Influenza vaccine. This is recommended every year. Tetanus, diphtheria, and acellular pertussis (Tdap, Td) vaccine. You may  need a Td booster every 10 years. Zoster vaccine. You may need this after age 79. Pneumococcal 13-valent conjugate (PCV13) vaccine. One dose is recommended after age 81. Pneumococcal polysaccharide (PPSV23) vaccine. One dose is recommended after age 81. Talk to your health care  provider about which screenings and vaccines you need and how often you need them. This information is not intended to replace advice given to you by your health care provider. Make sure you discuss any questions you have with your health care provider. Document Released: 09/03/2015 Document Revised: 04/26/2016 Document Reviewed: 06/08/2015 Elsevier Interactive Patient Education  2017 [employer].  Fall Prevention in the Home Falls can cause injuries. They can happen to people of all ages. There are many things you can do to make your home safe and to help prevent falls. What can I do on the outside of my home? Regularly fix the edges of walkways and driveways and fix any cracks. Remove anything that might make you trip as you walk through a door, such as a raised step or threshold. Trim any bushes or trees on the path to your home. Use bright outdoor lighting. Clear any walking paths of anything that might make someone trip, such as rocks or tools. Regularly check to see if handrails are loose or broken. Make sure that both sides of any steps have handrails. Any raised decks and porches should have guardrails on the edges. Have any leaves, snow, or ice cleared regularly. Use sand or salt on walking paths during winter. Clean up any spills in your garage right away. This includes oil or grease spills. What can I do in the bathroom? Use night lights. Install grab bars by the toilet and in the tub and shower. Do not use towel bars as grab bars. Use non-skid mats or decals in the tub or shower. If you need to sit down in the shower, use a plastic, non-slip stool. Keep the floor dry. Clean up any water that spills on the floor as soon as it happens. Remove soap buildup in the tub or shower regularly. Attach bath mats securely with double-sided non-slip rug tape. Do not have throw rugs and other things on the floor that can make you trip. What can I do in the bedroom? Use night lights. Make  sure that you have a light by your bed that is easy to reach. Do not use any sheets or blankets that are too big for your bed. They should not hang down onto the floor. Have a firm chair that has side arms. You can use this for support while you get dressed. Do not have throw rugs and other things on the floor that can make you trip. What can I do in the kitchen? Clean up any spills right away. Avoid walking on wet floors. Keep items that you use a lot in easy-to-reach places. If you need to reach something above you, use a strong step stool that has a grab bar. Keep electrical cords out of the way. Do not use floor polish or wax that makes floors slippery. If you must use wax, use non-skid floor wax. Do not have throw rugs and other things on the floor that can make you trip. What can I do with my stairs? Do not leave any items on the stairs. Make sure that there are handrails on both sides of the stairs and use them. Fix handrails that are broken or loose. Make sure that handrails are as long as the stairways.  Check any carpeting to make sure that it is firmly attached to the stairs. Fix any carpet that is loose or worn. Avoid having throw rugs at the top or bottom of the stairs. If you do have throw rugs, attach them to the floor with carpet tape. Make sure that you have a light switch at the top of the stairs and the bottom of the stairs. If you do not have them, ask someone to add them for you. What else can I do to help prevent falls? Wear shoes that: Do not have high heels. Have rubber bottoms. Are comfortable and fit you well. Are closed at the toe. Do not wear sandals. If you use a stepladder: Make sure that it is fully opened. Do not climb a closed stepladder. Make sure that both sides of the stepladder are locked into place. Ask someone to hold it for you, if possible. Clearly mark and make sure that you can see: Any grab bars or handrails. First and last steps. Where the  edge of each step is. Use tools that help you move around (mobility aids) if they are needed. These include: Canes. Walkers. Scooters. Crutches. Turn on the lights when you go into a dark area. Replace any light bulbs as soon as they burn out. Set up your furniture so you have a clear path. Avoid moving your furniture around. If any of your floors are uneven, fix them. If there are any pets around you, be aware of where they are. Review your medicines with your doctor. Some medicines can make you feel dizzy. This can increase your chance of falling. Ask your doctor what other things that you can do to help prevent falls. This information is not intended to replace advice given to you by your health care provider. Make sure you discuss any questions you have with your health care provider. Document Released: 06/03/2009 Document Revised: 01/13/2016 Document Reviewed: 09/11/2014 Elsevier Interactive Patient Education  2017 [employer].

## 2023-03-13 ENCOUNTER — Encounter: Payer: Self-pay | Admitting: Podiatry

## 2023-03-13 ENCOUNTER — Ambulatory Visit: Payer: Medicare Other | Admitting: Podiatry

## 2023-03-13 DIAGNOSIS — B351 Tinea unguium: Secondary | ICD-10-CM

## 2023-03-13 DIAGNOSIS — M79674 Pain in right toe(s): Secondary | ICD-10-CM | POA: Diagnosis not present

## 2023-03-13 DIAGNOSIS — M79675 Pain in left toe(s): Secondary | ICD-10-CM

## 2023-03-14 NOTE — Assessment & Plan Note (Signed)
Well controlled, no changes to meds. Encouraged heart healthy diet such as the DASH diet and exercise as tolerated.  °

## 2023-03-14 NOTE — Assessment & Plan Note (Signed)
Encouraged DASH or MIND diet, decrease po intake and increase exercise as tolerated. Needs 7-8 hours of sleep nightly. Avoid trans fats, eat small, frequent meals every 4-5 hours with lean proteins, complex carbs and healthy fats. Minimize simple carbs, high fat foods and processed foods 

## 2023-03-14 NOTE — Assessment & Plan Note (Signed)
No recent exacerbation 

## 2023-03-14 NOTE — Assessment & Plan Note (Signed)
hgba1c acceptable, minimize simple carbs. Increase exercise as tolerated. Continue current meds 

## 2023-03-14 NOTE — Progress Notes (Unsigned)
Subjective:    Patient ID: David Parsons, male    DOB: Jan 01, 1941, 82 y.o.   MRN: [id number]  No chief complaint on file.   HPI Discussed the use of AI scribe software for clinical note transcription with the patient, who gave verbal consent to proceed.  History of Present Illness        Patient is an 82 yo male in today for follow up on chronic medical concerns. No recent febrile illness or hospitalizations. Denies CP/palp/SOB/HA/congestion/fevers/GI or GU c/o. Taking meds as prescribed     Past Medical History:  Diagnosis Date  . Aortic insufficiency     02/16/09 Chest CT:  The aortic root has a diameter measuring 4.6 cm, image 64 of the coronal series.  The ascending    thoracic aorta has a diameter of 3.8 cm, image 65 of the coronal series.  At the level of the aortic arch    the thoracic aorta has a maximum diameter of 3.2 cm.  The descending thoracic aorta has a maximum           diameter of 3.2 cm.  There is no evidence for aortic disse        . Aortic root enlargement (HCC)    The aortic root has a diameter measuring 4.6 cm, image 64 of the coronal series.  The ascending  thoracic aorta has a diameter of 3.8 cm, image 65 of the coronal series.  At the level of the aortic arch   the thoracic aorta has a maximum diameter of 3.2 cm.  The descending thoracic aorta has a maximum   diameter of 3.2 cm.  There is no evidence for aortic dissection.       Marland Kitchen CAD (coronary artery disease)   . Cardiomyopathy     01/2009 - Cath - nonobs. dzs.  EF 35%.  . CHF (congestive heart failure) (HCC)   . Diabetes mellitus   . Dyslipidemia 01/14/2013  . Gallstones    s/p cholecystectomy    . GERD (gastroesophageal reflux disease)   . Glaucoma 10/05/2016  . Heart murmur   . Helicobacter pylori gastritis    (Tx Pylera 4/10)  . Hypertension   . Hypokalemia   . Iron deficiency anemia   . Malignant neoplasm of prostate (HCC) 02/03/2021  . Obesity, Class III, BMI 40-49.9 (morbid obesity) (HCC)   .  Onychomycosis 03/09/2015  . PONV (postoperative nausea and vomiting)   . 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 Sleep apnea    occasionally wears CPAP at night    Past Surgical History:  Procedure Laterality Date  . ADRENALECTOMY    . BENTALL PROCEDURE  12/28/2011   Procedure: BENTALL PROCEDURE;  Surgeon: Purcell Nails, MD;  Location: [facility] OR;  Service: Open Heart Surgery;  Laterality: N/A;  . CARDIAC CATHETERIZATION     2013  . CARDIAC VALVE REPLACEMENT  12/28/2011   Bentall with tissue valve  . CHOLECYSTECTOMY    . COLONOSCOPY  09/25/2011  . COLONOSCOPY W/ BIOPSIES AND POLYPECTOMY  11/04/2008   colon polyps, diverticulosis   . CORONARY ARTERY BYPASS GRAFT  12/28/2011   Procedure: CORONARY ARTERY BYPASS GRAFTING (CABG);  Surgeon: Purcell Nails, MD;  Location[facility];  Service: Open Heart  Surgery;  Laterality: N/A;  cabg x 1  . LEFT AND RIGHT HEART CATHETERIZATION WITH CORONARY ANGIOGRAM N/A 11/22/2011   Procedure: LEFT AND RIGHT HEART CATHETERIZATION WITH CORONARY ANGIOGRAM;  Surgeon: Kathleene Hazel, MD;  Location: [facility] CATH LAB;  Service: Cardiovascular;  Laterality: N/A;  . MULTIPLE TOOTH EXTRACTIONS    . PROSTATE BIOPSY Bilateral   . TEE WITHOUT CARDIOVERSION  11/22/2011   Procedure: TRANSESOPHAGEAL ECHOCARDIOGRAM (TEE);  Surgeon: Laurey Morale, MD;  Location: [facility] ENDOSCOPY;  Service: Cardiovascular;  Laterality: N/A;  . UPPER GASTROINTESTINAL ENDOSCOPY  11/04/2008   w/bx, H pylori gastritis  . UPPER GASTROINTESTINAL ENDOSCOPY  09/25/2011    Family History  Problem Relation Age of Onset  . Breast cancer Sister   . Diabetes Sister   . Colon cancer Brother 70       at least 70  . Diabetes Brother   . Cancer Brother         colon, prostate  . Prostate cancer Brother   . Diabetes Brother   . Diabetes Sister   . Cancer Sister        breast, colon, melanoma  . Heart disease Sister   . Hypertension Mother   . Arthritis Mother   . Kidney failure Sister   . Colon cancer Sister   . Breast cancer Sister   . Kidney disease Sister        s/p transplant  . Hypertension Sister   . Diabetes Sister   . Stroke Father   . Hypertension Father     Social History   Socioeconomic History  . Marital status: Married    Spouse name: Steward Drone  . Number of children: 0  . Years of education: Not on file  . 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 Smoking status: Never  . Smokeless tobacco: Never  Vaping Use  . Vaping status: Never Used  Substance and Sexual Activity  . Alcohol use: No  . Drug use: No  . Sexual activity: Not Currently    Comment: lives with wife, pastros 2 churches  Other Topics Concern  . Not on file  Social History Narrative   The patient is married, lives with his wife in  [place].  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  He  lives a very sedentary lifestyle.  He is a nonsmoker.  He denies alcohol consumption.    Social Determinants of Health   Financial Resource Strain: Low Risk  (03/21/2022)   Overall Financial Resource Strain (CARDIA)   . Difficulty of Paying Living Expenses: Not hard at all  Food Insecurity: No Food Insecurity (01/24/2023)   Hunger Vital Sign   . Worried About Programme researcher, broadcasting/film/video in the Last Year: Never true   . Ran Out of Food in the Last Year: Never true  Transportation Needs: No Transportation Needs (01/24/2023)   PRAPARE - Transportation   . Lack of Transportation (Medical): No   . Lack of Transportation (Non-Medical): No  Physical Activity: Insufficiently Active (03/21/2022)   Exercise Vital Sign   . Days of Exercise per Week: 3 days    . Minutes of Exercise per Session: 40 min  Stress: No Stress Concern Present (08/02/2021)   [employer] of Occupational Health - Occupational Stress Questionnaire   . Feeling of Stress : Not at all  Social  Connections: Socially Integrated (08/02/2021)   Social Connection and Isolation Panel [NHANES]   . Frequency of Communication with Friends and Family: More than three times a week   . Frequency of Social Gatherings with Friends and Family: Three times a week   . Attends Religious Services: More than 4 times per year   . Active Member of Clubs or Organizations: Yes   . Attends Banker Meetings: More than 4 times per year   . Marital Status: Married  Catering manager Violence: Not At Risk (08/02/2021)   Humiliation, Afraid, Rape, and Kick questionnaire   . Fear of Current or Ex-Partner: No   . Emotionally Abused: No   . Physically Abused: No   . Sexually Abused: No    Outpatient Medications Prior to Visit  Medication Sig Dispense Refill  . amLODipine (NORVASC) 10 MG tablet TAKE 1 TABLET BY MOUTH EVERY DAY 90 tablet 1  . aspirin EC 81 MG tablet Take 81 mg by mouth daily.    . carvedilol (COREG) 25 MG tablet TAKE 1 TABLET BY MOUTH TWICE A DAY WITH FOOD 180 tablet 1  . Cholecalciferol (VITAMIN D3) 50 MCG (2000 UT) CAPS Take 1 capsule by mouth daily.    . diazepam (VALIUM) 5 MG tablet TAKE 1/2-2 TABLETS BY MOUTH DAILY AS NEEDED FOR ANXIETY. 10 tablet 1  . ferrous sulfate 325 (65 FE) MG tablet Take 1 tablet (325 mg total) by mouth every other day.    . furosemide (LASIX) 40 MG tablet Take 1 tablet (40 mg total) by mouth daily. 90 tablet 1  . KLOR-CON M20 20 MEQ tablet TAKE 3 TABLETS (60 MEQ TOTAL) BY MOUTH 3 (THREE) TIMES DAILY. 810 tablet 1  . latanoprost (XALATAN) 0.005 % ophthalmic solution 1 drop at bedtime.    Marland Kitchen losartan (COZAAR) 50 MG tablet TAKE 1 TABLET BY MOUTH EVERY DAY 90 tablet 1  . Multiple Vitamin (MULITIVITAMIN WITH MINERALS) TABS Take 1 tablet by mouth  daily.    Marland Kitchen omeprazole (PRILOSEC) 40 MG capsule Take 1 capsule (40 mg total) by mouth daily. 90 capsule 1  . Probiotic Product (PROBIOTIC DAILY PO) Take 1 tablet by mouth daily.    Marland Kitchen Propylene Glycol (SYSTANE BALANCE OP) Place 1 drop into both eyes in the morning and at bedtime. For dry eyes.    . tamsulosin (FLOMAX) 0.4 MG CAPS capsule Take 1 capsule (0.4 mg total) by mouth daily. 30 capsule 3  . triamcinolone ointment (KENALOG) 0.1 % SMARTSIG:1 Topical Daily    . triamterene-hydrochlorothiazide (MAXZIDE-25) 37.5-25 MG tablet TAKE 1 TABLET BY MOUTH EVERY DAY 90 tablet 2   No facility-administered medications prior to visit.    Allergies  Allergen Reactions  . Latex Hives    Latex gloves     Review of Systems  Constitutional:  Negative for fever and malaise/fatigue.  HENT:  Negative for congestion.   Eyes:  Negative for blurred vision.  Respiratory:  Negative for shortness of breath.   Cardiovascular:  Negative for chest pain, palpitations and leg swelling.  Gastrointestinal:  Negative for abdominal pain, blood in stool and nausea.  Genitourinary:  Negative for dysuria and frequency.  Musculoskeletal:  Negative for falls.  Skin:  Negative for rash.  Neurological:  Negative for dizziness, loss of consciousness and headaches.  Endo/Heme/Allergies:  Negative for environmental allergies.  Psychiatric/Behavioral:  Negative for depression. The patient is not nervous/anxious.       Objective:    Physical Exam Vitals reviewed.  Constitutional:  Appearance: Normal appearance. He is not ill-appearing.  HENT:     Head: Normocephalic and atraumatic.     Nose: Nose normal.  Eyes:     Conjunctiva/sclera: Conjunctivae normal.  Cardiovascular:     Rate and Rhythm: Normal rate.     Pulses: Normal pulses.     Heart sounds: Normal heart sounds. No murmur heard. Pulmonary:     Effort: Pulmonary effort is normal.     Breath sounds: Normal breath sounds. No wheezing.  Abdominal:      Palpations: Abdomen is soft. There is no mass.     Tenderness: There is no abdominal tenderness.  Musculoskeletal:     Cervical back: Normal range of motion.     Right lower leg: No edema.     Left lower leg: No edema.  Skin:    General: Skin is warm and dry.  Neurological:     General: No focal deficit present.     Mental Status: He is alert and oriented to person, place, and time.  Psychiatric:        Mood and Affect: Mood normal.   There were no vitals taken for this visit. Wt Readings from Last 3 Encounters:  01/24/23 257 lb (116.6 kg)  12/26/22 258 lb 9.6 oz (117.3 kg)  11/07/22 267 lb 9.6 oz (121.4 kg)    Diabetic Foot Exam - Simple   No data filed    Lab Results  Component Value Date   WBC 6.4 10/18/2022   HGB 13.1 10/18/2022   HCT 39.1 10/18/2022   PLT 281.0 10/18/2022   GLUCOSE 123 (H) 11/07/2022   CHOL 163 10/18/2022   TRIG 188.0 (H) 10/18/2022   HDL 35.20 (L) 10/18/2022   LDLCALC 90 10/18/2022   ALT 10 11/07/2022   AST 14 11/07/2022   NA 142 11/07/2022   K 3.5 11/07/2022   CL 100 11/07/2022   CREATININE 1.00 11/07/2022   BUN 11 11/07/2022   CO2 31 11/07/2022   TSH 2.59 10/18/2022   PSA 23.37 (H) 05/10/2020   INR 1.21 01/02/2012   HGBA1C 5.3 10/18/2022    Lab Results  Component Value Date   TSH 2.59 10/18/2022   Lab Results  Component Value Date   WBC 6.4 10/18/2022   HGB 13.1 10/18/2022   HCT 39.1 10/18/2022   MCV 87.7 10/18/2022   PLT 281.0 10/18/2022   Lab Results  Component Value Date   NA 142 11/07/2022   K 3.5 11/07/2022   CO2 31 11/07/2022   GLUCOSE 123 (H) 11/07/2022   BUN 11 11/07/2022   CREATININE 1.00 11/07/2022   BILITOT 1.3 (H) 11/07/2022   ALKPHOS 70 11/07/2022   AST 14 11/07/2022   ALT 10 11/07/2022   PROT 6.6 11/07/2022   ALBUMIN 3.9 11/07/2022   CALCIUM 9.6 11/07/2022   GFR 70.98 11/07/2022   Lab Results  Component Value Date   CHOL 163 10/18/2022   Lab Results  Component Value Date   HDL 35.20 (L)  10/18/2022   Lab Results  Component Value Date   LDLCALC 90 10/18/2022   Lab Results  Component Value Date   TRIG 188.0 (H) 10/18/2022   Lab Results  Component Value Date   CHOLHDL 5 10/18/2022   Lab Results  Component Value Date   HGBA1C 5.3 10/18/2022       Assessment & Plan:  Obesity, Class III, BMI 40-49.9 (morbid obesity) (HCC) Assessment & Plan: Encouraged DASH or MIND diet, decrease po intake and increase exercise  as tolerated. Needs 7-8 hours of sleep nightly. Avoid trans fats, eat small, frequent meals every 4-5 hours with lean proteins, complex carbs and healthy fats. Minimize simple carbs, high fat foods and processed foods    Essential hypertension Assessment & Plan: Well controlled, no changes to meds. Encouraged heart healthy diet such as the DASH diet and exercise as tolerated.     Hypertensive heart disease without heart failure Assessment & Plan: No recent exacerbation   Hyperglycemia Assessment & Plan: hgba1c acceptable, minimize simple carbs. Increase exercise as tolerated. Continue current meds    Dyslipidemia Assessment & Plan: Encourage heart healthy diet such as MIND or DASH diet, increase exercise, avoid trans fats, simple carbohydrates and processed foods, consider a krill or fish or flaxseed oil cap daily.       Assessment and Plan              Danise Edge, MD

## 2023-03-14 NOTE — Assessment & Plan Note (Signed)
Encourage heart healthy diet such as MIND or DASH diet, increase exercise, avoid trans fats, simple carbohydrates and processed foods, consider a krill or fish or flaxseed oil cap daily.  °

## 2023-03-15 ENCOUNTER — Ambulatory Visit (INDEPENDENT_AMBULATORY_CARE_PROVIDER_SITE_OTHER): Payer: Medicare Other | Admitting: Family Medicine

## 2023-03-15 VITALS — BP 128/80 | HR 65 | Temp 97.8°F | Resp 16 | Ht 73.0 in | Wt 264.6 lb

## 2023-03-15 DIAGNOSIS — Z6834 Body mass index (BMI) 34.0-34.9, adult: Secondary | ICD-10-CM | POA: Diagnosis not present

## 2023-03-15 DIAGNOSIS — I119 Hypertensive heart disease without heart failure: Secondary | ICD-10-CM | POA: Diagnosis not present

## 2023-03-15 DIAGNOSIS — E785 Hyperlipidemia, unspecified: Secondary | ICD-10-CM

## 2023-03-15 DIAGNOSIS — I10 Essential (primary) hypertension: Secondary | ICD-10-CM

## 2023-03-15 DIAGNOSIS — R739 Hyperglycemia, unspecified: Secondary | ICD-10-CM | POA: Diagnosis not present

## 2023-03-15 LAB — CBC WITH DIFFERENTIAL/PLATELET
Basophils Absolute: 0.1 10*3/uL (ref 0.0–0.1)
Basophils Relative: 2.1 % (ref 0.0–3.0)
Eosinophils Absolute: 0.3 10*3/uL (ref 0.0–0.7)
Eosinophils Relative: 3.8 % (ref 0.0–5.0)
HCT: 40.1 % (ref 39.0–52.0)
Hemoglobin: 13 g/dL (ref 13.0–17.0)
Lymphocytes Relative: 16.8 % (ref 12.0–46.0)
Lymphs Abs: 1.2 10*3/uL (ref 0.7–4.0)
MCHC: 32.3 g/dL (ref 30.0–36.0)
MCV: 90.3 fl (ref 78.0–100.0)
Monocytes Absolute: 0.8 10*3/uL (ref 0.1–1.0)
Monocytes Relative: 11.6 % (ref 3.0–12.0)
Neutro Abs: 4.6 10*3/uL (ref 1.4–7.7)
Neutrophils Relative %: 65.7 % (ref 43.0–77.0)
Platelets: 299 10*3/uL (ref 150.0–400.0)
RBC: 4.45 Mil/uL (ref 4.22–5.81)
RDW: 13.3 % (ref 11.5–15.5)
WBC: 7.1 10*3/uL (ref 4.0–10.5)

## 2023-03-15 LAB — COMPREHENSIVE METABOLIC PANEL
ALT: 10 U/L (ref 0–53)
AST: 14 U/L (ref 0–37)
Albumin: 4 g/dL (ref 3.5–5.2)
Alkaline Phosphatase: 67 U/L (ref 39–117)
BUN: 9 mg/dL (ref 6–23)
CO2: 33 mEq/L — ABNORMAL HIGH (ref 19–32)
Calcium: 9.5 mg/dL (ref 8.4–10.5)
Chloride: 98 mEq/L (ref 96–112)
Creatinine, Ser: 0.89 mg/dL (ref 0.40–1.50)
GFR: 80.62 mL/min (ref >60.00)
Glucose, Bld: 117 mg/dL — ABNORMAL HIGH (ref 70–99)
Potassium: 3 mEq/L — ABNORMAL LOW (ref 3.5–5.1)
Sodium: 140 mEq/L (ref 135–145)
Total Bilirubin: 1.6 mg/dL — ABNORMAL HIGH (ref 0.2–1.2)
Total Protein: 6.6 g/dL (ref 6.0–8.3)

## 2023-03-15 LAB — LIPID PANEL
Cholesterol: 182 mg/dL (ref 0–200)
HDL: 39.7 mg/dL (ref >39.00)
LDL Cholesterol: 111 mg/dL — ABNORMAL HIGH (ref 0–99)
NonHDL: 142.59
Total CHOL/HDL Ratio: 5
Triglycerides: 160 mg/dL — ABNORMAL HIGH (ref 0.0–149.0)
VLDL: 32 mg/dL (ref 0.0–40.0)

## 2023-03-15 LAB — TSH: TSH: 2.13 u[IU]/mL (ref 0.35–5.50)

## 2023-03-15 LAB — HEMOGLOBIN A1C: Hgb A1c MFr Bld: 5.1 % (ref 4.6–6.5)

## 2023-03-15 NOTE — Patient Instructions (Addendum)
Respiratory Syncitial Virus Vaccine, Arexvy at pharmacy  Covid and flu boosters in September  Hypertension, Adult High blood pressure (hypertension) is when the force of blood pumping through the arteries is too strong. The arteries are the blood vessels that carry blood from the heart throughout the body. Hypertension forces the heart to work harder to pump blood and may cause arteries to become narrow or stiff. Untreated or uncontrolled hypertension can lead to a heart attack, heart failure, a stroke, kidney disease, and other problems. A blood pressure reading consists of a higher number over a lower number. Ideally, your blood pressure should be below 120/80. The first ("top") number is called the systolic pressure. It is a measure of the pressure in your arteries as your heart beats. The second ("bottom") number is called the diastolic pressure. It is a measure of the pressure in your arteries as the heart relaxes. What are the causes? The exact cause of this condition is not known. There are some conditions that result in high blood pressure. What increases the risk? Certain factors may make you more likely to develop high blood pressure. Some of these risk factors are under your control, including: Smoking. Not getting enough exercise or physical activity. Being overweight. Having too much fat, sugar, calories, or salt (sodium) in your diet. Drinking too much alcohol. Other risk factors include: Having a personal history of heart disease, diabetes, high cholesterol, or kidney disease. Stress. Having a family history of high blood pressure and high cholesterol. Having obstructive sleep apnea. Age. The risk increases with age. What are the signs or symptoms? High blood pressure may not cause symptoms. Very high blood pressure (hypertensive crisis) may cause: Headache. Fast or irregular heartbeats (palpitations). Shortness of breath. Nosebleed. Nausea and vomiting. Vision  changes. Severe chest pain, dizziness, and seizures. How is this diagnosed? This condition is diagnosed by measuring your blood pressure while you are seated, with your arm resting on a flat surface, your legs uncrossed, and your feet flat on the floor. The cuff of the blood pressure monitor will be placed directly against the skin of your upper arm at the level of your heart. Blood pressure should be measured at least twice using the same arm. Certain conditions can cause a difference in blood pressure between your right and left arms. If you have a high blood pressure reading during one visit or you have normal blood pressure with other risk factors, you may be asked to: Return on a different day to have your blood pressure checked again. Monitor your blood pressure at home for 1 week or longer. If you are diagnosed with hypertension, you may have other blood or imaging tests to help your health care provider understand your overall risk for other conditions. How is this treated? This condition is treated by making healthy lifestyle changes, such as eating healthy foods, exercising more, and reducing your alcohol intake. You may be referred for counseling on a healthy diet and physical activity. Your health care provider may prescribe medicine if lifestyle changes are not enough to get your blood pressure under control and if: Your systolic blood pressure is above 130. Your diastolic blood pressure is above 80. Your personal target blood pressure may vary depending on your medical conditions, your age, and other factors. Follow these instructions at home: Eating and drinking  Eat a diet that is high in fiber and potassium, and low in sodium, added sugar, and fat. An example of this eating plan is called the  DASH diet. DASH stands for Dietary Approaches to Stop Hypertension. To eat this way: Eat plenty of fresh fruits and vegetables. Try to fill one half of your plate at each meal with fruits and  vegetables. Eat whole grains, such as whole-wheat pasta, brown rice, or whole-grain bread. Fill about one fourth of your plate with whole grains. Eat or drink low-fat dairy products, such as skim milk or low-fat yogurt. Avoid fatty cuts of meat, processed or cured meats, and poultry with skin. Fill about one fourth of your plate with lean proteins, such as fish, chicken without skin, beans, eggs, or tofu. Avoid pre-made and processed foods. These tend to be higher in sodium, added sugar, and fat. Reduce your daily sodium intake. Many people with hypertension should eat less than 1,500 mg of sodium a day. Do not drink alcohol if: Your health care provider tells you not to drink. You are pregnant, may be pregnant, or are planning to become pregnant. If you drink alcohol: Limit how much you have to: 0-1 drink a day for women. 0-2 drinks a day for men. Know how much alcohol is in your drink. In the [place] one drink equals one 12 oz bottle of beer (355 mL), one 5 oz glass of wine (148 mL), or one 1 oz glass of hard liquor (44 mL). Lifestyle  Work with your health care provider to maintain a healthy body weight or to lose weight. Ask what an ideal weight is for you. Get at least 30 minutes of exercise that causes your heart to beat faster (aerobic exercise) most days of the week. Activities may include walking, swimming, or biking. Include exercise to strengthen your muscles (resistance exercise), such as Pilates or lifting weights, as part of your weekly exercise routine. Try to do these types of exercises for 30 minutes at least 3 days a week. Do not use any products that contain nicotine or tobacco. These products include cigarettes, chewing tobacco, and vaping devices, such as e-cigarettes. If you need help quitting, ask your health care provider. Monitor your blood pressure at home as told by your health care provider. Keep all follow-up visits. This is important. Medicines Take  over-the-counter and prescription medicines only as told by your health care provider. Follow directions carefully. Blood pressure medicines must be taken as prescribed. Do not skip doses of blood pressure medicine. Doing this puts you at risk for problems and can make the medicine less effective. Ask your health care provider about side effects or reactions to medicines that you should watch for. Contact a health care provider if you: Think you are having a reaction to a medicine you are taking. Have headaches that keep coming back (recurring). Feel dizzy. Have swelling in your ankles. Have trouble with your vision. Get help right away if you: Develop a severe headache or confusion. Have unusual weakness or numbness. Feel faint. Have severe pain in your chest or abdomen. Vomit repeatedly. Have trouble breathing. These symptoms may be an emergency. Get help right away. Call 911. Do not wait to see if the symptoms will go away. Do not drive yourself to the hospital. Summary Hypertension is when the force of blood pumping through your arteries is too strong. If this condition is not controlled, it may put you at risk for serious complications. Your personal target blood pressure may vary depending on your medical conditions, your age, and other factors. For most people, a normal blood pressure is less than 120/80. Hypertension is treated with lifestyle  changes, medicines, or a combination of both. Lifestyle changes include losing weight, eating a healthy, low-sodium diet, exercising more, and limiting alcohol. This information is not intended to replace advice given to you by your health care provider. Make sure you discuss any questions you have with your health care provider. Document Revised: 06/14/2021 Document Reviewed: 06/14/2021 Elsevier Patient Education  2024 [employer].

## 2023-03-16 ENCOUNTER — Other Ambulatory Visit: Payer: Self-pay

## 2023-03-16 DIAGNOSIS — E876 Hypokalemia: Secondary | ICD-10-CM

## 2023-03-16 NOTE — Addendum Note (Signed)
Addended by: Judieth Keens on: 03/16/2023 05:00 PM   Modules accepted: Orders

## 2023-03-18 NOTE — Progress Notes (Signed)
  Subjective:  Patient ID: David Parsons, male    DOB: [birth year],  MRN: [id number]  David Parsons presents to clinic today for: painful elongated mycotic toenails 1-5 bilaterally which are tender when wearing enclosed shoe gear. Pain is relieved with periodic professional debridement. Patient states he has lost weight due to having thrush which finally resolved. Chief Complaint  Patient presents with   NAIL CARE    RFC NO OTHER COMPLAINTS    PCP is Bradd Canary, MD.  Allergies  Allergen Reactions   Latex Hives    Latex gloves     Review of Systems: Negative except as noted in the HPI.  Objective: No changes noted in today's physical examination. There were no vitals filed for this visit.  David Parsons is a pleasant 82 y.o. male in NAD. AAO x 3.  Vascular Examination: Capillary refill time <3 seconds b/l LE. Palpable pedal pulses b/l LE. Digital hair absent b/l. No pedal edema b/l. Skin temperature gradient WNL b/l. No varicosities b/l. Marland Kitchen  Dermatological Examination: Pedal skin with normal turgor, texture and tone b/l. No open wounds. No interdigital macerations b/l. Toenails 1-5 b/l thickened, discolored, dystrophic with subungual debris. There is pain on palpation to dorsal aspect of nailplates. Hyperkeratotic lesion(s) submet head 5 b/l.  No erythema, no edema, no drainage, no fluctuance..  Neurological Examination: Protective sensation intact with 10 gram monofilament b/l LE. Vibratory sensation intact b/l LE.   Musculoskeletal Examination: Hammertoe deformity noted 2-5 b/l. Pes planus deformity noted bilateral LE.     Latest Ref Rng & Units 03/15/2023    8:48 AM 10/18/2022    7:54 AM 05/04/2022    2:15 PM  Hemoglobin A1C  Hemoglobin-A1c 4.6 - 6.5 % 5.1  5.3  5.4    Assessment/Plan: 1. Pain due to onychomycosis of toenails of both feet     -Consent given for treatment as described below: -Examined patient. -Patient to continue soft, supportive shoe gear  daily. -Toenails 1-5 b/l were debrided in length and girth with sterile nail nippers and dremel without iatrogenic bleeding.  -As a courtesy, callus(es) submet head 5 b/l gently filed without complication or incident. Total number pared=2. -Patient/POA to call should there be question/concern in the interim.   Return in about 3 months (around 06/13/2023).  Freddie Breech, DPM

## 2023-03-19 ENCOUNTER — Other Ambulatory Visit (INDEPENDENT_AMBULATORY_CARE_PROVIDER_SITE_OTHER): Payer: Medicare Other

## 2023-03-19 DIAGNOSIS — E876 Hypokalemia: Secondary | ICD-10-CM

## 2023-03-19 LAB — COMPREHENSIVE METABOLIC PANEL
ALT: 10 U/L (ref 0–53)
AST: 15 U/L (ref 0–37)
Albumin: 3.8 g/dL (ref 3.5–5.2)
Alkaline Phosphatase: 61 U/L (ref 39–117)
BUN: 10 mg/dL (ref 6–23)
CO2: 29 mEq/L (ref 19–32)
Calcium: 9.2 mg/dL (ref 8.4–10.5)
Chloride: 101 mEq/L (ref 96–112)
Creatinine, Ser: 0.98 mg/dL (ref 0.40–1.50)
GFR: 72.54 mL/min (ref >60.00)
Glucose, Bld: 107 mg/dL — ABNORMAL HIGH (ref 70–99)
Potassium: 3.5 mEq/L (ref 3.5–5.1)
Sodium: 139 mEq/L (ref 135–145)
Total Bilirubin: 1.3 mg/dL — ABNORMAL HIGH (ref 0.2–1.2)
Total Protein: 6.2 g/dL (ref 6.0–8.3)

## 2023-04-03 ENCOUNTER — Ambulatory Visit: Payer: Medicare Other | Admitting: Cardiovascular Disease

## 2023-04-28 ENCOUNTER — Other Ambulatory Visit: Payer: Self-pay | Admitting: Family Medicine

## 2023-04-28 DIAGNOSIS — Z23 Encounter for immunization: Secondary | ICD-10-CM | POA: Diagnosis not present

## 2023-05-02 DIAGNOSIS — H401132 Primary open-angle glaucoma, bilateral, moderate stage: Secondary | ICD-10-CM | POA: Diagnosis not present

## 2023-05-02 DIAGNOSIS — H04123 Dry eye syndrome of bilateral lacrimal glands: Secondary | ICD-10-CM | POA: Diagnosis not present

## 2023-05-02 DIAGNOSIS — H25813 Combined forms of age-related cataract, bilateral: Secondary | ICD-10-CM | POA: Diagnosis not present

## 2023-05-02 DIAGNOSIS — H35371 Puckering of macula, right eye: Secondary | ICD-10-CM | POA: Diagnosis not present

## 2023-05-07 ENCOUNTER — Ambulatory Visit
Admission: RE | Admit: 2023-05-07 | Discharge: 2023-05-07 | Disposition: A | Discharge status: Home / Self Care | Payer: Medicare Other | Source: Ambulatory Visit | Attending: Emergency Medicine | Admitting: Emergency Medicine

## 2023-05-07 VITALS — BP 150/86 | HR 70 | Temp 98.6°F | Resp 18

## 2023-05-07 DIAGNOSIS — M5432 Sciatica, left side: Secondary | ICD-10-CM

## 2023-05-07 MED ORDER — PREDNISONE 20 MG PO TABS: 20.0000 mg | ORAL_TABLET | Freq: Every day | ORAL | 0 refills | Status: AC

## 2023-05-07 NOTE — Discharge Instructions (Signed)
Starting tomorrow, take the prednisone 20 mg daily for 5 days in a row This should help to reduce inflammation that may be causing your symptoms Please follow up with your orthopedic or sports medicine specialist!

## 2023-05-07 NOTE — ED Triage Notes (Signed)
Pt c/o pain in L hip x2-3 days. States pain is present upon standing, and will shoot from "the ball of the hip down the leg." Pain improves after a few mins. No pain at rest. Hx of sciatic nerve pain several years ago.

## 2023-05-07 NOTE — ED Provider Notes (Signed)
[facility]    CSN: [id number] Arrival date & time: 05/07/23  1301     History   Chief Complaint Chief Complaint  Patient presents with   Hip Pain    HPI David Parsons is a 82 y.o. male.  Here with 2-3 day history of left leg/glute pain No pain at rest. Symptoms are intermittent when he goes from sitting to standing, lats for a few seconds. Feels shooting pain from left outer glute into leg. Although does not happen every time he stands. Notices more often when he's been sitting for a long period of time Denies any sensation change or extremity weakness No injury, trauma, or fall  Reports history of sciatica several years ago that felt similar  Has used tylenol   Past Medical History:  Diagnosis Date   Aortic insufficiency     02/16/09 Chest CT:  The aortic root has a diameter measuring 4.6 cm, image 64 of the coronal series.  The ascending    thoracic aorta has a diameter of 3.8 cm, image 65 of the coronal series.  At the level of the aortic arch    the thoracic aorta has a maximum diameter of 3.2 cm.  The descending thoracic aorta has a maximum           diameter of 3.2 cm.  There is no evidence for aortic disse         Aortic root enlargement (HCC)    The aortic root has a diameter measuring 4.6 cm, image 64 of the coronal series.  The ascending  thoracic aorta has a diameter of 3.8 cm, image 65 of the coronal series.  At the level of the aortic arch   the thoracic aorta has a maximum diameter of 3.2 cm.  The descending thoracic aorta has a maximum   diameter of 3.2 cm.  There is no evidence for aortic dissection.        CAD (coronary artery disease)    Cardiomyopathy     01/2009 - Cath - nonobs. dzs.  EF 35%.   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   Biological Bentall Aortic Root Replacement using 25 mm Edwards Magna Ease pericardial tissue valve and 28 mm Vascutek Gelweave Valsalva conduit with reimplantation of left main and right coronary artery and CABG x1 using SVG to RCA   Sleep apnea    occasionally wears CPAP at night    Patient Active Problem List   Diagnosis Date Noted   Anemia 05/25/2022   Malignant neoplasm of prostate (HCC) 02/03/2021   Trigger finger of right hand 09/22/2020   Anxiety attack 08/04/2019   Glaucoma 10/05/2016   Onychomycosis 03/09/2015   Hypokalemia 07/26/2014   Medicare annual wellness visit, subsequent 04/20/2014   Dyslipidemia 01/14/2013   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   Follow-up examination, following unspecified surgery 01/22/2012   S/P aortic valve replacement and aortoplasty  12/28/2011   Dystrophic nail 10/23/2011   Iron deficiency anemia 06/29/2011   Personal history of colonic polyps 06/29/2011   Family history of malignant neoplasm of gastrointestinal tract 06/29/2011   CAD, NATIVE VESSEL 01/18/2010   Obesity, Class III, BMI 40-49.9 (morbid obesity) (HCC) 11/29/2009   Essential hypertension 04/05/2009   Hyperglycemia 04/05/2009   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   AORTIC INSUFFICIENCY, MODERATE 02/17/2009   CARDIOMYOPATHY, DILATED 02/17/2009   Thoracic aneurysm without mention of rupture 01/26/2009   Congestive heart failure (HCC) 01/04/2009   NONSPECIFIC ABNORMAL ELECTROCARDIOGRAM 01/04/2009    Past Surgical History:  Procedure Laterality Date   ADRENALECTOMY     BENTALL PROCEDURE  12/28/2011   Procedure: BENTALL PROCEDURE;  Surgeon: Purcell Nails, MD;   Location: [facility] OR;  Service: Open Heart Surgery;  Laterality: N/A;   CARDIAC CATHETERIZATION     2013   CARDIAC VALVE REPLACEMENT  12/28/2011   Bentall with tissue valve   CHOLECYSTECTOMY     COLONOSCOPY  09/25/2011   COLONOSCOPY W/ BIOPSIES AND POLYPECTOMY  11/04/2008   colon polyps, diverticulosis    CORONARY ARTERY BYPASS GRAFT  12/28/2011   Procedure: CORONARY ARTERY BYPASS GRAFTING (CABG);  Surgeon: Purcell Nails, MD;  Location: [facility] OR;  Service: Open Heart Surgery;  Laterality: N/A;  cabg x 1   LEFT AND RIGHT HEART CATHETERIZATION WITH CORONARY ANGIOGRAM N/A 11/22/2011   Procedure: LEFT AND RIGHT HEART CATHETERIZATION WITH CORONARY ANGIOGRAM;  Surgeon: Kathleene Hazel, MD;  Location: [facility] CATH LAB;  Service: Cardiovascular;  Laterality: N/A;   MULTIPLE TOOTH EXTRACTIONS     PROSTATE BIOPSY Bilateral    TEE WITHOUT CARDIOVERSION  11/22/2011   Procedure: TRANSESOPHAGEAL ECHOCARDIOGRAM (TEE);  Surgeon: Laurey Morale, MD;  Location: [facility] ENDOSCOPY;  Service: Cardiovascular;  Laterality: N/A;   UPPER GASTROINTESTINAL ENDOSCOPY  11/04/2008   w/bx, H pylori gastritis   UPPER GASTROINTESTINAL ENDOSCOPY  09/25/2011       Home Medications    Prior to Admission medications   Medication Sig Start Date End Date Taking? Authorizing Provider  predniSONE (DELTASONE) 20 MG tablet Take 1 tablet (20 mg total) by mouth daily with breakfast for 5 days. 05/07/23 05/12/23 Yes Arihana Ambrocio, Lurena Joiner, PA-C  amLODipine (NORVASC) 10 MG tablet TAKE 1 TABLET BY MOUTH EVERY DAY 04/30/23   Bradd Canary, MD  aspirin EC 81 MG tablet Take 81 mg by mouth daily.    [provider]  carvedilol (COREG) 25 MG tablet TAKE 1 TABLET BY MOUTH TWICE A DAY WITH FOOD 04/30/23   Bradd Canary, MD  Cholecalciferol (VITAMIN D3) 50 MCG (2000 UT) CAPS Take 1 capsule by mouth daily.    [provider]  diazepam (VALIUM) 5 MG tablet TAKE 1/2-2 TABLETS BY MOUTH DAILY AS NEEDED FOR ANXIETY. 01/22/23   Bradd Canary,  MD  ferrous sulfate 325 (65 FE) MG tablet Take 1 tablet (325 mg total) by mouth every other day. 06/21/22   Bradd Canary, MD  furosemide (LASIX) 40 MG tablet Take 1 tablet (40 mg total) by mouth daily. 12/12/22   Kathleene Hazel, MD  KLOR-CON M20 20 MEQ tablet TAKE 3 TABLETS (60 MEQ TOTAL) BY MOUTH 3 (THREE) TIMES DAILY. 10/06/22   Bradd Canary, MD  latanoprost (XALATAN) 0.005 % ophthalmic solution 1 drop at bedtime. 11/10/21   [provider]  losartan (COZAAR) 50 MG tablet TAKE 1 TABLET BY MOUTH EVERY DAY 08/29/22   Bradd Canary, MD  Multiple Vitamin (MULITIVITAMIN WITH MINERALS) TABS Take 1 tablet by mouth daily.    [provider]  omeprazole (PRILOSEC) 40 MG capsule Take 1 capsule (40 mg total) by mouth daily. 01/08/23   Bradd Canary, MD  Probiotic Product (PROBIOTIC DAILY PO) Take 1 tablet by mouth daily.    [provider]  Propylene Glycol (SYSTANE BALANCE OP) Place 1 drop into both eyes in the morning and at bedtime. For dry eyes.    [provider]  tamsulosin (FLOMAX) 0.4 MG CAPS capsule Take 1 capsule (0.4 mg total) by mouth daily. 02/03/21   Bruning, Ashlyn, PA-C  triamcinolone ointment (KENALOG) 0.1 % SMARTSIG:1 Topical Daily 01/01/22   [provider]  triamterene-hydrochlorothiazide (MAXZIDE-25) 37.5-25 MG tablet TAKE 1 TABLET BY MOUTH EVERY DAY 01/23/23   Bradd Canary, MD    Family History Family History  Problem Relation Age of Onset   Breast cancer Sister    Diabetes Sister    Colon cancer Brother 22       at least 75   Diabetes Brother    Cancer Brother        colon, prostate   Prostate cancer Brother    Diabetes Brother    Diabetes Sister    Cancer Sister        breast, colon, melanoma   Heart disease Sister    Hypertension Mother    Arthritis Mother    Kidney failure Sister    Colon cancer Sister    Breast cancer Sister    Kidney disease Sister        s/p transplant   Hypertension Sister    Diabetes  Sister    Stroke Father    Hypertension Father     Social History Social History   Tobacco Use   Smoking status: Never   Smokeless tobacco: Never  Vaping Use   Vaping status: Never Used  Substance Use Topics   Alcohol use: No   Drug use: No     Allergies   Latex   Review of Systems Review of Systems Per HPI  Physical Exam Triage Vital Signs ED Triage Vitals  Encounter Vitals Group     BP 05/07/23 1312 (!) 150/86     Systolic BP Percentile --      Diastolic BP Percentile --      Pulse Rate 05/07/23 1312 70     Resp 05/07/23 1312 18     Temp 05/07/23 1312 98.6 F (37 C)     Temp Source 05/07/23 1312 Oral     SpO2 05/07/23 1312 96 %     Weight --      Height --      Head Circumference --      Peak Flow --      Pain Score 05/07/23 1311 8     Pain Loc --      Pain Education --      Exclude from Growth Chart --    No data found.  Updated Vital Signs BP (!) 150/86 (BP Location: Right Arm)   Pulse 70   Temp 98.6 F (37 C) (Oral)   Resp 18   SpO2 96%    Physical Exam Vitals and nursing note reviewed.  Constitutional:      General: He is not in acute distress. Cardiovascular:     Rate and Rhythm: Normal rate and regular rhythm.     Heart sounds: Normal heart sounds.  Pulmonary:     Effort:  Pulmonary effort is normal.     Breath sounds: Normal breath sounds.  Musculoskeletal:     Lumbar back: No tenderness or bony tenderness. Positive left straight leg raise test.     Comments: No bony tenderness of hips or back. +SLR on the left. Distal sensation intact.  Skin:    General: Skin is warm and dry.     Capillary Refill: Capillary refill takes less than 2 seconds.     Comments: No rash or lesions   Neurological:     Mental Status: He is alert and oriented to person, place, and time.     Gait: Gait is intact.     Comments: No symptoms when moving from standing to sitting in clinic. Uses cane at baseline.      UC Treatments / Results  Labs (all  labs ordered are listed, but only abnormal results are displayed) Labs Reviewed - No data to display  EKG  Radiology No results found.  Procedures Procedures   Medications Ordered in UC Medications - No data to display  Initial Impression / Assessment and Plan / UC Course  I have reviewed the triage vital signs and the nursing notes.  Pertinent labs & imaging results that were available during my care of the patient were reviewed by me and considered in my medical decision making (see chart for details).  Symptoms consistent with sciatica, or similar nerve etiology Will use low dose, short course prednisone. 20 mg daily x 5 days for inflammation. No indication for xray at this time. Advised follow up with orthopedics/sports med whom he has seen in the past. Patient agreeable to plan  Final Clinical Impressions(s) / UC Diagnoses   Final diagnoses:  Sciatica, left side     Discharge Instructions      Starting tomorrow, take the prednisone 20 mg daily for 5 days in a row This should help to reduce inflammation that may be causing your symptoms Please follow up with your orthopedic or sports medicine specialist!      ED Prescriptions     Medication Sig Dispense Auth. Provider   predniSONE (DELTASONE) 20 MG tablet Take 1 tablet (20 mg total) by mouth daily with breakfast for 5 days. 5 tablet David Parsons, Lurena Joiner, PA-C      PDMP not reviewed this encounter.   Marlow Baars, [place] 05/07/23 1426

## 2023-05-08 ENCOUNTER — Encounter: Payer: Self-pay | Admitting: Family Medicine

## 2023-05-08 ENCOUNTER — Other Ambulatory Visit: Payer: Self-pay | Admitting: Family Medicine

## 2023-05-08 DIAGNOSIS — M25552 Pain in left hip: Secondary | ICD-10-CM

## 2023-05-09 ENCOUNTER — Ambulatory Visit (HOSPITAL_BASED_OUTPATIENT_CLINIC_OR_DEPARTMENT_OTHER)
Admission: RE | Admit: 2023-05-09 | Discharge: 2023-05-09 | Disposition: A | Discharge status: Home / Self Care | Payer: Medicare Other | Source: Ambulatory Visit | Attending: Family Medicine | Admitting: Family Medicine

## 2023-05-09 DIAGNOSIS — M25552 Pain in left hip: Secondary | ICD-10-CM | POA: Diagnosis not present

## 2023-05-09 DIAGNOSIS — M160 Bilateral primary osteoarthritis of hip: Secondary | ICD-10-CM | POA: Diagnosis not present

## 2023-05-15 ENCOUNTER — Ambulatory Visit (INDEPENDENT_AMBULATORY_CARE_PROVIDER_SITE_OTHER): Payer: Medicare Other | Admitting: Sports Medicine

## 2023-05-15 ENCOUNTER — Encounter: Payer: Self-pay | Admitting: Sports Medicine

## 2023-05-15 VITALS — BP 142/64 | Ht 73.0 in | Wt 264.0 lb

## 2023-05-15 DIAGNOSIS — M5416 Radiculopathy, lumbar region: Secondary | ICD-10-CM

## 2023-05-15 NOTE — Progress Notes (Signed)
Subjective:    Patient ID: David Parsons, male    DOB: 08-23-1940, 82 y.o.   MRN: [id number]  HPI chief complaint: Left hip pain  Patient is a very pleasant 82 year old male that presents today complaining of 1 week of left hip pain.  Pain is noticeable only when going from a seated to a standing position.  He endorses lateral hip pain that will shoot down the leg into the lower leg and foot.  He has a little bit of instability and weakness with this as well.  This episode will last about 10 seconds before resolving.  He denies pain with walking or standing.  No pain at night.  He recently saw his PCP and was placed on a course of oral steroids which has been helpful.  His pain is similar to some sciatica that he had when he was younger.  It was treated conservatively without surgery.  He denies pain in the groin.  X-rays of the left hip were recently done which do show some advanced degenerative changes in the hip but nothing acute.   Past medical history reviewed Medications reviewed Allergies reviewed   Review of Systems As above    Objective:   Physical Exam  Well-developed, well-nourished.  No acute distress  Left hip: There is some limited internal rotation passively but this does not reproduce pain.  Good external rotation.  There is no tenderness to palpation along the lateral hip.  Negative straight leg raise.  Good strength.  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  It sounds like his couch at home is low to the ground and I recommended that he try stacking cushions to see if that will help.  We did discuss physical therapy but since he has improved we will wait on that for now.  He will follow-up for ongoing or recalcitrant issues.  This note was dictated using Dragon naturally speaking software and may contain errors in  syntax, spelling, or content which have not been identified prior to signing this note.

## 2023-05-20 ENCOUNTER — Other Ambulatory Visit: Payer: Self-pay | Admitting: Family Medicine

## 2023-05-22 ENCOUNTER — Telehealth: Payer: Self-pay | Admitting: *Deleted

## 2023-05-22 MED ORDER — PREDNISONE 10 MG PO TABS: ORAL_TABLET | ORAL | 0 refills | Status: DC

## 2023-05-22 NOTE — Telephone Encounter (Signed)
-----   Message from Langley Adie sent at 05/22/2023  3:53 PM EDT ----- Regarding: FW: Pt cld states Hip pain has returned since completing Predisone Per Dr.Draper you can order a 6 dys refill of the Prednisone ----- Message ----- From: Carl Best Sent: 05/21/2023   4:45 PM EDT To: Ralene Cork, DO Subject: Pt cld states Hip pain has returned since co#  Patient called request Rx refill of Prednisone, advised him no Rx on file from Dr.Draper--per pt Rx given by UC provider.  Pt ask if Dr.Draper cannot refill Rx if there is any other recommendations.  --Please advise    Thx

## 2023-05-25 DIAGNOSIS — Z8546 Personal history of malignant neoplasm of prostate: Secondary | ICD-10-CM | POA: Diagnosis not present

## 2023-05-25 DIAGNOSIS — C61 Malignant neoplasm of prostate: Secondary | ICD-10-CM | POA: Diagnosis not present

## 2023-06-01 DIAGNOSIS — Z8546 Personal history of malignant neoplasm of prostate: Secondary | ICD-10-CM | POA: Diagnosis not present

## 2023-06-01 DIAGNOSIS — R351 Nocturia: Secondary | ICD-10-CM | POA: Diagnosis not present

## 2023-06-08 ENCOUNTER — Other Ambulatory Visit: Payer: Self-pay | Admitting: Cardiovascular Disease

## 2023-06-08 DIAGNOSIS — I5033 Acute on chronic diastolic (congestive) heart failure: Secondary | ICD-10-CM

## 2023-07-07 ENCOUNTER — Other Ambulatory Visit: Payer: Self-pay | Admitting: Family Medicine

## 2023-07-10 ENCOUNTER — Ambulatory Visit (INDEPENDENT_AMBULATORY_CARE_PROVIDER_SITE_OTHER): Payer: Medicare Other | Admitting: Podiatry

## 2023-07-10 DIAGNOSIS — M79674 Pain in right toe(s): Secondary | ICD-10-CM | POA: Diagnosis not present

## 2023-07-10 DIAGNOSIS — B351 Tinea unguium: Secondary | ICD-10-CM

## 2023-07-10 DIAGNOSIS — M79675 Pain in left toe(s): Secondary | ICD-10-CM | POA: Diagnosis not present

## 2023-07-15 ENCOUNTER — Encounter: Payer: Self-pay | Admitting: Podiatry

## 2023-07-15 NOTE — Assessment & Plan Note (Signed)
hgba1c acceptable, minimize simple carbs. Increase exercise as tolerated.  

## 2023-07-15 NOTE — Assessment & Plan Note (Signed)
Encourage heart healthy diet such as MIND or DASH diet, increase exercise, avoid trans fats, simple carbohydrates and processed foods, consider a krill or fish or flaxseed oil cap daily.  °

## 2023-07-15 NOTE — Progress Notes (Signed)
  Subjective:  Patient ID: David Parsons, male    DOB: 1941-08-18,  MRN: [id number]  82 y.o. male presents painful thick toenails that are difficult to trim. Pain interferes with ambulation. Aggravating factors include wearing enclosed shoe gear. Pain is relieved with periodic professional debridement.  Chief Complaint  Patient presents with   RFC    RFC   New problem(s): None   PCP is Bradd Canary, MD.  Allergies  Allergen Reactions   Latex Hives    Latex gloves     Review of Systems: Negative except as noted in the HPI.   Objective:  David Parsons is a pleasant 82 y.o. male morbidly obese in NAD.Marland Kitchen AAO x 3.  Vascular Examination: CFT <3 seconds b/l LE. Palpable DP pulse(s) b/l LE. Palpable PT pulse(s) b/l LE. Pedal hair absent. No pain with calf compression b/l. Lower extremity skin temperature gradient within normal limits. No edema noted b/l LE.  Neurological Examination: Sensation grossly intact b/l with 10 gram monofilament. Vibratory sensation intact b/l.  Dermatological Examination: Pedal skin with normal turgor, texture and tone b/l. No open wounds nor interdigital macerations noted. Toenails 1-5 b/l thick, discolored, elongated with subungual debris and pain on dorsal palpation.   Hyperkeratotic lesion(s) submet head 5 b/l.  No erythema, no edema, no drainage, no fluctuance.  Musculoskeletal Examination: Muscle strength 5/5 to b/l LE.  No pain, crepitus noted b/l. Hammertoe deformity noted 2-5 b/l. Pes planus deformity noted bilateral LE.  Radiographs: None  Last A1c:      Latest Ref Rng & Units 03/15/2023    8:48 AM 10/18/2022    7:54 AM  Hemoglobin A1C  Hemoglobin-A1c 4.6 - 6.5 % 5.1  5.3    Assessment:   1. Pain due to onychomycosis of toenails of both feet    Plan:  -Examined patient. -Continue supportive shoe gear daily. -Mycotic toenails 1-5 bilaterally were debrided in length and girth with sterile nail nippers and dremel without  incident. -As a courtesy, callus(es) submet head 5 b/l pared utilizing sterile scalpel blade without complication or incident. Total number pared=2. -Patient/POA to call should there be question/concern in the interim.  Return in about 3 months (around 10/10/2023).  Freddie Breech, DPM      [place] LOCATION: [address] [place] [address]                   Office [phone]   [facility] LOCATION: [address] [place], [place] [address] Office [phone]

## 2023-07-15 NOTE — Assessment & Plan Note (Signed)
Encouraged DASH or MIND diet, decrease po intake and increase exercise as tolerated. Needs 7-8 hours of sleep nightly. Avoid trans fats, eat small, frequent meals every 4-5 hours with lean proteins, complex carbs and healthy fats. Minimize simple carbs, high fat foods and processed foods 

## 2023-07-15 NOTE — Assessment & Plan Note (Signed)
Well controlled, no changes to meds. Encouraged heart healthy diet such as the DASH diet and exercise as tolerated.  °

## 2023-07-16 ENCOUNTER — Telehealth (INDEPENDENT_AMBULATORY_CARE_PROVIDER_SITE_OTHER): Payer: Medicare Other | Admitting: Family Medicine

## 2023-07-16 ENCOUNTER — Encounter: Payer: Self-pay | Admitting: Family Medicine

## 2023-07-16 VITALS — BP 127/72 | HR 60 | Wt 257.0 lb

## 2023-07-16 DIAGNOSIS — R739 Hyperglycemia, unspecified: Secondary | ICD-10-CM | POA: Diagnosis not present

## 2023-07-16 DIAGNOSIS — E785 Hyperlipidemia, unspecified: Secondary | ICD-10-CM | POA: Diagnosis not present

## 2023-07-16 DIAGNOSIS — I10 Essential (primary) hypertension: Secondary | ICD-10-CM

## 2023-07-16 NOTE — Progress Notes (Signed)
MyChart Phone Visit    Virtual Visit via Phone Note   This patient is at least at moderate risk for complications without adequate follow up. This format is felt to be most appropriate for this patient at this time. Physical exam was limited by quality of the audio technology used for the visit. Juanetta, CMA was able to get the patient set up on a video visit.  Patient location: Home Patient and provider in visit Provider location: Office  I discussed the limitations of evaluation and management by telemedicine and the availability of in person appointments. The patient expressed understanding and agreed to proceed.  Visit Date: 07/16/2023  Today's healthcare provider: Danise Edge, MD     Subjective:    Patient ID: David Parsons, male    DOB: 03-Dec-1940, 82 y.o.   MRN: [id number]  No chief complaint on file.   HPI Discussed the use of AI scribe software for clinical note transcription with the patient, who gave verbal consent to proceed.  History of Present Illness   The patient, with a history of prostate cancer, back and leg pain, and elevated cholesterol and glucose, presents for a routine follow-up. They deny any concerning symptoms related to their prostate cancer. Their back and leg pain have also improved significantly, and they are currently asymptomatic.  Their last blood work, done five to six months ago, showed slightly elevated cholesterol and glucose levels. Their spot glucose was 107, and their hemoglobin A1c was 5.1. They have been managing their weight well and have recently lost weight, currently weighing 257 pounds. They have also received their flu shot and COVID[facility] booster [facility]        Past Medical History:  Diagnosis Date   Aortic insufficiency     02/16/09 Chest CT:  The aortic root has a diameter measuring 4.6 cm, image 64 of the coronal series.  The ascending    thoracic aorta has a diameter of 3.8 cm, image 65 of the coronal  series.  At the level of the aortic arch    the thoracic aorta has a maximum diameter of 3.2 cm.  The descending thoracic aorta has a maximum           diameter of 3.2 cm.  There is no evidence for aortic disse         Aortic root enlargement (HCC)    The aortic root has a diameter measuring 4.6 cm, image 64 of the coronal series.  The ascending  thoracic aorta has a diameter of 3.8 cm, image 65 of the coronal series.  At the level of the aortic arch   the thoracic aorta has a maximum diameter of 3.2 cm.  The descending thoracic aorta has a maximum   diameter of 3.2 cm.  There is no evidence for aortic dissection.        CAD (coronary artery disease)    Cardiomyopathy     01/2009 - Cath - nonobs. dzs.  EF 35%.   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   Biological Bentall Aortic Root Replacement using 25 mm Edwards Magna Ease pericardial tissue valve and 28 mm Vascutek Gelweave Valsalva conduit with reimplantation of left main and right coronary artery and CABG x1 using SVG to RCA   Sleep apnea    occasionally wears CPAP at night    Past Surgical History:  Procedure Laterality Date   ADRENALECTOMY     BENTALL PROCEDURE  12/28/2011   Procedure: BENTALL PROCEDURE;  Surgeon: Purcell Nails, MD;  Location: [facility] OR;  Service: Open Heart Surgery;  Laterality: N/A;   CARDIAC CATHETERIZATION     2013   CARDIAC VALVE REPLACEMENT  12/28/2011   Bentall with tissue valve   CHOLECYSTECTOMY      COLONOSCOPY  09/25/2011   COLONOSCOPY W/ BIOPSIES AND POLYPECTOMY  11/04/2008   colon polyps, diverticulosis    CORONARY ARTERY BYPASS GRAFT  12/28/2011   Procedure: CORONARY ARTERY BYPASS GRAFTING (CABG);  Surgeon: Purcell Nails, MD;  Location: [facility] OR;  Service: Open Heart Surgery;  Laterality: N/A;  cabg x 1   LEFT AND RIGHT HEART CATHETERIZATION WITH CORONARY ANGIOGRAM N/A 11/22/2011   Procedure: LEFT AND RIGHT HEART CATHETERIZATION WITH CORONARY ANGIOGRAM;  Surgeon: Kathleene Hazel, MD;  Location: [facility] CATH LAB;  Service: Cardiovascular;  Laterality: N/A;   MULTIPLE TOOTH EXTRACTIONS     PROSTATE BIOPSY Bilateral    TEE WITHOUT CARDIOVERSION  11/22/2011   Procedure: TRANSESOPHAGEAL ECHOCARDIOGRAM (TEE);  Surgeon: Laurey Morale, MD;  Location: [facility] ENDOSCOPY;  Service: Cardiovascular;  Laterality: N/A;   UPPER GASTROINTESTINAL ENDOSCOPY  11/04/2008   w/bx, H pylori gastritis   UPPER GASTROINTESTINAL ENDOSCOPY  09/25/2011    Family History  Problem Relation Age of Onset   Breast cancer Sister    Diabetes Sister    Colon cancer Brother 51       at least 61   Diabetes Brother    Cancer Brother        colon, prostate   Prostate cancer Brother    Diabetes Brother    Diabetes Sister    Cancer Sister        breast, colon, melanoma   Heart disease Sister    Hypertension Mother    Arthritis Mother    Kidney failure Sister    Colon cancer Sister    Breast cancer Sister    Kidney disease Sister        s/p transplant   Hypertension Sister    Diabetes Sister    Stroke Father    Hypertension Father     Social History   Socioeconomic History   Marital status: Married    Spouse name: Steward Drone   Number of children: 0   Years of education: Not on file   Highest education level: Not on file  Occupational History    Employer: [employer]    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  He  lives a very sedentary lifestyle.  He is a nonsmoker.  He denies alcohol consumption.    Social Determinants of Health   Financial Resource Strain: Low Risk  (03/21/2022)   Overall Financial Resource Strain (CARDIA)    Difficulty of Paying Living Expenses: Not hard at all  Food Insecurity: No Food Insecurity (01/24/2023)   Hunger Vital Sign    Worried About Running Out of Food in the Last Year: Never true    Ran Out of Food in the Last Year: Never true  Transportation Needs: No Transportation Needs (01/24/2023)   PRAPARE - Administrator, Civil Service (Medical): No    Lack of Transportation (Non-Medical): No  Physical Activity: Insufficiently Active (03/21/2022)   Exercise Vital Sign    Days of Exercise per Week: 3 days    Minutes of Exercise per Session: 40 min  Stress: No Stress Concern Present (08/02/2021)   [employer] of Occupational Health - Occupational Stress Questionnaire    Feeling of Stress : Not at all  Social Connections: Socially Integrated (08/02/2021)   Social Connection and Isolation Panel [NHANES]    Frequency of Communication with Friends and Family: More than three times a week    Frequency of Social Gatherings with Friends and Family: Three times a week    Attends Religious Services: More than 4 times per year    Active Member of Clubs or Organizations: Yes    Attends Banker Meetings: More than 4 times per year    Marital Status: Married  Catering manager Violence: Not At Risk (08/02/2021)   Humiliation, Afraid, Rape, and Kick questionnaire    Fear of Current or Ex-Partner: No     Emotionally Abused: No    Physically Abused: No    Sexually Abused: No    Outpatient Medications Prior to Visit  Medication Sig Dispense Refill   amLODipine (NORVASC) 10 MG tablet TAKE 1 TABLET BY MOUTH EVERY DAY 90 tablet 1   aspirin EC 81 MG tablet Take 81 mg by mouth daily.     carvedilol (COREG) 25 MG tablet TAKE 1 TABLET BY MOUTH TWICE A DAY WITH FOOD 180 tablet 1   Cholecalciferol (VITAMIN D3) 50 MCG (2000 UT) CAPS Take 1 capsule by mouth daily.     diazepam (VALIUM) 5 MG tablet TAKE 1/2-2 TABLETS BY MOUTH DAILY AS NEEDED FOR ANXIETY. 10 tablet 1   ferrous sulfate 325 (65 FE) MG tablet Take 1 tablet (325 mg total) by mouth every other day.     furosemide (LASIX) 40 MG tablet TAKE 1 TABLET BY MOUTH EVERY DAY 90 tablet 2   KLOR-CON M20 20 MEQ tablet TAKE 3 TABLETS (60 MEQ TOTAL) BY MOUTH 3 (THREE) TIMES DAILY. 810 tablet 1   latanoprost (XALATAN) 0.005 % ophthalmic solution 1 drop at bedtime.     losartan (COZAAR) 50 MG tablet Take 1 tablet (50 mg total) by mouth daily. 90 tablet 1   Multiple Vitamin (MULITIVITAMIN WITH MINERALS) TABS Take 1 tablet by mouth daily.     omeprazole (PRILOSEC) 40 MG capsule TAKE 1 CAPSULE (40 MG TOTAL) BY MOUTH DAILY. 90 capsule 1   predniSONE (DELTASONE) 10 MG tablet Take by mouth with food as directed. 21 tablet 0   Probiotic Product (PROBIOTIC DAILY PO) Take 1 tablet by mouth daily.     Propylene Glycol (SYSTANE BALANCE OP) Place 1 drop  into both eyes in the morning and at bedtime. For dry eyes.     tamsulosin (FLOMAX) 0.4 MG CAPS capsule Take 1 capsule (0.4 mg total) by mouth daily. 30 capsule 3   triamcinolone ointment (KENALOG) 0.1 % SMARTSIG:1 Topical Daily     triamterene-hydrochlorothiazide (MAXZIDE-25) 37.5-25 MG tablet TAKE 1 TABLET BY MOUTH EVERY DAY 90 tablet 2   No facility-administered medications prior to visit.    Allergies  Allergen Reactions   Latex Hives    Latex gloves     Review of Systems  Constitutional:  Negative for  fever and malaise/fatigue.  HENT:  Negative for congestion.   Eyes:  Negative for blurred vision.  Respiratory:  Negative for shortness of breath.   Cardiovascular:  Negative for chest pain, palpitations and leg swelling.  Gastrointestinal:  Negative for abdominal pain, blood in stool and nausea.  Genitourinary:  Negative for dysuria and frequency.  Musculoskeletal:  Negative for falls.  Skin:  Negative for rash.  Neurological:  Negative for dizziness, loss of consciousness and headaches.  Endo/Heme/Allergies:  Negative for environmental allergies.  Psychiatric/Behavioral:  Negative for depression. The patient is not nervous/anxious.        Objective:    Physical Exam Constitutional:      General: He is not in acute distress.    Appearance: Normal appearance. He is not ill-appearing or toxic-appearing.  HENT:     Head: Normocephalic and atraumatic.     Right Ear: External ear normal.     Left Ear: External ear normal.     Nose: Nose normal.  Eyes:     General:        Right eye: No discharge.        Left eye: No discharge.  Pulmonary:     Effort: Pulmonary effort is normal.  Skin:    Findings: No rash.  Neurological:     Mental Status: He is alert and oriented to person, place, and time.  Psychiatric:        Behavior: Behavior normal.     BP 127/72   Pulse 60   Wt 257 lb (116.6 kg)   SpO2 100%   BMI 33.91 kg/m  Wt Readings from Last 3 Encounters:  07/16/23 257 lb (116.6 kg)  05/15/23 264 lb (119.7 kg)  03/15/23 264 lb 9.6 oz (120 kg)       Assessment & Plan:  Essential hypertension Assessment & Plan: Well controlled, no changes to meds. Encouraged heart healthy diet such as the DASH diet and exercise as tolerated.    Orders: -     Comprehensive metabolic panel; Future -     CBC with Differential/Platelet; Future -     TSH; Future  Hyperglycemia Assessment & Plan: hgba1c acceptable, minimize simple carbs. Increase exercise as tolerated.  Orders: -      Hemoglobin A1c; Future  Obesity, Class III, BMI 40-49.9 (morbid obesity) (HCC) Assessment & Plan: Encouraged DASH or MIND diet, decrease po intake and increase exercise as tolerated. Needs 7-8 hours of sleep nightly. Avoid trans fats, eat small, frequent meals every 4-5 hours with lean proteins, complex carbs and healthy fats. Minimize simple carbs, high fat foods and processed foods    Dyslipidemia Assessment & Plan: Encourage heart healthy diet such as MIND or DASH diet, increase exercise, avoid trans fats, simple carbohydrates and processed foods, consider a krill or fish or flaxseed oil cap daily.    Orders: -     Lipid panel; Future  Assessment and Plan    Prostate Cancer Urologist reports excellent control with PSA down to zero. No concerning symptoms reported. -Continue current management and follow-up with urologist in a year.  Hyperlipidemia and Hyperglycemia Previous labs showed slightly elevated cholesterol and glucose levels. No current symptoms of hyperglycemia. -Order comprehensive metabolic panel, lipid panel, and hemoglobin A1c. -Advise moderation in carbohydrate intake, especially during the holiday season.  Vaccinations [facility] No record of RSV vaccination. -Recommend RSV vaccination at patient's convenience, either [facility] or in clinic.  General Health Maintenance Patient reports successful weight management and is currently at 257 lbs. -Continue current lifestyle modifications. -Schedule follow-up appointment in 4-6 months. -Order labs in the next week or two to monitor cholesterol and glucose levels.         I discussed the assessment and treatment plan with the patient. The patient was provided an opportunity to ask questions and all were answered. The patient agreed with the plan and demonstrated an understanding of the instructions.   The patient was advised to call back or seek an in-person evaluation if  the symptoms worsen or if the condition fails to improve as anticipated.  Spent 25 minutes with patient in visit  Danise Edge, MD [facility] at [facility] [phone] (phone) [phone] (fax)  [facility]

## 2023-07-17 ENCOUNTER — Telehealth: Payer: Self-pay | Admitting: Family Medicine

## 2023-07-17 NOTE — Telephone Encounter (Signed)
Patient scheduled.

## 2023-07-17 NOTE — Telephone Encounter (Signed)
Steward Drone (spouse DPR Ok) called to verify when pt needs to be scheduled for a follow up. Per AVS, instructions read :  "Return in about 6 months (around 01/13/2024), or 5 to 6 months for office visit and a lab appointment for next week."  Advised her a message would be sent back to verify info before scheduling.

## 2023-07-24 ENCOUNTER — Other Ambulatory Visit (INDEPENDENT_AMBULATORY_CARE_PROVIDER_SITE_OTHER): Payer: Medicare Other

## 2023-07-24 DIAGNOSIS — E785 Hyperlipidemia, unspecified: Secondary | ICD-10-CM | POA: Diagnosis not present

## 2023-07-24 DIAGNOSIS — R739 Hyperglycemia, unspecified: Secondary | ICD-10-CM | POA: Diagnosis not present

## 2023-07-24 DIAGNOSIS — I10 Essential (primary) hypertension: Secondary | ICD-10-CM

## 2023-07-24 LAB — LIPID PANEL
Cholesterol: 163 mg/dL (ref 0–200)
HDL: 35.7 mg/dL — ABNORMAL LOW (ref >39.00)
LDL Cholesterol: 98 mg/dL (ref 0–99)
NonHDL: 127.76
Total CHOL/HDL Ratio: 5
Triglycerides: 147 mg/dL (ref 0.0–149.0)
VLDL: 29.4 mg/dL (ref 0.0–40.0)

## 2023-07-24 LAB — CBC WITH DIFFERENTIAL/PLATELET
Basophils Absolute: 0.2 10*3/uL — ABNORMAL HIGH (ref 0.0–0.1)
Basophils Relative: 3.2 % — ABNORMAL HIGH (ref 0.0–3.0)
Eosinophils Absolute: 0.3 10*3/uL (ref 0.0–0.7)
Eosinophils Relative: 5.2 % — ABNORMAL HIGH (ref 0.0–5.0)
HCT: 39 % (ref 39.0–52.0)
Hemoglobin: 13 g/dL (ref 13.0–17.0)
Lymphocytes Relative: 16.8 % (ref 12.0–46.0)
Lymphs Abs: 1 10*3/uL (ref 0.7–4.0)
MCHC: 33.4 g/dL (ref 30.0–36.0)
MCV: 89.8 fL (ref 78.0–100.0)
Monocytes Absolute: 0.6 10*3/uL (ref 0.1–1.0)
Monocytes Relative: 10.5 % (ref 3.0–12.0)
Neutro Abs: 3.8 10*3/uL (ref 1.4–7.7)
Neutrophils Relative %: 64.3 % (ref 43.0–77.0)
Platelets: 280 10*3/uL (ref 150.0–400.0)
RBC: 4.34 Mil/uL (ref 4.22–5.81)
RDW: 13.5 % (ref 11.5–15.5)
WBC: 6 10*3/uL (ref 4.0–10.5)

## 2023-07-24 LAB — COMPREHENSIVE METABOLIC PANEL
ALT: 8 U/L (ref 0–53)
AST: 13 U/L (ref 0–37)
Albumin: 3.8 g/dL (ref 3.5–5.2)
Alkaline Phosphatase: 72 U/L (ref 39–117)
BUN: 10 mg/dL (ref 6–23)
CO2: 33 meq/L — ABNORMAL HIGH (ref 19–32)
Calcium: 9.3 mg/dL (ref 8.4–10.5)
Chloride: 100 meq/L (ref 96–112)
Creatinine, Ser: 0.84 mg/dL (ref 0.40–1.50)
GFR: 81.83 mL/min (ref >60.00)
Glucose, Bld: 117 mg/dL — ABNORMAL HIGH (ref 70–99)
Potassium: 3 meq/L — ABNORMAL LOW (ref 3.5–5.1)
Sodium: 140 meq/L (ref 135–145)
Total Bilirubin: 1 mg/dL (ref 0.2–1.2)
Total Protein: 6.3 g/dL (ref 6.0–8.3)

## 2023-07-24 LAB — TSH: TSH: 1.45 u[IU]/mL (ref 0.35–5.50)

## 2023-07-24 LAB — HEMOGLOBIN A1C: Hgb A1c MFr Bld: 5.2 % (ref 4.6–6.5)

## 2023-07-25 ENCOUNTER — Other Ambulatory Visit: Payer: Self-pay | Admitting: Emergency Medicine

## 2023-07-25 DIAGNOSIS — I10 Essential (primary) hypertension: Secondary | ICD-10-CM

## 2023-07-27 ENCOUNTER — Other Ambulatory Visit (INDEPENDENT_AMBULATORY_CARE_PROVIDER_SITE_OTHER): Payer: Medicare Other

## 2023-07-27 DIAGNOSIS — I10 Essential (primary) hypertension: Secondary | ICD-10-CM

## 2023-07-27 LAB — COMPREHENSIVE METABOLIC PANEL
ALT: 9 U/L (ref 0–53)
AST: 14 U/L (ref 0–37)
Albumin: 4 g/dL (ref 3.5–5.2)
Alkaline Phosphatase: 70 U/L (ref 39–117)
BUN: 16 mg/dL (ref 6–23)
CO2: 32 meq/L (ref 19–32)
Calcium: 9.4 mg/dL (ref 8.4–10.5)
Chloride: 104 meq/L (ref 96–112)
Creatinine, Ser: 0.9 mg/dL (ref 0.40–1.50)
GFR: 80.14 mL/min (ref >60.00)
Glucose, Bld: 112 mg/dL — ABNORMAL HIGH (ref 70–99)
Potassium: 4 meq/L (ref 3.5–5.1)
Sodium: 143 meq/L (ref 135–145)
Total Bilirubin: 1.5 mg/dL — ABNORMAL HIGH (ref 0.2–1.2)
Total Protein: 6.5 g/dL (ref 6.0–8.3)

## 2023-08-01 ENCOUNTER — Other Ambulatory Visit: Payer: Self-pay | Admitting: Family Medicine

## 2023-08-11 ENCOUNTER — Other Ambulatory Visit: Payer: Self-pay | Admitting: Family Medicine

## 2023-08-11 DIAGNOSIS — F410 Panic disorder [episodic paroxysmal anxiety] without agoraphobia: Secondary | ICD-10-CM

## 2023-08-13 ENCOUNTER — Other Ambulatory Visit (HOSPITAL_COMMUNITY): Payer: Self-pay

## 2023-08-13 ENCOUNTER — Telehealth: Payer: Self-pay | Admitting: Pharmacy Technician

## 2023-08-13 NOTE — Telephone Encounter (Signed)
Pharmacy Patient Advocate Encounter   Received notification from CoverMyMeds that prior authorization for diazePAM 5MG  tablets is required/requested.   Insurance verification completed.   The patient is insured through [facility] .   Per test claim: PA required; PA submitted to above mentioned insurance via CoverMyMeds Key/confirmation #/EOC Key: [id number] Status is pending

## 2023-08-13 NOTE — Telephone Encounter (Signed)
Requesting: diazepam 5mg   Contract: None UDS: None Last Visit: 07/16/23 Next Visit: 12/24/23 Last Refill: 01/22/23 #10 and 0RF    Please Advise

## 2023-08-14 NOTE — Telephone Encounter (Signed)
Pharmacy Patient Advocate Encounter  Received notification from [facility] that Prior Authorization for DIAZEPAM 5MG  TABLETS has been APPROVED from 07/30/2023 to [facility] FURTHER NOTICE   PA #/Case ID/Reference #:  [id number]

## 2023-11-02 DIAGNOSIS — H04123 Dry eye syndrome of bilateral lacrimal glands: Secondary | ICD-10-CM | POA: Diagnosis not present

## 2023-11-02 DIAGNOSIS — H401123 Primary open-angle glaucoma, left eye, severe stage: Secondary | ICD-10-CM | POA: Diagnosis not present

## 2023-11-02 DIAGNOSIS — H524 Presbyopia: Secondary | ICD-10-CM | POA: Diagnosis not present

## 2023-11-02 DIAGNOSIS — H401111 Primary open-angle glaucoma, right eye, mild stage: Secondary | ICD-10-CM | POA: Diagnosis not present

## 2023-11-02 DIAGNOSIS — H25813 Combined forms of age-related cataract, bilateral: Secondary | ICD-10-CM | POA: Diagnosis not present

## 2023-11-04 ENCOUNTER — Other Ambulatory Visit: Payer: Self-pay | Admitting: Family Medicine

## 2023-11-10 ENCOUNTER — Other Ambulatory Visit: Payer: Self-pay | Admitting: Family Medicine

## 2023-11-14 ENCOUNTER — Ambulatory Visit (INDEPENDENT_AMBULATORY_CARE_PROVIDER_SITE_OTHER): Payer: Medicare Other | Admitting: Podiatry

## 2023-11-14 ENCOUNTER — Encounter: Payer: Self-pay | Admitting: Podiatry

## 2023-11-14 VITALS — Ht 73.0 in | Wt 257.0 lb

## 2023-11-14 DIAGNOSIS — B351 Tinea unguium: Secondary | ICD-10-CM | POA: Diagnosis not present

## 2023-11-14 DIAGNOSIS — M79674 Pain in right toe(s): Secondary | ICD-10-CM | POA: Diagnosis not present

## 2023-11-14 DIAGNOSIS — M79675 Pain in left toe(s): Secondary | ICD-10-CM | POA: Diagnosis not present

## 2023-11-18 NOTE — Progress Notes (Signed)
  Subjective:  Patient ID: David Parsons, male    DOB: Jan 04, 1941,  MRN: [id number]  David Parsons presents to clinic today for painful, elongated thickened toenails x 10 which are symptomatic when wearing enclosed shoe gear. This interferes with his/her daily activities.  Chief Complaint  Patient presents with   Nail Problem    Pt is here for [facility] PCP is Dr Abner Greenspan and LOV was 4 months ago.   New problem(s): None.   PCP is Bradd Canary, MD.  Allergies  Allergen Reactions   Latex Hives    Latex gloves     Review of Systems: Negative except as noted in the HPI.  Objective: No changes noted in today's physical examination. There were no vitals filed for this visit. David Parsons is a pleasant 83 y.o. male obese in NAD. AAO x 3.  Vascular Examination: CFT <3 seconds b/l LE. Palpable DP pulse(s) b/l LE. Palpable PT pulse(s) b/l LE. Pedal hair absent. No pain with calf compression b/l. Lower extremity skin temperature gradient within normal limits. No edema noted b/l LE.  Neurological Examination: Sensation grossly intact b/l with 10 gram monofilament. Vibratory sensation intact b/l.  Dermatological Examination: Pedal skin with normal turgor, texture and tone b/l. No open wounds nor interdigital macerations noted. Toenails 1-5 b/l thick, discolored, elongated with subungual debris and pain on dorsal palpation.   No hyperkeratotic nor porokeratotic lesions present on today's visit.   Musculoskeletal Examination: Muscle strength 5/5 to b/l LE.  No pain, crepitus noted b/l. Hammertoe deformity noted 2-5 b/l. Pes planus deformity noted bilateral LE.  Radiographs: None  Assessment/Plan: 1. Pain due to onychomycosis of toenails of both feet   Patient was evaluated and treated. All patient's and/or POA's questions/concerns addressed on today's visit. Toenails 1-5 debrided in length and girth without incident. Continue soft, supportive shoe gear daily. Report any pedal injuries to  medical professional. Call office if there are any questions/concerns. -Patient/POA to call should there be question/concern in the interim.   Return in about 3 months (around 02/14/2024).  Freddie Breech, DPM      [place] LOCATION: [address], [place] [address]                   Office [phone]   [facility] LOCATION: [address] [place], [place] [address] Office [phone]

## 2023-12-14 ENCOUNTER — Other Ambulatory Visit: Payer: Self-pay | Admitting: Family Medicine

## 2023-12-23 NOTE — Assessment & Plan Note (Signed)
 Well controlled, no changes to meds. Encouraged heart healthy diet such as the DASH diet and exercise as tolerated.

## 2023-12-23 NOTE — Assessment & Plan Note (Signed)
 Encourage heart healthy diet such as MIND or DASH diet, increase exercise, avoid trans fats, simple carbohydrates and processed foods, consider a krill or fish or flaxseed oil cap daily.

## 2023-12-23 NOTE — Assessment & Plan Note (Signed)
 No recent exacerbations.

## 2023-12-23 NOTE — Assessment & Plan Note (Signed)
 hgba1c acceptable, minimize simple carbs. Increase exercise as tolerated.

## 2023-12-24 ENCOUNTER — Encounter: Payer: Self-pay | Admitting: Family Medicine

## 2023-12-24 ENCOUNTER — Ambulatory Visit (INDEPENDENT_AMBULATORY_CARE_PROVIDER_SITE_OTHER): Payer: Medicare Other | Admitting: Family Medicine

## 2023-12-24 VITALS — BP 134/78 | HR 68 | Temp 97.8°F | Resp 16 | Ht 73.0 in | Wt 266.2 lb

## 2023-12-24 DIAGNOSIS — I10 Essential (primary) hypertension: Secondary | ICD-10-CM | POA: Diagnosis not present

## 2023-12-24 DIAGNOSIS — R739 Hyperglycemia, unspecified: Secondary | ICD-10-CM | POA: Diagnosis not present

## 2023-12-24 DIAGNOSIS — E785 Hyperlipidemia, unspecified: Secondary | ICD-10-CM

## 2023-12-24 DIAGNOSIS — Z79899 Other long term (current) drug therapy: Secondary | ICD-10-CM | POA: Diagnosis not present

## 2023-12-24 DIAGNOSIS — I509 Heart failure, unspecified: Secondary | ICD-10-CM | POA: Diagnosis not present

## 2023-12-24 LAB — CBC WITH DIFFERENTIAL/PLATELET
Basophils Absolute: 0.1 10*3/uL (ref 0.0–0.1)
Basophils Relative: 1.6 % (ref 0.0–3.0)
Eosinophils Absolute: 0.2 10*3/uL (ref 0.0–0.7)
Eosinophils Relative: 2.7 % (ref 0.0–5.0)
HCT: 40.3 % (ref 39.0–52.0)
Hemoglobin: 13.4 g/dL (ref 13.0–17.0)
Lymphocytes Relative: 16.3 % (ref 12.0–46.0)
Lymphs Abs: 1.2 10*3/uL (ref 0.7–4.0)
MCHC: 33.2 g/dL (ref 30.0–36.0)
MCV: 89.6 fl (ref 78.0–100.0)
Monocytes Absolute: 0.7 10*3/uL (ref 0.1–1.0)
Monocytes Relative: 9.4 % (ref 3.0–12.0)
Neutro Abs: 5.1 10*3/uL (ref 1.4–7.7)
Neutrophils Relative %: 70 % (ref 43.0–77.0)
Platelets: 298 10*3/uL (ref 150.0–400.0)
RBC: 4.49 Mil/uL (ref 4.22–5.81)
RDW: 13.5 % (ref 11.5–15.5)
WBC: 7.3 10*3/uL (ref 4.0–10.5)

## 2023-12-24 LAB — COMPREHENSIVE METABOLIC PANEL WITH GFR
ALT: 10 U/L (ref 0–53)
AST: 15 U/L (ref 0–37)
Albumin: 4.3 g/dL (ref 3.5–5.2)
Alkaline Phosphatase: 66 U/L (ref 39–117)
BUN: 12 mg/dL (ref 6–23)
CO2: 32 meq/L (ref 19–32)
Calcium: 9.6 mg/dL (ref 8.4–10.5)
Chloride: 99 meq/L (ref 96–112)
Creatinine, Ser: 0.97 mg/dL (ref 0.40–1.50)
GFR: 73.04 mL/min (ref >60.00)
Glucose, Bld: 101 mg/dL — ABNORMAL HIGH (ref 70–99)
Potassium: 3.4 meq/L — ABNORMAL LOW (ref 3.5–5.1)
Sodium: 140 meq/L (ref 135–145)
Total Bilirubin: 1.3 mg/dL — ABNORMAL HIGH (ref 0.2–1.2)
Total Protein: 6.9 g/dL (ref 6.0–8.3)

## 2023-12-24 LAB — LIPID PANEL
Cholesterol: 182 mg/dL (ref 0–200)
HDL: 37.1 mg/dL — ABNORMAL LOW (ref >39.00)
LDL Cholesterol: 120 mg/dL — ABNORMAL HIGH (ref 0–99)
NonHDL: 144.57
Total CHOL/HDL Ratio: 5
Triglycerides: 124 mg/dL (ref 0.0–149.0)
VLDL: 24.8 mg/dL (ref 0.0–40.0)

## 2023-12-24 LAB — TSH: TSH: 1.56 u[IU]/mL (ref 0.35–5.50)

## 2023-12-24 LAB — HEMOGLOBIN A1C: Hgb A1c MFr Bld: 5.2 % (ref 4.6–6.5)

## 2023-12-24 NOTE — Patient Instructions (Addendum)
 10 ounces of water every 1-2 hours, max of 80 ounces in a day, minimum of 60 ounces a day  RSV, Respiratory Syncitial Virus Vaccine, Arexvy vaccine at pharmacy  Prevnar 20 once at pharmacy  [facility] Astringent to itchy skin then apply vitamin E oil, lidocaine  gel

## 2023-12-24 NOTE — Progress Notes (Signed)
 Subjective:    Patient ID: David Parsons, male    DOB: 03/13/1941, 82 y.o.   MRN: [id number]  Chief Complaint  Patient presents with   Medical Management of Chronic Issues    Patient presents today for a 5 month follow-up.    HPI Discussed the use of AI scribe software for clinical note transcription with the patient, who gave verbal consent to proceed.  History of Present Illness David Parsons is an 83 year old male who presents for a routine follow-up visit.  He maintains a busy lifestyle, focusing on healthy eating and hydration. He is actively trying to manage his weight and increase water intake, acknowledging room for improvement in hydration, especially with the use of diuretics like Lasix , which he takes in the morning.  He uses diazepam  infrequently, approximately one pill every six months, with about seven pills remaining from a prescription given in December.  He experiences persistent itching and pulling sensations at a surgical site from approximately eleven years ago, describing it as nerve-related and particularly bothersome at night. He sometimes applies cold to the area for relief.  He has a history of significant weight loss, having lost over fifty pounds due to lifestyle changes, resulting in improved energy levels, breathing, and mobility. He feels he looks younger than his peers and is committed to maintaining his health.  He reports regular bowel movements and no recent emergency room visits. No current respiratory issues or other acute symptoms.    Past Medical History:  Diagnosis Date   Aortic insufficiency     02/16/09 Chest CT:  The aortic root has a diameter measuring 4.6 cm, image 64 of the coronal series.  The ascending    thoracic aorta has a diameter of 3.8 cm, image 65 of the coronal series.  At the level of the aortic arch    the thoracic aorta has a maximum diameter of 3.2 cm.  The descending thoracic aorta has a maximum           diameter of 3.2  cm.  There is no evidence for aortic disse         Aortic root enlargement (HCC)    The aortic root has a diameter measuring 4.6 cm, image 64 of the coronal series.  The ascending  thoracic aorta has a diameter of 3.8 cm, image 65 of the coronal series.  At the level of the aortic arch   the thoracic aorta has a maximum diameter of 3.2 cm.  The descending thoracic aorta has a maximum   diameter of 3.2 cm.  There is no evidence for aortic dissection.        CAD (coronary artery disease)    Cardiomyopathy     01/2009 - Cath - nonobs. dzs.  EF 35%.   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   Biological Bentall Aortic Root Replacement using 25 mm [employer]  pericardial tissue valve and 28 mm Vascutek Gelweave Valsalva conduit with reimplantation of left main and right coronary artery and CABG x1 using SVG to RCA   Sleep apnea    occasionally wears CPAP at night    Past Surgical History:  Procedure Laterality Date   ADRENALECTOMY     BENTALL PROCEDURE  12/28/2011   Procedure: BENTALL PROCEDURE;  Surgeon: Gardenia Jump, MD;  Location: [facility] OR;  Service: Open Heart Surgery;  Laterality: N/A;   CARDIAC CATHETERIZATION     2013   CARDIAC VALVE REPLACEMENT  12/28/2011   Bentall with tissue valve   CHOLECYSTECTOMY     COLONOSCOPY  09/25/2011   COLONOSCOPY W/ BIOPSIES AND POLYPECTOMY  11/04/2008   colon polyps, diverticulosis    CORONARY ARTERY BYPASS GRAFT  12/28/2011   Procedure: CORONARY  ARTERY BYPASS GRAFTING (CABG);  Surgeon: Gardenia Jump, MD;  Location: [facility] OR;  Service: Open Heart Surgery;  Laterality: N/A;  cabg x 1   LEFT AND RIGHT HEART CATHETERIZATION WITH CORONARY ANGIOGRAM N/A 11/22/2011   Procedure: LEFT AND RIGHT HEART CATHETERIZATION WITH CORONARY ANGIOGRAM;  Surgeon: Odie Benne, MD;  Location: [facility] CATH LAB;  Service: Cardiovascular;  Laterality: N/A;   MULTIPLE TOOTH EXTRACTIONS     PROSTATE BIOPSY Bilateral    TEE WITHOUT CARDIOVERSION  11/22/2011   Procedure: TRANSESOPHAGEAL ECHOCARDIOGRAM (TEE);  Surgeon: Darlis Eisenmenger, MD;  Location: [facility] ENDOSCOPY;  Service: Cardiovascular;  Laterality: N/A;   UPPER GASTROINTESTINAL ENDOSCOPY  11/04/2008   w/bx, H pylori gastritis   UPPER GASTROINTESTINAL ENDOSCOPY  09/25/2011    Family History  Problem Relation Age of Onset   Breast cancer Sister    Diabetes Sister    Colon cancer Brother 22       at least 87   Diabetes Brother    Cancer Brother        colon, prostate   Prostate cancer Brother    Diabetes Brother    Diabetes Sister    Cancer Sister        breast, colon, melanoma   Heart disease Sister    Hypertension Mother    Arthritis Mother    Kidney failure Sister    Colon cancer Sister    Breast cancer Sister    Kidney disease Sister        s/p transplant   Hypertension Sister    Diabetes Sister    Stroke Father    Hypertension Father     Social History   Socioeconomic History   Marital status: Married    Spouse name: Cornelius Dill   Number of children: 0   Years of education: Not on file   Highest education level: Not on file  Occupational History    Employer: [employer]    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.  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  He  lives a very sedentary lifestyle.  He is a nonsmoker.  He denies alcohol consumption.    Social Drivers  of Health   Financial Resource Strain: Low Risk  (03/21/2022)   Overall Financial Resource Strain (CARDIA)    Difficulty of Paying Living Expenses: Not hard at all  Food Insecurity: No Food Insecurity (01/24/2023)   Hunger Vital Sign    Worried About Running Out of Food in the Last Year: Never true    Ran Out of Food in the Last Year: Never true  Transportation Needs: No Transportation Needs (01/24/2023)   PRAPARE - Administrator, Civil Service (Medical): No    Lack of Transportation (Non-Medical): No  Physical Activity: Insufficiently Active (03/21/2022)   Exercise Vital Sign    Days of Exercise per Week: 3 days    Minutes of Exercise per Session: 40 min  Stress: No Stress Concern Present (08/02/2021)   [employer] of Occupational Health - Occupational Stress Questionnaire    Feeling of Stress : Not at all  Social Connections: Socially Integrated (08/02/2021)   Social Connection and Isolation Panel [NHANES]    Frequency of Communication with Friends and Family: More than three times a week    Frequency of Social Gatherings with Friends and Family: Three times a week    Attends Religious Services: More than 4 times per year    Active Member of Clubs or Organizations: Yes    Attends Banker Meetings: More than 4 times per year    Marital Status: Married  Catering manager Violence: Not At Risk (08/02/2021)   Humiliation, Afraid, Rape, and Kick questionnaire    Fear of Current or Ex-Partner: No    Emotionally Abused: No    Physically Abused: No    Sexually Abused: No    Outpatient Medications Prior to Visit  Medication Sig Dispense Refill   amLODipine  (NORVASC ) 10 MG tablet  TAKE 1 TABLET BY MOUTH EVERY DAY 90 tablet 1   aspirin  EC 81 MG tablet Take 81 mg by mouth daily.     carvedilol  (COREG ) 25 MG tablet TAKE 1 TABLET BY MOUTH TWICE A DAY WITH FOOD 180 tablet 1   Cholecalciferol (VITAMIN D3) 50 MCG (2000 UT) CAPS Take 1 capsule by mouth daily.     diazepam  (VALIUM ) 5 MG tablet TAKE 1/2-2 TABLETS BY MOUTH DAILY AS NEEDED FOR ANXIETY. 10 tablet 0   ferrous sulfate  325 (65 FE) MG tablet Take 1 tablet (325 mg total) by mouth every other day.     furosemide  (LASIX ) 40 MG tablet TAKE 1 TABLET BY MOUTH EVERY DAY 90 tablet 2   KLOR-CON  M20 20 MEQ tablet TAKE 3 TABLETS (60 MEQ TOTAL) BY MOUTH 3 (THREE) TIMES DAILY. 810 tablet 1   latanoprost (XALATAN) 0.005 % ophthalmic solution 1 drop at bedtime.     losartan  (COZAAR ) 50 MG tablet TAKE 1 TABLET BY MOUTH EVERY DAY 90 tablet 1   Multiple Vitamin (MULITIVITAMIN WITH MINERALS) TABS Take 1 tablet by mouth daily.     omeprazole  (PRILOSEC) 40 MG capsule TAKE 1 CAPSULE (40 MG TOTAL) BY MOUTH DAILY. 90 capsule 1   predniSONE  (DELTASONE ) 10 MG tablet Take by mouth with food as directed. 21 tablet 0   Probiotic Product (PROBIOTIC DAILY PO) Take 1 tablet by mouth daily.     Propylene Glycol (SYSTANE BALANCE OP) Place 1 drop into both eyes in the morning and at bedtime. For dry eyes.     tamsulosin  (FLOMAX ) 0.4 MG CAPS capsule Take 1 capsule (0.4 mg total) by mouth daily. 30 capsule 3   triamcinolone  ointment (KENALOG ) 0.1 %  SMARTSIG:1 Topical Daily     triamterene -hydrochlorothiazide  (MAXZIDE-25) 37.5-25 MG tablet TAKE 1 TABLET BY MOUTH EVERY DAY 90 tablet 2   No facility-administered medications prior to visit.    Allergies  Allergen Reactions   Latex Hives    Latex gloves     Review of Systems  Constitutional:  Negative for fever and malaise/fatigue.  HENT:  Negative for congestion.   Eyes:  Negative for blurred vision.  Respiratory:  Negative for shortness of breath.   Cardiovascular:  Negative for chest pain,  palpitations and leg swelling.  Gastrointestinal:  Negative for abdominal pain, blood in stool and nausea.  Genitourinary:  Negative for dysuria and frequency.  Musculoskeletal:  Negative for falls.  Skin:  Positive for itching. Negative for rash.  Neurological:  Negative for dizziness, loss of consciousness and headaches.  Endo/Heme/Allergies:  Negative for environmental allergies.  Psychiatric/Behavioral:  Negative for depression. The patient has insomnia. The patient is not nervous/anxious.        Objective:    Physical Exam Vitals reviewed.  Constitutional:      Appearance: Normal appearance. He is obese. He is not ill-appearing.  HENT:     Head: Normocephalic and atraumatic.     Nose: Nose normal.  Eyes:     Conjunctiva/sclera: Conjunctivae normal.  Cardiovascular:     Rate and Rhythm: Normal rate.     Pulses: Normal pulses.     Heart sounds: Normal heart sounds. No murmur heard. Pulmonary:     Effort: Pulmonary effort is normal.     Breath sounds: Normal breath sounds. No wheezing.  Abdominal:     Palpations: Abdomen is soft. There is no mass.     Tenderness: There is no abdominal tenderness.  Musculoskeletal:     Cervical back: Normal range of motion.     Right lower leg: No edema.     Left lower leg: No edema.  Skin:    General: Skin is warm and dry.  Neurological:     General: No focal deficit present.     Mental Status: He is alert and oriented to person, place, and time.  Psychiatric:        Mood and Affect: Mood normal.     BP 134/78   Pulse 68   Temp 97.8 F (36.6 C)   Resp 16   Ht 6\' 1"  (1.854 m)   Wt 266 lb 3.2 oz (120.7 kg)   SpO2 97%   BMI 35.12 kg/m  Wt Readings from Last 3 Encounters:  12/24/23 266 lb 3.2 oz (120.7 kg)  11/14/23 257 lb (116.6 kg)  07/16/23 257 lb (116.6 kg)    Diabetic Foot Exam - Simple   No data filed    Lab Results  Component Value Date   WBC 7.3 12/24/2023   HGB 13.4 12/24/2023   HCT 40.3 12/24/2023   PLT  298.0 12/24/2023   GLUCOSE 101 (H) 12/24/2023   CHOL 182 12/24/2023   TRIG 124.0 12/24/2023   HDL 37.10 (L) 12/24/2023   LDLCALC 120 (H) 12/24/2023   ALT 10 12/24/2023   AST 15 12/24/2023   NA 140 12/24/2023   K 3.4 (L) 12/24/2023   CL 99 12/24/2023   CREATININE 0.97 12/24/2023   BUN 12 12/24/2023   CO2 32 12/24/2023   TSH 1.56 12/24/2023   PSA 23.37 (H) 05/10/2020   INR 1.21 01/02/2012   HGBA1C 5.2 12/24/2023    Lab Results  Component Value Date   TSH 1.56 12/24/2023  Lab Results  Component Value Date   WBC 7.3 12/24/2023   HGB 13.4 12/24/2023   HCT 40.3 12/24/2023   MCV 89.6 12/24/2023   PLT 298.0 12/24/2023   Lab Results  Component Value Date   NA 140 12/24/2023   K 3.4 (L) 12/24/2023   CO2 32 12/24/2023   GLUCOSE 101 (H) 12/24/2023   BUN 12 12/24/2023   CREATININE 0.97 12/24/2023   BILITOT 1.3 (H) 12/24/2023   ALKPHOS 66 12/24/2023   AST 15 12/24/2023   ALT 10 12/24/2023   PROT 6.9 12/24/2023   ALBUMIN  4.3 12/24/2023   CALCIUM  9.6 12/24/2023   GFR 73.04 12/24/2023   Lab Results  Component Value Date   CHOL 182 12/24/2023   Lab Results  Component Value Date   HDL 37.10 (L) 12/24/2023   Lab Results  Component Value Date   LDLCALC 120 (H) 12/24/2023   Lab Results  Component Value Date   TRIG 124.0 12/24/2023   Lab Results  Component Value Date   CHOLHDL 5 12/24/2023   Lab Results  Component Value Date   HGBA1C 5.2 12/24/2023       Assessment & Plan:  Dyslipidemia Assessment & Plan: Encourage heart healthy diet such as MIND or DASH diet, increase exercise, avoid trans fats, simple carbohydrates and processed foods, consider a krill or fish or flaxseed oil cap daily.    Orders: -     Lipid panel  Congestive heart failure, unspecified HF chronicity, unspecified heart failure type [facility]) Assessment & Plan: No recent exacerbations.    Essential hypertension Assessment & Plan: Well controlled, no changes to meds. Encouraged heart  healthy diet such as the DASH diet and exercise as tolerated.    Orders: -     Comprehensive metabolic panel with GFR -     CBC with Differential/Platelet -     TSH  Hyperglycemia Assessment & Plan: hgba1c acceptable, minimize simple carbs. Increase exercise as tolerated.  Orders: -     Hemoglobin A1c  High risk medication use -     Drug Monitoring Panel [phone] , Urine    Assessment and Plan Assessment & Plan Wellness Visit Emphasized hydration due to increased dehydration risk in elderly. Reviewed medications for diuretic effects. Discussed RSV and Prevnar 20 vaccines for those over 75. - Order lab work. - Recommend hydration as discussed. - Consider RSV and Prevnar 20 vaccines at pharmacy. - No diazepam  refill needed.  Hypertension Management ongoing with Amlodipine , Carvedilol , Losartan , and Triamterene -hydrochlorothiazide . Emphasized hydration to prevent dehydration from diuretics. - Continue current antihypertensive regimen. - Emphasize hydration to balance diuretic effects.  Obesity, Class III, BMI 40-49.9 Ongoing management with reported weight loss, increased energy, and improved mobility. Discussed avoiding caloric intake from beverages. - Continue current weight management strategies. - Encourage non-caloric beverages.     Randie Bustle, MD

## 2023-12-26 LAB — DRUG MONITORING PANEL 376104, URINE
Amphetamines: NEGATIVE ng/mL (ref <500)
Barbiturates: NEGATIVE ng/mL (ref <300)
Benzodiazepines: NEGATIVE ng/mL (ref <100)
Cocaine Metabolite: NEGATIVE ng/mL (ref <150)
Desmethyltramadol: NEGATIVE ng/mL (ref <100)
Opiates: NEGATIVE ng/mL (ref <100)
Oxycodone: NEGATIVE ng/mL (ref <100)
Tramadol: NEGATIVE ng/mL (ref <100)

## 2023-12-26 LAB — DM TEMPLATE

## 2024-01-06 ENCOUNTER — Other Ambulatory Visit: Payer: Self-pay | Admitting: Family Medicine

## 2024-01-21 ENCOUNTER — Ambulatory Visit: Payer: Self-pay | Admitting: Family Medicine

## 2024-01-21 ENCOUNTER — Other Ambulatory Visit (INDEPENDENT_AMBULATORY_CARE_PROVIDER_SITE_OTHER)

## 2024-01-21 DIAGNOSIS — E876 Hypokalemia: Secondary | ICD-10-CM

## 2024-01-21 LAB — COMPREHENSIVE METABOLIC PANEL WITH GFR
ALT: 10 U/L (ref 0–53)
AST: 14 U/L (ref 0–37)
Albumin: 4.3 g/dL (ref 3.5–5.2)
Alkaline Phosphatase: 66 U/L (ref 39–117)
BUN: 14 mg/dL (ref 6–23)
CO2: 29 meq/L (ref 19–32)
Calcium: 9.7 mg/dL (ref 8.4–10.5)
Chloride: 104 meq/L (ref 96–112)
Creatinine, Ser: 0.96 mg/dL (ref 0.40–1.50)
GFR: 73.92 mL/min (ref >60.00)
Glucose, Bld: 110 mg/dL — ABNORMAL HIGH (ref 70–99)
Potassium: 4 meq/L (ref 3.5–5.1)
Sodium: 140 meq/L (ref 135–145)
Total Bilirubin: 1.4 mg/dL — ABNORMAL HIGH (ref 0.2–1.2)
Total Protein: 6.8 g/dL (ref 6.0–8.3)

## 2024-01-29 ENCOUNTER — Ambulatory Visit (INDEPENDENT_AMBULATORY_CARE_PROVIDER_SITE_OTHER)

## 2024-01-29 VITALS — Ht 73.0 in | Wt 259.0 lb

## 2024-01-29 DIAGNOSIS — Z0000 Encounter for general adult medical examination without abnormal findings: Secondary | ICD-10-CM | POA: Diagnosis not present

## 2024-01-29 NOTE — Progress Notes (Signed)
 Subjective:   David Parsons is a 83 y.o. who presents for a Medicare Wellness preventive visit.  As a reminder, Annual Wellness Visits don't include a physical exam, and some assessments may be limited, especially if this visit is performed virtually. We may recommend an in-person follow-up visit with your provider if needed.  Visit Complete: Virtual I connected with  David Parsons on 01/29/24 by a audio enabled telemedicine application and verified that I am speaking with the correct person using two identifiers.  Patient Location: Home  Provider Location: Home Office  I discussed the limitations of evaluation and management by telemedicine. The patient expressed understanding and agreed to proceed.  Vital Signs: Because this visit was a virtual/telehealth visit, some criteria may be missing or patient reported. Any vitals not documented were not able to be obtained and vitals that have been documented are patient reported.    Persons Participating in Visit: Patient.  AWV Questionnaire: No: Patient Medicare AWV questionnaire was not completed prior to this visit.  Cardiac Risk Factors include: advanced age (>61men, >41 women);male gender;hypertension     Objective:     Today's Vitals   01/29/24 0847  Weight: 259 lb (117.5 kg)  Height: 6\' 1"  (1.854 m)   Body mass index is 34.17 kg/m.     01/29/2024    8:55 AM 01/24/2023    9:00 AM 01/17/2022    8:41 AM 06/06/2021    4:10 PM 02/03/2021    1:56 PM 08/18/2019    8:10 AM 08/12/2018    9:36 AM  Advanced Directives  Does Patient Have a Medical Advance Directive? No No No No No No Yes  Type of Tax inspector;Living will  Copy of Healthcare Power of Attorney in Chart?       Yes - validated most recent copy scanned in chart (See row information)  Would patient like information on creating a medical advance directive? No - Patient declined No - Patient declined  No - Patient declined  No  - Patient declined     Current Medications (verified) Outpatient Encounter Medications as of 01/29/2024  Medication Sig   amLODipine  (NORVASC ) 10 MG tablet TAKE 1 TABLET BY MOUTH EVERY DAY   aspirin  EC 81 MG tablet Take 81 mg by mouth daily.   carvedilol  (COREG ) 25 MG tablet TAKE 1 TABLET BY MOUTH TWICE A DAY WITH FOOD   Cholecalciferol (VITAMIN D3) 50 MCG (2000 UT) CAPS Take 1 capsule by mouth daily.   diazepam  (VALIUM ) 5 MG tablet TAKE 1/2-2 TABLETS BY MOUTH DAILY AS NEEDED FOR ANXIETY.   ferrous sulfate  325 (65 FE) MG tablet Take 1 tablet (325 mg total) by mouth every other day.   furosemide  (LASIX ) 40 MG tablet TAKE 1 TABLET BY MOUTH EVERY DAY   KLOR-CON  M20 20 MEQ tablet TAKE 3 TABLETS (60 MEQ TOTAL) BY MOUTH 3 (THREE) TIMES DAILY.   latanoprost (XALATAN) 0.005 % ophthalmic solution 1 drop at bedtime.   losartan  (COZAAR ) 50 MG tablet TAKE 1 TABLET BY MOUTH EVERY DAY   Multiple Vitamin (MULITIVITAMIN WITH MINERALS) TABS Take 1 tablet by mouth daily.   omeprazole  (PRILOSEC) 40 MG capsule TAKE 1 CAPSULE (40 MG TOTAL) BY MOUTH DAILY.   predniSONE  (DELTASONE ) 10 MG tablet Take by mouth with food as directed.   Probiotic Product (PROBIOTIC DAILY PO) Take 1 tablet by mouth daily.   Propylene Glycol (SYSTANE BALANCE OP) Place 1 drop into both  eyes in the morning and at bedtime. For dry eyes.   tamsulosin  (FLOMAX ) 0.4 MG CAPS capsule Take 1 capsule (0.4 mg total) by mouth daily.   triamcinolone  ointment (KENALOG ) 0.1 % SMARTSIG:1 Topical Daily   triamterene -hydrochlorothiazide  (MAXZIDE-25) 37.5-25 MG tablet TAKE 1 TABLET BY MOUTH EVERY DAY   No facility-administered encounter medications on file as of 01/29/2024.    Allergies (verified) Latex   History: Past Medical History:  Diagnosis Date   Aortic insufficiency     02/16/09 Chest CT:  The aortic root has a diameter measuring 4.6 cm, image 64 of the coronal series.  The ascending    thoracic aorta has a diameter of 3.8 cm, image 65 of  the coronal series.  At the level of the aortic arch    the thoracic aorta has a maximum diameter of 3.2 cm.  The descending thoracic aorta has a maximum           diameter of 3.2 cm.  There is no evidence for aortic disse         Aortic root enlargement (HCC)    The aortic root has a diameter measuring 4.6 cm, image 64 of the coronal series.  The ascending  thoracic aorta has a diameter of 3.8 cm, image 65 of the coronal series.  At the level of the aortic arch   the thoracic aorta has a maximum diameter of 3.2 cm.  The descending thoracic aorta has a maximum   diameter of 3.2 cm.  There is no evidence for aortic dissection.        CAD (coronary artery disease)    Cardiomyopathy     01/2009 - Cath - nonobs. dzs.  EF 35%.   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   Biological Bentall Aortic Root Replacement using 25 mm Edwards Magna Ease pericardial tissue valve and 28 mm Vascutek Gelweave Valsalva conduit with reimplantation of left main and right coronary artery and CABG x1 using SVG to RCA   Sleep apnea    occasionally wears CPAP at night   Past Surgical History:  Procedure Laterality Date   ADRENALECTOMY     BENTALL PROCEDURE  12/28/2011   Procedure: BENTALL PROCEDURE;  Surgeon: Gardenia Jump, MD;  Location: [facility] OR;  Service: Open Heart Surgery;  Laterality: N/A;   CARDIAC CATHETERIZATION     2013   CARDIAC VALVE REPLACEMENT  12/28/2011   Bentall with tissue valve   CHOLECYSTECTOMY      COLONOSCOPY  09/25/2011   COLONOSCOPY W/ BIOPSIES AND POLYPECTOMY  11/04/2008   colon polyps, diverticulosis    CORONARY ARTERY BYPASS GRAFT  12/28/2011   Procedure: CORONARY ARTERY BYPASS GRAFTING (CABG);  Surgeon: Gardenia Jump, MD;  Location: [facility] OR;  Service: Open Heart Surgery;  Laterality: N/A;  cabg x 1   LEFT AND RIGHT HEART CATHETERIZATION WITH CORONARY ANGIOGRAM N/A 11/22/2011   Procedure: LEFT AND RIGHT HEART  CATHETERIZATION WITH CORONARY ANGIOGRAM;  Surgeon: Odie Benne, MD;  Location: [facility] CATH LAB;  Service: Cardiovascular;  Laterality: N/A;   MULTIPLE TOOTH EXTRACTIONS     PROSTATE BIOPSY Bilateral    TEE WITHOUT CARDIOVERSION  11/22/2011   Procedure: TRANSESOPHAGEAL ECHOCARDIOGRAM (TEE);  Surgeon: Darlis Eisenmenger, MD;  Location: [facility] ENDOSCOPY;  Service: Cardiovascular;  Laterality: N/A;   UPPER GASTROINTESTINAL ENDOSCOPY  11/04/2008   w/bx, H pylori gastritis   UPPER GASTROINTESTINAL ENDOSCOPY  09/25/2011   Family History  Problem Relation Age of Onset   Breast cancer Sister    Diabetes Sister    Colon cancer Brother 50       at least 59   Diabetes Brother    Cancer Brother        colon, prostate   Prostate cancer Brother    Diabetes Brother    Diabetes Sister    Cancer Sister        breast, colon, melanoma   Heart disease Sister    Hypertension Mother    Arthritis Mother    Kidney failure Sister    Colon cancer Sister    Breast cancer Sister    Kidney disease Sister        s/p transplant   Hypertension Sister    Diabetes Sister    Stroke Father    Hypertension Father    Social History   Socioeconomic History   Marital status: Married    Spouse name: Cornelius Dill   Number of children: 0   Years of education: Not on file   Highest education level: Not on file  Occupational History    Employer: [employer]    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  He  lives a very sedentary lifestyle.  He is a nonsmoker.  He denies alcohol consumption.    Social Drivers of Corporate investment banker Strain: Low Risk  (01/29/2024)   Overall Financial Resource Strain (CARDIA)    Difficulty of Paying Living Expenses: Not hard at all  Food Insecurity: No Food Insecurity (01/29/2024)   Hunger Vital Sign    Worried About Running Out of Food in the Last Year: Never true    Ran Out of Food in the Last Year: Never true  Transportation Needs: No Transportation Needs (01/29/2024)   PRAPARE - Administrator, Civil Service (Medical): No    Lack of Transportation (Non-Medical): No  Physical Activity: Sufficiently Active (01/29/2024)   Exercise Vital Sign    Days of Exercise per Week: 5 days    Minutes of Exercise per Session: 30 min  Stress: No Stress Concern Present (01/29/2024)   [employer] of Occupational Health - Occupational Stress Questionnaire    Feeling of Stress : Not at all  Social Connections: Socially Integrated (01/29/2024)   Social Connection and Isolation Panel [NHANES]    Frequency of Communication with Friends and Family: More than three times a week  Frequency of Social Gatherings with Friends and Family: More than three times a week    Attends Religious Services: More than 4 times per year    Active Member of [employer] or Organizations: Yes    Attends Engineer, structural: More than 4 times per year    Marital Status: Married    Tobacco Counseling Counseling given: Not Answered    Clinical Intake:  Pre-visit preparation completed: Yes  Pain : No/denies pain      BMI - recorded: 34.17 Nutritional Status: BMI > 30  Obese Nutritional Risks: None Diabetes: No  Lab Results  Component Value Date   HGBA1C 5.2 12/24/2023   HGBA1C 5.2 07/24/2023   HGBA1C 5.1 03/15/2023     How often do you need to have someone help you when you read instructions, pamphlets, or other written materials from your doctor or pharmacy?: 1 - Never  Interpreter Needed?: No  Information entered by :: Farris Hong LPN   Activities of Daily Living     01/29/2024    8:54 AM  In your present state of health, do you have any difficulty performing the following activities:  Hearing? 0  Vision? 0  Difficulty concentrating or making decisions? 0  Walking or climbing stairs? 0  Dressing or bathing? 0  Doing errands, shopping? 0  Preparing Food and eating ? N  Using the Toilet? N  In the past six months, have you accidently leaked urine? N  Do you have problems with loss of bowel control? N  Managing your Medications? N  Managing your Finances? N  Housekeeping or managing your Housekeeping? N    Patient Care Team: Neda Balk, MD as PCP - General (Family Medicine) Odie Benne, MD as PCP - Cardiology (Cardiology) Odie Benne, MD as Consulting Physician (Cardiology) Kenney Peacemaker, MD as Consulting Physician (Gastroenterology) Eduardo Grade, MD as Consulting Physician (Dermatology) Corie Diamond, MD as Consulting Physician (Ophthalmology) Annamarie Kid, MD as Consulting Physician (Urology) Ruffin Cotton, DPM as Consulting Physician (Podiatry) Cecilie Coffee, RPH-CPP (Pharmacist) Trent Frizzle, MD as Consulting Physician (Urology) Debbie Fails, Laura Polio, NP as Nurse Practitioner (Hematology and Oncology) Neda Balk, RN as Registered Nurse Kenith Payer, MD as Consulting Physician (Radiation Oncology)   I have updated your Care Teams any recent Medical Services you may have received from other providers in the  past year.     Assessment:    This is a routine wellness examination for [employer].  Hearing/Vision screen Hearing Screening - Comments:: Denies hearing difficulties   Vision Screening - Comments:: Wears rx glasses - up to date with routine eye exams with  [facility]   Goals Addressed               This Visit's Progress     Remain Active (pt-stated)        Keep myself in good shape.       Depression Screen      01/29/2024    8:52 AM 03/15/2023    8:27 AM 01/24/2023    9:03 AM 11/07/2022    9:03 AM 07/06/2022   11:39 AM 05/04/2022    1:25 PM 01/17/2022    8:45 AM  PHQ 2/9 Scores  PHQ - 2 Score 0 0 0 0 0 0 0  PHQ- 9 Score    0  0     Fall Risk     01/29/2024    8:54 AM 03/15/2023    8:27 AM 01/24/2023  9:02 AM 11/07/2022    9:03 AM 07/06/2022   11:38 AM  Fall Risk   Falls in the past year? 0 0 0 0 0  Number falls in past yr: 0 0 0 0 0  Injury with Fall? 0 0 0 0 0  Risk for fall due to : No Fall Risks  No Fall Risks  No Fall Risks  Follow up Falls evaluation completed Falls evaluation completed Falls evaluation completed Falls evaluation completed Falls evaluation completed    MEDICARE RISK AT HOME:  Medicare Risk at Home Any stairs in or around the home?: No If so, are there any without handrails?: No Home free of loose throw rugs in walkways, pet beds, electrical cords, etc?: Yes Adequate lighting in your home to reduce risk of falls?: Yes Life alert?: No Use of a cane, walker or w/c?: No Grab bars in the bathroom?: Yes Shower chair or bench in shower?: Yes Elevated toilet seat or a handicapped toilet?: No  TIMED UP AND GO:  Was the test performed?  No  Cognitive Function: 6CIT completed    08/09/2017    9:35 AM 08/01/2016   11:54 AM  MMSE - Mini Mental State Exam  Orientation to time 5 5  Orientation to Place 4 5  Registration 3 3  Attention/ Calculation 5 3  Recall 2 2  Language- name 2 objects 2 2  Language- repeat 1 1  Language- follow  3 step command 3 3  Language- read & follow direction 1 1  Write a sentence 1 1  Copy design 1 1  Total score 28 27        01/29/2024    8:55 AM 01/24/2023    9:10 AM 08/18/2019    8:15 AM  6CIT Screen  What Year? 0 points 0 points 0 points  What month? 0 points 0 points 0 points  What time? 0 points 0 points 0 points  Count back from 20 0 points 0 points 0 points  Months in reverse 0 points 0 points 0 points  Repeat phrase 0 points 4 points 0 points  Total Score 0 points 4 points 0 points    Immunizations Immunization History  Administered Date(s) Administered   Fluad Quad(high Dose 65+) 04/22/2019, 05/07/2020, 05/10/2021   Influenza Split 05/15/2011, 05/22/2012   Influenza Whole 06/07/2009, 05/11/2010   Influenza, High Dose Seasonal PF 04/22/2015, 05/07/2017, 05/14/2018, 04/11/2022   Influenza,inj,Quad PF,6+ Mos 05/09/2013, 04/20/2014   Influenza-Unspecified 05/17/2016   Moderna Covid-19 Vaccine Bivalent Booster 13yrs & up 07/01/2021   Moderna Sars-Covid-2 Vaccination 09/20/2019, 10/28/2019, 07/06/2020   Pfizer(Comirnaty)Fall Seasonal Vaccine 12 years and older 04/28/2023   Pneumococcal Conjugate-13 05/09/2013   Pneumococcal Polysaccharide-23 04/16/2006, 04/22/2015   Td 04/14/2008   Tdap 05/01/2022   Unspecified SARS-COV-2 Vaccination 04/28/2023   Zoster Recombinant(Shingrix) 11/15/2021, 04/11/2022    Screening Tests Health Maintenance  Topic Date Due   COVID-19 Vaccine (6 - Moderna risk 2024-25 season) 08/20/2024 (Originally 10/26/2023)   INFLUENZA VACCINE  03/21/2024   Medicare Annual Wellness (AWV)  01/28/2025   DTaP/Tdap/Td (3 - Td or Tdap) 05/01/2032   Pneumonia Vaccine 104+ Years old  Completed   Zoster Vaccines- Shingrix  Completed   HPV VACCINES  Aged Out   Meningococcal B Vaccine  Aged Out   Hepatitis C Screening  Discontinued    Health Maintenance  There are no preventive care reminders to display for this patient. Health Maintenance Items  Addressed:   Additional Screening:  Vision Screening: Recommended annual  ophthalmology exams for early detection of glaucoma and other disorders of the eye. Would you like a referral to an eye doctor? No    Dental Screening: Recommended annual dental exams for proper oral hygiene  Community Resource Referral / Chronic Care Management: CRR required this visit?  No   CCM required this visit?  No   Plan:    I have personally reviewed and noted the following in the patient's chart:   Medical and social history Use of alcohol, tobacco or illicit drugs  Current medications and supplements including opioid prescriptions. Patient is not currently taking opioid prescriptions. Functional ability and status Nutritional status Physical activity Advanced directives List of other physicians Hospitalizations, surgeries, and ER visits in previous 12 months Vitals Screenings to include cognitive, depression, and falls Referrals and appointments  In addition, I have reviewed and discussed with patient certain preventive protocols, quality metrics, and best practice recommendations. A written personalized care plan for preventive services as well as general preventive health recommendations were provided to patient.   Dewayne Ford, LPN   1/61/0960   After Visit Summary: (MyChart) Due to this being a telephonic visit, the after visit summary with patients personalized plan was offered to patient via MyChart   Notes: Nothing significant to report at this time.

## 2024-01-29 NOTE — Patient Instructions (Addendum)
 [name] , Thank you for taking time out of your busy schedule to complete your Annual Wellness Visit with me. I enjoyed our conversation and look forward to speaking with you again next year. I, as well as your care team,  appreciate your ongoing commitment to your health goals. Please review the following plan we discussed and let me know if I can assist you in the future. Your Game plan/ To Do List    Referrals: If you haven't heard from the office you've been referred to, please reach out to them at the phone provided.   Follow up Visits: Next Medicare AWV with our clinical staff: 02/03/25 @ 8:50a   Have you seen your provider in the last 6 months (3 months if uncontrolled diabetes)?  Next Office Visit with your provider: 07/01/24 9:40a  Clinician Recommendations:  Aim for 30 minutes of exercise or brisk walking, 6-8 glasses of water, and 5 servings of fruits and vegetables each day.       This is a list of the screening recommended for you and due dates:  Health Maintenance  Topic Date Due   COVID-19 Vaccine (6 - Moderna risk 2024-25 season) 08/20/2024*   Flu Shot  03/21/2024   Medicare Annual Wellness Visit  01/28/2025   DTaP/Tdap/Td vaccine (3 - Td or Tdap) 05/01/2032   Pneumonia Vaccine  Completed   Zoster (Shingles) Vaccine  Completed   HPV Vaccine  Aged Out   Meningitis B Vaccine  Aged Out   Hepatitis C Screening  Discontinued  *Topic was postponed. The date shown is not the original due date.    Advanced directives: (Declined) Advance directive discussed with you today. Even though you declined this today, please call our office should you change your mind, and we can give you the proper paperwork for you to fill out. Advance Care Planning is important because it:  [x]  Makes sure you receive the medical care that is consistent with your values, goals, and preferences  [x]  It provides guidance to your family and loved ones and reduces their decisional burden about whether  or not they are making the right decisions based on your wishes.  Follow the link provided in your after visit summary or read over the paperwork we have mailed to you to help you started getting your Advance Directives in place. If you need assistance in completing these, please reach out to us  so that we can help you!  See attachments for Preventive Care and Fall Prevention Tips.

## 2024-02-02 ENCOUNTER — Other Ambulatory Visit: Payer: Self-pay | Admitting: Family Medicine

## 2024-02-14 ENCOUNTER — Telehealth: Payer: Self-pay | Admitting: Cardiovascular Disease

## 2024-02-14 NOTE — Telephone Encounter (Signed)
 Pt dropped off Handicap sticker paperwork that he wants filled out.   Location: Dr. Verlin mailbox

## 2024-02-15 ENCOUNTER — Encounter: Payer: Self-pay | Admitting: Family Medicine

## 2024-02-17 ENCOUNTER — Other Ambulatory Visit: Payer: Self-pay | Admitting: Family Medicine

## 2024-02-17 DIAGNOSIS — L989 Disorder of the skin and subcutaneous tissue, unspecified: Secondary | ICD-10-CM

## 2024-02-25 DIAGNOSIS — Z8546 Personal history of malignant neoplasm of prostate: Secondary | ICD-10-CM | POA: Diagnosis not present

## 2024-02-27 ENCOUNTER — Ambulatory Visit (INDEPENDENT_AMBULATORY_CARE_PROVIDER_SITE_OTHER): Admitting: Podiatry

## 2024-02-27 ENCOUNTER — Encounter: Payer: Self-pay | Admitting: Podiatry

## 2024-02-27 DIAGNOSIS — M79674 Pain in right toe(s): Secondary | ICD-10-CM | POA: Diagnosis not present

## 2024-02-27 DIAGNOSIS — B351 Tinea unguium: Secondary | ICD-10-CM | POA: Diagnosis not present

## 2024-02-27 DIAGNOSIS — M79675 Pain in left toe(s): Secondary | ICD-10-CM

## 2024-02-27 NOTE — Progress Notes (Signed)
  Subjective:  Patient ID: David Parsons, male    DOB: 07-Nov-1940,  MRN: [id number]  83 y.o. male presents painful thick toenails that are difficult to trim. Pain interferes with ambulation. Aggravating factors include wearing enclosed shoe gear. Pain is relieved with periodic professional debridement. Chief Complaint  Patient presents with   RFC    RFC non diabetic. Toenail trim.  LOV with PCP 12/24/23.   New problem(s): None   PCP is Domenica Harlene LABOR, MD.  Allergies  Allergen Reactions   Latex Hives    Latex gloves     Review of Systems: Negative except as noted in the HPI.   Objective:  David Parsons is a pleasant 83 y.o. male obese in NAD. AAO x 3.  Vascular Examination: Vascular status intact b/l with palpable pedal pulses. CFT immediate b/l. Pedal hair present. No edema. No pain with calf compression b/l. Skin temperature gradient WNL b/l. No varicosities noted. No cyanosis or clubbing noted.  Neurological Examination: Sensation grossly intact b/l with 10 gram monofilament. Vibratory sensation intact b/l.  Dermatological Examination: Pedal skin with normal turgor, texture and tone b/l. No open wounds nor interdigital macerations noted. Toenails 1-5 b/l thick, discolored, elongated with subungual debris and pain on dorsal palpation. No hyperkeratotic lesions noted b/l.   Musculoskeletal Examination: Muscle strength 5/5 to b/l LE.  No pain, crepitus noted b/l. HAV with bunion deformity noted b/l LE. Hammertoe(s) 2-5 b/l. Patient ambulates independent of any assistive aids.   Radiographs: None  Last A1c:      Latest Ref Rng & Units 12/24/2023   10:54 AM 07/24/2023   11:07 AM 03/15/2023    8:48 AM  Hemoglobin A1C  Hemoglobin-A1c 4.6 - 6.5 % 5.2  5.2  5.1    Assessment:   1. Pain due to onychomycosis of toenails of both feet    Plan:  Consent given for treatment. Patient examined. All patient's and/or POA's questions/concerns addressed on today's visit. Mycotic  toenails 1-5 debrided in length and girth without incident. Continue soft, supportive shoe gear daily. Report any pedal injuries to medical professional. Call office if there are any quesitons/concerns.  Return in about 3 months (around 05/29/2024).  David Parsons, DPM      [facility] LOCATION: [address] [place] [address]                   Office [phone]   [facility] LOCATION: [address] [place], [place] [address] Office [phone]

## 2024-02-29 NOTE — Telephone Encounter (Signed)
 Handicap form completed and signed by Dr. Verlin.  Called patient and left VM (self-identified vm) that I will place the signed application at the front desk in 5th floor lobby for pick up.

## 2024-02-29 NOTE — Telephone Encounter (Signed)
 Pt would like the handicap form be mailed to his address if not please give the pt a call

## 2024-02-29 NOTE — Telephone Encounter (Signed)
 Form placed in the outgoing mail box.

## 2024-03-03 DIAGNOSIS — Z8546 Personal history of malignant neoplasm of prostate: Secondary | ICD-10-CM | POA: Diagnosis not present

## 2024-03-03 DIAGNOSIS — N401 Enlarged prostate with lower urinary tract symptoms: Secondary | ICD-10-CM | POA: Diagnosis not present

## 2024-03-03 DIAGNOSIS — R351 Nocturia: Secondary | ICD-10-CM | POA: Diagnosis not present

## 2024-03-12 DIAGNOSIS — L820 Inflamed seborrheic keratosis: Secondary | ICD-10-CM | POA: Diagnosis not present

## 2024-03-12 DIAGNOSIS — L209 Atopic dermatitis, unspecified: Secondary | ICD-10-CM | POA: Diagnosis not present

## 2024-03-13 ENCOUNTER — Other Ambulatory Visit: Payer: Self-pay

## 2024-03-13 DIAGNOSIS — I5033 Acute on chronic diastolic (congestive) heart failure: Secondary | ICD-10-CM

## 2024-03-13 MED ORDER — FUROSEMIDE 40 MG PO TABS: 40.0000 mg | ORAL_TABLET | Freq: Every day | ORAL | 0 refills | Status: DC

## 2024-03-26 NOTE — Progress Notes (Unsigned)
 No chief complaint on file.  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 He was referred in May 2010 for SOB, abdominal swelling and LE edema. He was diagnosed with a non-ischemic CM, moderate AI, non-obstructive CAD and a dilated aortic root. His aortic root continued to enlarge. Cardiac cath March 2013 showed enlarged root, no obstructive disease in the left system. I could not engage the RCA secondary to the abnormal takeoff with the enlarged aortic root. CTA was performed and showed dilated aortic root with patent RCA. He underwent aortic root replacement, aortic valve replacement and SVG to RCA on 12/28/11.  Echo August 2020 with LVEF=55-60%. Severe LVH.  AVR working well. He was diagnosed with prostate cancer in 2022 treated with radiation therapy. Echo April 2023 with LVEF=50-55%. LVH. AVR working well.   She is here today for follow up. The patient denies any chest pain, dyspnea, palpitations, lower extremity edema, orthopnea, PND, dizziness, near syncope or syncope.    Primary Care Physician: Domenica Harlene LABOR, MD  Past Medical History:  Diagnosis Date   Aortic insufficiency     02/16/09 Chest CT:  The aortic root has a diameter measuring 4.6 cm, image 64 of the coronal series.  The ascending    thoracic aorta has a diameter of 3.8 cm, image 65 of the coronal series.  At the level of the aortic arch    the thoracic aorta has a maximum diameter of 3.2 cm.  The descending thoracic aorta has a maximum           diameter of 3.2 cm.  There is no evidence for aortic disse         Aortic root enlargement (HCC)    The aortic root has a diameter measuring 4.6 cm, image 64 of the coronal series.  The ascending  thoracic aorta  has a diameter of 3.8 cm, image 65 of the coronal series.  At the level of the aortic arch   the thoracic aorta has a maximum diameter of 3.2 cm.  The descending thoracic aorta has a maximum   diameter of 3.2 cm.  There is no evidence for aortic dissection.        CAD (coronary artery disease)    Cardiomyopathy     01/2009 - Cath - nonobs. dzs.  EF 35%.   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   Biological Bentall Aortic Root Replacement using 25 mm Edwards Magna Ease pericardial tissue valve and 28 mm Vascutek Gelweave Valsalva conduit with reimplantation of left main and right coronary artery and CABG x1 using SVG to RCA   Sleep apnea    occasionally  wears CPAP at night    Past Surgical History:  Procedure Laterality Date   ADRENALECTOMY     BENTALL PROCEDURE  12/28/2011   Procedure: BENTALL PROCEDURE;  Surgeon: Sudie VEAR Laine, MD;  Location: [facility] OR;  Service: Open Heart Surgery;  Laterality: N/A;   CARDIAC CATHETERIZATION     2013   CARDIAC VALVE REPLACEMENT  12/28/2011   Bentall with tissue valve   CHOLECYSTECTOMY     COLONOSCOPY  09/25/2011   COLONOSCOPY W/ BIOPSIES AND POLYPECTOMY  11/04/2008   colon polyps, diverticulosis    CORONARY ARTERY BYPASS GRAFT  12/28/2011   Procedure: CORONARY ARTERY BYPASS GRAFTING (CABG);  Surgeon: Sudie VEAR Laine, MD;  Location: [facility] OR;  Service: Open Heart Surgery;  Laterality: N/A;  cabg x 1   LEFT AND RIGHT HEART CATHETERIZATION WITH CORONARY  ANGIOGRAM N/A 11/22/2011   Procedure: LEFT AND RIGHT HEART CATHETERIZATION WITH CORONARY ANGIOGRAM;  Surgeon: Lonni JONETTA Cash, MD;  Location: [facility] CATH LAB;  Service: Cardiovascular;  Laterality: N/A;   MULTIPLE TOOTH EXTRACTIONS     PROSTATE BIOPSY Bilateral    TEE WITHOUT CARDIOVERSION  11/22/2011   Procedure: TRANSESOPHAGEAL ECHOCARDIOGRAM (TEE);  Surgeon: Ezra GORMAN Shuck, MD;  Location: [facility] ENDOSCOPY;  Service: Cardiovascular;  Laterality: N/A;   UPPER GASTROINTESTINAL ENDOSCOPY  11/04/2008   w/bx, H pylori gastritis   UPPER GASTROINTESTINAL ENDOSCOPY  09/25/2011    Current Outpatient Medications  Medication Sig Dispense Refill   amLODipine  (NORVASC ) 10 MG tablet TAKE 1 TABLET BY MOUTH EVERY DAY 90 tablet 1   aspirin  EC 81 MG tablet Take 81 mg by mouth daily.     carvedilol  (COREG ) 25 MG tablet TAKE 1 TABLET BY MOUTH TWICE A DAY WITH FOOD 180 tablet 1   Cholecalciferol (VITAMIN D3) 50 MCG (2000 UT) CAPS Take 1 capsule by mouth daily.     diazepam  (VALIUM ) 5 MG tablet TAKE 1/2-2 TABLETS BY MOUTH DAILY AS NEEDED FOR ANXIETY. 10 tablet 0   ferrous sulfate  325 (65 FE) MG tablet Take 1 tablet (325 mg total) by mouth every other day.     furosemide  (LASIX ) 40 MG tablet Take 1 tablet (40 mg total) by mouth daily. 90 tablet 0   latanoprost (XALATAN) 0.005 % ophthalmic solution 1 drop at bedtime.     losartan  (COZAAR ) 50 MG tablet TAKE 1 TABLET BY MOUTH EVERY DAY 90 tablet 1   Multiple Vitamin (MULITIVITAMIN WITH MINERALS) TABS Take 1 tablet by mouth daily.     omeprazole  (PRILOSEC) 40 MG capsule TAKE 1 CAPSULE (40 MG TOTAL) BY MOUTH DAILY. 90 capsule 1   potassium chloride  SA (KLOR-CON  M20) 20 MEQ tablet Take 3 tablets (60 mEq total) by mouth 3 (three) times daily. 810 tablet 1   predniSONE  (DELTASONE ) 10 MG tablet Take by mouth with food as directed. 21 tablet 0   Probiotic Product (PROBIOTIC DAILY PO) Take 1 tablet by mouth daily.     Propylene Glycol (SYSTANE BALANCE OP) Place 1 drop  into both eyes in the morning and at bedtime. For dry eyes.     tamsulosin  (FLOMAX ) 0.4 MG CAPS capsule Take 1 capsule (0.4 mg total) by mouth daily. 30 capsule 3   triamcinolone  ointment (KENALOG ) 0.1 % SMARTSIG:1 Topical Daily     triamterene -hydrochlorothiazide  (MAXZIDE-25) 37.5-25 MG tablet TAKE 1 TABLET BY MOUTH EVERY DAY 90 tablet 2   No current facility-administered medications for this visit.    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  He  lives a very sedentary lifestyle.  He is a nonsmoker.  He denies alcohol consumption.    Social Drivers of Corporate investment banker Strain: Low Risk  (01/29/2024)   Overall Financial Resource Strain (CARDIA)    Difficulty of Paying Living Expenses: Not hard at all  Food Insecurity: No Food Insecurity (01/29/2024)   Hunger Vital Sign    Worried About Running Out of Food in the Last Year: Never true    Ran Out of Food in the Last Year: Never true  Transportation Needs: No Transportation Needs (01/29/2024)   PRAPARE - Administrator, Civil Service (Medical): No    Lack of Transportation (Non-Medical): No  Physical Activity: Sufficiently Active (01/29/2024)   Exercise Vital Sign    Days of  Exercise per Week: 5 days    Minutes of Exercise per Session: 30 min  Stress: No Stress Concern Present (01/29/2024)   [employer] of Occupational Health - Occupational Stress Questionnaire    Feeling of Stress : Not at all  Social Connections: Socially Integrated (01/29/2024)   Social Connection and Isolation Panel    Frequency of Communication with Friends and Family: More than three times a week    Frequency of Social Gatherings with Friends and Family: More than three times a week    Attends Religious Services: More than 4 times per year    Active Member of [employer] or Organizations: Yes    Attends Engineer, structural: More than 4 times per year    Marital Status: Married  Catering manager Violence: Not At Risk (01/29/2024)   Humiliation, Afraid, Rape, and Kick questionnaire    Fear of Current or Ex-Partner: No    Emotionally Abused: No    Physically Abused: No    Sexually Abused: No    Family History  Problem Relation Age of Onset   Breast cancer Sister    Diabetes Sister    Colon cancer Brother 24       at least 65   Diabetes Brother    Cancer Brother        colon, prostate   Prostate cancer Brother    Diabetes Brother    Diabetes Sister    Cancer Sister        breast, colon, melanoma   Heart disease Sister    Hypertension Mother    Arthritis Mother    Kidney failure Sister    Colon cancer Sister    Breast cancer Sister    Kidney disease Sister        s/p transplant   Hypertension Sister  Diabetes Sister    Stroke Father    Hypertension Father     Review of Systems:  As stated in the HPI and otherwise negative.   There were no vitals taken for this visit.  Physical Examination:  General: Well developed, well nourished, NAD  HEENT: OP clear, mucus membranes moist  SKIN: warm, dry. No rashes. Neuro: No focal deficits  Musculoskeletal: Muscle strength 5/5 all ext  Psychiatric: Mood and affect normal  Neck: No JVD, no carotid bruits, no  thyromegaly, no lymphadenopathy.  Lungs:Clear bilaterally, no wheezes, rhonci, crackles Cardiovascular: Regular rate and rhythm. No murmurs, gallops or rubs. Abdomen:Soft. Bowel sounds present. Non-tender.  Extremities: No lower extremity edema. Pulses are 2 + in the bilateral DP/PT.  EKG:  EKG is *** ordered today. The ekg ordered today demonstrates   Recent Labs: 12/24/2023: Hemoglobin 13.4; Platelets 298.0; TSH 1.56 01/21/2024: ALT 10; BUN 14; Creatinine, Ser 0.96; Potassium 4.0; Sodium 140   Lipid Panel    Component Value Date/Time   CHOL 182 12/24/2023 1054   TRIG 124.0 12/24/2023 1054   HDL 37.10 (L) 12/24/2023 1054   CHOLHDL 5 12/24/2023 1054   VLDL 24.8 12/24/2023 1054   LDLCALC 120 (H) 12/24/2023 1054     Wt Readings from Last 3 Encounters:  01/29/24 259 lb (117.5 kg)  12/24/23 266 lb 3.2 oz (120.7 kg)  11/14/23 257 lb (116.6 kg)    Assessment and Plan:   1. CAD s/p CABG without angina: He had a single vessel bypass to RCA at the time of his root replacement and AVR. No significant coronary atherosclerosis noted prior to his surgery. No chest pain. Continue ASA and beta blocker. He has not been on a statin. (no CAD noted on pre surgery cath).     2. Chronic diastolic CHF:  No volume overload on exam. Continue Lasix .   3. HYPERTENSION: BP is controlled. No changes  4. S/P aortic valve replacement and aortoplasty: LV function normal by echo April 2023. AVR working well with mild expected gradient across valve. He will continue using SBE prophylaxis prior to dental procedures.   Labs/ tests ordered today include:  No orders of the defined types were placed in this encounter.  Disposition:   F/U with me in 12 months  Signed, Lonni Cash, MD 03/26/2024 2:29 PM    [facility] [address] [place], [place], [place]  [address] Phone: [phone]; Fax: [phone]

## 2024-03-27 ENCOUNTER — Ambulatory Visit: Attending: Cardiovascular Disease | Admitting: Cardiovascular Disease

## 2024-03-27 ENCOUNTER — Encounter: Payer: Self-pay | Admitting: Cardiovascular Disease

## 2024-03-27 VITALS — BP 126/75 | HR 68 | Resp 14 | Ht 73.0 in | Wt 264.2 lb

## 2024-03-27 DIAGNOSIS — I2510 Atherosclerotic heart disease of native coronary artery without angina pectoris: Secondary | ICD-10-CM | POA: Insufficient documentation

## 2024-03-27 DIAGNOSIS — I359 Nonrheumatic aortic valve disorder, unspecified: Secondary | ICD-10-CM | POA: Insufficient documentation

## 2024-03-27 DIAGNOSIS — I10 Essential (primary) hypertension: Secondary | ICD-10-CM | POA: Diagnosis not present

## 2024-03-27 DIAGNOSIS — I5032 Chronic diastolic (congestive) heart failure: Secondary | ICD-10-CM | POA: Insufficient documentation

## 2024-03-27 NOTE — Patient Instructions (Signed)
 Medication Instructions:  No changes *If you need a refill on your cardiac medications before your next appointment, please call your pharmacy*  Lab Work: none   Testing/Procedures: none  Follow-Up: At [facility], you and your health needs are our priority.  As part of our continuing mission to provide you with exceptional heart care, our providers are all part of one team.  This team includes your primary Cardiologist (physician) and Advanced Practice Providers or APPs (Physician Assistants and Nurse Practitioners) who all work together to provide you with the care you need, when you need it.  Your next appointment:   12 month(s)  Provider:   Antoinette Batman, MD

## 2024-04-30 DIAGNOSIS — Z23 Encounter for immunization: Secondary | ICD-10-CM | POA: Diagnosis not present

## 2024-05-02 ENCOUNTER — Other Ambulatory Visit: Payer: Self-pay | Admitting: Family Medicine

## 2024-05-07 DIAGNOSIS — H401123 Primary open-angle glaucoma, left eye, severe stage: Secondary | ICD-10-CM | POA: Diagnosis not present

## 2024-05-07 DIAGNOSIS — H401111 Primary open-angle glaucoma, right eye, mild stage: Secondary | ICD-10-CM | POA: Diagnosis not present

## 2024-05-12 ENCOUNTER — Other Ambulatory Visit: Payer: Self-pay | Admitting: Family Medicine

## 2024-05-15 ENCOUNTER — Other Ambulatory Visit: Payer: Self-pay | Admitting: Family Medicine

## 2024-05-15 DIAGNOSIS — F410 Panic disorder [episodic paroxysmal anxiety] without agoraphobia: Secondary | ICD-10-CM

## 2024-05-15 NOTE — Telephone Encounter (Signed)
 Copied from CRM #[id number]. Topic: Clinical - Medication Refill >> May 15, 2024 11:51 AM Rea C wrote: Medication: diazepam  (VALIUM ) 5 MG tablet  Has the patient contacted their pharmacy? Yes, pharmacy stated they did not receive refill request.    This is the patient's preferred pharmacy:  [phone] - [place], [place] - [address] [place] [address] Phone: [phone] Fax: [phone]  Is this the correct pharmacy for this prescription? Yes If no, delete pharmacy and type the correct one.   Has the prescription been filled recently? No  Is the patient out of the medication? Yes  Has the patient been seen for an appointment in the last year OR does the patient have an upcoming appointment? Yes  Can we respond through MyChart? Yes  Agent: Please be advised that Rx refills may take up to 3 business days. We ask that you follow-up with your pharmacy.

## 2024-05-16 MED ORDER — DIAZEPAM 5 MG PO TABS: ORAL_TABLET | ORAL | 0 refills | Status: DC

## 2024-05-16 NOTE — Telephone Encounter (Signed)
 Requesting: diazepam  5mg   Contract: 12/24/23 UDS: 12/24/23 Last Visit: 12/24/23 Next Visit: 07/01/24 w/ [place] Last Refill: 08/13/23 #10 and 0RF   Please Advise

## 2024-05-25 ENCOUNTER — Encounter: Payer: Self-pay | Admitting: Family Medicine

## 2024-05-26 ENCOUNTER — Ambulatory Visit: Payer: Self-pay

## 2024-05-26 NOTE — Telephone Encounter (Signed)
 FYI Only or Action Required?: FYI only for provider.  Patient was last seen in primary care on 12/24/2023 by Domenica Harlene LABOR, MD.  Called Nurse Triage reporting Dizziness.  Symptoms began several months ago.  Interventions attempted: Rest, hydration, or home remedies.  Symptoms are: gradually worsening.  Triage Disposition: See PCP When Office is Open (Within 3 Days)  Patient/caregiver understands and will follow disposition?: Yes  Copied from CRM #[id number]. Topic: Clinical - Red Word Triage >> May 26, 2024  1:44 PM David Parsons wrote: Red Word that prompted transfer to Nurse Triage: Patient spouse called and said he's been feeling very dizzy of and on, reached out to provider on MyChart and was advised to call and schedule. Reason for Disposition  [1] MILD dizziness (e.g., vertigo; walking normally) AND [2] has NOT been evaluated by doctor (or NP/PA) for this  Answer Assessment - Initial Assessment Questions 1. DESCRIPTION: Describe your dizziness.     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 VERTIGO: Do you feel like either you or the room is spinning or tilting?     No  3. LIGHTHEADED: Do you feel lightheaded? (e.g., somewhat faint, woozy, weak upon standing)     Feels woozy and almost like an anxiety attack  4. SEVERITY: How bad is it?  Can you walk?     Can still walk  5. ONSET:  When did the dizziness begin?     Started 2 months  6. AGGRAVATING FACTORS: Does anything make it worse? (e.g., standing, change in head position)     No aggravating factors  7. CAUSE: What do you think is causing the dizziness?     Unsure of cause  8. RECURRENT SYMPTOM: Have you had dizziness before? If Yes, ask: When was the last time? What happened that time?     Yes, happened a long time ago but went away on its  9. OTHER SYMPTOMS: Do you have any other symptoms? (e.g., earache, headache, numbness, tinnitus, vomiting, weakness)      No  Protocols used: Dizziness - Vertigo-A-AH

## 2024-05-26 NOTE — Telephone Encounter (Signed)
 Appt scheduled

## 2024-05-27 ENCOUNTER — Ambulatory Visit (INDEPENDENT_AMBULATORY_CARE_PROVIDER_SITE_OTHER): Admitting: Student

## 2024-05-27 ENCOUNTER — Encounter: Payer: Self-pay | Admitting: Student

## 2024-05-27 VITALS — BP 107/69 | HR 77 | Temp 97.6°F | Resp 12 | Ht 73.0 in | Wt 260.4 lb

## 2024-05-27 DIAGNOSIS — I5033 Acute on chronic diastolic (congestive) heart failure: Secondary | ICD-10-CM | POA: Diagnosis not present

## 2024-05-27 DIAGNOSIS — I10 Essential (primary) hypertension: Secondary | ICD-10-CM

## 2024-05-27 DIAGNOSIS — R42 Dizziness and giddiness: Secondary | ICD-10-CM

## 2024-05-27 NOTE — Progress Notes (Signed)
 No chief complaint on file.  History of Present Illness David Parsons is an 83 year old male with a history of heart valve replacement who presents with dizziness.  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 The dizziness is brief, lasting a few seconds, without shortness of breath, CP, palpations,. It sometimes occurs postprandially. He notes lower than usual blood pressure. He takes Lasix  40 mg daily and other diuretics, leading to frequent urination. He acknowledges low water intake. He takes potassium supplements three times a day. He urinates frequently, up to five times before arriving at the clinic, and sometimes experiences weight gain if he misses a dose of Lasix . No recent infections or head colds. No palpitations or chest pain. He mentions past dizziness with seasonal changes. Denies syncope, spinning sensation, HA, recent illness, changes in PO intake, or vision changes.  PMHx- HTN, OSA, Aortic valve disease- AVR, DM, CHF, CAD s/p CABG  Follows with cardiology Echo April 2023 with LVEF=50-55%. LVH. AVR working well.  Las echo 2023- WNL  CHF Takes Lasix  40 mg daily,  HTN  triamterene -hydrochlorothiazide - 37.5-25, losartan  50 mg daily, carvedilol  25 mg BID,amlodipine  10 mg daily, hydrochlorothiazide     Past Medical History:  Diagnosis Date   Aortic insufficiency     02/16/09 Chest CT:  The aortic root has a diameter measuring 4.6 cm, image 64 of the coronal series.  The ascending    thoracic aorta has a diameter of 3.8 cm, image 65 of the coronal series.  At the level of the aortic arch    the thoracic aorta has a maximum diameter of 3.2 cm.  The descending thoracic aorta has a maximum           diameter of 3.2 cm.  There is no evidence for aortic disse         Aortic root enlargement    The aortic root has a diameter measuring 4.6 cm, image 64 of the coronal series.  The  ascending  thoracic aorta has a diameter of 3.8 cm, image 65 of the coronal series.  At the level of the aortic arch   the thoracic aorta has a maximum diameter of 3.2 cm.  The descending thoracic aorta has a maximum   diameter of 3.2 cm.  There is no evidence for aortic dissection.        CAD (coronary artery disease)    Cardiomyopathy     01/2009 - Cath - nonobs. dzs.  EF 35%.   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   Biological Bentall Aortic Root Replacement using 25 mm Edwards Magna Ease pericardial tissue valve and 28 mm Vascutek Gelweave Valsalva conduit with reimplantation of left main and right coronary artery and CABG x1 using SVG to RCA   Sleep apnea    occasionally wears CPAP at night  Family History  Problem Relation Age of Onset   Breast cancer Sister    Diabetes Sister    Colon cancer Brother 51       at least 58   Diabetes Brother    Cancer Brother        colon, prostate   Prostate cancer Brother    Diabetes Brother    Diabetes Sister    Cancer Sister        breast, colon, melanoma   Heart disease Sister    Hypertension Mother    Arthritis Mother    Kidney failure Sister    Colon cancer Sister    Breast cancer Sister    Kidney disease Sister        s/p transplant   Hypertension Sister    Diabetes Sister    Stroke Father    Hypertension Father     Allergies as of 05/27/2024       Reactions   Latex Hives   Latex gloves        Medication List         Accurate as of May 27, 2024 11:59 PM. If you have any questions, ask your nurse or doctor.          STOP taking these medications    predniSONE  10 MG tablet Commonly known as: DELTASONE  Stopped by: Harlene LITTIE Jolly       TAKE these medications    amLODipine  10 MG tablet Commonly known as: NORVASC  Take 1 tablet (10 mg total) by mouth daily.   aspirin  EC 81 MG tablet Take 81 mg by mouth daily.   carvedilol  25 MG tablet Commonly known as: COREG  Take 1 tablet (25 mg total) by mouth 2 (two) times daily with a meal.   diazepam  5 MG tablet Commonly known as: VALIUM  TAKE 1/2-2 TABLETS BY MOUTH DAILY AS NEEDED FOR ANXIETY.   ferrous sulfate  325 (65 FE) MG tablet Take 1 tablet (325 mg total) by mouth every other day.   furosemide  40 MG tablet Commonly known as: LASIX  Take 1 tablet (40 mg total) by mouth daily.   latanoprost 0.005 % ophthalmic solution Commonly known as: XALATAN 1 drop at bedtime.   losartan  50 MG tablet Commonly known as: COZAAR  TAKE 1 TABLET BY MOUTH EVERY DAY   multivitamin with minerals Tabs tablet Take 1 tablet by mouth daily.   omeprazole  40 MG capsule Commonly known as: PRILOSEC TAKE 1 CAPSULE (40 MG TOTAL) BY MOUTH DAILY.   potassium chloride  SA 20 MEQ tablet Commonly known as: Klor-Con  M20 Take 3 tablets (60 mEq total) by mouth 3 (three) times daily.   PROBIOTIC DAILY PO Take 1 tablet by mouth daily.   SYSTANE BALANCE OP Place 1 drop into both eyes in the morning and at bedtime. For dry eyes.   tamsulosin  0.4 MG Caps capsule Commonly known as: FLOMAX  Take 1 capsule (0.4 mg total) by mouth daily.   triamcinolone  ointment 0.1 % Commonly known as: KENALOG  SMARTSIG:1 Topical Daily   triamterene -hydrochlorothiazide  37.5-25 MG tablet Commonly known as: MAXZIDE-25 TAKE 1 TABLET BY MOUTH EVERY DAY   vitamin D3 50 MCG (2000 UT) Caps Take 1 capsule by mouth daily.        BP 107/69 (BP Location: Left Arm, Patient  Position: Sitting, Cuff Size: Large)   Pulse 77   Temp 97.6 F (36.4 C) (Oral)   Resp 12   Ht 6' 1 (1.854 m)   Wt 260 lb 6.4 oz (118.1 kg)  SpO2 97%   BMI 34.36 kg/m  General: Awake, alert, appears stated age Eyes: PERRLA, EOMi Ears: Patent, TM's neg b/l Heart: RRR, no murmurs, no carotid bruits Lungs: CTAB, no accessory muscle use MSK: 5/5 strength throughout, gait normal Neuro: No cerebellar signs, patellar reflex WNL Psych: Age appropriate judgment and insight, normal mood and affect  Dizziness - Plan: CBC, Basic Metabolic Panel (BMET), Comp Met (CMET), TSH, Magnesium   Acute on chronic diastolic CHF (congestive heart failure), NYHA class 1 (HCC) - Plan: TSH  Essential hypertension    Dizziness  Intermittent, brief dizziness likely due to dehydration. Blood pressure drops from 120/80 mmHg supine to 107/70 mmHg standing. Differential includes dehydration, medication effects, and orthostatic hypotension. - Increase water intake; Pt admits to not drinking enough water daily - Rise slowly from sitting or lying positions, Pt educated on orthostatic hypotension. - Update lab work to r/o anemia, thyroid , or electrolyte issues - If symptoms persist, consider reducing Lasix  dosage to 20 mg daily and decreasing potassium supplements, consult with Cardiology.  -If symptoms persist, RTC   Heart failure with aortic valve replacement  Well-controlled blood pressure and heart rate. Lasix  contributing to frequent urination and potential dehydration. - Continue current medications. - Monitor for signs of fluid retention, such as weight gain or swelling. - Report significant weight gain or shortness of breath   Orders as above. F/u prn. Pt voiced understanding and agreement to the plan.  Harlene LITTIE Jolly, DNP, AGNP-C 05/28/24 12:26 PM     Addendum: Both a BMP and CMP were ordered in error. Only the CMP should be utilized and coded for this note and visit.- JLY 05/29/2024

## 2024-05-27 NOTE — Patient Instructions (Signed)
2

## 2024-05-28 ENCOUNTER — Ambulatory Visit: Payer: Self-pay | Admitting: Student

## 2024-05-28 DIAGNOSIS — Z23 Encounter for immunization: Secondary | ICD-10-CM | POA: Diagnosis not present

## 2024-05-28 DIAGNOSIS — E876 Hypokalemia: Secondary | ICD-10-CM

## 2024-05-28 LAB — COMPREHENSIVE METABOLIC PANEL WITH GFR
ALT: 10 U/L (ref 0–53)
AST: 15 U/L (ref 0–37)
Albumin: 4.3 g/dL (ref 3.5–5.2)
Alkaline Phosphatase: 67 U/L (ref 39–117)
BUN: 12 mg/dL (ref 6–23)
CO2: 31 meq/L (ref 19–32)
Calcium: 9 mg/dL (ref 8.4–10.5)
Chloride: 100 meq/L (ref 96–112)
Creatinine, Ser: 0.95 mg/dL (ref 0.40–1.50)
GFR: 74.67 mL/min (ref >60.00)
Glucose, Bld: 100 mg/dL — ABNORMAL HIGH (ref 70–99)
Potassium: 3.1 meq/L — ABNORMAL LOW (ref 3.5–5.1)
Sodium: 141 meq/L (ref 135–145)
Total Bilirubin: 1.4 mg/dL — ABNORMAL HIGH (ref 0.2–1.2)
Total Protein: 6.9 g/dL (ref 6.0–8.3)

## 2024-05-28 LAB — BASIC METABOLIC PANEL WITH GFR
BUN: 12 mg/dL (ref 6–23)
CO2: 31 meq/L (ref 19–32)
Calcium: 9 mg/dL (ref 8.4–10.5)
Chloride: 100 meq/L (ref 96–112)
Creatinine, Ser: 0.95 mg/dL (ref 0.40–1.50)
GFR: 74.67 mL/min (ref >60.00)
Glucose, Bld: 100 mg/dL — ABNORMAL HIGH (ref 70–99)
Potassium: 3.1 meq/L — ABNORMAL LOW (ref 3.5–5.1)
Sodium: 141 meq/L (ref 135–145)

## 2024-05-28 LAB — CBC
HCT: 40.7 % (ref 39.0–52.0)
Hemoglobin: 13.5 g/dL (ref 13.0–17.0)
MCHC: 33.3 g/dL (ref 30.0–36.0)
MCV: 87.8 fl (ref 78.0–100.0)
Platelets: 270 K/uL (ref 150.0–400.0)
RBC: 4.63 Mil/uL (ref 4.22–5.81)
RDW: 13.7 % (ref 11.5–15.5)
WBC: 5.8 K/uL (ref 4.0–10.5)

## 2024-05-28 LAB — TSH: TSH: 1.07 u[IU]/mL (ref 0.35–5.50)

## 2024-05-28 LAB — MAGNESIUM: Magnesium: 2.1 mg/dL (ref 1.5–2.5)

## 2024-05-28 MED ORDER — POTASSIUM CHLORIDE CRYS ER 20 MEQ PO TBCR: 60.0000 meq | EXTENDED_RELEASE_TABLET | Freq: Three times a day (TID) | ORAL | 3 refills | Status: DC

## 2024-05-28 NOTE — Addendum Note (Signed)
 Addended by: WHEELER HARLENE CROME on: 05/28/2024 12:26 PM   Modules accepted: Orders

## 2024-05-30 ENCOUNTER — Ambulatory Visit: Payer: Self-pay | Admitting: Student

## 2024-05-30 ENCOUNTER — Other Ambulatory Visit (INDEPENDENT_AMBULATORY_CARE_PROVIDER_SITE_OTHER)

## 2024-05-30 DIAGNOSIS — E876 Hypokalemia: Secondary | ICD-10-CM

## 2024-05-30 DIAGNOSIS — R42 Dizziness and giddiness: Secondary | ICD-10-CM

## 2024-05-30 LAB — BASIC METABOLIC PANEL WITH GFR
BUN: 11 mg/dL (ref 6–23)
CO2: 30 meq/L (ref 19–32)
Calcium: 9.1 mg/dL (ref 8.4–10.5)
Chloride: 100 meq/L (ref 96–112)
Creatinine, Ser: 0.95 mg/dL (ref 0.40–1.50)
GFR: 74.66 mL/min (ref >60.00)
Glucose, Bld: 101 mg/dL — ABNORMAL HIGH (ref 70–99)
Potassium: 3.9 meq/L (ref 3.5–5.1)
Sodium: 138 meq/L (ref 135–145)

## 2024-06-04 ENCOUNTER — Telehealth: Payer: Self-pay | Admitting: Cardiovascular Disease

## 2024-06-04 NOTE — Telephone Encounter (Signed)
 STAT if patient feels like he/she is going to faint   1. Are you feeling dizzy, lightheaded, or faint right now? No, but pt did the past two days     2. Have you passed out?  No  (If yes move to .[facility])   3. Do you have any other symptoms? No    4. Have you checked your HR and BP (record if available)? HR 64   Pts wife called very concerned about dizziness her husband has been having. 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 He has seen his pcp and started a supplement for his low potassium, but wanted to f/u with cardiology. Please advise.

## 2024-06-04 NOTE — Telephone Encounter (Signed)
 Returned call to patient/wife regarding dizziness that is increasing in frequency, occurring in total for about 3 weeks. There is no room spinning. Episodes lasting seconds. 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 He notes dizziness when looking down, wears readers. Saw eye doctor a few weeks ago with good report.?Hydration - on lasix  and triamterene -hydrochlorothiazide . Has been working to increase water intake. Last echo 2023, no prior monitor.   Home BPs per report: 133/68, 141/87, 127/82  Denies palpations. Denies chest pain. Denies shortness of breath. Denies low blood sugar readings.   Saw PCP 10/7- added potassium. TSH, Mag, renal function WNL. Review of PCP note: Dizziness  Intermittent, brief dizziness likely due to dehydration. Blood pressure drops from 120/80 mmHg supine to 107/70 mmHg standing. Differential includes dehydration, medication effects, and orthostatic hypotension. - Increase water intake; Pt admits to not drinking enough water daily - Rise slowly from sitting or lying positions, Pt educated on orthostatic hypotension. - Update lab work to r/o anemia, thyroid , or electrolyte issues - If symptoms persist, consider reducing Lasix  dosage to 20 mg daily and decreasing potassium supplements, consult with Cardiology.  -If symptoms persist, RTC   Most recent PCP med changes: lasix  40mg  every day and K+ 40meq BID w/repeat labs planned.

## 2024-06-04 NOTE — Telephone Encounter (Signed)
 Verlin Lonni BIRCH, MD to Me  Velinda Avelina CROME, RN (Selected Message)   06/04/24  3:06 PM Agree that this sounds like orthostatic hypotension. Agree with increasing fluid intake and decreasing dose of Lasix  as recommended by primary care. Medford Sermon with patient's wife. Relayed message from MD. Advised that he check VS around time of dizziness if able, to see if there are trends. She voiced understanding.

## 2024-06-05 ENCOUNTER — Telehealth: Payer: Self-pay

## 2024-06-05 NOTE — Telephone Encounter (Signed)
 Copied from CRM #[id number]. Topic: General - Other >> Jun 05, 2024  9:09 AM Mia F wrote: Reason for CRM: Pt wife calling back stating she missed a call 15 minutes ago but no notes has been put in and CAL has no knowledge of this call. Please call pt back if still needed

## 2024-06-05 NOTE — Telephone Encounter (Signed)
 Chart reviewed, don't see where anyone called.

## 2024-06-06 NOTE — Telephone Encounter (Signed)
 Copied from CRM [id number]. Topic: General - Other >> Jun 06, 2024 11:45 AM Ahlexyia S wrote: Reason for CRM: Pt wife Erminio called in to speak to NP Harlene at clinic for a recent conversation her and the pt wife had. Per pt chart there has not been any communication with pt since the appointment that was scheduled for Oct 7th. Informed pt with this information but she would like for Harlene or someone in the clinic to give her a callback regarding this.

## 2024-06-06 NOTE — Telephone Encounter (Signed)
 Lvm for them to schedule

## 2024-06-07 ENCOUNTER — Other Ambulatory Visit: Payer: Self-pay | Admitting: Cardiovascular Disease

## 2024-06-07 DIAGNOSIS — I5033 Acute on chronic diastolic (congestive) heart failure: Secondary | ICD-10-CM

## 2024-06-16 NOTE — Progress Notes (Unsigned)
 No chief complaint on file.  History of Present Illness David Parsons is an 83 year old male with a history of heart valve replacement who presents with dizziness.  PMHx- HTN, OSA, Aortic valve disease- AVR, DM, CHF, CAD s/p CABG  Follows with cardiology Echo April 2023 with LVEF=50-55%. LVH. AVR working well.  Las echo 2023- WNL  CHF Takes Lasix  40 mg daily  HTN  triamterene -hydrochlorothiazide - 37.5-25, losartan  50 mg daily, carvedilol  25 mg BID,amlodipine  10 mg daily, hydrochlorothiazide     Past Medical History:  Diagnosis Date   Aortic insufficiency     02/16/09 Chest CT:  The aortic root has a diameter measuring 4.6 cm, image 64 of the coronal series.  The ascending    thoracic aorta has a diameter of 3.8 cm, image 65 of the coronal series.  At the level of the aortic arch    the thoracic aorta has a maximum diameter of 3.2 cm.  The descending thoracic aorta has a maximum           diameter of 3.2 cm.  There is no evidence for aortic disse         Aortic root enlargement    The aortic root has a diameter measuring 4.6 cm, image 64 of the coronal series.  The ascending  thoracic aorta has a diameter of 3.8 cm, image 65 of the coronal series.  At the level of the aortic arch   the thoracic aorta has a maximum diameter of 3.2 cm.  The descending thoracic aorta has a maximum   diameter of 3.2 cm.  There is no evidence for aortic dissection.        CAD (coronary artery disease)    Cardiomyopathy     01/2009 - Cath - nonobs. dzs.  EF 35%.   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   Biological Bentall Aortic Root Replacement using 25 mm Edwards Magna Ease pericardial tissue valve and 28 mm Vascutek Gelweave Valsalva conduit with reimplantation of left main and right coronary artery and CABG x1 using SVG to RCA   Sleep apnea    occasionally wears CPAP at night    Family History  Problem Relation Age of Onset   Breast cancer Sister    Diabetes Sister    Colon cancer Brother 93       at least 41   Diabetes Brother    Cancer Brother        colon, prostate   Prostate cancer Brother    Diabetes Brother    Diabetes Sister    Cancer Sister        breast, colon, melanoma   Heart disease Sister    Hypertension Mother    Arthritis Mother    Kidney failure Sister    Colon cancer Sister    Breast cancer Sister    Kidney disease Sister  s/p transplant   Hypertension Sister    Diabetes Sister    Stroke Father    Hypertension Father     Allergies as of 06/19/2024       Reactions   Latex Hives   Latex gloves        Medication List        Accurate as of June 16, 2024  2:30 PM. If you have any questions, ask your nurse or doctor.          amLODipine  10 MG tablet Commonly known as: NORVASC  Take 1 tablet (10 mg total) by mouth daily.   aspirin  EC 81 MG tablet Take 81 mg by mouth daily.   carvedilol  25 MG tablet Commonly known as: COREG  Take 1 tablet (25 mg total) by mouth 2 (two) times daily with a meal.   diazepam  5 MG tablet Commonly known as: VALIUM  TAKE 1/2-2 TABLETS BY MOUTH DAILY AS NEEDED FOR ANXIETY.   ferrous sulfate  325 (65 FE) MG tablet Take 1 tablet (325 mg total) by mouth every other day.   furosemide  40 MG tablet Commonly known as: LASIX  TAKE 1 TABLET BY MOUTH EVERY DAY   latanoprost 0.005 % ophthalmic solution Commonly known as: XALATAN 1 drop at bedtime.   losartan  50 MG tablet Commonly known as: COZAAR  TAKE  1 TABLET BY MOUTH EVERY DAY   multivitamin with minerals Tabs tablet Take 1 tablet by mouth daily.   omeprazole  40 MG capsule Commonly known as: PRILOSEC TAKE 1 CAPSULE (40 MG TOTAL) BY MOUTH DAILY.   potassium chloride  SA 20 MEQ tablet Commonly known as: Klor-Con  M20 Take 3 tablets (60 mEq total) by mouth 3 (three) times daily.   PROBIOTIC DAILY PO Take 1 tablet by mouth daily.   SYSTANE BALANCE OP Place 1 drop into both eyes in the morning and at bedtime. For dry eyes.   tamsulosin  0.4 MG Caps capsule Commonly known as: FLOMAX  Take 1 capsule (0.4 mg total) by mouth daily.   triamcinolone  ointment 0.1 % Commonly known as: KENALOG  SMARTSIG:1 Topical Daily   triamterene -hydrochlorothiazide  37.5-25 MG tablet Commonly known as: MAXZIDE-25 TAKE 1 TABLET BY MOUTH EVERY DAY   vitamin D3 50 MCG (2000 UT) Caps Take 1 capsule by mouth daily.        There were no vitals taken for this visit. General: Awake, alert, appears stated age Eyes: PERRLA, EOMi Ears: Patent, TM's neg b/l Heart: RRR, no murmurs, no carotid bruits Lungs: CTAB, no accessory muscle use MSK: 5/5 strength throughout, gait normal Neuro: No cerebellar signs, patellar reflex WNL Psych: Age appropriate judgment and insight, normal mood and affect  No diagnosis found.    Dizziness  Intermittent, brief dizziness. Likely r/t dehydration and orthostatic hypotension.  Cardiology agreeable to decreasing Lasix - will decrease to 20 mg daily.  - Rise slowly from sitting or lying positions, Pt educated on orthostatic hypotension. - If symptoms persist, consider reducing Lasix  dosage to 20 mg daily and decreasing potassium supplements, consult with Cardiology.  -If symptoms persist, consider vestibular PT  Heart failure with aortic valve replacement  Well-controlled blood pressure and heart rate. Lasix  contributing to frequent urination and potential dehydration. - Continue current medications. - Monitor for  signs of fluid retention, such as weight gain or swelling. - Report significant weight gain or shortness of breath   Orders as above. F/u prn. Pt voiced understanding and agreement to the plan.  Harlene LITTIE Jolly, DNP, AGNP-C 06/16/24 2:30 PM     Addendum: Both  a BMP and CMP were ordered in error. Only the CMP should be utilized and coded for this note and visit.- JLY 05/29/2024

## 2024-06-17 ENCOUNTER — Ambulatory Visit (INDEPENDENT_AMBULATORY_CARE_PROVIDER_SITE_OTHER): Admitting: Podiatry

## 2024-06-17 ENCOUNTER — Encounter: Payer: Self-pay | Admitting: Podiatry

## 2024-06-17 DIAGNOSIS — B351 Tinea unguium: Secondary | ICD-10-CM

## 2024-06-17 DIAGNOSIS — M79675 Pain in left toe(s): Secondary | ICD-10-CM

## 2024-06-17 DIAGNOSIS — M79674 Pain in right toe(s): Secondary | ICD-10-CM | POA: Diagnosis not present

## 2024-06-17 NOTE — Progress Notes (Signed)
  Subjective:  Patient ID: David Parsons, male    DOB: [birth year],  MRN: [id number]  David Parsons presents to clinic today for: painful mycotic toenails x 10 which interfere with daily activities. Pain is relieved with periodic professional debridement.  Chief Complaint  Patient presents with   RFC     RFC Non diabetic toenail trim. LOV with PCP 12/24/23.   PCP is David Harlene LABOR, David Parsons.  Allergies  Allergen Reactions   Latex Hives    Latex gloves     Review of Systems: Negative except as noted in the HPI.  Objective:  There were no vitals filed for this visit.  David Parsons is a pleasant 83 y.o. male obese in NAD. AAO x 3.  Vascular Examination: Capillary refill time <3 seconds b/l LE. Palpable pedal pulses b/l LE. Digital hair present b/l. No pedal edema b/l. Skin temperature gradient WNL b/l. No varicosities b/l. No cyanosis or clubbing. No ischemia or gangrene. .  Dermatological Examination: Pedal skin with normal turgor, texture and tone b/l. No open wounds. No interdigital macerations b/l. Toenails 1-5 b/l thickened, discolored, dystrophic with subungual debris. There is pain on palpation to dorsal aspect of nailplates.  No corns, calluses, nor porokeratotic lesions.  Neurological Examination: Protective sensation intact with 10 gram monofilament b/l LE. Vibratory sensation intact b/l LE.   Musculoskeletal Examination: Normal muscle strength 5/5 to all lower extremity muscle groups bilaterally. HAV with bunion bilaterally and hammertoes 2-5 b/l.SABRA No pain, crepitus or joint limitation noted with ROM b/l LE.  Patient ambulates independently without assistive aids.     Latest Ref Rng & Units 12/24/2023   10:54 AM 07/24/2023   11:07 AM  Hemoglobin A1C  Hemoglobin-A1c 4.6 - 6.5 % 5.2  5.2    Assessment/Plan: 1. Pain due to onychomycosis of toenails of both feet     Patient was evaluated and treated. All patient's and/or POA's questions/concerns addressed on today's  visit. Toenails 1-5 b/l debrided in length and girth without incident. Continue soft, supportive shoe gear daily. Report any pedal injuries to medical professional. Call office if there are any questions/concerns. -Patient/POA to call should there be question/concern in the interim.   Return in about 3 months (around 09/17/2024).  David Parsons, DPM       LOCATION: [address] [place] [address]                   Office [phone]   [facility] LOCATION: [address] [place], [place] [address] Office [phone]

## 2024-06-18 ENCOUNTER — Ambulatory Visit: Payer: Self-pay | Admitting: Student

## 2024-06-18 ENCOUNTER — Other Ambulatory Visit (INDEPENDENT_AMBULATORY_CARE_PROVIDER_SITE_OTHER)

## 2024-06-18 ENCOUNTER — Other Ambulatory Visit

## 2024-06-18 DIAGNOSIS — R42 Dizziness and giddiness: Secondary | ICD-10-CM

## 2024-06-18 LAB — COMPREHENSIVE METABOLIC PANEL WITH GFR
ALT: 9 U/L (ref 0–53)
AST: 14 U/L (ref 0–37)
Albumin: 4.1 g/dL (ref 3.5–5.2)
Alkaline Phosphatase: 72 U/L (ref 39–117)
BUN: 11 mg/dL (ref 6–23)
CO2: 34 meq/L — ABNORMAL HIGH (ref 19–32)
Calcium: 9.3 mg/dL (ref 8.4–10.5)
Chloride: 100 meq/L (ref 96–112)
Creatinine, Ser: 0.94 mg/dL (ref 0.40–1.50)
GFR: 75.59 mL/min (ref >60.00)
Glucose, Bld: 137 mg/dL — ABNORMAL HIGH (ref 70–99)
Potassium: 3.4 meq/L — ABNORMAL LOW (ref 3.5–5.1)
Sodium: 142 meq/L (ref 135–145)
Total Bilirubin: 1.5 mg/dL — ABNORMAL HIGH (ref 0.2–1.2)
Total Protein: 6.6 g/dL (ref 6.0–8.3)

## 2024-06-19 ENCOUNTER — Ambulatory Visit: Admitting: Student

## 2024-06-19 ENCOUNTER — Encounter: Payer: Self-pay | Admitting: Student

## 2024-06-19 VITALS — BP 139/80 | HR 70 | Ht 73.0 in | Wt 263.2 lb

## 2024-06-19 DIAGNOSIS — R42 Dizziness and giddiness: Secondary | ICD-10-CM | POA: Diagnosis not present

## 2024-06-19 DIAGNOSIS — Z7189 Other specified counseling: Secondary | ICD-10-CM

## 2024-06-19 DIAGNOSIS — I509 Heart failure, unspecified: Secondary | ICD-10-CM

## 2024-07-01 ENCOUNTER — Ambulatory Visit: Admitting: Family Medicine

## 2024-07-04 ENCOUNTER — Other Ambulatory Visit: Payer: Self-pay | Admitting: Family Medicine

## 2024-07-04 ENCOUNTER — Ambulatory Visit: Admitting: Student

## 2024-07-06 NOTE — Assessment & Plan Note (Signed)
 Well controlled, no changes to meds. Encouraged heart healthy diet such as the DASH diet and exercise as tolerated.

## 2024-07-06 NOTE — Assessment & Plan Note (Signed)
 Encourage heart healthy diet such as MIND or DASH diet, increase exercise, avoid trans fats, simple carbohydrates and processed foods, consider a krill or fish or flaxseed oil cap daily.

## 2024-07-06 NOTE — Assessment & Plan Note (Signed)
 Encouraged DASH or MIND diet, decrease po intake and increase exercise as tolerated. Needs 7-8 hours of sleep nightly. Avoid trans fats, eat small, frequent meals every 4-5 hours with lean proteins, complex carbs and healthy fats. Minimize simple carbs, high fat foods and processed foods

## 2024-07-06 NOTE — Assessment & Plan Note (Signed)
 No recent exacerbations.

## 2024-07-06 NOTE — Progress Notes (Unsigned)
 Subjective:    Patient ID: David Parsons, male    DOB: 14-Aug-1941, 83 y.o.   MRN: [id number]  No chief complaint on file.   HPI Discussed the use of AI scribe software for clinical note transcription with the patient, who gave verbal consent to proceed.  History of Present Illness David Parsons is an 83 year old male who presents for routine follow-up.  He has a history of prostate cancer for which he received treatment. His PSA levels have remained at zero, and he has no new urinary issues.  He has experienced significant weight loss from a high of 348 pounds in June 2019 to 261 pounds currently. He attributes this to dietary changes and increased water intake. He notes improved energy levels, better sleep, and a reduction in dizziness since increasing his water intake.  He is currently taking Lasix  (furosemide ) daily and potassium supplements, with a regimen of three tablets in the morning, two at lunchtime, and three at night. No palpitations, muscle cramps, diarrhea, or vomiting.  [facility], but it is not reflected in his medical chart. He is unsure about his pneumonia vaccination status but has received his COVID booster.  He mentions a family history of weight concerns, with his brother warning him about reaching 400 pounds. He has two grandsons and is actively involved in their lives, living close to his family.    Past Medical History:  Diagnosis Date   Aortic insufficiency     02/16/09 Chest CT:  The aortic root has a diameter measuring 4.6 cm, image 64 of the coronal series.  The ascending    thoracic aorta has a diameter of 3.8 cm, image 65 of the coronal series.  At the level of the aortic arch    the thoracic aorta has a maximum diameter of 3.2 cm.  The descending thoracic aorta has a maximum           diameter of 3.2 cm.  There is no evidence for aortic disse         Aortic root enlargement    The aortic root has a diameter  measuring 4.6 cm, image 64 of the coronal series.  The ascending  thoracic aorta has a diameter of 3.8 cm, image 65 of the coronal series.  At the level of the aortic arch   the thoracic aorta has a maximum diameter of 3.2 cm.  The descending thoracic aorta has a maximum   diameter of 3.2 cm.  There is no evidence for aortic dissection.        CAD (coronary artery disease)    Cardiomyopathy     01/2009 - Cath - nonobs. dzs.  EF 35%.   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   Biological Bentall Aortic Root Replacement using 25 mm Edwards Magna Ease pericardial tissue valve and 28 mm Vascutek Gelweave  Valsalva conduit with reimplantation of left main and right coronary artery and CABG x1 using SVG to RCA   Sleep apnea    occasionally wears CPAP at night    Past Surgical History:  Procedure Laterality Date   ADRENALECTOMY     BENTALL PROCEDURE  12/28/2011   Procedure: BENTALL PROCEDURE;  Surgeon: Sudie VEAR Laine, MD;  Location: [facility] OR;  Service: Open Heart Surgery;  Laterality: N/A;   CARDIAC CATHETERIZATION     2013   CARDIAC VALVE REPLACEMENT  12/28/2011   Bentall with tissue valve   CHOLECYSTECTOMY     COLONOSCOPY  09/25/2011   COLONOSCOPY W/ BIOPSIES AND POLYPECTOMY  11/04/2008   colon polyps, diverticulosis    CORONARY ARTERY BYPASS GRAFT  12/28/2011   Procedure: CORONARY ARTERY BYPASS GRAFTING (CABG);  Surgeon: Sudie VEAR Laine, MD;  Location: [facility] OR;  Service: Open Heart Surgery;   Laterality: N/A;  cabg x 1   LEFT AND RIGHT HEART CATHETERIZATION WITH CORONARY ANGIOGRAM N/A 11/22/2011   Procedure: LEFT AND RIGHT HEART CATHETERIZATION WITH CORONARY ANGIOGRAM;  Surgeon: Lonni JONETTA Cash, MD;  Location: [facility] CATH LAB;  Service: Cardiovascular;  Laterality: N/A;   MULTIPLE TOOTH EXTRACTIONS     PROSTATE BIOPSY Bilateral    TEE WITHOUT CARDIOVERSION  11/22/2011   Procedure: TRANSESOPHAGEAL ECHOCARDIOGRAM (TEE);  Surgeon: Ezra GORMAN Shuck, MD;  Location: [facility] ENDOSCOPY;  Service: Cardiovascular;  Laterality: N/A;   UPPER GASTROINTESTINAL ENDOSCOPY  11/04/2008   w/bx, H pylori gastritis   UPPER GASTROINTESTINAL ENDOSCOPY  09/25/2011    Family History  Problem Relation Age of Onset   Breast cancer Sister    Diabetes Sister    Colon cancer Brother 35       at least 32   Diabetes Brother    Cancer Brother        colon, prostate   Prostate cancer Brother    Diabetes Brother    Diabetes Sister    Cancer Sister        breast, colon, melanoma   Heart disease Sister    Hypertension Mother    Arthritis Mother    Kidney failure Sister    Colon cancer Sister    Breast cancer Sister    Kidney disease Sister        s/p transplant   Hypertension Sister    Diabetes Sister    Stroke Father    Hypertension Father     Social History   Socioeconomic History   Marital status: Married    Spouse name: Erminio   Number of children: 0   Years of education: Not on file   Highest education level: 12th grade  Occupational History    Employer: [employer]    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  He  lives a very sedentary lifestyle.  He is a nonsmoker.  He denies alcohol consumption.    Social Drivers of Corporate Investment Banker Strain: Low Risk  (  06/17/2024)   Overall Financial Resource Strain (CARDIA)    Difficulty of Paying Living Expenses: Not hard at all  Food Insecurity: No Food Insecurity (06/17/2024)   Hunger Vital Sign    Worried About Running Out of Food in the Last Year: Never true    Ran Out of Food in the Last Year: Never true  Transportation Needs: No Transportation Needs (06/17/2024)   PRAPARE - Administrator, Civil Service (Medical): No    Lack of Transportation (Non-Medical): No  Physical Activity: Inactive (05/26/2024)   Exercise Vital Sign    Days of Exercise per Week: 0 days    Minutes of Exercise per Session: Not on file  Stress: No Stress Concern Present (05/26/2024)   [employer] of Occupational Health - Occupational Stress Questionnaire    Feeling of Stress: Only a little  Social Connections: Moderately Integrated (06/17/2024)   Social Connection and Isolation Panel    Frequency of Communication with Friends and Family: More than three times a week    Frequency of Social Gatherings with Friends and Family: Three times a week    Attends Religious Services: More than 4 times per year    Active Member of Clubs or Organizations: No    Attends Banker Meetings: Not on file    Marital Status: Married  Intimate Partner Violence: Not At Risk (01/29/2024)   Humiliation, Afraid, Rape, and Kick questionnaire    Fear of Current or Ex-Partner: No    Emotionally Abused: No    Physically Abused: No    Sexually Abused: No    Outpatient Medications Prior to Visit  Medication Sig Dispense Refill   amLODipine  (NORVASC ) 10 MG tablet Take 1 tablet (10 mg total) by mouth daily. 90 tablet 0   aspirin  EC 81 MG tablet Take 81 mg by mouth daily.     carvedilol  (COREG )  25 MG tablet Take 1 tablet (25 mg total) by mouth 2 (two) times daily with a meal. 180 tablet 0   Cholecalciferol (VITAMIN D3) 50 MCG (2000 UT) CAPS Take 1 capsule by mouth daily.     diazepam  (VALIUM ) 5 MG tablet TAKE 1/2-2 TABLETS BY MOUTH DAILY AS NEEDED FOR ANXIETY. 10 tablet 0   ferrous sulfate  325 (65 FE) MG tablet Take 1 tablet (325 mg total) by mouth every other day.     furosemide  (LASIX ) 40 MG tablet TAKE 1 TABLET BY MOUTH EVERY DAY 90 tablet 3   latanoprost (XALATAN) 0.005 % ophthalmic solution 1 drop at bedtime.     losartan  (COZAAR ) 50 MG tablet TAKE 1 TABLET BY MOUTH EVERY DAY 90 tablet 1   Multiple Vitamin (MULITIVITAMIN WITH MINERALS) TABS Take 1 tablet by mouth daily.     omeprazole  (PRILOSEC) 40 MG capsule TAKE 1 CAPSULE (40 MG TOTAL) BY MOUTH DAILY. 90 capsule 3   potassium chloride  SA (KLOR-CON  M20) 20 MEQ tablet Take 3 tablets (60 mEq total) by mouth 3 (three) times daily. 270 tablet 3   Probiotic Product (PROBIOTIC DAILY PO) Take 1 tablet by mouth daily.     Propylene Glycol (SYSTANE BALANCE OP) Place 1 drop into both eyes in the morning and at bedtime. For dry eyes.     tamsulosin  (FLOMAX ) 0.4 MG CAPS capsule Take 1 capsule (0.4 mg total) by mouth daily. 30 capsule 3   triamcinolone  ointment (KENALOG ) 0.1 % SMARTSIG:1 Topical Daily     triamterene -hydrochlorothiazide  (MAXZIDE-25) 37.5-25 MG tablet TAKE 1 TABLET BY MOUTH EVERY DAY 90 tablet  2   No facility-administered medications prior to visit.    Allergies  Allergen Reactions   Latex Hives    Latex gloves     Review of Systems  Constitutional:  Negative for fever and malaise/fatigue.  HENT:  Negative for congestion.   Eyes:  Negative for blurred vision.  Respiratory:  Negative for shortness of breath.   Cardiovascular:  Negative for chest pain, palpitations and leg swelling.  Gastrointestinal:  Negative for abdominal pain, blood in stool and nausea.  Genitourinary:  Negative for dysuria and frequency.   Musculoskeletal:  Negative for falls.  Skin:  Negative for rash.  Neurological:  Negative for dizziness, loss of consciousness and headaches.  Endo/Heme/Allergies:  Negative for environmental allergies.  Psychiatric/Behavioral:  Negative for depression. The patient is not nervous/anxious.        Objective:    Physical Exam Vitals reviewed.  Constitutional:      Appearance: Normal appearance. He is not ill-appearing.  HENT:     Head: Normocephalic and atraumatic.     Nose: Nose normal.  Eyes:     Conjunctiva/sclera: Conjunctivae normal.  Cardiovascular:     Rate and Rhythm: Normal rate.     Pulses: Normal pulses.     Heart sounds: Normal heart sounds. No murmur heard. Pulmonary:     Effort: Pulmonary effort is normal.     Breath sounds: Normal breath sounds. No wheezing.  Abdominal:     Palpations: Abdomen is soft. There is no mass.     Tenderness: There is no abdominal tenderness.  Musculoskeletal:     Cervical back: Normal range of motion.     Right lower leg: No edema.     Left lower leg: No edema.  Skin:    General: Skin is warm and dry.  Neurological:     General: No focal deficit present.     Mental Status: He is alert and oriented to person, place, and time.  Psychiatric:        Mood and Affect: Mood normal.    There were no vitals taken for this visit. Wt Readings from Last 3 Encounters:  06/19/24 263 lb 3.2 oz (119.4 kg)  05/27/24 260 lb 6.4 oz (118.1 kg)  03/27/24 264 lb 3.2 oz (119.8 kg)    Diabetic Foot Exam - Simple   No data filed    Lab Results  Component Value Date   WBC 5.8 05/27/2024   HGB 13.5 05/27/2024   HCT 40.7 05/27/2024   PLT 270.0 05/27/2024   GLUCOSE 137 (H) 06/18/2024   CHOL 182 12/24/2023   TRIG 124.0 12/24/2023   HDL 37.10 (L) 12/24/2023   LDLCALC 120 (H) 12/24/2023   ALT 9 06/18/2024   AST 14 06/18/2024   NA 142 06/18/2024   K 3.4 (L) 06/18/2024   CL 100 06/18/2024   CREATININE 0.94 06/18/2024   BUN 11 06/18/2024    CO2 34 (H) 06/18/2024   TSH 1.07 05/27/2024   PSA 23.37 (H) 05/10/2020   INR 1.21 01/02/2012   HGBA1C 5.2 12/24/2023    Lab Results  Component Value Date   TSH 1.07 05/27/2024   Lab Results  Component Value Date   WBC 5.8 05/27/2024   HGB 13.5 05/27/2024   HCT 40.7 05/27/2024   MCV 87.8 05/27/2024   PLT 270.0 05/27/2024   Lab Results  Component Value Date   NA 142 06/18/2024   K 3.4 (L) 06/18/2024   CO2 34 (H) 06/18/2024   GLUCOSE 137 (H)  06/18/2024   BUN 11 06/18/2024   CREATININE 0.94 06/18/2024   BILITOT 1.5 (H) 06/18/2024   ALKPHOS 72 06/18/2024   AST 14 06/18/2024   ALT 9 06/18/2024   PROT 6.6 06/18/2024   ALBUMIN  4.1 06/18/2024   CALCIUM  9.3 06/18/2024   GFR 75.59 06/18/2024   Lab Results  Component Value Date   CHOL 182 12/24/2023   Lab Results  Component Value Date   HDL 37.10 (L) 12/24/2023   Lab Results  Component Value Date   LDLCALC 120 (H) 12/24/2023   Lab Results  Component Value Date   TRIG 124.0 12/24/2023   Lab Results  Component Value Date   CHOLHDL 5 12/24/2023   Lab Results  Component Value Date   HGBA1C 5.2 12/24/2023       Assessment & Plan:  Congestive heart failure, unspecified HF chronicity, unspecified heart failure type (HCC) Assessment & Plan: No recent exacerbations.    Essential hypertension Assessment & Plan: Well controlled, no changes to meds. Encouraged heart healthy diet such as the DASH diet and exercise as tolerated.     Dyslipidemia Assessment & Plan: Encourage heart healthy diet such as MIND or DASH diet, increase exercise, avoid trans fats, simple carbohydrates and processed foods, consider a krill or fish or flaxseed oil cap daily.     Obesity (BMI 30-39.9) Assessment & Plan: Encouraged DASH or MIND diet, decrease po intake and increase exercise as tolerated. Needs 7-8 hours of sleep nightly. Avoid trans fats, eat small, frequent meals every 4-5 hours with lean proteins, complex carbs and healthy  fats. Minimize simple carbs, high fat foods and processed foods    Hyperglycemia Assessment & Plan: hgba1c acceptable, minimize simple carbs. Increase exercise as tolerated.     Assessment and Plan Assessment & Plan Congestive heart failure and hypertension Blood pressure and pulse are well-controlled. Significant weight loss may impact diuretic therapy. - Will consult cardiology before adjusting Lasix  dosage.  Morbid obesity, status post significant weight loss Significant weight loss from 348 lbs to 261 lbs, improving energy levels and sleep quality. - Continue current lifestyle modifications including hydration and protein intake.  Hypokalemia secondary to diuretic therapy Potassium levels are low without symptoms such as palpitations or muscle cramps. Current potassium supplementation is 3 tablets in the morning, 2 at lunchtime, and 3 at night. - Rechecked potassium levels today. - Will consider adjusting potassium supplementation to 3 tablets three times a day if levels remain low.  Dyslipidemia  General Health Maintenance Up to date with flu and COVID vaccinations. Needs RSV and Prevnar 20 vaccines to reduce pneumonia risk. - Administer RSV vaccine. - Administer Prevnar 20 vaccine.  Recording duration: 12 minutes     Harlene Horton, MD

## 2024-07-06 NOTE — Assessment & Plan Note (Signed)
 hgba1c acceptable, minimize simple carbs. Increase exercise as tolerated.

## 2024-07-07 ENCOUNTER — Encounter: Payer: Self-pay | Admitting: Family Medicine

## 2024-07-07 ENCOUNTER — Ambulatory Visit (INDEPENDENT_AMBULATORY_CARE_PROVIDER_SITE_OTHER): Admitting: Family Medicine

## 2024-07-07 VITALS — BP 128/68 | HR 66 | Temp 98.7°F | Resp 16 | Ht 73.0 in | Wt 261.4 lb

## 2024-07-07 DIAGNOSIS — R739 Hyperglycemia, unspecified: Secondary | ICD-10-CM

## 2024-07-07 DIAGNOSIS — I10 Essential (primary) hypertension: Secondary | ICD-10-CM | POA: Diagnosis not present

## 2024-07-07 DIAGNOSIS — E669 Obesity, unspecified: Secondary | ICD-10-CM | POA: Diagnosis not present

## 2024-07-07 DIAGNOSIS — I509 Heart failure, unspecified: Secondary | ICD-10-CM

## 2024-07-07 DIAGNOSIS — Z79899 Other long term (current) drug therapy: Secondary | ICD-10-CM | POA: Diagnosis not present

## 2024-07-07 DIAGNOSIS — E785 Hyperlipidemia, unspecified: Secondary | ICD-10-CM | POA: Diagnosis not present

## 2024-07-07 LAB — CBC WITH DIFFERENTIAL/PLATELET
Basophils Absolute: 0.1 K/uL (ref 0.0–0.1)
Basophils Relative: 1.9 % (ref 0.0–3.0)
Eosinophils Absolute: 0.2 K/uL (ref 0.0–0.7)
Eosinophils Relative: 3.9 % (ref 0.0–5.0)
HCT: 39.3 % (ref 39.0–52.0)
Hemoglobin: 13.1 g/dL (ref 13.0–17.0)
Lymphocytes Relative: 20.5 % (ref 12.0–46.0)
Lymphs Abs: 1.2 K/uL (ref 0.7–4.0)
MCHC: 33.3 g/dL (ref 30.0–36.0)
MCV: 88.2 fl (ref 78.0–100.0)
Monocytes Absolute: 0.6 K/uL (ref 0.1–1.0)
Monocytes Relative: 10.8 % (ref 3.0–12.0)
Neutro Abs: 3.7 K/uL (ref 1.4–7.7)
Neutrophils Relative %: 62.9 % (ref 43.0–77.0)
Platelets: 260 K/uL (ref 150.0–400.0)
RBC: 4.45 Mil/uL (ref 4.22–5.81)
RDW: 13.5 % (ref 11.5–15.5)
WBC: 5.9 K/uL (ref 4.0–10.5)

## 2024-07-07 LAB — COMPREHENSIVE METABOLIC PANEL WITH GFR
ALT: 9 U/L (ref 0–53)
AST: 15 U/L (ref 0–37)
Albumin: 4.1 g/dL (ref 3.5–5.2)
Alkaline Phosphatase: 60 U/L (ref 39–117)
BUN: 13 mg/dL (ref 6–23)
CO2: 31 meq/L (ref 19–32)
Calcium: 9.5 mg/dL (ref 8.4–10.5)
Chloride: 100 meq/L (ref 96–112)
Creatinine, Ser: 0.92 mg/dL (ref 0.40–1.50)
GFR: 77.54 mL/min (ref >60.00)
Glucose, Bld: 103 mg/dL — ABNORMAL HIGH (ref 70–99)
Potassium: 3.7 meq/L (ref 3.5–5.1)
Sodium: 141 meq/L (ref 135–145)
Total Bilirubin: 1.2 mg/dL (ref 0.2–1.2)
Total Protein: 6.5 g/dL (ref 6.0–8.3)

## 2024-07-07 LAB — LIPID PANEL
Cholesterol: 169 mg/dL (ref 0–200)
HDL: 38.7 mg/dL — ABNORMAL LOW (ref >39.00)
LDL Cholesterol: 108 mg/dL — ABNORMAL HIGH (ref 0–99)
NonHDL: 130.74
Total CHOL/HDL Ratio: 4
Triglycerides: 116 mg/dL (ref 0.0–149.0)
VLDL: 23.2 mg/dL (ref 0.0–40.0)

## 2024-07-07 NOTE — Patient Instructions (Signed)
 Prevnar 20 vaccine RSV, Respiratory Syncitial Virus Vaccine, Arexvy vaccine

## 2024-07-08 ENCOUNTER — Telehealth: Payer: Self-pay | Admitting: *Deleted

## 2024-07-08 ENCOUNTER — Ambulatory Visit: Payer: Self-pay | Admitting: Family Medicine

## 2024-07-08 LAB — HEMOGLOBIN A1C: Hgb A1c MFr Bld: 4.9 % (ref 4.6–6.5)

## 2024-07-08 NOTE — Telephone Encounter (Unsigned)
 Copied from CRM #[id number]. Topic: Clinical - Lab/Test Results >> Jul 08, 2024  1:56 PM Alfonso HERO wrote: Reason for CRM: patient calling wanting to speak to the nurse about results. Even though he has seen them on mychart he would prefer to speak to someone.

## 2024-07-08 NOTE — Progress Notes (Signed)
 Called pt and no answer left vm to return call.

## 2024-07-09 LAB — DRUG MONITORING PANEL 376104, URINE
Alphahydroxyalprazolam: NEGATIVE ng/mL (ref <25)
Alphahydroxymidazolam: NEGATIVE ng/mL (ref <50)
Alphahydroxytriazolam: NEGATIVE ng/mL (ref <50)
Aminoclonazepam: NEGATIVE ng/mL (ref <25)
Amphetamines: NEGATIVE ng/mL (ref <500)
Barbiturates: NEGATIVE ng/mL (ref <300)
Benzodiazepines: NEGATIVE ng/mL (ref <100)
Cocaine Metabolite: NEGATIVE ng/mL (ref <150)
Desmethyltramadol: NEGATIVE ng/mL (ref <100)
Hydroxyethylflurazepam: NEGATIVE ng/mL (ref <50)
Lorazepam: NEGATIVE ng/mL (ref <50)
Nordiazepam: NEGATIVE ng/mL (ref <50)
Opiates: NEGATIVE ng/mL (ref <100)
Oxazepam: NEGATIVE ng/mL (ref <50)
Oxycodone: NEGATIVE ng/mL (ref <100)
Temazepam: NEGATIVE ng/mL (ref <50)
Tramadol: NEGATIVE ng/mL (ref <100)

## 2024-07-09 LAB — DM TEMPLATE

## 2024-07-09 NOTE — Telephone Encounter (Signed)
 Patient's call was returned and he verbalized understanding.

## 2024-07-21 ENCOUNTER — Telehealth: Payer: Self-pay | Admitting: Family Medicine

## 2024-07-21 NOTE — Telephone Encounter (Unsigned)
 Copied from CRM #[id number]. 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

## 2024-07-21 NOTE — Telephone Encounter (Signed)
 Patient is wanting another review of his latest labs and confirmation of findings. Thanks

## 2024-07-23 NOTE — Telephone Encounter (Signed)
 Left detailed message on machine that labs were normal and note is being corrected.

## 2024-07-30 ENCOUNTER — Other Ambulatory Visit: Payer: Self-pay | Admitting: Family Medicine

## 2024-08-22 ENCOUNTER — Other Ambulatory Visit: Payer: Self-pay | Admitting: Family Medicine

## 2024-09-03 ENCOUNTER — Other Ambulatory Visit: Payer: Self-pay | Admitting: Family Medicine

## 2024-09-03 DIAGNOSIS — F410 Panic disorder [episodic paroxysmal anxiety] without agoraphobia: Secondary | ICD-10-CM

## 2024-09-03 MED ORDER — DIAZEPAM 5 MG PO TABS: ORAL_TABLET | ORAL | 0 refills | Status: AC

## 2024-09-03 NOTE — Telephone Encounter (Signed)
 Copied from CRM [id number]. Topic: Clinical - Medication Refill >> Sep 03, 2024  1:26 PM Deleta RAMAN wrote: Medication: diazepam  (VALIUM ) 5 MG tablet  Has the patient contacted their pharmacy? Yes (Agent: If no, request that the patient contact the pharmacy for the refill. If patient does not wish to contact the pharmacy document the reason why and proceed with request.) (Agent: If yes, when and what did the pharmacy advise?)  This is the patient's preferred pharmacy:  [phone] [place] [place] - [address] [place] [address] Phone: [phone] Fax: [phone]  Is this the correct pharmacy for this prescription? Yes If no, delete pharmacy and type the correct one.   Has the prescription been filled recently? Yes  Is the patient out of the medication? No  Has the patient been seen for an appointment in the last year OR does the patient have an upcoming appointment? Yes  Can we respond through MyChart? Yes  Agent: Please be advised that Rx refills may take up to 3 business days. We ask that you follow-up with your pharmacy.

## 2024-09-03 NOTE — Telephone Encounter (Signed)
 Requesting: diazepam  5mg  Contract: 01/08/24 UDS: 07/07/24 Last Visit: 07/07/24 Next Visit: None Last Refill: 05/16/24 #10 and 0RF   Please Advise

## 2024-09-17 ENCOUNTER — Other Ambulatory Visit: Payer: Self-pay | Admitting: Family Medicine

## 2024-09-30 ENCOUNTER — Ambulatory Visit: Admitting: Podiatry

## 2025-02-03 ENCOUNTER — Ambulatory Visit
